# Patient Record
Sex: Female | Born: 1950
Health system: Southern US, Community
[De-identification: ages and names within clinical notes are randomized; demographics above are authoritative.]

## PROBLEM LIST (undated history)

## (undated) DIAGNOSIS — G709 Myoneural disorder, unspecified: Secondary | ICD-10-CM

## (undated) DIAGNOSIS — G629 Polyneuropathy, unspecified: Secondary | ICD-10-CM

## (undated) DIAGNOSIS — Z1211 Encounter for screening for malignant neoplasm of colon: Secondary | ICD-10-CM

## (undated) DIAGNOSIS — C801 Malignant (primary) neoplasm, unspecified: Secondary | ICD-10-CM

## (undated) DIAGNOSIS — L570 Actinic keratosis: Secondary | ICD-10-CM

## (undated) DIAGNOSIS — E538 Deficiency of other specified B group vitamins: Secondary | ICD-10-CM

## (undated) DIAGNOSIS — G4730 Sleep apnea, unspecified: Secondary | ICD-10-CM

## (undated) DIAGNOSIS — G2581 Restless legs syndrome: Secondary | ICD-10-CM

## (undated) DIAGNOSIS — H269 Unspecified cataract: Secondary | ICD-10-CM

## (undated) DIAGNOSIS — D649 Anemia, unspecified: Secondary | ICD-10-CM

## (undated) DIAGNOSIS — Z5189 Encounter for other specified aftercare: Secondary | ICD-10-CM

## (undated) DIAGNOSIS — K2210 Ulcer of esophagus without bleeding: Secondary | ICD-10-CM

## (undated) DIAGNOSIS — Z8639 Personal history of other endocrine, nutritional and metabolic disease: Secondary | ICD-10-CM

## (undated) DIAGNOSIS — E119 Type 2 diabetes mellitus without complications: Secondary | ICD-10-CM

## (undated) DIAGNOSIS — Z800 Family history of malignant neoplasm of digestive organs: Secondary | ICD-10-CM

## (undated) DIAGNOSIS — Z87891 Personal history of nicotine dependence: Secondary | ICD-10-CM

## (undated) DIAGNOSIS — G35D Multiple sclerosis, unspecified: Secondary | ICD-10-CM

## (undated) DIAGNOSIS — E669 Obesity, unspecified: Secondary | ICD-10-CM

## (undated) DIAGNOSIS — M1990 Unspecified osteoarthritis, unspecified site: Secondary | ICD-10-CM

## (undated) DIAGNOSIS — G35 Multiple sclerosis: Secondary | ICD-10-CM

## (undated) DIAGNOSIS — K219 Gastro-esophageal reflux disease without esophagitis: Secondary | ICD-10-CM

## (undated) DIAGNOSIS — R000 Tachycardia, unspecified: Secondary | ICD-10-CM

## (undated) DIAGNOSIS — N6019 Diffuse cystic mastopathy of unspecified breast: Secondary | ICD-10-CM

## (undated) DIAGNOSIS — I10 Essential (primary) hypertension: Secondary | ICD-10-CM

## (undated) DIAGNOSIS — S42409A Unspecified fracture of lower end of unspecified humerus, initial encounter for closed fracture: Secondary | ICD-10-CM

## (undated) HISTORY — DX: Diffuse cystic mastopathy of unspecified breast: N60.19

## (undated) HISTORY — DX: Essential (primary) hypertension: I10

## (undated) HISTORY — DX: Personal history of nicotine dependence: Z87.891

## (undated) HISTORY — DX: Obesity, unspecified: E66.9

## (undated) HISTORY — DX: Encounter for other specified aftercare: Z51.89

## (undated) HISTORY — DX: Encounter for screening for malignant neoplasm of colon: Z12.11

## (undated) HISTORY — DX: Multiple sclerosis: G35

## (undated) HISTORY — PX: BREAST BIOPSY: SHX20

## (undated) HISTORY — DX: Deficiency of other specified B group vitamins: E53.8

## (undated) HISTORY — DX: Unspecified cataract: H26.9

## (undated) HISTORY — PX: TUBAL LIGATION: SHX77

## (undated) HISTORY — PX: PORTA CATH REMOVAL: CATH118286

## (undated) HISTORY — DX: Multiple sclerosis, unspecified: G35.D

## (undated) HISTORY — DX: Unspecified fracture of lower end of unspecified humerus, initial encounter for closed fracture: S42.409A

## (undated) HISTORY — DX: Unspecified osteoarthritis, unspecified site: M19.90

## (undated) HISTORY — DX: Actinic keratosis: L57.0

## (undated) HISTORY — DX: Type 2 diabetes mellitus without complications: E11.9

## (undated) HISTORY — PX: CHOLECYSTECTOMY: SHX55

## (undated) HISTORY — DX: Family history of malignant neoplasm of digestive organs: Z80.0

## (undated) HISTORY — DX: Malignant (primary) neoplasm, unspecified: C80.1

## (undated) HISTORY — DX: Gastro-esophageal reflux disease without esophagitis: K21.9

## (undated) HISTORY — PX: SKIN BIOPSY: SHX1

---

## 1981-11-23 HISTORY — PX: DILATION AND CURETTAGE OF UTERUS: SHX78

## 1987-11-24 HISTORY — PX: POLYPECTOMY: SHX149

## 2004-11-23 HISTORY — PX: WISDOM TOOTH EXTRACTION: SHX21

## 2005-08-18 ENCOUNTER — Ambulatory Visit: Payer: Self-pay | Admitting: General Surgery

## 2006-08-26 ENCOUNTER — Ambulatory Visit: Payer: Self-pay | Admitting: General Surgery

## 2007-08-31 ENCOUNTER — Ambulatory Visit: Payer: Self-pay | Admitting: General Surgery

## 2008-09-04 ENCOUNTER — Ambulatory Visit: Payer: Self-pay | Admitting: General Surgery

## 2008-11-23 HISTORY — PX: COLONOSCOPY: SHX174

## 2009-02-01 ENCOUNTER — Ambulatory Visit: Payer: Self-pay | Admitting: General Surgery

## 2009-09-10 ENCOUNTER — Ambulatory Visit: Payer: Self-pay | Admitting: General Surgery

## 2010-09-16 ENCOUNTER — Ambulatory Visit: Payer: Self-pay | Admitting: General Surgery

## 2010-11-23 DIAGNOSIS — Z800 Family history of malignant neoplasm of digestive organs: Secondary | ICD-10-CM

## 2010-11-23 HISTORY — DX: Family history of malignant neoplasm of digestive organs: Z80.0

## 2011-09-23 ENCOUNTER — Ambulatory Visit: Payer: Self-pay | Admitting: General Surgery

## 2012-09-27 ENCOUNTER — Ambulatory Visit: Payer: Self-pay | Admitting: General Surgery

## 2012-11-23 DIAGNOSIS — C801 Malignant (primary) neoplasm, unspecified: Secondary | ICD-10-CM

## 2012-11-23 HISTORY — DX: Malignant (primary) neoplasm, unspecified: C80.1

## 2013-05-10 ENCOUNTER — Encounter: Payer: Self-pay | Admitting: *Deleted

## 2013-05-10 DIAGNOSIS — Z800 Family history of malignant neoplasm of digestive organs: Secondary | ICD-10-CM | POA: Insufficient documentation

## 2013-09-28 ENCOUNTER — Ambulatory Visit: Payer: Self-pay | Admitting: General Surgery

## 2013-09-29 ENCOUNTER — Encounter: Payer: Self-pay | Admitting: General Surgery

## 2013-09-29 NOTE — Progress Notes (Signed)
Quick Note:  Make sure the additional views are done prior to her office visit ______ 

## 2013-10-09 ENCOUNTER — Telehealth: Payer: Self-pay | Admitting: *Deleted

## 2013-10-09 NOTE — Telephone Encounter (Signed)
Message left on home and cell numbers to call the office. We also tried to reach patient at work but they state she has this week off from work.   Patient's appointment needs to be moved after she has her additional views completed at [facility]. Additional views are presently scheduled for 10-30-13.

## 2013-10-11 ENCOUNTER — Telehealth: Payer: Self-pay | Admitting: *Deleted

## 2013-10-11 NOTE — Telephone Encounter (Signed)
Message left again on home and cell numbers for patient to call the office.

## 2013-10-16 ENCOUNTER — Ambulatory Visit: Payer: Self-pay | Admitting: General Surgery

## 2013-10-30 ENCOUNTER — Ambulatory Visit: Payer: Self-pay | Admitting: General Surgery

## 2013-10-30 ENCOUNTER — Encounter: Payer: Self-pay | Admitting: General Surgery

## 2013-11-07 ENCOUNTER — Ambulatory Visit (INDEPENDENT_AMBULATORY_CARE_PROVIDER_SITE_OTHER): Payer: 59 | Admitting: General Surgery

## 2013-11-07 ENCOUNTER — Other Ambulatory Visit: Payer: Self-pay | Admitting: General Surgery

## 2013-11-07 ENCOUNTER — Other Ambulatory Visit: Payer: 59

## 2013-11-07 ENCOUNTER — Encounter: Payer: Self-pay | Admitting: General Surgery

## 2013-11-07 VITALS — BP 116/70 | HR 76 | Resp 14 | Ht 64.5 in | Wt 184.0 lb

## 2013-11-07 DIAGNOSIS — N63 Unspecified lump in unspecified breast: Secondary | ICD-10-CM

## 2013-11-07 DIAGNOSIS — R920 Mammographic microcalcification found on diagnostic imaging of breast: Secondary | ICD-10-CM

## 2013-11-07 HISTORY — PX: BREAST BIOPSY: SHX20

## 2013-11-07 NOTE — Progress Notes (Signed)
Patient ID: Cassidy Norris, female   DOB: 11/14/1951, 62 y.o.   MRN: [id number]  Chief Complaint  Patient presents with  . Follow-up    mammogram    HPI Cassidy Norris is a 62 y.o. female.  who presents for her follow up breast evaluation. The most recent mammogram and ultrasound was done on 10-30-13. Patient does perform regular self breast checks and gets regular mammograms done. The patient denies any problems with the breasts.    HPI  Past Medical History  Diagnosis Date  . Personal history of tobacco use, presenting hazards to health   . Multiple sclerosis   . Hypertension   . Diffuse cystic mastopathy   . Family history of malignant neoplasm of gastrointestinal tract 2012  . Obesity, unspecified   . Special screening for malignant neoplasms, colon     Past Surgical History  Procedure Laterality Date  . Breast biopsy Right 1973,2001  . Wisdom tooth extraction  2006  . Polypectomy  1989  . Dilation and curettage of uterus  1983  . Colonoscopy  2010    Dr. Evette Cristal  . Tubal ligation      Family History  Problem Relation Age of Onset  . Cancer Mother     lung  . Cancer Father     colon    Social History History  Substance Use Topics  . Smoking status: Current Every Day Smoker -- 1.00 packs/day for 30 years    Types: Cigarettes  . Smokeless tobacco: Not on file  . Alcohol Use: No    No Known Allergies  Current Outpatient Prescriptions  Medication Sig Dispense Refill  . diphenhydrAMINE (SOMINEX) 25 MG tablet Take 25 mg by mouth at bedtime as needed for sleep.      . Melatonin 3 MG TABS Take by mouth daily.      . traZODone (DESYREL) 50 MG tablet Take 50 mg by mouth at bedtime.       No current facility-administered medications for this visit.    Review of Systems Review of Systems  Constitutional: Negative.   Respiratory: Negative.   Cardiovascular: Negative.     Blood pressure 116/70, pulse 76, resp. rate 14, height 5' 4.5" (1.638 m), weight  184 lb (83.462 kg).  Physical Exam Physical Exam  Constitutional: She is oriented to person, place, and time. She appears well-developed and well-nourished.  Eyes: Conjunctivae are normal. No scleral icterus.  Neck: No thyromegaly present.  Cardiovascular: Normal rate, regular rhythm and normal heart sounds.   No murmur heard. Pulmonary/Chest: Effort normal and breath sounds normal. Right breast exhibits no inverted nipple, no mass, no nipple discharge, no skin change and no tenderness. Left breast exhibits no inverted nipple, no mass, no nipple discharge, no skin change and no tenderness.  Right breast focal 1cm ill defined thickening at 10 o'clock near the periphery.   Lymphadenopathy:    She has no cervical adenopathy.    She has no axillary adenopathy.  Neurological: She is alert and oriented to person, place, and time.  Skin: Skin is warm and dry.    Data Reviewed Mammogram shows cluster of calcifications in left breast at 6 ocl and in uoq- appear they are stable from before. Also a ned faint nodule seen lateral right breast-US shows a vagu=ue spiculated area with some shadowing. This seems to correspond with the thickening palpated in right breast today.  Assessment    Findings as noted above. Core biopsy of right breast mass recommended and  completed today.     Plan    Await path report.        Jams Trickett G 11/07/2013, 7:37 PM

## 2013-11-07 NOTE — Patient Instructions (Signed)

## 2013-11-10 ENCOUNTER — Telehealth: Payer: Self-pay | Admitting: *Deleted

## 2013-11-10 DIAGNOSIS — R920 Mammographic microcalcification found on diagnostic imaging of breast: Secondary | ICD-10-CM

## 2013-11-10 NOTE — Telephone Encounter (Signed)
Patient has been scheduled for a left breast stereotactic biopsy x 2 at [facility] for 11-27-13 at 3 pm. She will check-in at the [facility] at 2:30 pm. This patient is aware of date, time, and instructions. Patient verbalizes understanding.

## 2013-11-10 NOTE — Telephone Encounter (Signed)
Message copied by Nicholes Mango on Fri Nov 10, 2013 11:58 AM ------      Message from: Kieth Brightly      Created: Fri Nov 10, 2013 11:21 AM       Pt advised of path report by phone this am. Briefly explained nature of cancer, pending reports on ER/PR and her 2 neu.       Feel she needs to have left breast microcalcs   biopsied. Pt is agreeable. ------

## 2013-11-17 LAB — PATHOLOGY

## 2013-11-21 ENCOUNTER — Ambulatory Visit (INDEPENDENT_AMBULATORY_CARE_PROVIDER_SITE_OTHER): Payer: 59 | Admitting: General Surgery

## 2013-11-21 ENCOUNTER — Encounter: Payer: Self-pay | Admitting: General Surgery

## 2013-11-21 ENCOUNTER — Other Ambulatory Visit: Payer: Self-pay | Admitting: General Surgery

## 2013-11-21 VITALS — BP 132/82 | HR 82 | Resp 14 | Ht 64.5 in | Wt 186.0 lb

## 2013-11-21 DIAGNOSIS — C50919 Malignant neoplasm of unspecified site of unspecified female breast: Secondary | ICD-10-CM

## 2013-11-21 DIAGNOSIS — R920 Mammographic microcalcification found on diagnostic imaging of breast: Secondary | ICD-10-CM

## 2013-11-21 NOTE — Progress Notes (Signed)
This is a 62 year old female here today to discuss having a left breast stereotatic biopsy. Her recent right breast biopsy was invasive mammary carcinoma.  Er/PR are pos and Her 2 is negative. She has 2 clusters of microcalcification in left breast. Stereotactic biopsy explained to her. Pt is agreeable.   Patient to have the following labs drawn today at [facility] lab: CBC, Met C, and CA 27-29.   Stereotactic biopsy is scheduled for 11-27-13 at [facility].

## 2013-11-21 NOTE — Patient Instructions (Addendum)
Stereotactic Breast Biopsy   A stereotactic breast biopsy is a procedure in which mammography is used in the collection of a sample of breast tissue. Mammography is a type of X-ray exam of the breasts that produces an image called a mammogram. The mammogram allows your health care provider to precisely locate the area of the breast from which a tissue sample will be taken. The tissue is then examined under a microscope to see if cancerous cells are present. A breast biopsy is done when:   · A lump, abnormality, or mass is seen in the breast on a breast X-ray (mammogram).    · Small calcium deposits (calcifications) are seen in the breast.    · The shape or appearance of the breasts changes.    · The shape or appearance of the nipples changes. You may have unusual or bloody discharge coming from the nipples, or you may have crusting, retraction, or dimpling of the nipples.  A breast biopsy can indicate if you need surgery or other treatment.    LET YOUR HEALTH CARE PROVIDER KNOW ABOUT:  · Any allergies you have.  · All medicines you are taking, including vitamins, herbs, eye drops, creams, and over-the-counter medicines.  · Previous problems you or members of your family have had with the use of anesthetics.  · Any blood disorders you have.  · Previous surgeries you have had.  · Medical conditions you have.  RISKS AND COMPLICATIONS  Generally, stereotactic breast biopsy is a safe procedure. However, as with any procedure, complications can occur. Possible complications include:  · Infection at the needle-insertion site.    · Bleeding or bruising after surgery.  · The breast may become altered or deformed as a result of the procedure.  · The needle may go through the chest wall into the lung area.    BEFORE THE PROCEDURE  · Wear a supportive bra to the procedure.  · You will be asked to remove jewelry, dentures, eyeglasses, metal objects, or clothing that might interfere with the X-ray images. You may want to leave  some of these objects at home.  · Arrange for someone to drive you home after the procedure if desired.  PROCEDURE   A stereotactic breast biopsy is done while you are awake. During the procedure, relax as much as possible. Let your health care provider know if you are uncomfortable, anxious, or in pain. Usually, the only discomfort felt during the procedure is caused by staying in one position for the length of the procedure. This discomfort can be reduced by carefully placed cushions.  Most of the time the biopsy is done using a table with openings on it. You will be asked to lie facedown on the table and place your breasts through the openings. Your breast is compressed between metal plates to get good X-ray images. Your skin will be cleaned, and a numbing medicine (local anesthetic) will be injected. A small cut (incision) will be made in your breast. The tip of the biopsy needle will be directed through the incision. Several small pieces of suspicious tissue will be taken. Then, a final set of X-ray images will be obtained. If they show that the suspicious tissue has been mostly or completely removed, a small clip will be left at the biopsy site. This is done so that the biopsy site can be easily located if the results of the biopsy show that the tissue is cancerous.   After the procedure, the incision will be stitched (sutured) or taped and covered   stereotactic breast biopsy can take 30 minutes or more. AFTER THE PROCEDURE  If you are doing well and have no problems, you will be allowed to go home.  Document Released: 08/08/2003 Document Revised: 07/12/2013 Document Reviewed: 06/08/2013 [facility] Patient Information 2014 [place], [place].  Patient to have labs drawn today.   Stereotactic biopsy is scheduled for 11-27-13 at [facility].

## 2013-11-22 ENCOUNTER — Encounter: Payer: Self-pay | Admitting: General Surgery

## 2013-11-22 LAB — CBC WITH DIFFERENTIAL
Basophils Absolute: 0 10*3/uL (ref 0.0–0.2)
Eosinophils Absolute: 0.2 10*3/uL (ref 0.0–0.4)
Immature Grans (Abs): 0 10*3/uL (ref 0.0–0.1)
Immature Granulocytes: 0 %
Lymphs: 35 %
MCH: 31.7 pg (ref 26.6–33.0)
MCHC: 35.2 g/dL (ref 31.5–35.7)
Monocytes Absolute: 0.8 10*3/uL (ref 0.1–0.9)
Neutrophils Relative %: 55 %
Platelets: 238 10*3/uL (ref 150–379)
RDW: 13.4 % (ref 12.3–15.4)
WBC: 10.4 10*3/uL (ref 3.4–10.8)

## 2013-11-22 LAB — COMPREHENSIVE METABOLIC PANEL
ALT: 21 IU/L (ref 0–32)
AST: 21 IU/L (ref 0–40)
Albumin/Globulin Ratio: 1.9 (ref 1.1–2.5)
Alkaline Phosphatase: 73 IU/L (ref 39–117)
BUN/Creatinine Ratio: 17 (ref 11–26)
CO2: 24 mmol/L (ref 18–29)
Calcium: 10 mg/dL (ref 8.6–10.2)
Chloride: 102 mmol/L (ref 97–108)
GFR calc non Af Amer: 94 mL/min/{1.73_m2} (ref >59)
Glucose: 101 mg/dL — ABNORMAL HIGH (ref 65–99)
Potassium: 4.3 mmol/L (ref 3.5–5.2)
Sodium: 142 mmol/L (ref 134–144)

## 2013-11-22 LAB — CANCER ANTIGEN 27.29: CA 27.29: 24.9 U/mL (ref 0.0–38.6)

## 2013-11-23 HISTORY — PX: PORTACATH PLACEMENT: SHX2246

## 2013-11-27 ENCOUNTER — Ambulatory Visit: Payer: Self-pay | Admitting: General Surgery

## 2013-11-27 DIAGNOSIS — R920 Mammographic microcalcification found on diagnostic imaging of breast: Secondary | ICD-10-CM

## 2013-11-27 HISTORY — PX: BREAST BIOPSY: SHX20

## 2013-11-29 ENCOUNTER — Encounter: Payer: Self-pay | Admitting: General Surgery

## 2013-11-29 ENCOUNTER — Ambulatory Visit (INDEPENDENT_AMBULATORY_CARE_PROVIDER_SITE_OTHER): Payer: 59 | Admitting: General Surgery

## 2013-11-29 VITALS — BP 164/82 | HR 94 | Resp 16 | Ht 64.5 in | Wt 186.0 lb

## 2013-11-29 DIAGNOSIS — C50912 Malignant neoplasm of unspecified site of left female breast: Secondary | ICD-10-CM

## 2013-11-29 DIAGNOSIS — C50911 Malignant neoplasm of unspecified site of right female breast: Secondary | ICD-10-CM

## 2013-11-29 DIAGNOSIS — C50919 Malignant neoplasm of unspecified site of unspecified female breast: Secondary | ICD-10-CM

## 2013-11-29 DIAGNOSIS — Z853 Personal history of malignant neoplasm of breast: Secondary | ICD-10-CM | POA: Insufficient documentation

## 2013-11-29 NOTE — Patient Instructions (Signed)
Will call regarding oncology evaluation

## 2013-11-29 NOTE — Progress Notes (Signed)
Patient here for discussion of left  Breast stereo biopsy results. Dressings remain intact left breast.  Minimal bruising noted. Path: the focus of microcalcd in uoq left breast-invasive lobular CA. 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 Pt advised on these reports. The ER/PR and Her 2 results on left breast lobular cancer will be available in 2-3 days. Pt has one paternal aunt that had breast cancer. Multiple members on paternal side with colon, stomach and other cancers. Pt has multi focal breast CA, bilateral. Surgical options-lumpectomy/mastectomy, SN biopsy all outlined.  She may benefit with genetic testing. Will get oncologic consultation also. Surgical decision after oncologic consultation. Pt is agreeable to the plan. Will schedule oncology eval.

## 2013-11-30 ENCOUNTER — Telehealth: Payer: Self-pay | Admitting: *Deleted

## 2013-11-30 ENCOUNTER — Encounter: Payer: Self-pay | Admitting: General Surgery

## 2013-11-30 DIAGNOSIS — C50919 Malignant neoplasm of unspecified site of unspecified female breast: Secondary | ICD-10-CM

## 2013-11-30 NOTE — Telephone Encounter (Signed)
Patient has been scheduled for an appointment with Dr. Oliva Bustard at the [facility] for Monday, 12-04-13 at 9 am (arrive 8:45 am). She is aware of date, time, and instructions.

## 2013-12-04 ENCOUNTER — Ambulatory Visit: Payer: 59

## 2013-12-04 ENCOUNTER — Encounter: Payer: Self-pay | Admitting: General Surgery

## 2013-12-04 ENCOUNTER — Ambulatory Visit: Payer: Self-pay | Admitting: Oncology

## 2013-12-04 LAB — PATHOLOGY REPORT

## 2013-12-06 ENCOUNTER — Encounter: Payer: Self-pay | Admitting: General Surgery

## 2013-12-06 ENCOUNTER — Telehealth: Payer: Self-pay | Admitting: *Deleted

## 2013-12-06 NOTE — Telephone Encounter (Signed)
fmla papers

## 2013-12-11 ENCOUNTER — Telehealth: Payer: Self-pay | Admitting: *Deleted

## 2013-12-11 ENCOUNTER — Other Ambulatory Visit: Payer: Self-pay | Admitting: Oncology

## 2013-12-11 DIAGNOSIS — C50912 Malignant neoplasm of unspecified site of left female breast: Principal | ICD-10-CM

## 2013-12-11 DIAGNOSIS — C50911 Malignant neoplasm of unspecified site of right female breast: Secondary | ICD-10-CM

## 2013-12-11 NOTE — Telephone Encounter (Signed)
Pt called and said that she just wanted to let you know that she got her MRI schedule for her breast at [facility] for this Thursday 12/14/13, if you have any questions or need to talk to her about anything she said to call her at [phone]

## 2013-12-11 NOTE — Telephone Encounter (Signed)
PT CALLED AND WANTED TO KNOW THE RESULTS FOR HER LT BREAST BX THAT WAS DONE ON 11/27/13

## 2013-12-13 ENCOUNTER — Telehealth: Payer: Self-pay | Admitting: *Deleted

## 2013-12-13 NOTE — Telephone Encounter (Signed)
MRI is for 12-14-13 and genetic test was sent

## 2013-12-14 ENCOUNTER — Ambulatory Visit
Admission: RE | Admit: 2013-12-14 | Discharge: 2013-12-14 | Disposition: A | Discharge status: Home / Self Care | Payer: 59 | Source: Ambulatory Visit | Attending: Oncology | Admitting: Oncology

## 2013-12-14 DIAGNOSIS — C50911 Malignant neoplasm of unspecified site of right female breast: Secondary | ICD-10-CM

## 2013-12-14 DIAGNOSIS — C50912 Malignant neoplasm of unspecified site of left female breast: Principal | ICD-10-CM

## 2013-12-14 MED ORDER — GADOBENATE DIMEGLUMINE 529 MG/ML IV SOLN
16.0000 mL | Freq: Once | INTRAVENOUS | Status: AC | PRN
  Administered 2013-12-14: 16 mL via INTRAVENOUS

## 2013-12-15 ENCOUNTER — Telehealth: Payer: Self-pay | Admitting: *Deleted

## 2013-12-15 NOTE — Telephone Encounter (Signed)
Pt called and wanted to talk to you about her FMLA papers, she has some questions that she wants to ask you.

## 2013-12-21 ENCOUNTER — Ambulatory Visit: Payer: 59

## 2013-12-21 ENCOUNTER — Ambulatory Visit (INDEPENDENT_AMBULATORY_CARE_PROVIDER_SITE_OTHER): Payer: 59 | Admitting: General Surgery

## 2013-12-21 ENCOUNTER — Encounter: Payer: Self-pay | Admitting: General Surgery

## 2013-12-21 VITALS — BP 124/70 | HR 80 | Resp 12 | Ht 64.0 in | Wt 185.0 lb

## 2013-12-21 DIAGNOSIS — C50919 Malignant neoplasm of unspecified site of unspecified female breast: Secondary | ICD-10-CM

## 2013-12-21 DIAGNOSIS — C50911 Malignant neoplasm of unspecified site of right female breast: Secondary | ICD-10-CM

## 2013-12-21 DIAGNOSIS — C50912 Malignant neoplasm of unspecified site of left female breast: Secondary | ICD-10-CM

## 2013-12-21 NOTE — Progress Notes (Signed)
Patient ID: Cassidy Norris, female   DOB: 05/14/1951, 62 y.o.   MRN: [id number]  Chief Complaint  Patient presents with  . Follow-up    rioght breast ultrasound    HPI Cassidy Norris is a 62 y.o. female here today for an right breast ultrasound. Pt has 3 different foci of cancer -2 on left and one on left, biopsy proven. MRI breast reveals nodules in anterior aspect of right breast.  HPI  Past Medical History  Diagnosis Date  . Personal history of tobacco use, presenting hazards to health   . Multiple sclerosis   . Hypertension   . Diffuse cystic mastopathy   . Family history of malignant neoplasm of gastrointestinal tract 2012  . Obesity, unspecified   . Special screening for malignant neoplasms, colon     Past Surgical History  Procedure Laterality Date  . Wisdom tooth extraction  2006  . Polypectomy  1989  . Dilation and curettage of uterus  1983  . Colonoscopy  2010    Dr. Sankar  . Tubal ligation    . Breast biopsy Right 1973,2001  . Breast biopsy Right 11-07-13    INVASIVE MAMMARY CARCINOMA , ER/PR positive Her2 negative  . Breast biopsy Left 11-27-13    Family History  Problem Relation Age of Onset  . Cancer Mother     lung  . Cancer Father     colon    Social History History  Substance Use Topics  . Smoking status: Former Smoker -- 1.00 packs/day for 30 years    Types: Cigarettes    Quit date: 12/11/2013  . Smokeless tobacco: Never Used  . Alcohol Use: No    No Known Allergies  Current Outpatient Prescriptions  Medication Sig Dispense Refill  . acetaminophen (TYLENOL) 325 MG tablet Take 650 mg by mouth every 6 (six) hours as needed.      . Ascorbic Acid (VITAMIN C) 100 MG tablet Take 100 mg by mouth daily.      . diphenhydrAMINE (SOMINEX) 25 MG tablet Take 25 mg by mouth at bedtime as needed for sleep.      . Melatonin 3 MG TABS Take by mouth daily.      . traZODone (DESYREL) 50 MG tablet Take 50 mg by mouth at bedtime.       No current  facility-administered medications for this visit.    Review of Systems Review of Systems  Constitutional: Negative.   Respiratory: Negative.   Cardiovascular: Negative.     Blood pressure 124/70, pulse 80, resp. rate 12, height 5' 4" (1.626 m), weight 185 lb (83.915 kg).  Physical Exam Physical Exam Breast exam reveals no palpable findings.  Data Reviewed MRI was reviewed. US right breast today shows there are 2 hypoechoic nodules-one at 12 ocl near nipple with suspicious findings. Another at 11 ocl also near nipple with more benign features.  No other correlates to MRI findings noted.   Assessment    Multifocal bilateral breast cancer. Discussed fully with pt. Feel bil mastectomy with SN biopsy is most appropriate in this setting. Reconstruction also discussed. Pt is agreeable.      Plan    Will get Plastic surgery consult- Dr. Coan.        SANKAR,SEEPLAPUTHUR G 12/25/2013, 7:55 AM    

## 2013-12-21 NOTE — Patient Instructions (Addendum)
Mastectomy, With or Without Reconstruction °Mastectomy (removal of the breast) is a procedure most commonly used to treat cancer (tumor) of the breast. Different procedures are available for treatment. This depends on the stage of the tumor (abnormal growths). Discuss this with your caregiver, surgeon (a specialist for performing operations such as this), or oncologist (someone specialized in the treatment of cancer). With proper information, you can decide which treatment is best for you. Although the sound of the word cancer is frightening to all of us, the new treatments and medications can be a source of reassurance and comfort. If there are things you are worried about, discuss them with your caregiver. He or she can help comfort you and your family. Some of the different procedures for treating breast cancer are: °· Radical (extensive) mastectomy. This is an operation used to remove the entire breast, the muscles under the breast, and all of the glands (lymph nodes) under the arm. With all of the new treatments available for cancer of the breast, this procedure has become less common. °· Modified radical mastectomy. This is a similar operation to the radical mastectomy described above. In the modified radical mastectomy, the muscles of the chest wall are not removed unless one of the lessor muscles is removed. One of the lessor muscles may be removed to allow better removal of the lymph nodes. The axillary lymph nodes are also removed. Rarely, during an axillary node dissection nerves to this area are damaged. Radiation therapy is then often used to the area following this surgery. °· A total mastectomy also known as a complete or simple mastectomy. It involves removal of only the breast. The lymph nodes and the muscles are left in place. °· In a lumpectomy, the lump is removed from the breast. This is the simplest form of surgical treatment. A sentinel lymph node biopsy may also be done. Additional treatment  may be required. °RISKS AND COMPLICATIONS °The main problems that follow removal of the breast include: °· Infection (germs start growing in the wound). This can usually be treated with antibiotics (medications that kill germs). °· Lymphedema. This means the arm on the side of the breast that was operated on swells because the lymph (tissue fluid) cannot follow the main channels back into the body. This only occurs when the lymph nodes have had to be removed under the arm. °· There may be some areas of numbness to the upper arm and around the incision (cut by the surgeon) in the breast. This happens because of the cutting of or damage to some of the nerves in the area. This is most often unavoidable. °· There may be difficulty moving the arm in a full range of motion (moving in all directions) following surgery. This usually improves with time following use and exercise. °· Recurrence of breast cancer may happen with the very best of surgery and follow up treatment. Sometimes small cancer cells that cannot be seen with the naked eye have already spread at the time of surgery. When this happens other treatment is available. This treatment may be radiation, medications or a combination of both. °RECONSTRUCTION °Reconstruction of the breast may be done immediately if there is not going to be post-operative radiation. This surgery is done for cosmetic (improve appearance) purposes to improve the physical appearance after the operation. This may be done in two ways: °· It can be done using a saline filled prosthetic (an artificial breast which is filled with salt water). Silicone breast implants are now   re-approved by the FDA and are being commonly used.  Reconstruction can be done using the body's own muscle/fat/skin. Your caregiver will discuss your options with you. Depending upon your needs or choice, together you will be able to determine which procedure is best for you. Document Released: 08/04/2001 Document  Revised: 08/03/2012 Document Reviewed: 03/27/2008 [facility] Patient Information 2014 [place].

## 2013-12-24 ENCOUNTER — Ambulatory Visit: Payer: Self-pay | Admitting: Oncology

## 2013-12-25 ENCOUNTER — Telehealth: Payer: Self-pay | Admitting: *Deleted

## 2013-12-25 ENCOUNTER — Encounter: Payer: Self-pay | Admitting: General Surgery

## 2013-12-25 NOTE — Telephone Encounter (Signed)
Records forwarded to Dr. Edwin Dada office and received per April. Their office will contact patient with a day and time for consultation appointment.   Patient aware to call the office once date arranged so we can inform Dr. Jamal Collin.

## 2013-12-26 ENCOUNTER — Telehealth: Payer: Self-pay | Admitting: *Deleted

## 2013-12-26 ENCOUNTER — Telehealth: Payer: Self-pay | Admitting: General Surgery

## 2013-12-26 NOTE — Telephone Encounter (Signed)
Patient called Dr. Edwin Dada office and reports that it will be next week before their office will contact her with an appointment date and time. Dr. Jamal Collin notified accordingly.  This patient also states that she is not taking vitamin C at this time but is taking vitamin B 12 5,000 mcg one po daily. This will be corrected on her medication list.

## 2013-12-26 NOTE — Telephone Encounter (Signed)
Pt called to to state that she does not want to delay the surgery for her bil breast cancer. She feels reconstruction  Is not essential to her at present.  As per prior discussion will schedule bilateral mastectomy with SN biopsy. Will see pt few days before surgery to discuss surgical details.

## 2013-12-26 NOTE — Telephone Encounter (Signed)
Patient's surgery has been scheduled for 01-09-14 at [facility]. A pre-op visit has also been arranged. She is aware of date, time, and instructions. Paperwork has been mailed to the patient. This patient was instructed to call should she have further questions.

## 2013-12-28 ENCOUNTER — Telehealth: Payer: Self-pay | Admitting: *Deleted

## 2013-12-28 ENCOUNTER — Other Ambulatory Visit: Payer: Self-pay | Admitting: General Surgery

## 2013-12-28 DIAGNOSIS — C50912 Malignant neoplasm of unspecified site of left female breast: Secondary | ICD-10-CM

## 2013-12-28 DIAGNOSIS — C50911 Malignant neoplasm of unspecified site of right female breast: Secondary | ICD-10-CM

## 2013-12-28 NOTE — Telephone Encounter (Signed)
Per Dr. Edwin Dada office, they have reviewed records on this patient and Dr. Tula Nakayama does not feel that she is a candidate for immediate reconstruction after bilateral mastectomy. They will be more than happy to see the patient but would like to wait until after surgery/treatment (if needed) is completed. Due to the fact that the patient quit smoking less than one month ago, they feel this is not long enough for the benefit of healing after immediate reconstruction.

## 2013-12-28 NOTE — Telephone Encounter (Signed)
Please call Cassidy Norris and advise her on what Dr. Tula Nakayama said. Cassidy Norris can decide if she wants reconstruction after all treatment completed.

## 2013-12-28 NOTE — Telephone Encounter (Signed)
Patient notified as instructed. At this point she is unsure if she will want to have reconstruction later down the road since this cannot be immediately after bilateral mastectomy. This patient does not wish to meet with Dr. Tula Nakayama in the near future at present. She was instructed to let us know if she changes her mind and we will make arrangements accordingly.

## 2014-01-01 ENCOUNTER — Encounter: Payer: Self-pay | Admitting: General Surgery

## 2014-01-03 ENCOUNTER — Encounter: Payer: Self-pay | Admitting: General Surgery

## 2014-01-03 ENCOUNTER — Ambulatory Visit: Payer: Self-pay | Admitting: General Surgery

## 2014-01-04 ENCOUNTER — Ambulatory Visit (INDEPENDENT_AMBULATORY_CARE_PROVIDER_SITE_OTHER): Payer: 59 | Admitting: General Surgery

## 2014-01-04 ENCOUNTER — Encounter: Payer: Self-pay | Admitting: General Surgery

## 2014-01-04 VITALS — BP 130/74 | HR 86 | Resp 14 | Ht 64.5 in | Wt 184.0 lb

## 2014-01-04 DIAGNOSIS — C50912 Malignant neoplasm of unspecified site of left female breast: Secondary | ICD-10-CM

## 2014-01-04 DIAGNOSIS — C50911 Malignant neoplasm of unspecified site of right female breast: Secondary | ICD-10-CM

## 2014-01-04 DIAGNOSIS — C50919 Malignant neoplasm of unspecified site of unspecified female breast: Secondary | ICD-10-CM

## 2014-01-04 NOTE — Patient Instructions (Addendum)
The patient is aware to call back for any questions or concerns.Total or Modified Radical Mastectomy  Care After Refer to this sheet in the next few weeks. These instructions provide you with information on caring for yourself after your procedure. Your caregiver may also give you more specific instructions. Your treatment has been planned according to current medical practices, but problems sometimes occur. Call your caregiver if you have any problems or questions after your procedure. ACTIVITY  Your caregiver will advise you when you may resume strenuous activities, driving, and sports.  After the drain(s) are removed, you may do light housework. Avoid heavy lifting, carrying, or pushing. You should not be lifting anything heavier than 5 lbs.  Take frequent rest periods. You may tire more easily than usual.  Always rest and elevate the arm affected by your surgery for a period of time equal to your activity time.  Continue doing the exercises given to you by the physical therapist/occupational therapist even after full range of motion has returned. The amount of time this takes will vary from person to person.  After normal range of motion has returned, some stiffness and soreness may persist for 2-3 months. This is normal and will subside.  Begin sports or strenuous activities in moderation. This will give you a chance to rebuild your endurance. Continue to be cautious of heavy lifting or carrying (no more than 10 lbs.) with your affected arm.  You may return to work as recommended by your caregiver. NUTRITION  You may resume your normal diet.  Make sure you drink plenty of fluids (6-8 glasses a day).  Eat a well-balanced diet. Including daily portions of food from government recommended food groups:  Grains.  Vegetables.  Fruits.  Milk.  Meat & beans.  Oils. Visit http://james-garner.info/ for more information HYGIENE  You may wash your hair.  If your incision (cut from  surgery) is closed, you may shower or tub bathe, unless instructed otherwise by your doctor. FEVER  If you feel feverish or have shaking chills, take your temperature. If your temperature is 102 F (38.9 C) or above, call your caregiver. The fever may mean there is an infection.  If you call early, infection can be treated with antibiotics and hospitalization may be avoided. PAIN CONTROL  Mild discomfort may occur.  You may need to take an over-the-counter pain medication or a medication prescribed by your caregiver.  Call your caregiver if you experience increased pain. INCISION CARE  Check your incision daily for increased redness, drainage, swelling, or separation of skin.  Call your caregiver if any of the above are noted. ARM AND HAND CARE  If the lymph nodes under your arm were removed with a modified radical mastectomy, there may be a greater tendency for the arm to swell.  Try to avoid having blood pressures taken, blood drawn, or injections given in the affected arm. This is the arm on the same side as the surgery.  Use hand lotion to soften cuticles instead of cutting them to avoid cutting yourself.  Be careful when shaving your under arms. Use an electric shaver if possible. You may use a deodorant after the incision has completely healed. Until then, clean under your arms with hydrogen peroxide.  Use reasonable precaution when cooking, sewing, and gardening to avoid burning or needle or thorn pricks.  Do not weigh your arm straight down with a package or your purse.  Follow the exercises and instructions given to you by the physical therapist/occupational  therapist and your caregiver. FOLLOW-UP APPOINTMENT Call your caregiver for a follow-up appointment as directed. PROSTHESIS INFORMATION Wear your temporary prosthesis (artificial breast) until your caregiver gives you permission to purchase a permanent one. This will depend upon your rate of healing. We suggest you  also wait until you are physically and emotionally ready to shop for one. The suitability depends on several individual factors. We do not endorse any particular prosthesis, but suggest you try several until you are satisfied with appearance and fit. A list of stores may be obtained from your local [place] at [employer] or 1-800-ACS-2345 (1-416 564 1333). A permanent prosthesis is medically necessary to restore balance. It is also income tax deductible. Be sure all receipts are marked "surgical". It is not essential to purchase a bra. You may sew a pocket into your regular bra. Note: Remember to take all of your medical insurance information with you when shopping for your prosthesis. SELECTING A PROSTHESIS FITTER You may want to ask the following questions when selecting a fitter:  What styles and brands of forms are carried in stock?  How long have the forms been on the market and have there been any problems with them?  Why would one form be better than another?  How long should a particular form last?  May I wear the form for a trial period without obligation?  Do the forms require a prosthetic bra? If so, what is the price range? Must I always wear that style?  If alterations to the bra are necessary, can they be done at this location or be sent out?  Will I be charged for alterations?  Will I receive suggestions on how to alter my own wardrobe, if necessary?  Will you special order forms or bras if necessary?  Are fitters always available to meet my needs?  What kinds of garments should be worn for the fitting?  Are lounge wear, swim wear, and accessories available?  If I have insurance coverage or Medicare, will you suggest ways for processing the paper work?  Do you keep complete records so that mail reordering is possible?  How are warranty claims handled if I have a problem with the form? Document Released: 07/02/2004 Document Revised: 02/01/2012  Document Reviewed: 03/06/2008 [facility] Patient Information 2014 [place], [place].

## 2014-01-04 NOTE — Progress Notes (Signed)
Patient ID: Cassidy Norris, female   DOB: 05-10-1951, 63 y.o.   MRN: [id number]  Chief Complaint  Patient presents with  . Pre-op Exam    bilateral mastectomy    HPI Cassidy Norris is a 63 y.o. female.  Here today for her preop appointment, bilateral mastectomy and SN biopsy scheduled for 01-09-14. She is here today with her sisters. HPI  Past Medical History  Diagnosis Date  . Personal history of tobacco use, presenting hazards to health   . Multiple sclerosis   . Hypertension   . Diffuse cystic mastopathy   . Family history of malignant neoplasm of gastrointestinal tract 2012  . Obesity, unspecified   . Special screening for malignant neoplasms, colon     Past Surgical History  Procedure Laterality Date  . Wisdom tooth extraction  2006  . Polypectomy  1989  . Dilation and curettage of uterus  1983  . Colonoscopy  2010    Dr. Jamal Collin  . Tubal ligation    . Breast biopsy Right 1973,2001  . Breast biopsy Right 11-07-13    INVASIVE MAMMARY CARCINOMA , ER/PR positive Her2 negative  . Breast biopsy Left 11-27-13    invasive lobular and DCIS    Family History  Problem Relation Age of Onset  . Cancer Mother     lung  . Cancer Father     colon    Social History History  Substance Use Topics  . Smoking status: Former Smoker -- 1.00 packs/day for 30 years    Types: Cigarettes    Quit date: 12/11/2013  . Smokeless tobacco: Never Used  . Alcohol Use: No    No Known Allergies  Current Outpatient Prescriptions  Medication Sig Dispense Refill  . acetaminophen (TYLENOL) 325 MG tablet Take 650 mg by mouth every 6 (six) hours as needed.      . Cyanocobalamin (VITAMIN B-12) 5000 MCG LOZG Take by mouth daily.      . diphenhydrAMINE (SOMINEX) 25 MG tablet Take 25 mg by mouth at bedtime as needed for sleep.      Marland Kitchen LORazepam (ATIVAN) 0.5 MG tablet Take 0.5 mg by mouth as needed.       . Melatonin 3 MG TABS Take by mouth daily.      . traZODone (DESYREL) 50 MG tablet Take 50  mg by mouth at bedtime.       No current facility-administered medications for this visit.    Review of Systems Review of Systems  Constitutional: Negative.   Respiratory: Negative.   Cardiovascular: Negative.     Blood pressure 130/74, pulse 86, resp. rate 14, height 5' 4.5" (1.638 m), weight 184 lb (83.462 kg).  Physical Exam Physical Exam  Constitutional: She is oriented to person, place, and time. She appears well-developed and well-nourished.  Eyes: Conjunctivae are normal. No scleral icterus.  Neck: Neck supple.  Cardiovascular: Normal rate, regular rhythm and normal heart sounds.   Pulmonary/Chest: Effort normal and breath sounds normal.  Breast exam with no palpable findings. Mild ecchymosis from biopsy bilateral.  Abdominal: Soft. Bowel sounds are normal. There is no hepatosplenomegaly. There is no tenderness.  Lymphadenopathy:    She has no cervical adenopathy.    She has no axillary adenopathy.  Neurological: She is alert and oriented to person, place, and time.  Skin: Skin is warm and dry.    Data Reviewed Prior notes and imaging studies.  Assessment    Bilateral multifocal brreast CA, ER/PR pos, Her 2 negative.  Plan    As per prior discussion patient was agreeable to bilateral total mastectomy with sentinel node biopsy. Today I explained procedure in detail to the patient with risks and benefits explained. Use of drains, potential for axillary dissection if   gross involvement of lymph nodes identified was also explained to the patient.       Ethen Bannan G 01/05/2014, 8:41 AM

## 2014-01-05 ENCOUNTER — Encounter: Payer: Self-pay | Admitting: General Surgery

## 2014-01-09 ENCOUNTER — Ambulatory Visit: Payer: Self-pay | Admitting: General Surgery

## 2014-01-09 ENCOUNTER — Encounter: Payer: Self-pay | Admitting: General Surgery

## 2014-01-09 DIAGNOSIS — C50919 Malignant neoplasm of unspecified site of unspecified female breast: Secondary | ICD-10-CM

## 2014-01-09 HISTORY — PX: BREAST SURGERY: SHX581

## 2014-01-10 ENCOUNTER — Encounter: Payer: Self-pay | Admitting: General Surgery

## 2014-01-11 ENCOUNTER — Ambulatory Visit (INDEPENDENT_AMBULATORY_CARE_PROVIDER_SITE_OTHER): Payer: 59 | Admitting: *Deleted

## 2014-01-11 DIAGNOSIS — C50912 Malignant neoplasm of unspecified site of left female breast: Secondary | ICD-10-CM

## 2014-01-11 DIAGNOSIS — C50919 Malignant neoplasm of unspecified site of unspecified female breast: Secondary | ICD-10-CM

## 2014-01-11 DIAGNOSIS — C50911 Malignant neoplasm of unspecified site of right female breast: Secondary | ICD-10-CM

## 2014-01-11 NOTE — Patient Instructions (Signed)
The patient is aware to call back for any questions or concerns.  

## 2014-01-11 NOTE — Progress Notes (Signed)
Here today for postop wound check bilateral mastectomy.  Both drains in place. Redressed.  No sings infection noted. Dressings applied and surgical bra reapplied. Continue record sheet. Derma bond in place. Follow up as needed.

## 2014-01-12 ENCOUNTER — Ambulatory Visit (INDEPENDENT_AMBULATORY_CARE_PROVIDER_SITE_OTHER): Payer: 59 | Admitting: General Surgery

## 2014-01-12 ENCOUNTER — Encounter: Payer: Self-pay | Admitting: General Surgery

## 2014-01-12 VITALS — BP 134/72 | HR 84 | Resp 14 | Ht 64.0 in | Wt 179.0 lb

## 2014-01-12 DIAGNOSIS — C50912 Malignant neoplasm of unspecified site of left female breast: Secondary | ICD-10-CM

## 2014-01-12 DIAGNOSIS — C50919 Malignant neoplasm of unspecified site of unspecified female breast: Secondary | ICD-10-CM

## 2014-01-12 DIAGNOSIS — C50911 Malignant neoplasm of unspecified site of right female breast: Secondary | ICD-10-CM

## 2014-01-12 NOTE — Progress Notes (Signed)
Patient ID: Cassidy Norris, female   DOB: [birth year], 63 y.o.   MRN: [id number]  The patient presents for evaluation of mastectomy drainage. The patient states this morning she noticed drainage around her drain on the right side.  The drain tube on right had a clot.  The drain was milked and clot removed. Both chambers were changed.  f/  u as scheduled.

## 2014-01-13 LAB — PATHOLOGY REPORT

## 2014-01-15 ENCOUNTER — Ambulatory Visit: Payer: 59 | Admitting: General Surgery

## 2014-01-16 ENCOUNTER — Encounter: Payer: Self-pay | Admitting: General Surgery

## 2014-01-17 ENCOUNTER — Ambulatory Visit: Payer: 59 | Admitting: General Surgery

## 2014-01-17 ENCOUNTER — Encounter: Payer: Self-pay | Admitting: General Surgery

## 2014-01-17 ENCOUNTER — Ambulatory Visit (INDEPENDENT_AMBULATORY_CARE_PROVIDER_SITE_OTHER): Payer: 59 | Admitting: General Surgery

## 2014-01-17 ENCOUNTER — Encounter: Payer: Self-pay | Admitting: *Deleted

## 2014-01-17 VITALS — BP 128/78 | HR 78 | Resp 14 | Ht 64.0 in | Wt 178.0 lb

## 2014-01-17 DIAGNOSIS — C50919 Malignant neoplasm of unspecified site of unspecified female breast: Secondary | ICD-10-CM

## 2014-01-17 DIAGNOSIS — C50912 Malignant neoplasm of unspecified site of left female breast: Secondary | ICD-10-CM

## 2014-01-17 DIAGNOSIS — C50911 Malignant neoplasm of unspecified site of right female breast: Secondary | ICD-10-CM

## 2014-01-17 NOTE — Progress Notes (Signed)
This is a 63 year old female here today for her post op bilateral mastectomy done on 01/09/14. Patient states she is doing well.  Draining 70 ml yesterday from right and left. No clots noted.  Incisions are clean.  Path report=multifocal areas of cancer, invasive  mammary and lobular. One SN on left was positive with 66mm focus. Pt needs chemo and all of this explained to pt. Power port also explained. She is agreeable. Pt to see Dr. Oliva Bustard later this week. Drains need to stay till drainage less than 76ml per day.

## 2014-01-17 NOTE — Addendum Note (Signed)
Addended by: Christene Lye on: 01/17/2014 02:54 PM   Modules accepted: Orders

## 2014-01-17 NOTE — Patient Instructions (Signed)
Continue drain record May shower

## 2014-01-17 NOTE — Progress Notes (Signed)
Patient ID: Cassidy Norris, female   DOB: 1951-01-29, 63 y.o.   MRN: [id number]  Patient's port placement has been scheduled for 01-26-14 at [facility].   An appointment has also been scheduled for patient to see Dr. Oliva Bustard at the [facility] on 02-06-14 at 11:45 am. She is aware of date, time, and instructions.

## 2014-01-22 ENCOUNTER — Ambulatory Visit: Payer: Self-pay | Admitting: Oncology

## 2014-01-23 ENCOUNTER — Ambulatory Visit (INDEPENDENT_AMBULATORY_CARE_PROVIDER_SITE_OTHER): Payer: 59 | Admitting: *Deleted

## 2014-01-23 DIAGNOSIS — C50912 Malignant neoplasm of unspecified site of left female breast: Secondary | ICD-10-CM

## 2014-01-23 DIAGNOSIS — C50919 Malignant neoplasm of unspecified site of unspecified female breast: Secondary | ICD-10-CM

## 2014-01-23 DIAGNOSIS — C50911 Malignant neoplasm of unspecified site of right female breast: Secondary | ICD-10-CM

## 2014-01-23 NOTE — Progress Notes (Signed)
Here today for wound check. Right drain remains. Left drain removed.

## 2014-01-23 NOTE — Patient Instructions (Signed)
Continue  Drain right side

## 2014-01-25 ENCOUNTER — Telehealth: Payer: Self-pay | Admitting: *Deleted

## 2014-01-25 NOTE — Telephone Encounter (Signed)
She called with drain report. States it has drained 18 ml Tues and 50 ml on Wed and today already 20 ml. Dr Jamal Collin aware and port placement changed to 01-31-14. She is to call Monday and Tuesday with drain updates to see if able to remove before Wednesday, pt agrees.

## 2014-01-29 ENCOUNTER — Telehealth: Payer: Self-pay | Admitting: *Deleted

## 2014-01-29 NOTE — Telephone Encounter (Signed)
Pt called as instructed to report drain totals. She reported Thursday was 50 ml, Friday 50 ml, Saturday 40 ml, and Sunday 35 ml. She also stated as of this morning 01-29-14 it was draining at 20 ml so far. She said she will call back tomorrow with the rest of the totals. Thanks

## 2014-01-30 ENCOUNTER — Telehealth: Payer: Self-pay | Admitting: *Deleted

## 2014-01-30 NOTE — Telephone Encounter (Signed)
Return to work 02-12-14. Plan to place port 02-08-14 provided the drain can come out Monday or Tuesday. She will call back then with update.

## 2014-01-30 NOTE — Telephone Encounter (Signed)
Patient called this morning to let us know drain output.  Friday 50 ml, Sat. 40 ml, Sunday 35 ml, Monday 50 ml and this morning already 25 ml. Port placemat is scheduled for tomorrow ?

## 2014-01-30 NOTE — Telephone Encounter (Signed)
Change surgery (port placement) to next week per Dr Jamal Collin.

## 2014-02-06 ENCOUNTER — Ambulatory Visit (INDEPENDENT_AMBULATORY_CARE_PROVIDER_SITE_OTHER): Payer: 59 | Admitting: *Deleted

## 2014-02-06 DIAGNOSIS — C50919 Malignant neoplasm of unspecified site of unspecified female breast: Secondary | ICD-10-CM

## 2014-02-06 DIAGNOSIS — C50911 Malignant neoplasm of unspecified site of right female breast: Secondary | ICD-10-CM

## 2014-02-06 DIAGNOSIS — C50912 Malignant neoplasm of unspecified site of left female breast: Secondary | ICD-10-CM

## 2014-02-06 NOTE — Progress Notes (Signed)
Wound is clean, slightly red. Drainage has been 65 ml, 60 ml, 50 ml. Drain remains. Advised neosporin and band aid. Continue to record output. Dr. Jamal Collin aspirated 95 ml from left chest wall. Follow up appointment with MD for next week. Port placement postponed til further notice.

## 2014-02-06 NOTE — Patient Instructions (Signed)
continue drain record sheet.

## 2014-02-13 ENCOUNTER — Ambulatory Visit (INDEPENDENT_AMBULATORY_CARE_PROVIDER_SITE_OTHER): Payer: 59 | Admitting: General Surgery

## 2014-02-13 ENCOUNTER — Encounter: Payer: Self-pay | Admitting: General Surgery

## 2014-02-13 VITALS — BP 152/80 | HR 74 | Resp 14 | Ht 64.0 in | Wt 181.0 lb

## 2014-02-13 DIAGNOSIS — C50911 Malignant neoplasm of unspecified site of right female breast: Secondary | ICD-10-CM

## 2014-02-13 DIAGNOSIS — C50919 Malignant neoplasm of unspecified site of unspecified female breast: Secondary | ICD-10-CM

## 2014-02-13 DIAGNOSIS — C50912 Malignant neoplasm of unspecified site of left female breast: Secondary | ICD-10-CM

## 2014-02-13 NOTE — Progress Notes (Signed)
This is a 63 year old female here today for recheck on her drainage right mastectomy site. 35 ml aspirated from left chest wall. Drain removed from right chest wall. Incisions healing well. Return to work  after next week appointment. Hold off on port for now.

## 2014-02-13 NOTE — Patient Instructions (Signed)
Return in one week.  

## 2014-02-21 ENCOUNTER — Ambulatory Visit: Payer: Self-pay | Admitting: Oncology

## 2014-02-21 ENCOUNTER — Ambulatory Visit (INDEPENDENT_AMBULATORY_CARE_PROVIDER_SITE_OTHER): Payer: 59 | Admitting: General Surgery

## 2014-02-21 ENCOUNTER — Encounter: Payer: Self-pay | Admitting: General Surgery

## 2014-02-21 VITALS — BP 128/74 | HR 76 | Resp 14 | Ht 64.0 in | Wt 183.0 lb

## 2014-02-21 DIAGNOSIS — C50912 Malignant neoplasm of unspecified site of left female breast: Secondary | ICD-10-CM

## 2014-02-21 DIAGNOSIS — C50919 Malignant neoplasm of unspecified site of unspecified female breast: Secondary | ICD-10-CM

## 2014-02-21 NOTE — Progress Notes (Signed)
This is a 63 year old female here today for recheck on her bilateral mastectomy sites. States she is doing well. Right mastectomy site drained 20 ml. Left mastectomy siet drained 60 ml of fluid.  Feel port can be placed for chemotherapy.    Exam- neck is supple, no masses. Lugs clear. Heart in nsr, no murmurs. Abdomen soft. Non tender, no masses.  Impression: Multifocal breast CA. Post bil mastectomy.    Patient's surgery has been scheduled for 02-26-14 at [facility].

## 2014-02-21 NOTE — Patient Instructions (Signed)
Patient's surgery has been scheduled for 02-26-14 at [facility].

## 2014-02-26 ENCOUNTER — Ambulatory Visit: Payer: Self-pay | Admitting: General Surgery

## 2014-02-26 DIAGNOSIS — C50919 Malignant neoplasm of unspecified site of unspecified female breast: Secondary | ICD-10-CM

## 2014-02-27 ENCOUNTER — Telehealth: Payer: Self-pay | Admitting: *Deleted

## 2014-02-27 NOTE — Telephone Encounter (Signed)
FMLA papers faxed. Return to work note for Tuesday 03-06-14.

## 2014-02-28 ENCOUNTER — Encounter: Payer: Self-pay | Admitting: General Surgery

## 2014-03-01 LAB — CBC CANCER CENTER
BASOS ABS: 0.2 x10 3/mm — AB (ref 0.0–0.1)
Basophil %: 1.5 %
EOS ABS: 1.1 x10 3/mm — AB (ref 0.0–0.7)
EOS PCT: 9.9 %
HCT: 41.9 % (ref 35.0–47.0)
HGB: 14 g/dL (ref 12.0–16.0)
LYMPHS ABS: 3 x10 3/mm (ref 1.0–3.6)
LYMPHS PCT: 27.4 %
MCH: 30.7 pg (ref 26.0–34.0)
MCHC: 33.3 g/dL (ref 32.0–36.0)
MCV: 92 fL (ref 80–100)
MONO ABS: 1.1 x10 3/mm — AB (ref 0.2–0.9)
MONOS PCT: 9.8 %
Neutrophil #: 5.6 x10 3/mm (ref 1.4–6.5)
Neutrophil %: 51.4 %
Platelet: 247 x10 3/mm (ref 150–440)
RBC: 4.55 10*6/uL (ref 3.80–5.20)
RDW: 12.6 % (ref 11.5–14.5)
WBC: 10.8 x10 3/mm (ref 3.6–11.0)

## 2014-03-01 LAB — COMPREHENSIVE METABOLIC PANEL
ALK PHOS: 68 U/L
ALT: 17 U/L (ref 12–78)
ANION GAP: 9 (ref 7–16)
AST: 13 U/L — AB (ref 15–37)
Albumin: 3.8 g/dL (ref 3.4–5.0)
BUN: 14 mg/dL (ref 7–18)
Bilirubin,Total: 0.3 mg/dL (ref 0.2–1.0)
CHLORIDE: 103 mmol/L (ref 98–107)
CREATININE: 0.76 mg/dL (ref 0.60–1.30)
Calcium, Total: 8.5 mg/dL (ref 8.5–10.1)
Co2: 28 mmol/L (ref 21–32)
EGFR (Non-African Amer.): >60
Glucose: 96 mg/dL (ref 65–99)
Osmolality: 280 (ref 275–301)
POTASSIUM: 3.8 mmol/L (ref 3.5–5.1)
Sodium: 140 mmol/L (ref 136–145)
Total Protein: 7.2 g/dL (ref 6.4–8.2)

## 2014-03-05 ENCOUNTER — Ambulatory Visit (INDEPENDENT_AMBULATORY_CARE_PROVIDER_SITE_OTHER): Payer: 59 | Admitting: General Surgery

## 2014-03-05 VITALS — BP 150/70 | HR 80 | Resp 14 | Ht 64.0 in | Wt 183.0 lb

## 2014-03-05 DIAGNOSIS — C50912 Malignant neoplasm of unspecified site of left female breast: Secondary | ICD-10-CM

## 2014-03-05 DIAGNOSIS — C50911 Malignant neoplasm of unspecified site of right female breast: Secondary | ICD-10-CM

## 2014-03-05 DIAGNOSIS — C50919 Malignant neoplasm of unspecified site of unspecified female breast: Secondary | ICD-10-CM

## 2014-03-05 NOTE — Progress Notes (Signed)
This is a 63 year old female here today for her post op port placement. She states she is doing well.  Port site is healing well and lungs are clear Drained 43m5 of yellowish fluid from left mastectomy site and 20 ml from right. Patient ton return in one week.

## 2014-03-05 NOTE — Patient Instructions (Signed)
Patient to return in one week. 

## 2014-03-06 ENCOUNTER — Encounter: Payer: Self-pay | Admitting: General Surgery

## 2014-03-13 ENCOUNTER — Ambulatory Visit (INDEPENDENT_AMBULATORY_CARE_PROVIDER_SITE_OTHER): Payer: 59 | Admitting: General Surgery

## 2014-03-13 ENCOUNTER — Encounter: Payer: Self-pay | Admitting: General Surgery

## 2014-03-13 VITALS — BP 118/70 | HR 80 | Resp 14 | Ht 65.0 in | Wt 182.0 lb

## 2014-03-13 DIAGNOSIS — C50912 Malignant neoplasm of unspecified site of left female breast: Secondary | ICD-10-CM

## 2014-03-13 DIAGNOSIS — C50919 Malignant neoplasm of unspecified site of unspecified female breast: Secondary | ICD-10-CM

## 2014-03-13 NOTE — Patient Instructions (Signed)
Patient to return in 2 weeks for follow up. The patient is aware to call back for any questions or concerns.  

## 2014-03-13 NOTE — Progress Notes (Signed)
Patient ID: Cassidy Norris, female   DOB: 30-Jan-1951, 63 y.o.   MRN: [id number]   The patient presents for post op 1 week port placement. The patient denies any new problems at this time. She had started chemotherapy and has had one treatment so far.   Port site healing well.   Right mastectomy site is clean no seroma noted.   30 ml drained from left mastectomy site.

## 2014-03-14 ENCOUNTER — Encounter: Payer: Self-pay | Admitting: General Surgery

## 2014-03-22 LAB — COMPREHENSIVE METABOLIC PANEL
ALK PHOS: 69 U/L
ALT: 25 U/L (ref 12–78)
ANION GAP: 7 (ref 7–16)
Albumin: 3.7 g/dL (ref 3.4–5.0)
BUN: 9 mg/dL (ref 7–18)
Bilirubin,Total: 0.2 mg/dL (ref 0.2–1.0)
CO2: 27 mmol/L (ref 21–32)
Calcium, Total: 8.6 mg/dL (ref 8.5–10.1)
Chloride: 105 mmol/L (ref 98–107)
Creatinine: 0.73 mg/dL (ref 0.60–1.30)
EGFR (African American): >60
Glucose: 103 mg/dL — ABNORMAL HIGH (ref 65–99)
OSMOLALITY: 276 (ref 275–301)
POTASSIUM: 4 mmol/L (ref 3.5–5.1)
SGOT(AST): 18 U/L (ref 15–37)
Sodium: 139 mmol/L (ref 136–145)
Total Protein: 7.2 g/dL (ref 6.4–8.2)

## 2014-03-22 LAB — CBC CANCER CENTER
BASOS ABS: 0.2 x10 3/mm — AB (ref 0.0–0.1)
Basophil %: 1.3 %
EOS PCT: 0.6 %
Eosinophil #: 0.1 x10 3/mm (ref 0.0–0.7)
HCT: 40.4 % (ref 35.0–47.0)
HGB: 13.6 g/dL (ref 12.0–16.0)
LYMPHS PCT: 15.1 %
Lymphocyte #: 1.9 x10 3/mm (ref 1.0–3.6)
MCH: 30.4 pg (ref 26.0–34.0)
MCHC: 33.7 g/dL (ref 32.0–36.0)
MCV: 90 fL (ref 80–100)
MONOS PCT: 10.9 %
Monocyte #: 1.4 x10 3/mm — ABNORMAL HIGH (ref 0.2–0.9)
NEUTROS PCT: 72.1 %
Neutrophil #: 9.3 x10 3/mm — ABNORMAL HIGH (ref 1.4–6.5)
Platelet: 372 x10 3/mm (ref 150–440)
RBC: 4.47 10*6/uL (ref 3.80–5.20)
RDW: 12.8 % (ref 11.5–14.5)
WBC: 12.9 x10 3/mm — AB (ref 3.6–11.0)

## 2014-03-23 ENCOUNTER — Ambulatory Visit: Payer: Self-pay | Admitting: Oncology

## 2014-03-28 ENCOUNTER — Encounter: Payer: Self-pay | Admitting: General Surgery

## 2014-03-28 ENCOUNTER — Ambulatory Visit (INDEPENDENT_AMBULATORY_CARE_PROVIDER_SITE_OTHER): Payer: 59 | Admitting: General Surgery

## 2014-03-28 VITALS — BP 138/90 | HR 88 | Resp 16 | Ht 65.0 in | Wt 183.0 lb

## 2014-03-28 DIAGNOSIS — C50911 Malignant neoplasm of unspecified site of right female breast: Secondary | ICD-10-CM

## 2014-03-28 DIAGNOSIS — C50919 Malignant neoplasm of unspecified site of unspecified female breast: Secondary | ICD-10-CM

## 2014-03-28 DIAGNOSIS — C50912 Malignant neoplasm of unspecified site of left female breast: Secondary | ICD-10-CM

## 2014-03-28 NOTE — Patient Instructions (Signed)
Patient to return in 2 weeks  

## 2014-03-28 NOTE — Progress Notes (Signed)
This is a 63 year female here today for her two week follow up port placement done on  02/26/14. Patient states she is doing well.  Port looks clean and healing well. Left mastectomy site-small seroma,  drained 35 cc of fluid.Patient to return in two weeks.

## 2014-04-09 ENCOUNTER — Ambulatory Visit (INDEPENDENT_AMBULATORY_CARE_PROVIDER_SITE_OTHER): Payer: 59 | Admitting: General Surgery

## 2014-04-09 ENCOUNTER — Ambulatory Visit: Payer: 59 | Admitting: General Surgery

## 2014-04-09 ENCOUNTER — Encounter: Payer: Self-pay | Admitting: General Surgery

## 2014-04-09 VITALS — BP 128/82 | HR 76 | Resp 14 | Ht 64.0 in | Wt 191.0 lb

## 2014-04-09 DIAGNOSIS — C50912 Malignant neoplasm of unspecified site of left female breast: Secondary | ICD-10-CM

## 2014-04-09 DIAGNOSIS — C50919 Malignant neoplasm of unspecified site of unspecified female breast: Secondary | ICD-10-CM

## 2014-04-09 NOTE — Progress Notes (Signed)
Patient ID: Cassidy Norris, female   DOB: [birth year], 63 y.o.   MRN: [id number]   The patient presents for a post bilateral mastectomy and port placement. The procedure was performed on 02/26/14. The patient is doing well.   Bilateral mastectomy sites and port placement site is well healed and clean. No fluid present. Patient to return in 6 months for follow up. No seroma noted on either side today. Venous port is intact in right upper chest.

## 2014-04-09 NOTE — Patient Instructions (Signed)
Patient to return in 6 months for a follow up appointment. The patient is aware to call back for any questions or concerns.

## 2014-04-11 ENCOUNTER — Ambulatory Visit: Payer: 59 | Admitting: General Surgery

## 2014-04-11 LAB — CBC CANCER CENTER
BASOS PCT: 1.5 %
Basophil #: 0.2 x10 3/mm — ABNORMAL HIGH (ref 0.0–0.1)
Eosinophil #: 0.2 x10 3/mm (ref 0.0–0.7)
Eosinophil %: 1.8 %
HCT: 36.4 % (ref 35.0–47.0)
HGB: 12.5 g/dL (ref 12.0–16.0)
LYMPHS PCT: 14.6 %
Lymphocyte #: 1.6 x10 3/mm (ref 1.0–3.6)
MCH: 31 pg (ref 26.0–34.0)
MCHC: 34.4 g/dL (ref 32.0–36.0)
MCV: 90 fL (ref 80–100)
Monocyte #: 1.8 x10 3/mm — ABNORMAL HIGH (ref 0.2–0.9)
Monocyte %: 16.8 %
NEUTROS ABS: 7 x10 3/mm — AB (ref 1.4–6.5)
Neutrophil %: 65.3 %
PLATELETS: 302 x10 3/mm (ref 150–440)
RBC: 4.04 10*6/uL (ref 3.80–5.20)
RDW: 13.7 % (ref 11.5–14.5)
WBC: 10.7 x10 3/mm (ref 3.6–11.0)

## 2014-04-11 LAB — COMPREHENSIVE METABOLIC PANEL
ALBUMIN: 3.7 g/dL (ref 3.4–5.0)
AST: 24 U/L (ref 15–37)
Alkaline Phosphatase: 62 U/L
Anion Gap: 8 (ref 7–16)
BILIRUBIN TOTAL: 0.2 mg/dL (ref 0.2–1.0)
BUN: 10 mg/dL (ref 7–18)
Calcium, Total: 8.6 mg/dL (ref 8.5–10.1)
Chloride: 104 mmol/L (ref 98–107)
Co2: 27 mmol/L (ref 21–32)
Creatinine: 0.61 mg/dL (ref 0.60–1.30)
EGFR (African American): >60
EGFR (Non-African Amer.): >60
GLUCOSE: 117 mg/dL — AB (ref 65–99)
Osmolality: 278 (ref 275–301)
Potassium: 3.8 mmol/L (ref 3.5–5.1)
SGPT (ALT): 29 U/L (ref 12–78)
Sodium: 139 mmol/L (ref 136–145)
Total Protein: 7.1 g/dL (ref 6.4–8.2)

## 2014-04-23 ENCOUNTER — Ambulatory Visit: Payer: Self-pay | Admitting: Oncology

## 2014-05-02 LAB — CBC CANCER CENTER
BASOS ABS: 0.1 x10 3/mm (ref 0.0–0.1)
BASOS PCT: 1.2 %
Eosinophil #: 0.2 x10 3/mm (ref 0.0–0.7)
Eosinophil %: 2.3 %
HCT: 35 % (ref 35.0–47.0)
HGB: 11.6 g/dL — ABNORMAL LOW (ref 12.0–16.0)
Lymphocyte #: 0.9 x10 3/mm — ABNORMAL LOW (ref 1.0–3.6)
Lymphocyte %: 10.4 %
MCH: 30.6 pg (ref 26.0–34.0)
MCHC: 33.1 g/dL (ref 32.0–36.0)
MCV: 93 fL (ref 80–100)
MONOS PCT: 15.7 %
Monocyte #: 1.4 x10 3/mm — ABNORMAL HIGH (ref 0.2–0.9)
NEUTROS ABS: 6.2 x10 3/mm (ref 1.4–6.5)
NEUTROS PCT: 70.4 %
Platelet: 345 x10 3/mm (ref 150–440)
RBC: 3.78 10*6/uL — ABNORMAL LOW (ref 3.80–5.20)
RDW: 16.4 % — AB (ref 11.5–14.5)
WBC: 8.8 x10 3/mm (ref 3.6–11.0)

## 2014-05-02 LAB — COMPREHENSIVE METABOLIC PANEL
ALBUMIN: 3.6 g/dL (ref 3.4–5.0)
ALK PHOS: 62 U/L
ANION GAP: 3 — AB (ref 7–16)
BUN: 8 mg/dL (ref 7–18)
Bilirubin,Total: 0.3 mg/dL (ref 0.2–1.0)
CHLORIDE: 107 mmol/L (ref 98–107)
CREATININE: 0.73 mg/dL (ref 0.60–1.30)
Calcium, Total: 8.4 mg/dL — ABNORMAL LOW (ref 8.5–10.1)
Co2: 28 mmol/L (ref 21–32)
EGFR (Non-African Amer.): >60
Glucose: 121 mg/dL — ABNORMAL HIGH (ref 65–99)
Osmolality: 275 (ref 275–301)
POTASSIUM: 3.6 mmol/L (ref 3.5–5.1)
SGOT(AST): 39 U/L — ABNORMAL HIGH (ref 15–37)
SGPT (ALT): 36 U/L (ref 12–78)
Sodium: 138 mmol/L (ref 136–145)
TOTAL PROTEIN: 7 g/dL (ref 6.4–8.2)

## 2014-05-23 ENCOUNTER — Ambulatory Visit: Payer: Self-pay | Admitting: Oncology

## 2014-05-23 LAB — COMPREHENSIVE METABOLIC PANEL
ALK PHOS: 65 U/L
ANION GAP: 10 (ref 7–16)
Albumin: 3.6 g/dL (ref 3.4–5.0)
BILIRUBIN TOTAL: 0.4 mg/dL (ref 0.2–1.0)
BUN: 8 mg/dL (ref 7–18)
CO2: 26 mmol/L (ref 21–32)
Calcium, Total: 8.8 mg/dL (ref 8.5–10.1)
Chloride: 104 mmol/L (ref 98–107)
Creatinine: 0.84 mg/dL (ref 0.60–1.30)
GLUCOSE: 163 mg/dL — AB (ref 65–99)
OSMOLALITY: 281 (ref 275–301)
Potassium: 3.5 mmol/L (ref 3.5–5.1)
SGOT(AST): 27 U/L (ref 15–37)
SGPT (ALT): 34 U/L (ref 12–78)
SODIUM: 140 mmol/L (ref 136–145)
Total Protein: 7.1 g/dL (ref 6.4–8.2)

## 2014-05-23 LAB — CBC CANCER CENTER
Basophil #: 0.1 x10 3/mm (ref 0.0–0.1)
Basophil %: 0.8 %
Eosinophil #: 0.1 x10 3/mm (ref 0.0–0.7)
Eosinophil %: 1.1 %
HCT: 35.1 % (ref 35.0–47.0)
HGB: 11.9 g/dL — ABNORMAL LOW (ref 12.0–16.0)
Lymphocyte #: 1.1 x10 3/mm (ref 1.0–3.6)
Lymphocyte %: 12.7 %
MCH: 32.1 pg (ref 26.0–34.0)
MCHC: 34 g/dL (ref 32.0–36.0)
MCV: 94 fL (ref 80–100)
MONO ABS: 1.2 x10 3/mm — AB (ref 0.2–0.9)
Monocyte %: 13.9 %
Neutrophil #: 6.1 x10 3/mm (ref 1.4–6.5)
Neutrophil %: 71.5 %
PLATELETS: 374 x10 3/mm (ref 150–440)
RBC: 3.72 10*6/uL — ABNORMAL LOW (ref 3.80–5.20)
RDW: 17.2 % — ABNORMAL HIGH (ref 11.5–14.5)
WBC: 8.5 x10 3/mm (ref 3.6–11.0)

## 2014-05-24 LAB — HER-2 / NEU, FISH
Avg Num CEP17 probes/nucleus:: 1.9
Avg Num Her-2 signals/nucleus:: 2
HER-2/CEP17 Ratio: 1.04
Number of Observers:: 2
Number of Tumor Cells Counted:: 40

## 2014-05-24 LAB — ER/PR,IMMUNOHISTOCHEM,PARAFFIN
Estrogen Receptor IHC: >90 %
Progesterone Recp IP: >90 %

## 2014-05-30 LAB — CBC CANCER CENTER
Basophil #: 0.1 x10 3/mm (ref 0.0–0.1)
Basophil %: 1.3 %
Eosinophil #: 0.2 x10 3/mm (ref 0.0–0.7)
Eosinophil %: 3.4 %
HCT: 34.5 % — ABNORMAL LOW (ref 35.0–47.0)
HGB: 11.5 g/dL — ABNORMAL LOW (ref 12.0–16.0)
LYMPHS ABS: 1 x10 3/mm (ref 1.0–3.6)
Lymphocyte %: 14.3 %
MCH: 31.8 pg (ref 26.0–34.0)
MCHC: 33.3 g/dL (ref 32.0–36.0)
MCV: 95 fL (ref 80–100)
MONOS PCT: 8.1 %
Monocyte #: 0.6 x10 3/mm (ref 0.2–0.9)
Neutrophil #: 5.3 x10 3/mm (ref 1.4–6.5)
Neutrophil %: 72.9 %
Platelet: 301 x10 3/mm (ref 150–440)
RBC: 3.62 10*6/uL — ABNORMAL LOW (ref 3.80–5.20)
RDW: 16.5 % — ABNORMAL HIGH (ref 11.5–14.5)
WBC: 7.2 x10 3/mm (ref 3.6–11.0)

## 2014-06-06 LAB — CBC CANCER CENTER
BASOS ABS: 0.1 x10 3/mm (ref 0.0–0.1)
BASOS PCT: 1.4 %
EOS PCT: 11.1 %
Eosinophil #: 0.6 x10 3/mm (ref 0.0–0.7)
HCT: 33 % — ABNORMAL LOW (ref 35.0–47.0)
HGB: 11.2 g/dL — ABNORMAL LOW (ref 12.0–16.0)
LYMPHS ABS: 1 x10 3/mm (ref 1.0–3.6)
LYMPHS PCT: 17.6 %
MCH: 32.5 pg (ref 26.0–34.0)
MCHC: 34 g/dL (ref 32.0–36.0)
MCV: 96 fL (ref 80–100)
MONO ABS: 0.5 x10 3/mm (ref 0.2–0.9)
Monocyte %: 9.1 %
Neutrophil #: 3.3 x10 3/mm (ref 1.4–6.5)
Neutrophil %: 60.8 %
Platelet: 209 x10 3/mm (ref 150–440)
RBC: 3.44 10*6/uL — AB (ref 3.80–5.20)
RDW: 16.4 % — ABNORMAL HIGH (ref 11.5–14.5)
WBC: 5.5 x10 3/mm (ref 3.6–11.0)

## 2014-06-06 LAB — COMPREHENSIVE METABOLIC PANEL
ALBUMIN: 3.5 g/dL (ref 3.4–5.0)
Alkaline Phosphatase: 58 U/L
Anion Gap: 10 (ref 7–16)
BUN: 10 mg/dL (ref 7–18)
Bilirubin,Total: 0.5 mg/dL (ref 0.2–1.0)
CALCIUM: 8.4 mg/dL — AB (ref 8.5–10.1)
CHLORIDE: 104 mmol/L (ref 98–107)
Co2: 27 mmol/L (ref 21–32)
Creatinine: 0.65 mg/dL (ref 0.60–1.30)
EGFR (African American): >60
Glucose: 92 mg/dL (ref 65–99)
OSMOLALITY: 280 (ref 275–301)
POTASSIUM: 3.4 mmol/L — AB (ref 3.5–5.1)
SGOT(AST): 28 U/L (ref 15–37)
SGPT (ALT): 33 U/L (ref 12–78)
Sodium: 141 mmol/L (ref 136–145)
Total Protein: 6.8 g/dL (ref 6.4–8.2)

## 2014-06-13 LAB — CBC CANCER CENTER
Basophil #: 0.1 x10 3/mm (ref 0.0–0.1)
Basophil %: 1.4 %
Eosinophil #: 0.3 x10 3/mm (ref 0.0–0.7)
Eosinophil %: 5.4 %
HCT: 34.1 % — ABNORMAL LOW (ref 35.0–47.0)
HGB: 11.3 g/dL — ABNORMAL LOW (ref 12.0–16.0)
Lymphocyte #: 1 x10 3/mm (ref 1.0–3.6)
Lymphocyte %: 18.6 %
MCH: 32.2 pg (ref 26.0–34.0)
MCHC: 33.1 g/dL (ref 32.0–36.0)
MCV: 97 fL (ref 80–100)
MONO ABS: 0.4 x10 3/mm (ref 0.2–0.9)
Monocyte %: 7.2 %
NEUTROS PCT: 67.4 %
Neutrophil #: 3.5 x10 3/mm (ref 1.4–6.5)
Platelet: 270 x10 3/mm (ref 150–440)
RBC: 3.5 10*6/uL — AB (ref 3.80–5.20)
RDW: 16.2 % — ABNORMAL HIGH (ref 11.5–14.5)
WBC: 5.2 x10 3/mm (ref 3.6–11.0)

## 2014-06-20 LAB — CBC CANCER CENTER
BASOS ABS: 0.1 x10 3/mm (ref 0.0–0.1)
Basophil %: 1.7 %
EOS ABS: 0.2 x10 3/mm (ref 0.0–0.7)
EOS PCT: 4.1 %
HCT: 32.2 % — ABNORMAL LOW (ref 35.0–47.0)
HGB: 10.9 g/dL — ABNORMAL LOW (ref 12.0–16.0)
Lymphocyte #: 1 x10 3/mm (ref 1.0–3.6)
Lymphocyte %: 21.6 %
MCH: 33.2 pg (ref 26.0–34.0)
MCHC: 33.7 g/dL (ref 32.0–36.0)
MCV: 99 fL (ref 80–100)
MONO ABS: 0.4 x10 3/mm (ref 0.2–0.9)
Monocyte %: 8.7 %
NEUTROS PCT: 63.9 %
Neutrophil #: 2.9 x10 3/mm (ref 1.4–6.5)
PLATELETS: 283 x10 3/mm (ref 150–440)
RBC: 3.27 10*6/uL — ABNORMAL LOW (ref 3.80–5.20)
RDW: 16.4 % — AB (ref 11.5–14.5)
WBC: 4.5 x10 3/mm (ref 3.6–11.0)

## 2014-06-20 LAB — COMPREHENSIVE METABOLIC PANEL
ALT: 34 U/L
AST: 23 U/L (ref 15–37)
Albumin: 3.4 g/dL (ref 3.4–5.0)
Alkaline Phosphatase: 60 U/L
Anion Gap: 9 (ref 7–16)
BILIRUBIN TOTAL: 0.4 mg/dL (ref 0.2–1.0)
BUN: 11 mg/dL (ref 7–18)
CREATININE: 0.73 mg/dL (ref 0.60–1.30)
Calcium, Total: 8.6 mg/dL (ref 8.5–10.1)
Chloride: 104 mmol/L (ref 98–107)
Co2: 27 mmol/L (ref 21–32)
EGFR (Non-African Amer.): >60
Glucose: 93 mg/dL (ref 65–99)
OSMOLALITY: 278 (ref 275–301)
Potassium: 3.3 mmol/L — ABNORMAL LOW (ref 3.5–5.1)
Sodium: 140 mmol/L (ref 136–145)
Total Protein: 6.5 g/dL (ref 6.4–8.2)

## 2014-06-23 ENCOUNTER — Ambulatory Visit: Payer: Self-pay | Admitting: Oncology

## 2014-06-27 LAB — CBC CANCER CENTER
BASOS ABS: 0.1 x10 3/mm (ref 0.0–0.1)
BASOS PCT: 1.3 %
Eosinophil #: 0.2 x10 3/mm (ref 0.0–0.7)
Eosinophil %: 4.1 %
HCT: 27.5 % — ABNORMAL LOW (ref 35.0–47.0)
HGB: 8.9 g/dL — ABNORMAL LOW (ref 12.0–16.0)
LYMPHS PCT: 20.2 %
Lymphocyte #: 0.8 x10 3/mm — ABNORMAL LOW (ref 1.0–3.6)
MCH: 33.1 pg (ref 26.0–34.0)
MCHC: 32.5 g/dL (ref 32.0–36.0)
MCV: 102 fL — AB (ref 80–100)
Monocyte #: 0.3 x10 3/mm (ref 0.2–0.9)
Monocyte %: 8.9 %
Neutrophil #: 2.5 x10 3/mm (ref 1.4–6.5)
Neutrophil %: 65.5 %
PLATELETS: 208 x10 3/mm (ref 150–440)
RBC: 2.7 10*6/uL — ABNORMAL LOW (ref 3.80–5.20)
RDW: 16.5 % — ABNORMAL HIGH (ref 11.5–14.5)
WBC: 3.9 x10 3/mm (ref 3.6–11.0)

## 2014-07-04 LAB — CBC CANCER CENTER
Basophil #: 0.1 x10 3/mm (ref 0.0–0.1)
Basophil %: 1.5 %
EOS ABS: 0.1 x10 3/mm (ref 0.0–0.7)
EOS PCT: 3.5 %
HCT: 34.6 % — AB (ref 35.0–47.0)
HGB: 11.5 g/dL — ABNORMAL LOW (ref 12.0–16.0)
Lymphocyte #: 0.9 x10 3/mm — ABNORMAL LOW (ref 1.0–3.6)
Lymphocyte %: 20.6 %
MCH: 33.5 pg (ref 26.0–34.0)
MCHC: 33.3 g/dL (ref 32.0–36.0)
MCV: 101 fL — ABNORMAL HIGH (ref 80–100)
MONO ABS: 0.5 x10 3/mm (ref 0.2–0.9)
Monocyte %: 10.6 %
NEUTROS PCT: 63.8 %
Neutrophil #: 2.8 x10 3/mm (ref 1.4–6.5)
Platelet: 268 x10 3/mm (ref 150–440)
RBC: 3.43 10*6/uL — AB (ref 3.80–5.20)
RDW: 16.9 % — ABNORMAL HIGH (ref 11.5–14.5)
WBC: 4.3 x10 3/mm (ref 3.6–11.0)

## 2014-07-04 LAB — COMPREHENSIVE METABOLIC PANEL
ALBUMIN: 3.3 g/dL — AB (ref 3.4–5.0)
AST: 26 U/L (ref 15–37)
Alkaline Phosphatase: 56 U/L
Anion Gap: 10 (ref 7–16)
BILIRUBIN TOTAL: 0.5 mg/dL (ref 0.2–1.0)
BUN: 8 mg/dL (ref 7–18)
CALCIUM: 7.9 mg/dL — AB (ref 8.5–10.1)
CO2: 23 mmol/L (ref 21–32)
Chloride: 105 mmol/L (ref 98–107)
Creatinine: 0.61 mg/dL (ref 0.60–1.30)
EGFR (African American): >60
GLUCOSE: 94 mg/dL (ref 65–99)
Osmolality: 274 (ref 275–301)
Potassium: 3.2 mmol/L — ABNORMAL LOW (ref 3.5–5.1)
SGPT (ALT): 29 U/L
SODIUM: 138 mmol/L (ref 136–145)
TOTAL PROTEIN: 6.1 g/dL — AB (ref 6.4–8.2)

## 2014-07-11 LAB — CBC CANCER CENTER
Basophil #: 0.1 x10 3/mm (ref 0.0–0.1)
Basophil %: 1.4 %
EOS PCT: 3.1 %
Eosinophil #: 0.2 x10 3/mm (ref 0.0–0.7)
HCT: 33.2 % — ABNORMAL LOW (ref 35.0–47.0)
HGB: 11.2 g/dL — AB (ref 12.0–16.0)
LYMPHS ABS: 0.9 x10 3/mm — AB (ref 1.0–3.6)
LYMPHS PCT: 18.5 %
MCH: 33.8 pg (ref 26.0–34.0)
MCHC: 33.7 g/dL (ref 32.0–36.0)
MCV: 100 fL (ref 80–100)
MONOS PCT: 8.8 %
Monocyte #: 0.4 x10 3/mm (ref 0.2–0.9)
NEUTROS ABS: 3.4 x10 3/mm (ref 1.4–6.5)
Neutrophil %: 68.2 %
PLATELETS: 272 x10 3/mm (ref 150–440)
RBC: 3.31 10*6/uL — AB (ref 3.80–5.20)
RDW: 16.6 % — AB (ref 11.5–14.5)
WBC: 4.9 x10 3/mm (ref 3.6–11.0)

## 2014-07-18 LAB — CBC CANCER CENTER
Basophil #: 0.1 x10 3/mm (ref 0.0–0.1)
Basophil %: 1.5 %
Eosinophil #: 0.2 x10 3/mm (ref 0.0–0.7)
Eosinophil %: 2.8 %
HCT: 34.4 % — AB (ref 35.0–47.0)
HGB: 11.5 g/dL — ABNORMAL LOW (ref 12.0–16.0)
Lymphocyte #: 0.9 x10 3/mm — ABNORMAL LOW (ref 1.0–3.6)
Lymphocyte %: 16.6 %
MCH: 33.9 pg (ref 26.0–34.0)
MCHC: 33.4 g/dL (ref 32.0–36.0)
MCV: 102 fL — ABNORMAL HIGH (ref 80–100)
MONO ABS: 0.6 x10 3/mm (ref 0.2–0.9)
Monocyte %: 11.4 %
NEUTROS ABS: 3.6 x10 3/mm (ref 1.4–6.5)
NEUTROS PCT: 67.7 %
Platelet: 279 x10 3/mm (ref 150–440)
RBC: 3.39 10*6/uL — AB (ref 3.80–5.20)
RDW: 17 % — ABNORMAL HIGH (ref 11.5–14.5)
WBC: 5.4 x10 3/mm (ref 3.6–11.0)

## 2014-07-18 LAB — COMPREHENSIVE METABOLIC PANEL
ALK PHOS: 50 U/L
Albumin: 3.4 g/dL (ref 3.4–5.0)
Anion Gap: 11 (ref 7–16)
BUN: 9 mg/dL (ref 7–18)
Bilirubin,Total: 0.4 mg/dL (ref 0.2–1.0)
CHLORIDE: 104 mmol/L (ref 98–107)
Calcium, Total: 8.2 mg/dL — ABNORMAL LOW (ref 8.5–10.1)
Co2: 26 mmol/L (ref 21–32)
Creatinine: 0.69 mg/dL (ref 0.60–1.30)
EGFR (African American): >60
EGFR (Non-African Amer.): >60
Glucose: 95 mg/dL (ref 65–99)
OSMOLALITY: 280 (ref 275–301)
Potassium: 3.4 mmol/L — ABNORMAL LOW (ref 3.5–5.1)
SGOT(AST): 23 U/L (ref 15–37)
SGPT (ALT): 29 U/L
SODIUM: 141 mmol/L (ref 136–145)
Total Protein: 6.4 g/dL (ref 6.4–8.2)

## 2014-07-20 LAB — HM PAP SMEAR

## 2014-07-23 LAB — COMPREHENSIVE METABOLIC PANEL
ALK PHOS: 50 U/L
ALT: 25 U/L
Albumin: 3.5 g/dL (ref 3.4–5.0)
Anion Gap: 9 (ref 7–16)
BUN: 12 mg/dL (ref 7–18)
Bilirubin,Total: 0.5 mg/dL (ref 0.2–1.0)
CALCIUM: 8.1 mg/dL — AB (ref 8.5–10.1)
Chloride: 106 mmol/L (ref 98–107)
Co2: 26 mmol/L (ref 21–32)
Creatinine: 0.77 mg/dL (ref 0.60–1.30)
EGFR (Non-African Amer.): >60
Glucose: 131 mg/dL — ABNORMAL HIGH (ref 65–99)
Osmolality: 283 (ref 275–301)
Potassium: 3.8 mmol/L (ref 3.5–5.1)
SGOT(AST): 22 U/L (ref 15–37)
Sodium: 141 mmol/L (ref 136–145)
Total Protein: 6.4 g/dL (ref 6.4–8.2)

## 2014-07-23 LAB — CBC CANCER CENTER
BASOS PCT: 1 %
Basophil #: 0 x10 3/mm (ref 0.0–0.1)
Eosinophil #: 0.1 x10 3/mm (ref 0.0–0.7)
Eosinophil %: 1.6 %
HCT: 35.3 % (ref 35.0–47.0)
HGB: 11.5 g/dL — AB (ref 12.0–16.0)
LYMPHS PCT: 15.4 %
Lymphocyte #: 0.8 x10 3/mm — ABNORMAL LOW (ref 1.0–3.6)
MCH: 33.5 pg (ref 26.0–34.0)
MCHC: 32.7 g/dL (ref 32.0–36.0)
MCV: 103 fL — AB (ref 80–100)
Monocyte #: 0.3 x10 3/mm (ref 0.2–0.9)
Monocyte %: 5.6 %
Neutrophil #: 3.7 x10 3/mm (ref 1.4–6.5)
Neutrophil %: 76.4 %
Platelet: 248 x10 3/mm (ref 150–440)
RBC: 3.44 10*6/uL — ABNORMAL LOW (ref 3.80–5.20)
RDW: 17.1 % — ABNORMAL HIGH (ref 11.5–14.5)
WBC: 4.9 x10 3/mm (ref 3.6–11.0)

## 2014-07-24 ENCOUNTER — Ambulatory Visit: Payer: Self-pay | Admitting: Oncology

## 2014-07-25 LAB — CBC CANCER CENTER
BASOS ABS: 0 x10 3/mm (ref 0.0–0.1)
Basophil %: 0.3 %
EOS ABS: 0 x10 3/mm (ref 0.0–0.7)
Eosinophil %: 0 %
HCT: 32.3 % — ABNORMAL LOW (ref 35.0–47.0)
HGB: 10.8 g/dL — AB (ref 12.0–16.0)
LYMPHS ABS: 0.5 x10 3/mm — AB (ref 1.0–3.6)
Lymphocyte %: 6.6 %
MCH: 34 pg (ref 26.0–34.0)
MCHC: 33.3 g/dL (ref 32.0–36.0)
MCV: 102 fL — AB (ref 80–100)
MONOS PCT: 3.9 %
Monocyte #: 0.3 x10 3/mm (ref 0.2–0.9)
NEUTROS PCT: 89.2 %
Neutrophil #: 7.4 x10 3/mm — ABNORMAL HIGH (ref 1.4–6.5)
PLATELETS: 270 x10 3/mm (ref 150–440)
RBC: 3.17 10*6/uL — ABNORMAL LOW (ref 3.80–5.20)
RDW: 17 % — ABNORMAL HIGH (ref 11.5–14.5)
WBC: 8.3 x10 3/mm (ref 3.6–11.0)

## 2014-08-01 LAB — COMPREHENSIVE METABOLIC PANEL
ANION GAP: 9 (ref 7–16)
AST: 19 U/L (ref 15–37)
Albumin: 3.2 g/dL — ABNORMAL LOW (ref 3.4–5.0)
Alkaline Phosphatase: 48 U/L
BILIRUBIN TOTAL: 0.5 mg/dL (ref 0.2–1.0)
BUN: 10 mg/dL (ref 7–18)
CALCIUM: 8.3 mg/dL — AB (ref 8.5–10.1)
CHLORIDE: 102 mmol/L (ref 98–107)
CO2: 27 mmol/L (ref 21–32)
Creatinine: 0.78 mg/dL (ref 0.60–1.30)
EGFR (African American): >60
EGFR (Non-African Amer.): >60
GLUCOSE: 109 mg/dL — AB (ref 65–99)
Osmolality: 275 (ref 275–301)
Potassium: 3.9 mmol/L (ref 3.5–5.1)
SGPT (ALT): 26 U/L
Sodium: 138 mmol/L (ref 136–145)
TOTAL PROTEIN: 6.2 g/dL — AB (ref 6.4–8.2)

## 2014-08-01 LAB — CBC CANCER CENTER
BASOS PCT: 0.3 %
Basophil #: 0 x10 3/mm (ref 0.0–0.1)
EOS ABS: 0.2 x10 3/mm (ref 0.0–0.7)
EOS PCT: 3.1 %
HCT: 35.1 % (ref 35.0–47.0)
HGB: 11.6 g/dL — AB (ref 12.0–16.0)
LYMPHS PCT: 18.1 %
Lymphocyte #: 0.9 x10 3/mm — ABNORMAL LOW (ref 1.0–3.6)
MCH: 33.7 pg (ref 26.0–34.0)
MCHC: 33.2 g/dL (ref 32.0–36.0)
MCV: 102 fL — ABNORMAL HIGH (ref 80–100)
MONO ABS: 0.5 x10 3/mm (ref 0.2–0.9)
Monocyte %: 11.1 %
NEUTROS PCT: 67.4 %
Neutrophil #: 3.3 x10 3/mm (ref 1.4–6.5)
Platelet: 302 x10 3/mm (ref 150–440)
RBC: 3.45 10*6/uL — ABNORMAL LOW (ref 3.80–5.20)
RDW: 16.7 % — ABNORMAL HIGH (ref 11.5–14.5)
WBC: 4.9 x10 3/mm (ref 3.6–11.0)

## 2014-08-06 ENCOUNTER — Ambulatory Visit (INDEPENDENT_AMBULATORY_CARE_PROVIDER_SITE_OTHER): Payer: Self-pay | Admitting: General Surgery

## 2014-08-06 ENCOUNTER — Encounter: Payer: Self-pay | Admitting: General Surgery

## 2014-08-06 VITALS — BP 126/68 | HR 70 | Resp 12 | Ht 64.0 in | Wt 195.0 lb

## 2014-08-06 DIAGNOSIS — C50919 Malignant neoplasm of unspecified site of unspecified female breast: Secondary | ICD-10-CM

## 2014-08-06 DIAGNOSIS — C50912 Malignant neoplasm of unspecified site of left female breast: Secondary | ICD-10-CM

## 2014-08-06 NOTE — Progress Notes (Signed)
This is a 63 year old female here today for a fluid check around her mastectomy site. Last chemo treatment was 08/01/14.  Mastectomy site looks clean and well healed. No sign of infection or seroma. Follow up as scheduled.

## 2014-08-14 ENCOUNTER — Other Ambulatory Visit: Payer: Self-pay | Admitting: Neurosurgery

## 2014-08-14 DIAGNOSIS — M5126 Other intervertebral disc displacement, lumbar region: Secondary | ICD-10-CM

## 2014-08-17 ENCOUNTER — Ambulatory Visit
Admission: RE | Admit: 2014-08-17 | Discharge: 2014-08-17 | Disposition: A | Discharge status: Home / Self Care | Payer: 59 | Source: Ambulatory Visit | Attending: Neurosurgery | Admitting: Neurosurgery

## 2014-08-17 ENCOUNTER — Other Ambulatory Visit: Payer: Self-pay | Admitting: Neurosurgery

## 2014-08-17 DIAGNOSIS — M5126 Other intervertebral disc displacement, lumbar region: Secondary | ICD-10-CM

## 2014-08-17 MED ORDER — IOHEXOL 180 MG/ML  SOLN
1.0000 mL | Freq: Once | INTRAMUSCULAR | Status: AC | PRN
  Administered 2014-08-17: 1 mL via EPIDURAL

## 2014-08-17 MED ORDER — METHYLPREDNISOLONE ACETATE 40 MG/ML INJ SUSP (RADIOLOG
120.0000 mg | Freq: Once | INTRAMUSCULAR | Status: AC
  Administered 2014-08-17: 120 mg via EPIDURAL

## 2014-08-17 NOTE — Discharge Instructions (Signed)

## 2014-08-23 ENCOUNTER — Ambulatory Visit: Payer: Self-pay | Admitting: Oncology

## 2014-08-28 DIAGNOSIS — S42409A Unspecified fracture of lower end of unspecified humerus, initial encounter for closed fracture: Secondary | ICD-10-CM

## 2014-08-28 HISTORY — DX: Unspecified fracture of lower end of unspecified humerus, initial encounter for closed fracture: S42.409A

## 2014-09-14 HISTORY — PX: SPINE SURGERY: SHX786

## 2014-09-23 ENCOUNTER — Ambulatory Visit: Payer: Self-pay | Admitting: Oncology

## 2014-09-24 ENCOUNTER — Encounter: Payer: Self-pay | Admitting: General Surgery

## 2014-10-01 LAB — CBC CANCER CENTER
BASOS PCT: 1.4 %
Basophil #: 0.1 x10 3/mm (ref 0.0–0.1)
EOS PCT: 4.5 %
Eosinophil #: 0.4 x10 3/mm (ref 0.0–0.7)
HCT: 38.5 % (ref 35.0–47.0)
HGB: 12.9 g/dL (ref 12.0–16.0)
Lymphocyte #: 1.9 x10 3/mm (ref 1.0–3.6)
Lymphocyte %: 19.9 %
MCH: 33.2 pg (ref 26.0–34.0)
MCHC: 33.6 g/dL (ref 32.0–36.0)
MCV: 99 fL (ref 80–100)
MONO ABS: 1.3 x10 3/mm — AB (ref 0.2–0.9)
MONOS PCT: 13.4 %
NEUTROS ABS: 5.8 x10 3/mm (ref 1.4–6.5)
NEUTROS PCT: 60.8 %
Platelet: 250 x10 3/mm (ref 150–440)
RBC: 3.89 10*6/uL (ref 3.80–5.20)
RDW: 13.7 % (ref 11.5–14.5)
WBC: 9.6 x10 3/mm (ref 3.6–11.0)

## 2014-10-01 LAB — COMPREHENSIVE METABOLIC PANEL
Albumin: 3.8 g/dL (ref 3.4–5.0)
Alkaline Phosphatase: 58 U/L
Anion Gap: 10 (ref 7–16)
BUN: 12 mg/dL (ref 7–18)
Bilirubin,Total: 0.5 mg/dL (ref 0.2–1.0)
CREATININE: 0.81 mg/dL (ref 0.60–1.30)
Calcium, Total: 9.2 mg/dL (ref 8.5–10.1)
Chloride: 104 mmol/L (ref 98–107)
Co2: 27 mmol/L (ref 21–32)
EGFR (Non-African Amer.): >60
Glucose: 111 mg/dL — ABNORMAL HIGH (ref 65–99)
Osmolality: 282 (ref 275–301)
POTASSIUM: 3.7 mmol/L (ref 3.5–5.1)
SGOT(AST): 92 U/L — ABNORMAL HIGH (ref 15–37)
SGPT (ALT): 214 U/L — ABNORMAL HIGH
Sodium: 141 mmol/L (ref 136–145)
TOTAL PROTEIN: 7.4 g/dL (ref 6.4–8.2)

## 2014-10-09 ENCOUNTER — Ambulatory Visit (INDEPENDENT_AMBULATORY_CARE_PROVIDER_SITE_OTHER): Payer: 59 | Admitting: General Surgery

## 2014-10-09 ENCOUNTER — Encounter: Payer: Self-pay | Admitting: General Surgery

## 2014-10-09 VITALS — BP 102/70 | HR 94 | Resp 14 | Ht 64.5 in | Wt 191.0 lb

## 2014-10-09 DIAGNOSIS — C50919 Malignant neoplasm of unspecified site of unspecified female breast: Secondary | ICD-10-CM

## 2014-10-09 NOTE — Patient Instructions (Signed)
Patient to return in 6 months for follow up. The patient is aware to call back for any questions or concerns.  

## 2014-10-09 NOTE — Progress Notes (Signed)
Patient ID: Cassidy Norris, female   DOB: [birth year], 63 y.o.   MRN: [id number]  Chief Complaint  Patient presents with  . Follow-up    6 month follow up breast cancer    HPI TAHJ Cassidy Norris is a 63 y.o. female who presents for a 6 month breast cancer follow up. The patient is doing well. No new problems at this time.   HPI  Past Medical History  Diagnosis Date  . Personal history of tobacco use, presenting hazards to health   . Multiple sclerosis   . Hypertension   . Diffuse cystic mastopathy   . Family history of malignant neoplasm of gastrointestinal tract 2012  . Obesity, unspecified   . Special screening for malignant neoplasms, colon   . Cancer 2014    right breast - Invasive Mammary Carcinoma  . Cancer 2014    left breast - Invasive lobular carcinoma and DCIS   . Fractured elbow 08/28/14    Past Surgical History  Procedure Laterality Date  . Wisdom tooth extraction  2006  . Polypectomy  1989  . Dilation and curettage of uterus  1983  . Colonoscopy  2010    Dr. Jamal Norris  . Tubal ligation    . Portacath placement  2015  . Spine surgery  09/14/14  . Breast biopsy Right 1973,2001  . Breast biopsy Right 11-07-13    INVASIVE MAMMARY CARCINOMA , ER/PR positive Her2 negative  . Breast biopsy Left 11-27-13    invasive lobular and DCIS  . Breast surgery Bilateral 01/09/14    mastectomy    Family History  Problem Relation Age of Onset  . Cancer Mother     lung  . Cancer Father     colon    Social History History  Substance Use Topics  . Smoking status: Former Smoker -- 1.00 packs/day for 30 years    Types: Cigarettes    Quit date: 12/11/2013  . Smokeless tobacco: Never Used  . Alcohol Use: No    No Known Allergies  Current Outpatient Prescriptions  Medication Sig Dispense Refill  . acetaminophen (TYLENOL) 325 MG tablet Take 650 mg by mouth every 6 (six) hours as needed.    . Calcium Carbonate (CALCIUM 600 PO) Take 1 tablet by mouth 2 (two) times daily.     . Cholecalciferol (VITAMIN D PO) Take 1 tablet by mouth daily.    . Cyanocobalamin (VITAMIN B-12) 5000 MCG LOZG Take by mouth daily.    . diphenhydrAMINE (SOMINEX) 25 MG tablet Take 25 mg by mouth at bedtime as needed for sleep.    Marland Kitchen gabapentin (NEURONTIN) 100 MG capsule Take 200 mg by mouth every other day.     . ibuprofen (ADVIL,MOTRIN) 200 MG tablet Take 400 mg by mouth every 6 (six) hours as needed.    . Melatonin 3 MG TABS Take 1 tablet by mouth daily.    . traZODone (DESYREL) 50 MG tablet Take 50 mg by mouth at bedtime.     No current facility-administered medications for this visit.    Review of Systems Review of Systems  Constitutional: Negative.   Respiratory: Negative.   Cardiovascular: Negative.     Blood pressure 102/70, pulse 94, resp. rate 14, height 5' 4.5" (1.638 m), weight 191 lb (86.637 kg).  Physical Exam Physical Exam  Constitutional: She is oriented to person, place, and time. She appears well-developed and well-nourished.  Eyes: Conjunctivae are normal. No scleral icterus.  Neck: Neck supple. No thyromegaly present.  Cardiovascular:  Normal rate, regular rhythm and normal heart sounds.   No murmur heard. Pulmonary/Chest: Effort normal and breath sounds normal.  Bilateral mastectomy sites well healed. No signs of reccurance.   Abdominal: Soft. Normal appearance and bowel sounds are normal. There is no hepatosplenomegaly. There is no tenderness. No hernia.  Lymphadenopathy:    She has no cervical adenopathy.  Neurological: She is alert and oriented to person, place, and time.  Skin: Skin is warm and dry.    Data Reviewed Prior notes.   Assessment    Pt is s/p bilateral mastectomy and chemo for multifocal bilateral breast CA. Now on antihormonal therapy.      Plan    Next check in 6 mos.        Itzell Bendavid G 10/09/2014, 7:10 PM

## 2014-10-22 LAB — ALT: SGPT (ALT): 122 U/L — ABNORMAL HIGH

## 2014-10-22 LAB — SGOT (AST)(ARMC): AST: 58 U/L — AB (ref 15–37)

## 2014-10-23 ENCOUNTER — Ambulatory Visit: Payer: Self-pay | Admitting: Oncology

## 2014-11-07 LAB — HEPATIC FUNCTION PANEL A (ARMC)
AST: 42 U/L — AB (ref 15–37)
Albumin: 3.9 g/dL (ref 3.4–5.0)
Alkaline Phosphatase: 70 U/L
BILIRUBIN DIRECT: 0.1 mg/dL (ref 0.0–0.2)
Bilirubin,Total: 0.3 mg/dL (ref 0.2–1.0)
SGPT (ALT): 83 U/L — ABNORMAL HIGH
Total Protein: 7.5 g/dL (ref 6.4–8.2)

## 2014-11-23 ENCOUNTER — Ambulatory Visit: Payer: Self-pay | Admitting: Oncology

## 2014-11-29 ENCOUNTER — Ambulatory Visit (INDEPENDENT_AMBULATORY_CARE_PROVIDER_SITE_OTHER): Payer: BLUE CROSS/BLUE SHIELD | Admitting: General Surgery

## 2014-11-29 ENCOUNTER — Encounter: Payer: Self-pay | Admitting: General Surgery

## 2014-11-29 VITALS — BP 140/88 | HR 86 | Resp 14 | Ht 65.0 in | Wt 194.0 lb

## 2014-11-29 DIAGNOSIS — C50919 Malignant neoplasm of unspecified site of unspecified female breast: Secondary | ICD-10-CM

## 2014-11-29 NOTE — Progress Notes (Signed)
Patient ID: Cassidy Norris, female   DOB: 1951/03/28, 64 y.o.   MRN: [id number]  The patient presents for a procedure today. The procedure to be performed is a port removal. Consent was obtained.    Procedure: Removal venous access port.   Anesthetic: 10 ml 1% xylocaine mixed with 0.5% marcaine.  Prep: Chloro Prep  After prep and drape local anesthesia instilled. Prior incision over prt was reopened and capsule around the port opened. Tethering prolene stitches were removed. Port and cathetr removed in entirety.  Deeper layer closed with 3-0 vicryl. Skin closed with 4-0 vicryl subcuticular stitch.  Steri strips applied. Telfa and tegaderm dressing.  No immediate problems from procedure.

## 2014-11-29 NOTE — Patient Instructions (Signed)
The patient is aware to call back for any questions or concerns.  

## 2014-12-02 ENCOUNTER — Encounter: Payer: Self-pay | Admitting: General Surgery

## 2014-12-27 ENCOUNTER — Ambulatory Visit: Payer: Self-pay | Admitting: Oncology

## 2015-02-14 ENCOUNTER — Encounter: Payer: Self-pay | Admitting: *Deleted

## 2015-02-19 ENCOUNTER — Encounter: Payer: Self-pay | Admitting: *Deleted

## 2015-02-19 ENCOUNTER — Ambulatory Visit (INDEPENDENT_AMBULATORY_CARE_PROVIDER_SITE_OTHER): Payer: BLUE CROSS/BLUE SHIELD | Admitting: Neurology

## 2015-02-19 ENCOUNTER — Encounter: Payer: Self-pay | Admitting: Neurology

## 2015-02-19 VITALS — BP 116/70 | HR 118 | Temp 98.4°F | Resp 20 | Ht 64.0 in | Wt 196.8 lb

## 2015-02-19 DIAGNOSIS — G35 Multiple sclerosis: Secondary | ICD-10-CM

## 2015-02-19 DIAGNOSIS — G620 Drug-induced polyneuropathy: Secondary | ICD-10-CM

## 2015-02-19 DIAGNOSIS — T451X5A Adverse effect of antineoplastic and immunosuppressive drugs, initial encounter: Secondary | ICD-10-CM | POA: Diagnosis not present

## 2015-02-19 DIAGNOSIS — Z9889 Other specified postprocedural states: Secondary | ICD-10-CM

## 2015-02-19 NOTE — Progress Notes (Signed)
NEUROLOGY CONSULTATION NOTE  Cassidy Norris MRN: [id number] DOB: 25-Apr-1951  Referring provider: Dr. Oliva Bustard Primary care provider: Dr. Jeananne Rama  Reason for consult:  MS  HISTORY OF PRESENT ILLNESS: Cassidy Norris is a 64 year old right-handed woman with multiple sclerosis, former smoker, status post bilateral breast cancer Dec 27, 2013 status post bilateral mastectomy and status post L4-5 laminectomy who presents to establish care regarding multiple sclerosis.  Records, limited labs and MRI reports reviewed.  Her husband passed away due to [place] in December 27, 1998.  In November 2001, she tripped and fell, hitting her head.  She went to the ED where a CT of the head was reportedly unremarkable.  She was advised to have an MRI of the brain, which was performed on 12/12/00.  It revealed small round and elliptical white matter lesions less than 1 cm in the periventricular region.  In December of 2002, she followed up with a neurologist, Dr. Corinna Capra.  VEP performed in 12-27-01 showed prolonged p100 latency in the right optic nerve.  BAER was normal.  She reportedly had a lumbar puncture where CSF results were supportive of MS.  She was given 5 days of Solu-Medrol followed by 3 weeks of oral prednisone.  She was advised to start an interferon, but due to possible side effects, she decided to hold off and see if she clinically progresses.  She did have a follow up MRI of the brain in December 2004, which reportedly showed an enhancing lesion as well as a remote lesion in the cerebellum in addition to the periventricular white matter lesions.  She never had any noticeable clinical flare-ups and had done well off of a disease-modifying agent.  Over the past 3 years, she notes possible worsening in depth perception.  Sometimes she notes difficulty grabbing for objects or will drive too close to the curb.  In 12-27-13, she was diagnosed with bilateral breast cancer.  She underwent bilateral mastectomy and chemotherapy, which ended in  September.  As a result of the chemotherapy, she has neuropathy in the feet.  She also had problems with her left leg and was found to have a ruptured disc at L4-5 and underwent surgery in October 2015.  Since chemotherapy, she notes some mild memory problems.  She sometimes misplaces objects or will forget if she locked the front door when she left.  She will sometimes have difficulty getting a word out.  PAST MEDICAL HISTORY: Past Medical History  Diagnosis Date  . Personal history of tobacco use, presenting hazards to health   . Multiple sclerosis   . Hypertension   . Diffuse cystic mastopathy   . Family history of malignant neoplasm of gastrointestinal tract December 27, 2010  . Obesity, unspecified   . Special screening for malignant neoplasms, colon   . Cancer 2012-12-27    right breast - Invasive Mammary Carcinoma  . Cancer 12/27/12    left breast - Invasive lobular carcinoma and DCIS   . Fractured elbow 08/28/14    PAST SURGICAL HISTORY: Past Surgical History  Procedure Laterality Date  . Wisdom tooth extraction  2004-12-27  . Polypectomy  1989  . Dilation and curettage of uterus  1983  . Colonoscopy  December 27, 2008    Dr. Jamal Collin  . Tubal ligation    . Portacath placement  12/27/2013  . Spine surgery  09/14/14  . Breast biopsy Right 1971/12/28  . Breast biopsy Right 11-07-13    INVASIVE MAMMARY CARCINOMA , ER/PR positive Her2 negative  . Breast biopsy Left 11-27-13  invasive lobular and DCIS  . Breast surgery Bilateral 01/09/14    mastectomy    MEDICATIONS: Current Outpatient Prescriptions on File Prior to Visit  Medication Sig Dispense Refill  . acetaminophen (TYLENOL) 325 MG tablet Take 650 mg by mouth every 6 (six) hours as needed.    . Calcium Carbonate (CALCIUM 600 PO) Take 1 tablet by mouth 2 (two) times daily.    . Cholecalciferol (VITAMIN D PO) Take 1 tablet by mouth daily.    . Cyanocobalamin (VITAMIN B-12) 5000 MCG LOZG Take by mouth daily.    . diphenhydrAMINE (SOMINEX) 25 MG tablet Take 25 mg by  mouth at bedtime as needed for sleep.    . ibuprofen (ADVIL,MOTRIN) 200 MG tablet Take 400 mg by mouth every 6 (six) hours as needed.    . Melatonin 3 MG TABS Take 1 tablet by mouth daily.    . traZODone (DESYREL) 50 MG tablet Take 50 mg by mouth at bedtime.    . Vitamin D, Cholecalciferol, 400 UNITS CAPS Take by mouth.    . gabapentin (NEURONTIN) 100 MG capsule Take 200 mg by mouth every other day.     . Glucosamine-Chondroit-Vit C-Mn (GLUCOSAMINE 1500 COMPLEX) CAPS Take by mouth.    . Omega-3 Fatty Acids (FISH OIL PO) Take 1 capsule by mouth 2 (two) times daily.     No current facility-administered medications on file prior to visit.    ALLERGIES: No Known Allergies  FAMILY HISTORY: Family History  Problem Relation Age of Onset  . Cancer Mother     lung  . Cancer Father     colon  . Diabetes Maternal Grandmother     SOCIAL HISTORY: History   Social History  . Marital Status: Widowed    Spouse Name: N/A  . Number of Children: N/A  . Years of Education: N/A   Occupational History  . Not on file.   Social History Main Topics  . Smoking status: Former Smoker -- 1.00 packs/day for 30 years    Types: Cigarettes    Quit date: 12/11/2013  . Smokeless tobacco: Never Used  . Alcohol Use: 0.0 oz/week    0 Standard drinks or equivalent per week     Comment: social  . Drug Use: No  . Sexual Activity: No   Other Topics Concern  . Not on file   Social History Narrative    REVIEW OF SYSTEMS: Constitutional: No fevers, chills, or sweats, no generalized fatigue, change in appetite Eyes: No visual changes, double vision, eye pain Ear, nose and throat: No hearing loss, ear pain, nasal congestion, sore throat Cardiovascular: No chest pain, palpitations Respiratory:  No shortness of breath at rest or with exertion, wheezes GastrointestinaI: No nausea, vomiting, diarrhea, abdominal pain, fecal incontinence Genitourinary:  No dysuria, urinary retention or  frequency Musculoskeletal:  No neck pain, back pain Integumentary: No rash, pruritus, skin lesions Neurological: as above Psychiatric: No depression, insomnia, anxiety Endocrine: No palpitations, fatigue, diaphoresis, mood swings, change in appetite, change in weight, increased thirst Hematologic/Lymphatic:  No anemia, purpura, petechiae. Allergic/Immunologic: no itchy/runny eyes, nasal congestion, recent allergic reactions, rashes  PHYSICAL EXAM: Filed Vitals:   02/19/15 1525  BP: 116/70  Pulse: 118  Temp: 98.4 F (36.9 C)  Resp: 20   General: No acute distress Head:  Normocephalic/atraumatic Eyes:  fundi unremarkable, without vessel changes, exudates, hemorrhages or papilledema. Neck: supple, no paraspinal tenderness, full range of motion Back: No paraspinal tenderness Heart: regular rate and rhythm Lungs: Clear to auscultation   bilaterally. Vascular: No carotid bruits. Neurological Exam: Mental status: alert and oriented to person, place, and time, recent and remote memory intact, fund of knowledge intact, attention and concentration intact, speech fluent and not dysarthric, language intact. MMSE - Mini Mental State Exam 02/19/2015  Orientation to time 5  Orientation to Place 5  Registration 3  Attention/ Calculation 4  Recall 3  Language- name 2 objects 2  Language- repeat 1  Language- follow 3 step command 3  Language- read & follow direction 1  Write a sentence 1  Copy design 1  Total score 29   Cranial nerves: CN I: not tested CN II: pupils equal, round and reactive to light, visual fields intact, fundi unremarkable, without vessel changes, exudates, hemorrhages or papilledema. CN III, IV, VI:  full range of motion, no nystagmus, no ptosis CN V: facial sensation intact CN VII: upper and lower face symmetric CN VIII: hearing intact CN IX, X: gag intact, uvula midline CN XI: sternocleidomastoid and trapezius muscles intact CN XII: tongue midline Bulk & Tone:  normal, no fasciculations. Motor:  5-/5 left hip flexion and knee extension.  Otherwise 5/5. Sensation:  Reduced pinprick sensation over dorsum of left foot and ankle.  Reduced vibration sensation in feet up to below the knees. Deep Tendon Reflexes:  2+ throughout, toes downgoing Finger to nose testing:  No dysmetria. Heel to shin:  No dysmetria Gait:  Normal station and stride.  Able to turn and walk in tandem. Romberg negative.  IMPRESSION: Multiple sclerosis.  Very benign course that really hasn't been symptomatic.   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  We will see what the MRIs show and discuss afterwards. 1.  Will check baseline MRI of brain and cervical spine with and without contrast 2.  Will check vitamin D level.  Already taking 400 units daily 3.  Follow up after MRIs to discuss management.  Thank you for allowing me to take part in the care of this patient.  Metta Clines, DO  CC:  Golden Pop, MD  Forest Gleason, MD

## 2015-02-19 NOTE — Patient Instructions (Addendum)
1.  We will check MRI of the brain and cervical spine with and without contrast  [facility] 03/12/15 12:45 pm  2.  We will check vitamin D level 3.  Follow up soon after MRI.

## 2015-02-20 LAB — VITAMIN D 25 HYDROXY (VIT D DEFICIENCY, FRACTURES): Vit D, 25-Hydroxy: 37 ng/mL (ref 30–100)

## 2015-02-21 ENCOUNTER — Telehealth: Payer: Self-pay | Admitting: *Deleted

## 2015-02-21 NOTE — Telephone Encounter (Signed)
-----   Message from Pieter Partridge, DO sent at 02/21/2015  7:15 AM EDT ----- Vitamin D result is 37, which is normal (should be above 30).  However, I would prefer it to be at least 50.  I would recommend increasing the vitamin D3 from 400 units to 1000 units daily. ----- Message -----    From: Lab in [facility] Interface    Sent: 02/20/2015   1:15 AM      To: Pieter Partridge, DO

## 2015-02-21 NOTE — Telephone Encounter (Signed)
Patient is aware of Vit D level she will start 1000 units today

## 2015-03-11 ENCOUNTER — Ambulatory Visit
Admit: 2015-03-11 | Disposition: A | Discharge status: Home / Self Care | Payer: Self-pay | Attending: Oncology | Admitting: Oncology

## 2015-03-11 LAB — COMPREHENSIVE METABOLIC PANEL
ALBUMIN: 4.2 g/dL
ALK PHOS: 68 U/L
Anion Gap: 8 (ref 7–16)
BUN: 13 mg/dL
Bilirubin,Total: 0.7 mg/dL
CALCIUM: 9.2 mg/dL
CO2: 28 mmol/L
Chloride: 103 mmol/L
Creatinine: 0.69 mg/dL
EGFR (African American): >60
EGFR (Non-African Amer.): >60
Glucose: 118 mg/dL — ABNORMAL HIGH
Potassium: 3.4 mmol/L — ABNORMAL LOW
SGOT(AST): 28 U/L
SGPT (ALT): 32 U/L
Sodium: 139 mmol/L
Total Protein: 6.9 g/dL

## 2015-03-11 LAB — CBC CANCER CENTER
BASOS PCT: 0.9 %
Basophil #: 0.1 x10 3/mm (ref 0.0–0.1)
EOS ABS: 0.3 x10 3/mm (ref 0.0–0.7)
EOS PCT: 2.9 %
HCT: 42.2 % (ref 35.0–47.0)
HGB: 14.2 g/dL (ref 12.0–16.0)
Lymphocyte #: 2.3 x10 3/mm (ref 1.0–3.6)
Lymphocyte %: 24.8 %
MCH: 31.3 pg (ref 26.0–34.0)
MCHC: 33.6 g/dL (ref 32.0–36.0)
MCV: 93 fL (ref 80–100)
MONO ABS: 1 x10 3/mm — AB (ref 0.2–0.9)
Monocyte %: 11.1 %
NEUTROS ABS: 5.6 x10 3/mm (ref 1.4–6.5)
Neutrophil %: 60.3 %
Platelet: 217 x10 3/mm (ref 150–440)
RBC: 4.53 10*6/uL (ref 3.80–5.20)
RDW: 13.8 % (ref 11.5–14.5)
WBC: 9.3 x10 3/mm (ref 3.6–11.0)

## 2015-03-12 ENCOUNTER — Ambulatory Visit (HOSPITAL_COMMUNITY): Admission: RE | Admit: 2015-03-12 | Payer: BLUE CROSS/BLUE SHIELD | Source: Ambulatory Visit

## 2015-03-12 ENCOUNTER — Ambulatory Visit (HOSPITAL_COMMUNITY)
Admission: RE | Admit: 2015-03-12 | Discharge: 2015-03-12 | Disposition: A | Discharge status: Home / Self Care | Payer: BLUE CROSS/BLUE SHIELD | Source: Ambulatory Visit | Attending: Neurology | Admitting: Neurology

## 2015-03-12 DIAGNOSIS — Z853 Personal history of malignant neoplasm of breast: Secondary | ICD-10-CM | POA: Diagnosis not present

## 2015-03-12 DIAGNOSIS — H539 Unspecified visual disturbance: Secondary | ICD-10-CM | POA: Insufficient documentation

## 2015-03-12 DIAGNOSIS — M4802 Spinal stenosis, cervical region: Secondary | ICD-10-CM | POA: Diagnosis not present

## 2015-03-12 DIAGNOSIS — G35 Multiple sclerosis: Secondary | ICD-10-CM

## 2015-03-12 LAB — CREATININE, SERUM: Creatinine, Ser: 0.71 mg/dL (ref 0.50–1.10)

## 2015-03-12 MED ORDER — GADOBENATE DIMEGLUMINE 529 MG/ML IV SOLN
15.0000 mL | Freq: Once | INTRAVENOUS | Status: AC | PRN
  Administered 2015-03-12: 15 mL via INTRAVENOUS

## 2015-03-16 NOTE — Op Note (Signed)
PATIENT NAME:  Cassidy Norris, HEGWOOD MR#:  [id number] DATE OF BIRTH:  [birth year]  DATE OF PROCEDURE:  01/09/2014  PREOPERATIVE DIAGNOSIS: Bilateral multifocal breast carcinoma.   POSTOPERATIVE DIAGNOSIS:  Bilateral multifocal breast carcinoma.   OPERATION PERFORMED: Bilateral total mastectomy and bilateral sentinel node biopsy.   SURGEON: Mckinley Jewel, M.D.   ANESTHESIA: General.   COMPLICATIONS: None.   ESTIMATED BLOOD LOSS: Approximately 150 to 200 mL.   DRAINS: Blake drain on each side.   DESCRIPTION OF PROCEDURE: The patient was put to sleep in the supine position on the operating table. Both breasts and axilla were then prepped and draped out as a sterile field. The patient had undergone preoperative nuclear contrast injection in both breasts. The gamma finder showed marked signal activity in both axillae for identification of sentinel node. After a timeout was performed, the right side was operated on first. After the skin incisions were mapped out on both sides, the superior aspect of the right breast incision was made near the axillary portion.  The skin and subcutaneous tissue were then elevated and dissected down to expose the inferior aspect of the axilla. The gamma finder was then utilized and two sentinel nodes were identified with signal activity. These were excised and sent off as sentinel nodes 1 and 2.  The signal activity died completely after removal of these 2 nodes in the axilla.  In the course of dissection, a nonsentinel node located within the axillary tail of the breast was palpated, and this was separately removed and sent off as nonsentinel node. The skin incisions in the superior and inferior aspect, were then completed, and the skin and subcutaneous tissue were elevated on the superior aspect down to the infraclavicular region and inferiorly to the upper rectus fascia. Bleeding was controlled with cautery and ligatures of 3-0 Vicryl. The breast along with the pectoral  fascia was then dissected off the underlying pectoral muscle in the medial to the lateral aspect and the lateral ends were then freed and the lateral end of the specimen was tagged for identification for the pathologist. Following this, the area was irrigated with some saline. After ensuring hemostasis, a Blake drain was positioned and brought out through a stab incision inferiorly and fastened to the skin with a nylon stitch. The subcutaneous tissue was then approximated with interrupted stitches of 2-0 Vicryl and the skin was closed with 2 running subcuticular stitches of 3-0 Monocryl. Following this, the left side was operated on in a similar fashion. After opening the superior flap towards the axilla, a single sentinel node with activity was identified. This was removed and sent off. No other palpable nodes were noted and no other signal activity was identified in the left axilla. After the skin incision was completed, the dissection was performed similar to the right with upper extent below the clavicle and lower extent in the upper rectus fascia. The breast gland on the pectoral fascia was then dissected off the underlying pectoral muscle from the medial to the lateral aspect and removed. The wound was irrigated and after ensuring hemostasis, the area was drained with a Blake drain similar to the right. The left breast was also tagged with the lateral margin for pathologic identification. The subcutaneous tissue was closed with interrupted 2-0 Vicryl stitches and the skin with subcuticular 3-0 Monocryl. Both incisions were covered with Dermabond. A padded dressing with fluffs and ABD and a surgical bra was then utilized. The patient tolerated the procedure well with no immediate problems  encountered. She was subsequently extubated and returned to the recovery room in stable condition.   ____________________________ S.Robinette Haines, MD sgs:dp D: 01/10/2014 08:21:24 ET T: 01/10/2014 08:45:21  ET JOB#: [id number]  cc: Synthia Innocent. Jamal Collin, MD, <Dictator> [facility] Robinette Haines MD ELECTRONICALLY SIGNED 01/12/2014 11:05

## 2015-03-16 NOTE — Op Note (Signed)
PATIENT NAME:  TIMBER, MARSHMAN MR#:  [id number] DATE OF BIRTH:  [birth year]  DATE OF PROCEDURE:  02/26/2014  PREOPERATIVE DIAGNOSIS: Carcinoma of the breast, multifocal, status post bilateral mastectomy.   POSTOPERATIVE DIAGNOSIS:  Carcinoma of the breast, multifocal, status post bilateral mastectomy.   OPERATION: Insertion of venous access port with ultrasound and fluoroscopic guidance.   SURGEON: Mckinley Jewel, M.D.   ANESTHESIA: General.   COMPLICATIONS: None.   ESTIMATED BLOOD LOSS: Minimal.   DRAINS: None.   PROCEDURE: The patient was placed in the supine position on the operating table. Anesthesia was obtained with an LMA. The right upper chest and neck area were prepped and draped out as a sterile field. Ultrasound probe was brought up to the field. Timeout was performed. Ultrasound was used to locate the subclavian vein beneath the lateral end of the clavicle and, after instillation of local anesthetic containing 0.5% Marcaine and 1% Xylocaine, a small skin incision was made and with ultrasound guidance a needle was successfully introduced into the subclavian vein with free withdrawal of blood. Following this, a Seldinger technique was used and the catheter was positioned going towards the superior vena cava. This was achieved with fluoroscopic guidance. The skin mark was at 20 cm. A subcutaneous pocket was created over the second costal cartilage area where a local anesthetic was instilled. A skin incision was made and the skin and subcutaneous tissue elevated to obtain a pocket for the port. Cautery was used for control of bleeding. The catheter was tunneled through to this site and cut to approximate length and then fixed to a prefilled port. The port was positioned in the pocket and anchored to the underlying fascia with 3 stitches of 2-0 Prolene. Fluoroscopy was again performed to ensure there was no kink in the catheter and that the tip was in the superior vena cava. The subcutaneous  tissue was closed with 3-0 Vicryl, skin with subcuticular 4-0 Vicryl, covered with Dermabond. The procedure was well tolerated. She was subsequently returned to the recovery room in stable condition.   ____________________________ S.Robinette Haines, MD sgs:cs D: 02/27/2014 12:58:00 ET T: 02/27/2014 14:13:49 ET JOB#: [id number]  cc: S.G. Jamal Collin, MD, <Dictator> [facility] Robinette Haines MD ELECTRONICALLY SIGNED 02/28/2014 7:50

## 2015-03-19 ENCOUNTER — Encounter: Payer: Self-pay | Admitting: Neurology

## 2015-03-19 ENCOUNTER — Ambulatory Visit (INDEPENDENT_AMBULATORY_CARE_PROVIDER_SITE_OTHER): Payer: BLUE CROSS/BLUE SHIELD | Admitting: Neurology

## 2015-03-19 VITALS — BP 130/72 | HR 106 | Resp 20 | Ht 64.0 in | Wt 195.5 lb

## 2015-03-19 DIAGNOSIS — T451X5A Adverse effect of antineoplastic and immunosuppressive drugs, initial encounter: Secondary | ICD-10-CM

## 2015-03-19 DIAGNOSIS — G620 Drug-induced polyneuropathy: Secondary | ICD-10-CM | POA: Insufficient documentation

## 2015-03-19 DIAGNOSIS — M5416 Radiculopathy, lumbar region: Secondary | ICD-10-CM | POA: Diagnosis not present

## 2015-03-19 DIAGNOSIS — G35 Multiple sclerosis: Secondary | ICD-10-CM

## 2015-03-19 DIAGNOSIS — G622 Polyneuropathy due to other toxic agents: Secondary | ICD-10-CM | POA: Diagnosis not present

## 2015-03-19 NOTE — Patient Instructions (Signed)
The MS seems inactive to me and I think your symptoms are related to the chemo and your back.  I would continue to monitor.  I would like you to increase vitamin D to 2000 units daily.  We will recheck a level in 6 months with follow up soon after.

## 2015-03-19 NOTE — Progress Notes (Signed)
NEUROLOGY FOLLOW UP OFFICE NOTE  Cassidy Norris [id number]  HISTORY OF PRESENT ILLNESS: Cassidy Norris is a 64 year old right-handed woman with multiple sclerosis, former smoker, status post bilateral breast cancer 02/22/14 status post bilateral mastectomy and status post L4-5 laminectomy who follows up for multiple sclerosis.  She is accompanied by a friend.  MRI of brain and cervical spine and vitamin D level reviewed.  UPDATE: She had an MRI of the brain and cervical spine with and without contrast on 03/12/15, which showed non-enhancing bilateral periventricular white matter lesions oriented perpendicular to the ventricles and corpus callosum, as well as involving temporal lobe white matter and mild involvement of the left side of cerebellum.  No cervical cord lesions were seen.  Vitamin D level from last month was 37.  She was advised to increase D from 400 u to 1000 u daily.   HISTORY: Her husband passed away due to [place] in 02/23/99.  In November 2001, she tripped and fell, hitting her head.  She went to the ED where a CT of the head was reportedly unremarkable.  She was advised to have an MRI of the brain, which was performed on 12/12/00.  It revealed small round and elliptical white matter lesions less than 1 cm in the periventricular region.  In December of 2002, she followed up with a neurologist, Dr. Corinna Capra.  VEP performed in February 22, 2002 showed prolonged p100 latency in the right optic nerve.  BAER was normal.  She reportedly had a lumbar puncture where CSF results were supportive of MS.  She was given 5 days of Solu-Medrol followed by 3 weeks of oral prednisone.  She was advised to start an interferon, but due to possible side effects, she decided to hold off and see if she clinically progresses.  She did have a follow up MRI of the brain in December 2004, which reportedly showed an enhancing lesion as well as a remote lesion in the cerebellum in addition to the periventricular white matter lesions.   She never had any noticeable clinical flare-ups and had done well off of a disease-modifying agent.  Over the past 3 years, she notes possible worsening in depth perception.  Sometimes she notes difficulty grabbing for objects or will drive too close to the curb.  In 2014/02/22, she was diagnosed with bilateral breast cancer.  She underwent bilateral mastectomy and chemotherapy, which ended in September.  As a result of the chemotherapy, she has neuropathy in the feet.  She also had problems with her left leg and was found to have a ruptured disc at L4-5 and underwent surgery in October 2015.  Since chemotherapy, she notes some mild memory problems.  She sometimes misplaces objects or will forget if she locked the front door when she left.  She will sometimes have difficulty getting a word out.  PAST MEDICAL HISTORY: Past Medical History  Diagnosis Date  . Personal history of tobacco use, presenting hazards to health   . Multiple sclerosis   . Hypertension   . Diffuse cystic mastopathy   . Family history of malignant neoplasm of gastrointestinal tract Feb 23, 2011  . Obesity, unspecified   . Special screening for malignant neoplasms, colon   . Cancer February 22, 2013    right breast - Invasive Mammary Carcinoma  . Cancer 02/22/13    left breast - Invasive lobular carcinoma and DCIS   . Fractured elbow 08/28/14    MEDICATIONS: Current Outpatient Prescriptions on File Prior to Visit  Medication Sig Dispense Refill  .  acetaminophen (TYLENOL) 325 MG tablet Take 650 mg by mouth every 6 (six) hours as needed.    . Calcium Carbonate (CALCIUM 600 PO) Take 1 tablet by mouth 2 (two) times daily.    . Cholecalciferol (VITAMIN D PO) Take 1 tablet by mouth daily.    . Cyanocobalamin (VITAMIN B-12) 5000 MCG LOZG Take by mouth daily.    . diphenhydrAMINE (SOMINEX) 25 MG tablet Take 25 mg by mouth at bedtime as needed for sleep.    Marland Kitchen gabapentin (NEURONTIN) 100 MG capsule Take 200 mg by mouth every other day.     .  Glucosamine-Chondroit-Vit C-Mn (GLUCOSAMINE 1500 COMPLEX) CAPS Take by mouth.    Marland Kitchen ibuprofen (ADVIL,MOTRIN) 200 MG tablet Take 400 mg by mouth every 6 (six) hours as needed.    Marland Kitchen letrozole (FEMARA) 2.5 MG tablet   2  . Melatonin 3 MG TABS Take 1 tablet by mouth daily.    . Omega-3 Fatty Acids (FISH OIL PO) Take 1 capsule by mouth 2 (two) times daily.    . traZODone (DESYREL) 50 MG tablet Take 50 mg by mouth at bedtime.    . Vitamin D, Cholecalciferol, 400 UNITS CAPS Take by mouth.     No current facility-administered medications on file prior to visit.    ALLERGIES: No Known Allergies  FAMILY HISTORY: Family History  Problem Relation Age of Onset  . Cancer Mother     lung  . Cancer Father     colon  . Diabetes Maternal Grandmother     SOCIAL HISTORY: History   Social History  . Marital Status: Widowed    Spouse Name: N/A  . Number of Children: N/A  . Years of Education: N/A   Occupational History  . Not on file.   Social History Main Topics  . Smoking status: Former Smoker -- 1.00 packs/day for 30 years    Types: Cigarettes    Quit date: 12/11/2013  . Smokeless tobacco: Never Used  . Alcohol Use: 0.0 oz/week    0 Standard drinks or equivalent per week     Comment: social  . Drug Use: No  . Sexual Activity: No   Other Topics Concern  . Not on file   Social History Narrative    REVIEW OF SYSTEMS: Constitutional: No fevers, chills, or sweats, no generalized fatigue, change in appetite Eyes: No visual changes, double vision, eye pain Ear, nose and throat: No hearing loss, ear pain, nasal congestion, sore throat Cardiovascular: No chest pain, palpitations Respiratory:  No shortness of breath at rest or with exertion, wheezes GastrointestinaI: No nausea, vomiting, diarrhea, abdominal pain, fecal incontinence Genitourinary:  No dysuria, urinary retention or frequency Musculoskeletal:  No neck pain, back pain Integumentary: No rash, pruritus, skin  lesions Neurological: as above Psychiatric: No depression, insomnia, anxiety Endocrine: No palpitations, fatigue, diaphoresis, mood swings, change in appetite, change in weight, increased thirst Hematologic/Lymphatic:  No anemia, purpura, petechiae. Allergic/Immunologic: no itchy/runny eyes, nasal congestion, recent allergic reactions, rashes  PHYSICAL EXAM: Filed Vitals:   03/19/15 0745  BP: 130/72  Pulse: 106  Resp: 20   General: No acute distress Head:  Normocephalic/atraumatic  IMPRESSION: 1.  Relapsing-remitting MS.  I feel she had a benign course and has likely burned out.  She has not been on a disease-modifying agent and has done well over the past 15 years.  Her current MRI shows characteristic MS plaques, however there is no active or evidence of widespread disease.  I think her symptoms involving her  legs and speech are more likely related to the chemotherapy since they started after the treatment.  Some of her leg problems could be due to the back as well. 2.  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  Lumbar radiculopathy status post surgery.  PLAN: Since she has had such a benign course which may have burnt out, we will not initiate a disease-modifying agent and will instead monitor. I advised that she increase the D3 further up to 2000 units. She will repeat vitamin D level in 6 months with follow up soon after.  15 minutes spent with patient, 100% spent discussing results, disease process and plan  Metta Clines, DO  CC:  Golden Pop, MD

## 2015-03-27 ENCOUNTER — Other Ambulatory Visit: Payer: Self-pay | Admitting: *Deleted

## 2015-03-27 ENCOUNTER — Telehealth: Payer: Self-pay | Admitting: *Deleted

## 2015-03-27 DIAGNOSIS — G35 Multiple sclerosis: Secondary | ICD-10-CM

## 2015-03-27 DIAGNOSIS — C50912 Malignant neoplasm of unspecified site of left female breast: Secondary | ICD-10-CM

## 2015-03-27 DIAGNOSIS — G35D Multiple sclerosis, unspecified: Secondary | ICD-10-CM

## 2015-03-27 NOTE — Telephone Encounter (Signed)
Patient stated she needs a order and diagnosis sent to her oncologist so she can get her vit D levels checked Call back number [phone]

## 2015-03-27 NOTE — Telephone Encounter (Signed)
Lab slipped faxed to [phone]

## 2015-03-28 ENCOUNTER — Other Ambulatory Visit: Payer: Self-pay

## 2015-03-28 DIAGNOSIS — C50919 Malignant neoplasm of unspecified site of unspecified female breast: Secondary | ICD-10-CM

## 2015-04-08 ENCOUNTER — Encounter: Payer: Self-pay | Admitting: General Surgery

## 2015-04-08 ENCOUNTER — Ambulatory Visit (INDEPENDENT_AMBULATORY_CARE_PROVIDER_SITE_OTHER): Payer: BLUE CROSS/BLUE SHIELD | Admitting: General Surgery

## 2015-04-08 ENCOUNTER — Telehealth: Payer: Self-pay | Admitting: *Deleted

## 2015-04-08 VITALS — BP 130/74 | HR 110 | Resp 14 | Ht 64.0 in | Wt 199.0 lb

## 2015-04-08 DIAGNOSIS — Z800 Family history of malignant neoplasm of digestive organs: Secondary | ICD-10-CM

## 2015-04-08 DIAGNOSIS — Z86010 Personal history of colonic polyps: Secondary | ICD-10-CM

## 2015-04-08 DIAGNOSIS — C50911 Malignant neoplasm of unspecified site of right female breast: Secondary | ICD-10-CM

## 2015-04-08 DIAGNOSIS — C50912 Malignant neoplasm of unspecified site of left female breast: Secondary | ICD-10-CM | POA: Diagnosis not present

## 2015-04-08 DIAGNOSIS — R000 Tachycardia, unspecified: Secondary | ICD-10-CM

## 2015-04-08 NOTE — Patient Instructions (Signed)
Colonoscopy  A colonoscopy is an exam to look at the entire large intestine (colon). This exam can help find problems such as tumors, polyps, inflammation, and areas of bleeding. The exam takes about 1 hour.   LET YOUR HEALTH CARE PROVIDER KNOW ABOUT:   · Any allergies you have.  · All medicines you are taking, including vitamins, herbs, eye drops, creams, and over-the-counter medicines.  · Previous problems you or members of your family have had with the use of anesthetics.  · Any blood disorders you have.  · Previous surgeries you have had.  · Medical conditions you have.  RISKS AND COMPLICATIONS   Generally, this is a safe procedure. However, as with any procedure, complications can occur. Possible complications include:  · Bleeding.  · Tearing or rupture of the colon wall.  · Reaction to medicines given during the exam.  · Infection (rare).  BEFORE THE PROCEDURE   · Ask your health care provider about changing or stopping your regular medicines.  · You may be prescribed an oral bowel prep. This involves drinking a large amount of medicated liquid, starting the day before your procedure. The liquid will cause you to have multiple loose stools until your stool is almost clear or light green. This cleans out your colon in preparation for the procedure.  · Do not eat or drink anything else once you have started the bowel prep, unless your health care provider tells you it is safe to do so.  · Arrange for someone to drive you home after the procedure.  PROCEDURE   · You will be given medicine to help you relax (sedative).  · You will lie on your side with your knees bent.  · A long, flexible tube with a light and camera on the end (colonoscope) will be inserted through the rectum and into the colon. The camera sends video back to a computer screen as it moves through the colon. The colonoscope also releases carbon dioxide gas to inflate the colon. This helps your health care provider see the area better.  · During  the exam, your health care provider may take a small tissue sample (biopsy) to be examined under a microscope if any abnormalities are found.  · The exam is finished when the entire colon has been viewed.  AFTER THE PROCEDURE   · Do not drive for 24 hours after the exam.  · You may have a small amount of blood in your stool.  · You may pass moderate amounts of gas and have mild abdominal cramping or bloating. This is caused by the gas used to inflate your colon during the exam.  · Ask when your test results will be ready and how you will get your results. Make sure you get your test results.  Document Released: 11/06/2000 Document Revised: 08/30/2013 Document Reviewed: 07/17/2013  ExitCare® Patient Information ©2015 [employer]. This information is not intended to replace advice given to you by your health care provider. Make sure you discuss any questions you have with your health care provider.

## 2015-04-08 NOTE — Telephone Encounter (Signed)
States her legs hurt and she was told it could be her Letrozole causing it. Also states PT from [place] said she needs to have an EKG dure to her heart rate always being up and wants to know if you will order it

## 2015-04-08 NOTE — Telephone Encounter (Signed)
Get EKG and see me next week continue the letrozole. Order entered for ekg and sched fu and pt informed scheduler will call her with appts

## 2015-04-08 NOTE — Progress Notes (Signed)
Patient ID: Cassidy Norris, female   DOB: Jun 16, 1951, 64 y.o.   MRN: [id number]  Chief Complaint  Patient presents with  . Follow-up    breast cancer    HPI Cassidy Norris is a 64 y.o. female. here today following up from breast cancer. Bilateral mastectomy's done on 01/09/14. Patient states she is doing well. She states that she feels tight around her mastectomy sites. She has had a recent increase in pulse rate since starting exercising at the gym. She is getting set up for an EKG per Dr Metro Kung office to assess the increase heart rate. She does have some complaints of leg pain.  HPI  Past Medical History  Diagnosis Date  . Personal history of tobacco use, presenting hazards to health   . Multiple sclerosis   . Hypertension   . Diffuse cystic mastopathy   . Family history of malignant neoplasm of gastrointestinal tract 2012  . Obesity, unspecified   . Special screening for malignant neoplasms, colon   . Cancer 2014    right breast - Invasive Mammary Carcinoma  . Cancer 2014    left breast - Invasive lobular carcinoma and DCIS   . Fractured elbow 08/28/14    Past Surgical History  Procedure Laterality Date  . Wisdom tooth extraction  2006  . Polypectomy  1989  . Dilation and curettage of uterus  1983  . Colonoscopy  2010    Dr. Jamal Collin  . Tubal ligation    . Portacath placement  2015  . Spine surgery  09/14/14  . Breast biopsy Right 1973,2001  . Breast biopsy Right 11-07-13    INVASIVE MAMMARY CARCINOMA , ER/PR positive Her2 negative  . Breast biopsy Left 11-27-13    invasive lobular and DCIS  . Breast surgery Bilateral 01/09/14    mastectomy    Family History  Problem Relation Age of Onset  . Cancer Mother     lung  . Cancer Father     colon  . Diabetes Maternal Grandmother     Social History History  Substance Use Topics  . Smoking status: Former Smoker -- 1.00 packs/day for 30 years    Types: Cigarettes    Quit date: 12/11/2013  . Smokeless tobacco:  Never Used  . Alcohol Use: 0.0 oz/week    0 Standard drinks or equivalent per week     Comment: social    No Known Allergies  Current Outpatient Prescriptions  Medication Sig Dispense Refill  . acetaminophen (TYLENOL) 325 MG tablet Take 650 mg by mouth every 6 (six) hours as needed.    . Calcium Carbonate (CALCIUM 600 PO) Take 1 tablet by mouth 2 (two) times daily.    . Cholecalciferol (VITAMIN D PO) Take 1 tablet by mouth daily.    . Cyanocobalamin (VITAMIN B-12) 5000 MCG LOZG Take by mouth daily.    . diphenhydrAMINE (SOMINEX) 25 MG tablet Take 25 mg by mouth at bedtime as needed for sleep.    Marland Kitchen ibuprofen (ADVIL,MOTRIN) 200 MG tablet Take 400 mg by mouth every 6 (six) hours as needed.    Marland Kitchen letrozole (FEMARA) 2.5 MG tablet   2  . Melatonin 3 MG TABS Take 1 tablet by mouth daily.    . Omega-3 Fatty Acids (FISH OIL PO) Take 1 capsule by mouth 2 (two) times daily.    . pramipexole (MIRAPEX) 0.5 MG tablet   2  . traZODone (DESYREL) 50 MG tablet Take 50 mg by mouth at bedtime.    Marland Kitchen  Vitamin D, Cholecalciferol, 400 UNITS CAPS Take by mouth.     No current facility-administered medications for this visit.    Review of Systems Review of Systems  Constitutional: Negative.   Respiratory: Negative.   Cardiovascular: Negative.     Blood pressure 130/74, pulse 110, resp. rate 14, height 5' 4"  (1.626 m), weight 199 lb (90.266 kg).  Physical Exam Physical Exam  Constitutional: She is oriented to person, place, and time. She appears well-developed and well-nourished.  Eyes: Conjunctivae are normal. No scleral icterus.  Neck: Neck supple.  Cardiovascular: Normal rate, regular rhythm and normal heart sounds.   Pulmonary/Chest: Effort normal and breath sounds normal.  Both mastectomy sites are well healed. No evidence of local recurrence.   Abdominal: Soft. Normal appearance. There is no hepatomegaly. There is no tenderness.  Lymphadenopathy:    She has no cervical adenopathy.    She has  no axillary adenopathy.  Neurological: She is alert and oriented to person, place, and time.    Data Reviewed Previous note.   Assessment    Pt is 15 mos post bil mastectomy for bil CA. No signs of recurrence Currently on Letrazole. She is having some leg discomfort and is seeing Dr. Oliva Bustard next week.    Plan    Patient is due for a colonoscopy due to family history and personal history of colon polyps.    This patient is to have an EKQ. If this looks okay and patient gets okay from Dr. Oliva Bustard, patient will call the office to arrange colonoscopy date. Miralax prescription will be sent at that time.  Follow up for breast CA in 6 mos       Gaspar Cola 04/08/2015, 1:40 PM

## 2015-04-10 ENCOUNTER — Other Ambulatory Visit: Payer: Self-pay

## 2015-04-10 ENCOUNTER — Ambulatory Visit
Admission: RE | Admit: 2015-04-10 | Discharge: 2015-04-10 | Disposition: A | Discharge status: Home / Self Care | Payer: BLUE CROSS/BLUE SHIELD | Source: Ambulatory Visit | Attending: Oncology | Admitting: Oncology

## 2015-04-17 ENCOUNTER — Inpatient Hospital Stay: Payer: BLUE CROSS/BLUE SHIELD | Attending: Oncology | Admitting: Oncology

## 2015-04-17 VITALS — BP 133/77 | HR 139 | Temp 97.1°F | Wt 198.0 lb

## 2015-04-17 DIAGNOSIS — Z79899 Other long term (current) drug therapy: Secondary | ICD-10-CM | POA: Insufficient documentation

## 2015-04-17 DIAGNOSIS — Z9013 Acquired absence of bilateral breasts and nipples: Secondary | ICD-10-CM

## 2015-04-17 DIAGNOSIS — Z79811 Long term (current) use of aromatase inhibitors: Secondary | ICD-10-CM | POA: Insufficient documentation

## 2015-04-17 DIAGNOSIS — G35 Multiple sclerosis: Secondary | ICD-10-CM | POA: Insufficient documentation

## 2015-04-17 DIAGNOSIS — I10 Essential (primary) hypertension: Secondary | ICD-10-CM | POA: Insufficient documentation

## 2015-04-17 DIAGNOSIS — Z170 Estrogen receptor positive status [ER+]: Secondary | ICD-10-CM | POA: Diagnosis not present

## 2015-04-17 DIAGNOSIS — C50911 Malignant neoplasm of unspecified site of right female breast: Secondary | ICD-10-CM | POA: Diagnosis present

## 2015-04-17 DIAGNOSIS — C50912 Malignant neoplasm of unspecified site of left female breast: Secondary | ICD-10-CM | POA: Insufficient documentation

## 2015-04-18 ENCOUNTER — Ambulatory Visit: Payer: BLUE CROSS/BLUE SHIELD | Attending: Otolaryngology

## 2015-04-18 DIAGNOSIS — G4733 Obstructive sleep apnea (adult) (pediatric): Secondary | ICD-10-CM | POA: Insufficient documentation

## 2015-04-18 DIAGNOSIS — F5101 Primary insomnia: Secondary | ICD-10-CM | POA: Insufficient documentation

## 2015-04-18 DIAGNOSIS — G2581 Restless legs syndrome: Secondary | ICD-10-CM | POA: Insufficient documentation

## 2015-04-19 ENCOUNTER — Encounter: Payer: Self-pay | Admitting: Cardiovascular Disease

## 2015-04-19 ENCOUNTER — Ambulatory Visit (INDEPENDENT_AMBULATORY_CARE_PROVIDER_SITE_OTHER): Payer: BLUE CROSS/BLUE SHIELD | Admitting: Cardiovascular Disease

## 2015-04-19 VITALS — BP 110/80 | HR 112 | Ht 65.0 in | Wt 196.8 lb

## 2015-04-19 DIAGNOSIS — R0602 Shortness of breath: Secondary | ICD-10-CM

## 2015-04-19 DIAGNOSIS — R000 Tachycardia, unspecified: Secondary | ICD-10-CM | POA: Diagnosis not present

## 2015-04-19 MED ORDER — CARVEDILOL 3.125 MG PO TABS: 3.1250 mg | ORAL_TABLET | Freq: Two times a day (BID) | ORAL | Status: DC

## 2015-04-19 NOTE — Patient Instructions (Signed)
Medication Instructions:  Your physician has recommended you make the following change in your medication:  START taking coreg 3.125mg  twice a day   Labwork: Your physician recommends that you have lab work today: BMET, CBC, TSH, BNP   Testing/Procedures: Your physician has requested that you have an echocardiogram. Echocardiography is a painless test that uses sound waves to create images of your heart. It provides your doctor with information about the size and shape of your heart and how well your heart's chambers and valves are working. This procedure takes approximately one hour. There are no restrictions for this procedure.    Follow-Up: Your physician recommends that you schedule a follow-up appointment after echo with Dr. Fletcher Anon.    Any Other Special Instructions Will Be Listed Below (If Applicable).

## 2015-04-19 NOTE — Progress Notes (Signed)
Primary care physician: Dr. Jeananne Rama Referring physician: Dr. Jeb Levering  HPI  This is a pleasant 64 year old female who was referred for evaluation of tachycardia. She has no previous cardiac history. She has history of breast cancer status post bilateral mastectomy followed by chemotherapy with Adriamycin which was completed in September 2015. She also reports history of multiple sclerosis, neuropathy and arthritis. She quit smoking last year. She reports no family history of coronary artery disease or other cardiac problems. She has been attending rehabilitation. She was noted to have sinus tachycardia starting in March. The patient denies any chest pain or palpitations. She did notice worsening exertional dyspnea with no orthopnea or PND. She has trace leg edema. Ejection fraction was reported to be normal before chemotherapy.  No Known Allergies   Current Outpatient Prescriptions on File Prior to Visit  Medication Sig Dispense Refill  . acetaminophen (TYLENOL) 325 MG tablet Take 650 mg by mouth every 6 (six) hours as needed.    . Calcium Carbonate (CALCIUM 600 PO) Take 1 tablet by mouth 2 (two) times daily.    . Cyanocobalamin (VITAMIN B-12) 5000 MCG LOZG Take by mouth daily.    . diphenhydrAMINE (SOMINEX) 25 MG tablet Take 25 mg by mouth at bedtime as needed for sleep.    Marland Kitchen ibuprofen (ADVIL,MOTRIN) 200 MG tablet Take 400 mg by mouth every 6 (six) hours as needed.    . Melatonin 3 MG TABS Take 1 tablet by mouth daily.    . pramipexole (MIRAPEX) 0.5 MG tablet Take 0.5 mg by mouth daily.   2  . traZODone (DESYREL) 50 MG tablet Take 50 mg by mouth at bedtime.     No current facility-administered medications on file prior to visit.     Past Medical History  Diagnosis Date  . Personal history of tobacco use, presenting hazards to health   . Multiple sclerosis   . Hypertension   . Diffuse cystic mastopathy   . Family history of malignant neoplasm of gastrointestinal tract 2012  . Obesity,  unspecified   . Special screening for malignant neoplasms, colon   . Cancer 2014    right breast - Invasive Mammary Carcinoma  . Cancer 2014    left breast - Invasive lobular carcinoma and DCIS   . Fractured elbow 08/28/14     Past Surgical History  Procedure Laterality Date  . Wisdom tooth extraction  2006  . Polypectomy  1989  . Dilation and curettage of uterus  1983  . Colonoscopy  2010    Dr. Jamal Collin  . Tubal ligation    . Portacath placement  2015  . Spine surgery  09/14/14  . Breast biopsy Right 1973,2001  . Breast biopsy Right 11-07-13    INVASIVE MAMMARY CARCINOMA , ER/PR positive Her2 negative  . Breast biopsy Left 11-27-13    invasive lobular and DCIS  . Breast surgery Bilateral 01/09/14    mastectomy     Family History  Problem Relation Age of Onset  . Cancer Mother     lung  . Cancer Father     colon, stomach  . Diabetes Maternal Grandmother   . Rectal cancer Paternal Uncle   . Rectal cancer Paternal Uncle      History   Social History  . Marital Status: Widowed    Spouse Name: N/A  . Number of Children: N/A  . Years of Education: N/A   Occupational History  . Not on file.   Social History Main Topics  . Smoking  status: Former Smoker -- 1.00 packs/day for 30 years    Types: Cigarettes    Quit date: 12/11/2013  . Smokeless tobacco: Never Used  . Alcohol Use: 0.0 oz/week    0 Standard drinks or equivalent per week     Comment: social  . Drug Use: No  . Sexual Activity: No   Other Topics Concern  . Not on file   Social History Narrative     ROS A 10 point review of system was performed. It is negative other than that mentioned in the history of present illness.   PHYSICAL EXAM   BP 110/80 mmHg  Pulse 112  Ht 5' 5"  (1.651 m)  Wt 196 lb 12 oz (89.245 kg)  BMI 32.74 kg/m2 Constitutional: She is oriented to person, place, and time. She appears well-developed and well-nourished. No distress.  HENT: No nasal discharge.  Head:  Normocephalic and atraumatic.  Eyes: Pupils are equal and round. No discharge.  Neck: Normal range of motion. Neck supple. No JVD present. No thyromegaly present.  Cardiovascular: Tachycardic, regular rhythm, normal heart sounds. Exam reveals no gallop and no friction rub. No murmur heard.  Pulmonary/Chest: Effort normal and breath sounds normal. No stridor. No respiratory distress. She has no wheezes. She has no rales. She exhibits no tenderness.  Abdominal: Soft. Bowel sounds are normal. She exhibits no distension. There is no tenderness. There is no rebound and no guarding.  Musculoskeletal: Normal range of motion. She exhibits no edema and no tenderness.  Neurological: She is alert and oriented to person, place, and time. Coordination normal.  Skin: Skin is warm and dry. No rash noted. She is not diaphoretic. No erythema. No pallor.  Psychiatric: She has a normal mood and affect. Her behavior is normal. Judgment and thought content normal.     JZP:HXTAV  Tachycardia  -  Nonspecific T-abnormality.   ABNORMAL     ASSESSMENT AND PLAN

## 2015-04-19 NOTE — Assessment & Plan Note (Signed)
The patient has sinus tachycardia with associated exertional dyspnea. I think we have to exclude chemotherapy-induced cardiomyopathy. Thus, I requested an echocardiogram for evaluation. I also requested routine labs today including thyroid function and BNP. I elected to start the patient on small dose carvedilol 3.125 mg twice daily. she has no convincing symptoms of pulmonary embolism but this can be evaluated if there is evidence of pulmonary hypertension or right ventricular involvement. The onset of symptoms have been gradual and not sudden.

## 2015-04-22 ENCOUNTER — Encounter: Payer: Self-pay | Admitting: Oncology

## 2015-04-22 NOTE — Progress Notes (Signed)
[facility] @ [facility] [phone]  Fax:([phone]) [place]: 1950/12/13  MR#: [id number]  VOH#:[id number]  Patient Care Team: Guadalupe Maple, MD as PCP - General (Family Medicine) Seeplaputhur Robinette Haines, MD (General Surgery) Guadalupe Maple, MD (Family Medicine) Forest Gleason, MD (Oncology)  CHIEF COMPLAINT:  Chief Complaint  Patient presents with  . Follow-up    having leg pain and right neck pain recently and thinks is related to letrozole. also is having elevated heart rate at rest. had ekg performed the other day.     Oncology History   1. Bilateral carcinoma of breast, February, 2015. Right breast status post radical mastectomy. T1 C. N0M0 (isolated tumor cells in one lymph node) multifocal invasive cancer. Stage IC, Left breast, Status post radical mastectomy, T2, N1, M0 tumor. Stage II, RIght breast. Both tumors are estrogen receptor positive.  Progesterone receptor positive.  HER-2 receptor negative by FISH. Negative for BRCA mutation.  2. Started on Adriamycin and Cytoxan on March 01, 2014. Last cycle of Cytoxan and Adriamycin on May 01, 2014. Patient has finished last chemotherapy on September 9. (12 th dose was omitted because of neuropathy) 3.  Taking tamoxifen.  Patient had a poor tolerance to letrozole.(October, 2015) 4.  Patient is taking letrozole since November of 2015 5.  May, 2016 Letrozole has been put on hold because of bony pains     Breast cancer bilateral, multifocal ER/PR pos, Her 2 neg,.   11/29/2013 Initial Diagnosis Breast cancer bilateral, multifocal ER/PR pos, Her 2 neg,.    No flowsheet data found.  INTERVAL HISTORY:  64 year old lady keeps on complaining of low back pain and pain radiating down to lower extremity.  Joint pains. She continues to believe the pain is secondary to letrozole therapy so was decided to put on hold.  Patient was also noticed to have tachycardia No chest pain.  Shortness of breath on  exertion.   REVIEW OF SYSTEMS:   Gen. status: Patient is somewhat apprehensive and anxious Cardiac: Tachycardia has been noticed on several occasions and an echocardiogram was done Lungs: Shortness of breath on exertion Musculoskeletal system: Low back pain and joint pains knee pain Gastro intestinal system: No heartburn.  No nausea or vomiting.  No abdominal pain.  No diarrhea.  No constipation.  No rectal bleeding. Neurological system: No dizziness.  No tingling.  His was.  no tingling numbness.  no focal weakness or any focal signs. Skin: No evidence of ecchymosis or rash.. Lower extremity no edema. Genitourinary system: No dysuria.  No hematuria.  No frequency.  No flank pain.  As per HPI. Otherwise, a complete review of systems is negatve.  PAST MEDICAL HISTORY: Past Medical History  Diagnosis Date  . Personal history of tobacco use, presenting hazards to health   . Multiple sclerosis   . Hypertension   . Diffuse cystic mastopathy   . Family history of malignant neoplasm of gastrointestinal tract 2012  . Obesity, unspecified   . Special screening for malignant neoplasms, colon   . Cancer 2014    right breast - Invasive Mammary Carcinoma  . Cancer 2014    left breast - Invasive lobular carcinoma and DCIS   . Fractured elbow 08/28/14    PAST SURGICAL HISTORY: Past Surgical History  Procedure Laterality Date  . Wisdom tooth extraction  2006  . Polypectomy  1989  . Dilation and curettage of uterus  1983  . Colonoscopy  2010    Dr.  Sankar  . Tubal ligation    . Portacath placement  2015  . Spine surgery  09/14/14  . Breast biopsy Right 1973,2001  . Breast biopsy Right 11-07-13    INVASIVE MAMMARY CARCINOMA , ER/PR positive Her2 negative  . Breast biopsy Left 11-27-13    invasive lobular and DCIS  . Breast surgery Bilateral 01/09/14    mastectomy    FAMILY HISTORY Family History  Problem Relation Age of Onset  . Cancer Mother     lung  . Cancer Father      colon, stomach  . Diabetes Maternal Grandmother   . Rectal cancer Paternal Uncle   . Rectal cancer Paternal Uncle         ADVANCED DIRECTIVES: Patient does not have any advanced healthcare directive. Information has been given.   HEALTH MAINTENANCE: History  Substance Use Topics  . Smoking status: Former Smoker -- 1.00 packs/day for 30 years    Types: Cigarettes    Quit date: 12/11/2013  . Smokeless tobacco: Never Used  . Alcohol Use: 0.0 oz/week    0 Standard drinks or equivalent per week     Comment: social       No Known Allergies  Current Outpatient Prescriptions  Medication Sig Dispense Refill  . acetaminophen (TYLENOL) 325 MG tablet Take 650 mg by mouth every 6 (six) hours as needed.    . Calcium Carbonate (CALCIUM 600 PO) Take 1 tablet by mouth 2 (two) times daily.    . Cyanocobalamin (VITAMIN B-12) 5000 MCG LOZG Take by mouth daily.    . diphenhydrAMINE (SOMINEX) 25 MG tablet Take 25 mg by mouth at bedtime as needed for sleep.    Marland Kitchen ibuprofen (ADVIL,MOTRIN) 200 MG tablet Take 400 mg by mouth every 6 (six) hours as needed.    . Melatonin 3 MG TABS Take 1 tablet by mouth daily.    . pramipexole (MIRAPEX) 0.5 MG tablet Take 0.5 mg by mouth daily.   2  . traZODone (DESYREL) 50 MG tablet Take 50 mg by mouth at bedtime.    . carvedilol (COREG) 3.125 MG tablet Take 1 tablet (3.125 mg total) by mouth 2 (two) times daily with a meal. 60 tablet 5  . cholecalciferol (VITAMIN D) 1000 UNITS tablet Take 2,000 Units by mouth daily.    . meloxicam (MOBIC) 15 MG tablet Take 15 mg by mouth daily.     No current facility-administered medications for this visit.    OBJECTIVE:  Filed Vitals:   04/17/15 1409  BP: 133/77  Pulse: 139  Temp: 97.1 F (36.2 C)     Body mass index is 33.97 kg/(m^2).    ECOG FS:1 - Symptomatic but completely ambulatory  PHYSICAL EXAM: Goal status: Performance status is good.  Patient has not lost significant weight HEENT: No evidence of  stomatitis. Sclera and conjunctivae :: No jaundice.   pale looking. Lungs: Air  entry equal on both sides.  No rhonchi.  No rales.. Cardiac: Tachycardia.  No murmur.  No rub.   Lymphatic system: Cervical, axillary, inguinal, lymph nodes not palpable GI: Abdomen is soft.  No ascites.  Liver spleen not palpable.  No tenderness.  Bowel sounds are within normal limit Lower extremity: No edema Neurological system: Higher functions, cranial nerves intact no evidence of peripheral neuropathy. Skin: No rash.  No ecchymosis.. Bilateral chest wall area and no evidence of recurrent disease Axillary lymph nodes not palpable Status post bilateral mastectomy  LAB RESULTS:  No visits with results within  3 Day(s) from this visit. Latest known visit with results is:  Hospital Outpatient Visit on 03/12/2015  Component Date Value Ref Range Status  . Creatinine, Ser 03/12/2015 0.71  0.50 - 1.10 mg/dL Final  . GFR calc non Af Amer 03/12/2015 >90  >90 mL/min Final  . GFR calc Af Amer 03/12/2015 >90  >90 mL/min Final   Comment: (NOTE) The eGFR has been calculated using the CKD EPI equation. This calculation has not been validated in all clinical situations. eGFR's persistently <90 mL/min signify possible Chronic Kidney Disease.     Lab Results  Component Value Date   LABCA2 24.9 11/21/2013     STUDIES: No results found.  ASSESSMENT: 64 year old lady with a history of bilateral carcinoma progressed on letrozole therapy Presents with bony pains on letrozole has been put on hold Tachycardia had echocardiogram patient has been referred to cardiology for assessment  MEDICAL DECISION MAKING:  All lab data has been reviewed. Refer patient to cardiologist Hold letrozole and consider tamoxifen or Aromasin  Patient expressed understanding and was in agreement with this plan. She also understands that She can call clinic at any time with any questions, concerns, or complaints.    Breast cancer  bilateral, multifocal ER/PR pos, Her 2 neg,.   Staging form: Breast, AJCC 7th Edition     Clinical: Stage IIB (T2, N1, M0) - Signed by Forest Gleason, MD on 04/22/2015   Forest Gleason, MD   04/22/2015 10:59 AM

## 2015-04-23 ENCOUNTER — Other Ambulatory Visit: Payer: Self-pay | Admitting: Neurosurgery

## 2015-04-23 DIAGNOSIS — M5126 Other intervertebral disc displacement, lumbar region: Secondary | ICD-10-CM

## 2015-04-24 NOTE — Telephone Encounter (Signed)
Entered in error

## 2015-04-25 LAB — BASIC METABOLIC PANEL
BUN/Creatinine Ratio: 26 (ref 11–26)
BUN: 20 mg/dL (ref 8–27)
CO2: 24 mmol/L (ref 18–29)
Calcium: 9.9 mg/dL (ref 8.7–10.3)
Chloride: 101 mmol/L (ref 97–108)
Creatinine, Ser: 0.76 mg/dL (ref 0.57–1.00)
GFR, EST AFRICAN AMERICAN: 97 mL/min/{1.73_m2} (ref >59)
GFR, EST NON AFRICAN AMERICAN: 84 mL/min/{1.73_m2} (ref >59)
Glucose: 94 mg/dL (ref 65–99)
Potassium: 4.6 mmol/L (ref 3.5–5.2)
SODIUM: 140 mmol/L (ref 134–144)

## 2015-04-25 LAB — CBC
HEMATOCRIT: 43.9 % (ref 34.0–46.6)
Hemoglobin: 14.5 g/dL (ref 11.1–15.9)
MCH: 31.1 pg (ref 26.6–33.0)
MCHC: 33 g/dL (ref 31.5–35.7)
MCV: 94 fL (ref 79–97)
Platelets: 247 10*3/uL (ref 150–379)
RBC: 4.66 x10E6/uL (ref 3.77–5.28)
RDW: 14.2 % (ref 12.3–15.4)
WBC: 8.7 10*3/uL (ref 3.4–10.8)

## 2015-04-25 LAB — TSH: TSH: 1.07 u[IU]/mL (ref 0.450–4.500)

## 2015-04-25 LAB — BRAIN NATRIURETIC PEPTIDE: BNP: 4.9 pg/mL (ref 0.0–100.0)

## 2015-04-29 ENCOUNTER — Ambulatory Visit
Admission: RE | Admit: 2015-04-29 | Discharge: 2015-04-29 | Disposition: A | Discharge status: Home / Self Care | Payer: BLUE CROSS/BLUE SHIELD | Source: Ambulatory Visit | Attending: Neurosurgery | Admitting: Neurosurgery

## 2015-04-29 ENCOUNTER — Ambulatory Visit: Admission: RE | Admit: 2015-04-29 | Payer: BLUE CROSS/BLUE SHIELD | Source: Ambulatory Visit

## 2015-04-29 ENCOUNTER — Inpatient Hospital Stay: Admission: RE | Admit: 2015-04-29 | Payer: BLUE CROSS/BLUE SHIELD | Source: Ambulatory Visit

## 2015-04-29 DIAGNOSIS — M5126 Other intervertebral disc displacement, lumbar region: Secondary | ICD-10-CM | POA: Diagnosis not present

## 2015-04-29 DIAGNOSIS — M4806 Spinal stenosis, lumbar region: Secondary | ICD-10-CM | POA: Insufficient documentation

## 2015-04-29 MED ORDER — GADOBENATE DIMEGLUMINE 529 MG/ML IV SOLN
18.0000 mL | Freq: Once | INTRAVENOUS | Status: AC | PRN
  Administered 2015-04-29: 18 mL via INTRAVENOUS

## 2015-05-02 ENCOUNTER — Ambulatory Visit (INDEPENDENT_AMBULATORY_CARE_PROVIDER_SITE_OTHER): Payer: BLUE CROSS/BLUE SHIELD

## 2015-05-02 ENCOUNTER — Other Ambulatory Visit: Payer: Self-pay

## 2015-05-02 DIAGNOSIS — R0602 Shortness of breath: Secondary | ICD-10-CM | POA: Diagnosis not present

## 2015-05-02 DIAGNOSIS — R000 Tachycardia, unspecified: Secondary | ICD-10-CM

## 2015-05-16 ENCOUNTER — Ambulatory Visit (INDEPENDENT_AMBULATORY_CARE_PROVIDER_SITE_OTHER): Payer: BLUE CROSS/BLUE SHIELD | Admitting: Cardiovascular Disease

## 2015-05-16 ENCOUNTER — Encounter: Payer: Self-pay | Admitting: Cardiovascular Disease

## 2015-05-16 ENCOUNTER — Telehealth: Payer: Self-pay | Admitting: *Deleted

## 2015-05-16 ENCOUNTER — Encounter: Payer: Self-pay | Admitting: General Surgery

## 2015-05-16 VITALS — BP 112/78 | HR 80 | Ht 65.0 in | Wt 202.8 lb

## 2015-05-16 DIAGNOSIS — R000 Tachycardia, unspecified: Secondary | ICD-10-CM | POA: Diagnosis not present

## 2015-05-16 MED ORDER — POLYETHYLENE GLYCOL 3350 17 GM/SCOOP PO POWD: ORAL | Status: DC

## 2015-05-16 NOTE — Patient Instructions (Signed)
Medication Instructions: Continue same medications.   Labwork: None.   Procedures/Testing: None.   Follow-Up: 6 months with Dr. Fletcher Anon  Any Additional Special Instructions Will Be Listed Below (If Applicable). You can resume rehab You are cleared for colonoscopy

## 2015-05-16 NOTE — Telephone Encounter (Signed)
Message left on home and cell numbers for patient to call the office.   We need to go ahead and arrange colonoscopy at patient's convenience. We have received clearance from Dr. Fletcher Anon that patient is at low risk.   Patient will need to discontinue fish oil one week prior.   Miralax prescription will need to be sent to patient's pharmacy once date arranged.

## 2015-05-16 NOTE — Telephone Encounter (Signed)
Patient has been scheduled for a colonoscopy on 06-11-15 at [facility].   Miralax prescription has been sent in to patient's pharmacy.   Patient reports she is no longer on fish oil. This has been updated on medication list.

## 2015-05-16 NOTE — Assessment & Plan Note (Signed)
The patient might have inappropriate sinus tachycardia. Workup so far did not show any evidence of cardiomyopathy, hyperthyroidism or other reversible causes. She seems to be overall asymptomatic and heart rate improved with small dose carvedilol. I do not recommend further workup at the present time. She has no symptoms suggestive of angina. She can resume exercise and rehabilitation from a cardiac standpoint. She is also at low risk for colonoscopy from a cardiac standpoint.

## 2015-05-16 NOTE — Progress Notes (Signed)
Primary care physician: Dr. Jeananne Rama Referring physician: Dr. Jeb Levering  HPI  This is a pleasant 64 year old female who is here today for a follow-up visit regarding sinus tachycardia. She has no previous cardiac history. She has history of breast cancer status post bilateral mastectomy followed by chemotherapy with Adriamycin which was completed in September 2015. She also reports history of multiple sclerosis, neuropathy and arthritis. She quit smoking last year. She reports no family history of coronary artery disease or other cardiac problems. She has been attending rehabilitation. She was noted to have sinus tachycardia starting in March. The patient denies any chest pain or palpitations. She did notice worsening exertional dyspnea with no orthopnea or PND. She has trace leg edema.  Labs showed normal CBC, TSH, BMP and BNP. There was a concern about possible chemotherapy-induced cardiomyopathy. However, echocardiogram showed normal LV systolic function with grade 1 diastolic dysfunction and no significant pulmonary hypertension. The patient was started on small dose carvedilol was overall improvement in heart rate.   No Known Allergies   Current Outpatient Prescriptions on File Prior to Visit  Medication Sig Dispense Refill  . acetaminophen (TYLENOL) 325 MG tablet Take 650 mg by mouth every 6 (six) hours as needed.    . Calcium Carbonate (CALCIUM 600 PO) Take 1 tablet by mouth 2 (two) times daily.    . carvedilol (COREG) 3.125 MG tablet Take 1 tablet (3.125 mg total) by mouth 2 (two) times daily with a meal. 60 tablet 5  . cholecalciferol (VITAMIN D) 1000 UNITS tablet Take 2,000 Units by mouth daily.    . Cyanocobalamin (VITAMIN B-12) 5000 MCG LOZG Take by mouth daily.    . diphenhydrAMINE (SOMINEX) 25 MG tablet Take 25 mg by mouth at bedtime as needed for sleep.    Marland Kitchen ibuprofen (ADVIL,MOTRIN) 200 MG tablet Take 400 mg by mouth every 6 (six) hours as needed.    . Melatonin 3 MG TABS Take 1  tablet by mouth daily.    . meloxicam (MOBIC) 15 MG tablet Take 15 mg by mouth daily.    . pramipexole (MIRAPEX) 0.5 MG tablet Take 0.5 mg by mouth daily.   2  . traZODone (DESYREL) 50 MG tablet Take 50 mg by mouth at bedtime.     No current facility-administered medications on file prior to visit.     Past Medical History  Diagnosis Date  . Personal history of tobacco use, presenting hazards to health   . Multiple sclerosis   . Hypertension   . Diffuse cystic mastopathy   . Family history of malignant neoplasm of gastrointestinal tract 2012  . Obesity, unspecified   . Special screening for malignant neoplasms, colon   . Cancer 2014    right breast - Invasive Mammary Carcinoma  . Cancer 2014    left breast - Invasive lobular carcinoma and DCIS   . Fractured elbow 08/28/14     Past Surgical History  Procedure Laterality Date  . Wisdom tooth extraction  2006  . Polypectomy  1989  . Dilation and curettage of uterus  1983  . Colonoscopy  2010    Dr. Jamal Collin  . Tubal ligation    . Portacath placement  2015  . Spine surgery  09/14/14  . Breast biopsy Right 1973,2001  . Breast biopsy Right 11-07-13    INVASIVE MAMMARY CARCINOMA , ER/PR positive Her2 negative  . Breast biopsy Left 11-27-13    invasive lobular and DCIS  . Breast surgery Bilateral 01/09/14    mastectomy  Family History  Problem Relation Age of Onset  . Cancer Mother     lung  . Cancer Father     colon, stomach  . Diabetes Maternal Grandmother   . Rectal cancer Paternal Uncle   . Rectal cancer Paternal Uncle      History   Social History  . Marital Status: Widowed    Spouse Name: N/A  . Number of Children: N/A  . Years of Education: N/A   Occupational History  . Not on file.   Social History Main Topics  . Smoking status: Former Smoker -- 1.00 packs/day for 30 years    Types: Cigarettes    Quit date: 12/11/2013  . Smokeless tobacco: Never Used  . Alcohol Use: No     Comment: social  .  Drug Use: No  . Sexual Activity: No   Other Topics Concern  . Not on file   Social History Narrative     ROS A 10 point review of system was performed. It is negative other than that mentioned in the history of present illness.   PHYSICAL EXAM   BP 112/78 mmHg  Pulse 80  Ht 5' 5"  (1.651 m)  Wt 202 lb 12 oz (91.967 kg)  BMI 33.74 kg/m2 Constitutional: She is oriented to person, place, and time. She appears well-developed and well-nourished. No distress.  HENT: No nasal discharge.  Head: Normocephalic and atraumatic.  Eyes: Pupils are equal and round. No discharge.  Neck: Normal range of motion. Neck supple. No JVD present. No thyromegaly present.  Cardiovascular: Normal rate, regular rhythm, normal heart sounds. Exam reveals no gallop and no friction rub. No murmur heard.  Pulmonary/Chest: Effort normal and breath sounds normal. No stridor. No respiratory distress. She has no wheezes. She has no rales. She exhibits no tenderness.  Abdominal: Soft. Bowel sounds are normal. She exhibits no distension. There is no tenderness. There is no rebound and no guarding.  Musculoskeletal: Normal range of motion. She exhibits no edema and no tenderness.  Neurological: She is alert and oriented to person, place, and time. Coordination normal.  Skin: Skin is warm and dry. No rash noted. She is not diaphoretic. No erythema. No pallor.  Psychiatric: She has a normal mood and affect. Her behavior is normal. Judgment and thought content normal.     BSW:HQPRF  Rhythm  WITHIN NORMAL LIMITS    ASSESSMENT AND PLAN

## 2015-05-16 NOTE — Telephone Encounter (Signed)
-----   Message from Christene Lye, MD sent at 05/16/2015  9:14 AM EDT -----   ----- Message -----    From: Wellington Hampshire, MD    Sent: 05/16/2015   8:56 AM      To: Christene Lye, MD

## 2015-05-16 NOTE — Progress Notes (Signed)
Can schedule colonoscopy. Please contact pt

## 2015-05-29 ENCOUNTER — Inpatient Hospital Stay: Payer: BLUE CROSS/BLUE SHIELD | Attending: Oncology

## 2015-05-29 ENCOUNTER — Telehealth: Payer: Self-pay | Admitting: *Deleted

## 2015-05-29 ENCOUNTER — Encounter: Payer: Self-pay | Admitting: Oncology

## 2015-05-29 ENCOUNTER — Inpatient Hospital Stay (HOSPITAL_BASED_OUTPATIENT_CLINIC_OR_DEPARTMENT_OTHER): Payer: BLUE CROSS/BLUE SHIELD | Admitting: Oncology

## 2015-05-29 VITALS — BP 123/82 | HR 82 | Temp 97.2°F | Wt 206.6 lb

## 2015-05-29 DIAGNOSIS — G35 Multiple sclerosis: Secondary | ICD-10-CM | POA: Insufficient documentation

## 2015-05-29 DIAGNOSIS — Z9013 Acquired absence of bilateral breasts and nipples: Secondary | ICD-10-CM | POA: Diagnosis not present

## 2015-05-29 DIAGNOSIS — Z87891 Personal history of nicotine dependence: Secondary | ICD-10-CM | POA: Diagnosis not present

## 2015-05-29 DIAGNOSIS — C50912 Malignant neoplasm of unspecified site of left female breast: Secondary | ICD-10-CM | POA: Diagnosis not present

## 2015-05-29 DIAGNOSIS — Z170 Estrogen receptor positive status [ER+]: Secondary | ICD-10-CM

## 2015-05-29 DIAGNOSIS — Z79811 Long term (current) use of aromatase inhibitors: Secondary | ICD-10-CM | POA: Diagnosis not present

## 2015-05-29 DIAGNOSIS — I10 Essential (primary) hypertension: Secondary | ICD-10-CM | POA: Insufficient documentation

## 2015-05-29 DIAGNOSIS — C50911 Malignant neoplasm of unspecified site of right female breast: Secondary | ICD-10-CM

## 2015-05-29 DIAGNOSIS — C50919 Malignant neoplasm of unspecified site of unspecified female breast: Secondary | ICD-10-CM

## 2015-05-29 LAB — CBC WITH DIFFERENTIAL/PLATELET
BASOS ABS: 0.1 10*3/uL (ref 0–0.1)
BASOS PCT: 1 %
Eosinophils Absolute: 0.3 10*3/uL (ref 0–0.7)
Eosinophils Relative: 3 %
HCT: 43.3 % (ref 35.0–47.0)
HEMOGLOBIN: 14.3 g/dL (ref 12.0–16.0)
Lymphocytes Relative: 23 %
Lymphs Abs: 2.1 10*3/uL (ref 1.0–3.6)
MCH: 31.1 pg (ref 26.0–34.0)
MCHC: 33 g/dL (ref 32.0–36.0)
MCV: 94.1 fL (ref 80.0–100.0)
MONOS PCT: 9 %
Monocytes Absolute: 0.8 10*3/uL (ref 0.2–0.9)
NEUTROS ABS: 5.8 10*3/uL (ref 1.4–6.5)
NEUTROS PCT: 64 %
Platelets: 224 10*3/uL (ref 150–440)
RBC: 4.6 MIL/uL (ref 3.80–5.20)
RDW: 13.7 % (ref 11.5–14.5)
WBC: 9.1 10*3/uL (ref 3.6–11.0)

## 2015-05-29 LAB — COMPREHENSIVE METABOLIC PANEL
ALT: 28 U/L (ref 14–54)
AST: 30 U/L (ref 15–41)
Albumin: 4.1 g/dL (ref 3.5–5.0)
Alkaline Phosphatase: 70 U/L (ref 38–126)
Anion gap: 9 (ref 5–15)
BILIRUBIN TOTAL: 0.7 mg/dL (ref 0.3–1.2)
BUN: 20 mg/dL (ref 6–20)
CHLORIDE: 102 mmol/L (ref 101–111)
CO2: 26 mmol/L (ref 22–32)
Calcium: 9 mg/dL (ref 8.9–10.3)
Creatinine, Ser: 0.72 mg/dL (ref 0.44–1.00)
GFR calc non Af Amer: >60 mL/min (ref >60)
Glucose, Bld: 136 mg/dL — ABNORMAL HIGH (ref 65–99)
Potassium: 3.9 mmol/L (ref 3.5–5.1)
Sodium: 137 mmol/L (ref 135–145)
Total Protein: 7.1 g/dL (ref 6.5–8.1)

## 2015-05-29 MED ORDER — EXEMESTANE 25 MG PO TABS
25.0000 mg | ORAL_TABLET | Freq: Every day | ORAL | Status: DC
Start: 2015-05-29 — End: 2015-11-20

## 2015-05-29 NOTE — Telephone Encounter (Signed)
Pt called office this morning stating that Dr. Jamal Collin was to do her colonoscopy on July 19,2016 and she wanted to know if she needed to stop her meloxicam. I checked with Dr Jamal Collin and he recommended that she stop taking this medication 3 days prior to her procedure. I informed pt and she verbalized her understanding.

## 2015-05-29 NOTE — Progress Notes (Signed)
Patient does have living will.  Former smoker. 

## 2015-05-30 ENCOUNTER — Other Ambulatory Visit: Payer: Self-pay | Admitting: *Deleted

## 2015-05-30 ENCOUNTER — Telehealth: Payer: Self-pay | Admitting: *Deleted

## 2015-05-30 DIAGNOSIS — R000 Tachycardia, unspecified: Secondary | ICD-10-CM

## 2015-05-30 LAB — VITAMIN D 25 HYDROXY (VIT D DEFICIENCY, FRACTURES): VIT D 25 HYDROXY: 41 ng/mL (ref 30.0–100.0)

## 2015-05-30 MED ORDER — CARVEDILOL 3.125 MG PO TABS: 3.1250 mg | ORAL_TABLET | Freq: Two times a day (BID) | ORAL | Status: DC

## 2015-05-30 NOTE — Telephone Encounter (Signed)
  1. Which medications need to be refilled? Carvedol 3.125mg   2. Which pharmacy is medication to be sent to? [facility]  3. Do they need a 30 day or 90 day supply? 90 day supply  4. Would they like a call back once the medication has been sent to the pharmacy? No

## 2015-05-30 NOTE — Telephone Encounter (Signed)
Rx sent to local pharmacy for Carvedilol.

## 2015-05-31 ENCOUNTER — Telehealth: Payer: Self-pay | Admitting: *Deleted

## 2015-05-31 NOTE — Telephone Encounter (Signed)
Patient is aware tamin D level is 41, which is okay but I would like it to be above 50. I recommend increasing D3 to 2000 units daily and recheck in November prior to follow up.

## 2015-05-31 NOTE — Telephone Encounter (Signed)
-----   Message from Pieter Partridge, DO sent at 05/30/2015  6:41 AM EDT ----- Vitamin D level is 41, which is okay but I would like it to be above 50.  I recommend increasing D3 to 2000 units daily and recheck in November prior to follow up.

## 2015-06-02 ENCOUNTER — Encounter: Payer: Self-pay | Admitting: Oncology

## 2015-06-02 NOTE — Progress Notes (Signed)
[place] @ [facility] [phone]  Fax:([phone]) [place]: 07-16-1951  MR#: [id number]  GUR#:[id number]  Patient Care Team: Guadalupe Maple, MD as PCP - General (Family Medicine) Seeplaputhur Robinette Haines, MD (General Surgery) Guadalupe Maple, MD (Family Medicine) Forest Gleason, MD (Oncology)  CHIEF COMPLAINT:  Chief Complaint  Patient presents with  . Follow-up    Oncology History   1. Bilateral carcinoma of breast, February, 2015. Right breast status post radical mastectomy. T1 C. N0M0 (isolated tumor cells in one lymph node) multifocal invasive cancer. Stage IC, Left breast, Status post radical mastectomy, T2, N1, M0 tumor. Stage II, RIght breast. Both tumors are estrogen receptor positive.  Progesterone receptor positive.  HER-2 receptor negative by FISH. Negative for BRCA mutation.  2. Started on Adriamycin and Cytoxan on March 01, 2014. Last cycle of Cytoxan and Adriamycin on May 01, 2014. Patient has finished last chemotherapy on September 9. (12 th dose was omitted because of neuropathy) 3.  Taking tamoxifen.  Patient had a poor tolerance to letrozole.(October, 2015) 4.  Patient is taking letrozole since November of 2015 5.  May, 2016 Letrozole has been put on hold because of bony pains 6.  Started on Aromasin from July of 2016      Breast cancer bilateral, multifocal ER/PR pos, Her 2 neg,.   11/29/2013 Initial Diagnosis Breast cancer bilateral, multifocal ER/PR pos, Her 2 neg,.    No flowsheet data found.  INTERVAL HISTORY:  64 year old lady keeps on complaining of low back pain and pain radiating down to lower extremity.  Joint pains. She continues to believe the pain is secondary to letrozole therapy so was decided to put on hold.  Patient was also noticed to have tachycardia No chest pain.  Shortness of breath on exertion.  In July, 2016 Patient is off letrozole.  Some of the bony pains have improved knee pain persists.  Here to  discuss possibility of other anti-hormonal therapy.  Patient has not tolerated tamoxifen in the past. Start patient on Aromasin  REVIEW OF SYSTEMS:   Gen. status: Patient is somewhat apprehensive and anxious Cardiac: Tachycardia has been noticed on several occasions and an echocardiogram was done Lungs: Shortness of breath on exertion Musculoskeletal system: Low back pain and joint pains knee pain Gastro intestinal system: No heartburn.  No nausea or vomiting.  No abdominal pain.  No diarrhea.  No constipation.  No rectal bleeding. Neurological system: No dizziness.  No tingling.  His was.  no tingling numbness.  no focal weakness or any focal signs. Skin: No evidence of ecchymosis or rash.. Lower extremity no edema. Genitourinary system: No dysuria.  No hematuria.  No frequency.  No flank pain.  As per HPI. Otherwise, a complete review of systems is negatve.  PAST MEDICAL HISTORY: Past Medical History  Diagnosis Date  . Personal history of tobacco use, presenting hazards to health   . Multiple sclerosis   . Hypertension   . Diffuse cystic mastopathy   . Family history of malignant neoplasm of gastrointestinal tract 2012  . Obesity, unspecified   . Special screening for malignant neoplasms, colon   . Cancer 2014    right breast - Invasive Mammary Carcinoma  . Cancer 2014    left breast - Invasive lobular carcinoma and DCIS   . Fractured elbow 08/28/14    PAST SURGICAL HISTORY: Past Surgical History  Procedure Laterality Date  . Wisdom tooth extraction  2006  . Polypectomy  1989  .  Dilation and curettage of uterus  1983  . Colonoscopy  2010    Dr. Jamal Collin  . Tubal ligation    . Portacath placement  2015  . Spine surgery  09/14/14  . Breast biopsy Right 1973,2001  . Breast biopsy Right 11-07-13    INVASIVE MAMMARY CARCINOMA , ER/PR positive Her2 negative  . Breast biopsy Left 11-27-13    invasive lobular and DCIS  . Breast surgery Bilateral 01/09/14    mastectomy     FAMILY HISTORY Family History  Problem Relation Age of Onset  . Cancer Mother     lung  . Cancer Father     colon, stomach  . Diabetes Maternal Grandmother   . Rectal cancer Paternal Uncle   . Rectal cancer Paternal Uncle         ADVANCED DIRECTIVES: Patient does not have any advanced healthcare directive. Information has been given.   HEALTH MAINTENANCE: History  Substance Use Topics  . Smoking status: Former Smoker -- 1.00 packs/day for 30 years    Types: Cigarettes    Quit date: 12/11/2013  . Smokeless tobacco: Never Used  . Alcohol Use: No     Comment: social       No Known Allergies  Current Outpatient Prescriptions  Medication Sig Dispense Refill  . acetaminophen (TYLENOL) 325 MG tablet Take 650 mg by mouth every 6 (six) hours as needed.    . Calcium Carbonate (CALCIUM 600 PO) Take 1 tablet by mouth 2 (two) times daily.    . cholecalciferol (VITAMIN D) 1000 UNITS tablet Take 2,000 Units by mouth daily.    . Cyanocobalamin (VITAMIN B-12) 5000 MCG LOZG Take by mouth daily.    . diphenhydrAMINE (SOMINEX) 25 MG tablet Take 25 mg by mouth at bedtime as needed for sleep.    Marland Kitchen ibuprofen (ADVIL,MOTRIN) 200 MG tablet Take 400 mg by mouth every 6 (six) hours as needed.    . Melatonin 3 MG TABS Take 1 tablet by mouth daily.    . meloxicam (MOBIC) 15 MG tablet Take 15 mg by mouth daily.    . polyethylene glycol powder (GLYCOLAX/MIRALAX) powder 255 grams one bottle for colonoscopy prep 255 g 0  . pramipexole (MIRAPEX) 0.5 MG tablet Take 0.5 mg by mouth daily.   2  . traZODone (DESYREL) 50 MG tablet Take 50 mg by mouth at bedtime.    . Turmeric Curcumin 500 MG CAPS Take by mouth.    . carvedilol (COREG) 3.125 MG tablet Take 1 tablet (3.125 mg total) by mouth 2 (two) times daily with a meal. 180 tablet 3  . exemestane (AROMASIN) 25 MG tablet Take 1 tablet (25 mg total) by mouth daily after breakfast. 30 tablet 6   No current facility-administered medications for this  visit.    OBJECTIVE:  Filed Vitals:   05/29/15 1502  BP: 123/82  Pulse: 82  Temp: 97.2 F (36.2 C)     Body mass index is 34.38 kg/(m^2).    ECOG FS:1 - Symptomatic but completely ambulatory  PHYSICAL EXAM: Goal status: Performance status is good.  Patient has not lost significant weight HEENT: No evidence of stomatitis. Sclera and conjunctivae :: No jaundice.   pale looking. Lungs: Air  entry equal on both sides.  No rhonchi.  No rales.. Cardiac: Tachycardia.  No murmur.  No rub.   Lymphatic system: Cervical, axillary, inguinal, lymph nodes not palpable GI: Abdomen is soft.  No ascites.  Liver spleen not palpable.  No tenderness.  Bowel  sounds are within normal limit Lower extremity: No edema Neurological system: Higher functions, cranial nerves intact no evidence of peripheral neuropathy. Skin: No rash.  No ecchymosis.. Bilateral chest wall area and no evidence of recurrent disease Axillary lymph nodes not palpable Status post bilateral mastectomy  LAB RESULTS:  Appointment on 05/29/2015  Component Date Value Ref Range Status  . Vit D, 25-Hydroxy 05/29/2015 41.0  30.0 - 100.0 ng/mL Final   Comment: (NOTE) Vitamin D deficiency has been defined by the [employer] practice guideline as a level of serum 25-OH vitamin D less than 20 ng/mL (1,2). The Endocrine Society went on to further define vitamin D insufficiency as a level between 21 and 29 ng/mL (2). 1. IOM (Institute of Medicine). 2010. Dietary reference   intakes for calcium and D. [place]: The   [employer]. 2. Holick MF, Binkley Han[place], Bischoff-Ferrari HA, et al.   Evaluation, treatment, and prevention of vitamin D   deficiency: an Endocrine Society clinical practice   guideline. JCEM. 2011 Jul; 96(7):1911-30. Performed At: [facility] [place], [place] [id number] Lindon Romp MD [id number]   . WBC 05/29/2015 9.1  3.6 - 11.0 K/uL  Final  . RBC 05/29/2015 4.60  3.80 - 5.20 MIL/uL Final  . Hemoglobin 05/29/2015 14.3  12.0 - 16.0 g/dL Final  . HCT 05/29/2015 43.3  35.0 - 47.0 % Final  . MCV 05/29/2015 94.1  80.0 - 100.0 fL Final  . MCH 05/29/2015 31.1  26.0 - 34.0 pg Final  . MCHC 05/29/2015 33.0  32.0 - 36.0 g/dL Final  . RDW 05/29/2015 13.7  11.5 - 14.5 % Final  . Platelets 05/29/2015 224  150 - 440 K/uL Final  . Neutrophils Relative % 05/29/2015 64   Final  . Neutro Abs 05/29/2015 5.8  1.4 - 6.5 K/uL Final  . Lymphocytes Relative 05/29/2015 23   Final  . Lymphs Abs 05/29/2015 2.1  1.0 - 3.6 K/uL Final  . Monocytes Relative 05/29/2015 9   Final  . Monocytes Absolute 05/29/2015 0.8  0.2 - 0.9 K/uL Final  . Eosinophils Relative 05/29/2015 3   Final  . Eosinophils Absolute 05/29/2015 0.3  0 - 0.7 K/uL Final  . Basophils Relative 05/29/2015 1   Final  . Basophils Absolute 05/29/2015 0.1  0 - 0.1 K/uL Final  . Sodium 05/29/2015 137  135 - 145 mmol/L Final  . Potassium 05/29/2015 3.9  3.5 - 5.1 mmol/L Final  . Chloride 05/29/2015 102  101 - 111 mmol/L Final  . CO2 05/29/2015 26  22 - 32 mmol/L Final  . Glucose, Bld 05/29/2015 136* 65 - 99 mg/dL Final  . BUN 05/29/2015 20  6 - 20 mg/dL Final  . Creatinine, Ser 05/29/2015 0.72  0.44 - 1.00 mg/dL Final  . Calcium 05/29/2015 9.0  8.9 - 10.3 mg/dL Final  . Total Protein 05/29/2015 7.1  6.5 - 8.1 g/dL Final  . Albumin 05/29/2015 4.1  3.5 - 5.0 g/dL Final  . AST 05/29/2015 30  15 - 41 U/L Final  . ALT 05/29/2015 28  14 - 54 U/L Final  . Alkaline Phosphatase 05/29/2015 70  38 - 126 U/L Final  . Total Bilirubin 05/29/2015 0.7  0.3 - 1.2 mg/dL Final  . GFR calc non Af Amer 05/29/2015 >60  >60 mL/min Final  . GFR calc Af Amer 05/29/2015 >60  >60 mL/min Final   Comment: (NOTE) The eGFR has been calculated using the  CKD EPI equation. This calculation has not been validated in all clinical situations. eGFR's persistently <60 mL/min signify possible Chronic Kidney Disease.    . Anion gap 05/29/2015 9  5 - 15 Final    Lab Results  Component Value Date   LABCA2 24.9 11/21/2013     STUDIES: No results found.  ASSESSMENT: 64 year old lady with a history of bilateral carcinoma progressed on letrozole therapy shewas seen by cardiologist and was started on coreg and heart rate is under control.  No evidence of cardiomyopathy or ischemic heart disease was found. MEDICAL DECISION MAKING:  All lab data has been reviewed.  Start patient on Aromasin. No evidence of recurrent disease continue follow-up in 6 month or before if there is any complaints  Patient expressed understanding and was in agreement with this plan. She also understands that She can call clinic at any time with any questions, concerns, or complaints.    Breast cancer bilateral, multifocal ER/PR pos, Her 2 neg,.   Staging form: Breast, AJCC 7th      Clinical: Stage IIB (T2, N1, M0) - Signed by Forest Gleason, MD on 04/22/2015   Forest Gleason, MD   06/02/2015 9:11 AM

## 2015-06-05 ENCOUNTER — Other Ambulatory Visit: Payer: Self-pay | Admitting: General Surgery

## 2015-06-05 DIAGNOSIS — Z86010 Personal history of colonic polyps: Secondary | ICD-10-CM

## 2015-06-11 ENCOUNTER — Ambulatory Visit: Payer: BLUE CROSS/BLUE SHIELD | Admitting: Anesthesiology

## 2015-06-11 ENCOUNTER — Encounter: Payer: Self-pay | Admitting: *Deleted

## 2015-06-11 ENCOUNTER — Ambulatory Visit
Admission: RE | Admit: 2015-06-11 | Discharge: 2015-06-11 | Disposition: A | Discharge status: Home / Self Care | Payer: BLUE CROSS/BLUE SHIELD | Source: Ambulatory Visit | Attending: General Surgery | Admitting: General Surgery

## 2015-06-11 ENCOUNTER — Encounter
Admission: RE | Disposition: A | Discharge status: Home / Self Care | Payer: Self-pay | Source: Ambulatory Visit | Attending: General Surgery

## 2015-06-11 DIAGNOSIS — Z09 Encounter for follow-up examination after completed treatment for conditions other than malignant neoplasm: Secondary | ICD-10-CM | POA: Insufficient documentation

## 2015-06-11 DIAGNOSIS — Z801 Family history of malignant neoplasm of trachea, bronchus and lung: Secondary | ICD-10-CM | POA: Diagnosis not present

## 2015-06-11 DIAGNOSIS — Z86010 Personal history of colonic polyps: Secondary | ICD-10-CM | POA: Insufficient documentation

## 2015-06-11 DIAGNOSIS — Z833 Family history of diabetes mellitus: Secondary | ICD-10-CM | POA: Insufficient documentation

## 2015-06-11 DIAGNOSIS — Z853 Personal history of malignant neoplasm of breast: Secondary | ICD-10-CM | POA: Insufficient documentation

## 2015-06-11 DIAGNOSIS — G35 Multiple sclerosis: Secondary | ICD-10-CM | POA: Insufficient documentation

## 2015-06-11 DIAGNOSIS — Z79899 Other long term (current) drug therapy: Secondary | ICD-10-CM | POA: Diagnosis not present

## 2015-06-11 DIAGNOSIS — Z800 Family history of malignant neoplasm of digestive organs: Secondary | ICD-10-CM | POA: Insufficient documentation

## 2015-06-11 DIAGNOSIS — Z6832 Body mass index (BMI) 32.0-32.9, adult: Secondary | ICD-10-CM | POA: Diagnosis not present

## 2015-06-11 DIAGNOSIS — Z87891 Personal history of nicotine dependence: Secondary | ICD-10-CM | POA: Diagnosis not present

## 2015-06-11 DIAGNOSIS — Z9889 Other specified postprocedural states: Secondary | ICD-10-CM | POA: Diagnosis not present

## 2015-06-11 DIAGNOSIS — E669 Obesity, unspecified: Secondary | ICD-10-CM | POA: Insufficient documentation

## 2015-06-11 DIAGNOSIS — Z791 Long term (current) use of non-steroidal anti-inflammatories (NSAID): Secondary | ICD-10-CM | POA: Insufficient documentation

## 2015-06-11 DIAGNOSIS — Z9013 Acquired absence of bilateral breasts and nipples: Secondary | ICD-10-CM | POA: Insufficient documentation

## 2015-06-11 HISTORY — DX: Tachycardia, unspecified: R00.0

## 2015-06-11 HISTORY — DX: Personal history of other endocrine, nutritional and metabolic disease: Z86.39

## 2015-06-11 HISTORY — PX: COLONOSCOPY WITH PROPOFOL: SHX5780

## 2015-06-11 SURGERY — COLONOSCOPY WITH PROPOFOL
Anesthesia: General

## 2015-06-11 MED ORDER — LACTATED RINGERS IV SOLN
INTRAVENOUS | Status: DC
  Administered 2015-06-11: 1000 mL via INTRAVENOUS

## 2015-06-11 MED ORDER — PROPOFOL INFUSION 10 MG/ML OPTIME
INTRAVENOUS | Status: DC | PRN
  Administered 2015-06-11: 140 ug/kg/min via INTRAVENOUS

## 2015-06-11 MED ORDER — PROPOFOL 10 MG/ML IV BOLUS
INTRAVENOUS | Status: DC | PRN
  Administered 2015-06-11 (×2): 20 mg via INTRAVENOUS
  Administered 2015-06-11: 50 mg via INTRAVENOUS
  Administered 2015-06-11: 10 mg via INTRAVENOUS

## 2015-06-11 NOTE — Anesthesia Procedure Notes (Signed)
Date/Time: 06/11/2015 2:05 PM Performed by: Doreen Salvage Pre-anesthesia Checklist: Patient identified, Emergency Drugs available, Suction available and Patient being monitored Patient Re-evaluated:Patient Re-evaluated prior to inductionOxygen Delivery Method: Nasal cannula

## 2015-06-11 NOTE — Transfer of Care (Signed)
Immediate Anesthesia Transfer of Care Note  Patient: Cassidy Norris  Procedure(s) Performed: Procedure(s): COLONOSCOPY WITH PROPOFOL (N/A)  Patient Location: PACU and Endoscopy Unit  Anesthesia Type:General  Level of Consciousness: sedated  Airway & Oxygen Therapy: Patient Spontanous Breathing and Patient connected to nasal cannula oxygen  Post-op Assessment: Report given to RN and Post -op Vital signs reviewed and stable  Post vital signs: Reviewed and stable  Last Vitals:  Filed Vitals:   06/11/15 1440  BP: 98/68  Pulse: 95  Temp: 36.9 C  Resp: 18    Complications: No apparent anesthesia complications

## 2015-06-11 NOTE — Op Note (Signed)
[facility] Gastroenterology Patient Name: Cassidy Norris Procedure Date: 06/11/2015 1:12 PM MRN: [id number] Account #: [id number] Date of Birth: 11-Jun-1951 Admit Type: Outpatient Age: 64 Room: [facility] ENDO ROOM 1 Gender: Female Note Status: Finalized Procedure:         Colonoscopy Indications:       High risk colon cancer surveillance: Personal history of                     colonic polyps Providers:         Orlie Pollen, MD Referring MD:      Guadalupe Maple, MD (Referring MD) Medicines:         General Anesthesia Complications:     No immediate complications. Procedure:         Pre-Anesthesia Assessment:                    - General anesthesia under the supervision of an                     anesthesiologist was determined to be medically necessary                     for this procedure based on review of the patient's                     medical history, medications, and prior anesthesia history.                    After obtaining informed consent, the colonoscope was                     passed under direct vision. Throughout the procedure, the                     patient's blood pressure, pulse, and oxygen saturations                     were monitored continuously. The Colonoscope was                     introduced through the anus and advanced to the the cecum,                     identified by the ileocecal valve. The colonoscopy was                     performed without difficulty. The patient tolerated the                     procedure well. The quality of the bowel preparation was                     good. Findings:      The perianal and digital rectal examinations were normal.      The entire examined colon appeared normal on direct and retroflexion       views. Impression:        - The entire examined colon is normal on direct and                     retroflexion views.                    - No specimens collected. Recommendation:    -  Repeat colonoscopy  in 5 years for surveillance. Procedure Code(s): --- Professional ---                    [phone], Colonoscopy, flexible; diagnostic, including                     collection of specimen(s) by brushing or washing, when                     performed (separate procedure) Diagnosis Code(s): --- Professional ---                    Z86.010, Personal history of colonic polyps CPT copyright 2014 American Medical Association. All rights reserved. The codes documented in this report are preliminary and upon coder review may  be revised to meet current compliance requirements. Orlie Pollen, MD 06/11/2015 2:35:28 PM This report has been signed electronically. Number of Addenda: 0 Note Initiated On: 06/11/2015 1:12 PM Scope Withdrawal Time: 0 hours 6 minutes 48 seconds  Total Procedure Duration: 0 hours 17 minutes 46 seconds       [facility]

## 2015-06-11 NOTE — Anesthesia Preprocedure Evaluation (Addendum)
Anesthesia Evaluation  Patient identified by MRN, date of birth, ID band Patient awake    Reviewed: Allergy & Precautions, H&P , NPO status , Patient's Chart, lab work & pertinent test results, reviewed documented beta blocker date and time   Airway Mallampati: II  TM Distance: >3 FB Neck ROM: full    Dental  (+) Chipped   Pulmonary sleep apnea and Continuous Positive Airway Pressure Ventilation , former smoker,  breath sounds clear to auscultation  Pulmonary exam normal       Cardiovascular negative cardio ROS Normal cardiovascular examRhythm:regular Rate:Normal     Neuro/Psych  Neuromuscular disease negative psych ROS   GI/Hepatic negative GI ROS, Neg liver ROS,   Endo/Other  negative endocrine ROS  Renal/GU negative Renal ROS  negative genitourinary   Musculoskeletal   Abdominal   Peds  Hematology negative hematology ROS (+)   Anesthesia Other Findings Past Medical History:   Personal history of tobacco use, presenting ha*              Multiple sclerosis                                           Diffuse cystic mastopathy                                    Family history of malignant neoplasm of gastro* 2012         Obesity, unspecified                                         Special screening for malignant neoplasms, col*              Cancer                                          2014           Comment:right breast - Invasive Mammary Carcinoma   Cancer                                          2014           Comment:left breast - Invasive lobular carcinoma and               DCIS    Fractured elbow                                 08/28/14      Hx of metabolic acidosis with increased anion *              Increased heart rate                                         Reproductive/Obstetrics negative OB ROS  Anesthesia Physical Anesthesia Plan  ASA:  III  Anesthesia Plan: General   Post-op Pain Management:    Induction:   Airway Management Planned:   Additional Equipment:   Intra-op Plan:   Post-operative Plan:   Informed Consent: I have reviewed the patients History and Physical, chart, labs and discussed the procedure including the risks, benefits and alternatives for the proposed anesthesia with the patient or authorized representative who has indicated his/her understanding and acceptance.   Dental Advisory Given  Plan Discussed with: Anesthesiologist, CRNA and Surgeon  Anesthesia Plan Comments:         Anesthesia Quick Evaluation

## 2015-06-11 NOTE — H&P (Signed)
Cassidy Norris is an 64 y.o. female.   Chief Complaint: history of colon polyp HPI: Pt here for surveillance colonoscopy. Has history ogf polyp, history of breast CA-has had bil mastectomy  Past Medical History  Diagnosis Date  . Personal history of tobacco use, presenting hazards to health   . Multiple sclerosis   . Diffuse cystic mastopathy   . Family history of malignant neoplasm of gastrointestinal tract 2012  . Obesity, unspecified   . Special screening for malignant neoplasms, colon   . Cancer 2014    right breast - Invasive Mammary Carcinoma  . Cancer 2014    left breast - Invasive lobular carcinoma and DCIS   . Fractured elbow 08/28/14  . Hx of metabolic acidosis with increased anion gap   . Increased heart rate     Past Surgical History  Procedure Laterality Date  . Wisdom tooth extraction  2006  . Polypectomy  1989  . Dilation and curettage of uterus  1983  . Colonoscopy  2010    Dr. Jamal Collin  . Tubal ligation    . Portacath placement  2015  . Spine surgery  09/14/14  . Breast biopsy Right 1973,2001  . Breast biopsy Right 11-07-13    INVASIVE MAMMARY CARCINOMA , ER/PR positive Her2 negative  . Breast biopsy Left 11-27-13    invasive lobular and DCIS  . Breast surgery Bilateral 01/09/14    mastectomy    Family History  Problem Relation Age of Onset  . Cancer Mother     lung  . Cancer Father     colon, stomach  . Diabetes Maternal Grandmother   . Rectal cancer Paternal Uncle   . Rectal cancer Paternal Uncle    Social History:  reports that she quit smoking about 17 months ago. Her smoking use included Cigarettes. She has a 30 pack-year smoking history. She has never used smokeless tobacco. She reports that she does not drink alcohol or use illicit drugs.  Allergies: No Known Allergies  Medications Prior to Admission  Medication Sig Dispense Refill  . acetaminophen (TYLENOL) 325 MG tablet Take 650 mg by mouth every 6 (six) hours as needed.    . Calcium  Carbonate (CALCIUM 600 PO) Take 1 tablet by mouth 2 (two) times daily.    . carvedilol (COREG) 3.125 MG tablet Take 1 tablet (3.125 mg total) by mouth 2 (two) times daily with a meal. 180 tablet 3  . cholecalciferol (VITAMIN D) 1000 UNITS tablet Take 2,000 Units by mouth daily.    . Cyanocobalamin (VITAMIN B-12) 5000 MCG LOZG Take by mouth daily.    . diphenhydrAMINE (SOMINEX) 25 MG tablet Take 25 mg by mouth at bedtime as needed for sleep.    Marland Kitchen exemestane (AROMASIN) 25 MG tablet Take 1 tablet (25 mg total) by mouth daily after breakfast. 30 tablet 6  . ibuprofen (ADVIL,MOTRIN) 200 MG tablet Take 400 mg by mouth every 6 (six) hours as needed.    . Melatonin 3 MG TABS Take 1 tablet by mouth daily.    . meloxicam (MOBIC) 15 MG tablet Take 15 mg by mouth daily.    . polyethylene glycol powder (GLYCOLAX/MIRALAX) powder 255 grams one bottle for colonoscopy prep 255 g 0  . pramipexole (MIRAPEX) 0.5 MG tablet Take 0.5 mg by mouth daily.   2  . traZODone (DESYREL) 50 MG tablet Take 50 mg by mouth at bedtime.    . Turmeric Curcumin 500 MG CAPS Take by mouth.  No results found for this or any previous visit (from the past 48 hour(s)). No results found.  ROS  Blood pressure 139/83, pulse 88, temperature 99 F (37.2 C), temperature source Oral, resp. rate 22, height 5' 5"  (1.651 m), weight 196 lb (88.905 kg), SpO2 98 %. Physical Exam  Constitutional: She appears well-developed and well-nourished.  Eyes: Conjunctivae are normal. No scleral icterus.  Neck: Neck supple. No thyromegaly present.  Cardiovascular: Normal rate, regular rhythm and normal heart sounds.   Respiratory: Effort normal and breath sounds normal.  GI: Soft. Bowel sounds are normal. She exhibits no mass. There is no tenderness.  Lymphadenopathy:    She has no cervical adenopathy.     Assessment/Plan Proceed with colonoscopy as planned  SANKAR,SEEPLAPUTHUR G 06/11/2015, 2:02 PM

## 2015-06-11 NOTE — Anesthesia Postprocedure Evaluation (Signed)
  Anesthesia Post-op Note  Patient: Cassidy Norris  Procedure(s) Performed: Procedure(s): COLONOSCOPY WITH PROPOFOL (N/A)  Anesthesia type:General  Patient location: PACU  Post pain: Pain level controlled  Post assessment: Post-op Vital signs reviewed, Patient's Cardiovascular Status Stable, Respiratory Function Stable, Patent Airway and No signs of Nausea or vomiting  Post vital signs: Reviewed and stable  Last Vitals:  Filed Vitals:   06/11/15 1509  BP: 129/79  Pulse: 79  Temp:   Resp:     Level of consciousness: awake, alert  and patient cooperative  Complications: No apparent anesthesia complications

## 2015-06-17 ENCOUNTER — Encounter: Payer: Self-pay | Admitting: General Surgery

## 2015-07-18 ENCOUNTER — Other Ambulatory Visit
Admission: RE | Admit: 2015-07-18 | Discharge: 2015-07-18 | Disposition: A | Discharge status: Home / Self Care | Payer: BLUE CROSS/BLUE SHIELD | Attending: Occupational Medicine | Admitting: Occupational Medicine

## 2015-07-18 DIAGNOSIS — Z139 Encounter for screening, unspecified: Secondary | ICD-10-CM | POA: Insufficient documentation

## 2015-07-19 ENCOUNTER — Telehealth: Payer: Self-pay | Admitting: Neurology

## 2015-07-19 NOTE — Telephone Encounter (Signed)
As she is not on an immunosuppressant drug for MS, she may have the flu shot and TDAP

## 2015-07-19 NOTE — Telephone Encounter (Signed)
Pt called and she is at [facility] and they are wanting to give her a Flu shot and TDAP and she wanted to know if that is ok with her MS, she will be there for another hour and would like a call back/Dawn CB# [phone]

## 2015-07-19 NOTE — Telephone Encounter (Signed)
Patient notified

## 2015-07-19 NOTE — Telephone Encounter (Signed)
Please advise 

## 2015-07-22 ENCOUNTER — Encounter: Payer: Self-pay | Admitting: Unknown Physician Specialty

## 2015-07-22 ENCOUNTER — Ambulatory Visit (INDEPENDENT_AMBULATORY_CARE_PROVIDER_SITE_OTHER): Payer: BLUE CROSS/BLUE SHIELD | Admitting: Unknown Physician Specialty

## 2015-07-22 ENCOUNTER — Other Ambulatory Visit: Payer: Self-pay | Admitting: Unknown Physician Specialty

## 2015-07-22 VITALS — BP 132/82 | HR 91 | Temp 98.5°F | Ht 64.5 in | Wt 202.2 lb

## 2015-07-22 DIAGNOSIS — G4700 Insomnia, unspecified: Secondary | ICD-10-CM

## 2015-07-22 DIAGNOSIS — Z0000 Encounter for general adult medical examination without abnormal findings: Secondary | ICD-10-CM

## 2015-07-22 DIAGNOSIS — G2581 Restless legs syndrome: Secondary | ICD-10-CM | POA: Diagnosis not present

## 2015-07-22 MED ORDER — PRAMIPEXOLE DIHYDROCHLORIDE 0.5 MG PO TABS: 0.5000 mg | ORAL_TABLET | Freq: Every day | ORAL | Status: DC

## 2015-07-22 MED ORDER — TRAZODONE HCL 50 MG PO TABS: 50.0000 mg | ORAL_TABLET | Freq: Every day | ORAL | Status: DC

## 2015-07-22 NOTE — Assessment & Plan Note (Signed)
Stable, continue present medications.   

## 2015-07-22 NOTE — Patient Instructions (Addendum)
Insomnia Insomnia is frequent trouble falling and/or staying asleep. Insomnia can be a long term problem or a short term problem. Both are common. Insomnia can be a short term problem when the wakefulness is related to a certain stress or worry. Long term insomnia is often related to ongoing stress during waking hours and/or poor sleeping habits. Overtime, sleep deprivation itself can make the problem worse. Every little thing feels more severe because you are overtired and your ability to cope is decreased. CAUSES   Stress, anxiety, and depression.  Poor sleeping habits.  Distractions such as TV in the bedroom.  Naps close to bedtime.  Engaging in emotionally charged conversations before bed.  Technical reading before sleep.  Alcohol and other sedatives. They may make the problem worse. They can hurt normal sleep patterns and normal dream activity.  Stimulants such as caffeine for several hours prior to bedtime.  Pain syndromes and shortness of breath can cause insomnia.  Exercise late at night.  Changing time zones may cause sleeping problems (jet lag). It is sometimes helpful to have someone observe your sleeping patterns. They should look for periods of not breathing during the night (sleep apnea). They should also look to see how long those periods last. If you live alone or observers are uncertain, you can also be observed at a sleep clinic where your sleep patterns will be professionally monitored. Sleep apnea requires a checkup and treatment. Give your caregivers your medical history. Give your caregivers observations your family has made about your sleep.  SYMPTOMS   Not feeling rested in the morning.  Anxiety and restlessness at bedtime.  Difficulty falling and staying asleep. TREATMENT   Your caregiver may prescribe treatment for an underlying medical disorders. Your caregiver can give advice or help if you are using alcohol or other drugs for self-medication. Treatment  of underlying problems will usually eliminate insomnia problems.  Medications can be prescribed for short time use. They are generally not recommended for lengthy use.  Over-the-counter sleep medicines are not recommended for lengthy use. They can be habit forming.  You can promote easier sleeping by making lifestyle changes such as:  Using relaxation techniques that help with breathing and reduce muscle tension.  Exercising earlier in the day.  Changing your diet and the time of your last meal. No night time snacks.  Establish a regular time to go to bed.  Counseling can help with stressful problems and worry.  Soothing music and white noise may be helpful if there are background noises you cannot remove.  Stop tedious detailed work at least one hour before bedtime. HOME CARE INSTRUCTIONS   Keep a diary. Inform your caregiver about your progress. This includes any medication side effects. See your caregiver regularly. Take note of:  Times when you are asleep.  Times when you are awake during the night.  The quality of your sleep.  How you feel the next day. This information will help your caregiver care for you.  Get out of bed if you are still awake after 15 minutes. Read or do some quiet activity. Keep the lights down. Wait until you feel sleepy and go back to bed.  Keep regular sleeping and waking hours. Avoid naps.  Exercise regularly.  Avoid distractions at bedtime. Distractions include watching television or engaging in any intense or detailed activity like attempting to balance the household checkbook.  Develop a bedtime ritual. Keep a familiar routine of bathing, brushing your teeth, climbing into bed at the same   time each night, listening to soothing music. Routines increase the success of falling to sleep faster.  Use relaxation techniques. This can be using breathing and muscle tension release routines. It can also include visualizing peaceful scenes. You can  also help control troubling or intruding thoughts by keeping your mind occupied with boring or repetitive thoughts like the old concept of counting sheep. You can make it more creative like imagining planting one beautiful flower after another in your backyard garden.  During your day, work to eliminate stress. When this is not possible use some of the previous suggestions to help reduce the anxiety that accompanies stressful situations. MAKE SURE YOU:   Understand these instructions.  Will watch your condition.  Will get help right away if you are not doing well or get worse. Document Released: 11/06/2000 Document Revised: 02/01/2012 Document Reviewed: 12/07/2007 [facility] Patient Information 2015 [place], [place]. This information is not intended to replace advice given to you by your health care provider. Make sure you discuss any questions you have with your health care provider.  Try: CBTforsleep.com shuti.com

## 2015-07-22 NOTE — Progress Notes (Signed)
BP 132/82 mmHg  Pulse 91  Temp(Src) 98.5 F (36.9 C)  Ht 5' 4.5" (1.638 m)  Wt 202 lb 3.2 oz (91.717 kg)  BMI 34.18 kg/m2  SpO2 98%  LMP 12/22/1999 (Approximate)   Subjective:    Patient ID: Cassidy Norris, female    DOB: 08/16/1951, 64 y.o.   MRN: [id number]  HPI: Cassidy Norris is a 64 y.o. female  Chief Complaint  Patient presents with  . Annual Exam    Relevant past medical, surgical, family and social history reviewed and updated as indicated. Interim medical history since our last visit reviewed. Allergies and medications reviewed and updated.  Insomnia Doing better with Trazadone.  States she uses a Cpap.  She wakes up but goes back to sleep.  States she stays tired all the time and takes a B12.   RLS is stable on current medication.  She does have an exercise program 2 days a week.     Review of Systems  Constitutional: Negative.   HENT: Negative.   Eyes: Negative.   Respiratory: Negative.   Cardiovascular: Negative.   Gastrointestinal: Negative.   Endocrine: Negative.   Genitourinary: Negative.   Musculoskeletal: Negative.   Skin: Negative.   Allergic/Immunologic: Negative.   Neurological: Negative.   Hematological: Negative.   Psychiatric/Behavioral: Negative.     Per HPI unless specifically indicated above     Objective:    BP 132/82 mmHg  Pulse 91  Temp(Src) 98.5 F (36.9 C)  Ht 5' 4.5" (1.638 m)  Wt 202 lb 3.2 oz (91.717 kg)  BMI 34.18 kg/m2  SpO2 98%  LMP 12/22/1999 (Approximate)  Wt Readings from Last 3 Encounters:  07/22/15 202 lb 3.2 oz (91.717 kg)  06/11/15 196 lb (88.905 kg)  05/29/15 206 lb 9.1 oz (93.7 kg)    Physical Exam  Constitutional: She is oriented to person, place, and time. She appears well-developed and well-nourished. No distress.  HENT:  Head: Normocephalic and atraumatic.  Eyes: Conjunctivae and lids are normal. Right eye exhibits no discharge. Left eye exhibits no discharge. No scleral icterus.   Cardiovascular: Normal rate, regular rhythm and normal heart sounds.   Pulmonary/Chest: Effort normal and breath sounds normal. No respiratory distress.  Abdominal: Soft. Normal appearance and bowel sounds are normal. She exhibits no distension. There is no splenomegaly or hepatomegaly. There is no tenderness.  Musculoskeletal: Normal range of motion.  Neurological: She is alert and oriented to person, place, and time.  Skin: Skin is intact. No rash noted. No pallor.  Psychiatric: She has a normal mood and affect. Her behavior is normal. Judgment and thought content normal.  Nursing note and vitals reviewed. Breast exam through   Results for orders placed or performed in visit on 05/29/15  Vitamin D 25 hydroxy  Result Value Ref Range   Vit D, 25-Hydroxy 41.0 30.0 - 100.0 ng/mL  CBC with Differential  Result Value Ref Range   WBC 9.1 3.6 - 11.0 K/uL   RBC 4.60 3.80 - 5.20 MIL/uL   Hemoglobin 14.3 12.0 - 16.0 g/dL   HCT 43.3 35.0 - 47.0 %   MCV 94.1 80.0 - 100.0 fL   MCH 31.1 26.0 - 34.0 pg   MCHC 33.0 32.0 - 36.0 g/dL   RDW 13.7 11.5 - 14.5 %   Platelets 224 150 - 440 K/uL   Neutrophils Relative % 64 %   Neutro Abs 5.8 1.4 - 6.5 K/uL   Lymphocytes Relative 23 %   Lymphs Abs  2.1 1.0 - 3.6 K/uL   Monocytes Relative 9 %   Monocytes Absolute 0.8 0.2 - 0.9 K/uL   Eosinophils Relative 3 %   Eosinophils Absolute 0.3 0 - 0.7 K/uL   Basophils Relative 1 %   Basophils Absolute 0.1 0 - 0.1 K/uL  Comprehensive metabolic panel  Result Value Ref Range   Sodium 137 135 - 145 mmol/L   Potassium 3.9 3.5 - 5.1 mmol/L   Chloride 102 101 - 111 mmol/L   CO2 26 22 - 32 mmol/L   Glucose, Bld 136 (H) 65 - 99 mg/dL   BUN 20 6 - 20 mg/dL   Creatinine, Ser 0.72 0.44 - 1.00 mg/dL   Calcium 9.0 8.9 - 10.3 mg/dL   Total Protein 7.1 6.5 - 8.1 g/dL   Albumin 4.1 3.5 - 5.0 g/dL   AST 30 15 - 41 U/L   ALT 28 14 - 54 U/L   Alkaline Phosphatase 70 38 - 126 U/L   Total Bilirubin 0.7 0.3 - 1.2 mg/dL    GFR calc non Af Amer >60 >60 mL/min   GFR calc Af Amer >60 >60 mL/min   Anion gap 9 5 - 15      Assessment & Plan:   Problem List Items Addressed This Visit      Unprioritized   RLS (restless legs syndrome) - Primary    Stable, continue present medications.       Insomnia    Stable, continue present medications.        Other Visit Diagnoses    Annual physical exam            Follow up plan: Return in about 1 year (around 07/21/2016).

## 2015-07-23 LAB — LIPID PANEL W/O CHOL/HDL RATIO
CHOLESTEROL TOTAL: 169 mg/dL (ref 100–199)
HDL: 33 mg/dL — AB (ref >39)
LDL Calculated: 97 mg/dL (ref 0–99)
TRIGLYCERIDES: 197 mg/dL — AB (ref 0–149)
VLDL CHOLESTEROL CAL: 39 mg/dL (ref 5–40)

## 2015-09-03 ENCOUNTER — Telehealth: Payer: Self-pay | Admitting: Neurology

## 2015-09-03 NOTE — Telephone Encounter (Signed)
Pt called for paperwork for a Vitamin D Lab test/ call back @ [phone]

## 2015-09-04 ENCOUNTER — Other Ambulatory Visit: Payer: Self-pay | Admitting: *Deleted

## 2015-09-04 DIAGNOSIS — G609 Hereditary and idiopathic neuropathy, unspecified: Secondary | ICD-10-CM

## 2015-09-04 DIAGNOSIS — E559 Vitamin D deficiency, unspecified: Secondary | ICD-10-CM

## 2015-09-04 NOTE — Telephone Encounter (Signed)
Patient requested an order for her vitamin d level to be checked.  Order mailed to patient.

## 2015-09-09 ENCOUNTER — Ambulatory Visit: Payer: BLUE CROSS/BLUE SHIELD | Admitting: Oncology

## 2015-09-09 ENCOUNTER — Other Ambulatory Visit: Payer: BLUE CROSS/BLUE SHIELD

## 2015-09-11 ENCOUNTER — Telehealth: Payer: Self-pay

## 2015-09-11 NOTE — Telephone Encounter (Signed)
Received clearance from [facility] via fax  Pt to have left total knee replacement. Surgery date pending Placed in Arida's in box

## 2015-09-16 ENCOUNTER — Telehealth: Payer: Self-pay

## 2015-09-16 NOTE — Telephone Encounter (Signed)
Clearance faxed to [employer], [phone]

## 2015-09-23 ENCOUNTER — Encounter: Payer: Self-pay | Admitting: Unknown Physician Specialty

## 2015-09-23 ENCOUNTER — Ambulatory Visit (INDEPENDENT_AMBULATORY_CARE_PROVIDER_SITE_OTHER): Payer: BLUE CROSS/BLUE SHIELD | Admitting: Unknown Physician Specialty

## 2015-09-23 VITALS — BP 137/82 | HR 77 | Temp 98.5°F | Ht 65.1 in | Wt 209.8 lb

## 2015-09-23 DIAGNOSIS — R7301 Impaired fasting glucose: Secondary | ICD-10-CM

## 2015-09-23 DIAGNOSIS — Z0000 Encounter for general adult medical examination without abnormal findings: Secondary | ICD-10-CM | POA: Diagnosis not present

## 2015-09-23 DIAGNOSIS — Z01818 Encounter for other preprocedural examination: Secondary | ICD-10-CM | POA: Diagnosis not present

## 2015-09-23 LAB — BAYER DCA HB A1C WAIVED: HB A1C (BAYER DCA - WAIVED): 6.1 % (ref <7.0)

## 2015-09-23 NOTE — Assessment & Plan Note (Signed)
Pt with Hgb A1C of 6.1.  Discussed diet changes.

## 2015-09-23 NOTE — Progress Notes (Signed)
BP 137/82 mmHg  Pulse 77  Temp(Src) 98.5 F (36.9 C)  Ht 5' 5.1" (1.654 m)  Wt 209 lb 12.8 oz (95.165 kg)  BMI 34.79 kg/m2  SpO2 99%  LMP 12/22/1999 (Approximate)   Subjective:    Patient ID: Cassidy Norris, female    DOB: 1951/04/30, 64 y.o.   MRN: [id number]  HPI: Cassidy Norris is a 64 y.o. female  Chief Complaint  Patient presents with  . OTHER    pt states she is here for surgical clearence for a total knee replacment on left knee     Relevant past medical, surgical, family and social history reviewed and updated as indicated. Interim medical history since our last visit reviewed. Allergies and medications reviewed and updated.  Review of Systems  Constitutional: Negative.   HENT: Negative.   Eyes: Negative.   Respiratory: Negative.   Cardiovascular: Negative.   Gastrointestinal: Negative.   Endocrine: Negative.   Genitourinary: Negative.   Musculoskeletal: Negative.   Skin: Negative.   Allergic/Immunologic: Negative.   Neurological: Negative.   Hematological: Negative.   Psychiatric/Behavioral: Negative.     Per HPI unless specifically indicated above     Objective:    BP 137/82 mmHg  Pulse 77  Temp(Src) 98.5 F (36.9 C)  Ht 5' 5.1" (1.654 m)  Wt 209 lb 12.8 oz (95.165 kg)  BMI 34.79 kg/m2  SpO2 99%  LMP 12/22/1999 (Approximate)  Wt Readings from Last 3 Encounters:  09/23/15 209 lb 12.8 oz (95.165 kg)  07/22/15 202 lb 3.2 oz (91.717 kg)  06/11/15 196 lb (88.905 kg)    Physical Exam  Constitutional: She is oriented to person, place, and time. She appears well-developed and well-nourished. No distress.  HENT:  Head: Normocephalic and atraumatic.  Eyes: Conjunctivae and lids are normal. Right eye exhibits no discharge. Left eye exhibits no discharge. No scleral icterus.  Cardiovascular: Normal rate, regular rhythm and normal heart sounds.  Exam reveals no gallop and no friction rub.   No murmur heard. Pulmonary/Chest: Effort normal and  breath sounds normal. No respiratory distress.  Abdominal: Soft. Normal appearance and bowel sounds are normal. She exhibits no distension. There is no splenomegaly or hepatomegaly. There is no tenderness. There is no rebound.  Musculoskeletal: Normal range of motion.  Neurological: She is alert and oriented to person, place, and time.  Skin: Skin is warm, dry and intact. No rash noted. No pallor.  Psychiatric: She has a normal mood and affect. Her behavior is normal. Judgment and thought content normal.   EKG without acute changes  Results for orders placed or performed in visit on 07/22/15  Lipid Panel w/o Chol/HDL Ratio  Result Value Ref Range   Cholesterol, Total 169 100 - 199 mg/dL   Triglycerides 197 (H) 0 - 149 mg/dL   HDL 33 (L) >39 mg/dL   VLDL Cholesterol Cal 39 5 - 40 mg/dL   LDL Calculated 97 0 - 99 mg/dL      Assessment & Plan:   Problem List Items Addressed This Visit      Unprioritized   IFG (impaired fasting glucose)    Pt with Hgb A1C of 6.1.  Discussed diet changes.        Relevant Orders   Bayer DCA Hb A1c Waived    Other Visit Diagnoses    Pre-operative clearance    -  Primary    Relevant Medications    fluticasone (FLONASE) 50 MCG/ACT nasal spray    Other Relevant  Orders    EKG 12-Lead (Completed)    Comprehensive metabolic panel    CBC with Differential/Platelet    Routine health maintenance        Relevant Orders    HIV antibody    Hepatitis C antibody      OK for surgery Follow up plan: Return if symptoms worsen or fail to improve, for physical.

## 2015-09-24 ENCOUNTER — Encounter: Payer: Self-pay | Admitting: Unknown Physician Specialty

## 2015-09-24 LAB — COMPREHENSIVE METABOLIC PANEL
ALK PHOS: 75 IU/L (ref 39–117)
ALT: 24 IU/L (ref 0–32)
AST: 24 IU/L (ref 0–40)
Albumin/Globulin Ratio: 1.8 (ref 1.1–2.5)
Albumin: 4.4 g/dL (ref 3.6–4.8)
BILIRUBIN TOTAL: 0.5 mg/dL (ref 0.0–1.2)
BUN / CREAT RATIO: 19 (ref 11–26)
BUN: 13 mg/dL (ref 8–27)
CHLORIDE: 103 mmol/L (ref 97–106)
CO2: 25 mmol/L (ref 18–29)
Calcium: 9.9 mg/dL (ref 8.7–10.3)
Creatinine, Ser: 0.68 mg/dL (ref 0.57–1.00)
GFR calc non Af Amer: 93 mL/min/{1.73_m2} (ref >59)
GFR, EST AFRICAN AMERICAN: 108 mL/min/{1.73_m2} (ref >59)
GLUCOSE: 97 mg/dL (ref 65–99)
Globulin, Total: 2.4 g/dL (ref 1.5–4.5)
POTASSIUM: 4.4 mmol/L (ref 3.5–5.2)
Sodium: 142 mmol/L (ref 136–144)
Total Protein: 6.8 g/dL (ref 6.0–8.5)

## 2015-09-24 LAB — CBC WITH DIFFERENTIAL/PLATELET
BASOS ABS: 0 10*3/uL (ref 0.0–0.2)
Basos: 0 %
EOS (ABSOLUTE): 0.2 10*3/uL (ref 0.0–0.4)
Eos: 3 %
HEMOGLOBIN: 14.2 g/dL (ref 11.1–15.9)
Hematocrit: 43.2 % (ref 34.0–46.6)
Immature Grans (Abs): 0 10*3/uL (ref 0.0–0.1)
Immature Granulocytes: 0 %
LYMPHS ABS: 2.4 10*3/uL (ref 0.7–3.1)
Lymphs: 25 %
MCH: 31.8 pg (ref 26.6–33.0)
MCHC: 32.9 g/dL (ref 31.5–35.7)
MCV: 97 fL (ref 79–97)
Monocytes Absolute: 0.7 10*3/uL (ref 0.1–0.9)
Monocytes: 7 %
NEUTROS ABS: 6.1 10*3/uL (ref 1.4–7.0)
Neutrophils: 65 %
Platelets: 232 10*3/uL (ref 150–379)
RBC: 4.46 x10E6/uL (ref 3.77–5.28)
RDW: 13.5 % (ref 12.3–15.4)
WBC: 9.4 10*3/uL (ref 3.4–10.8)

## 2015-09-24 LAB — HIV ANTIBODY (ROUTINE TESTING W REFLEX): HIV Screen 4th Generation wRfx: NONREACTIVE

## 2015-09-24 LAB — HEPATITIS C ANTIBODY

## 2015-09-24 NOTE — Progress Notes (Signed)
Quick Note:  Patient notified by mychart normal labs except elevated blood sugar. ______

## 2015-09-30 ENCOUNTER — Telehealth: Payer: Self-pay | Admitting: *Deleted

## 2015-09-30 ENCOUNTER — Encounter: Payer: Self-pay | Admitting: Neurology

## 2015-09-30 ENCOUNTER — Ambulatory Visit (INDEPENDENT_AMBULATORY_CARE_PROVIDER_SITE_OTHER): Payer: BLUE CROSS/BLUE SHIELD | Admitting: Neurology

## 2015-09-30 VITALS — BP 132/80 | HR 90 | Ht 65.0 in | Wt 209.0 lb

## 2015-09-30 DIAGNOSIS — G35 Multiple sclerosis: Secondary | ICD-10-CM

## 2015-09-30 DIAGNOSIS — G620 Drug-induced polyneuropathy: Secondary | ICD-10-CM | POA: Diagnosis not present

## 2015-09-30 DIAGNOSIS — M5416 Radiculopathy, lumbar region: Secondary | ICD-10-CM | POA: Diagnosis not present

## 2015-09-30 DIAGNOSIS — T451X5A Adverse effect of antineoplastic and immunosuppressive drugs, initial encounter: Secondary | ICD-10-CM

## 2015-09-30 NOTE — Telephone Encounter (Signed)
Called to inquire if release has been sent to Dr French Ana for her to have knee surgery

## 2015-09-30 NOTE — Patient Instructions (Signed)
I think that the problems with the legs are due to the back and knees rather than the MS Continue D3 4000 units daily Follow up in 6 months.

## 2015-09-30 NOTE — Progress Notes (Signed)
NEUROLOGY FOLLOW UP OFFICE NOTE  MALYNDA SMOLINSKI [id number]  HISTORY OF PRESENT ILLNESS: Cassidy Norris is a 64 year old right-handed woman with multiple sclerosis, former smoker, status post bilateral breast cancer 04-Mar-2014 status post bilateral mastectomy and status post L4-5 laminectomy who follows up for multiple sclerosis.  She is accompanied by her sister who provides some history.  Labs reviewed.    UPDATE: She is not on a disease-modifying agent.  She takes vitamin D 4000 u daily. Level from 09/09/15 was 53.5. She has upcoming surgery for her knee.  She has continued pain in the knees. She failed steroid and gel shots.  She also continues to occasionally have pain down the left leg since her ruptured lumbar disc.  She reports fatigue.  If she is on her knees, she feels weak in the legs and has trouble getting up.  HISTORY: Her husband passed away due to [place] in Mar 05, 1999.  In November 2001, she tripped and fell, hitting her head.  She went to the ED where a CT of the head was reportedly unremarkable.  She was advised to have an MRI of the brain, which was performed on 12/12/00.  It revealed small round and elliptical white matter lesions less than 1 cm in the periventricular region.  In December of 2002, she followed up with a neurologist, Dr. Corinna Capra.  VEP performed in 03-04-02 showed prolonged p100 latency in the right optic nerve.  BAER was normal.  She reportedly had a lumbar puncture where CSF results were supportive of MS.  She was given 5 days of Solu-Medrol followed by 3 weeks of oral prednisone.  She was advised to start an interferon, but due to possible side effects, she decided to hold off and see if she clinically progresses.  She did have a follow up MRI of the brain in December 2004, which reportedly showed an enhancing lesion as well as a remote lesion in the cerebellum in addition to the periventricular white matter lesions.  She never had any noticeable clinical flare-ups and had done  well off of a disease-modifying agent.  Over the past 3 years, she notes possible worsening in depth perception.  Sometimes she notes difficulty grabbing for objects or will drive too close to the curb.  She had an MRI of the brain and cervical spine with and without contrast on 03/12/15, which showed non-enhancing bilateral periventricular white matter lesions oriented perpendicular to the ventricles and corpus callosum, as well as involving temporal lobe white matter and mild involvement of the left side of cerebellum.  No cervical cord lesions were seen.  In 04-Mar-2014, she was diagnosed with bilateral breast cancer.  She underwent bilateral mastectomy and chemotherapy, which ended in September.  As a result of the chemotherapy, she has neuropathy in the feet.  She also had problems with her left leg and was found to have a ruptured disc at L4-5 and underwent surgery in October 2015.  Since chemotherapy, she notes some mild memory problems.  She sometimes misplaces objects or will forget if she locked the front door when she left.  She will sometimes have difficulty getting a word out.  PAST MEDICAL HISTORY: Past Medical History  Diagnosis Date  . Personal history of tobacco use, presenting hazards to health   . Multiple sclerosis ([place])   . Diffuse cystic mastopathy   . Family history of malignant neoplasm of gastrointestinal tract 2011-03-05  . Obesity, unspecified   . Special screening for malignant neoplasms, colon   .  Cancer [facility]) 2014    right breast - Invasive Mammary Carcinoma  . Cancer [facility]) 2014    left breast - Invasive lobular carcinoma and DCIS   . Fractured elbow 08/28/14  . Hx of metabolic acidosis with increased anion gap   . Increased heart rate     MEDICATIONS: Current Outpatient Prescriptions on File Prior to Visit  Medication Sig Dispense Refill  . acetaminophen (TYLENOL) 325 MG tablet Take 650 mg by mouth every 6 (six) hours as needed.    . Calcium Carbonate (CALCIUM 600 PO) Take  1 tablet by mouth 2 (two) times daily.    . carvedilol (COREG) 3.125 MG tablet Take 1 tablet (3.125 mg total) by mouth 2 (two) times daily with a meal. 180 tablet 3  . cholecalciferol (VITAMIN D) 1000 UNITS tablet Take 2,000 Units by mouth daily.    . Cyanocobalamin (VITAMIN B-12) 5000 MCG LOZG Take by mouth daily.    . diphenhydrAMINE (SOMINEX) 25 MG tablet Take 25 mg by mouth at bedtime as needed for sleep.    Marland Kitchen exemestane (AROMASIN) 25 MG tablet Take 1 tablet (25 mg total) by mouth daily after breakfast. 30 tablet 6  . fluticasone (FLONASE) 50 MCG/ACT nasal spray Place 2 sprays into both nostrils daily.   1  . meloxicam (MOBIC) 15 MG tablet Take 15 mg by mouth daily.    . pramipexole (MIRAPEX) 0.5 MG tablet Take 1 tablet (0.5 mg total) by mouth daily. 90 tablet 3  . traZODone (DESYREL) 50 MG tablet Take 1 tablet (50 mg total) by mouth at bedtime. 90 tablet 3   No current facility-administered medications on file prior to visit.    ALLERGIES: No Known Allergies  FAMILY HISTORY: Family History  Problem Relation Age of Onset  . Cancer Mother     lung  . Cancer Father     colon, stomach  . Diabetes Maternal Grandmother   . Rectal cancer Paternal Uncle   . Rectal cancer Paternal Uncle   . Hypertension Sister     SOCIAL HISTORY: Social History   Social History  . Marital Status: Widowed    Spouse Name: N/A  . Number of Children: N/A  . Years of Education: N/A   Occupational History  . Not on file.   Social History Main Topics  . Smoking status: Former Smoker -- 1.00 packs/day for 30 years    Types: Cigarettes    Quit date: 12/11/2013  . Smokeless tobacco: Never Used  . Alcohol Use: No     Comment: social  . Drug Use: No  . Sexual Activity: No   Other Topics Concern  . Not on file   Social History Narrative    REVIEW OF SYSTEMS: Constitutional: fatigue Eyes: No visual changes, double vision, eye pain Ear, nose and throat: No hearing loss, ear pain, nasal  congestion, sore throat Cardiovascular: No chest pain, palpitations Respiratory:  No shortness of breath at rest or with exertion, wheezes GastrointestinaI: No nausea, vomiting, diarrhea, abdominal pain, fecal incontinence Genitourinary:  No dysuria, urinary retention or frequency Musculoskeletal:  As above Integumentary: No rash, pruritus, skin lesions Neurological: as above Psychiatric: No depression, insomnia, anxiety Hematologic/Lymphatic:  No anemia, purpura, petechiae. Allergic/Immunologic: no itchy/runny eyes, nasal congestion, recent allergic reactions, rashes  PHYSICAL EXAM: Filed Vitals:   09/30/15 1310  BP: 132/80  Pulse: 90   General: No acute distress.    Head:  Normocephalic/atraumatic Eyes:  Fundoscopic exam unremarkable without vessel changes, exudates, hemorrhages or papilledema. Neck:  supple, no paraspinal tenderness, full range of motion Heart:  Regular rate and rhythm Lungs:  Clear to auscultation bilaterally Back: No paraspinal tenderness Neurological Exam: alert and oriented to person, place, and time. Attention span and concentration intact, recent and remote memory intact, fund of knowledge intact.  Speech fluent and not dysarthric, language intact.  CN II-XII intact. Fundoscopic exam unremarkable without vessel changes, exudates, hemorrhages or papilledema.  Bulk and tone normal, muscle strength 5/5 throughout.  Sensation to pinprick decreased in left foot, vibration intact.  Deep tendon reflexes 2+ throughout, toes downgoing.  Finger to nose and heel to shin testing intact.  Gait normal, Timed 25-foot walk 4.26 seconds, trouble with tandem walking, Romberg negative.  IMPRESSION: Multiple sclerosis. History is not consistent with relapsing-remitting form.  Possibly a very mild primary progressive MS.  It is stable.  I believe her pain and weakness in the legs are related to her knees and back.  Objective testing does not reveal any weakness.  PLAN: Continue D3  4000 units daily Follow up in 6 months.  Metta Clines, DO  CC:  Golden Pop, MD

## 2015-09-30 NOTE — Progress Notes (Signed)
Chart forwarded.  

## 2015-09-30 NOTE — Telephone Encounter (Signed)
Informed pt per [facility] that fax was set clearing form oncology point of view

## 2015-10-10 ENCOUNTER — Ambulatory Visit (INDEPENDENT_AMBULATORY_CARE_PROVIDER_SITE_OTHER): Payer: BLUE CROSS/BLUE SHIELD | Admitting: General Surgery

## 2015-10-10 ENCOUNTER — Encounter: Payer: Self-pay | Admitting: General Surgery

## 2015-10-10 VITALS — BP 158/82 | HR 82 | Resp 14 | Ht 66.0 in | Wt 219.0 lb

## 2015-10-10 DIAGNOSIS — Z800 Family history of malignant neoplasm of digestive organs: Secondary | ICD-10-CM

## 2015-10-10 DIAGNOSIS — Z860100 Personal history of colon polyps, unspecified: Secondary | ICD-10-CM | POA: Insufficient documentation

## 2015-10-10 DIAGNOSIS — C50912 Malignant neoplasm of unspecified site of left female breast: Secondary | ICD-10-CM | POA: Diagnosis not present

## 2015-10-10 DIAGNOSIS — C50911 Malignant neoplasm of unspecified site of right female breast: Secondary | ICD-10-CM | POA: Diagnosis not present

## 2015-10-10 NOTE — Patient Instructions (Signed)
Follow up in one year. Call with any questions

## 2015-10-10 NOTE — Progress Notes (Signed)
Patient ID: Cassidy Norris, female   DOB: 1951-10-19, 64 y.o.   MRN: [id number]  Chief Complaint  Patient presents with  . Follow-up    breast cancer    HPI Cassidy Norris is a 64 y.o. female here today for her follow up breast cancer check up. Patient states she is doing well and tolerating aromasin better than letrazole. I have reviewed the history of present illness with the patient.  HPI  Past Medical History  Diagnosis Date  . Personal history of tobacco use, presenting hazards to health   . Multiple sclerosis ([place])   . Diffuse cystic mastopathy   . Family history of malignant neoplasm of gastrointestinal tract 2012  . Obesity, unspecified   . Special screening for malignant neoplasms, colon   . Cancer [facility]) 2014    right breast - Invasive Mammary Carcinoma  . Cancer [facility]) 2014    left breast - Invasive lobular carcinoma and DCIS   . Fractured elbow 08/28/14  . Hx of metabolic acidosis with increased anion gap   . Increased heart rate     Past Surgical History  Procedure Laterality Date  . Wisdom tooth extraction  2006  . Polypectomy  1989  . Dilation and curettage of uterus  1983  . Colonoscopy  2010    Dr. Jamal Collin  . Tubal ligation    . Portacath placement  2015  . Spine surgery  09/14/14  . Breast biopsy Right 1973,2001  . Breast biopsy Right 11-07-13    INVASIVE MAMMARY CARCINOMA , ER/PR positive Her2 negative  . Breast biopsy Left 11-27-13    invasive lobular and DCIS  . Breast surgery Bilateral 01/09/14    mastectomy  . Colonoscopy with propofol N/A 06/11/2015    Procedure: COLONOSCOPY WITH PROPOFOL;  Surgeon: Christene Lye, MD;  Location: [facility] ENDOSCOPY;  Service: Endoscopy;  Laterality: N/A;    Family History  Problem Relation Age of Onset  . Cancer Mother     lung  . Cancer Father     colon, stomach  . Diabetes Maternal Grandmother   . Rectal cancer Paternal Uncle   . Rectal cancer Paternal Uncle   . Hypertension Sister     Social  History Social History  Substance Use Topics  . Smoking status: Former Smoker -- 1.00 packs/day for 30 years    Types: Cigarettes    Quit date: 12/11/2013  . Smokeless tobacco: Never Used  . Alcohol Use: No     Comment: social    No Known Allergies  Current Outpatient Prescriptions  Medication Sig Dispense Refill  . acetaminophen (TYLENOL) 325 MG tablet Take 650 mg by mouth every 6 (six) hours as needed.    . Calcium Carbonate (CALCIUM 600 PO) Take 1 tablet by mouth 2 (two) times daily.    . carvedilol (COREG) 3.125 MG tablet Take 1 tablet (3.125 mg total) by mouth 2 (two) times daily with a meal. 180 tablet 3  . cholecalciferol (VITAMIN D) 1000 UNITS tablet Take 2,000 Units by mouth daily.    . Cyanocobalamin (VITAMIN B-12) 5000 MCG LOZG Take by mouth daily.    . diphenhydrAMINE (SOMINEX) 25 MG tablet Take 25 mg by mouth at bedtime as needed for sleep.    Marland Kitchen exemestane (AROMASIN) 25 MG tablet Take 1 tablet (25 mg total) by mouth daily after breakfast. 30 tablet 6  . fluticasone (FLONASE) 50 MCG/ACT nasal spray Place 2 sprays into both nostrils daily.   1  . meloxicam (MOBIC)  15 MG tablet Take 15 mg by mouth daily.    . pramipexole (MIRAPEX) 0.5 MG tablet Take 1 tablet (0.5 mg total) by mouth daily. 90 tablet 3  . traZODone (DESYREL) 50 MG tablet Take 1 tablet (50 mg total) by mouth at bedtime. 90 tablet 3   No current facility-administered medications for this visit.    Review of Systems Review of Systems  Constitutional: Negative.   Respiratory: Negative.   Cardiovascular: Negative.     Blood pressure 158/82, pulse 82, resp. rate 14, height _0  (1.676 m), weight 219 lb (99.338 kg), last menstrual period 12/22/1999.  Physical Exam Physical Exam  Constitutional: She is oriented to person, place, and time. She appears well-developed and well-nourished.  Eyes: Conjunctivae are normal. No scleral icterus.  Neck: Neck supple.  Cardiovascular: Normal rate, regular rhythm and  normal heart sounds.   Pulmonary/Chest: Effort normal and breath sounds normal.  B/l mastectomy sites well healed with no signs of local recurrence  Abdominal: Soft. Bowel sounds are normal. There is no hepatomegaly. There is no tenderness.  Lymphadenopathy:    She has no cervical adenopathy.    She has no axillary adenopathy.  Neurological: She is alert and oriented to person, place, and time.  Skin: Skin is warm and dry.  Psychiatric: She has a normal mood and affect. Her behavior is normal.    Data Reviewed Progress notes reviewed  Assessment    Stable exam, history of b/l breast cancer s/p b/l mastectomies. On aromasin and doing well.      Plan    Follow up in 1 year.       PCP:  Alex Gardener 10/10/2015, 4:11 PM

## 2015-10-30 ENCOUNTER — Ambulatory Visit: Payer: Self-pay | Admitting: Physician Assistant

## 2015-10-30 NOTE — H&P (Signed)
TOTAL KNEE ADMISSION H&P  Patient is being admitted for left total knee arthroplasty.  Subjective:  Chief Complaint:left knee pain.  HPI: Cassidy Norris, 64 y.o. female, has a history of pain and functional disability in the left knee due to arthritis and has failed non-surgical conservative treatments for greater than 12 weeks to includeNSAID's and/or analgesics, corticosteriod injections, viscosupplementation injections, use of assistive devices and activity modification.  Onset of symptoms was gradual, starting 7 years ago with gradually worsening course since that time. The patient noted no past surgery on the left knee(s).  Patient currently rates pain in the left knee(s) at 10 out of 10 with activity. Patient has night pain, worsening of pain with activity and weight bearing, pain that interferes with activities of daily living, pain with passive range of motion, crepitus and joint swelling.  Patient has evidence of periarticular osteophytes and joint space narrowing by imaging studies.There is no active infection.  Patient Active Problem List   Diagnosis Date Noted  . History of colon polyps 10/10/2015  . Family history of colon cancer 10/10/2015  . Bilateral malignant neoplasm of breast in female [facility]) 10/10/2015  . IFG (impaired fasting glucose) 09/23/2015  . RLS (restless legs syndrome) 07/22/2015  . Insomnia 07/22/2015  . Tachycardia 04/19/2015  . Peripheral neuropathy due to chemotherapy ([place]) 03/19/2015  . Lumbar radiculopathy 03/19/2015  . Breast cancer bilateral, multifocal ER/PR pos, Her 2 neg,. 11/29/2013  . Multiple sclerosis ([place])   . Family history of malignant neoplasm of gastrointestinal tract    Past Medical History  Diagnosis Date  . Personal history of tobacco use, presenting hazards to health   . Multiple sclerosis ([place])   . Diffuse cystic mastopathy   . Family history of malignant neoplasm of gastrointestinal tract 2012  . Obesity, unspecified   . Special  screening for malignant neoplasms, colon   . Cancer [facility]) 2014    right breast - Invasive Mammary Carcinoma  . Cancer [facility]) 2014    left breast - Invasive lobular carcinoma and DCIS   . Fractured elbow 08/28/14  . Hx of metabolic acidosis with increased anion gap   . Increased heart rate     Past Surgical History  Procedure Laterality Date  . Wisdom tooth extraction  2006  . Polypectomy  1989  . Dilation and curettage of uterus  1983  . Colonoscopy  2010    Dr. Jamal Collin  . Tubal ligation    . Portacath placement  2015  . Spine surgery  09/14/14  . Breast biopsy Right 1973,2001  . Breast biopsy Right 11-07-13    INVASIVE MAMMARY CARCINOMA , ER/PR positive Her2 negative  . Breast biopsy Left 11-27-13    invasive lobular and DCIS  . Breast surgery Bilateral 01/09/14    mastectomy  . Colonoscopy with propofol N/A 06/11/2015    Procedure: COLONOSCOPY WITH PROPOFOL;  Surgeon: Christene Lye, MD;  Location: [facility] ENDOSCOPY;  Service: Endoscopy;  Laterality: N/A;     (Not in a hospital admission) No Known Allergies  Social History  Substance Use Topics  . Smoking status: Former Smoker -- 1.00 packs/day for 30 years    Types: Cigarettes    Quit date: 12/11/2013  . Smokeless tobacco: Never Used  . Alcohol Use: No     Comment: social    Family History  Problem Relation Age of Onset  . Cancer Mother     lung  . Cancer Father     colon, stomach  . Diabetes  Maternal Grandmother   . Rectal cancer Paternal Uncle   . Rectal cancer Paternal Uncle   . Hypertension Sister      Review of Systems  Cardiovascular: Positive for leg swelling.  Musculoskeletal: Positive for joint pain.  Skin: Positive for rash.  All other systems reviewed and are negative.   Objective:  Physical Exam  Constitutional: She is oriented to person, place, and time. She appears well-developed and well-nourished. No distress.  HENT:  Head: Normocephalic and atraumatic.  Nose: Nose normal.  Eyes:  Conjunctivae and EOM are normal. Pupils are equal, round, and reactive to light.  Neck: Normal range of motion. Neck supple.  Cardiovascular: Regular rhythm, normal heart sounds and intact distal pulses.  Tachycardia present.   Respiratory: Effort normal and breath sounds normal. No respiratory distress. She has no wheezes.  GI: Soft. Bowel sounds are normal. She exhibits no distension. There is no tenderness.  Musculoskeletal:       Left knee: She exhibits swelling. She exhibits no erythema, no LCL laxity and no MCL laxity. Tenderness found.  Lymphadenopathy:    She has no cervical adenopathy.  Neurological: She is alert and oriented to person, place, and time. No cranial nerve deficit.  Skin: Skin is warm and dry. No rash noted. No erythema.  Psychiatric: She has a normal mood and affect. Her behavior is normal.    Vital signs in last 24 hours: _0 @  Labs:   Estimated body mass index is 35.36 kg/(m^2) as calculated from the following:   Height as of 10/10/15: 5' 6" (1.676 m).   Weight as of 10/10/15: 99.338 kg (219 lb).   Imaging Review Plain radiographs demonstrate moderate degenerative joint disease of the left knee(s). The overall alignment ismild varus. The bone quality appears to be good for age and reported activity level.  Assessment/Plan:  End stage arthritis, left knee   The patient history, physical examination, clinical judgment of the provider and imaging studies are consistent with end stage degenerative joint disease of the left knee(s) and total knee arthroplasty is deemed medically necessary. The treatment options including medical management, injection therapy arthroscopy and arthroplasty were discussed at length. The risks and benefits of total knee arthroplasty were presented and reviewed. The risks due to aseptic loosening, infection, stiffness, patella tracking problems, thromboembolic complications and other imponderables were discussed. The patient  acknowledged the explanation, agreed to proceed with the plan and consent was signed. Patient is being admitted for inpatient treatment for surgery, pain control, PT, OT, prophylactic antibiotics, VTE prophylaxis, progressive ambulation and ADL's and discharge planning. The patient is planning to be discharged to skilled nursing facility, possibly [facility] in [place], pt lives alone.

## 2015-10-31 ENCOUNTER — Other Ambulatory Visit: Payer: Self-pay | Admitting: Orthopedic Surgery

## 2015-10-31 ENCOUNTER — Ambulatory Visit: Payer: BLUE CROSS/BLUE SHIELD

## 2015-10-31 DIAGNOSIS — M7989 Other specified soft tissue disorders: Secondary | ICD-10-CM

## 2015-11-12 ENCOUNTER — Ambulatory Visit (HOSPITAL_COMMUNITY)
Admission: RE | Admit: 2015-11-12 | Discharge: 2015-11-12 | Disposition: A | Discharge status: Home / Self Care | Payer: BLUE CROSS/BLUE SHIELD | Source: Ambulatory Visit | Attending: Physician Assistant | Admitting: Physician Assistant

## 2015-11-12 ENCOUNTER — Encounter (HOSPITAL_COMMUNITY): Payer: Self-pay

## 2015-11-12 ENCOUNTER — Encounter (HOSPITAL_COMMUNITY)
Admission: RE | Admit: 2015-11-12 | Discharge: 2015-11-12 | Disposition: A | Discharge status: Home / Self Care | Payer: BLUE CROSS/BLUE SHIELD | Source: Ambulatory Visit | Attending: Orthopedic Surgery | Admitting: Orthopedic Surgery

## 2015-11-12 DIAGNOSIS — M1712 Unilateral primary osteoarthritis, left knee: Secondary | ICD-10-CM

## 2015-11-12 DIAGNOSIS — Z01812 Encounter for preprocedural laboratory examination: Secondary | ICD-10-CM | POA: Diagnosis not present

## 2015-11-12 DIAGNOSIS — I10 Essential (primary) hypertension: Secondary | ICD-10-CM | POA: Insufficient documentation

## 2015-11-12 DIAGNOSIS — Z01818 Encounter for other preprocedural examination: Secondary | ICD-10-CM | POA: Diagnosis not present

## 2015-11-12 HISTORY — DX: Anemia, unspecified: D64.9

## 2015-11-12 HISTORY — DX: Restless legs syndrome: G25.81

## 2015-11-12 HISTORY — DX: Sleep apnea, unspecified: G47.30

## 2015-11-12 LAB — URINALYSIS, ROUTINE W REFLEX MICROSCOPIC
Bilirubin Urine: NEGATIVE
GLUCOSE, UA: NEGATIVE mg/dL
Hgb urine dipstick: NEGATIVE
Ketones, ur: NEGATIVE mg/dL
LEUKOCYTES UA: NEGATIVE
NITRITE: NEGATIVE
PROTEIN: NEGATIVE mg/dL
Specific Gravity, Urine: 1.019 (ref 1.005–1.030)
pH: 7 (ref 5.0–8.0)

## 2015-11-12 LAB — CBC WITH DIFFERENTIAL/PLATELET
BASOS ABS: 0 10*3/uL (ref 0.0–0.1)
Basophils Relative: 0 %
EOS ABS: 0.3 10*3/uL (ref 0.0–0.7)
EOS PCT: 3 %
HCT: 45.7 % (ref 36.0–46.0)
Hemoglobin: 14.8 g/dL (ref 12.0–15.0)
Lymphocytes Relative: 30 %
Lymphs Abs: 2.8 10*3/uL (ref 0.7–4.0)
MCH: 31.4 pg (ref 26.0–34.0)
MCHC: 32.4 g/dL (ref 30.0–36.0)
MCV: 97 fL (ref 78.0–100.0)
Monocytes Absolute: 0.7 10*3/uL (ref 0.1–1.0)
Monocytes Relative: 7 %
Neutro Abs: 5.6 10*3/uL (ref 1.7–7.7)
Neutrophils Relative %: 60 %
PLATELETS: 217 10*3/uL (ref 150–400)
RBC: 4.71 MIL/uL (ref 3.87–5.11)
RDW: 12.8 % (ref 11.5–15.5)
WBC: 9.4 10*3/uL (ref 4.0–10.5)

## 2015-11-12 LAB — COMPREHENSIVE METABOLIC PANEL
ALT: 29 U/L (ref 14–54)
AST: 30 U/L (ref 15–41)
Albumin: 4.2 g/dL (ref 3.5–5.0)
Alkaline Phosphatase: 86 U/L (ref 38–126)
Anion gap: 11 (ref 5–15)
BUN: 15 mg/dL (ref 6–20)
CHLORIDE: 105 mmol/L (ref 101–111)
CO2: 25 mmol/L (ref 22–32)
CREATININE: 0.73 mg/dL (ref 0.44–1.00)
Calcium: 9.7 mg/dL (ref 8.9–10.3)
GFR calc non Af Amer: >60 mL/min (ref >60)
Glucose, Bld: 101 mg/dL — ABNORMAL HIGH (ref 65–99)
Potassium: 4.1 mmol/L (ref 3.5–5.1)
SODIUM: 141 mmol/L (ref 135–145)
Total Bilirubin: 0.7 mg/dL (ref 0.3–1.2)
Total Protein: 7 g/dL (ref 6.5–8.1)

## 2015-11-12 LAB — APTT: APTT: 28 s (ref 24–37)

## 2015-11-12 LAB — SURGICAL PCR SCREEN
MRSA, PCR: NEGATIVE
STAPHYLOCOCCUS AUREUS: NEGATIVE

## 2015-11-12 LAB — PROTIME-INR
INR: 1.04 (ref 0.00–1.49)
Prothrombin Time: 13.8 seconds (ref 11.6–15.2)

## 2015-11-12 NOTE — Progress Notes (Signed)
Patient denies chest pain or shortness of breath. Primary care Kathrine Haddock, NP; Cardiologist Kathlyn Sacramento. Most recent office visit noted in Epic for both providers. Echo noted in 04/2015. Reports sleep study in 04/2015 this is in epic under the media tab. Requested copy of clearances from [facility] at Dr. Rick Duff office.

## 2015-11-12 NOTE — Pre-Procedure Instructions (Signed)
Cassidy Norris  11/12/2015      Your procedure is scheduled on December 30  Report to [facility] Admitting at 5:30 A.M.  Call this number if you have problems the morning of surgery:  [phone]   Remember:  Do not eat food or drink liquids after midnight.  Take these medicines the morning of surgery with A SIP OF WATER Tylenol (if needed), Carvediolol, Exemestane, mirapex   STOP turmeric, meloxicam, B12, Vitamin D, Calcium December 23   STOP/ Do not take Aspirin, Aleve, Naproxen, Advil, Ibuprofen, Motrin, Vitamins, Herbs, or Supplements starting December 23   Do not wear jewelry, make-up or nail polish.  Do not wear lotions, powders, or perfumes.  You may wear deodorant.  Do not shave 48 hours prior to surgery.  Men may shave face and neck.  Do not bring valuables to the hospital.  [facility] is not responsible for any belongings or valuables.  Contacts, dentures or bridgework may not be worn into surgery.  Leave your suitcase in the car.  After surgery it may be brought to your room.  For patients admitted to the hospital, discharge time will be determined by your treatment team.  Patients discharged the day of surgery will not be allowed to drive home.   [facility] - Preparing for Surgery  Before surgery, you can play an important role.  Because skin is not sterile, your skin needs to be as free of germs as possible.  You can reduce the number of germs on you skin by washing with CHG (chlorahexidine gluconate) soap before surgery.  CHG is an antiseptic cleaner which kills germs and bonds with the skin to continue killing germs even after washing.  Please DO NOT use if you have an allergy to CHG or antibacterial soaps.  If your skin becomes reddened/irritated stop using the CHG and inform your nurse when you arrive at Short Stay.  Do not shave (including legs and underarms) for at least 48 hours prior to the first CHG shower.  You may shave your  face.  Please follow these instructions carefully:   1.  Shower with CHG Soap the night before surgery and the morning of Surgery.  2.  If you choose to wash your hair, wash your hair first as usual with your normal shampoo.  3.  After you shampoo, rinse your hair and body thoroughly to remove the shampoo.  4.  Use CHG as you would any other liquid soap.  You can apply CHG directly to the skin and wash gently with scrungie or a clean washcloth.  5.  Apply the CHG Soap to your body ONLY FROM THE NECK DOWN.  Do not use on open wounds or open sores.  Avoid contact with your eyes, ears, mouth and genitals (private parts).  Wash genitals (private parts) with your normal soap.  6.  Wash thoroughly, paying special attention to the area where your surgery will be performed.  7.  Thoroughly rinse your body with warm water from the neck down.  8.  DO NOT shower/wash with your normal soap after using and rinsing off the CHG Soap.  9.  Pat yourself dry with a clean towel.            10.  Wear clean pajamas.            11.  Place clean sheets on your bed the night of your first shower and do not sleep with pets.  Day of Surgery  Do not apply any lotions the morning of surgery.  Please wear clean clothes to the hospital/surgery center.  Please read over the following fact sheets that you were given. Pain Booklet, Coughing and Deep Breathing, Blood Transfusion Information and Surgical Site Infection Prevention

## 2015-11-14 ENCOUNTER — Encounter (HOSPITAL_COMMUNITY): Payer: Self-pay

## 2015-11-14 NOTE — Progress Notes (Addendum)
Pt called to add information to her history.  She stated that she had a "HOG test" at an" Imaging place behind [facility]" prior to having chemotherapy. Test was ordered by Dr. Oliva Bustard per pt.  And to report having neuropathy in bil feet with the left being worse.

## 2015-11-20 ENCOUNTER — Telehealth: Payer: Self-pay | Admitting: *Deleted

## 2015-11-20 DIAGNOSIS — C50911 Malignant neoplasm of unspecified site of right female breast: Secondary | ICD-10-CM

## 2015-11-20 DIAGNOSIS — C50912 Malignant neoplasm of unspecified site of left female breast: Principal | ICD-10-CM

## 2015-11-20 MED ORDER — EXEMESTANE 25 MG PO TABS: 25.0000 mg | ORAL_TABLET | Freq: Every day | ORAL | Status: DC

## 2015-11-20 NOTE — Telephone Encounter (Signed)
escribed

## 2015-11-21 ENCOUNTER — Telehealth: Payer: Self-pay

## 2015-11-21 MED ORDER — CHLORHEXIDINE GLUCONATE 4 % EX LIQD: 60.0000 mL | Freq: Once | CUTANEOUS | Status: DC

## 2015-11-21 MED ORDER — CEFAZOLIN SODIUM-DEXTROSE 2-3 GM-% IV SOLR
2.0000 g | INTRAVENOUS | Status: AC
  Administered 2015-11-22: 2 g via INTRAVENOUS
  Filled 2015-11-21: qty 50

## 2015-11-21 MED ORDER — TRANEXAMIC ACID 1000 MG/10ML IV SOLN
1000.0000 mg | INTRAVENOUS | Status: AC
  Administered 2015-11-22: 1000 mg via INTRAVENOUS
  Filled 2015-11-21: qty 10

## 2015-11-21 MED ORDER — SODIUM CHLORIDE 0.9 % IV SOLN: INTRAVENOUS | Status: DC

## 2015-11-21 NOTE — Telephone Encounter (Signed)
Altha Harm, a nurse case manager at [employer], called and wanted to let us know that the patient has started in a case management program. Altha Harm states that if we have any questions that we could call her at [phone].

## 2015-11-22 ENCOUNTER — Inpatient Hospital Stay (HOSPITAL_COMMUNITY)
Admission: RE | Admit: 2015-11-22 | Discharge: 2015-11-24 | DRG: 470 | Disposition: A | Discharge status: Home Health | Payer: BLUE CROSS/BLUE SHIELD | Source: Ambulatory Visit | Attending: Orthopedic Surgery | Admitting: Orthopedic Surgery

## 2015-11-22 ENCOUNTER — Inpatient Hospital Stay (HOSPITAL_COMMUNITY): Payer: BLUE CROSS/BLUE SHIELD | Admitting: Anesthesiology

## 2015-11-22 ENCOUNTER — Encounter (HOSPITAL_COMMUNITY)
Admission: RE | Disposition: A | Discharge status: Home Health | Payer: Self-pay | Source: Ambulatory Visit | Attending: Orthopedic Surgery

## 2015-11-22 ENCOUNTER — Encounter (HOSPITAL_COMMUNITY): Payer: Self-pay | Admitting: *Deleted

## 2015-11-22 DIAGNOSIS — G4730 Sleep apnea, unspecified: Secondary | ICD-10-CM | POA: Diagnosis present

## 2015-11-22 DIAGNOSIS — E669 Obesity, unspecified: Secondary | ICD-10-CM | POA: Diagnosis present

## 2015-11-22 DIAGNOSIS — G620 Drug-induced polyneuropathy: Secondary | ICD-10-CM | POA: Diagnosis present

## 2015-11-22 DIAGNOSIS — Z87891 Personal history of nicotine dependence: Secondary | ICD-10-CM

## 2015-11-22 DIAGNOSIS — Z800 Family history of malignant neoplasm of digestive organs: Secondary | ICD-10-CM | POA: Diagnosis not present

## 2015-11-22 DIAGNOSIS — M179 Osteoarthritis of knee, unspecified: Secondary | ICD-10-CM | POA: Diagnosis present

## 2015-11-22 DIAGNOSIS — T451X5A Adverse effect of antineoplastic and immunosuppressive drugs, initial encounter: Secondary | ICD-10-CM | POA: Diagnosis present

## 2015-11-22 DIAGNOSIS — C50911 Malignant neoplasm of unspecified site of right female breast: Secondary | ICD-10-CM | POA: Diagnosis present

## 2015-11-22 DIAGNOSIS — M1712 Unilateral primary osteoarthritis, left knee: Secondary | ICD-10-CM | POA: Diagnosis present

## 2015-11-22 DIAGNOSIS — C50912 Malignant neoplasm of unspecified site of left female breast: Secondary | ICD-10-CM | POA: Diagnosis present

## 2015-11-22 DIAGNOSIS — G35 Multiple sclerosis: Secondary | ICD-10-CM | POA: Diagnosis present

## 2015-11-22 DIAGNOSIS — M25562 Pain in left knee: Secondary | ICD-10-CM | POA: Diagnosis present

## 2015-11-22 DIAGNOSIS — G2581 Restless legs syndrome: Secondary | ICD-10-CM | POA: Diagnosis present

## 2015-11-22 DIAGNOSIS — M1710 Unilateral primary osteoarthritis, unspecified knee: Secondary | ICD-10-CM | POA: Diagnosis present

## 2015-11-22 HISTORY — DX: Polyneuropathy, unspecified: G62.9

## 2015-11-22 HISTORY — DX: Myoneural disorder, unspecified: G70.9

## 2015-11-22 HISTORY — PX: TOTAL KNEE ARTHROPLASTY: SHX125

## 2015-11-22 LAB — TYPE AND SCREEN
ABO/RH(D): O POS
Antibody Screen: POSITIVE

## 2015-11-22 SURGERY — ARTHROPLASTY, KNEE, TOTAL
Anesthesia: General | Site: Knee | Laterality: Left

## 2015-11-22 MED ORDER — LACTATED RINGERS IV SOLN
INTRAVENOUS | Status: DC
  Administered 2015-11-22 (×3): via INTRAVENOUS

## 2015-11-22 MED ORDER — CEFAZOLIN SODIUM 1-5 GM-% IV SOLN
1.0000 g | Freq: Four times a day (QID) | INTRAVENOUS | Status: AC
  Administered 2015-11-22 (×2): 1 g via INTRAVENOUS
  Filled 2015-11-22 (×2): qty 50

## 2015-11-22 MED ORDER — APIXABAN 2.5 MG PO TABS: 2.5000 mg | ORAL_TABLET | Freq: Two times a day (BID) | ORAL | Status: DC

## 2015-11-22 MED ORDER — FENTANYL CITRATE (PF) 100 MCG/2ML IJ SOLN
INTRAMUSCULAR | Status: DC | PRN
  Administered 2015-11-22 (×2): 25 ug via INTRAVENOUS
  Administered 2015-11-22: 50 ug via INTRAVENOUS
  Administered 2015-11-22: 25 ug via INTRAVENOUS
  Administered 2015-11-22: 50 ug via INTRAVENOUS
  Administered 2015-11-22: 25 ug via INTRAVENOUS
  Administered 2015-11-22: 50 ug via INTRAVENOUS
  Administered 2015-11-22 (×2): 100 ug via INTRAVENOUS
  Administered 2015-11-22: 50 ug via INTRAVENOUS

## 2015-11-22 MED ORDER — ONDANSETRON HCL 4 MG/2ML IJ SOLN
INTRAMUSCULAR | Status: AC
  Filled 2015-11-22: qty 2

## 2015-11-22 MED ORDER — LIDOCAINE HCL (CARDIAC) 20 MG/ML IV SOLN
INTRAVENOUS | Status: AC
  Filled 2015-11-22: qty 5

## 2015-11-22 MED ORDER — FLEET ENEMA 7-19 GM/118ML RE ENEM: 1.0000 | ENEMA | Freq: Once | RECTAL | Status: DC | PRN

## 2015-11-22 MED ORDER — VITAMIN D 1000 UNITS PO TABS
4000.0000 [IU] | ORAL_TABLET | Freq: Every day | ORAL | Status: DC
  Administered 2015-11-23 – 2015-11-24 (×2): 4000 [IU] via ORAL
  Filled 2015-11-22 (×2): qty 4

## 2015-11-22 MED ORDER — DIPHENHYDRAMINE HCL (SLEEP) 25 MG PO TABS: 50.0000 mg | ORAL_TABLET | Freq: Every day | ORAL | Status: DC

## 2015-11-22 MED ORDER — PROPOFOL 10 MG/ML IV BOLUS
INTRAVENOUS | Status: AC
  Filled 2015-11-22: qty 20

## 2015-11-22 MED ORDER — EXEMESTANE 25 MG PO TABS
25.0000 mg | ORAL_TABLET | Freq: Every day | ORAL | Status: DC
  Administered 2015-11-23 – 2015-11-24 (×2): 25 mg via ORAL
  Filled 2015-11-22 (×3): qty 1

## 2015-11-22 MED ORDER — LIDOCAINE-EPINEPHRINE (PF) 1.5 %-1:200000 IJ SOLN
INTRAMUSCULAR | Status: DC | PRN
  Administered 2015-11-22: 15 mL via PERINEURAL

## 2015-11-22 MED ORDER — HYDROMORPHONE HCL 1 MG/ML IJ SOLN
0.2500 mg | INTRAMUSCULAR | Status: DC | PRN
  Administered 2015-11-22 (×4): 0.5 mg via INTRAVENOUS

## 2015-11-22 MED ORDER — METOCLOPRAMIDE HCL 5 MG PO TABS: 5.0000 mg | ORAL_TABLET | Freq: Three times a day (TID) | ORAL | Status: DC | PRN

## 2015-11-22 MED ORDER — MIDAZOLAM HCL 2 MG/2ML IJ SOLN
INTRAMUSCULAR | Status: AC
  Administered 2015-11-22: 2 mg
  Filled 2015-11-22: qty 2

## 2015-11-22 MED ORDER — PRAMIPEXOLE DIHYDROCHLORIDE 0.125 MG PO TABS
0.5000 mg | ORAL_TABLET | Freq: Every day | ORAL | Status: DC
  Administered 2015-11-22 – 2015-11-23 (×2): 0.5 mg via ORAL
  Filled 2015-11-22 (×2): qty 4

## 2015-11-22 MED ORDER — ACETAMINOPHEN 650 MG RE SUPP: 650.0000 mg | Freq: Four times a day (QID) | RECTAL | Status: DC | PRN

## 2015-11-22 MED ORDER — ACETAMINOPHEN 325 MG PO TABS: 650.0000 mg | ORAL_TABLET | Freq: Four times a day (QID) | ORAL | Status: DC | PRN

## 2015-11-22 MED ORDER — ESMOLOL HCL 100 MG/10ML IV SOLN
INTRAVENOUS | Status: DC | PRN
  Administered 2015-11-22: 10 mg via INTRAVENOUS

## 2015-11-22 MED ORDER — SENNOSIDES-DOCUSATE SODIUM 8.6-50 MG PO TABS: 1.0000 | ORAL_TABLET | Freq: Every evening | ORAL | Status: DC | PRN

## 2015-11-22 MED ORDER — ROCURONIUM BROMIDE 50 MG/5ML IV SOLN
INTRAVENOUS | Status: AC
  Filled 2015-11-22: qty 1

## 2015-11-22 MED ORDER — MENTHOL 3 MG MT LOZG
1.0000 | LOZENGE | OROMUCOSAL | Status: DC | PRN
  Administered 2015-11-23: 3 mg via ORAL
  Filled 2015-11-22 (×3): qty 9

## 2015-11-22 MED ORDER — HYDROMORPHONE HCL 1 MG/ML IJ SOLN
1.0000 mg | INTRAMUSCULAR | Status: DC | PRN
  Administered 2015-11-22 – 2015-11-23 (×4): 1 mg via INTRAVENOUS
  Filled 2015-11-22 (×4): qty 1

## 2015-11-22 MED ORDER — FENTANYL CITRATE (PF) 250 MCG/5ML IJ SOLN
INTRAMUSCULAR | Status: AC
  Filled 2015-11-22: qty 5

## 2015-11-22 MED ORDER — PHENYLEPHRINE 40 MCG/ML (10ML) SYRINGE FOR IV PUSH (FOR BLOOD PRESSURE SUPPORT)
PREFILLED_SYRINGE | INTRAVENOUS | Status: AC
  Filled 2015-11-22: qty 10

## 2015-11-22 MED ORDER — BUPIVACAINE-EPINEPHRINE (PF) 0.5% -1:200000 IJ SOLN
INTRAMUSCULAR | Status: AC
  Filled 2015-11-22: qty 30

## 2015-11-22 MED ORDER — PROMETHAZINE HCL 25 MG/ML IJ SOLN: 6.2500 mg | INTRAMUSCULAR | Status: DC | PRN

## 2015-11-22 MED ORDER — METOCLOPRAMIDE HCL 5 MG/ML IJ SOLN: 5.0000 mg | Freq: Three times a day (TID) | INTRAMUSCULAR | Status: DC | PRN

## 2015-11-22 MED ORDER — BISACODYL 10 MG RE SUPP: 10.0000 mg | Freq: Every day | RECTAL | Status: DC | PRN

## 2015-11-22 MED ORDER — ONDANSETRON HCL 4 MG/2ML IJ SOLN: 4.0000 mg | Freq: Four times a day (QID) | INTRAMUSCULAR | Status: DC | PRN

## 2015-11-22 MED ORDER — SUCCINYLCHOLINE CHLORIDE 20 MG/ML IJ SOLN
INTRAMUSCULAR | Status: AC
  Filled 2015-11-22: qty 2

## 2015-11-22 MED ORDER — FLUTICASONE PROPIONATE 50 MCG/ACT NA SUSP
2.0000 | Freq: Every day | NASAL | Status: DC
  Administered 2015-11-22: 2 via NASAL
  Filled 2015-11-22: qty 16

## 2015-11-22 MED ORDER — SODIUM CHLORIDE 0.9 % IV SOLN
INTRAVENOUS | Status: DC
  Administered 2015-11-22: 16:00:00 via INTRAVENOUS

## 2015-11-22 MED ORDER — BUPIVACAINE HCL (PF) 0.5 % IJ SOLN
INTRAMUSCULAR | Status: DC | PRN
  Administered 2015-11-22: 25 mL via PERINEURAL

## 2015-11-22 MED ORDER — ONDANSETRON HCL 4 MG PO TABS: 4.0000 mg | ORAL_TABLET | Freq: Four times a day (QID) | ORAL | Status: DC | PRN

## 2015-11-22 MED ORDER — FENTANYL CITRATE (PF) 100 MCG/2ML IJ SOLN
INTRAMUSCULAR | Status: AC
  Administered 2015-11-22: 50 ug
  Filled 2015-11-22: qty 2

## 2015-11-22 MED ORDER — OXYCODONE-ACETAMINOPHEN 5-325 MG PO TABS: 1.0000 | ORAL_TABLET | ORAL | Status: DC | PRN

## 2015-11-22 MED ORDER — ALUM & MAG HYDROXIDE-SIMETH 200-200-20 MG/5ML PO SUSP: 30.0000 mL | ORAL | Status: DC | PRN

## 2015-11-22 MED ORDER — ONDANSETRON HCL 4 MG/2ML IJ SOLN
INTRAMUSCULAR | Status: DC | PRN
  Administered 2015-11-22: 4 mg via INTRAVENOUS

## 2015-11-22 MED ORDER — DEXAMETHASONE SODIUM PHOSPHATE 4 MG/ML IJ SOLN
INTRAMUSCULAR | Status: AC
  Filled 2015-11-22: qty 2

## 2015-11-22 MED ORDER — OXYCODONE HCL 5 MG PO TABS
5.0000 mg | ORAL_TABLET | ORAL | Status: DC | PRN
  Administered 2015-11-22 – 2015-11-24 (×10): 10 mg via ORAL
  Filled 2015-11-22 (×10): qty 2

## 2015-11-22 MED ORDER — B-12 5000 MCG SL SUBL: 1.0000 | SUBLINGUAL_TABLET | Freq: Every day | SUBLINGUAL | Status: DC

## 2015-11-22 MED ORDER — CARVEDILOL 3.125 MG PO TABS
3.1250 mg | ORAL_TABLET | Freq: Two times a day (BID) | ORAL | Status: DC
  Administered 2015-11-22 – 2015-11-24 (×4): 3.125 mg via ORAL
  Filled 2015-11-22 (×5): qty 1

## 2015-11-22 MED ORDER — HYDROMORPHONE HCL 1 MG/ML IJ SOLN
INTRAMUSCULAR | Status: AC
  Filled 2015-11-22: qty 1

## 2015-11-22 MED ORDER — DOCUSATE SODIUM 100 MG PO CAPS
100.0000 mg | ORAL_CAPSULE | Freq: Two times a day (BID) | ORAL | Status: DC
  Administered 2015-11-22 – 2015-11-24 (×4): 100 mg via ORAL
  Filled 2015-11-22 (×4): qty 1

## 2015-11-22 MED ORDER — APIXABAN 2.5 MG PO TABS
2.5000 mg | ORAL_TABLET | Freq: Two times a day (BID) | ORAL | Status: DC
  Administered 2015-11-23 – 2015-11-24 (×3): 2.5 mg via ORAL
  Filled 2015-11-22 (×3): qty 1

## 2015-11-22 MED ORDER — SODIUM CHLORIDE 0.9 % IR SOLN
Status: DC | PRN
  Administered 2015-11-22: 1000 mL

## 2015-11-22 MED ORDER — DIPHENHYDRAMINE HCL 12.5 MG/5ML PO ELIX: 12.5000 mg | ORAL_SOLUTION | ORAL | Status: DC | PRN

## 2015-11-22 MED ORDER — PHENOL 1.4 % MT LIQD: 1.0000 | OROMUCOSAL | Status: DC | PRN

## 2015-11-22 MED ORDER — TRAZODONE HCL 50 MG PO TABS
50.0000 mg | ORAL_TABLET | Freq: Every day | ORAL | Status: DC
  Administered 2015-11-22 – 2015-11-23 (×2): 50 mg via ORAL
  Filled 2015-11-22 (×2): qty 1

## 2015-11-22 MED ORDER — PHENYLEPHRINE 40 MCG/ML (10ML) SYRINGE FOR IV PUSH (FOR BLOOD PRESSURE SUPPORT)
PREFILLED_SYRINGE | INTRAVENOUS | Status: AC
  Filled 2015-11-22: qty 30

## 2015-11-22 MED ORDER — DEXAMETHASONE SODIUM PHOSPHATE 4 MG/ML IJ SOLN
INTRAMUSCULAR | Status: DC | PRN
  Administered 2015-11-22: 8 mg via INTRAVENOUS

## 2015-11-22 MED ORDER — PHENYLEPHRINE HCL 10 MG/ML IJ SOLN
INTRAMUSCULAR | Status: DC | PRN
  Administered 2015-11-22: 120 ug via INTRAVENOUS
  Administered 2015-11-22: 40 ug via INTRAVENOUS
  Administered 2015-11-22 (×5): 80 ug via INTRAVENOUS
  Administered 2015-11-22: 40 ug via INTRAVENOUS
  Administered 2015-11-22 (×3): 80 ug via INTRAVENOUS

## 2015-11-22 SURGICAL SUPPLY — 70 items
BANDAGE ELASTIC 4 VELCRO ST LF (GAUZE/BANDAGES/DRESSINGS) IMPLANT
BANDAGE ELASTIC 6 VELCRO ST LF (GAUZE/BANDAGES/DRESSINGS) ×6 IMPLANT
BANDAGE ESMARK 6X9 LF (GAUZE/BANDAGES/DRESSINGS) ×1 IMPLANT
BLADE SAGITTAL 25.0X1.19X90 (BLADE) ×2 IMPLANT
BLADE SAGITTAL 25.0X1.19X90MM (BLADE) ×1
BLADE SAW SAG 90X13X1.27 (BLADE) ×3 IMPLANT
BLADE SURG 15 STRL LF DISP TIS (BLADE) ×1 IMPLANT
BLADE SURG 15 STRL SS (BLADE) ×2
BNDG ESMARK 6X9 LF (GAUZE/BANDAGES/DRESSINGS) ×3
BOWL SMART MIX CTS (DISPOSABLE) ×3 IMPLANT
CAP KNEE TOTAL 3 SIGMA ×3 IMPLANT
CEMENT HV SMART SET (Cement) ×6 IMPLANT
COVER SURGICAL LIGHT HANDLE (MISCELLANEOUS) ×3 IMPLANT
CUFF TOURNIQUET SINGLE 34IN LL (TOURNIQUET CUFF) ×3 IMPLANT
CUFF TOURNIQUET SINGLE 44IN (TOURNIQUET CUFF) IMPLANT
DRAPE INCISE IOBAN 66X45 STRL (DRAPES) IMPLANT
DRAPE ORTHO SPLIT 77X108 STRL (DRAPES) ×4
DRAPE SURG ORHT 6 SPLT 77X108 (DRAPES) ×2 IMPLANT
DRAPE U-SHAPE 47X51 STRL (DRAPES) ×3 IMPLANT
DRSG ADAPTIC 3X8 NADH LF (GAUZE/BANDAGES/DRESSINGS) ×3 IMPLANT
DRSG PAD ABDOMINAL 8X10 ST (GAUZE/BANDAGES/DRESSINGS) ×6 IMPLANT
DURAPREP 26ML APPLICATOR (WOUND CARE) ×3 IMPLANT
ELECT REM PT RETURN 9FT ADLT (ELECTROSURGICAL) ×3
ELECTRODE REM PT RTRN 9FT ADLT (ELECTROSURGICAL) ×1 IMPLANT
EVACUATOR 1/8 PVC DRAIN (DRAIN) ×3 IMPLANT
FACESHIELD WRAPAROUND (MASK) ×6 IMPLANT
FLOSEAL 10ML (HEMOSTASIS) IMPLANT
GAUZE SPONGE 4X4 12PLY STRL (GAUZE/BANDAGES/DRESSINGS) ×3 IMPLANT
GLOVE BIO SURGEON STRL SZ 6.5 (GLOVE) ×6 IMPLANT
GLOVE BIO SURGEONS STRL SZ 6.5 (GLOVE) ×3
GLOVE BIOGEL PI IND STRL 6.5 (GLOVE) ×4 IMPLANT
GLOVE BIOGEL PI IND STRL 8 (GLOVE) ×4 IMPLANT
GLOVE BIOGEL PI INDICATOR 6.5 (GLOVE) ×8
GLOVE BIOGEL PI INDICATOR 8 (GLOVE) ×8
GLOVE ORTHO TXT STRL SZ7.5 (GLOVE) ×3 IMPLANT
GLOVE SURG ORTHO 8.0 STRL STRW (GLOVE) ×3 IMPLANT
GOWN STRL REUS W/ TWL LRG LVL3 (GOWN DISPOSABLE) ×3 IMPLANT
GOWN STRL REUS W/ TWL XL LVL3 (GOWN DISPOSABLE) ×2 IMPLANT
GOWN STRL REUS W/TWL 2XL LVL3 (GOWN DISPOSABLE) ×3 IMPLANT
GOWN STRL REUS W/TWL LRG LVL3 (GOWN DISPOSABLE) ×6
GOWN STRL REUS W/TWL XL LVL3 (GOWN DISPOSABLE) ×4
HANDPIECE INTERPULSE COAX TIP (DISPOSABLE) ×2
HOOD PEEL AWAY FACE SHEILD DIS (HOOD) ×3 IMPLANT
IMMOBILIZER KNEE 22 (SOFTGOODS) ×3 IMPLANT
IMMOBILIZER KNEE 22 UNIV (SOFTGOODS) IMPLANT
KIT BASIN OR (CUSTOM PROCEDURE TRAY) ×3 IMPLANT
KIT ROOM TURNOVER OR (KITS) ×3 IMPLANT
MANIFOLD NEPTUNE II (INSTRUMENTS) ×3 IMPLANT
NEEDLE 22X1 1/2 (OR ONLY) (NEEDLE) IMPLANT
NS IRRIG 1000ML POUR BTL (IV SOLUTION) ×3 IMPLANT
PACK TOTAL JOINT (CUSTOM PROCEDURE TRAY) ×3 IMPLANT
PACK UNIVERSAL I (CUSTOM PROCEDURE TRAY) ×3 IMPLANT
PAD ARMBOARD 7.5X6 YLW CONV (MISCELLANEOUS) ×6 IMPLANT
PAD CAST 4YDX4 CTTN HI CHSV (CAST SUPPLIES) IMPLANT
PADDING CAST COTTON 4X4 STRL (CAST SUPPLIES)
PADDING CAST COTTON 6X4 STRL (CAST SUPPLIES) ×6 IMPLANT
SET HNDPC FAN SPRY TIP SCT (DISPOSABLE) ×1 IMPLANT
SPONGE GAUZE 4X4 12PLY STER LF (GAUZE/BANDAGES/DRESSINGS) ×3 IMPLANT
STAPLER VISISTAT 35W (STAPLE) ×3 IMPLANT
SUCTION FRAZIER TIP 10 FR DISP (SUCTIONS) ×3 IMPLANT
SUT ETHIBOND NAB CT1 #1 30IN (SUTURE) ×9 IMPLANT
SUT VIC AB 0 CT1 27 (SUTURE) ×2
SUT VIC AB 0 CT1 27XBRD ANBCTR (SUTURE) ×1 IMPLANT
SUT VIC AB 2-0 CT1 27 (SUTURE) ×4
SUT VIC AB 2-0 CT1 TAPERPNT 27 (SUTURE) ×2 IMPLANT
SYR CONTROL 10ML LL (SYRINGE) IMPLANT
TOWEL OR 17X24 6PK STRL BLUE (TOWEL DISPOSABLE) ×3 IMPLANT
TOWEL OR 17X26 10 PK STRL BLUE (TOWEL DISPOSABLE) ×3 IMPLANT
TRAY FOLEY CATH 16FRSI W/METER (SET/KITS/TRAYS/PACK) IMPLANT
WATER STERILE IRR 1000ML POUR (IV SOLUTION) ×3 IMPLANT

## 2015-11-22 NOTE — H&P (View-Only) (Signed)
TOTAL KNEE ADMISSION H&P  Patient is being admitted for left total knee arthroplasty.  Subjective:  Chief Complaint:left knee pain.  HPI: Cassidy Norris, 64 y.o. female, has a history of pain and functional disability in the left knee due to arthritis and has failed non-surgical conservative treatments for greater than 12 weeks to includeNSAID's and/or analgesics, corticosteriod injections, viscosupplementation injections, use of assistive devices and activity modification.  Onset of symptoms was gradual, starting 7 years ago with gradually worsening course since that time. The patient noted no past surgery on the left knee(s).  Patient currently rates pain in the left knee(s) at 10 out of 10 with activity. Patient has night pain, worsening of pain with activity and weight bearing, pain that interferes with activities of daily living, pain with passive range of motion, crepitus and joint swelling.  Patient has evidence of periarticular osteophytes and joint space narrowing by imaging studies.There is no active infection.  Patient Active Problem List   Diagnosis Date Noted  . History of colon polyps 10/10/2015  . Family history of colon cancer 10/10/2015  . Bilateral malignant neoplasm of breast in female [facility]) 10/10/2015  . IFG (impaired fasting glucose) 09/23/2015  . RLS (restless legs syndrome) 07/22/2015  . Insomnia 07/22/2015  . Tachycardia 04/19/2015  . Peripheral neuropathy due to chemotherapy ([place]) 03/19/2015  . Lumbar radiculopathy 03/19/2015  . Breast cancer bilateral, multifocal ER/PR pos, Her 2 neg,. 11/29/2013  . Multiple sclerosis ([place])   . Family history of malignant neoplasm of gastrointestinal tract    Past Medical History  Diagnosis Date  . Personal history of tobacco use, presenting hazards to health   . Multiple sclerosis ([place])   . Diffuse cystic mastopathy   . Family history of malignant neoplasm of gastrointestinal tract 2012  . Obesity, unspecified   . Special  screening for malignant neoplasms, colon   . Cancer [facility]) 2014    right breast - Invasive Mammary Carcinoma  . Cancer [facility]) 2014    left breast - Invasive lobular carcinoma and DCIS   . Fractured elbow 08/28/14  . Hx of metabolic acidosis with increased anion gap   . Increased heart rate     Past Surgical History  Procedure Laterality Date  . Wisdom tooth extraction  2006  . Polypectomy  1989  . Dilation and curettage of uterus  1983  . Colonoscopy  2010    Dr. Jamal Collin  . Tubal ligation    . Portacath placement  2015  . Spine surgery  09/14/14  . Breast biopsy Right 1973,2001  . Breast biopsy Right 11-07-13    INVASIVE MAMMARY CARCINOMA , ER/PR positive Her2 negative  . Breast biopsy Left 11-27-13    invasive lobular and DCIS  . Breast surgery Bilateral 01/09/14    mastectomy  . Colonoscopy with propofol N/A 06/11/2015    Procedure: COLONOSCOPY WITH PROPOFOL;  Surgeon: Christene Lye, MD;  Location: [facility] ENDOSCOPY;  Service: Endoscopy;  Laterality: N/A;     (Not in a hospital admission) No Known Allergies  Social History  Substance Use Topics  . Smoking status: Former Smoker -- 1.00 packs/day for 30 years    Types: Cigarettes    Quit date: 12/11/2013  . Smokeless tobacco: Never Used  . Alcohol Use: No     Comment: social    Family History  Problem Relation Age of Onset  . Cancer Mother     lung  . Cancer Father     colon, stomach  . Diabetes  Maternal Grandmother   . Rectal cancer Paternal Uncle   . Rectal cancer Paternal Uncle   . Hypertension Sister      Review of Systems  Cardiovascular: Positive for leg swelling.  Musculoskeletal: Positive for joint pain.  Skin: Positive for rash.  All other systems reviewed and are negative.   Objective:  Physical Exam  Constitutional: She is oriented to person, place, and time. She appears well-developed and well-nourished. No distress.  HENT:  Head: Normocephalic and atraumatic.  Nose: Nose normal.  Eyes:  Conjunctivae and EOM are normal. Pupils are equal, round, and reactive to light.  Neck: Normal range of motion. Neck supple.  Cardiovascular: Regular rhythm, normal heart sounds and intact distal pulses.  Tachycardia present.   Respiratory: Effort normal and breath sounds normal. No respiratory distress. She has no wheezes.  GI: Soft. Bowel sounds are normal. She exhibits no distension. There is no tenderness.  Musculoskeletal:       Left knee: She exhibits swelling. She exhibits no erythema, no LCL laxity and no MCL laxity. Tenderness found.  Lymphadenopathy:    She has no cervical adenopathy.  Neurological: She is alert and oriented to person, place, and time. No cranial nerve deficit.  Skin: Skin is warm and dry. No rash noted. No erythema.  Psychiatric: She has a normal mood and affect. Her behavior is normal.    Vital signs in last 24 hours: _0 @  Labs:   Estimated body mass index is 35.36 kg/(m^2) as calculated from the following:   Height as of 10/10/15: 5' 6" (1.676 m).   Weight as of 10/10/15: 99.338 kg (219 lb).   Imaging Review Plain radiographs demonstrate moderate degenerative joint disease of the left knee(s). The overall alignment ismild varus. The bone quality appears to be good for age and reported activity level.  Assessment/Plan:  End stage arthritis, left knee   The patient history, physical examination, clinical judgment of the provider and imaging studies are consistent with end stage degenerative joint disease of the left knee(s) and total knee arthroplasty is deemed medically necessary. The treatment options including medical management, injection therapy arthroscopy and arthroplasty were discussed at length. The risks and benefits of total knee arthroplasty were presented and reviewed. The risks due to aseptic loosening, infection, stiffness, patella tracking problems, thromboembolic complications and other imponderables were discussed. The patient  acknowledged the explanation, agreed to proceed with the plan and consent was signed. Patient is being admitted for inpatient treatment for surgery, pain control, PT, OT, prophylactic antibiotics, VTE prophylaxis, progressive ambulation and ADL's and discharge planning. The patient is planning to be discharged to skilled nursing facility, possibly [facility] in [place], pt lives alone.

## 2015-11-22 NOTE — Interval H&P Note (Signed)
History and Physical Interval Note:  11/22/2015 7:24 AM  Cassidy Norris  has presented today for surgery, with the diagnosis of OA LEFT KNEE  The various methods of treatment have been discussed with the patient and family. After consideration of risks, benefits and other options for treatment, the patient has consented to  Procedure(s): TOTAL KNEE ARTHROPLASTY (Left) as a surgical intervention .  The patient's history has been reviewed, patient examined, no change in status, stable for surgery.  I have reviewed the patient's chart and labs.  Questions were answered to the patient's satisfaction.     Akeiba Axelson JR,W D

## 2015-11-22 NOTE — Transfer of Care (Signed)
Immediate Anesthesia Transfer of Care Note  Patient: Cassidy Norris  Procedure(s) Performed: Procedure(s): TOTAL KNEE ARTHROPLASTY (Left)  Patient Location: PACU  Anesthesia Type:General  Level of Consciousness: awake, alert , oriented and patient cooperative  Airway & Oxygen Therapy: Patient Spontanous Breathing and Patient connected to nasal cannula oxygen  Post-op Assessment: Report given to RN and Post -op Vital signs reviewed and stable  Post vital signs: Reviewed and stable  Last Vitals:  Filed Vitals:   11/22/15 0750  BP: 141/77  Pulse: 104  Temp: 36.5 C  Resp: 18    Complications: No apparent anesthesia complications

## 2015-11-22 NOTE — Evaluation (Signed)
Physical Therapy Evaluation Patient Details Name: Cassidy Norris MRN: [id number] DOB: 1951/06/23 Today's Date: 11/22/2015   History of Present Illness  64 y.o. female s/p left total knee arthroplasty. Hx of cancer, peripheral neuropathy due to chemotherapy, and multiple sclerosis.  Clinical Impression  Pt is s/p left total knee arthroplasty, presenting to physical therapy with the deficits listed below (see PT Problem List). Minimal but present quad activation on Lt with notable buckling despite knee immobilizer, requiring external block and support from PT during transfer training. Pt very motivated. I anticipate she will progress well but will likely benefit from ST-SNF prior to returning home as she lives alone and has limited family support available. Educated on therapeutic exercises, precautions, and use of 'zero knee' for positioning to promote optimal healing. Pt will benefit from skilled PT to increase their independence and safety with mobility to allow discharge to the venue listed below.      Follow Up Recommendations SNF (likely need SNF but will update recs pending progression)    Equipment Recommendations  Rolling walker with 5" wheels    Recommendations for Other Services       Precautions / Restrictions Precautions Precautions: Fall;Knee Precaution Booklet Issued: Yes (comment) Precaution Comments: reviewed handout Required Braces or Orthoses: Knee Immobilizer - Left Restrictions Weight Bearing Restrictions: Yes LLE Weight Bearing: Weight bearing as tolerated      Mobility  Bed Mobility Overal bed mobility: Needs Assistance Bed Mobility: Supine to Sit     Supine to sit: Min guard;HOB elevated     General bed mobility comments: Min guard for safety. Cues for use of rail. No physical assist needed to scoot to EOB, KI in place.  Transfers Overall transfer level: Needs assistance Equipment used: Rolling walker (2 wheeled) Transfers: Sit to/from  [facility] Sit to Stand: Mod assist Stand pivot transfers: Min assist       General transfer comment: Mod assist for boost to stand. Reports difficulty with RT knee at baseline. VC for hand placement and anterior weight shift. Min assist for LT knee brace despite knee immobilizer in place. Frequent verbal and tactile cues for knee extension for Lt quad activation while weight-bearing. Does demonstrate buckling with minimal Lt knee control this date. Small pivotal steps to chair. Lightheaded and needed to sit.  Ambulation/Gait             General Gait Details: held due to lightheadedness with transfer training.  Stairs            Wheelchair Mobility    Modified Rankin (Stroke Patients Only)       Balance Overall balance assessment: Needs assistance Sitting-balance support: No upper extremity supported;Feet supported Sitting balance-Leahy Scale: Good     Standing balance support: Single extremity supported Standing balance-Leahy Scale: Poor                               Pertinent Vitals/Pain Pain Assessment: 0-10 Pain Score: 3  Pain Location: Lt knee Pain Descriptors / Indicators: Aching Pain Intervention(s): Monitored during session;Repositioned    Home Living Family/patient expects to be discharged to:: Skilled nursing facility Living Arrangements: Alone Available Help at Discharge: Family;Available PRN/intermittently (sister) Type of Home: House Home Access: Stairs to enter Entrance Stairs-Rails: Left ("shelf") Entrance Stairs-Number of Steps: 3 Home Layout: One level Home Equipment: Shower seat - built in;Cane - single point      Prior Function Level of Independence: Independent  Comments: no device     Hand Dominance   Dominant Hand: Right    Extremity/Trunk Assessment   Upper Extremity Assessment: Defer to OT evaluation           Lower Extremity Assessment: LLE deficits/detail   LLE Deficits /  Details: extensor lag >25%. able to activate quad. feels toes with light touch     Communication   Communication: No difficulties  Cognition Arousal/Alertness: Awake/alert Behavior During Therapy: WFL for tasks assessed/performed Overall Cognitive Status: Within Functional Limits for tasks assessed                      General Comments General comments (skin integrity, edema, etc.): Educated on positioning for optimal healing of Lt knee.  Educated on use of insentive spirometer.    Exercises Total Joint Exercises Ankle Circles/Pumps: AROM;Both;10 reps;Seated Quad Sets: Strengthening;Both;10 reps;Supine Gluteal Sets: Strengthening;Both;10 reps;Seated Long Arc Quad: AAROM;Left;10 reps;Seated      Assessment/Plan    PT Assessment Patient needs continued PT services  PT Diagnosis Difficulty walking;Acute pain   PT Problem List Decreased strength;Decreased range of motion;Decreased activity tolerance;Decreased balance;Decreased mobility;Decreased knowledge of use of DME;Decreased knowledge of precautions;Pain  PT Treatment Interventions DME instruction;Gait training;Stair training;Functional mobility training;Therapeutic activities;Therapeutic exercise;Balance training;Neuromuscular re-education;Patient/family education;Modalities   PT Goals (Current goals can be found in the Care Plan section) Acute Rehab PT Goals Patient Stated Goal: Be able to get around better PT Goal Formulation: With patient Time For Goal Achievement: 11/29/15 Potential to Achieve Goals: Good    Frequency 7X/week   Barriers to discharge Decreased caregiver support;Inaccessible home environment lives alone, limited assist available. 3 steps to climb into house.    Co-evaluation               End of Session Equipment Utilized During Treatment: Gait belt;Oxygen Activity Tolerance: Patient tolerated treatment well Patient left: in chair;with call bell/phone within reach;with SCD's reapplied  (zero knee on) Nurse Communication: Mobility status;Precautions         Time: [id number] PT Time Calculation (min) (ACUTE ONLY): 33 min   Charges:   PT Evaluation $Initial PT Evaluation Tier I: 1 Procedure PT Treatments $Therapeutic Activity: 8-22 mins   PT G CodesEllouise Newer 11/22/2015, 5:41 PM Camille Bal [place], [place]

## 2015-11-22 NOTE — Brief Op Note (Signed)
11/22/2015  1:40 PM  PATIENT:  [name]  64 y.o. female  PRE-OPERATIVE DIAGNOSIS:  OA LEFT KNEE  POST-OPERATIVE DIAGNOSIS:  osteoarthritis left knee  PROCEDURE:  Procedure(s): TOTAL KNEE ARTHROPLASTY (Left)  SURGEON:  Surgeon(s) and Role:    * Earlie Server, MD - Primary  PHYSICIAN ASSISTANT: Chriss Czar, PA-C  ASSISTANTS:OR staff x1  ANESTHESIA:   regional and general  EBL:  Total I/O In: 1000 [I.V.:1000] Out: -   BLOOD ADMINISTERED:none  DRAINS: 1 hemovac drain lateral left knee self suction   LOCAL MEDICATIONS USED:  NONE  SPECIMEN:  No Specimen  DISPOSITION OF SPECIMEN:  N/A  COUNTS:  YES  TOURNIQUET:    DICTATION: .Other Dictation: Dictation Number unknown  PLAN OF CARE: Admit to inpatient   PATIENT DISPOSITION:  PACU - hemodynamically stable.   Delay start of Pharmacological VTE agent (>24hrs) due to surgical blood loss or risk of bleeding: yes

## 2015-11-22 NOTE — Progress Notes (Signed)
Orthopedic Tech Progress Note Patient Details:  Cassidy Norris 06-26-1951 [id number]  CPM Left Knee CPM Left Knee: On Left Knee Flexion (Degrees): 90 Left Knee Extension (Degrees): 0 Additional Comments: lle Viewed order  From drs order list   Karolee Stamps 11/22/2015, 2:52 PM

## 2015-11-22 NOTE — Op Note (Signed)
NAMERAHNI, MAILLARD NO.:  [id number]  MEDICAL RECORD NO.:  [id number]  LOCATION:  5N30C                        FACILITY:  [facility]  PHYSICIAN:  Lockie Pares, M.D.    DATE OF BIRTH:  Aug 02, 1951  DATE OF PROCEDURE:  11/22/2015 DATE OF DISCHARGE:                              OPERATIVE REPORT   PREOPERATIVE DIAGNOSIS:  Severe osteoarthritis, left knee with varus deformity.  POSTOPERATIVE DIAGNOSIS:  Severe osteoarthritis, left knee with varus deformity.  OPERATION:  Left total knee replacement (Sigma cemented knee size 3 femur, tibia 10 mm bearing with 35 mm all poly patella).  ANESTHESIA:  General anesthetic with a nerve block.  ASSISTANT:  Chriss Czar, PA-C.  DATE OF OPERATION:  November 22, 2015.  DESCRIPTION OF PROCEDURE:  Exsanguination of leg inflation, tourniquet 350, straight skin incision with medial parapatellar trip knee made.  We cut 11 mm off the distal femur with a 5-degree valgus cut.  Identified the most medial compartment and made a provisional cut.     Lockie Pares, M.D.     WDC/MEDQ  D:  11/22/2015  T:  11/22/2015  Job:  [id number]

## 2015-11-22 NOTE — Progress Notes (Signed)
Utilization review completed. Norah Fick, RN, BSN. 

## 2015-11-22 NOTE — Anesthesia Procedure Notes (Signed)
Anesthesia Regional Block:  Femoral nerve block  Pre-Anesthetic Checklist: ,, timeout performed, Correct Patient, Correct Site, Correct Laterality, Correct Procedure, Correct Position, site marked, Risks and benefits discussed,  Surgical consent,  Pre-op evaluation,  At surgeon's request and post-op pain management  Laterality: Left  Prep: chloraprep       Needles:  Injection technique: Single-shot  Needle Type: Echogenic Stimulator Needle     Needle Length: 9cm 9 cm Needle Gauge: 21 and 21 G    Additional Needles:  Procedures: ultrasound guided (picture in chart) Femoral nerve block Narrative:  Injection made incrementally with aspirations every 5 mL.  Performed by: Personally   Additional Notes: Patient tolerated the procedure well without complications

## 2015-11-22 NOTE — Anesthesia Preprocedure Evaluation (Signed)
Anesthesia Evaluation  Patient identified by MRN, date of birth, ID band Patient awake    Reviewed: Allergy & Precautions, NPO status , Patient's Chart, lab work & pertinent test results  Airway Mallampati: II  TM Distance: >3 FB Neck ROM: Full    Dental no notable dental hx.    Pulmonary sleep apnea , former smoker,    Pulmonary exam normal breath sounds clear to auscultation       Cardiovascular negative cardio ROS Normal cardiovascular exam Rhythm:Regular Rate:Normal     Neuro/Psych MS  Neuromuscular disease negative psych ROS   GI/Hepatic negative GI ROS, Neg liver ROS,   Endo/Other  obesity  Renal/GU negative Renal ROS  negative genitourinary   Musculoskeletal negative musculoskeletal ROS (+)   Abdominal   Peds negative pediatric ROS (+)  Hematology negative hematology ROS (+)   Anesthesia Other Findings   Reproductive/Obstetrics negative OB ROS                             Anesthesia Physical Anesthesia Plan  ASA: III  Anesthesia Plan: General   Post-op Pain Management: GA combined w/ Regional for post-op pain   Induction: Intravenous  Airway Management Planned: Oral ETT and LMA  Additional Equipment:   Intra-op Plan:   Post-operative Plan: Extubation in OR  Informed Consent: I have reviewed the patients History and Physical, chart, labs and discussed the procedure including the risks, benefits and alternatives for the proposed anesthesia with the patient or authorized representative who has indicated his/her understanding and acceptance.   Dental advisory given  Plan Discussed with: CRNA and Surgeon  Anesthesia Plan Comments:         Anesthesia Quick Evaluation

## 2015-11-22 NOTE — Anesthesia Postprocedure Evaluation (Signed)
Anesthesia Post Note  Patient: Cassidy Norris  Procedure(s) Performed: Procedure(s) (LRB): TOTAL KNEE ARTHROPLASTY (Left)  Patient location during evaluation: PACU Anesthesia Type: General and Regional Level of consciousness: awake and alert Pain management: pain level controlled Vital Signs Assessment: post-procedure vital signs reviewed and stable Respiratory status: spontaneous breathing and respiratory function stable Cardiovascular status: blood pressure returned to baseline and stable Postop Assessment: no signs of nausea or vomiting Anesthetic complications: no    Last Vitals:  Filed Vitals:   11/22/15 0750 11/22/15 1345  BP: 141/77   Pulse: 104   Temp: 36.5 C 36.8 C  Resp: 18     Last Pain:  Filed Vitals:   11/22/15 1351  PainSc: 3                  Aniceto Kyser S

## 2015-11-22 NOTE — Progress Notes (Signed)
   11/22/15 1611  Clinical Encounter Type  Visited With Patient;Health care provider  Visit Type Initial;Post-op  Referral From Nurse   Chaplain responded to a notice from the RN that patient was requesting sacred text. Chaplain checked in with the patient, and those needs have been met. Chaplain offered support, and our support is available as needed.   Jeri Lager, Chaplain 11/22/2015 4:11 PM

## 2015-11-23 HISTORY — PX: JOINT REPLACEMENT: SHX530

## 2015-11-23 LAB — BASIC METABOLIC PANEL
Anion gap: 9 (ref 5–15)
BUN: 9 mg/dL (ref 6–20)
CALCIUM: 8.7 mg/dL — AB (ref 8.9–10.3)
CO2: 27 mmol/L (ref 22–32)
CREATININE: 0.83 mg/dL (ref 0.44–1.00)
Chloride: 104 mmol/L (ref 101–111)
GFR calc Af Amer: >60 mL/min (ref >60)
Glucose, Bld: 162 mg/dL — ABNORMAL HIGH (ref 65–99)
POTASSIUM: 3.9 mmol/L (ref 3.5–5.1)
SODIUM: 140 mmol/L (ref 135–145)

## 2015-11-23 LAB — CBC
HCT: 38.5 % (ref 36.0–46.0)
Hemoglobin: 12.8 g/dL (ref 12.0–15.0)
MCH: 32.1 pg (ref 26.0–34.0)
MCHC: 33.2 g/dL (ref 30.0–36.0)
MCV: 96.5 fL (ref 78.0–100.0)
PLATELETS: 202 10*3/uL (ref 150–400)
RBC: 3.99 MIL/uL (ref 3.87–5.11)
RDW: 13.1 % (ref 11.5–15.5)
WBC: 12.9 10*3/uL — ABNORMAL HIGH (ref 4.0–10.5)

## 2015-11-23 LAB — URINE CULTURE

## 2015-11-23 MED ORDER — WHITE PETROLATUM GEL
Status: AC
  Administered 2015-11-23: 19:00:00
  Filled 2015-11-23: qty 1

## 2015-11-23 NOTE — Progress Notes (Signed)
     Subjective:  POD#1 LTK. Patient reports pain as mild.  Resting comfortably in bed this morning.  Ambulated >149ft with PT and has 90 degrees of active flexion of the knee.  Using the CPM machine at this time.  Plan was for possible discharge to rehab facility since the patient lives alone, but PT feels home with [facility] would be appropriate given great mobilization.  Will touch base with social work and have them discuss options with the patient today.   Objective:   VITALS:   Filed Vitals:   11/22/15 1458 11/22/15 1524 11/22/15 2101 11/23/15 0519  BP:  123/73 127/89 118/65  Pulse:  104 118 113  Temp: 98.7 F (37.1 C) 97.7 F (36.5 C) 97.6 F (36.4 C) 98.3 F (36.8 C)  TempSrc:  Oral Oral Oral  Resp:  15 16 16   Weight:      SpO2:  94% 95% 98%    Neurologically intact ABD soft Neurovascular intact Sensation intact distally Intact pulses distally Dorsiflexion/Plantar flexion intact Incision: dressing C/D/I   Lab Results  Component Value Date   WBC 12.9* 11/23/2015   HGB 12.8 11/23/2015   HCT 38.5 11/23/2015   MCV 96.5 11/23/2015   PLT 202 11/23/2015   BMET    Component Value Date/Time   NA 140 11/23/2015 0650   NA 142 09/23/2015 1124   NA 139 03/11/2015 1356   K 3.9 11/23/2015 0650   K 3.4* 03/11/2015 1356   CL 104 11/23/2015 0650   CL 103 03/11/2015 1356   CO2 27 11/23/2015 0650   CO2 28 03/11/2015 1356   GLUCOSE 162* 11/23/2015 0650   GLUCOSE 97 09/23/2015 1124   GLUCOSE 118* 03/11/2015 1356   BUN 9 11/23/2015 0650   BUN 13 09/23/2015 1124   BUN 13 03/11/2015 1356   CREATININE 0.83 11/23/2015 0650   CREATININE 0.69 03/11/2015 1356   CALCIUM 8.7* 11/23/2015 0650   CALCIUM 9.2 03/11/2015 1356   GFRNONAA >60 11/23/2015 0650   GFRNONAA >60 03/11/2015 1356   GFRNONAA >60 10/01/2014 1523   GFRAA >60 11/23/2015 0650   GFRAA >60 03/11/2015 1356   GFRAA >60 10/01/2014 1523     Assessment/Plan: 1 Day Post-Op   Active Problems:   Primary localized  osteoarthritis of left knee   Up with therapy WBAT in the LLE Eliquis for DVT prophylaxis Will see how the patient continues to do with PT and discuss options with social work for discharge   Gae Dry 11/23/2015, 8:53 AM Cell [phone]

## 2015-11-23 NOTE — Clinical Social Work Note (Signed)
Clinical Social Work Assessment  Patient Details  Name: Cassidy Norris MRN: [id number] Date of Birth: 12-Nov-1951  Date of referral:  11/23/15               Reason for consult:  Facility Placement                Permission sought to share information with:    Permission granted to share information::     Name::     Ardine Bjork and [place]::  SNF admissions  Relationship::     Contact Information:     Housing/Transportation Living arrangements for the past 2 months:  [place] of Information:  Patient Patient Interpreter Needed:  None Criminal Activity/Legal Involvement Pertinent to Current Situation/Hospitalization:  No - Comment as needed Significant Relationships:  Siblings Lives with:  Self Do you feel safe going back to the place where you live?  Yes (Patient feels safe to return back home with home health.) Need for family participation in patient care:  No (Coment)  Care giving concerns: Patient initially was concerned that she would need to go to SNF for short term rehab, however patient has improved significantly and feels better about returning back home with home health.   Social Worker assessment / plan:  Patient is a pleasant and alert and oriented female who lives alone.  Patient stated she has sisters who are able to help her at home and regularly check in on her.  Patient stated she was thinking about going to SNF for short term rehab, but feels more comfortable about returning home with home health.  CSW explained to patient that with the amount of distances she is walking and her age, insurance will most likely not approve her for SNF.  Patient stated she understands and is comfortable with returning back home.  CSW informed patient that if she goes home and feels like she needs more help at home, home health can assist with trying to find a place for her.  Employment status:  Therapist, music:  [employer]  Recommendations:  [place] / Referral to community resources:     Patient/Family's Response to care:  Patient in agreement to going home with home health  Patient/Family's Understanding of and Emotional Response to Diagnosis, Current Treatment, and Prognosis:  Patient aware of current treatment and prognosis.  Emotional Assessment Appearance:  Appears stated age Attitude/Demeanor/Rapport:    Affect (typically observed):  Pleasant Orientation:  Oriented to Self, Oriented to Place, Oriented to  Time, Oriented to Situation Alcohol / Substance use:  Not Applicable Psych involvement (Current and /or in the community):  No (Comment)  Discharge Needs  Concerns to be addressed:  No discharge needs identified Readmission within the last 30 days:  No Current discharge risk:  Lives alone Barriers to Discharge:  Insurance Authorization   Anell Barr 11/23/2015, 12:23 PM

## 2015-11-23 NOTE — Discharge Instructions (Signed)
INSTRUCTIONS AFTER JOINT REPLACEMENT  ° °o Remove items at home which could result in a fall. This includes throw rugs or furniture in walking pathways °o ICE to the affected joint every three hours while awake for 30 minutes at a time, for at least the first 3-5 days, and then as needed for pain and swelling.  Continue to use ice for pain and swelling. You may notice swelling that will progress down to the foot and ankle.  This is normal after surgery.  Elevate your leg when you are not up walking on it.   °o Continue to use the breathing machine you got in the hospital (incentive spirometer) which will help keep your temperature down.  It is common for your temperature to cycle up and down following surgery, especially at night when you are not up moving around and exerting yourself.  The breathing machine keeps your lungs expanded and your temperature down. ° ° °DIET:  As you were doing prior to hospitalization, we recommend a well-balanced diet. ° °DRESSING / WOUND CARE / SHOWERING ° °You may change your dressing 3-5 days after surgery.  Then change the dressing every day with sterile gauze.  Please use good hand washing techniques before changing the dressing.  Do not use any lotions or creams on the incision until instructed by your surgeon. ° °ACTIVITY ° °o Increase activity slowly as tolerated, but follow the weight bearing instructions below.   °o No driving for 6 weeks or until further direction given by your physician.  You cannot drive while taking narcotics.  °o No lifting or carrying greater than 10 lbs. until further directed by your surgeon. °o Avoid periods of inactivity such as sitting longer than an hour when not asleep. This helps prevent blood clots.  °o You may return to work once you are authorized by your doctor.  ° ° ° °WEIGHT BEARING  ° °Weight bearing as tolerated with assist device (walker, cane, etc) as directed, use it as long as suggested by your surgeon or therapist, typically at  least 4-6 weeks. ° ° °EXERCISES ° °Results after joint replacement surgery are often greatly improved when you follow the exercise, range of motion and muscle strengthening exercises prescribed by your doctor. Safety measures are also important to protect the joint from further injury. Any time any of these exercises cause you to have increased pain or swelling, decrease what you are doing until you are comfortable again and then slowly increase them. If you have problems or questions, call your caregiver or physical therapist for advice.  ° °Rehabilitation is important following a joint replacement. After just a few days of immobilization, the muscles of the leg can become weakened and shrink (atrophy).  These exercises are designed to build up the tone and strength of the thigh and leg muscles and to improve motion. Often times heat used for twenty to thirty minutes before working out will loosen up your tissues and help with improving the range of motion but do not use heat for the first two weeks following surgery (sometimes heat can increase post-operative swelling).  ° °These exercises can be done on a training (exercise) mat, on the floor, on a table or on a bed. Use whatever works the best and is most comfortable for you.    Use music or television while you are exercising so that the exercises are a pleasant break in your day. This will make your life better with the exercises acting as a break   in your routine that you can look forward to.   Perform all exercises about fifteen times, three times per day or as directed.  You should exercise both the operative leg and the other leg as well. ° °Exercises include: °  °• Quad Sets - Tighten up the muscle on the front of the thigh (Quad) and hold for 5-10 seconds.   °• Straight Leg Raises - With your knee straight (if you were given a brace, keep it on), lift the leg to 60 degrees, hold for 3 seconds, and slowly lower the leg.  Perform this exercise against  resistance later as your leg gets stronger.  °• Leg Slides: Lying on your back, slowly slide your foot toward your buttocks, bending your knee up off the floor (only go as far as is comfortable). Then slowly slide your foot back down until your leg is flat on the floor again.  °• Angel Wings: Lying on your back spread your legs to the side as far apart as you can without causing discomfort.  °• Hamstring Strength:  Lying on your back, push your heel against the floor with your leg straight by tightening up the muscles of your buttocks.  Repeat, but this time bend your knee to a comfortable angle, and push your heel against the floor.  You may put a pillow under the heel to make it more comfortable if necessary.  ° °A rehabilitation program following joint replacement surgery can speed recovery and prevent re-injury in the future due to weakened muscles. Contact your doctor or a physical therapist for more information on knee rehabilitation.  ° ° °CONSTIPATION ° °Constipation is defined medically as fewer than three stools per week and severe constipation as less than one stool per week.  Even if you have a regular bowel pattern at home, your normal regimen is likely to be disrupted due to multiple reasons following surgery.  Combination of anesthesia, postoperative narcotics, change in appetite and fluid intake all can affect your bowels.  ° °YOU MUST use at least one of the following options; they are listed in order of increasing strength to get the job done.  They are all available over the counter, and you may need to use some, POSSIBLY even all of these options:   ° °Drink plenty of fluids (prune juice may be helpful) and high fiber foods °Colace 100 mg by mouth twice a day  °Senokot for constipation as directed and as needed Dulcolax (bisacodyl), take with full glass of water  °Miralax (polyethylene glycol) once or twice a day as needed. ° °If you have tried all these things and are unable to have a bowel  movement in the first 3-4 days after surgery call either your surgeon or your primary doctor.   ° °If you experience loose stools or diarrhea, hold the medications until you stool forms back up.  If your symptoms do not get better within 1 week or if they get worse, check with your doctor.  If you experience "the worst abdominal pain ever" or develop nausea or vomiting, please contact the office immediately for further recommendations for treatment. ° ° °ITCHING:  If you experience itching with your medications, try taking only a single pain pill, or even half a pain pill at a time.  You can also use Benadryl over the counter for itching or also to help with sleep.  ° °TED HOSE STOCKINGS:  Use stockings on both legs until for at least 2 weeks or as   directed by physician office. They may be removed at night for sleeping.  MEDICATIONS:  See your medication summary on the After Visit Summary that nursing will review with you.  You may have some home medications which will be placed on hold until you complete the course of blood thinner medication.  It is important for you to complete the blood thinner medication as prescribed.  PRECAUTIONS:  If you experience chest pain or shortness of breath - call 911 immediately for transfer to the hospital emergency department.   If you develop a fever greater that 101 F, purulent drainage from wound, increased redness or drainage from wound, foul odor from the wound/dressing, or calf pain - CONTACT YOUR SURGEON.                                                   FOLLOW-UP APPOINTMENTS:  If you do not already have a post-op appointment, please call the office for an appointment to be seen by your surgeon.  Guidelines for how soon to be seen are listed in your After Visit Summary, but are typically between 1-4 weeks after surgery.  OTHER INSTRUCTIONS:   Knee Replacement:  Do not place pillow under knee, focus on keeping the knee straight while resting. CPM  instructions: 0-90 degrees, 2 hours in the morning, 2 hours in the afternoon, and 2 hours in the evening. Place foam block, curve side up under heel at all times except when in CPM or when walking.  DO NOT modify, tear, cut, or change the foam block in any way.  MAKE SURE YOU:   Understand these instructions.   Get help right away if you are not doing well or get worse.    Thank you for letting us be a part of your medical care team.  It is a privilege we respect greatly.  We hope these instructions will help you stay on track for a fast and full recovery!   Information on my medicine - ELIQUIS (apixaban)  This medication education was reviewed with me or my healthcare representative as part of my discharge preparation.  The pharmacist that spoke with me during my hospital stay was:  Kem Parkinson, [place]  Why was Eliquis prescribed for you? Eliquis was prescribed for you to reduce the risk of blood clots forming after orthopedic surgery.    What do You need to know about Eliquis? Take your Eliquis TWICE DAILY - one tablet in the morning and one tablet in the evening with or without food.  It would be best to take the dose about the same time each day.  If you have difficulty swallowing the tablet whole please discuss with your pharmacist how to take the medication safely.  Take Eliquis exactly as prescribed by your doctor and DO NOT stop taking Eliquis without talking to the doctor who prescribed the medication.  Stopping without other medication to take the place of Eliquis may increase your risk of developing a clot.  After discharge, you should have regular check-up appointments with your healthcare provider that is prescribing your Eliquis.  What do you do if you miss a dose? If a dose of ELIQUIS is not taken at the scheduled time, take it as soon as possible on the same day and twice-daily administration should be resumed.  The dose should not be doubled  to make up for a  missed dose.  Do not take more than one tablet of ELIQUIS at the same time.  Important Safety Information A possible side effect of Eliquis is bleeding. You should call your healthcare provider right away if you experience any of the following: ? Bleeding from an injury or your nose that does not stop. ? Unusual colored urine (red or dark brown) or unusual colored stools (red or black). ? Unusual bruising for unknown reasons. ? A serious fall or if you hit your head (even if there is no bleeding).  Some medicines may interact with Eliquis and might increase your risk of bleeding or clotting while on Eliquis. To help avoid this, consult your healthcare provider or pharmacist prior to using any new prescription or non-prescription medications, including herbals, vitamins, non-steroidal anti-inflammatory drugs (NSAIDs) and supplements.  This website has more information on Eliquis (apixaban): http://www.eliquis.com/eliquis/home

## 2015-11-23 NOTE — Progress Notes (Signed)
Physical Therapy Treatment Patient Details Name: Cassidy Norris MRN: [id number] DOB: Apr 25, 1951 Today's Date: 11-29-15    History of Present Illness 64 y.o. female s/p left total knee arthroplasty. Hx of cancer, peripheral neuropathy due to chemotherapy, and multiple sclerosis.    PT Comments    Pt moving well today and able to ambulate in hall. Able to perform quad sets but not SLR yet and remains dependent on KI to prevent buckling in stance. Pt educated for HEP, transfers, CPM and bone foam use. Will continue to follow.   Follow Up Recommendations  SNF     Equipment Recommendations  Rolling walker with 5" wheels    Recommendations for Other Services       Precautions / Restrictions Precautions Precautions: Fall;Knee Required Braces or Orthoses: Knee Immobilizer - Left Restrictions LLE Weight Bearing: Weight bearing as tolerated    Mobility  Bed Mobility Overal bed mobility: Modified Independent                Transfers     Transfers: Sit to/from Stand Sit to Stand: Supervision         General transfer comment: cues for hand placement and safety, increased time to fully achieve standing  Ambulation/Gait Ambulation/Gait assistance: Supervision Ambulation Distance (Feet): 150 Feet Assistive device: Rolling walker (2 wheeled) Gait Pattern/deviations: Step-through pattern;Decreased stride length;Decreased weight shift to left   Gait velocity interpretation: Below normal speed for age/gender General Gait Details: KI for gait as pt with one period of buckling, cues for quad contraction in stance and RW use   Stairs            Wheelchair Mobility    Modified Rankin (Stroke Patients Only)       Balance                                    Cognition Arousal/Alertness: Awake/alert Behavior During Therapy: WFL for tasks assessed/performed Overall Cognitive Status: Within Functional Limits for tasks assessed                      Exercises Total Joint Exercises Short Arc QuadSinclair Ship;Left;15 reps;Supine Heel Slides: AROM;Left;15 reps;Supine Hip ABduction/ADduction: AROM;Left;15 reps;Supine Goniometric ROM: 4-90    General Comments        Pertinent Vitals/Pain Pain Score: 2  Pain Location: left knee Pain Descriptors / Indicators: Aching Pain Intervention(s): Limited activity within patient's tolerance;Monitored during session;Repositioned    Home Living                      Prior Function            PT Goals (current goals can now be found in the care plan section) Progress towards PT goals: Progressing toward goals    Frequency       PT Plan Current plan remains appropriate    Co-evaluation             End of Session   Activity Tolerance: Patient tolerated treatment well Patient left: in bed;in CPM;with call bell/phone within reach     Time: [id number] PT Time Calculation (min) (ACUTE ONLY): 37 min  Charges:  $Gait Training: 8-22 mins $Therapeutic Exercise: 8-22 mins                    G Codes:      Melford Aase 2015/11/29, 8:55 AM Lanetta Inch  [place], [place]

## 2015-11-23 NOTE — Evaluation (Signed)
Occupational Therapy Evaluation Patient Details Name: Cassidy Norris MRN: [id number] DOB: [birth year] Today's Date: 11/23/2015    History of Present Illness 64 y.o. female s/p left total knee arthroplasty. Hx of cancer, peripheral neuropathy due to chemotherapy, and multiple sclerosis.   Clinical Impression   Pt s/p above. Pt independent with ADLs, PTA. Feel pt will benefit from acute OT to increase independence prior to d/c.     Follow Up Recommendations  No OT follow up;Supervision - Intermittent    Equipment Recommendations  Other (comment) (AE)    Recommendations for Other Services       Precautions / Restrictions Precautions Precautions: Fall;Knee Precaution Booklet Issued: No Precaution Comments: educated on knee precautions Restrictions Weight Bearing Restrictions: Yes LLE Weight Bearing: Weight bearing as tolerated      Mobility Bed Mobility Overal bed mobility: Modified Independent              Transfers Overall transfer level: Needs assistance   Transfers: Sit to/from Stand Sit to Stand: Min guard            Balance      Used RW for ambulation. Balance not formally assessed.                                      ADL Overall ADL's : Needs assistance/impaired                     Lower Body Dressing: Minimal assistance;With adaptive equipment;Sit to/from stand   Toilet Transfer: Min guard;Ambulation;RW (Min guard-sit to stand; setup/supervision-ambulation)           Functional mobility during ADLs: Supervision/safety;Rolling walker;Set up General ADL Comments: Educated on LB dressing technique. Educated on AE. Pt practiced with sockaid.     Vision     Perception     Praxis      Pertinent Vitals/Pain Pain Assessment: 0-10 Pain Score: 2  Pain Location: left knee upon standing-posterior Pain Descriptors / Indicators: Other (Comment) (stiffness) Pain Intervention(s): Monitored during session      Hand Dominance Right   Extremity/Trunk Assessment Upper Extremity Assessment Upper Extremity Assessment: Overall WFL for tasks assessed   Lower Extremity Assessment Lower Extremity Assessment: Defer to PT evaluation       Communication Communication Communication: No difficulties   Cognition Arousal/Alertness: Awake/alert Behavior During Therapy: WFL for tasks assessed/performed Overall Cognitive Status: Within Functional Limits for tasks assessed                     General Comments       Exercises Exercises: Other exercises Other Exercises Other Exercises: performed 6 AROM left straight leg raises   Shoulder Instructions      Home Living Family/patient expects to be discharged to:: Private residence Living Arrangements: Alone Available Help at Discharge: Family;Available PRN/intermittently (sister) Type of Home: House Home Access: Stairs to enter [employer] of Steps: 3 Entrance Stairs-Rails: Left ("shelf") Home Layout: One level     Bathroom Shower/Tub: Walk-in shower;Curtain;Door (8 inch threshold)   Bathroom Toilet:  (elevated toilet)     Home Equipment: Shower seat - built in;Cane - single point          Prior Functioning/Environment Level of Independence: Independent        Comments: no device    OT Diagnosis: Acute pain   OT Problem List: Decreased range of motion;Obesity;Impaired UE functional use;Decreased  knowledge of use of DME or AE;Decreased activity tolerance   OT Treatment/Interventions: Self-care/ADL training;DME and/or AE instruction;Therapeutic activities;Patient/family education;Balance training    OT Goals(Current goals can be found in the care plan section) Acute Rehab OT Goals Patient Stated Goal: not stated OT Goal Formulation: With patient Time For Goal Achievement: 11/30/15 Potential to Achieve Goals: Good ADL Goals Pt Will Perform Lower Body Dressing: with set-up;with adaptive equipment;sit  to/from stand Pt Will Transfer to Toilet: with modified independence;ambulating (elevated toilet) Pt Will Perform Tub/Shower Transfer: Shower transfer;with supervision;ambulating;rolling walker;shower seat  OT Frequency: Min 2X/week   Barriers to D/C:            Co-evaluation              End of Session Equipment Utilized During Treatment: Rolling walker CPM Left Knee CPM Left Knee: Off  Activity Tolerance: Patient tolerated treatment well Patient left: in bed;with call bell/phone within reach   Time: [id number] OT Time Calculation (min): 19 min Charges:  OT General Charges $OT Visit: 1 Procedure OT Evaluation $Initial OT Evaluation Tier I: 1 Procedure G-CodesBenito Mccreedy OTR/L [id number] 11/23/2015, 4:07 PM

## 2015-11-23 NOTE — Progress Notes (Addendum)
Physical Therapy Treatment Patient Details Name: Cassidy Norris MRN: [id number] DOB: 1951-10-28 Today's Date: 11/23/2015    History of Present Illness 64 y.o. female s/p left total knee arthroplasty. Hx of cancer, peripheral neuropathy due to chemotherapy, and multiple sclerosis.    PT Comments    Cassidy Norris continues to move well without need for KI this session, no buckling and AROM SLR. Pt educated for stairs, HEP, alternating CPM and bone foam. Pt in bone foam in chair end of session. Pt encouraged to continue mobility. Will follow.   Follow Up Recommendations  Home health PT;Supervision - Intermittent     Equipment Recommendations  Rolling walker with 5" wheels;3in1 (PT)    Recommendations for Other Services       Precautions / Restrictions Precautions Precautions: Fall;Knee Required Braces or Orthoses: Knee Immobilizer - Left Restrictions LLE Weight Bearing: Weight bearing as tolerated    Mobility  Bed Mobility Overal bed mobility: Modified Independent             General bed mobility comments: in chair on arrival  Transfers     Transfers: Sit to/from Stand Sit to Stand: Supervision         General transfer comment: cues to control descent only  Ambulation/Gait Ambulation/Gait assistance: Supervision Ambulation Distance (Feet): 200 Feet Assistive device: Rolling walker (2 wheeled) Gait Pattern/deviations: Step-through pattern;Decreased stride length;Decreased dorsiflexion - left   Gait velocity interpretation: Below normal speed for age/gender General Gait Details: no buckling without KI this session and cues for knee flexion with swing through and heel strike LLE   Stairs Stairs: Yes Stairs assistance: Min assist Stair Management: Backwards;With walker Number of Stairs: 2 General stair comments: cues for sequence and assist to stabilize RW  Wheelchair Mobility    Modified Rankin (Stroke Patients Only)       Balance                                     Cognition Arousal/Alertness: Awake/alert Behavior During Therapy: WFL for tasks assessed/performed Overall Cognitive Status: Within Functional Limits for tasks assessed                      Exercises Total Joint Exercises  Heel Slides: AROM;Seated;Left;15 reps Straight Leg Raises: AROM;Seated;Left;15 reps Long Arc Quad: Left;Seated;15 reps;AROM     General Comments        Pertinent Vitals/Pain Pain Score: 2  Pain Location: left posterior knee Pain Descriptors / Indicators: Sore Pain Intervention(s): Limited activity within patient's tolerance;Monitored during session;Repositioned    Home Living                      Prior Function            PT Goals (current goals can now be found in the care plan section) Progress towards PT goals: Progressing toward goals    Frequency       PT Plan Discharge plan needs to be updated    Co-evaluation             End of Session Equipment Utilized During Treatment: Gait belt Activity Tolerance: Patient tolerated treatment well Patient left: in chair;with call bell/phone within Norris     Time: 1152-1230 PT Time Calculation (min) (ACUTE ONLY): 38 min  Charges:  $Gait Training: 23-37 mins $Therapeutic Exercise: 8-22 mins  G CodesMelford Norris Nov 30, 2015, 12:36 PM Cassidy Norris, [place]

## 2015-11-23 NOTE — Progress Notes (Signed)
Patient places herself on CPAP, Water in humidifier and patient will call if any assistance needed

## 2015-11-24 LAB — CBC
HCT: 35.5 % — ABNORMAL LOW (ref 36.0–46.0)
HEMOGLOBIN: 12 g/dL (ref 12.0–15.0)
MCH: 32.8 pg (ref 26.0–34.0)
MCHC: 33.8 g/dL (ref 30.0–36.0)
MCV: 97 fL (ref 78.0–100.0)
PLATELETS: 184 10*3/uL (ref 150–400)
RBC: 3.66 MIL/uL — ABNORMAL LOW (ref 3.87–5.11)
RDW: 13.4 % (ref 11.5–15.5)
WBC: 10.2 10*3/uL (ref 4.0–10.5)

## 2015-11-24 NOTE — Progress Notes (Signed)
     Subjective:  POD#2 LTK. Patient reports pain as mild to moderate.  Resting comfortably in bed this morning with CPM machine in place.  Up to 90 degrees passive flexion.  Mobilizing well with PT.  Plan is for patient to be discharge to home instead of SNF.  Case worker is checking to see if coordination for discharge today is possible.  If not, then plan is for discharge to home tomorrow morning.   Drain pulled today at bedside.  Dressings changed.  Objective:   VITALS:   Filed Vitals:   11/23/15 1300 11/23/15 2001 11/24/15 0322 11/24/15 0801  BP: 116/67 123/54 126/58 131/75  Pulse: 98 92 96 85  Temp: 98 F (36.7 C) 98.1 F (36.7 C) 98.6 F (37 C)   TempSrc: Oral Oral Oral   Resp: 16 16 16    Weight:      SpO2: 98% 98% 98%     Neurologically intact ABD soft Neurovascular intact Sensation intact distally Intact pulses distally Dorsiflexion/Plantar flexion intact Incision: dressing C/D/I Drained pulled and dressing applied, incision looking good  Lab Results  Component Value Date   WBC 10.2 11/24/2015   HGB 12.0 11/24/2015   HCT 35.5* 11/24/2015   MCV 97.0 11/24/2015   PLT 184 11/24/2015   BMET    Component Value Date/Time   NA 140 11/23/2015 0650   NA 142 09/23/2015 1124   NA 139 03/11/2015 1356   K 3.9 11/23/2015 0650   K 3.4* 03/11/2015 1356   CL 104 11/23/2015 0650   CL 103 03/11/2015 1356   CO2 27 11/23/2015 0650   CO2 28 03/11/2015 1356   GLUCOSE 162* 11/23/2015 0650   GLUCOSE 97 09/23/2015 1124   GLUCOSE 118* 03/11/2015 1356   BUN 9 11/23/2015 0650   BUN 13 09/23/2015 1124   BUN 13 03/11/2015 1356   CREATININE 0.83 11/23/2015 0650   CREATININE 0.69 03/11/2015 1356   CALCIUM 8.7* 11/23/2015 0650   CALCIUM 9.2 03/11/2015 1356   GFRNONAA >60 11/23/2015 0650   GFRNONAA >60 03/11/2015 1356   GFRNONAA >60 10/01/2014 1523   GFRAA >60 11/23/2015 0650   GFRAA >60 03/11/2015 1356   GFRAA >60 10/01/2014 1523     Assessment/Plan: 2 Days Post-Op     Active Problems:   Primary localized osteoarthritis of left knee   Up with therapy WBAT in the LLE Eliquis for dvt prophlyaxis   Cassidy Norris 11/24/2015, 10:22 AM Cell [phone]

## 2015-11-24 NOTE — Care Management Note (Signed)
Case Management Note  Patient Details  Name: Cassidy Norris MRN: [id number] Date of Birth: Sep 09, 1951  Subjective/Objective:        Left   TOTAL KNEE ARTHROPLASTY      Action/Plan:   CM received consult concerning [place] services HHPT and DME, patient was originally set up for SNF but not approved by insurance CM discussed recommendations with patients patient is agreeable. Offered choice selected [facility] for [facility] and DME. Referral called in to [facility] for [facility] for HHPT and rolling walker and 3 n 1 chair. Equipment will be delivered to room prior to discharge.     Expected Discharge Date:     11/23/2014             Expected Discharge Plan:  [place]  In-House Referral:  Clinical Social Work  Discharge planning Services  CM Consult  Post Acute Care Choice:  Home Health Choice offered to:  Patient  DME Arranged:  3-N-1, Walker rolling, CPM DME Agency:  [place]:  PT [place]:  [place]  Status of Service:  Completed, signed off  Medicare Important Message Given:    Date Medicare IM Given:    Medicare IM give by:    Date Additional Medicare IM Given:    Additional Medicare Important Message give by:     If discussed at [place] of Stay Meetings, dates discussed:    Additional CommentsLaurena Slimmer, RN 11/24/2015, 3:22 PM

## 2015-11-24 NOTE — Progress Notes (Signed)
Physical Therapy Treatment Patient Details Name: Cassidy Norris MRN: [id number] DOB: [birth year] Today's Date: 11/24/2015    History of Present Illness 65 y.o. female s/p left total knee arthroplasty. Hx of cancer, peripheral neuropathy due to chemotherapy, and multiple sclerosis.    PT Comments    Pt making great progress toward goals. Pt's current mobility level safe for discharge home today with family assistance.  Follow Up Recommendations  Home health PT;Supervision - Intermittent     Equipment Recommendations  Rolling walker with 5" wheels;3in1 (PT)       Precautions / Restrictions Precautions Precautions: Fall;Knee Precaution Comments: educated on knee precautions Required Braces or Orthoses: Knee Immobilizer - Left Restrictions Weight Bearing Restrictions: Yes LLE Weight Bearing: Weight bearing as tolerated    Mobility  Bed Mobility Overal bed mobility: Modified Independent       Supine to sit: Modified independent (Device/Increase time)        Transfers Overall transfer level: Needs assistance Equipment used: Rolling walker (2 wheeled) Transfers: Sit to/from Stand Sit to Stand: Modified independent (Device/Increase time)         General transfer comment: demo'd safe technique with sit<>stand  Ambulation/Gait Ambulation/Gait assistance: Supervision Ambulation Distance (Feet): 250 Feet Assistive device: Rolling walker (2 wheeled) Gait Pattern/deviations: Step-through pattern;Decreased stride length   Gait velocity interpretation: Below normal speed for age/gender General Gait Details: no knee buckling noted with gait. pt demo'd good left foot clearance and minimal left knee flexion with swing phase   Stairs         General stair comments: pt able to verbalize technique from previous session and felt comfortable with practice from last session. Deferred additional practice today.         Cognition Arousal/Alertness: Awake/alert Behavior  During Therapy: WFL for tasks assessed/performed Overall Cognitive Status: Within Functional Limits for tasks assessed         Exercises Total Joint Exercises Ankle Circles/Pumps: AROM;Both;10 reps;Supine Quad Sets: AROM;Strengthening;Left;10 reps;Supine Short Arc Quad: AROM;Strengthening;Left;10 reps;Supine Heel Slides: AROM;Strengthening;10 reps;Left;Supine Straight Leg Raises: AROM;Strengthening;Left;10 reps;Supine Goniometric ROM: supine left knee AROM 2-96 degrees flexion     Pertinent Vitals/Pain Pain Assessment: 0-10 Pain Score: 2  Pain Location: left knee with exercises Pain Descriptors / Indicators: Sore;Tightness Pain Intervention(s): Monitored during session;Limited activity within patient's tolerance;Premedicated before session;Repositioned     PT Goals (current goals can now be found in the care plan section) Acute Rehab PT Goals Patient Stated Goal: not stated PT Goal Formulation: With patient Time For Goal Achievement: 11/29/15 Potential to Achieve Goals: Good Progress towards PT goals: Progressing toward goals    Frequency  7X/week    PT Plan Current plan remains appropriate    End of Session Equipment Utilized During Treatment: Gait belt Activity Tolerance: Patient tolerated treatment well Patient left: in chair;with call bell/phone within reach     Time: 1020-1047 PT Time Calculation (min) (ACUTE ONLY): 27 min  Charges:  $Gait Training: 8-22 mins $Therapeutic Exercise: 8-22 mins                   Willow Ora 11/24/2015, 10:52 AM  Willow Ora, PTA, CLT Acute Rehab Services [phone] 11/24/2015, 10:54 AM

## 2015-11-24 NOTE — Progress Notes (Signed)
Occupational Therapy Treatment Patient Details Name: Cassidy Norris MRN: [id number] DOB: 02-May-1951 Today's Date: 11/24/2015    History of present illness 65 y.o. female s/p left total knee arthroplasty. Hx of cancer, peripheral neuropathy due to chemotherapy, and multiple sclerosis.   OT comments  Education provided in session. Feel pt is safe to d/c home, from OT standpoint.  Follow Up Recommendations  No OT follow up;Supervision - Intermittent    Equipment Recommendations  Other (comment) (AE if wanted)    Recommendations for Other Services      Precautions / Restrictions Precautions Precautions: Fall;Knee Precaution Comments: educated on knee precautions Restrictions Weight Bearing Restrictions: Yes LLE Weight Bearing: Weight bearing as tolerated       Mobility Bed Mobility  General bed mobility comments: pt in recliner chair when OT arrived.  Transfers Overall transfer level: Needs assistance Transfers: Sit to/from Stand Sit to Stand: Supervision (also setup for walker)            Balance      No LOB in session. Used RW for ambulation.                             ADL Overall ADL's : Needs assistance/impaired                       Lower Body Dressing Details (indicate cue type and reason): attempted to don/doff left sock but unable to do so             Functional mobility during ADLs: Set up;Supervision/safety;Rolling walker (set up for walker) General ADL Comments: Explained benefit of reaching down to left foot for LB dressing as it allows knee to bend. Educated on shower transfer technique, but pt is planning to sponge bathe for two weeks (reviewed technique with RW in case she still needs RW when she performs it at home). recommended someone be with her for shower transfer. Educated on AE. Educated on safety.       Vision                     Perception     Praxis      Cognition  Awake/Alert Behavior During  Therapy: WFL for tasks assessed/performed Overall Cognitive Status: Within Functional Limits for tasks assessed                       Extremity/Trunk Assessment                  Shoulder Instructions       General Comments      Pertinent Vitals/ Pain       Pain Assessment: 0-10 Pain Score: 2  Pain Location: left knee Pain Descriptors / Indicators: Cramping Pain Intervention(s): Monitored during session  Home Living                                          Prior Functioning/Environment              Frequency Min 2X/week     Progress Toward Goals  OT Goals(current goals can now be found in the care plan section)  Progress towards OT goals:  (adequate for d/c-verbalized understanding of information)  Acute Rehab OT Goals Patient Stated Goal: not stated OT Goal Formulation: With patient  Time For Goal Achievement: 11/30/15 Potential to Achieve Goals: Good ADL Goals Pt Will Perform Lower Body Dressing: with set-up;with adaptive equipment;sit to/from stand Pt Will Transfer to Toilet: with modified independence;ambulating (elevated toilet) Pt Will Perform Tub/Shower Transfer: Shower transfer;with supervision;ambulating;rolling walker;shower seat  Plan Discharge plan remains appropriate    Co-evaluation                 End of Session Equipment Utilized During Treatment: Rolling walker;Gait belt;Other (comment) (AE) CPM Left Knee CPM Left Knee: Off    Activity Tolerance Patient tolerated treatment well   Patient Left in chair;with call bell/phone within reach   Nurse Communication          Time: [id number] OT Time Calculation (min): 20 min  Charges: OT General Charges $OT Visit: 1 Procedure OT Treatments $Self Care/Home Management : 8-22 mins  Benito Mccreedy OTR/L [id number] 11/24/2015, 12:24 PM

## 2015-11-25 LAB — TYPE AND SCREEN
ABO/RH(D): O POS
ANTIBODY SCREEN: POSITIVE
DAT, IgG: NEGATIVE
DONOR AG TYPE: NEGATIVE
DONOR AG TYPE: NEGATIVE
DONOR AG TYPE: NEGATIVE
Donor AG Type: NEGATIVE
PT AG Type: NEGATIVE
UNIT DIVISION: 0
Unit division: 0
Unit division: 0
Unit division: 0

## 2015-11-25 NOTE — Progress Notes (Signed)
Received message that patient called concerned about her CPM not being delivered. Contacted Ruby Cola with [employer], he was not aware patient was discharge home on 11/24/15, they will deliver CPM to patient's home today. Contacted Ms Wehrli and informed her that [facility] will call her and then deliver CPM today. She stated that [facility] was also working on CPM. I informed her I would call [facility] and cancel CPM with them. From weekend CM note patient has been set up for HHPT with [place] but patient was set up with [facility] by MD office prior to surgery. Patient stated that she wanted to stay with [facility]. I told her I would cancel [facility]. Contacted [facility] and spoke with Veva, cancelled HHPT. Contacted [facility], spoke with Gerald Stabs, cancelled CPM.

## 2015-11-26 ENCOUNTER — Encounter (HOSPITAL_COMMUNITY): Payer: Self-pay | Admitting: Orthopedic Surgery

## 2015-12-02 ENCOUNTER — Ambulatory Visit: Payer: BLUE CROSS/BLUE SHIELD | Admitting: Oncology

## 2015-12-02 ENCOUNTER — Other Ambulatory Visit: Payer: BLUE CROSS/BLUE SHIELD

## 2015-12-18 ENCOUNTER — Telehealth: Payer: Self-pay | Admitting: Unknown Physician Specialty

## 2015-12-18 NOTE — Telephone Encounter (Signed)
Patient stated that she had knee surgery 11/22/15 and since then she was had a  loss stole every time after she eats and wants to know if she needs to come in and talk to [place]?

## 2015-12-18 NOTE — Telephone Encounter (Signed)
Patient would need an appointment.  

## 2015-12-27 NOTE — Discharge Summary (Signed)
PATIENT ID: Cassidy Norris        MRN:  [id number]          DOB/AGE: 05/10/1951 / 65 y.o.    DISCHARGE SUMMARY  ADMISSION DATE:    11/22/2015 DISCHARGE DATE:   11/24/15  ADMISSION DIAGNOSIS: OA LEFT KNEE    DISCHARGE DIAGNOSIS:  OA LEFT KNEE    ADDITIONAL DIAGNOSIS: Active Problems:   Primary localized osteoarthritis of left knee  Past Medical History  Diagnosis Date  . Personal history of tobacco use, presenting hazards to health   . Multiple sclerosis ([place])   . Diffuse cystic mastopathy   . Family history of malignant neoplasm of gastrointestinal tract 2012  . Obesity, unspecified   . Special screening for malignant neoplasms, colon   . Fractured elbow 08/28/14  . Hx of metabolic acidosis with increased anion gap   . Increased heart rate   . Cancer [facility]) 2014    right breast - Invasive Mammary Carcinoma  . Cancer [facility]) 2014    left breast - Invasive lobular carcinoma and DCIS   . Anemia   . Restless leg syndrome   . Sleep apnea   . Neuromuscular disorder ([place])     neuropathy in bil feet - left is worse.  . Peripheral neuropathy ([place])     PROCEDURE: Procedure(s): TOTAL KNEE ARTHROPLASTY Left on 11/22/2015  CONSULTS: PT/OT/SW      HISTORY:  See H&P in chart  HOSPITAL COURSE:  Cassidy Norris is a 66 y.o. admitted on 11/22/2015 and found to have a diagnosis of OA LEFT KNEE.  After appropriate laboratory studies were obtained  they were taken to the operating room on 11/22/2015 and underwent  Procedure(s): TOTAL KNEE ARTHROPLASTY  Left.   They were given perioperative antibiotics:  Anti-infectives    Start     Dose/Rate Route Frequency Ordered Stop   11/22/15 1730  ceFAZolin (ANCEF) IVPB 1 g/50 mL premix     1 g 100 mL/hr over 30 Minutes Intravenous Every 6 hours 11/22/15 1521 11/22/15 2359   11/22/15 0930  ceFAZolin (ANCEF) IVPB 2 g/50 mL premix     2 g 100 mL/hr over 30 Minutes Intravenous To [facility] 11/21/15 0837 11/22/15 1121     .  Tolerated the procedure well.    POD #1, allowed out of bed to a chair.  PT for ambulation and exercise program.  IV saline locked.  O2 discontionued.  POD #2, continued PT and ambulation.   Hemovac pulled. . The remainder of the hospital course was dedicated to ambulation and strengthening.   The patient was discharged on 2 days post op in  Stable condition.  Blood products given:none  DIAGNOSTIC STUDIES: Recent vital signs: No data found.      Recent laboratory studies: No results for input(s): WBC, HGB, HCT, PLT in the last 168 hours. No results for input(s): NA, K, CL, CO2, BUN, CREATININE, GLUCOSE, CALCIUM in the last 168 hours. Lab Results  Component Value Date   INR 1.04 11/12/2015     Recent Radiographic Studies :  No results found.  DISCHARGE INSTRUCTIONS:   DISCHARGE MEDICATIONS:     Medication List    STOP taking these medications        acetaminophen 325 MG tablet  Commonly known as:  TYLENOL     meloxicam 15 MG tablet  Commonly known as:  MOBIC     TYLENOL ARTHRITIS PAIN 650 MG CR tablet  Generic drug:  acetaminophen  TAKE these medications        apixaban 2.5 MG Tabs tablet  Commonly known as:  ELIQUIS  Take 1 tablet (2.5 mg total) by mouth 2 (two) times daily.     B-12 5000 MCG Subl  Place 1 tablet under the tongue daily.     CALCIUM 600 PO  Take 1 tablet by mouth 2 (two) times daily.     carvedilol 3.125 MG tablet  Commonly known as:  COREG  Take 1 tablet (3.125 mg total) by mouth 2 (two) times daily with a meal.     diphenhydrAMINE 25 MG tablet  Commonly known as:  SOMINEX  Take 50 mg by mouth at bedtime.     exemestane 25 MG tablet  Commonly known as:  AROMASIN  Take 1 tablet (25 mg total) by mouth daily after breakfast.     fluticasone 50 MCG/ACT nasal spray  Commonly known as:  FLONASE  Place 2 sprays into both nostrils at bedtime.     oxyCODONE-acetaminophen 5-325 MG tablet  Commonly known as:  ROXICET  Take 1-2  tablets by mouth every 4 (four) hours as needed for moderate pain or severe pain.     pramipexole 0.5 MG tablet  Commonly known as:  MIRAPEX  Take 1 tablet (0.5 mg total) by mouth daily.     traZODone 50 MG tablet  Commonly known as:  DESYREL  Take 1 tablet (50 mg total) by mouth at bedtime.     Turmeric 500 MG Caps  Take 1 capsule by mouth daily.     Vitamin D 2000 units tablet  Take 4,000 Units by mouth daily.        FOLLOW UP VISIT:       Follow-up Information    Follow up with CAFFREY JR,W D, MD. Schedule an appointment as soon as possible for a visit in 2 weeks.   Specialty:  Orthopedic Surgery   Contact information:   [place] [address] [phone]       Follow up with [facility].   Why:  medical equipment CPM machine, bedside commode,m rolling walker   Contact information:   [address] [place] [address] [phone]       Follow up with [facility].   Why:  [place] will be contacted at home to schedule services   Contact information:   [address] [place] [address] [phone]       DISPOSITION:   Home  CONDITION:  Stable  Chriss Czar, PA-C  12/27/2015 10:47 AM

## 2015-12-30 ENCOUNTER — Ambulatory Visit (INDEPENDENT_AMBULATORY_CARE_PROVIDER_SITE_OTHER): Payer: BLUE CROSS/BLUE SHIELD | Admitting: Cardiovascular Disease

## 2015-12-30 ENCOUNTER — Encounter: Payer: Self-pay | Admitting: Cardiovascular Disease

## 2015-12-30 VITALS — BP 132/86 | HR 94 | Ht 65.0 in | Wt 204.8 lb

## 2015-12-30 DIAGNOSIS — R000 Tachycardia, unspecified: Secondary | ICD-10-CM

## 2015-12-30 NOTE — Patient Instructions (Signed)
Medication Instructions: Continue same medications.   Labwork: None.   Procedures/Testing: None.   Follow-Up: As needed with Dr. Corie Allis.   Any Additional Special Instructions Will Be Listed Below (If Applicable).     If you need a refill on your cardiac medications before your next appointment, please call your pharmacy.   

## 2015-12-30 NOTE — Progress Notes (Signed)
Primary care physician: Dr. Jeananne Rama Referring physician: Dr. Jeb Levering  HPI  This is a pleasant 65 year old female who is here today for a follow-up visit regarding sinus tachycardia. She has no previous cardiac history. She has history of breast cancer status post bilateral mastectomy followed by chemotherapy with Adriamycin which was completed in September 2015. She also reports history of multiple sclerosis, neuropathy and arthritis. She quit smoking last year.  She had sinus tachycardia with no identifiable cause. Echocardiogram showed normal LV systolic function with grade 1 diastolic dysfunction and no significant pulmonary hypertension. The patient was started on small dose carvedilol was overall improvement in heart rate.she has been doing well since then.   No Known Allergies   Current Outpatient Prescriptions on File Prior to Visit  Medication Sig Dispense Refill  . Calcium Carbonate (CALCIUM 600 PO) Take 1 tablet by mouth 2 (two) times daily.    . carvedilol (COREG) 3.125 MG tablet Take 1 tablet (3.125 mg total) by mouth 2 (two) times daily with a meal. 180 tablet 3  . Cholecalciferol (VITAMIN D) 2000 UNITS tablet Take 4,000 Units by mouth daily.    . Cyanocobalamin (B-12) 5000 MCG SUBL Place 1 tablet under the tongue daily.    . diphenhydrAMINE (SOMINEX) 25 MG tablet Take 50 mg by mouth at bedtime.     Marland Kitchen exemestane (AROMASIN) 25 MG tablet Take 1 tablet (25 mg total) by mouth daily after breakfast. 90 tablet 1  . fluticasone (FLONASE) 50 MCG/ACT nasal spray Place 2 sprays into both nostrils at bedtime.   1  . oxyCODONE-acetaminophen (ROXICET) 5-325 MG tablet Take 1-2 tablets by mouth every 4 (four) hours as needed for moderate pain or severe pain. 100 tablet 0  . pramipexole (MIRAPEX) 0.5 MG tablet Take 1 tablet (0.5 mg total) by mouth daily. (Patient taking differently: Take 0.5 mg by mouth at bedtime. ) 90 tablet 3  . traZODone (DESYREL) 50 MG tablet Take 1 tablet (50 mg total) by  mouth at bedtime. 90 tablet 3   No current facility-administered medications on file prior to visit.     Past Medical History  Diagnosis Date  . Personal history of tobacco use, presenting hazards to health   . Multiple sclerosis ([place])   . Diffuse cystic mastopathy   . Family history of malignant neoplasm of gastrointestinal tract 2012  . Obesity, unspecified   . Special screening for malignant neoplasms, colon   . Fractured elbow 08/28/14  . Hx of metabolic acidosis with increased anion gap   . Increased heart rate   . Cancer [facility]) 2014    right breast - Invasive Mammary Carcinoma  . Cancer [facility]) 2014    left breast - Invasive lobular carcinoma and DCIS   . Anemia   . Restless leg syndrome   . Sleep apnea   . Neuromuscular disorder ([place])     neuropathy in bil feet - left is worse.  . Peripheral neuropathy [facility])      Past Surgical History  Procedure Laterality Date  . Wisdom tooth extraction  2006  . Polypectomy  1989  . Dilation and curettage of uterus  1983  . Colonoscopy  2010    Dr. Jamal Collin  . Tubal ligation    . Portacath placement  2015  . Spine surgery  09/14/14    Ruptured disk L4- L5  . Breast biopsy Right 1973,2001  . Breast biopsy Right 11-07-13    INVASIVE MAMMARY CARCINOMA , ER/PR positive Her2 negative  . Breast  biopsy Left 11-27-13    invasive lobular and DCIS  . Breast surgery Bilateral 01/09/14    mastectomy  . Colonoscopy with propofol N/A 06/11/2015    Procedure: COLONOSCOPY WITH PROPOFOL;  Surgeon: Christene Lye, MD;  Location: [facility] ENDOSCOPY;  Service: Endoscopy;  Laterality: N/A;  . Cholecystectomy    . Total knee arthroplasty Left 11/22/2015    Procedure: TOTAL KNEE ARTHROPLASTY;  Surgeon: Earlie Server, MD;  Location: [place];  Service: Orthopedics;  Laterality: Left;     Family History  Problem Relation Age of Onset  . Cancer Mother     lung  . Cancer Father     colon, stomach  . Diabetes Maternal Grandmother   . Rectal cancer  Paternal Uncle   . Rectal cancer Paternal Uncle   . Hypertension Sister      Social History   Social History  . Marital Status: Widowed    Spouse Name: N/A  . Number of Children: N/A  . Years of Education: N/A   Occupational History  . Not on file.   Social History Main Topics  . Smoking status: Former Smoker -- 1.00 packs/day for 30 years    Types: Cigarettes    Quit date: 12/11/2013  . Smokeless tobacco: Never Used  . Alcohol Use: No     Comment: social  . Drug Use: No  . Sexual Activity: No   Other Topics Concern  . Not on file   Social History Narrative     ROS A 10 point review of system was performed. It is negative other than that mentioned in the history of present illness.   PHYSICAL EXAM   BP 132/86 mmHg  Pulse 94  Ht 5' 5"  (1.651 m)  Wt 204 lb 12 oz (92.874 kg)  BMI 34.07 kg/m2  LMP 12/22/1999 (Approximate) Constitutional: She is oriented to person, place, and time. She appears well-developed and well-nourished. No distress.  HENT: No nasal discharge.  Head: Normocephalic and atraumatic.  Eyes: Pupils are equal and round. No discharge.  Neck: Normal range of motion. Neck supple. No JVD present. No thyromegaly present.  Cardiovascular: Normal rate, regular rhythm, normal heart sounds. Exam reveals no gallop and no friction rub. No murmur heard.  Pulmonary/Chest: Effort normal and breath sounds normal. No stridor. No respiratory distress. She has no wheezes. She has no rales. She exhibits no tenderness.  Abdominal: Soft. Bowel sounds are normal. She exhibits no distension. There is no tenderness. There is no rebound and no guarding.  Musculoskeletal: Normal range of motion. She exhibits no edema and no tenderness.  Neurological: She is alert and oriented to person, place, and time. Coordination normal.  Skin: Skin is warm and dry. No rash noted. She is not diaphoretic. No erythema. No pallor.  Psychiatric: She has a normal mood and affect. Her  behavior is normal. Judgment and thought content normal.        ASSESSMENT AND PLAN

## 2016-01-02 ENCOUNTER — Encounter: Payer: Self-pay | Admitting: Cardiovascular Disease

## 2016-01-02 NOTE — Assessment & Plan Note (Signed)
The patient likely has inappropriate sinus tachycardia which improved with small dose carvedilol. She is currently doing well with no cardiac symptoms. She can follow-up with me as needed. If no other cardiac issues, I asked her to get further refills from Dr. Jeananne Rama.

## 2016-01-06 ENCOUNTER — Other Ambulatory Visit: Payer: BLUE CROSS/BLUE SHIELD

## 2016-01-06 ENCOUNTER — Ambulatory Visit: Payer: BLUE CROSS/BLUE SHIELD | Admitting: Oncology

## 2016-01-10 ENCOUNTER — Inpatient Hospital Stay (HOSPITAL_BASED_OUTPATIENT_CLINIC_OR_DEPARTMENT_OTHER): Payer: BLUE CROSS/BLUE SHIELD | Admitting: Oncology

## 2016-01-10 ENCOUNTER — Inpatient Hospital Stay: Payer: BLUE CROSS/BLUE SHIELD | Attending: Oncology

## 2016-01-10 VITALS — BP 131/83 | HR 97 | Temp 97.2°F | Resp 18 | Wt 205.2 lb

## 2016-01-10 DIAGNOSIS — C50912 Malignant neoplasm of unspecified site of left female breast: Secondary | ICD-10-CM | POA: Diagnosis not present

## 2016-01-10 DIAGNOSIS — Z87891 Personal history of nicotine dependence: Secondary | ICD-10-CM | POA: Diagnosis not present

## 2016-01-10 DIAGNOSIS — Z79811 Long term (current) use of aromatase inhibitors: Secondary | ICD-10-CM | POA: Insufficient documentation

## 2016-01-10 DIAGNOSIS — E872 Acidosis: Secondary | ICD-10-CM | POA: Diagnosis not present

## 2016-01-10 DIAGNOSIS — C50911 Malignant neoplasm of unspecified site of right female breast: Secondary | ICD-10-CM

## 2016-01-10 DIAGNOSIS — Z79899 Other long term (current) drug therapy: Secondary | ICD-10-CM | POA: Insufficient documentation

## 2016-01-10 DIAGNOSIS — G4730 Sleep apnea, unspecified: Secondary | ICD-10-CM | POA: Diagnosis not present

## 2016-01-10 DIAGNOSIS — Z9221 Personal history of antineoplastic chemotherapy: Secondary | ICD-10-CM | POA: Insufficient documentation

## 2016-01-10 DIAGNOSIS — G2581 Restless legs syndrome: Secondary | ICD-10-CM | POA: Diagnosis not present

## 2016-01-10 DIAGNOSIS — Z9013 Acquired absence of bilateral breasts and nipples: Secondary | ICD-10-CM | POA: Insufficient documentation

## 2016-01-10 DIAGNOSIS — R6884 Jaw pain: Secondary | ICD-10-CM | POA: Diagnosis not present

## 2016-01-10 DIAGNOSIS — Z170 Estrogen receptor positive status [ER+]: Secondary | ICD-10-CM | POA: Diagnosis not present

## 2016-01-10 DIAGNOSIS — E669 Obesity, unspecified: Secondary | ICD-10-CM | POA: Diagnosis not present

## 2016-01-10 DIAGNOSIS — G35 Multiple sclerosis: Secondary | ICD-10-CM | POA: Insufficient documentation

## 2016-01-10 LAB — CBC WITH DIFFERENTIAL/PLATELET
BASOS ABS: 0.1 10*3/uL (ref 0–0.1)
Basophils Relative: 1 %
EOS ABS: 0.2 10*3/uL (ref 0–0.7)
EOS PCT: 3 %
HCT: 44.5 % (ref 35.0–47.0)
Hemoglobin: 15.2 g/dL (ref 12.0–16.0)
Lymphocytes Relative: 23 %
Lymphs Abs: 2.1 10*3/uL (ref 1.0–3.6)
MCH: 31.7 pg (ref 26.0–34.0)
MCHC: 34.1 g/dL (ref 32.0–36.0)
MCV: 92.9 fL (ref 80.0–100.0)
MONO ABS: 0.8 10*3/uL (ref 0.2–0.9)
Monocytes Relative: 9 %
Neutro Abs: 5.9 10*3/uL (ref 1.4–6.5)
Neutrophils Relative %: 64 %
PLATELETS: 237 10*3/uL (ref 150–440)
RBC: 4.79 MIL/uL (ref 3.80–5.20)
RDW: 13.2 % (ref 11.5–14.5)
WBC: 9.1 10*3/uL (ref 3.6–11.0)

## 2016-01-10 LAB — COMPREHENSIVE METABOLIC PANEL
ALT: 21 U/L (ref 14–54)
AST: 19 U/L (ref 15–41)
Albumin: 4.4 g/dL (ref 3.5–5.0)
Alkaline Phosphatase: 93 U/L (ref 38–126)
Anion gap: 6 (ref 5–15)
BUN: 16 mg/dL (ref 6–20)
CHLORIDE: 105 mmol/L (ref 101–111)
CO2: 26 mmol/L (ref 22–32)
CREATININE: 0.73 mg/dL (ref 0.44–1.00)
Calcium: 9.7 mg/dL (ref 8.9–10.3)
GFR calc non Af Amer: >60 mL/min (ref >60)
Glucose, Bld: 127 mg/dL — ABNORMAL HIGH (ref 65–99)
POTASSIUM: 4.3 mmol/L (ref 3.5–5.1)
SODIUM: 137 mmol/L (ref 135–145)
Total Bilirubin: 0.6 mg/dL (ref 0.3–1.2)
Total Protein: 7.6 g/dL (ref 6.5–8.1)

## 2016-01-10 NOTE — Progress Notes (Signed)
[place]  Telephone:([phone]) [phone]  Fax:([phone]) [phone]     Cassidy Norris DOB: [birth year]  MR#: [id number]  VEH#:[id number]  Patient Care Team: Guadalupe Maple, MD as PCP - General (Family Medicine) Seeplaputhur Robinette Haines, MD (General Surgery) Guadalupe Maple, MD (Family Medicine) Forest Gleason, MD (Oncology)  CHIEF COMPLAINT:  Chief Complaint  Patient presents with  . Breast Cancer    INTERVAL HISTORY:  Patient is here for continued follow-up regarding bilateral carcinoma of breasts. Her right breast cancer was a stage IC and is status post radical mastectomy and her left breast cancer is a stage II and is status post radical mastectomy. Both tumors were ER/PR positive and HER-2 negative by FISH. She did complete cycles of Adriamycin and Cytoxan and 2015. She previously had a poor tolerance to tamoxifen as well as letrozole, she's currently on exemestane, calcium, and vitamin D and tolerating well. She reports recently having left knee replacement as well as evaluation for sinus tachycardia with Dr. Fletcher Anon. States that on Wednesday of this past week she developed sharp jaw pain in her right jaw radiating down to her neck when chewing on food. This occurred 2-3 times during 4- 5 hours all while she was eating. Pain has resolved but there is some very mild swelling in right jaw/neck. She overall reports feeling very well and denies any acute complaints.  REVIEW OF SYSTEMS:   Review of Systems  Constitutional: Negative for fever, chills, weight loss, malaise/fatigue and diaphoresis.  HENT: Negative.        On Wednesday of this past week patient had episode of sharp right jaw pain when chewing, lasted a few minutes and happened 3 times that day but has resolved.  Eyes: Negative.   Respiratory: Negative for cough, hemoptysis, sputum production, shortness of breath and wheezing.   Cardiovascular: Negative for chest pain, palpitations, orthopnea, claudication, leg swelling and  PND.  Gastrointestinal: Negative for heartburn, nausea, vomiting, abdominal pain, diarrhea, constipation, blood in stool and melena.  Genitourinary: Negative.   Musculoskeletal: Negative.   Skin: Negative.   Neurological: Negative for dizziness, tingling, focal weakness, seizures and weakness.  Endo/Heme/Allergies: Does not bruise/bleed easily.  Psychiatric/Behavioral: Negative for depression. The patient is not nervous/anxious and does not have insomnia.     As per HPI. Otherwise, a complete review of systems is negatve.  ONCOLOGY HISTORY: Oncology History   1. Bilateral carcinoma of breast, February, 2015. Right breast status post radical mastectomy. T1 C. N0M0 (isolated tumor cells in one lymph node) multifocal invasive cancer. Stage IC, Left breast, Status post radical mastectomy, T2, N1, M0 tumor. Stage II, RIght breast. Both tumors are estrogen receptor positive.  Progesterone receptor positive.  HER-2 receptor negative by FISH. Negative for BRCA mutation.  2. Started on Adriamycin and Cytoxan on March 01, 2014. Last cycle of Cytoxan and Adriamycin on May 01, 2014. Patient has finished last chemotherapy on September 9. (12 th dose was omitted because of neuropathy) 3.  Taking tamoxifen.  Patient had a poor tolerance to letrozole.(October, 2015) 4.  Patient is taking letrozole since November of 2015 5.  May, 2016 Letrozole has been put on hold because of bony pains 6.  Started on Aromasin from July of 2016      Breast cancer bilateral, multifocal ER/PR pos, Her 2 neg,.   11/29/2013 Initial Diagnosis Breast cancer bilateral, multifocal ER/PR pos, Her 2 neg,.    PAST MEDICAL HISTORY: Past Medical History  Diagnosis Date  . Personal history of  tobacco use, presenting hazards to health   . Multiple sclerosis ([place])   . Diffuse cystic mastopathy   . Family history of malignant neoplasm of gastrointestinal tract 2012  . Obesity, unspecified   . Special screening for malignant  neoplasms, colon   . Fractured elbow 08/28/14  . Hx of metabolic acidosis with increased anion gap   . Increased heart rate   . Cancer [facility]) 2014    right breast - Invasive Mammary Carcinoma  . Cancer [facility]) 2014    left breast - Invasive lobular carcinoma and DCIS   . Anemia   . Restless leg syndrome   . Sleep apnea   . Neuromuscular disorder ([place])     neuropathy in bil feet - left is worse.  . Peripheral neuropathy ([place])     PAST SURGICAL HISTORY: Past Surgical History  Procedure Laterality Date  . Wisdom tooth extraction  2006  . Polypectomy  1989  . Dilation and curettage of uterus  1983  . Colonoscopy  2010    Dr. Jamal Collin  . Tubal ligation    . Portacath placement  2015  . Spine surgery  09/14/14    Ruptured disk L4- L5  . Breast biopsy Right 1973,2001  . Breast biopsy Right 11-07-13    INVASIVE MAMMARY CARCINOMA , ER/PR positive Her2 negative  . Breast biopsy Left 11-27-13    invasive lobular and DCIS  . Breast surgery Bilateral 01/09/14    mastectomy  . Colonoscopy with propofol N/A 06/11/2015    Procedure: COLONOSCOPY WITH PROPOFOL;  Surgeon: Christene Lye, MD;  Location: [facility] ENDOSCOPY;  Service: Endoscopy;  Laterality: N/A;  . Cholecystectomy    . Total knee arthroplasty Left 11/22/2015    Procedure: TOTAL KNEE ARTHROPLASTY;  Surgeon: Earlie Server, MD;  Location: [place];  Service: Orthopedics;  Laterality: Left;    FAMILY HISTORY Family History  Problem Relation Age of Onset  . Cancer Mother     lung  . Cancer Father     colon, stomach  . Diabetes Maternal Grandmother   . Rectal cancer Paternal Uncle   . Rectal cancer Paternal Uncle   . Hypertension Sister     GYNECOLOGIC HISTORY:  Patient's last menstrual period was 12/22/1999 (approximate).     ADVANCED DIRECTIVES:    HEALTH MAINTENANCE: Social History  Substance Use Topics  . Smoking status: Former Smoker -- 1.00 packs/day for 30 years    Types: Cigarettes    Quit date: 12/11/2013  .  Smokeless tobacco: Never Used  . Alcohol Use: No     Comment: social     Colonoscopy:  PAP:  Bone density:  Mammogram:  No Known Allergies  Current Outpatient Prescriptions  Medication Sig Dispense Refill  . Calcium Carbonate (CALCIUM 600 PO) Take 1 tablet by mouth 2 (two) times daily.    . carvedilol (COREG) 3.125 MG tablet Take 1 tablet (3.125 mg total) by mouth 2 (two) times daily with a meal. 180 tablet 3  . Cholecalciferol (VITAMIN D) 2000 UNITS tablet Take 4,000 Units by mouth daily.    . Cyanocobalamin (B-12) 5000 MCG SUBL Place 1 tablet under the tongue daily.    . diphenhydrAMINE (SOMINEX) 25 MG tablet Take 50 mg by mouth at bedtime.     Marland Kitchen exemestane (AROMASIN) 25 MG tablet Take 1 tablet (25 mg total) by mouth daily after breakfast. 90 tablet 1  . fluticasone (FLONASE) 50 MCG/ACT nasal spray Place 2 sprays into both nostrils at bedtime.   1  .  oxyCODONE-acetaminophen (ROXICET) 5-325 MG tablet Take 1-2 tablets by mouth every 4 (four) hours as needed for moderate pain or severe pain. 100 tablet 0  . pramipexole (MIRAPEX) 0.5 MG tablet Take 1 tablet (0.5 mg total) by mouth daily. (Patient taking differently: Take 0.5 mg by mouth at bedtime. ) 90 tablet 3  . traZODone (DESYREL) 50 MG tablet Take 1 tablet (50 mg total) by mouth at bedtime. 90 tablet 3   No current facility-administered medications for this visit.    OBJECTIVE: BP 131/83 mmHg  Pulse 97  Temp(Src) 97.2 F (36.2 C) (Tympanic)  Resp 18  Wt 205 lb 4 oz (93.101 kg)  LMP 12/22/1999 (Approximate)   Body mass index is 34.16 kg/(m^2).    ECOG FS:0 - Asymptomatic  General: Well-developed, well-nourished, no acute distress. Eyes: Pink conjunctiva, anicteric sclera. HEENT: Normocephalic, moist mucous membranes, clear oropharnyx. Very slight right jaw and neck edema, no palpable nodules or tenderness. Lungs: Clear to auscultation bilaterally. Heart: Regular rate and rhythm. No rubs, murmurs, or gallops. Abdomen: Soft,  nontender, nondistended. No organomegaly noted, normoactive bowel sounds. Breast: Bilateral radical mastectomies.  No masses or fullness palpated in chest wall.  Axilla palpated in both positions with no masses palpated. Mild buildup of fluid pockets. Musculoskeletal: No edema, cyanosis, or clubbing. Neuro: Alert, answering all questions appropriately. Cranial nerves grossly intact. Skin: No rashes or petechiae noted. Psych: Normal affect. Lymphatics: No cervical, clavicular, or axillary LAD.   LAB RESULTS:  Appointment on 01/10/2016  Component Date Value Ref Range Status  . WBC 01/10/2016 9.1  3.6 - 11.0 K/uL Final  . RBC 01/10/2016 4.79  3.80 - 5.20 MIL/uL Final  . Hemoglobin 01/10/2016 15.2  12.0 - 16.0 g/dL Final  . HCT 01/10/2016 44.5  35.0 - 47.0 % Final  . MCV 01/10/2016 92.9  80.0 - 100.0 fL Final  . MCH 01/10/2016 31.7  26.0 - 34.0 pg Final  . MCHC 01/10/2016 34.1  32.0 - 36.0 g/dL Final  . RDW 01/10/2016 13.2  11.5 - 14.5 % Final  . Platelets 01/10/2016 237  150 - 440 K/uL Final  . Neutrophils Relative % 01/10/2016 64   Final  . Neutro Abs 01/10/2016 5.9  1.4 - 6.5 K/uL Final  . Lymphocytes Relative 01/10/2016 23   Final  . Lymphs Abs 01/10/2016 2.1  1.0 - 3.6 K/uL Final  . Monocytes Relative 01/10/2016 9   Final  . Monocytes Absolute 01/10/2016 0.8  0.2 - 0.9 K/uL Final  . Eosinophils Relative 01/10/2016 3   Final  . Eosinophils Absolute 01/10/2016 0.2  0 - 0.7 K/uL Final  . Basophils Relative 01/10/2016 1   Final  . Basophils Absolute 01/10/2016 0.1  0 - 0.1 K/uL Final  . Sodium 01/10/2016 137  135 - 145 mmol/L Final  . Potassium 01/10/2016 4.3  3.5 - 5.1 mmol/L Final  . Chloride 01/10/2016 105  101 - 111 mmol/L Final  . CO2 01/10/2016 26  22 - 32 mmol/L Final  . Glucose, Bld 01/10/2016 127* 65 - 99 mg/dL Final  . BUN 01/10/2016 16  6 - 20 mg/dL Final  . Creatinine, Ser 01/10/2016 0.73  0.44 - 1.00 mg/dL Final  . Calcium 01/10/2016 9.7  8.9 - 10.3 mg/dL Final  .  Total Protein 01/10/2016 7.6  6.5 - 8.1 g/dL Final  . Albumin 01/10/2016 4.4  3.5 - 5.0 g/dL Final  . AST 01/10/2016 19  15 - 41 U/L Final  . ALT 01/10/2016 21  14 -  54 U/L Final  . Alkaline Phosphatase 01/10/2016 93  38 - 126 U/L Final  . Total Bilirubin 01/10/2016 0.6  0.3 - 1.2 mg/dL Final  . GFR calc non Af Amer 01/10/2016 >60  >60 mL/min Final  . GFR calc Af Amer 01/10/2016 >60  >60 mL/min Final   Comment: (NOTE) The eGFR has been calculated using the CKD EPI equation. This calculation has not been validated in all clinical situations. eGFR's persistently <60 mL/min signify possible Chronic Kidney Disease.   . Anion gap 01/10/2016 6  5 - 15 Final    STUDIES: No results found.  ASSESSMENT:  Bilateral Carcinomas, multifocal.  Carcinoma of right breast, stage I C, T1 cN0 M0 (isolated tumor cells in 1 lymph node). Carcinoma of left breast, stage II, T2 N1 M0.  PLAN:   1. Bilateral breast carcinomas, multifocal. Patient is status post bilateral radical mastectomies, several cycles of Adriamycin and Cytoxan. Clinically there is no evidence of recurrent disease. She was intolerant to tamoxifen and letrozole but is tolerating exemestane, calcium, and vitamin D very well. She was recently in the hospital following a left knee replacement. Also have follow-up with Dr. Fletcher Anon regarding sinus tachycardia was started on Coreg and heart rate under better control. She continues to follow with Dr. Janae Bridgeman her PCP.  Will continue with routine follow-up in approximately 6 months.  Patient expressed understanding and was in agreement with this plan. She also understands that She can call clinic at any time with any questions, concerns, or complaints.   Dr. Oliva Bustard was available for consultation and review of plan of care for this patient.  Breast cancer bilateral, multifocal ER/PR pos, Her 2 neg,.   Staging form: Breast, AJCC 7th Edition     Clinical: Stage IIB (T2, N1, M0) - Signed by Forest Gleason, MD on 04/22/2015   Evlyn Kanner, NP   01/10/2016 10:39 AM

## 2016-01-10 NOTE — Progress Notes (Signed)
Patient states she had left knee surgery Dec. 30th. Otherwise, patient is doing well.

## 2016-03-30 ENCOUNTER — Encounter: Payer: Self-pay | Admitting: Neurology

## 2016-03-30 ENCOUNTER — Ambulatory Visit (INDEPENDENT_AMBULATORY_CARE_PROVIDER_SITE_OTHER): Payer: BLUE CROSS/BLUE SHIELD | Admitting: Neurology

## 2016-03-30 VITALS — BP 120/78 | HR 107 | Ht 65.0 in | Wt 216.0 lb

## 2016-03-30 DIAGNOSIS — G35 Multiple sclerosis: Secondary | ICD-10-CM

## 2016-03-30 NOTE — Progress Notes (Signed)
NEUROLOGY FOLLOW UP OFFICE NOTE  ARICELI STOBAUGH ZC:[id number]  HISTORY OF PRESENT ILLNESS: Cassidy Norris is a 65 year old right-handed woman with multiple sclerosis, former smoker, status post bilateral breast cancer February 28, 2014 status post bilateral mastectomy and status post L4-5 laminectomy who follows up for multiple sclerosis.  She is accompanied by her sister who provides some history.  Labs reviewed.    UPDATE: She is not on a disease-modifying agent.  She takes vitamin D 4000 u daily. Level from 09/09/15 was 53.5. She had left total knee arthroplasty in December.  She still has some swelling and electric pain in the knee.  Overall, she is doing well. MRI of brain with and without contrast from 03/12/15 showed non-enhancing T2 and FLAIR hyperintense lesions in the cerebral white matter, some consistent with Dawson's fingers.  MRI of cervical spine showed no cord lesions.  HISTORY: Her husband passed away due to [place] in 03-01-99.  In November 2001, she tripped and fell, hitting her head.  She went to the ED where a CT of the head was reportedly unremarkable.  She was advised to have an MRI of the brain, which was performed on 12/12/00.  It revealed small round and elliptical white matter lesions less than 1 cm in the periventricular region.  In December of 2002, she followed up with a neurologist, Dr. Corinna Capra.  VEP performed in February 28, 2002 showed prolonged p100 latency in the right optic nerve.  BAER was normal.  She reportedly had a lumbar puncture where CSF results were supportive of MS.  She was given 5 days of Solu-Medrol followed by 3 weeks of oral prednisone.  She was advised to start an interferon, but due to possible side effects, she decided to hold off and see if she clinically progresses.  She did have a follow up MRI of the brain in December 2004, which reportedly showed an enhancing lesion as well as a remote lesion in the cerebellum in addition to the periventricular white matter lesions.  She  never had any noticeable clinical flare-ups and had done well off of a disease-modifying agent.  Over the past 3 years, she notes possible worsening in depth perception.  Sometimes she notes difficulty grabbing for objects or will drive too close to the curb.  She had an MRI of the brain and cervical spine with and without contrast on 03/12/15, which showed non-enhancing bilateral periventricular white matter lesions oriented perpendicular to the ventricles and corpus callosum, as well as involving temporal lobe white matter and mild involvement of the left side of cerebellum.  No cervical cord lesions were seen.  In Feb 28, 2014, she was diagnosed with bilateral breast cancer.  She underwent bilateral mastectomy and chemotherapy, which ended in September.  As a result of the chemotherapy, she has neuropathy in the feet.  She also had problems with her left leg and was found to have a ruptured disc at L4-5 and underwent surgery in October 2015.  Since chemotherapy, she notes some mild memory problems.  She sometimes misplaces objects or will forget if she locked the front door when she left.  She will sometimes have difficulty getting a word out.  PAST MEDICAL HISTORY: Past Medical History  Diagnosis Date  . Personal history of tobacco use, presenting hazards to health   . Multiple sclerosis ([place])   . Diffuse cystic mastopathy   . Family history of malignant neoplasm of gastrointestinal tract Mar 01, 2011  . Obesity, unspecified   . Special screening for malignant neoplasms, colon   .  Fractured elbow 08/28/14  . Hx of metabolic acidosis with increased anion gap   . Increased heart rate   . Cancer [facility]) 2014    right breast - Invasive Mammary Carcinoma  . Cancer [facility]) 2014    left breast - Invasive lobular carcinoma and DCIS   . Anemia   . Restless leg syndrome   . Sleep apnea   . Neuromuscular disorder ([place])     neuropathy in bil feet - left is worse.  . Peripheral neuropathy [facility])      MEDICATIONS: Current Outpatient Prescriptions on File Prior to Visit  Medication Sig Dispense Refill  . Calcium Carbonate (CALCIUM 600 PO) Take 1 tablet by mouth 2 (two) times daily.    . carvedilol (COREG) 3.125 MG tablet Take 1 tablet (3.125 mg total) by mouth 2 (two) times daily with a meal. 180 tablet 3  . Cholecalciferol (VITAMIN D) 2000 UNITS tablet Take 4,000 Units by mouth daily.    . Cyanocobalamin (B-12) 5000 MCG SUBL Place 1 tablet under the tongue daily.    . diphenhydrAMINE (SOMINEX) 25 MG tablet Take 50 mg by mouth at bedtime.     Marland Kitchen exemestane (AROMASIN) 25 MG tablet Take 1 tablet (25 mg total) by mouth daily after breakfast. 90 tablet 1  . fluticasone (FLONASE) 50 MCG/ACT nasal spray Place 2 sprays into both nostrils at bedtime.   1  . pramipexole (MIRAPEX) 0.5 MG tablet Take 1 tablet (0.5 mg total) by mouth daily. (Patient taking differently: Take 0.5 mg by mouth at bedtime. ) 90 tablet 3  . traZODone (DESYREL) 50 MG tablet Take 1 tablet (50 mg total) by mouth at bedtime. 90 tablet 3   No current facility-administered medications on file prior to visit.    ALLERGIES: No Known Allergies  FAMILY HISTORY: Family History  Problem Relation Age of Onset  . Cancer Mother     lung  . Cancer Father     colon, stomach  . Diabetes Maternal Grandmother   . Rectal cancer Paternal Uncle   . Rectal cancer Paternal Uncle   . Hypertension Sister     SOCIAL HISTORY: Social History   Social History  . Marital Status: Widowed    Spouse Name: N/A  . Number of Children: N/A  . Years of Education: N/A   Occupational History  . Not on file.   Social History Main Topics  . Smoking status: Former Smoker -- 1.00 packs/day for 30 years    Types: Cigarettes    Quit date: 12/11/2013  . Smokeless tobacco: Never Used  . Alcohol Use: No     Comment: social  . Drug Use: No  . Sexual Activity: No   Other Topics Concern  . Not on file   Social History Narrative    REVIEW  OF SYSTEMS: Constitutional: No fevers, chills, or sweats, no generalized fatigue, change in appetite Eyes: No visual changes, double vision, eye pain Ear, nose and throat: No hearing loss, ear pain, nasal congestion, sore throat Cardiovascular: No chest pain, palpitations Respiratory:  No shortness of breath at rest or with exertion, wheezes GastrointestinaI: No nausea, vomiting, diarrhea, abdominal pain, fecal incontinence Genitourinary:  No dysuria, urinary retention or frequency Musculoskeletal:  No neck pain, back pain Integumentary: No rash, pruritus, skin lesions Neurological: as above Psychiatric: No depression, insomnia, anxiety Endocrine: No palpitations, fatigue, diaphoresis, mood swings, change in appetite, change in weight, increased thirst Hematologic/Lymphatic:  No anemia, purpura, petechiae. Allergic/Immunologic: no itchy/runny eyes, nasal congestion, recent allergic  reactions, rashes  PHYSICAL EXAM: Filed Vitals:   03/30/16 1453  BP: 120/78  Pulse: 107   General: No acute distress.   Head:  Normocephalic/atraumatic Eyes:  Fundi examined but not visualized Neck: supple, no paraspinal tenderness, full range of motion Heart:  Regular rate and rhythm Lungs:  Clear to auscultation bilaterally Back: No paraspinal tenderness Neurological Exam: alert and oriented to person, place, and time. Attention span and concentration intact, recent and remote memory intact, fund of knowledge intact.  Speech fluent and not dysarthric, language intact.  CN II-XII intact. Fundoscopic exam unremarkable without vessel changes, exudates, hemorrhages or papilledema.  Bulk and tone normal, muscle strength 5/5 throughout.  Sensation to pinprick decreased in left foot, vibration intact.  Deep tendon reflexes 2+ throughout, toes downgoing.  Finger to nose and heel to shin testing intact.  Gait normal, Timed 25-foot walk 4.97 seconds, trouble with tandem walking, Romberg  negative.  IMPRESSION: Multiple sclerosis. History is not consistent with relapsing-remitting form.  Possibly a very mild primary progressive MS.  It is stable.    PLAN: Continue vitamin D 4000 IU daily Follow up in 9 months.  26 minutes spent face to face with patient, over 50% spent discussing management.  Metta Clines, DO  CC:  Golden Pop, MD

## 2016-03-30 NOTE — Patient Instructions (Signed)
1.  Continue vitamin D 4000 IU daily 2.  Follow up in 9 months.

## 2016-04-09 ENCOUNTER — Other Ambulatory Visit: Payer: Self-pay | Admitting: Oncology

## 2016-04-10 ENCOUNTER — Telehealth: Payer: Self-pay

## 2016-04-10 NOTE — Telephone Encounter (Signed)
Patient returned call and stated that she is using [facility] now because of insurance. But she stated that she had enough medication to get to her appointment in August so we will fill medications then.

## 2016-04-10 NOTE — Telephone Encounter (Signed)
We got a fax requesting a refill for patient's pramipexole from [facility]. This medication was sent back in August for a year supply to [facility]. So I called and left patient a voicemail asking for her to please return my call to find out if she is needing this medication sent to [facility].

## 2016-04-13 ENCOUNTER — Other Ambulatory Visit: Payer: Self-pay | Admitting: *Deleted

## 2016-04-13 NOTE — Telephone Encounter (Signed)
Filled 5/18

## 2016-07-09 ENCOUNTER — Ambulatory Visit: Payer: BLUE CROSS/BLUE SHIELD | Admitting: Oncology

## 2016-07-09 ENCOUNTER — Other Ambulatory Visit: Payer: BLUE CROSS/BLUE SHIELD

## 2016-07-14 ENCOUNTER — Other Ambulatory Visit: Payer: BLUE CROSS/BLUE SHIELD

## 2016-07-14 ENCOUNTER — Ambulatory Visit: Payer: BLUE CROSS/BLUE SHIELD | Admitting: Oncology

## 2016-07-17 ENCOUNTER — Other Ambulatory Visit: Payer: Self-pay | Admitting: *Deleted

## 2016-07-17 DIAGNOSIS — Z853 Personal history of malignant neoplasm of breast: Secondary | ICD-10-CM

## 2016-07-20 ENCOUNTER — Inpatient Hospital Stay: Payer: BLUE CROSS/BLUE SHIELD | Attending: Oncology

## 2016-07-20 ENCOUNTER — Inpatient Hospital Stay (HOSPITAL_BASED_OUTPATIENT_CLINIC_OR_DEPARTMENT_OTHER): Payer: BLUE CROSS/BLUE SHIELD | Admitting: Oncology

## 2016-07-20 VITALS — BP 148/88 | HR 92 | Temp 98.6°F | Resp 18 | Wt 212.6 lb

## 2016-07-20 DIAGNOSIS — C50911 Malignant neoplasm of unspecified site of right female breast: Secondary | ICD-10-CM | POA: Diagnosis present

## 2016-07-20 DIAGNOSIS — G629 Polyneuropathy, unspecified: Secondary | ICD-10-CM | POA: Insufficient documentation

## 2016-07-20 DIAGNOSIS — Z170 Estrogen receptor positive status [ER+]: Secondary | ICD-10-CM | POA: Diagnosis not present

## 2016-07-20 DIAGNOSIS — Z79811 Long term (current) use of aromatase inhibitors: Secondary | ICD-10-CM | POA: Diagnosis not present

## 2016-07-20 DIAGNOSIS — Z87891 Personal history of nicotine dependence: Secondary | ICD-10-CM | POA: Insufficient documentation

## 2016-07-20 DIAGNOSIS — G35 Multiple sclerosis: Secondary | ICD-10-CM | POA: Insufficient documentation

## 2016-07-20 DIAGNOSIS — Z9221 Personal history of antineoplastic chemotherapy: Secondary | ICD-10-CM

## 2016-07-20 DIAGNOSIS — Z9013 Acquired absence of bilateral breasts and nipples: Secondary | ICD-10-CM

## 2016-07-20 DIAGNOSIS — R0681 Apnea, not elsewhere classified: Secondary | ICD-10-CM | POA: Diagnosis not present

## 2016-07-20 DIAGNOSIS — C50912 Malignant neoplasm of unspecified site of left female breast: Secondary | ICD-10-CM

## 2016-07-20 DIAGNOSIS — Z853 Personal history of malignant neoplasm of breast: Secondary | ICD-10-CM

## 2016-07-20 DIAGNOSIS — G2581 Restless legs syndrome: Secondary | ICD-10-CM | POA: Diagnosis not present

## 2016-07-20 LAB — CBC WITH DIFFERENTIAL/PLATELET
BASOS ABS: 0.1 10*3/uL (ref 0–0.1)
BASOS PCT: 1 %
EOS ABS: 0.3 10*3/uL (ref 0–0.7)
EOS PCT: 3 %
HCT: 44.4 % (ref 35.0–47.0)
Hemoglobin: 15.3 g/dL (ref 12.0–16.0)
Lymphocytes Relative: 27 %
Lymphs Abs: 2.4 10*3/uL (ref 1.0–3.6)
MCH: 31.9 pg (ref 26.0–34.0)
MCHC: 34.5 g/dL (ref 32.0–36.0)
MCV: 92.3 fL (ref 80.0–100.0)
MONO ABS: 0.9 10*3/uL (ref 0.2–0.9)
Monocytes Relative: 11 %
Neutro Abs: 5.2 10*3/uL (ref 1.4–6.5)
Neutrophils Relative %: 58 %
PLATELETS: 204 10*3/uL (ref 150–440)
RBC: 4.8 MIL/uL (ref 3.80–5.20)
RDW: 13.1 % (ref 11.5–14.5)
WBC: 8.9 10*3/uL (ref 3.6–11.0)

## 2016-07-20 LAB — COMPREHENSIVE METABOLIC PANEL
ALBUMIN: 4.4 g/dL (ref 3.5–5.0)
ALT: 33 U/L (ref 14–54)
AST: 28 U/L (ref 15–41)
Alkaline Phosphatase: 78 U/L (ref 38–126)
Anion gap: 7 (ref 5–15)
BUN: 15 mg/dL (ref 6–20)
CHLORIDE: 105 mmol/L (ref 101–111)
CO2: 24 mmol/L (ref 22–32)
Calcium: 9.1 mg/dL (ref 8.9–10.3)
Creatinine, Ser: 0.74 mg/dL (ref 0.44–1.00)
GFR calc Af Amer: >60 mL/min (ref >60)
GFR calc non Af Amer: >60 mL/min (ref >60)
GLUCOSE: 193 mg/dL — AB (ref 65–99)
POTASSIUM: 4 mmol/L (ref 3.5–5.1)
Sodium: 136 mmol/L (ref 135–145)
Total Bilirubin: 0.6 mg/dL (ref 0.3–1.2)
Total Protein: 7.2 g/dL (ref 6.5–8.1)

## 2016-07-20 NOTE — Progress Notes (Signed)
[facility]  [phone]  [phone]     Cassidy Norris DOB: 11-05-1951  MR#: [id number]  HWE#:[id number]  Patient Care Team: Guadalupe Maple, MD as PCP - General (Family Medicine) Seeplaputhur Robinette Haines, MD (General Surgery) Guadalupe Maple, MD (Family Medicine) Forest Gleason, MD (Oncology)  CHIEF COMPLAINT: Bilateral adenocarcinoma of the breast.  INTERVAL HISTORY:   Patient returns to clinic today for routine six-month follow-up. She was recently having leg cramps, but this resolved with potassium supplementation. She currently feels well and is asymptomatic. She has no neurologic complaints. She denies any recent fevers or illnesses. She has a good appetite and denies weight loss. She has no chest pain or shortness of breath. She denies any nausea, vomiting, constipation, or diarrhea. She has no urinary complaints. Patient feels at her baseline and offers no specific complaints today.   REVIEW OF SYSTEMS:   Review of Systems  Constitutional: Negative for fever, malaise/fatigue and weight loss.  Respiratory: Negative.  Negative for cough and shortness of breath.   Cardiovascular: Negative.  Negative for chest pain.  Gastrointestinal: Negative.  Negative for abdominal pain.  Genitourinary: Negative.   Musculoskeletal: Negative.   Neurological: Negative.  Negative for weakness.  Psychiatric/Behavioral: Negative.  The patient is not nervous/anxious.     As per HPI. Otherwise, a complete review of systems is negatve.  ONCOLOGY HISTORY: Oncology History   1. Bilateral carcinoma of breast, February, 2015. Right breast status post radical mastectomy. T1 C. N0M0 (isolated tumor cells in one lymph node) multifocal invasive cancer. Stage IC, Left breast, Status post radical mastectomy, T2, N1, M0 tumor. Stage II, RIght breast. Both tumors are estrogen receptor positive.  Progesterone receptor positive.  HER-2 receptor negative by FISH. Negative for BRCA  mutation.  2. Started on Adriamycin and Cytoxan on March 01, 2014. Last cycle of Cytoxan and Adriamycin on May 01, 2014. Patient has finished last chemotherapy on September 9. (12 th dose was omitted because of neuropathy) 3.  Taking tamoxifen.  Patient had a poor tolerance to letrozole.(October, 2015) 4.  Patient is taking letrozole since November of 2015 5.  May, 2016 Letrozole has been put on hold because of bony pains 6.  Started on Aromasin from July of 2016      Breast cancer bilateral, multifocal ER/PR pos, Her 2 neg,.   11/29/2013 Initial Diagnosis    Breast cancer bilateral, multifocal ER/PR pos, Her 2 neg,.       PAST MEDICAL HISTORY: Past Medical History:  Diagnosis Date  . Anemia   . Cancer [facility]) 2014   right breast - Invasive Mammary Carcinoma  . Cancer [facility]) 2014   left breast - Invasive lobular carcinoma and DCIS   . Diffuse cystic mastopathy   . Family history of malignant neoplasm of gastrointestinal tract 2012  . Fractured elbow 08/28/14  . Hx of metabolic acidosis with increased anion gap   . Increased heart rate   . Multiple sclerosis ([place])   . Neuromuscular disorder ([place])    neuropathy in bil feet - left is worse.  . Obesity, unspecified   . Peripheral neuropathy ([place])   . Personal history of tobacco use, presenting hazards to health   . Restless leg syndrome   . Sleep apnea   . Special screening for malignant neoplasms, colon     PAST SURGICAL HISTORY: Past Surgical History:  Procedure Laterality Date  . BREAST BIOPSY Right 1973,2001  . BREAST BIOPSY Right 11-07-13   INVASIVE MAMMARY  CARCINOMA , ER/PR positive Her2 negative  . BREAST BIOPSY Left 11-27-13   invasive lobular and DCIS  . BREAST SURGERY Bilateral 01/09/14   mastectomy  . CHOLECYSTECTOMY    . COLONOSCOPY  2010   Dr. Jamal Collin  . COLONOSCOPY WITH PROPOFOL N/A 06/11/2015   Procedure: COLONOSCOPY WITH PROPOFOL;  Surgeon: Christene Lye, MD;  Location: [facility] ENDOSCOPY;  Service:  Endoscopy;  Laterality: N/A;  . DILATION AND CURETTAGE OF UTERUS  1983  . POLYPECTOMY  1989  . PORTACATH PLACEMENT  2015  . SPINE SURGERY  09/14/14   Ruptured disk L4- L5  . TOTAL KNEE ARTHROPLASTY Left 11/22/2015   Procedure: TOTAL KNEE ARTHROPLASTY;  Surgeon: Earlie Server, MD;  Location: [facility];  Service: Orthopedics;  Laterality: Left;  . TUBAL LIGATION    . WISDOM TOOTH EXTRACTION  2006    FAMILY HISTORY Family History  Problem Relation Age of Onset  . Cancer Mother     lung  . Cancer Father     colon, stomach  . Diabetes Maternal Grandmother   . Rectal cancer Paternal Uncle   . Rectal cancer Paternal Uncle   . Hypertension Sister     GYNECOLOGIC HISTORY:  Patient's last menstrual period was 12/22/1999 (approximate).     ADVANCED DIRECTIVES:    HEALTH MAINTENANCE: Social History  Substance Use Topics  . Smoking status: Former Smoker    Packs/day: 1.00    Years: 30.00    Types: Cigarettes    Quit date: 12/11/2013  . Smokeless tobacco: Never Used  . Alcohol use No     Comment: social     Colonoscopy:  PAP:  Bone density:  Mammogram:  No Known Allergies  Current Outpatient Prescriptions  Medication Sig Dispense Refill  . Calcium Carbonate (CALCIUM 600 PO) Take 1 tablet by mouth 2 (two) times daily.    . carvedilol (COREG) 3.125 MG tablet Take 1 tablet (3.125 mg total) by mouth 2 (two) times daily with a meal. 180 tablet 3  . Cholecalciferol (VITAMIN D) 2000 UNITS tablet Take 4,000 Units by mouth daily.    . Cyanocobalamin (B-12) 5000 MCG SUBL Place 1 tablet under the tongue daily.    . diphenhydrAMINE (SOMINEX) 25 MG tablet Take 50 mg by mouth at bedtime.     Marland Kitchen exemestane (AROMASIN) 25 MG tablet TAKE 1 BY MOUTH DAILY AFTER BREAKFAST 90 tablet 0  . fluticasone (FLONASE) 50 MCG/ACT nasal spray Place 2 sprays into both nostrils at bedtime.   1  . pramipexole (MIRAPEX) 0.5 MG tablet Take 1 tablet (0.5 mg total) by mouth daily. (Patient taking differently: Take  0.5 mg by mouth at bedtime. ) 90 tablet 3  . traZODone (DESYREL) 50 MG tablet Take 1 tablet (50 mg total) by mouth at bedtime. 90 tablet 3   No current facility-administered medications for this visit.     OBJECTIVE: LMP 12/22/1999 (Approximate)    There is no height or weight on file to calculate BMI.    ECOG FS:0 - Asymptomatic  General: Well-developed, well-nourished, no acute distress. Eyes: Pink conjunctiva, anicteric sclera. Breasts: Patient has bilateral mastectomies. Lungs: Clear to auscultation bilaterally. Heart: Regular rate and rhythm. No rubs, murmurs, or gallops. Abdomen: Soft, nontender, nondistended. No organomegaly noted, normoactive bowel sounds. Musculoskeletal: No edema, cyanosis, or clubbing. Neuro: Alert, answering all questions appropriately. Cranial nerves grossly intact. Skin: No rashes or petechiae noted. Psych: Normal affect.   LAB RESULTS:  No visits with results within 3 Day(s) from this visit.  Latest known visit with results is:  Appointment on 01/10/2016  Component Date Value Ref Range Status  . WBC 01/10/2016 9.1  3.6 - 11.0 K/uL Final  . RBC 01/10/2016 4.79  3.80 - 5.20 MIL/uL Final  . Hemoglobin 01/10/2016 15.2  12.0 - 16.0 g/dL Final  . HCT 01/10/2016 44.5  35.0 - 47.0 % Final  . MCV 01/10/2016 92.9  80.0 - 100.0 fL Final  . MCH 01/10/2016 31.7  26.0 - 34.0 pg Final  . MCHC 01/10/2016 34.1  32.0 - 36.0 g/dL Final  . RDW 01/10/2016 13.2  11.5 - 14.5 % Final  . Platelets 01/10/2016 237  150 - 440 K/uL Final  . Neutrophils Relative % 01/10/2016 64  % Final  . Neutro Abs 01/10/2016 5.9  1.4 - 6.5 K/uL Final  . Lymphocytes Relative 01/10/2016 23  % Final  . Lymphs Abs 01/10/2016 2.1  1.0 - 3.6 K/uL Final  . Monocytes Relative 01/10/2016 9  % Final  . Monocytes Absolute 01/10/2016 0.8  0.2 - 0.9 K/uL Final  . Eosinophils Relative 01/10/2016 3  % Final  . Eosinophils Absolute 01/10/2016 0.2  0 - 0.7 K/uL Final  . Basophils Relative 01/10/2016 1   % Final  . Basophils Absolute 01/10/2016 0.1  0 - 0.1 K/uL Final  . Sodium 01/10/2016 137  135 - 145 mmol/L Final  . Potassium 01/10/2016 4.3  3.5 - 5.1 mmol/L Final  . Chloride 01/10/2016 105  101 - 111 mmol/L Final  . CO2 01/10/2016 26  22 - 32 mmol/L Final  . Glucose, Bld 01/10/2016 127* 65 - 99 mg/dL Final  . BUN 01/10/2016 16  6 - 20 mg/dL Final  . Creatinine, Ser 01/10/2016 0.73  0.44 - 1.00 mg/dL Final  . Calcium 01/10/2016 9.7  8.9 - 10.3 mg/dL Final  . Total Protein 01/10/2016 7.6  6.5 - 8.1 g/dL Final  . Albumin 01/10/2016 4.4  3.5 - 5.0 g/dL Final  . AST 01/10/2016 19  15 - 41 U/L Final  . ALT 01/10/2016 21  14 - 54 U/L Final  . Alkaline Phosphatase 01/10/2016 93  38 - 126 U/L Final  . Total Bilirubin 01/10/2016 0.6  0.3 - 1.2 mg/dL Final  . GFR calc non Af Amer 01/10/2016 >60  >60 mL/min Final  . GFR calc Af Amer 01/10/2016 >60  >60 mL/min Final   Comment: (NOTE) The eGFR has been calculated using the CKD EPI equation. This calculation has not been validated in all clinical situations. eGFR's persistently <60 mL/min signify possible Chronic Kidney Disease.   . Anion gap 01/10/2016 6  5 - 15 Final    STUDIES: No results found.  ASSESSMENT: Bilateral adenocarcinoma of the breast (right breast, stage I, T1c N0 M0 (isolated tumor cells in 1 lymph node; left breast, stage II, T2 N1 M0.)  PLAN:   1. Bilateral adenocarcinoma of the breast: Patient is status post bilateral radical mastectomies in February 2015. She also received adjuvant chemotherapy with Adriamycin, Cytoxan, and Taxol completed in approximately October 2015. She then initiated letrozole, but could not tolerate secondary to leg pain and was switched to Aromasin. Continue Aromasin completing in October 2021. Patient does not require mammograms given her bilateral mastectomy. Return to clinic in 6 months for routine evaluation. 2. Postmenopausal: Will get bone mineral density in the next 1-2 weeks. Patient has  been instructed to continue her calcium and vitamin D supplementation.   Patient expressed understanding and was in agreement with this plan. She  also understands that She can call clinic at any time with any questions, concerns, or complaints.   Breast cancer bilateral, multifocal ER/PR pos, Her 2 neg,.   Staging form: Breast, AJCC 7th Edition     Clinical: Stage IIB (T2, N1, M0) - Signed by Forest Gleason, MD on 04/22/2015   Lloyd Huger, MD   07/20/2016 12:12 AM

## 2016-07-20 NOTE — Progress Notes (Signed)
States had recent leg cramps and started OTC potassium which helped resolve leg cramps.

## 2016-07-22 ENCOUNTER — Encounter: Payer: BLUE CROSS/BLUE SHIELD | Admitting: Unknown Physician Specialty

## 2016-07-23 ENCOUNTER — Other Ambulatory Visit: Payer: Self-pay | Admitting: *Deleted

## 2016-07-23 ENCOUNTER — Ambulatory Visit (INDEPENDENT_AMBULATORY_CARE_PROVIDER_SITE_OTHER): Payer: BLUE CROSS/BLUE SHIELD | Admitting: Family Medicine

## 2016-07-23 ENCOUNTER — Encounter: Payer: Self-pay | Admitting: Family Medicine

## 2016-07-23 VITALS — BP 133/84 | HR 70 | Temp 98.7°F | Ht 65.75 in | Wt 210.2 lb

## 2016-07-23 DIAGNOSIS — E538 Deficiency of other specified B group vitamins: Secondary | ICD-10-CM | POA: Insufficient documentation

## 2016-07-23 DIAGNOSIS — G4700 Insomnia, unspecified: Secondary | ICD-10-CM

## 2016-07-23 DIAGNOSIS — G2581 Restless legs syndrome: Secondary | ICD-10-CM

## 2016-07-23 DIAGNOSIS — R000 Tachycardia, unspecified: Secondary | ICD-10-CM | POA: Diagnosis not present

## 2016-07-23 HISTORY — DX: Deficiency of other specified B group vitamins: E53.8

## 2016-07-23 MED ORDER — TRAZODONE HCL 50 MG PO TABS: 50.0000 mg | ORAL_TABLET | Freq: Every day | ORAL | 0 refills | Status: DC

## 2016-07-23 MED ORDER — CARVEDILOL 3.125 MG PO TABS: 3.1250 mg | ORAL_TABLET | Freq: Two times a day (BID) | ORAL | 3 refills | Status: DC

## 2016-07-23 MED ORDER — CARVEDILOL 3.125 MG PO TABS: 3.1250 mg | ORAL_TABLET | Freq: Two times a day (BID) | ORAL | 0 refills | Status: DC

## 2016-07-23 MED ORDER — EXEMESTANE 25 MG PO TABS: ORAL_TABLET | ORAL | 1 refills | Status: DC

## 2016-07-23 MED ORDER — PRAMIPEXOLE DIHYDROCHLORIDE 0.5 MG PO TABS: 0.5000 mg | ORAL_TABLET | Freq: Every day | ORAL | 3 refills | Status: DC

## 2016-07-23 MED ORDER — TRAZODONE HCL 50 MG PO TABS: 50.0000 mg | ORAL_TABLET | Freq: Every day | ORAL | 3 refills | Status: DC

## 2016-07-23 MED ORDER — PRAMIPEXOLE DIHYDROCHLORIDE 0.5 MG PO TABS: 0.5000 mg | ORAL_TABLET | Freq: Every day | ORAL | 0 refills | Status: DC

## 2016-07-23 NOTE — Assessment & Plan Note (Signed)
Fairly well controlled on current regimen, continue mirapex. Refills sent

## 2016-07-23 NOTE — Assessment & Plan Note (Signed)
Stable on trazodone, continue current regimen 

## 2016-07-23 NOTE — Assessment & Plan Note (Signed)
Under good control with coreg, refilled today. Continue current regimen

## 2016-07-23 NOTE — Progress Notes (Signed)
BP 133/84 (BP Location: Right Arm, Patient Position: Sitting, Cuff Size: Normal)   Pulse 70   Temp 98.7 F (37.1 C)   Ht 5' 5.75" (1.67 m)   Wt 210 lb 3.2 oz (95.3 kg)   LMP 12/22/1999 (Approximate)   SpO2 95%   BMI 34.19 kg/m    Subjective:    Patient ID: Cassidy Norris, female    DOB: 27-Apr-1951, 65 y.o.   MRN: [id number]  HPI: Cassidy JOSE is a 65 y.o. female  Chief Complaint  Patient presents with  . Follow-up  . Medication Management    Refill Trazadone, Coreg, Mirapex.    Patient presents today for medication management. States she was scheduled for a physical yesterday that was cancelled and she is now completely out of her medications. No concerns with the medications, taking them faithfully without side effects. Sleeping well on trazodone without feeling groggy the next day. Mirapex helping quite a bit with the restless legs though there is not full resolution. No CP, palpitations, SOB. Coreg controlling rate well.   Wanting to discuss her B12 deficiency as well. States she was diagnosed years ago and used to get injections monthly here, but about 4 years ago was told we don't carry them anymore here and has been taking oral supplements since. Wanting to make sure that is adequate so would like level checked. Feeling very run down so worried it's low.   Relevant past medical, surgical, family and social history reviewed and updated as indicated. Interim medical history since our last visit reviewed. Allergies and medications reviewed and updated.  Review of Systems  Constitutional: Positive for fatigue.  HENT: Negative.   Eyes: Negative.   Respiratory: Negative.   Cardiovascular: Negative.   Gastrointestinal: Negative.   Genitourinary: Negative.   Musculoskeletal: Negative.   Neurological: Negative.   Psychiatric/Behavioral: Negative.     Per HPI unless specifically indicated above     Objective:    BP 133/84 (BP Location: Right Arm, Patient  Position: Sitting, Cuff Size: Normal)   Pulse 70   Temp 98.7 F (37.1 C)   Ht 5' 5.75" (1.67 m)   Wt 210 lb 3.2 oz (95.3 kg)   LMP 12/22/1999 (Approximate)   SpO2 95%   BMI 34.19 kg/m   Wt Readings from Last 3 Encounters:  07/23/16 210 lb 3.2 oz (95.3 kg)  07/20/16 212 lb 10.1 oz (96.5 kg)  03/30/16 216 lb (98 kg)    Physical Exam  Constitutional: She is oriented to person, place, and time. She appears well-developed and well-nourished. No distress.  HENT:  Head: Atraumatic.  Eyes: Conjunctivae are normal. No scleral icterus.  Neck: Normal range of motion. Neck supple.  Cardiovascular: Normal rate, regular rhythm and normal heart sounds.   Pulmonary/Chest: Effort normal. No respiratory distress.  Musculoskeletal: Normal range of motion.  Neurological: She is alert and oriented to person, place, and time.  Skin: Skin is warm and dry.  Psychiatric: She has a normal mood and affect. Her behavior is normal.  Nursing note and vitals reviewed.   Results for orders placed or performed in visit on 07/20/16  CBC with Differential/Platelet  Result Value Ref Range   WBC 8.9 3.6 - 11.0 K/uL   RBC 4.80 3.80 - 5.20 MIL/uL   Hemoglobin 15.3 12.0 - 16.0 g/dL   HCT 44.4 35.0 - 47.0 %   MCV 92.3 80.0 - 100.0 fL   MCH 31.9 26.0 - 34.0 pg   MCHC 34.5 32.0 -  36.0 g/dL   RDW 13.1 11.5 - 14.5 %   Platelets 204 150 - 440 K/uL   Neutrophils Relative % 58 %   Neutro Abs 5.2 1.4 - 6.5 K/uL   Lymphocytes Relative 27 %   Lymphs Abs 2.4 1.0 - 3.6 K/uL   Monocytes Relative 11 %   Monocytes Absolute 0.9 0.2 - 0.9 K/uL   Eosinophils Relative 3 %   Eosinophils Absolute 0.3 0 - 0.7 K/uL   Basophils Relative 1 %   Basophils Absolute 0.1 0 - 0.1 K/uL  Comprehensive metabolic panel  Result Value Ref Range   Sodium 136 135 - 145 mmol/L   Potassium 4.0 3.5 - 5.1 mmol/L   Chloride 105 101 - 111 mmol/L   CO2 24 22 - 32 mmol/L   Glucose, Bld 193 (H) 65 - 99 mg/dL   BUN 15 6 - 20 mg/dL   Creatinine,  Ser 0.74 0.44 - 1.00 mg/dL   Calcium 9.1 8.9 - 10.3 mg/dL   Total Protein 7.2 6.5 - 8.1 g/dL   Albumin 4.4 3.5 - 5.0 g/dL   AST 28 15 - 41 U/L   ALT 33 14 - 54 U/L   Alkaline Phosphatase 78 38 - 126 U/L   Total Bilirubin 0.6 0.3 - 1.2 mg/dL   GFR calc non Af Amer >60 >60 mL/min   GFR calc Af Amer >60 >60 mL/min   Anion gap 7 5 - 15      Assessment & Plan:   Problem List Items Addressed This Visit      Digestive   B12 deficiency   Relevant Orders   B12     Other   Tachycardia    Under good control with coreg, refilled today. Continue current regimen      Relevant Medications   carvedilol (COREG) 3.125 MG tablet   RLS (restless legs syndrome)    Fairly well controlled on current regimen, continue mirapex. Refills sent      Insomnia - Primary    Stable on trazodone, continue current regimen.        Other Visit Diagnoses   None.   Will check B12 levels to determine if injections are needed again. Await results. Recent CBC at [facility]   Follow up plan: Return if symptoms worsen or fail to improve.

## 2016-07-23 NOTE — Patient Instructions (Signed)
Follow up as needed

## 2016-07-24 ENCOUNTER — Telehealth: Payer: Self-pay | Admitting: Family Medicine

## 2016-07-24 ENCOUNTER — Encounter: Payer: Self-pay | Admitting: Family Medicine

## 2016-07-24 LAB — VITAMIN B12

## 2016-07-24 NOTE — Telephone Encounter (Signed)
Please call pt and let her know that her B12 level came back over double the normal range and she should stop taking her supplements she's been taking. We can recheck in about 6 months to get a "true level" but I suspect that she will have sufficient levels naturally without these. Thanks

## 2016-07-24 NOTE — Telephone Encounter (Signed)
Patient notified

## 2016-08-18 ENCOUNTER — Ambulatory Visit
Admission: RE | Admit: 2016-08-18 | Discharge: 2016-08-18 | Disposition: A | Discharge status: Home / Self Care | Payer: BLUE CROSS/BLUE SHIELD | Source: Ambulatory Visit | Attending: Oncology | Admitting: Oncology

## 2016-08-18 DIAGNOSIS — Z780 Asymptomatic menopausal state: Secondary | ICD-10-CM | POA: Insufficient documentation

## 2016-08-18 DIAGNOSIS — C50911 Malignant neoplasm of unspecified site of right female breast: Secondary | ICD-10-CM | POA: Diagnosis not present

## 2016-08-18 DIAGNOSIS — C50912 Malignant neoplasm of unspecified site of left female breast: Secondary | ICD-10-CM | POA: Insufficient documentation

## 2016-08-18 DIAGNOSIS — M85852 Other specified disorders of bone density and structure, left thigh: Secondary | ICD-10-CM | POA: Diagnosis not present

## 2016-08-20 ENCOUNTER — Encounter: Payer: Self-pay | Admitting: *Deleted

## 2016-08-28 ENCOUNTER — Encounter: Payer: Self-pay | Admitting: Unknown Physician Specialty

## 2016-08-28 ENCOUNTER — Ambulatory Visit (INDEPENDENT_AMBULATORY_CARE_PROVIDER_SITE_OTHER): Payer: BLUE CROSS/BLUE SHIELD | Admitting: Unknown Physician Specialty

## 2016-08-28 VITALS — BP 142/85 | HR 111 | Temp 98.3°F | Ht 64.9 in | Wt 207.4 lb

## 2016-08-28 DIAGNOSIS — Z0000 Encounter for general adult medical examination without abnormal findings: Secondary | ICD-10-CM | POA: Diagnosis not present

## 2016-08-28 DIAGNOSIS — E538 Deficiency of other specified B group vitamins: Secondary | ICD-10-CM | POA: Diagnosis not present

## 2016-08-28 DIAGNOSIS — F5101 Primary insomnia: Secondary | ICD-10-CM | POA: Diagnosis not present

## 2016-08-28 MED ORDER — TRAZODONE HCL 50 MG PO TABS: 100.0000 mg | ORAL_TABLET | Freq: Every day | ORAL | 3 refills | Status: DC

## 2016-08-28 NOTE — Progress Notes (Addendum)
BP (!) 142/85 (BP Location: Left Arm, Patient Position: Sitting, Cuff Size: Large)   Pulse (!) 111   Temp 98.3 F (36.8 C)   Ht 5' 4.9" (1.648 m)   Wt 207 lb 6.4 oz (94.1 kg)   LMP 12/22/1999 (Approximate)   SpO2 96%   BMI 34.62 kg/m    Subjective:    Patient ID: Cassidy Norris, female    DOB: [birth year], 65 y.o.   MRN: [id number]  HPI: Cassidy Norris is a 65 y.o. female  Chief Complaint  Patient presents with  . Annual Exam   Insomnia Pt has trouble staying asleep and staying asleep.  She wonders if her Trazadone can be increased.  She sleeps about 3 hours and takes about 1 hours to go to sleep.  States she tosses and turns after 3 hours.  Drinks no caffeine.  She does not watch TV in bed but does use the computer before she goes to bed.  She is not exercising.    She also has a poor appetite.    Relevant past medical, surgical, family and social history reviewed and updated as indicated. Interim medical history since our last visit reviewed. Allergies and medications reviewed and updated.  Review of Systems  Constitutional: Positive for appetite change.       Poor appetite  HENT: Negative.   Eyes: Negative.   Respiratory: Negative.   Cardiovascular: Negative.   Gastrointestinal: Negative.   Endocrine: Negative.   Genitourinary: Negative.   Musculoskeletal: Negative.   Skin: Negative.   Allergic/Immunologic: Negative.   Neurological: Negative.   Hematological: Negative.   Psychiatric/Behavioral: Negative.     Per HPI unless specifically indicated above     Objective:    BP (!) 142/85 (BP Location: Left Arm, Patient Position: Sitting, Cuff Size: Large)   Pulse (!) 111   Temp 98.3 F (36.8 C)   Ht 5' 4.9" (1.648 m)   Wt 207 lb 6.4 oz (94.1 kg)   LMP 12/22/1999 (Approximate)   SpO2 96%   BMI 34.62 kg/m   Wt Readings from Last 3 Encounters:  08/28/16 207 lb 6.4 oz (94.1 kg)  07/23/16 210 lb 3.2 oz (95.3 kg)  07/20/16 212 lb 10.1 oz (96.5 kg)      Physical Exam  Constitutional: She is oriented to person, place, and time. She appears well-developed and well-nourished.  HENT:  Head: Normocephalic and atraumatic.  Eyes: Pupils are equal, round, and reactive to light. Right eye exhibits no discharge. Left eye exhibits no discharge. No scleral icterus.  Neck: Normal range of motion. Neck supple. Carotid bruit is not present. No thyromegaly present.  Cardiovascular: Normal rate, regular rhythm and normal heart sounds.  Exam reveals no gallop and no friction rub.   No murmur heard. Pulmonary/Chest: Effort normal and breath sounds normal. No respiratory distress. She has no wheezes. She has no rales.  Abdominal: Soft. Bowel sounds are normal. There is no tenderness. There is no rebound.  Genitourinary: No breast swelling, tenderness or discharge.  Musculoskeletal: Normal range of motion.  Lymphadenopathy:    She has no cervical adenopathy.  Neurological: She is alert and oriented to person, place, and time.  Skin: Skin is warm, dry and intact. No rash noted.  Psychiatric: She has a normal mood and affect. Her speech is normal and behavior is normal. Judgment and thought content normal. Cognition and memory are normal.    Results for orders placed or performed in visit on 08/28/16  HM PAP  SMEAR  Result Value Ref Range   HM Pap smear Abstracted from [facility]:   Problem List Items Addressed This Visit      Unprioritized   B12 deficiency   Relevant Orders   Vitamin B12   Insomnia    Increase Trazadone to 100 mg.         Other Visit Diagnoses    Routine general medical examination at a health care facility    -  Primary   Relevant Orders   CBC with Differential/Platelet   Comprehensive metabolic panel   Lipid Panel w/o Chol/HDL Ratio   TSH   VITAMIN D 25 Hydroxy (Vit-D Deficiency, Fractures)   Vitamin B12       Follow up plan: Return in about 1 year (around 08/28/2017).  Needs pap next year.

## 2016-08-28 NOTE — Patient Instructions (Addendum)
Insomnia is a frustrating problem and fortunately, for most, it can be managed with lifestyle changes.  I  find the most effective strategies seem to be exercise, decreasing caffeine and screen time.    In addition to the above, studies have shown that cognitive behavioral therapies for sleep are more effective than medications.  There are some less expensive on-line programs for this that are a self-paced 6 week program.  Go to shuti.com(used by sleep labs) or [employer].  There are others that are probably just as effective.    Take melatonin .5 to 1 mg an hour before bedtime with a Magnesium supplement.  I prefer Magnesium Citrate (I use something called Calm and Relax) which is better absorbed then the more common Magnesium Oxide.      Insomnia Insomnia is a sleep disorder that makes it difficult to fall asleep or to stay asleep. Insomnia can cause tiredness (fatigue), low energy, difficulty concentrating, mood swings, and poor performance at work or school.  There are three different ways to classify insomnia:  Difficulty falling asleep.  Difficulty staying asleep.  Waking up too early in the morning. Any type of insomnia can be long-term (chronic) or short-term (acute). Both are common. Short-term insomnia usually lasts for three months or less. Chronic insomnia occurs at least three times a week for longer than three months. CAUSES  Insomnia may be caused by another condition, situation, or substance, such as:  Anxiety.  Certain medicines.  Gastroesophageal reflux disease (GERD) or other gastrointestinal conditions.  Asthma or other breathing conditions.  Restless legs syndrome, sleep apnea, or other sleep disorders.  Chronic pain.  Menopause. This may include hot flashes.  Stroke.  Abuse of alcohol, tobacco, or illegal drugs.  Depression.  Caffeine.   Neurological disorders, such as Alzheimer disease.  An overactive thyroid (hyperthyroidism). The cause of  insomnia may not be known. RISK FACTORS Risk factors for insomnia include:  Gender. Women are more commonly affected than men.  Age. Insomnia is more common as you get older.  Stress. This may involve your professional or personal life.  Income. Insomnia is more common in people with lower income.  Lack of exercise.   Irregular work schedule or night shifts.  Traveling between different time zones. SIGNS AND SYMPTOMS If you have insomnia, trouble falling asleep or trouble staying asleep is the main symptom. This may lead to other symptoms, such as:  Feeling fatigued.  Feeling nervous about going to sleep.  Not feeling rested in the morning.  Having trouble concentrating.  Feeling irritable, anxious, or depressed. TREATMENT  Treatment for insomnia depends on the cause. If your insomnia is caused by an underlying condition, treatment will focus on addressing the condition. Treatment may also include:   Medicines to help you sleep.  Counseling or therapy.  Lifestyle adjustments. HOME CARE INSTRUCTIONS   Take medicines only as directed by your health care provider.  Keep regular sleeping and waking hours. Avoid naps.  Keep a sleep diary to help you and your health care provider figure out what could be causing your insomnia. Include:   When you sleep.  When you wake up during the night.  How well you sleep.   How rested you feel the next day.  Any side effects of medicines you are taking.  What you eat and drink.   Make your bedroom a comfortable place where it is easy to fall asleep:  Put up shades or special blackout curtains to block light from outside.  Use a white noise machine to block noise.  Keep the temperature cool.   Exercise regularly as directed by your health care provider. Avoid exercising right before bedtime.  Use relaxation techniques to manage stress. Ask your health care provider to suggest some techniques that may work well for  you. These may include:  Breathing exercises.  Routines to release muscle tension.  Visualizing peaceful scenes.  Cut back on alcohol, caffeinated beverages, and cigarettes, especially close to bedtime. These can disrupt your sleep.  Do not overeat or eat spicy foods right before bedtime. This can lead to digestive discomfort that can make it hard for you to sleep.  Limit screen use before bedtime. This includes:  Watching TV.  Using your smartphone, tablet, and computer.  Stick to a routine. This can help you fall asleep faster. Try to do a quiet activity, brush your teeth, and go to bed at the same time each night.  Get out of bed if you are still awake after 15 minutes of trying to sleep. Keep the lights down, but try reading or doing a quiet activity. When you feel sleepy, go back to bed.  Make sure that you drive carefully. Avoid driving if you feel very sleepy.  Keep all follow-up appointments as directed by your health care provider. This is important. SEEK MEDICAL CARE IF:   You are tired throughout the day or have trouble in your daily routine due to sleepiness.  You continue to have sleep problems or your sleep problems get worse. SEEK IMMEDIATE MEDICAL CARE IF:   You have serious thoughts about hurting yourself or someone else.   This information is not intended to replace advice given to you by your health care provider. Make sure you discuss any questions you have with your health care provider.   Document Released: 11/06/2000 Document Revised: 07/31/2015 Document Reviewed: 08/10/2014 Elsevier Interactive Patient Education [employer].

## 2016-08-28 NOTE — Assessment & Plan Note (Signed)
Increase Trazadone to 100 mg.

## 2016-08-29 LAB — CBC WITH DIFFERENTIAL/PLATELET
BASOS: 0 %
Basophils Absolute: 0 10*3/uL (ref 0.0–0.2)
EOS (ABSOLUTE): 0.3 10*3/uL (ref 0.0–0.4)
EOS: 3 %
HEMATOCRIT: 44.2 % (ref 34.0–46.6)
Hemoglobin: 15.3 g/dL (ref 11.1–15.9)
IMMATURE GRANS (ABS): 0 10*3/uL (ref 0.0–0.1)
IMMATURE GRANULOCYTES: 0 %
LYMPHS: 27 %
Lymphocytes Absolute: 2.6 10*3/uL (ref 0.7–3.1)
MCH: 32.3 pg (ref 26.6–33.0)
MCHC: 34.6 g/dL (ref 31.5–35.7)
MCV: 93 fL (ref 79–97)
MONOS ABS: 0.7 10*3/uL (ref 0.1–0.9)
Monocytes: 7 %
NEUTROS ABS: 6 10*3/uL (ref 1.4–7.0)
NEUTROS PCT: 63 %
Platelets: 242 10*3/uL (ref 150–379)
RBC: 4.74 x10E6/uL (ref 3.77–5.28)
RDW: 13.8 % (ref 12.3–15.4)
WBC: 9.5 10*3/uL (ref 3.4–10.8)

## 2016-08-29 LAB — COMPREHENSIVE METABOLIC PANEL
A/G RATIO: 1.6 (ref 1.2–2.2)
ALT: 25 IU/L (ref 0–32)
AST: 24 IU/L (ref 0–40)
Albumin: 4.3 g/dL (ref 3.6–4.8)
Alkaline Phosphatase: 80 IU/L (ref 39–117)
BUN / CREAT RATIO: 20 (ref 12–28)
BUN: 15 mg/dL (ref 8–27)
Bilirubin Total: 0.6 mg/dL (ref 0.0–1.2)
CALCIUM: 9.4 mg/dL (ref 8.7–10.3)
CO2: 20 mmol/L (ref 18–29)
Chloride: 103 mmol/L (ref 96–106)
Creatinine, Ser: 0.74 mg/dL (ref 0.57–1.00)
GFR, EST AFRICAN AMERICAN: 100 mL/min/{1.73_m2} (ref >59)
GFR, EST NON AFRICAN AMERICAN: 86 mL/min/{1.73_m2} (ref >59)
GLOBULIN, TOTAL: 2.7 g/dL (ref 1.5–4.5)
Glucose: 196 mg/dL — ABNORMAL HIGH (ref 65–99)
POTASSIUM: 3.9 mmol/L (ref 3.5–5.2)
SODIUM: 141 mmol/L (ref 134–144)
TOTAL PROTEIN: 7 g/dL (ref 6.0–8.5)

## 2016-08-29 LAB — LIPID PANEL W/O CHOL/HDL RATIO
Cholesterol, Total: 171 mg/dL (ref 100–199)
HDL: 28 mg/dL — AB (ref >39)
LDL Calculated: 64 mg/dL (ref 0–99)
Triglycerides: 396 mg/dL — ABNORMAL HIGH (ref 0–149)
VLDL Cholesterol Cal: 79 mg/dL — ABNORMAL HIGH (ref 5–40)

## 2016-08-29 LAB — VITAMIN D 25 HYDROXY (VIT D DEFICIENCY, FRACTURES): VIT D 25 HYDROXY: 76 ng/mL (ref 30.0–100.0)

## 2016-08-29 LAB — TSH: TSH: 0.772 u[IU]/mL (ref 0.450–4.500)

## 2016-08-29 LAB — VITAMIN B12: VITAMIN B 12: 1369 pg/mL — AB (ref 211–946)

## 2016-09-01 ENCOUNTER — Telehealth: Payer: Self-pay | Admitting: Unknown Physician Specialty

## 2016-09-01 DIAGNOSIS — R7301 Impaired fasting glucose: Secondary | ICD-10-CM

## 2016-09-01 NOTE — Telephone Encounter (Signed)
Discussed with pt non fasting.  Needs Hgb A1C

## 2016-09-02 ENCOUNTER — Telehealth: Payer: Self-pay | Admitting: Unknown Physician Specialty

## 2016-09-02 DIAGNOSIS — E1169 Type 2 diabetes mellitus with other specified complication: Secondary | ICD-10-CM | POA: Insufficient documentation

## 2016-09-02 DIAGNOSIS — E119 Type 2 diabetes mellitus without complications: Secondary | ICD-10-CM

## 2016-09-02 NOTE — Progress Notes (Signed)
Patient notified of results by phone.

## 2016-09-02 NOTE — Telephone Encounter (Signed)
Discussed with patient diabetes diagnosis with Hgb A1C of 6.6.  Recommended lifestyle changes.  Refer to the lifestyle center.  Will stop so many sweets and try to eat better

## 2016-09-05 LAB — SPECIMEN STATUS REPORT

## 2016-09-05 LAB — HGB A1C W/O EAG: HEMOGLOBIN A1C: 6.6 % — AB (ref 4.8–5.6)

## 2016-09-21 ENCOUNTER — Telehealth: Payer: Self-pay | Admitting: Unknown Physician Specialty

## 2016-09-21 ENCOUNTER — Encounter: Payer: BLUE CROSS/BLUE SHIELD | Attending: Unknown Physician Specialty | Admitting: *Deleted

## 2016-09-21 ENCOUNTER — Encounter: Payer: Self-pay | Admitting: *Deleted

## 2016-09-21 VITALS — BP 140/90 | Ht 65.0 in | Wt 209.6 lb

## 2016-09-21 DIAGNOSIS — E119 Type 2 diabetes mellitus without complications: Secondary | ICD-10-CM | POA: Diagnosis present

## 2016-09-21 NOTE — Patient Instructions (Signed)
Check blood sugars 2 x day before breakfast and 2 hrs after supper 3-4 x week  Exercise: Walk as tolerated   Eat 3 meals day,  1  snack a day Space meals 4-6 hours apart Don't skip meals  Bring blood sugar records to the next appointment/class  Call your doctor for a prescription for:  1. Meter strips (type) Contour Next   checking  3-4 times per week  2. Lancets (type) Contour Microlet checking  3-4  times per week  Call back to schedule another appointment with nurse or dietitian or to schedule classes

## 2016-09-21 NOTE — Telephone Encounter (Signed)
Order form faxed to pharmacy for strips and lancets.

## 2016-09-21 NOTE — Telephone Encounter (Signed)
Form filled out. Will get provider signature and fax to pharmacy.

## 2016-09-21 NOTE — Progress Notes (Signed)
Diabetes Self-Management Education  Visit Type: First/Initial  Appt. Start Time: 0900 Appt. End Time: 1030  09/21/2016  [name], identified by name and date of birth, is a 65 y.o. female with a diagnosis of Diabetes: Type 2.   ASSESSMENT  Blood pressure 140/90, height 5\' 5"  (1.651 m), weight 209 lb 9.6 oz (95.1 kg), last menstrual period 12/22/1999. Body mass index is 34.88 kg/m.      Diabetes Self-Management Education - 09/21/16 1113      Visit Information   Visit Type First/Initial     Initial Visit   Diabetes Type Type 2   Are you currently following a meal plan? No  "drinking Slim Fast"   Are you taking your medications as prescribed? Yes   Date Diagnosed Pt isn't convinced that she has diabetes. A1C per this month was 6.6 %.     Health Coping   How would you rate your overall health? Good     Psychosocial Assessment   Patient Belief/Attitude about Diabetes Denial  "I am not dealing with it"   Self-care barriers None   Self-management support Family   Patient Concerns Nutrition/Meal planning;Weight Control   Special Needs None   Preferred Learning Style Hands on   Learning Readiness Contemplating   How often do you need to have someone help you when you read instructions, pamphlets, or other written materials from your doctor or pharmacy? 1 - Never     Pre-Education Assessment   Patient understands the diabetes disease and treatment process. Needs Instruction   Patient understands incorporating nutritional management into lifestyle. Needs Instruction   Patient undertands incorporating physical activity into lifestyle. Needs Instruction   Patient understands using medications safely. Needs Instruction   Patient understands monitoring blood glucose, interpreting and using results Needs Instruction   Patient understands prevention, detection, and treatment of acute complications. Needs Instruction   Patient understands prevention, detection, and  treatment of chronic complications. Needs Instruction   Patient understands how to develop strategies to address psychosocial issues. Needs Instruction   Patient understands how to develop strategies to promote health/change behavior. Needs Instruction     Complications   Last HgB A1C per patient/outside source 6.6 %  08/28/16   How often do you check your blood sugar? 0 times/day (not testing)  Provided Contour Next meter and instructed on use. BG upon return demonstration was 134 mg/dL at 10:20 am - fasting.    Have you had a dilated eye exam in the past 12 months? Yes   Have you had a dental exam in the past 12 months? Yes   Are you checking your feet? No     Dietary Intake   Breakfast Slim Fast shake   Lunch salad, peanut butter and pretzels, carrots, dried cranberries, nuts   Dinner skips   Beverage(s) water     Exercise   Exercise Type ADL's     Patient Education   Previous Diabetes Education No   Disease state  Factors that contribute to the development of diabetes   Nutrition management  Role of diet in the treatment of diabetes and the relationship between the three main macronutrients and blood glucose level;Carbohydrate counting   Physical activity and exercise  Role of exercise on diabetes management, blood pressure control and cardiac health.   Monitoring Taught/evaluated SMBG meter.;Purpose and frequency of SMBG.;Taught/discussed recording of test results and interpretation of SMBG.;Identified appropriate SMBG and/or A1C goals.   Chronic complications Relationship between chronic complications and blood glucose control  Psychosocial adjustment Identified and addressed patients feelings and concerns about diabetes     Individualized Goals (developed by patient)   Reducing Risk Lose weight     Outcomes   Expected Outcomes Demonstrated interest in learning. Expect positive outcomes   Future DMSE 2 wks      Individualized Plan for Diabetes Self-Management Training:    Learning Objective:  Patient will have a greater understanding of diabetes self-management. Patient education plan is to attend individual and/or group sessions per assessed needs and concerns.   Plan:   Patient Instructions  Check blood sugars 2 x day before breakfast and 2 hrs after supper 3-4 x week Exercise: Walk as tolerated  Eat 3 meals day,  1  snack a day Space meals 4-6 hours apart Don't skip meals Bring blood sugar records to the next appointment/class Call your doctor for a prescription for:  1. Meter strips (type) Contour Next   checking  3-4 times per week  2. Lancets (type) Contour Microlet checking  3-4  times per week Call back to schedule another appointment with nurse or dietitian or to schedule classes   Expected Outcomes:  Demonstrated interest in learning. Expect positive outcomes  Education material provided:  General Meal Planning Guidelines Simple Meal Plan Meter = Contour Next  If problems or questions, patient to contact team via:  Cassidy Norris, [place], CCM, CDE [phone]  Future DSME appointment: 2 wks  Pt had not decided on classes at completion of visit. She called during documentation after she called her insurance plan regarding coverage and will return for diabetes classes on Nov 16.

## 2016-09-21 NOTE — Telephone Encounter (Signed)
Pt went to the [facility] today. They gave her a glucose meter. Pt needs an RX for strips and lancets for Contour Next meter. Contour microlet lancets. Checking sugar levels 3-4 weekly. Pharm is Paediatric nurse on [address] Thanks.

## 2016-10-08 ENCOUNTER — Encounter: Payer: Self-pay | Admitting: Dietician

## 2016-10-08 ENCOUNTER — Encounter: Payer: BLUE CROSS/BLUE SHIELD | Attending: Unknown Physician Specialty | Admitting: Dietician

## 2016-10-08 VITALS — Ht 65.0 in | Wt 207.6 lb

## 2016-10-08 DIAGNOSIS — E119 Type 2 diabetes mellitus without complications: Secondary | ICD-10-CM | POA: Insufficient documentation

## 2016-10-08 NOTE — Progress Notes (Signed)

## 2016-10-09 ENCOUNTER — Telehealth: Payer: Self-pay

## 2016-10-09 ENCOUNTER — Telehealth: Payer: Self-pay | Admitting: Family Medicine

## 2016-10-09 NOTE — Telephone Encounter (Signed)
RX for testing supplies faxed to pharmacy.

## 2016-10-09 NOTE — Telephone Encounter (Signed)
Patient called in stating that the pharmacy would not fill her test strips and lancets for some reason. Called pharmacy and they state that the patient was given a 50 day supply on October 30th so she should not be out yet based on the directions of the prescription of testing once daily. Tried calling patient to clarify how often she is checking blood sugar and to ask her about the strips. Left patient a VM asking for her to please return my call.

## 2016-10-09 NOTE — Telephone Encounter (Signed)
Patent returned call. She stated that she is actually testing her sugar 2 to 3 times per day, 3 to 4 days per week. I apologized to her for this mix up on the first encounter. Patient stated that she needs a rx for strips, lancets, and needles sent in to [facility]. Will fill out form, have provider sign, and fax to pharmacy.

## 2016-10-09 NOTE — Telephone Encounter (Signed)
See other phone note

## 2016-10-12 ENCOUNTER — Encounter: Payer: Self-pay | Admitting: General Surgery

## 2016-10-12 ENCOUNTER — Ambulatory Visit (INDEPENDENT_AMBULATORY_CARE_PROVIDER_SITE_OTHER): Payer: BLUE CROSS/BLUE SHIELD | Admitting: General Surgery

## 2016-10-12 VITALS — BP 140/80 | HR 74 | Resp 12 | Ht 67.0 in | Wt 207.0 lb

## 2016-10-12 DIAGNOSIS — C50912 Malignant neoplasm of unspecified site of left female breast: Secondary | ICD-10-CM | POA: Diagnosis not present

## 2016-10-12 DIAGNOSIS — C50911 Malignant neoplasm of unspecified site of right female breast: Secondary | ICD-10-CM

## 2016-10-12 DIAGNOSIS — Z800 Family history of malignant neoplasm of digestive organs: Secondary | ICD-10-CM

## 2016-10-12 NOTE — Patient Instructions (Signed)
Return in one year for breast cancer check.

## 2016-10-12 NOTE — Progress Notes (Signed)
Patient ID: Cassidy Norris, female   DOB: [birth year], 65 y.o.   MRN: [id number]  Chief Complaint  Patient presents with  . Breast Cancer    HPI Cassidy Norris is a 65 y.o. female here today for her one year follow up breast cancer check. History of bilateral breast cancer and bilateral mastectomy. No new breast symptoms. Currently taking Aromasin, tolerating well.  I have reviewed the history of present illness with the patient.  HPI  Past Medical History:  Diagnosis Date  . Anemia   . Cancer [facility]) 2014   right breast - Invasive Mammary Carcinoma  . Cancer [facility]) 2014   left breast - Invasive lobular carcinoma and DCIS   . Diabetes mellitus without complication ([place])   . Diffuse cystic mastopathy   . Family history of malignant neoplasm of gastrointestinal tract 2012  . Fractured elbow 08/28/14  . Hx of metabolic acidosis with increased anion gap   . Increased heart rate   . Multiple sclerosis ([place])   . Neuromuscular disorder ([place])    neuropathy in bil feet - left is worse.  . Obesity, unspecified   . Peripheral neuropathy ([place])   . Personal history of tobacco use, presenting hazards to health   . Restless leg syndrome   . Sleep apnea   . Special screening for malignant neoplasms, colon     Past Surgical History:  Procedure Laterality Date  . BREAST BIOPSY Right 1973,2001  . BREAST BIOPSY Right 11-07-13   INVASIVE MAMMARY CARCINOMA , ER/PR positive Her2 negative  . BREAST BIOPSY Left 11-27-13   invasive lobular and DCIS  . BREAST SURGERY Bilateral 01/09/14   mastectomy  . CHOLECYSTECTOMY    . COLONOSCOPY  2010   Dr. Jamal Collin  . COLONOSCOPY WITH PROPOFOL N/A 06/11/2015   Procedure: COLONOSCOPY WITH PROPOFOL;  Surgeon: Christene Lye, MD;  Location: [facility] ENDOSCOPY;  Service: Endoscopy;  Laterality: N/A;  . DILATION AND CURETTAGE OF UTERUS  1983  . POLYPECTOMY  1989  . PORTACATH PLACEMENT  2015  . SPINE SURGERY  09/14/14   Ruptured disk L4- L5  . TOTAL KNEE  ARTHROPLASTY Left 11/22/2015   Procedure: TOTAL KNEE ARTHROPLASTY;  Surgeon: Earlie Server, MD;  Location: [facility];  Service: Orthopedics;  Laterality: Left;  . TUBAL LIGATION    . WISDOM TOOTH EXTRACTION  2006    Family History  Problem Relation Age of Onset  . Cancer Mother     lung  . Cancer Father     colon, stomach  . Diabetes Maternal Grandmother   . Hypertension Sister   . Rectal cancer Paternal Uncle   . Rectal cancer Paternal Uncle     Social History Social History  Substance Use Topics  . Smoking status: Former Smoker    Packs/day: 1.00    Years: 30.00    Types: Cigarettes    Quit date: 12/11/2013  . Smokeless tobacco: Never Used  . Alcohol use No     Comment: social    No Known Allergies  Current Outpatient Prescriptions  Medication Sig Dispense Refill  . acetaminophen (TYLENOL) 325 MG tablet Take 650 mg by mouth every 6 (six) hours as needed.    . Alum & Mag Hydroxide-Simeth (FLANAX HEARTBURN RELIEF PO) Take by mouth daily.    Marland Kitchen amoxicillin (AMOXIL) 500 MG capsule   0  . Calcium Carbonate (CALCIUM 600 PO) Take 1 tablet by mouth 2 (two) times daily.    . carvedilol (COREG) 3.125 MG tablet Take  1 tablet (3.125 mg total) by mouth 2 (two) times daily with a meal. 60 tablet 0  . Cholecalciferol (VITAMIN D) 2000 UNITS tablet Take 4,000 Units by mouth daily.    Marland Kitchen exemestane (AROMASIN) 25 MG tablet TAKE 1 BY MOUTH DAILY AFTER BREAKFAST 90 tablet 1  . Ferrous Gluconate (IRON 27 PO) Take 1 tablet by mouth daily.    . fluticasone (FLONASE) 50 MCG/ACT nasal spray Place 2 sprays into both nostrils at bedtime.   1  . Magnesium 100 MG CAPS Take 1 capsule by mouth daily.    . meloxicam (MOBIC) 15 MG tablet Take 15 mg by mouth daily.  2  . Potassium 99 MG TABS Take 1 tablet by mouth daily.    . pramipexole (MIRAPEX) 0.5 MG tablet Take 1 tablet (0.5 mg total) by mouth at bedtime. 30 tablet 0  . traZODone (DESYREL) 50 MG tablet Take 2 tablets (100 mg total) by mouth at bedtime.  180 tablet 3   No current facility-administered medications for this visit.     Review of Systems Review of Systems  Constitutional: Negative.   Respiratory: Negative.   Cardiovascular: Negative.     Blood pressure 140/80, pulse 74, resp. rate 12, height 5' 7"  (1.702 m), weight 207 lb (93.9 kg), last menstrual period 12/22/1999.  Physical Exam Physical Exam  Constitutional: She is oriented to person, place, and time. She appears well-developed and well-nourished.  Eyes: Conjunctivae are normal. No scleral icterus.  Neck: Neck supple.  Cardiovascular: Normal rate, regular rhythm and normal heart sounds.   Pulmonary/Chest: Effort normal and breath sounds normal.  Bilateral mastectomy incisons well healed. No signs of local recurrence.   Abdominal: Soft. Bowel sounds are normal. There is no tenderness.  Lymphadenopathy:    She has no cervical adenopathy.    She has no axillary adenopathy.  Neurological: She is alert and oriented to person, place, and time.  Skin: Skin is warm and dry.    Data Reviewed Notes  Assessment    Bilateral breast cancer, s/p bilateral mastectomy, currently on Aromasin  FH of colon cancer. Colonoscopy completed last yr  Plan     Return in one year for breast cancer check.   This information has been scribed by Gaspar Cola CMA.  Kaipo Ardis G 10/14/2016, 8:28 AM

## 2016-10-14 ENCOUNTER — Encounter: Payer: Self-pay | Admitting: General Surgery

## 2016-10-22 ENCOUNTER — Encounter: Payer: BLUE CROSS/BLUE SHIELD | Admitting: *Deleted

## 2016-10-22 ENCOUNTER — Encounter: Payer: Self-pay | Admitting: *Deleted

## 2016-10-22 VITALS — Wt 208.3 lb

## 2016-10-22 DIAGNOSIS — E119 Type 2 diabetes mellitus without complications: Secondary | ICD-10-CM | POA: Diagnosis not present

## 2016-10-22 NOTE — Progress Notes (Signed)

## 2016-10-29 ENCOUNTER — Encounter: Payer: Self-pay | Admitting: Dietician

## 2016-10-29 ENCOUNTER — Encounter: Payer: BLUE CROSS/BLUE SHIELD | Attending: Unknown Physician Specialty | Admitting: Dietician

## 2016-10-29 VITALS — BP 130/84 | Ht 65.0 in | Wt 207.7 lb

## 2016-10-29 DIAGNOSIS — E119 Type 2 diabetes mellitus without complications: Secondary | ICD-10-CM

## 2016-10-29 NOTE — Progress Notes (Signed)

## 2016-11-02 ENCOUNTER — Encounter: Payer: Self-pay | Admitting: *Deleted

## 2016-11-06 ENCOUNTER — Telehealth: Payer: Self-pay | Admitting: *Deleted

## 2016-11-06 NOTE — Telephone Encounter (Signed)
Received voice mail from patient. She had questions about exercise and blood sugar. Called her back and she reports BG before exercise was 150 and post exercise was 111. She questioned if this was low. Discussed goals for blood sugars and that low blood sugars were usually less than 70 mg/dL. She denies any signs hypoglycemia. Discussed how exercise helps to lower blood sugars also.

## 2016-12-22 ENCOUNTER — Encounter: Payer: Self-pay | Admitting: Unknown Physician Specialty

## 2017-01-01 ENCOUNTER — Ambulatory Visit: Payer: BLUE CROSS/BLUE SHIELD | Admitting: Neurology

## 2017-01-04 ENCOUNTER — Ambulatory Visit: Payer: BLUE CROSS/BLUE SHIELD | Admitting: Neurology

## 2017-01-06 ENCOUNTER — Other Ambulatory Visit: Payer: Self-pay | Admitting: *Deleted

## 2017-01-06 MED ORDER — EXEMESTANE 25 MG PO TABS
ORAL_TABLET | ORAL | 1 refills | Status: DC
Start: 2017-01-06 — End: 2017-07-20

## 2017-01-07 ENCOUNTER — Telehealth: Payer: Self-pay | Admitting: Family Medicine

## 2017-01-07 NOTE — Telephone Encounter (Signed)
Pt is under Cheryl's care.

## 2017-01-07 NOTE — Telephone Encounter (Signed)
Patient will be by Tues morning 8:30 for labs for her A1C. Please have orders in for this.  I am not sure if the patient sees Malachy Mood or Dr Jeananne Rama so I sent to both CMAs   Thanks  Santiago Glad

## 2017-01-07 NOTE — Telephone Encounter (Signed)
Routing to provider, see message below.  

## 2017-01-08 ENCOUNTER — Ambulatory Visit (INDEPENDENT_AMBULATORY_CARE_PROVIDER_SITE_OTHER): Payer: BLUE CROSS/BLUE SHIELD | Admitting: Neurology

## 2017-01-08 ENCOUNTER — Encounter: Payer: Self-pay | Admitting: Neurology

## 2017-01-08 VITALS — BP 118/70 | HR 84 | Wt 201.6 lb

## 2017-01-08 DIAGNOSIS — M5416 Radiculopathy, lumbar region: Secondary | ICD-10-CM | POA: Diagnosis not present

## 2017-01-08 DIAGNOSIS — G35 Multiple sclerosis: Secondary | ICD-10-CM | POA: Diagnosis not present

## 2017-01-08 NOTE — Telephone Encounter (Signed)
Tried to reach the patient. Left a VM for patient to call me back  Cassidy Norris

## 2017-01-08 NOTE — Patient Instructions (Signed)
1.  Continue vitamin D3 4000 IU daily 2.  We will repeat MRI of brain with and without contrast.  If it looks progressed compared to 2016, then I will have you follow up to discuss next steps (such as treatment).  Otherwise, follow up in one year.

## 2017-01-08 NOTE — Telephone Encounter (Signed)
There is already one in the chart.  This is not a test I want to do without an office visit

## 2017-01-08 NOTE — Progress Notes (Signed)
NEUROLOGY FOLLOW UP OFFICE NOTE  Cassidy Norris [id number]  HISTORY OF PRESENT ILLNESS: Cassidy Norris is a 66 year old right-handed woman with multiple sclerosis, former smoker, pre-diabetes, status post bilateral breast cancer March 03, 2014 status post bilateral mastectomy and status post L4-5 laminectomy who follows up for multiple sclerosis.  She is accompanied by her sister who provides some history.     UPDATE: She is not on a disease-modifying agent.  She takes vitamin D 4000 u daily.  Overall, she is doing well.  Sometimes she feels unsteady on her feet, such as turning.  It usually occurs when she bears weight on the left foot.  The toes of her left foot are numb and sometimes curl.  Labs from 08/28/16:  D 76; TSH 0.772; B12 1,369; Hgb A1c 6.6.   HISTORY: Her husband passed away due to [place] in Mar 04, 1999.  In November 2001, she tripped and fell, hitting her head.  She went to the ED where a CT of the head was reportedly unremarkable.  She was advised to have an MRI of the brain, which was performed on 12/12/00.  It revealed small round and elliptical white matter lesions less than 1 cm in the periventricular region.  In December of 2002, she followed up with a neurologist, Dr. Corinna Capra.  VEP performed in 03-Mar-2002 showed prolonged p100 latency in the right optic nerve.  BAER was normal.  She reportedly had a lumbar puncture where CSF results were supportive of MS.  She was given 5 days of Solu-Medrol followed by 3 weeks of oral prednisone.  She was advised to start an interferon, but due to possible side effects, she decided to hold off and see if she clinically progresses.  She did have a follow up MRI of the brain in December 2004, which reportedly showed an enhancing lesion as well as a remote lesion in the cerebellum in addition to the periventricular white matter lesions.  She never had any noticeable clinical flare-ups and had done well off of a disease-modifying agent.  Over the past 3 years, she notes  possible worsening in depth perception.  Sometimes she notes difficulty grabbing for objects or will drive too close to the curb.   She had an MRI of the brain and cervical spine with and without contrast on 03/12/15, which showed non-enhancing bilateral periventricular white matter lesions oriented perpendicular to the ventricles and corpus callosum, as well as involving temporal lobe white matter and mild involvement of the left side of cerebellum.  No cervical cord lesions were seen.     In 03-Mar-2014, she was diagnosed with bilateral breast cancer.  She underwent bilateral mastectomy and chemotherapy, which ended in September.  As a result of the chemotherapy, she has neuropathy in the feet.  She also had problems with her left leg and was found to have a ruptured disc at L4-5 and underwent surgery in October 2015.  She has baseline numbness over the dorsum of left foot and lateral leg.  The toes in her left foot sometimes curl.  Since chemotherapy, she notes some mild memory problems.  She sometimes misplaces objects or will forget if she locked the front door when she left.  She will sometimes have difficulty getting a word out.  PAST MEDICAL HISTORY: Past Medical History:  Diagnosis Date  . Anemia   . Cancer [facility]) Mar 03, 2013   right breast - Invasive Mammary Carcinoma  . Cancer [facility]) 03/03/2013   left breast - Invasive lobular carcinoma and DCIS   .  Diabetes mellitus without complication ([place])   . Diffuse cystic mastopathy   . Family history of malignant neoplasm of gastrointestinal tract 2012  . Fractured elbow 08/28/14  . Hx of metabolic acidosis with increased anion gap   . Increased heart rate   . Multiple sclerosis ([place])   . Neuromuscular disorder ([place])    neuropathy in bil feet - left is worse.  . Obesity, unspecified   . Peripheral neuropathy ([place])   . Personal history of tobacco use, presenting hazards to health   . Restless leg syndrome   . Sleep apnea   . Special screening for malignant  neoplasms, colon     MEDICATIONS: Current Outpatient Prescriptions on File Prior to Visit  Medication Sig Dispense Refill  . acetaminophen (TYLENOL) 325 MG tablet Take 650 mg by mouth every 6 (six) hours as needed.    . Alum & Mag Hydroxide-Simeth (FLANAX HEARTBURN RELIEF PO) Take by mouth daily.    . Calcium Carbonate (CALCIUM 600 PO) Take 1 tablet by mouth 2 (two) times daily.    . carvedilol (COREG) 3.125 MG tablet Take 1 tablet (3.125 mg total) by mouth 2 (two) times daily with a meal. 60 tablet 0  . Cholecalciferol (VITAMIN D) 2000 UNITS tablet Take 4,000 Units by mouth daily.    . diphenhydrAMINE (BENADRYL) 25 MG tablet Take 50 mg by mouth at bedtime as needed.    Marland Kitchen exemestane (AROMASIN) 25 MG tablet TAKE 1 BY MOUTH DAILY AFTER BREAKFAST 90 tablet 1  . fluticasone (FLONASE) 50 MCG/ACT nasal spray Place 2 sprays into both nostrils at bedtime.   1  . meloxicam (MOBIC) 15 MG tablet Take 15 mg by mouth daily.  2  . pramipexole (MIRAPEX) 0.5 MG tablet Take 1 tablet (0.5 mg total) by mouth at bedtime. 30 tablet 0  . traZODone (DESYREL) 50 MG tablet Take 2 tablets (100 mg total) by mouth at bedtime. 180 tablet 3  . Ferrous Gluconate (IRON 27 PO) Take 1 tablet by mouth daily.    . Magnesium 100 MG CAPS Take 1 capsule by mouth daily.    . Potassium 99 MG TABS Take 1 tablet by mouth daily.     No current facility-administered medications on file prior to visit.     ALLERGIES: No Known Allergies  FAMILY HISTORY: Family History  Problem Relation Age of Onset  . Cancer Mother     lung  . Cancer Father     colon, stomach  . Diabetes Maternal Grandmother   . Hypertension Sister   . Rectal cancer Paternal Uncle   . Rectal cancer Paternal Uncle     SOCIAL HISTORY: Social History   Social History  . Marital status: Widowed    Spouse name: N/A  . Number of children: N/A  . Years of education: N/A   Occupational History  . Not on file.   Social History Main Topics  . Smoking  status: Former Smoker    Packs/day: 1.00    Years: 30.00    Types: Cigarettes    Quit date: 12/11/2013  . Smokeless tobacco: Never Used  . Alcohol use No     Comment: social  . Drug use: No  . Sexual activity: No   Other Topics Concern  . Not on file   Social History Narrative  . No narrative on file    REVIEW OF SYSTEMS: Constitutional: No fevers, chills, or sweats, no generalized fatigue, change in appetite Eyes: No visual changes, double vision, eye pain  Ear, nose and throat: No hearing loss, ear pain, nasal congestion, sore throat Cardiovascular: No chest pain, palpitations Respiratory:  No shortness of breath at rest or with exertion, wheezes GastrointestinaI: No nausea, vomiting, diarrhea, abdominal pain, fecal incontinence Genitourinary:  No dysuria, urinary retention or frequency Musculoskeletal:  No neck pain, back pain Integumentary: No rash, pruritus, skin lesions Neurological: as above Psychiatric: No depression, insomnia, anxiety Endocrine: No palpitations, fatigue, diaphoresis, mood swings, change in appetite, change in weight, increased thirst Hematologic/Lymphatic:  No purpura, petechiae. Allergic/Immunologic: no itchy/runny eyes, nasal congestion, recent allergic reactions, rashes  PHYSICAL EXAM: Vitals:   01/08/17 1026  BP: 118/70  Pulse: 84   General: No acute distress.  Patient appears well-groomed.   Head:  Normocephalic/atraumatic Eyes:  Fundi examined but not visualized Neck: supple, no paraspinal tenderness, full range of motion Heart:  Regular rate and rhythm Lungs:  Clear to auscultation bilaterally Back: No paraspinal tenderness Neurological Exam: alert and oriented to person, place, and time. Attention span and concentration intact, recent and remote memory intact, fund of knowledge intact.  Speech fluent and not dysarthric, language intact.  CN II-XII intact. Fundoscopic exam unremarkable without vessel changes, exudates, hemorrhages or  papilledema.  Bulk and tone normal, muscle strength 5/5 throughout.  Sensation to pinprick decreased in left foot, vibration intact.  Deep tendon reflexes 2+ throughout, toes downgoing.  Finger to nose and heel to shin testing intact.  Gait normal, Timed 25-foot walk 4.24 seconds.  Romberg with sway.  IMPRESSION: Multiple sclerosis.  Incidentally diagnosed.  No significant clinical history. Left L5 radiculopathy, which I believe is the cause of her balance problems.  PLAN: 1.  Continue D3 4000 IU daily 2.  We will repeat MRI of brain with and without contrast.  If it looks progressed compared to 2016, then I will have Ms. Wiemer follow up to discuss next steps (such as treatment).  Otherwise, follow up in one year.  25 minutes spent face to face with patient, over 50% spent discussing testing and management.  Metta Clines, DO  CC:  Golden Pop, MD

## 2017-01-08 NOTE — Telephone Encounter (Signed)
Burman Foster said that she wants to do this lab with an office visit. Please schedule her with Malachy Mood.

## 2017-01-11 ENCOUNTER — Telehealth: Payer: Self-pay | Admitting: Unknown Physician Specialty

## 2017-01-11 ENCOUNTER — Ambulatory Visit: Payer: BLUE CROSS/BLUE SHIELD | Admitting: Unknown Physician Specialty

## 2017-01-11 NOTE — Telephone Encounter (Signed)
Cassidy Mood,    [name] called to express a few concerns regarding her previous visits at our office.  I would like to share the feedback with you that she expressed regarding her A1-C results and Diabetes Class attendance.

## 2017-01-12 ENCOUNTER — Ambulatory Visit (INDEPENDENT_AMBULATORY_CARE_PROVIDER_SITE_OTHER): Payer: BLUE CROSS/BLUE SHIELD | Admitting: Unknown Physician Specialty

## 2017-01-12 ENCOUNTER — Other Ambulatory Visit: Payer: BLUE CROSS/BLUE SHIELD

## 2017-01-12 ENCOUNTER — Ambulatory Visit: Payer: BLUE CROSS/BLUE SHIELD | Admitting: Family Medicine

## 2017-01-12 ENCOUNTER — Encounter: Payer: Self-pay | Admitting: Unknown Physician Specialty

## 2017-01-12 VITALS — BP 136/84 | HR 91 | Temp 98.2°F | Wt 201.8 lb

## 2017-01-12 DIAGNOSIS — E781 Pure hyperglyceridemia: Secondary | ICD-10-CM

## 2017-01-12 DIAGNOSIS — E785 Hyperlipidemia, unspecified: Secondary | ICD-10-CM | POA: Insufficient documentation

## 2017-01-12 DIAGNOSIS — E119 Type 2 diabetes mellitus without complications: Secondary | ICD-10-CM

## 2017-01-12 DIAGNOSIS — E1169 Type 2 diabetes mellitus with other specified complication: Secondary | ICD-10-CM | POA: Diagnosis not present

## 2017-01-12 DIAGNOSIS — R809 Proteinuria, unspecified: Secondary | ICD-10-CM

## 2017-01-12 LAB — LIPID PANEL PICCOLO, WAIVED
CHOL/HDL RATIO PICCOLO,WAIVE: 4.3 mg/dL
Cholesterol Piccolo, Waived: 159 mg/dL (ref <200)
HDL Chol Piccolo, Waived: 37 mg/dL — ABNORMAL LOW (ref >59)
LDL CHOL CALC PICCOLO WAIVED: 87 mg/dL (ref <100)
TRIGLYCERIDES PICCOLO,WAIVED: 172 mg/dL — AB (ref <150)
VLDL CHOL CALC PICCOLO,WAIVE: 34 mg/dL — AB (ref <30)

## 2017-01-12 LAB — BAYER DCA HB A1C WAIVED: HB A1C: 6.4 % (ref <7.0)

## 2017-01-12 LAB — MICROALBUMIN, URINE WAIVED
Creatinine, Urine Waived: 200 mg/dL (ref 10–300)
Microalb, Ur Waived: 30 mg/L — ABNORMAL HIGH (ref 0–19)
Microalb/Creat Ratio: <30 mg/g (ref <30)

## 2017-01-12 NOTE — Assessment & Plan Note (Signed)
Improved with diet.  Refusing statins

## 2017-01-12 NOTE — Progress Notes (Signed)
BP 136/84 (BP Location: Left Arm, Cuff Size: Large)   Pulse 91   Temp 98.2 F (36.8 C)   Wt 201 lb 12.8 oz (91.5 kg)   LMP 12/22/1999 (Approximate)   SpO2 97%   BMI 33.58 kg/m    Subjective:    Patient ID: [name], female    DOB: 01-08-1951, 66 y.o.   MRN: [id number]  HPI: BEDELIA SALVUCCI is a 66 y.o. female  Diabetes: Using medications without difficulties No hypoglycemic episodes No hyperglycemic episodes Feet problems:history of neuropathy from chemo, LS disk, and MS.  Left worse than right Blood Sugars averaging:130 in the AM eye exam within last year Last Hgb A1C:6.6  Hypertension  Using medications without difficulty Average home BPs   Using medication without problems or lightheadedness No chest pain with exertion or shortness of breath No Edema  Elevated Cholesterol Using medications without problems No Muscle aches  Diet: regular Exercise:regular   Relevant past medical, surgical, family and social history reviewed and updated as indicated. Interim medical history since our last visit reviewed. Allergies and medications reviewed and updated.  Review of Systems  Per HPI unless specifically indicated above     Objective:    BP 136/84 (BP Location: Left Arm, Cuff Size: Large)   Pulse 91   Temp 98.2 F (36.8 C)   Wt 201 lb 12.8 oz (91.5 kg)   LMP 12/22/1999 (Approximate)   SpO2 97%   BMI 33.58 kg/m   Wt Readings from Last 3 Encounters:  01/12/17 201 lb 12.8 oz (91.5 kg)  01/08/17 201 lb 9.6 oz (91.4 kg)  10/29/16 207 lb 11.2 oz (94.2 kg)    Physical Exam  Constitutional: She is oriented to person, place, and time. She appears well-developed and well-nourished. No distress.  HENT:  Head: Normocephalic and atraumatic.  Eyes: Conjunctivae and lids are normal. Right eye exhibits no discharge. Left eye exhibits no discharge. No scleral icterus.  Neck: Normal range of motion. Neck supple. No JVD present. Carotid bruit is not present.    Cardiovascular: Normal rate, regular rhythm and normal heart sounds.   Pulmonary/Chest: Effort normal and breath sounds normal.  Abdominal: Normal appearance. There is no splenomegaly or hepatomegaly.  Musculoskeletal: Normal range of motion.  Neurological: She is alert and oriented to person, place, and time.  Skin: Skin is warm, dry and intact. No rash noted. No pallor.  Psychiatric: She has a normal mood and affect. Her behavior is normal. Judgment and thought content normal.   See foot exam quality metric Results for orders placed or performed in visit on 08/28/16  HM PAP SMEAR  Result Value Ref Range   HM Pap smear Abstracted from [facility]   CBC with Differential/Platelet  Result Value Ref Range   WBC 9.5 3.4 - 10.8 x10E3/uL   RBC 4.74 3.77 - 5.28 x10E6/uL   Hemoglobin 15.3 11.1 - 15.9 g/dL   Hematocrit 44.2 34.0 - 46.6 %   MCV 93 79 - 97 fL   MCH 32.3 26.6 - 33.0 pg   MCHC 34.6 31.5 - 35.7 g/dL   RDW 13.8 12.3 - 15.4 %   Platelets 242 150 - 379 x10E3/uL   Neutrophils 63 Not Estab. %   Lymphs 27 Not Estab. %   Monocytes 7 Not Estab. %   Eos 3 Not Estab. %   Basos 0 Not Estab. %   Neutrophils Absolute 6.0 1.4 - 7.0 x10E3/uL   Lymphocytes Absolute 2.6 0.7 - 3.1 x10E3/uL  Monocytes Absolute 0.7 0.1 - 0.9 x10E3/uL   EOS (ABSOLUTE) 0.3 0.0 - 0.4 x10E3/uL   Basophils Absolute 0.0 0.0 - 0.2 x10E3/uL   Immature Granulocytes 0 Not Estab. %   Immature Grans (Abs) 0.0 0.0 - 0.1 x10E3/uL  Comprehensive metabolic panel  Result Value Ref Range   Glucose 196 (H) 65 - 99 mg/dL   BUN 15 8 - 27 mg/dL   Creatinine, Ser 0.74 0.57 - 1.00 mg/dL   GFR calc non Af Amer 86 >59 mL/min/1.73   GFR calc Af Amer 100 >59 mL/min/1.73   BUN/Creatinine Ratio 20 12 - 28   Sodium 141 134 - 144 mmol/L   Potassium 3.9 3.5 - 5.2 mmol/L   Chloride 103 96 - 106 mmol/L   CO2 20 18 - 29 mmol/L   Calcium 9.4 8.7 - 10.3 mg/dL   Total Protein 7.0 6.0 - 8.5 g/dL   Albumin 4.3 3.6 - 4.8 g/dL   Globulin,  Total 2.7 1.5 - 4.5 g/dL   Albumin/Globulin Ratio 1.6 1.2 - 2.2   Bilirubin Total 0.6 0.0 - 1.2 mg/dL   Alkaline Phosphatase 80 39 - 117 IU/L   AST 24 0 - 40 IU/L   ALT 25 0 - 32 IU/L  Lipid Panel w/o Chol/HDL Ratio  Result Value Ref Range   Cholesterol, Total 171 100 - 199 mg/dL   Triglycerides 396 (H) 0 - 149 mg/dL   HDL 28 (L) >39 mg/dL   VLDL Cholesterol Cal 79 (H) 5 - 40 mg/dL   LDL Calculated 64 0 - 99 mg/dL  TSH  Result Value Ref Range   TSH 0.772 0.450 - 4.500 uIU/mL  VITAMIN D 25 Hydroxy (Vit-D Deficiency, Fractures)  Result Value Ref Range   Vit D, 25-Hydroxy 76.0 30.0 - 100.0 ng/mL  Vitamin B12  Result Value Ref Range   Vitamin B-12 1,369 (H) 211 - 946 pg/mL  Hgb A1c w/o eAG  Result Value Ref Range   Hgb A1c MFr Bld 6.6 (H) 4.8 - 5.6 %  Specimen status report  Result Value Ref Range   specimen status report Comment       Assessment & Plan:   Problem List Items Addressed This Visit      Unprioritized   Diabetes type 2, controlled ([place]) - Primary    Hgb A1C 6.4.  Much improved.  Recheck in 6 months      Relevant Orders   Bayer DCA Hb A1c Waived   Comprehensive metabolic panel   Microalbumin, Urine Waived   Hyperlipidemia    Improved with diet.  Refusing statins      Microalbuminuria    30.  Refusing ACE for now       Other Visit Diagnoses    Hyperlipidemia associated with type 2 diabetes mellitus ([place])       Relevant Orders   Lipid Panel [place], Waived       Follow up plan: Return in about 6 months (around 07/12/2017).

## 2017-01-12 NOTE — Assessment & Plan Note (Signed)
30.  Refusing ACE for now

## 2017-01-12 NOTE — Assessment & Plan Note (Signed)
Hgb A1C 6.4.  Much improved.  Recheck in 6 months

## 2017-01-13 LAB — COMPREHENSIVE METABOLIC PANEL
A/G RATIO: 1.7 (ref 1.2–2.2)
ALT: 28 IU/L (ref 0–32)
AST: 21 IU/L (ref 0–40)
Albumin: 4.6 g/dL (ref 3.6–4.8)
Alkaline Phosphatase: 73 IU/L (ref 39–117)
BUN/Creatinine Ratio: 25 (ref 12–28)
BUN: 18 mg/dL (ref 8–27)
Bilirubin Total: 0.4 mg/dL (ref 0.0–1.2)
CALCIUM: 9.8 mg/dL (ref 8.7–10.3)
CO2: 25 mmol/L (ref 18–29)
Chloride: 102 mmol/L (ref 96–106)
Creatinine, Ser: 0.72 mg/dL (ref 0.57–1.00)
GFR, EST AFRICAN AMERICAN: 102 (ref >59)
GFR, EST NON AFRICAN AMERICAN: 89 (ref >59)
GLOBULIN, TOTAL: 2.7 (ref 1.5–4.5)
Glucose: 108 mg/dL — ABNORMAL HIGH (ref 65–99)
POTASSIUM: 4.3 mmol/L (ref 3.5–5.2)
SODIUM: 140 mmol/L (ref 134–144)
TOTAL PROTEIN: 7.3 g/dL (ref 6.0–8.5)

## 2017-01-19 NOTE — Progress Notes (Signed)
Cassidy Norris  [phone]  [phone]     Cassidy Norris DOB: 12/21/1950  MR#: [id number]  WYO#:[id number]  Patient Care Team: Kathrine Haddock, NP as PCP - General (Nurse Practitioner) Christene Lye, MD (General Surgery) Guadalupe Maple, MD (Family Medicine) Forest Gleason, MD (Oncology)  CHIEF COMPLAINT: Bilateral adenocarcinoma of the breast.  INTERVAL HISTORY:   Patient returns to clinic today for routine six-month follow-up. She is tolerating Aromasin well without significant side effects. She currently feels well and is asymptomatic. She has no neurologic complaints. She denies any recent fevers or illnesses. She has a good appetite and denies weight loss. She has no chest pain or shortness of breath. She denies any nausea, vomiting, constipation, or diarrhea. She has no urinary complaints. Patient feels at her baseline and offers no specific complaints today.   REVIEW OF SYSTEMS:   Review of Systems  Constitutional: Negative for fever, malaise/fatigue and weight loss.  Respiratory: Negative.  Negative for cough and shortness of breath.   Cardiovascular: Negative.  Negative for chest pain and leg swelling.  Gastrointestinal: Negative.  Negative for abdominal pain.  Genitourinary: Negative.   Musculoskeletal: Negative.   Neurological: Negative.  Negative for weakness.  Psychiatric/Behavioral: Negative.  The patient is not nervous/anxious.     As per HPI. Otherwise, a complete review of systems is negative.  ONCOLOGY HISTORY: Oncology History   1. Bilateral carcinoma of breast, February, 2015. Right breast status post radical mastectomy. T1 C. N0M0 (isolated tumor cells in one lymph node) multifocal invasive cancer. Stage IC, Left breast, Status post radical mastectomy, T2, N1, M0 tumor. Stage II, RIght breast. Both tumors are estrogen receptor positive.  Progesterone receptor positive.  HER-2 receptor negative by FISH. Negative for BRCA  mutation.  2. Started on Adriamycin and Cytoxan on March 01, 2014. Last cycle of Cytoxan and Adriamycin on May 01, 2014. Patient has finished last chemotherapy on September 9. (12 th dose was omitted because of neuropathy) 3.  Taking tamoxifen.  Patient had a poor tolerance to letrozole.(October, 2015) 4.  Patient is taking letrozole since November of 2015 5.  May, 2016 Letrozole has been put on hold because of bony pains 6.  Started on Aromasin from July of 2016      Malignant neoplasm of breast ([place])   11/29/2013 Initial Diagnosis    Breast cancer bilateral, multifocal ER/PR pos, Her 2 neg,.       PAST MEDICAL HISTORY: Past Medical History:  Diagnosis Date  . Anemia   . Cancer [facility]) 2014   right breast - Invasive Mammary Carcinoma  . Cancer [facility]) 2014   left breast - Invasive lobular carcinoma and DCIS   . Diabetes mellitus without complication ([place])   . Diffuse cystic mastopathy   . Family history of malignant neoplasm of gastrointestinal tract 2012  . Fractured elbow 08/28/14  . Hx of metabolic acidosis with increased anion gap   . Increased heart rate   . Multiple sclerosis ([place])   . Neuromuscular disorder ([place])    neuropathy in bil feet - left is worse.  . Obesity, unspecified   . Peripheral neuropathy ([place])   . Personal history of tobacco use, presenting hazards to health   . Restless leg syndrome   . Sleep apnea   . Special screening for malignant neoplasms, colon     PAST SURGICAL HISTORY: Past Surgical History:  Procedure Laterality Date  . BREAST BIOPSY Right 1973,2001  . BREAST BIOPSY Right 11-07-13  INVASIVE MAMMARY CARCINOMA , ER/PR positive Her2 negative  . BREAST BIOPSY Left 11-27-13   invasive lobular and DCIS  . BREAST SURGERY Bilateral 01/09/14   mastectomy  . CHOLECYSTECTOMY    . COLONOSCOPY  2010   Dr. Jamal Collin  . COLONOSCOPY WITH PROPOFOL N/A 06/11/2015   Procedure: COLONOSCOPY WITH PROPOFOL;  Surgeon: Christene Lye, MD;  Location: [facility]  ENDOSCOPY;  Service: Endoscopy;  Laterality: N/A;  . DILATION AND CURETTAGE OF UTERUS  1983  . POLYPECTOMY  1989  . PORTACATH PLACEMENT  2015  . SPINE SURGERY  09/14/14   Ruptured disk L4- L5  . TOTAL KNEE ARTHROPLASTY Left 11/22/2015   Procedure: TOTAL KNEE ARTHROPLASTY;  Surgeon: Earlie Server, MD;  Location: [place];  Service: Orthopedics;  Laterality: Left;  . TUBAL LIGATION    . WISDOM TOOTH EXTRACTION  2006    FAMILY HISTORY Family History  Problem Relation Age of Onset  . Cancer Mother     lung  . Cancer Father     colon, stomach  . Diabetes Maternal Grandmother   . Hypertension Sister   . Rectal cancer Paternal Uncle   . Rectal cancer Paternal Uncle     GYNECOLOGIC HISTORY:  Patient's last menstrual period was 12/22/1999 (approximate).     ADVANCED DIRECTIVES:    HEALTH MAINTENANCE: Social History  Substance Use Topics  . Smoking status: Former Smoker    Packs/day: 1.00    Years: 30.00    Types: Cigarettes    Quit date: 12/11/2013  . Smokeless tobacco: Never Used  . Alcohol use No     Comment: social     Colonoscopy:  PAP:  Bone density:  Mammogram:  No Known Allergies  Current Outpatient Prescriptions  Medication Sig Dispense Refill  . acetaminophen (TYLENOL) 325 MG tablet Take 650 mg by mouth every 6 (six) hours as needed.    Marland Kitchen azelastine (ASTELIN) 0.1 % nasal spray   0  . BAYER CONTOUR NEXT TEST test strip   3  . Calcium Carbonate (CALCIUM 600 PO) Take 1 tablet by mouth 2 (two) times daily.    . carvedilol (COREG) 3.125 MG tablet Take 1 tablet (3.125 mg total) by mouth 2 (two) times daily with a meal. 60 tablet 0  . Cholecalciferol (VITAMIN D) 2000 UNITS tablet Take 4,000 Units by mouth daily.    . diphenhydrAMINE (BENADRYL) 25 MG tablet Take 50 mg by mouth at bedtime as needed.    Marland Kitchen exemestane (AROMASIN) 25 MG tablet TAKE 1 BY MOUTH DAILY AFTER BREAKFAST 90 tablet 1  . famotidine (PEPCID) 20 MG tablet Take 20 mg by mouth daily.    . fluticasone  (FLONASE) 50 MCG/ACT nasal spray Place 2 sprays into both nostrils at bedtime.   1  . meloxicam (MOBIC) 15 MG tablet Take 15 mg by mouth daily.  2  . MICROLET LANCETS MISC   3  . pramipexole (MIRAPEX) 0.5 MG tablet Take 1 tablet (0.5 mg total) by mouth at bedtime. 30 tablet 0  . traZODone (DESYREL) 50 MG tablet Take 2 tablets (100 mg total) by mouth at bedtime. 180 tablet 3   No current facility-administered medications for this visit.     OBJECTIVE: BP (!) 152/87 (BP Location: Left Arm, Patient Position: Sitting)   Pulse 91   Temp 99.5 F (37.5 C) (Tympanic)   Wt 201 lb 4.5 oz (91.3 kg)   LMP 12/22/1999 (Approximate)   BMI 33.49 kg/m    Body mass index is 33.49 kg/m.  ECOG FS:0 - Asymptomatic  General: Well-developed, well-nourished, no acute distress. Eyes: Pink conjunctiva, anicteric sclera. Breasts: Patient has bilateral mastectomies. Lungs: Clear to auscultation bilaterally. Heart: Regular rate and rhythm. No rubs, murmurs, or gallops. Abdomen: Soft, nontender, nondistended. No organomegaly noted, normoactive bowel sounds. Musculoskeletal: No edema, cyanosis, or clubbing. Neuro: Alert, answering all questions appropriately. Cranial nerves grossly intact. Skin: No rashes or petechiae noted. Psych: Normal affect.   LAB RESULTS:  No visits with results within 3 Day(s) from this visit.  Latest known visit with results is:  Office Visit on 01/12/2017  Component Date Value Ref Range Status  . Cholesterol Piccolo, Waived 01/12/2017 159  <200 mg/dL Final   Comment:                         Desirable                <200                         Borderline High      200- 239                         High                     >239   . HDL Chol Piccolo, Waived 01/12/2017 37* >59 mg/dL Final   Comment:                         Low HDL- Risk Factor     < 40                         High HDL- Negative       > 59                          Risk Factor (Desirable)   . Triglycerides  Piccolo,Waived 01/12/2017 172* <150 mg/dL Final   Comment:                         Normal                   <150                         Borderline High     150 - 199                         High                200 - 499                         Very High                >499   . Chol/HDL Ratio Piccolo,Waive 01/12/2017 4.3  mg/dL Final   Comment:                                        Female      Female  Low Risk      < 5.0      < 4.5                         High Risk     > 4.9      > 4.4   . LDL Chol Calc Piccolo Waived 01/12/2017 87  <100 mg/dL Final   Comment:                         Optimal                  <100                         Near Optimal        100 - 129                         Borderline High     130 - 159                         High                160 - 189                         Very High                >189   . VLDL Chol Calc Piccolo,Waive 01/12/2017 34* <30 mg/dL Final   Comment:                         Normal                   < 30                         High                     > 29   . Bayer DCA Hb A1c Waived 01/12/2017 6.4  <7.0 % Final   Comment:                                       Diabetic Adult            <7.0                                       Healthy Adult        4.3 - 5.7                                                           (DCCT/NGSP) American Diabetes Association's Summary of Glycemic Recommendations for Adults with Diabetes: Hemoglobin A1c <7.0%. More stringent glycemic goals (A1c <6.0%) may further reduce complications at the cost of increased risk of hypoglycemia.   . Glucose 01/12/2017 108* 65 - 99 mg/dL Final  . BUN  01/12/2017 18  8 - 27 mg/dL Final  . Creatinine, Ser 01/12/2017 0.72  0.57 - 1.00 mg/dL Final  . GFR calc non Af Amer 01/12/2017 89  >59 Final  . GFR calc Af Amer 01/12/2017 102  >59 Final  . BUN/Creatinine Ratio 01/12/2017 25  12 - 28 Final  . Sodium 01/12/2017 140  134 - 144 mmol/L Final  .  Potassium 01/12/2017 4.3  3.5 - 5.2 mmol/L Final  . Chloride 01/12/2017 102  96 - 106 mmol/L Final  . CO2 01/12/2017 25  18 - 29 mmol/L Final  . Calcium 01/12/2017 9.8  8.7 - 10.3 mg/dL Final  . Total Protein 01/12/2017 7.3  6.0 - 8.5 g/dL Final  . Albumin 01/12/2017 4.6  3.6 - 4.8 g/dL Final  . Globulin, Total 01/12/2017 2.7  1.5 - 4.5 Final  . Albumin/Globulin Ratio 01/12/2017 1.7  1.2 - 2.2 Final  . Bilirubin Total 01/12/2017 0.4  0.0 - 1.2 mg/dL Final  . Alkaline Phosphatase 01/12/2017 73  39 - 117 IU/L Final  . AST 01/12/2017 21  0 - 40 IU/L Final  . ALT 01/12/2017 28  0 - 32 IU/L Final  . Microalb, Ur Waived 01/12/2017 30* 0 - 19 mg/L Final  . Creatinine, Urine Waived 01/12/2017 200  10 - 300 mg/dL Final  . Microalb/Creat Ratio 01/12/2017 <30  <30 mg/g Final   Comment:                              Abnormal:       30 - 300                         High Abnormal:           >300     STUDIES: No results found.  ASSESSMENT: Bilateral adenocarcinoma of the breast (right breast, stage I, T1c N0 M0 (isolated tumor cells in 1 lymph node; left breast, stage II, T2 N1 M0.)  PLAN:    1. Bilateral adenocarcinoma of the breast: Patient is status post bilateral radical mastectomies in February 2015. She also received adjuvant chemotherapy with Adriamycin, Cytoxan, and Taxol completed in approximately October 2015. She then initiated letrozole, but could not tolerate secondary to leg pain and was switched to Aromasin. Continue Aromasin completing in October 2021. Patient does not require mammograms given her bilateral mastectomy. Return to clinic in 6 months for routine evaluation. 2. Postmenopausal: Bone mineral density on August 01, 2016 reveald a T-score of -1.2. Patient has been instructed to continue her calcium and vitamin D supplementation.   Patient expressed understanding and was in agreement with this plan. She also understands that She can call clinic at any time with any questions,  concerns, or complaints.   Breast cancer bilateral, multifocal ER/PR pos, Her 2 neg,.   Staging form: Breast, AJCC 7th Edition     Clinical: Stage IIB (T2, N1, M0) - Signed by Forest Gleason, MD on 04/22/2015   Lloyd Huger, MD   01/23/2017 6:31 PM

## 2017-01-20 ENCOUNTER — Ambulatory Visit: Payer: BLUE CROSS/BLUE SHIELD | Admitting: Oncology

## 2017-01-20 ENCOUNTER — Encounter: Payer: Self-pay | Admitting: Oncology

## 2017-01-20 ENCOUNTER — Inpatient Hospital Stay: Payer: BLUE CROSS/BLUE SHIELD | Attending: Oncology | Admitting: Oncology

## 2017-01-20 VITALS — BP 152/87 | HR 91 | Temp 99.5°F | Wt 201.3 lb

## 2017-01-20 DIAGNOSIS — C50912 Malignant neoplasm of unspecified site of left female breast: Secondary | ICD-10-CM

## 2017-01-20 DIAGNOSIS — E669 Obesity, unspecified: Secondary | ICD-10-CM | POA: Insufficient documentation

## 2017-01-20 DIAGNOSIS — Z9013 Acquired absence of bilateral breasts and nipples: Secondary | ICD-10-CM | POA: Diagnosis not present

## 2017-01-20 DIAGNOSIS — Z87891 Personal history of nicotine dependence: Secondary | ICD-10-CM | POA: Insufficient documentation

## 2017-01-20 DIAGNOSIS — G2581 Restless legs syndrome: Secondary | ICD-10-CM | POA: Insufficient documentation

## 2017-01-20 DIAGNOSIS — E119 Type 2 diabetes mellitus without complications: Secondary | ICD-10-CM | POA: Diagnosis not present

## 2017-01-20 DIAGNOSIS — Z170 Estrogen receptor positive status [ER+]: Secondary | ICD-10-CM | POA: Diagnosis not present

## 2017-01-20 DIAGNOSIS — C50911 Malignant neoplasm of unspecified site of right female breast: Secondary | ICD-10-CM | POA: Diagnosis present

## 2017-01-20 DIAGNOSIS — Z9221 Personal history of antineoplastic chemotherapy: Secondary | ICD-10-CM | POA: Diagnosis not present

## 2017-01-20 DIAGNOSIS — G4730 Sleep apnea, unspecified: Secondary | ICD-10-CM | POA: Diagnosis not present

## 2017-01-20 DIAGNOSIS — Z79811 Long term (current) use of aromatase inhibitors: Secondary | ICD-10-CM | POA: Diagnosis not present

## 2017-01-20 DIAGNOSIS — G629 Polyneuropathy, unspecified: Secondary | ICD-10-CM | POA: Diagnosis not present

## 2017-01-20 DIAGNOSIS — G35 Multiple sclerosis: Secondary | ICD-10-CM | POA: Diagnosis not present

## 2017-01-28 ENCOUNTER — Ambulatory Visit
Admission: RE | Admit: 2017-01-28 | Discharge: 2017-01-28 | Disposition: A | Discharge status: Home / Self Care | Payer: BLUE CROSS/BLUE SHIELD | Source: Ambulatory Visit | Attending: Neurology | Admitting: Neurology

## 2017-01-28 DIAGNOSIS — G35 Multiple sclerosis: Secondary | ICD-10-CM | POA: Insufficient documentation

## 2017-01-28 DIAGNOSIS — G939 Disorder of brain, unspecified: Secondary | ICD-10-CM | POA: Diagnosis not present

## 2017-01-28 MED ORDER — GADOBENATE DIMEGLUMINE 529 MG/ML IV SOLN
19.0000 mL | Freq: Once | INTRAVENOUS | Status: AC | PRN
  Administered 2017-01-28: 19 mL via INTRAVENOUS

## 2017-02-01 ENCOUNTER — Ambulatory Visit: Payer: BLUE CROSS/BLUE SHIELD | Attending: Neurology

## 2017-02-01 VITALS — BP 143/65 | HR 88

## 2017-02-01 DIAGNOSIS — M6281 Muscle weakness (generalized): Secondary | ICD-10-CM | POA: Insufficient documentation

## 2017-02-01 DIAGNOSIS — M545 Low back pain: Secondary | ICD-10-CM | POA: Insufficient documentation

## 2017-02-01 DIAGNOSIS — R262 Difficulty in walking, not elsewhere classified: Secondary | ICD-10-CM

## 2017-02-01 DIAGNOSIS — M79672 Pain in left foot: Secondary | ICD-10-CM | POA: Insufficient documentation

## 2017-02-01 NOTE — Therapy (Signed)
[facility] [address] [place], [address] Phone: [phone]   Fax:  [phone]  Physical Therapy Evaluation  Patient Details  Name: Cassidy Norris MRN: [id number] Date of Birth: May 19, 1951 Referring Provider: Metta Clines, DO  Encounter Date: 02/01/2017      PT End of Session - 02/01/17 0849    Visit Number 1   Number of Visits 13   Date for PT Re-Evaluation 03/18/17   PT Start Time 0852   PT Stop Time 1024   PT Time Calculation (min) 92 min   Activity Tolerance Patient tolerated treatment well   Behavior During Therapy [facility] for tasks assessed/performed      Past Medical History:  Diagnosis Date  . Anemia   . Cancer [facility]) 2014   right breast - Invasive Mammary Carcinoma  . Cancer [facility]) 2014   left breast - Invasive lobular carcinoma and DCIS   . Diabetes mellitus without complication ([place])   . Diffuse cystic mastopathy   . Family history of malignant neoplasm of gastrointestinal tract 2012  . Fractured elbow 08/28/14  . Hx of metabolic acidosis with increased anion gap   . Increased heart rate   . Multiple sclerosis ([place])   . Neuromuscular disorder ([place])    neuropathy in bil feet - left is worse.  . Obesity, unspecified   . Peripheral neuropathy ([place])   . Personal history of tobacco use, presenting hazards to health   . Restless leg syndrome   . Sleep apnea   . Special screening for malignant neoplasms, colon     Past Surgical History:  Procedure Laterality Date  . BREAST BIOPSY Right 1973,2001  . BREAST BIOPSY Right 11-07-13   INVASIVE MAMMARY CARCINOMA , ER/PR positive Her2 negative  . BREAST BIOPSY Left 11-27-13   invasive lobular and DCIS  . BREAST SURGERY Bilateral 01/09/14   mastectomy  . CHOLECYSTECTOMY    . COLONOSCOPY  2010   Dr. Jamal Collin  . COLONOSCOPY WITH PROPOFOL N/A 06/11/2015   Procedure: COLONOSCOPY WITH PROPOFOL;  Surgeon: Christene Lye, MD;  Location: [facility]  ENDOSCOPY;  Service: Endoscopy;  Laterality: N/A;  . DILATION AND CURETTAGE OF UTERUS  1983  . POLYPECTOMY  1989  . PORTACATH PLACEMENT  2015  . SPINE SURGERY  09/14/14   Ruptured disk L4- L5  . TOTAL KNEE ARTHROPLASTY Left 11/22/2015   Procedure: TOTAL KNEE ARTHROPLASTY;  Surgeon: Earlie Server, MD;  Location: [place];  Service: Orthopedics;  Laterality: Left;  . TUBAL LIGATION    . WISDOM TOOTH EXTRACTION  2006    Vitals:   02/01/17 0854  BP: (!) 143/65  Pulse: 88         Subjective Assessment - 02/01/17 0858    Subjective L foot pain (medial plantar surface from arch to all toes): no pain but pins and needles 1/10 (pt currently sitting) currently;  4/10 at most (sharp) at most for the past 1 month (when laying in bed supine which lasts for a split second).   Back pain: 0/10 currently, 5/10 at most for the past month (standing 5-10 min)    Pertinent History Multiple sclerosis, low back pain. Pt states having neuropathy in her L foot and her L toes (except great toe) as well as her toes curling under which affects her balance.  Pt noticed symptoms 6 months ago when she noticed her balance was bothering her. Neuropathy started since her chemotherapy in 2015 which worsened after her  knee surgery in 2016. Feels neuropathy mostly in her L foot, feels symptoms in R foot and both hands. Has a hx of a ruptured lumbar disc S/P surgery 09/14/2014.  Prior to her surgery pt states feeling L LE symptoms. Current symptoms slightly different (L foot symptoms started after her chemotherapy).  Back bothers her when she stands for a long time. Does not know if her pain is due to being off balance, trying to keep herself from falling while keeping good posture.  Denies unexplained changes in weight. Denies bowel or blader problems or saddle anesthesia.  Pt was diagnosed with MS in 2003. Was given steriod medication at that time. Pt states that she found that she had MS when she fell and had imaging. Had imaging  recently but does not know the result.  Pt states having a little balance problem but no falls within the last 6 months. Balance feels off however since the last month and it did not used to be that way.   Pt states that she is being followed by an oncologist and is currently cancer free as far as she knows.    Patient Stated Goals Pt expresses desires to decrease her L foot symptoms.    Currently in Pain? Yes   Pain Score 1    Pain Location Foot   Pain Orientation Left   Pain Descriptors / Indicators Tingling;Pins and needles;Numbness   Pain Type Chronic pain   Pain Onset More than a month ago   Pain Frequency Constant   Aggravating Factors  back pain: prolonged standing (5-10 min). L foot: supine. Seated bilateral knee extension.    Pain Relieving Factors back: sitting down. L foot having shoes on, compression socks            [facility] PT Assessment - 02/01/17 0917      Assessment   Medical Diagnosis Multiple sclerosis, lumbar radiculpopathy   Referring Provider Metta Clines, DO   Onset Date/Surgical Date 01/08/17  Date PT referral signed. Chronic condition   Prior Therapy No known physical therapy for current condition     Precautions   Precaution Comments multiple sclerosis, Hx of CA     Restrictions   Other Position/Activity Restrictions no known restrictions     Balance Screen   Has the patient fallen in the past 6 months No   Has the patient had a decrease in activity level because of a fear of falling?  No  pt states fear of falling   Is the patient reluctant to leave their home because of a fear of falling?  No  pt states fear of falling     Home Environment   Additional Comments Patient lives in a 1 story home alone, 3 steps to enter with L rail       Prior Function   Vocation Retired   [employer] PLOF: better able to ambulate, tolerate standing and supine with less L foot and back symptoms     Observation/Other Assessments   Observations Decreased  plantar foot tightness with sitting with upright posture.  (+) long sit test suggesting anterior nutation of L innominate. L toe (all) extension reproduces symptoms. No L foot symptom reproduction with SLR flexion bilaterally (ipsilateral anterior thigh tightness which eases with ipsilateral ankle PF).  TUG 10 seconds, 8 seconds, 9 seconds (9 seconds average; < 12 seconds suggests decreased fall risk). Unsteady with standing with feet together with eyes closed.    Modified Oswertry 28%   Lower Extremity  Functional Scale  67/80     Posture/Postural Control   Posture Comments L lateral shift, bilaterally protracted shoulders and neck, decreased lordosis, L greater trochanter slightly highter, L weight shifting, R knee valgus     AROM   Overall AROM Comments PROM: hip extension 12 degrees L,  8 degrees R; Most symptom reproduction with lumbar flexion with over pressure as well as L lumbar side bending (no overpressure).    Lumbar Flexion WFL with L anterior thigh tightness. L toe tightness with overpressure (most reproduction of symptoms)   Lumbar Extension WFL with L (anterior) thigh and toe tightness (symptom reproduction)   Lumbar - Right Side Bend WFL, no symptoms with over pressure   Lumbar - Left Side [facility] with L toe symptom reproduction (most reproduction of symptoms)   Lumbar - Right Rotation sitting, WFL  no symptoms with overpressure   Lumbar - Left Rotation sitting, WFL  no symptoms with overpressure     Strength   Right Hip Flexion 4/5   Right Hip Extension 4-/5  with hamstring cramp   Right Hip ABduction 4+/5   Left Hip Flexion 4/5   Left Hip Extension 4/5  with hamstring cramp   Left Hip ABduction 4+/5   Right Knee Flexion 5/5  R forefoot cramp, eases with rest   Right Knee Extension 5/5   Left Knee Flexion 4+/5  L toe cramp, eases with rest   Left Knee Extension 5/5     Palpation   Palpation comment decreased sensation to light touch L plantar forefoot area.       Ambulation/Gait   Gait Comments Antalgic, decreased stance R LE, R knee valgus with slight R lateral lean during R LE stance phase, decreased stance R LE, decreased trunk rotation.  Decreased push-off bilateral feet     Objectives   There-ex  Directed patient with sitting with lumbar towel roll x 3 min  No change in L foot symptom.   Pt still recommended to use lumbar support when sitting secondary to decreased L plantar foot tightness with sitting with upright posture earlier   Standing L lateral shift correction 10x5 seconds. No change in L foot symptoms  Standing R lateral shift correction movement 6x5 seconds   Increased L foot tightness, eases with rest     seated L hip extension isometrics with L foot on pillow 10x 5 seconds for 2 sets  No change in symptoms  Seated R hip extension isometrics with R foot on pillow 10x5 seconds for 2 sets   No chance in symptoms    Improved exercise technique, movement at target joints, use of target muscles after mod verbal, visual, tactile cues.     Try R side bend movements next visit                      PT Education - 02/01/17 1135    Education provided Yes   Education Details ther-ex, HEP, plan of care, lumbar nerves in association to L foot   Person(s) Educated Patient   Methods Explanation;Demonstration;Tactile cues;Verbal cues   Comprehension Verbalized understanding;Returned demonstration             PT Long Term Goals - 02/01/17 1138      PT LONG TERM GOAL #1   Title Patient will have a decrease in L foot pain to 1/10 or less at most to promote ability to perform functional tasks such as walk, squat, and tolerate positions such as standing  or supine.    Baseline 4/10 L foot pain at most (02/01/2017)   Time 6   Period Weeks   Status New     PT LONG TERM GOAL #2   Title Patient will improve her LEFS score by 9 points as a demonstration of improved function.    Baseline 67/80 (02/01/2017)   Time 6    Period Weeks   Status New     PT LONG TERM GOAL #3   Title Patient will improve biliateral glute max strength by at least 1/2 MMT grade to promote ability to peform functional tasks with less back and L foot symptoms.    Time 6   Period Weeks   Status New     PT LONG TERM GOAL #4   Title Patient will improve her Modified Oswestry Low Back Pain Disability Questionnaire by at least 12% as a demonstration of improved function.    Baseline 28% (02/01/2017)   Time 6   Period Weeks   Status New               Plan - 02/01/17 1116    Clinical Impression Statement Patient is a 66 year old female who came to physical therapy secondary to L foot pain/paresthesia/discomfort. She also presents with reproduction of symptoms with standing lumbar flexion, extension, L side bending, as well as with manual toe extension (digits 2-5); altered gait pattern and posture, bilateral glute max weakness, slight decrease in balance with standing with eyes closed and difficulty performing functional tasks such as walking, and squatting (based on LEFS) and tolerating positions such as standing, as well as movements such as straightening out her legs in sitting. Patient will benefit from skilled physical therapy services to address the aforementioned deficits.     Rehab Potential Fair   Clinical Impairments Affecting Rehab Potential (-) chronicity of condition, MS, multiple health problems; (+) motivation   PT Frequency 2x / week   PT Duration 6 weeks   PT Treatment/Interventions Therapeutic activities;Therapeutic exercise;Patient/family education;Balance training;Neuromuscular re-education;Manual techniques;Dry needling;Aquatic Therapy;Electrical Stimulation;Iontophoresis 41m/ml Dexamethasone;Ultrasound;Traction  modalities if appropriate   PT Next Visit Plan posture, glute strengthening, scapular strengthening/thoracic extension   Consulted and Agree with Plan of Care Patient      Patient will benefit  from skilled therapeutic intervention in order to improve the following deficits and impairments:  Pain, Postural dysfunction, Improper body mechanics, Impaired sensation, Difficulty walking, Decreased strength  Visit Diagnosis: Pain in left foot - Plan: PT plan of care cert/re-cert  Muscle weakness (generalized) - Plan: PT plan of care cert/re-cert  Low back pain, unspecified back pain laterality, unspecified chronicity, with sciatica presence unspecified - Plan: PT plan of care cert/re-cert  Difficulty in walking, not elsewhere classified - Plan: PT plan of care cert/re-cert     Problem List Patient Active Problem List   Diagnosis Date Noted  . Microalbuminuria 01/12/2017  . Hyperlipidemia 01/12/2017  . Diabetes type 2, controlled ([place] 09/02/2016  . B12 deficiency 07/23/2016  . Primary localized osteoarthritis of left knee 11/22/2015  . History of colon polyps 10/10/2015  . Family history of colon cancer 10/10/2015  . Elevated fasting blood sugar 09/23/2015  . Restless leg 07/22/2015  . Insomnia 07/22/2015  . Fast heart beat 04/19/2015  . Peripheral neuropathy due to chemotherapy ([place] 03/19/2015  . Lumbar radiculopathy 03/19/2015  . Malignant ne[place] of breast ([place] 11/29/2013  . DS (disseminated sclerosis) ([place]   . Family history of cancer of digestive system    [facility]  PT, DPT   02/01/2017, 12:41 PM  [facility] [address] [place], [address] Phone: [phone]   Fax:  [phone]  Name: Cassidy Norris MRN: [id number] Date of Birth: 07/04/1951

## 2017-02-01 NOTE — Patient Instructions (Signed)
  Pt was recommended to use a lumbar towel roll whenever sitting to see if it helps her with her L foot symptoms. Pt demonstrated and verbalized understanding.

## 2017-02-04 ENCOUNTER — Telehealth: Payer: Self-pay | Admitting: Neurology

## 2017-02-04 ENCOUNTER — Ambulatory Visit: Payer: BLUE CROSS/BLUE SHIELD

## 2017-02-04 DIAGNOSIS — M79672 Pain in left foot: Secondary | ICD-10-CM | POA: Diagnosis not present

## 2017-02-04 DIAGNOSIS — M6281 Muscle weakness (generalized): Secondary | ICD-10-CM

## 2017-02-04 DIAGNOSIS — R262 Difficulty in walking, not elsewhere classified: Secondary | ICD-10-CM

## 2017-02-04 DIAGNOSIS — M545 Low back pain: Secondary | ICD-10-CM

## 2017-02-04 NOTE — Telephone Encounter (Signed)
Spoke to patient. Gave instructions per Dr. Georgie Chard previous note. Pt was grateful and verbalized understanding. Pt says she will just hold-off for now until she speaks with insurance company about drug plans in general.

## 2017-02-04 NOTE — Therapy (Signed)
[facility] [address] [place], [address] Phone: [phone]   Fax:  [phone]  Physical Therapy Treatment  Patient Details  Name: Cassidy Norris MRN: [id number] Date of Birth: 02/24/1951 Referring Provider: Metta Clines, DO  Encounter Date: 02/04/2017      PT End of Session - 02/04/17 0809    Visit Number 2   Number of Visits 13   Date for PT Re-Evaluation 03/18/17   PT Start Time 0809   PT Stop Time 6712   PT Time Calculation (min) 45 min   Activity Tolerance Patient tolerated treatment well   Behavior During Therapy [facility] for tasks assessed/performed      Past Medical History:  Diagnosis Date  . Anemia   . Cancer [facility]) 2014   right breast - Invasive Mammary Carcinoma  . Cancer [facility]) 2014   left breast - Invasive lobular carcinoma and DCIS   . Diabetes mellitus without complication ([place])   . Diffuse cystic mastopathy   . Family history of malignant neoplasm of gastrointestinal tract 2012  . Fractured elbow 08/28/14  . Hx of metabolic acidosis with increased anion gap   . Increased heart rate   . Multiple sclerosis ([place])   . Neuromuscular disorder ([place])    neuropathy in bil feet - left is worse.  . Obesity, unspecified   . Peripheral neuropathy ([place])   . Personal history of tobacco use, presenting hazards to health   . Restless leg syndrome   . Sleep apnea   . Special screening for malignant neoplasms, colon     Past Surgical History:  Procedure Laterality Date  . BREAST BIOPSY Right 1973,2001  . BREAST BIOPSY Right 11-07-13   INVASIVE MAMMARY CARCINOMA , ER/PR positive Her2 negative  . BREAST BIOPSY Left 11-27-13   invasive lobular and DCIS  . BREAST SURGERY Bilateral 01/09/14   mastectomy  . CHOLECYSTECTOMY    . COLONOSCOPY  2010   Dr. Jamal Collin  . COLONOSCOPY WITH PROPOFOL N/A 06/11/2015   Procedure: COLONOSCOPY WITH PROPOFOL;  Surgeon: Christene Lye, MD;  Location: [facility]  ENDOSCOPY;  Service: Endoscopy;  Laterality: N/A;  . DILATION AND CURETTAGE OF UTERUS  1983  . POLYPECTOMY  1989  . PORTACATH PLACEMENT  2015  . SPINE SURGERY  09/14/14   Ruptured disk L4- L5  . TOTAL KNEE ARTHROPLASTY Left 11/22/2015   Procedure: TOTAL KNEE ARTHROPLASTY;  Surgeon: Earlie Server, MD;  Location: [place];  Service: Orthopedics;  Laterality: Left;  . TUBAL LIGATION    . WISDOM TOOTH EXTRACTION  2006    There were no vitals filed for this visit.      Subjective Assessment - 02/04/17 0810    Subjective When she woke up the next morning (Tuesday), her rear end muscles, lower back, both knees, calves, and L foot bothered her.  Could not feel any difference with using the lumbar towel roll.  1.5/10 L foot tinlging and numbness (plantar surface).  Low back feels a little sore today (1/10 currently).  The discomfort from Tuesday eased off.    Pertinent History Multiple sclerosis, low back pain. Pt states having neuropathy in her L foot and her L toes (except great toe) as well as her toes curling under which affects her balance.  Pt noticed symptoms 6 months ago when she noticed her balance was bothering her. Neuropathy started since her chemotherapy in 2015 which worsened after her knee surgery in 2016. Feels neuropathy mostly in  her L foot, feels symptoms in R foot and both hands. Has a hx of a ruptured lumbar disc S/P surgery 09/14/2014.  Prior to her surgery pt states feeling L LE symptoms. Current symptoms slightly different (L foot symptoms started after her chemotherapy).  Back bothers her when she stands for a long time. Does not know if her pain is due to being off balance, trying to keep herself from falling while keeping good posture.  Denies unexplained changes in weight. Denies bowel or blader problems or saddle anesthesia.  Pt was diagnosed with MS in 2003. Was given steriod medication at that time. Pt states that she found that she had MS when she fell and had imaging. Had  imaging recently but does not know the result.  Pt states having a little balance problem but no falls within the last 6 months. Balance feels off however since the last month and it did not used to be that way.   Pt states that she is being followed by an oncologist and is currently cancer free as far as she knows.    Patient Stated Goals Pt expresses desires to decrease her L foot symptoms.    Currently in Pain? Yes   Pain Score 2   1.5/10 L foot symptoms.    Pain Onset More than a month ago                                 PT Education - 02/04/17 0834    Education provided Yes   Education Details ther-ex, HEP   Person(s) Educated Patient   Methods Explanation;Demonstration;Tactile cues;Verbal cues;Handout   Comprehension Verbalized understanding;Returned demonstration        Objectives   There-ex  Directed patient with standing R side bend 10x2 with 5 seconds. No changes in foot symptoms.   Seated L hip extension isometrics with towel under thigh 10x5 seconds for 3 sets. Decreased L foot symptoms  Seated hip adductor ball squeeze with glute max muscle activation 10x5 seconds for 3 sets  Forward step up onto 1st regular step with L LE, bilateral UE assist 10x   Standing ankle DF/PF on rocker board 2 min. No change in L foot symptoms  Supine L single knee to chest 10x5  Seconds for 2 sets   Try manual therapy STM to L piriformis, hamstrings and plantar surface of L foot next visit   Improved exercise technique, movement at target joints, use of target muscles after mod verbal, visual, tactile cues.     Decreased symptoms with seated L hip extension isometrics to promote posterior nutation of L innominate and increase L glute max muscle use. No change in symptoms with rest of exercises today.           PT Long Term Goals - 02/01/17 1138      PT LONG TERM GOAL #1   Title Patient will have a decrease in L foot pain to 1/10 or less at most  to promote ability to perform functional tasks such as walk, squat, and tolerate positions such as standing or supine.    Baseline 4/10 L foot pain at most (02/01/2017)   Time 6   Period Weeks   Status New     PT LONG TERM GOAL #2   Title Patient will improve her LEFS score by 9 points as a demonstration of improved function.    Baseline 67/80 (02/01/2017)   Time 6  Period Weeks   Status New     PT LONG TERM GOAL #3   Title Patient will improve biliateral glute max strength by at least 1/2 MMT grade to promote ability to peform functional tasks with less back and L foot symptoms.    Time 6   Period Weeks   Status New     PT LONG TERM GOAL #4   Title Patient will improve her Modified Oswestry Low Back Pain Disability Questionnaire by at least 12% as a demonstration of improved function.    Baseline 28% (02/01/2017)   Time 6   Period Weeks   Status New               Plan - 02/04/17 5883    Clinical Impression Statement Decreased symptoms with seated L hip extension isometrics to promote posterior nutation of L innominate and increase L glute max muscle use. No change in symptoms with rest of exercises today.   Rehab Potential Fair   Clinical Impairments Affecting Rehab Potential (-) chronicity of condition, MS, multiple health problems; (+) motivation   PT Frequency 2x / week   PT Duration 6 weeks   PT Treatment/Interventions Therapeutic activities;Therapeutic exercise;Patient/family education;Balance training;Neuromuscular re-education;Manual techniques;Dry needling;Aquatic Therapy;Electrical Stimulation;Iontophoresis 10m/ml Dexamethasone;Ultrasound;Traction  modalities if appropriate   PT Next Visit Plan posture, glute strengthening, scapular strengthening/thoracic extension   Consulted and Agree with Plan of Care Patient      Patient will benefit from skilled therapeutic intervention in order to improve the following deficits and impairments:  Pain, Postural  dysfunction, Improper body mechanics, Impaired sensation, Difficulty walking, Decreased strength  Visit Diagnosis: Pain in left foot  Muscle weakness (generalized)  Low back pain, unspecified back pain laterality, unspecified chronicity, with sciatica presence unspecified  Difficulty in walking, not elsewhere classified     Problem List Patient Active Problem List   Diagnosis Date Noted  . Microalbuminuria 01/12/2017  . Hyperlipidemia 01/12/2017  . Diabetes type 2, controlled ([place] 09/02/2016  . B12 deficiency 07/23/2016  . Primary localized osteoarthritis of left knee 11/22/2015  . History of colon polyps 10/10/2015  . Family history of colon cancer 10/10/2015  . Elevated fasting blood sugar 09/23/2015  . Restless leg 07/22/2015  . Insomnia 07/22/2015  . Fast heart beat 04/19/2015  . Peripheral neuropathy due to chemotherapy ([place] 03/19/2015  . Lumbar radiculopathy 03/19/2015  . Malignant ne[place] of breast ([place] 11/29/2013  . DS (disseminated sclerosis) ([place]   . Family history of cancer of digestive system     MJoneen BoersPT, DPT   02/04/2017, 10:08 AM  [facility] [address] [place] [address]Phone: [phone]  Fax:  [phone] Name: GMORRIS MARKHAMMRN: [id number] of Birth: 122-Apr-1952

## 2017-02-04 NOTE — Telephone Encounter (Signed)
There are several options which I would prefer to discuss in-person.  We can send her information on various medications, however.

## 2017-02-04 NOTE — Patient Instructions (Signed)
Gave seated L hip hip extension isometrics (with pillow under L thigh for knee comfort) 10x3 with 5 seconds daily as part of her HEP. Pt demonstrated and verbalized understanding.

## 2017-02-04 NOTE — Telephone Encounter (Signed)
Cassidy Norris [birth year]. She was wanting to know what the medications were that Dr. Tomi Likens was going to put her on. They had spoke after her MRI and she will be going on Medicare at some point this year and wanted a list of those medications mailed to her so she can find out if they will be covered? Her # is [phone]. Thank you

## 2017-02-08 ENCOUNTER — Ambulatory Visit: Payer: BLUE CROSS/BLUE SHIELD

## 2017-02-08 DIAGNOSIS — M79672 Pain in left foot: Secondary | ICD-10-CM

## 2017-02-08 DIAGNOSIS — R262 Difficulty in walking, not elsewhere classified: Secondary | ICD-10-CM

## 2017-02-08 DIAGNOSIS — M6281 Muscle weakness (generalized): Secondary | ICD-10-CM

## 2017-02-08 DIAGNOSIS — M545 Low back pain: Secondary | ICD-10-CM

## 2017-02-08 NOTE — Patient Instructions (Signed)
  Gave seated L ankle IV, EV, and ankle DF 10x3 each way daily as part of her HEP. Pt demonstrated and verbalized understanding.

## 2017-02-08 NOTE — Therapy (Signed)
[place] [facility] [address], [place], [address] Phone: [phone]   Fax:  [phone]  Physical Therapy Treatment  Patient Details  Name: Cassidy Norris MRN: [id number] Date of Birth: 1951-01-02 Referring Provider: Metta Clines, DO  Encounter Date: 02/08/2017      PT End of Session - 02/08/17 1121    Visit Number 3   Number of Visits 13   Date for PT Re-Evaluation 03/18/17   PT Start Time 1120   PT Stop Time 1207   PT Time Calculation (min) 47 min   Activity Tolerance Patient tolerated treatment well   Behavior During Therapy [facility] for tasks assessed/performed      Past Medical History:  Diagnosis Date  . Anemia   . Cancer [facility]) 2014   right breast - Invasive Mammary Carcinoma  . Cancer [facility]) 2014   left breast - Invasive lobular carcinoma and DCIS   . Diabetes mellitus without complication ([place])   . Diffuse cystic mastopathy   . Family history of malignant neoplasm of gastrointestinal tract 2012  . Fractured elbow 08/28/14  . Hx of metabolic acidosis with increased anion gap   . Increased heart rate   . Multiple sclerosis ([place])   . Neuromuscular disorder ([place])    neuropathy in bil feet - left is worse.  . Obesity, unspecified   . Peripheral neuropathy ([place])   . Personal history of tobacco use, presenting hazards to health   . Restless leg syndrome   . Sleep apnea   . Special screening for malignant neoplasms, colon     Past Surgical History:  Procedure Laterality Date  . BREAST BIOPSY Right 1973,2001  . BREAST BIOPSY Right 11-07-13   INVASIVE MAMMARY CARCINOMA , ER/PR positive Her2 negative  . BREAST BIOPSY Left 11-27-13   invasive lobular and DCIS  . BREAST SURGERY Bilateral 01/09/14   mastectomy  . CHOLECYSTECTOMY    . COLONOSCOPY  2010   Dr. Jamal Collin  . COLONOSCOPY WITH PROPOFOL N/A 06/11/2015   Procedure: COLONOSCOPY WITH PROPOFOL;  Surgeon: Christene Lye, MD;  Location: [facility]  ENDOSCOPY;  Service: Endoscopy;  Laterality: N/A;  . DILATION AND CURETTAGE OF UTERUS  1983  . POLYPECTOMY  1989  . PORTACATH PLACEMENT  2015  . SPINE SURGERY  09/14/14   Ruptured disk L4- L5  . TOTAL KNEE ARTHROPLASTY Left 11/22/2015   Procedure: TOTAL KNEE ARTHROPLASTY;  Surgeon: Earlie Server, MD;  Location: [place];  Service: Orthopedics;  Laterality: Left;  . TUBAL LIGATION    . WISDOM TOOTH EXTRACTION  2006    There were no vitals filed for this visit.      Subjective Assessment - 02/08/17 1121    Subjective Lower back has been hurting her, has been having sharp pain in her L hip joint. L foot is more tingly. Numbness is the same. Has not had sharp pain in hip since 2016.  Went to computer class Friday, sat on a stool for 1.5 hours which bothered her back.   Pt states thinking she might need an ankle brace for her L foot from PT to increase toe extension to hopefully increase balance.  Balance also affects her in the morning. Gets dizzy when getting up from the supine position. Room does not feel like its spinning. Gets better after sitting for a few minutes. Still has dizziness when standing afterwards. Pt also states sometimes feeling like she is waving back and forth when walking a pretty  good distance.    Pertinent History Multiple sclerosis, low back pain. Pt states having neuropathy in her L foot and her L toes (except great toe) as well as her toes curling under which affects her balance.  Pt noticed symptoms 6 months ago when she noticed her balance was bothering her. Neuropathy started since her chemotherapy in 2015 which worsened after her knee surgery in 2016. Feels neuropathy mostly in her L foot, feels symptoms in R foot and both hands. Has a hx of a ruptured lumbar disc S/P surgery 09/14/2014.  Prior to her surgery pt states feeling L LE symptoms. Current symptoms slightly different (L foot symptoms started after her chemotherapy).  Back bothers her when she stands for a long  time. Does not know if her pain is due to being off balance, trying to keep herself from falling while keeping good posture.  Denies unexplained changes in weight. Denies bowel or blader problems or saddle anesthesia.  Pt was diagnosed with MS in 2003. Was given steriod medication at that time. Pt states that she found that she had MS when she fell and had imaging. Had imaging recently but does not know the result.  Pt states having a little balance problem but no falls within the last 6 months. Balance feels off however since the last month and it did not used to be that way.   Pt states that she is being followed by an oncologist and is currently cancer free as far as she knows.    Patient Stated Goals Pt expresses desires to decrease her L foot symptoms.    Currently in Pain? Yes   Pain Score --  2.5/10 L foot, 0/10 L hip 1.5/10 low back pain currently.    Pain Onset More than a month ago                                 PT Education - 02/08/17 1216    Education provided Yes   Education Details ther-ex, HEP   Person(s) Educated Patient   Methods Explanation;Demonstration;Tactile cues;Verbal cues   Comprehension Verbalized understanding;Returned demonstration        Objectives  Pt states thinking that she might need an ankle brace for her L foot for PT to increase toe extension to hopefully increase balance Per pt balance affects her in the morning. Gets dizzy when getting up from supine. Room does not feel like its spinning.   Gets better after a few minutes. Still has dizziness when standing afterwards.   Pt also states sometimes feeling like she is "waving" back and forth when walking a pretty good distance    Manual therapy   STM L piriformis muscle in R S/L  Supine STM to plantar surface of L foot   Manual ankle DF, toe extension digits 2-5 (with ankle PF), ankle IV, EV   Per pt, area of symptoms changes when moving her foot. No change in symptoms  afterwards.    There-ex  Seated L ankle inversion 10x3. Increased L plantar toe symptoms (digits 4 and 5), eases with rest Seated L ankle Eversion 10x3  Seated ankle PF 10x (increases symptoms, eases with rest)  Seated ankle DF 10x3. Slight increase in symptoms at ball of foot, eases with rest.    Improved exercise technique, movement at target joints, use of target muscles after min to mod verbal, visual, tactile cues.    Per pt, area of  plantar foot symptoms (tingling and tightness) change with foot movements during manual therapy. Increased L plantar foot tightness with ankle inversion and plantarflexion movements. Eases with rest. Pt tolerated session without increased L hip, knee, or back pain.        PT Long Term Goals - 02/01/17 1138      PT LONG TERM GOAL #1   Title Patient will have a decrease in L foot pain to 1/10 or less at most to promote ability to perform functional tasks such as walk, squat, and tolerate positions such as standing or supine.    Baseline 4/10 L foot pain at most (02/01/2017)   Time 6   Period Weeks   Status New     PT LONG TERM GOAL #2   Title Patient will improve her LEFS score by 9 points as a demonstration of improved function.    Baseline 67/80 (02/01/2017)   Time 6   Period Weeks   Status New     PT LONG TERM GOAL #3   Title Patient will improve biliateral glute max strength by at least 1/2 MMT grade to promote ability to peform functional tasks with less back and L foot symptoms.    Time 6   Period Weeks   Status New     PT LONG TERM GOAL #4   Title Patient will improve her Modified Oswestry Low Back Pain Disability Questionnaire by at least 12% as a demonstration of improved function.    Baseline 28% (02/01/2017)   Time 6   Period Weeks   Status New               Plan - 02/08/17 1217    Clinical Impression Statement Per pt, area of plantar foot symptoms (tingling and tightness) change with foot movements during manual  therapy. Increased L plantar foot tightness with ankle inversion and plantarflexion movements. Eases with rest. Pt tolerated session without increased L hip, knee, or back pain.   Rehab Potential Fair   Clinical Impairments Affecting Rehab Potential (-) chronicity of condition, MS, multiple health problems; (+) motivation   PT Frequency 2x / week   PT Duration 6 weeks   PT Treatment/Interventions Therapeutic activities;Therapeutic exercise;Patient/family education;Balance training;Neuromuscular re-education;Manual techniques;Dry needling;Aquatic Therapy;Electrical Stimulation;Iontophoresis 29m/ml Dexamethasone;Ultrasound;Traction  modalities if appropriate   PT Next Visit Plan posture, glute strengthening, scapular strengthening/thoracic extension   Consulted and Agree with Plan of Care Patient      Patient will benefit from skilled therapeutic intervention in order to improve the following deficits and impairments:  Pain, Postural dysfunction, Improper body mechanics, Impaired sensation, Difficulty walking, Decreased strength  Visit Diagnosis: Pain in left foot  Muscle weakness (generalized)  Low back pain, unspecified back pain laterality, unspecified chronicity, with sciatica presence unspecified  Difficulty in walking, not elsewhere classified     Problem List Patient Active Problem List   Diagnosis Date Noted  . Microalbuminuria 01/12/2017  . Hyperlipidemia 01/12/2017  . Diabetes type 2, controlled ([place] 09/02/2016  . B12 deficiency 07/23/2016  . Primary localized osteoarthritis of left knee 11/22/2015  . History of colon polyps 10/10/2015  . Family history of colon cancer 10/10/2015  . Elevated fasting blood sugar 09/23/2015  . Restless leg 07/22/2015  . Insomnia 07/22/2015  . Fast heart beat 04/19/2015  . Peripheral neuropathy due to chemotherapy ([place] 03/19/2015  . Lumbar radiculopathy 03/19/2015  . Malignant neoplasm of breast ([place] 11/29/2013  . DS (disseminated  sclerosis) ([place]   . Family history of cancer of digestive  system     Joneen Boers PT, DPT   02/08/2017, 12:48 PM  [place] [facility] [address] [place], [address] Phone: [phone]   Fax:  [phone]  Name: Cassidy Norris MRN: [id number] Date of Birth: 1951/01/17

## 2017-02-11 ENCOUNTER — Ambulatory Visit: Payer: BLUE CROSS/BLUE SHIELD

## 2017-02-11 DIAGNOSIS — R262 Difficulty in walking, not elsewhere classified: Secondary | ICD-10-CM

## 2017-02-11 DIAGNOSIS — M545 Low back pain: Secondary | ICD-10-CM

## 2017-02-11 DIAGNOSIS — M79672 Pain in left foot: Secondary | ICD-10-CM | POA: Diagnosis not present

## 2017-02-11 DIAGNOSIS — M6281 Muscle weakness (generalized): Secondary | ICD-10-CM

## 2017-02-11 NOTE — Therapy (Signed)
[place] [facility] [address], [place], [address] Phone: [phone]   Fax:  [phone]  Physical Therapy Treatment  Patient Details  Name: Cassidy Norris MRN: [id number] Date of Birth: 1951/11/04 Referring Provider: Metta Clines, DO  Encounter Date: 02/11/2017      PT End of Session - 02/11/17 1301    Visit Number 4   Number of Visits 13   Date for PT Re-Evaluation 03/18/17   PT Start Time 1301   PT Stop Time 5916   PT Time Calculation (min) 46 min   Activity Tolerance Patient tolerated treatment well   Behavior During Therapy [facility] for tasks assessed/performed      Past Medical History:  Diagnosis Date  . Anemia   . Cancer [facility]) 2014   right breast - Invasive Mammary Carcinoma  . Cancer [facility]) 2014   left breast - Invasive lobular carcinoma and DCIS   . Diabetes mellitus without complication ([place])   . Diffuse cystic mastopathy   . Family history of malignant neoplasm of gastrointestinal tract 2012  . Fractured elbow 08/28/14  . Hx of metabolic acidosis with increased anion gap   . Increased heart rate   . Multiple sclerosis ([place])   . Neuromuscular disorder ([place])    neuropathy in bil feet - left is worse.  . Obesity, unspecified   . Peripheral neuropathy ([place])   . Personal history of tobacco use, presenting hazards to health   . Restless leg syndrome   . Sleep apnea   . Special screening for malignant neoplasms, colon     Past Surgical History:  Procedure Laterality Date  . BREAST BIOPSY Right 1973,2001  . BREAST BIOPSY Right 11-07-13   INVASIVE MAMMARY CARCINOMA , ER/PR positive Her2 negative  . BREAST BIOPSY Left 11-27-13   invasive lobular and DCIS  . BREAST SURGERY Bilateral 01/09/14   mastectomy  . CHOLECYSTECTOMY    . COLONOSCOPY  2010   Dr. Jamal Collin  . COLONOSCOPY WITH PROPOFOL N/A 06/11/2015   Procedure: COLONOSCOPY WITH PROPOFOL;  Surgeon: Christene Lye, MD;  Location: [facility]  ENDOSCOPY;  Service: Endoscopy;  Laterality: N/A;  . DILATION AND CURETTAGE OF UTERUS  1983  . POLYPECTOMY  1989  . PORTACATH PLACEMENT  2015  . SPINE SURGERY  09/14/14   Ruptured disk L4- L5  . TOTAL KNEE ARTHROPLASTY Left 11/22/2015   Procedure: TOTAL KNEE ARTHROPLASTY;  Surgeon: Earlie Server, MD;  Location: [place];  Service: Orthopedics;  Laterality: Left;  . TUBAL LIGATION    . WISDOM TOOTH EXTRACTION  2006    There were no vitals filed for this visit.      Subjective Assessment - 02/11/17 1302    Subjective Low back is not bothering her right now. The foot is the same. Her foot wanted to cramp (arch of foot area) when trying to do the exercises. No change in L foot symptoms. L foot cramps when she presses her foot down, and IV. Used to cramp when she puts her L sock on (L hip ER).  1.5/10 L foot "uncomfortable aggravation; tight semi tingly funny feeling. Feels better when her shoes are on   Pertinent History Multiple sclerosis, low back pain. Pt states having neuropathy in her L foot and her L toes (except great toe) as well as her toes curling under which affects her balance.  Pt noticed symptoms 6 months ago when she noticed her balance was bothering her. Neuropathy started since her  chemotherapy in 2015 which worsened after her knee surgery in 2016. Feels neuropathy mostly in her L foot, feels symptoms in R foot and both hands. Has a hx of a ruptured lumbar disc S/P surgery 09/14/2014.  Prior to her surgery pt states feeling L LE symptoms. Current symptoms slightly different (L foot symptoms started after her chemotherapy).  Back bothers her when she stands for a long time. Does not know if her pain is due to being off balance, trying to keep herself from falling while keeping good posture.  Denies unexplained changes in weight. Denies bowel or blader problems or saddle anesthesia.  Pt was diagnosed with MS in 2003. Was given steriod medication at that time. Pt states that she found that  she had MS when she fell and had imaging. Had imaging recently but does not know the result.  Pt states having a little balance problem but no falls within the last 6 months. Balance feels off however since the last month and it did not used to be that way.   Pt states that she is being followed by an oncologist and is currently cancer free as far as she knows.    Patient Stated Goals Pt expresses desires to decrease her L foot symptoms.    Currently in Pain? Yes   Pain Score 2   1.5/10   Pain Onset More than a month ago                                 PT Education - 02/11/17 1314    Education provided Yes   Education Details ther-ex   [employer]) Educated Patient   Methods Explanation;Demonstration;Tactile cues;Verbal cues;Handout   Comprehension Verbalized understanding;Returned demonstration       Objectives  L low back side bend posture observed and palpated.      There-ex    Directed patient with standing bilateral shoulder extension resisting yellow band 10x3 with 5 second holds  Standing R shoulder adduction resisting yellow band 10x5 seconds for 3 sets to promote proper posture, decrease L low back side bending  Reviewed and given as part of her HEP. Pt demonstrated and verbalized understanding  Seated thoracic extension over chair 10x5 seconds for 2 sets  Standing hip machine (height 8th hole from the top): extension plate 55 for 8x3 L hip to promote L glute max strengthening.   Supine L hip extension isometrics L leg straight, 2 pillows under thigh, R knee in hooklying position. 10x3 with 5 seconds.  Reviewed and given as part of her HEP. Pt demonstrated and verbalized understanding.    Improved exercise technique, movement at target joints, use of target muscles after min to mod verbal, visual, tactile cues.     Worked on decreasing L low back side bending, gentle thoracic extension mobility, trunk muscle activation and hip strengthening  today. No change in L foot symptoms. Challenges to progress include chronicity of her L foot paresthesia.         PT Long Term Goals - 02/01/17 1138      PT LONG TERM GOAL #1   Title Patient will have a decrease in L foot pain to 1/10 or less at most to promote ability to perform functional tasks such as walk, squat, and tolerate positions such as standing or supine.    Baseline 4/10 L foot pain at most (02/01/2017)   Time 6   Period Weeks   Status  New     PT LONG TERM GOAL #2   Title Patient will improve her LEFS score by 9 points as a demonstration of improved function.    Baseline 67/80 (02/01/2017)   Time 6   Period Weeks   Status New     PT LONG TERM GOAL #3   Title Patient will improve biliateral glute max strength by at least 1/2 MMT grade to promote ability to peform functional tasks with less back and L foot symptoms.    Time 6   Period Weeks   Status New     PT LONG TERM GOAL #4   Title Patient will improve her Modified Oswestry Low Back Pain Disability Questionnaire by at least 12% as a demonstration of improved function.    Baseline 28% (02/01/2017)   Time 6   Period Weeks   Status New               Plan - 02/11/17 1314    Clinical Impression Statement Worked on decreasing L low back side bending, gentle thoracic extension mobility, trunk muscle activation and hip strengthening today. No change in L foot symptoms. Challenges to progress include chronicity of her L foot paresthesia.    Rehab Potential Fair   Clinical Impairments Affecting Rehab Potential (-) chronicity of condition, MS, multiple health problems; (+) motivation   PT Frequency 2x / week   PT Duration 6 weeks   PT Treatment/Interventions Therapeutic activities;Therapeutic exercise;Patient/family education;Balance training;Neuromuscular re-education;Manual techniques;Dry needling;Aquatic Therapy;Electrical Stimulation;Iontophoresis 63m/ml Dexamethasone;Ultrasound;Traction  modalities if  appropriate   PT Next Visit Plan posture, glute strengthening, scapular strengthening/thoracic extension   Consulted and Agree with Plan of Care Patient      Patient will benefit from skilled therapeutic intervention in order to improve the following deficits and impairments:  Pain, Postural dysfunction, Improper body mechanics, Impaired sensation, Difficulty walking, Decreased strength  Visit Diagnosis: Pain in left foot  Muscle weakness (generalized)  Low back pain, unspecified back pain laterality, unspecified chronicity, with sciatica presence unspecified  Difficulty in walking, not elsewhere classified     Problem List Patient Active Problem List   Diagnosis Date Noted  . Microalbuminuria 01/12/2017  . Hyperlipidemia 01/12/2017  . Diabetes type 2, controlled ([place] 09/02/2016  . B12 deficiency 07/23/2016  . Primary localized osteoarthritis of left knee 11/22/2015  . History of colon polyps 10/10/2015  . Family history of colon cancer 10/10/2015  . Elevated fasting blood sugar 09/23/2015  . Restless leg 07/22/2015  . Insomnia 07/22/2015  . Fast heart beat 04/19/2015  . Peripheral neuropathy due to chemotherapy ([place] 03/19/2015  . Lumbar radiculopathy 03/19/2015  . Malignant neoplasm of breast ([place] 11/29/2013  . DS (disseminated sclerosis) ([place]   . Family history of cancer of digestive system     MJoneen BoersPT, DPT   02/11/2017, 2:00 PM  [facility] [address] [place] [address]Phone: [phone]  Fax:  [phone] Name: GTAQUILA LEYSMRN: [id number] of Birth: [birth year]

## 2017-02-11 NOTE — Patient Instructions (Addendum)
    Hold tubing in right hand, arm out. Pull arm to your side. Do not twist or rotate trunk.  Hold for 5 seconds. Repeat ___10_ times per set. Do 3___ sets per session. Do _1___ sessions per day.  http://orth.exer.us/834   Copyright  VHI. All rights reserved.    [place] L hip extension isometrics L leg straight, 2 pillows under thigh, R knee in hooklying position. 10x3 with 5 seconds.  Reviewed and given as part of her HEP. Pt demonstrated and verbalized understanding. Handout provided.

## 2017-02-15 ENCOUNTER — Ambulatory Visit: Payer: BLUE CROSS/BLUE SHIELD

## 2017-02-15 DIAGNOSIS — M6281 Muscle weakness (generalized): Secondary | ICD-10-CM

## 2017-02-15 DIAGNOSIS — M79672 Pain in left foot: Secondary | ICD-10-CM

## 2017-02-15 DIAGNOSIS — R262 Difficulty in walking, not elsewhere classified: Secondary | ICD-10-CM

## 2017-02-15 DIAGNOSIS — M545 Low back pain: Secondary | ICD-10-CM

## 2017-02-15 NOTE — Therapy (Signed)
[facility] [address], [place], [address] Phone: [phone]   Fax:  [phone]  Physical Therapy Treatment  Patient Details  Name: Cassidy Norris MRN: [id number] Date of Birth: September 02, 1951 Referring Provider: Metta Clines, DO  Encounter Date: 02/15/2017      PT End of Session - 02/15/17 1054    Visit Number 5   Number of Visits 13   Date for PT Re-Evaluation 03/18/17   PT Start Time 1054   PT Stop Time 1156   PT Time Calculation (min) 62 min   Activity Tolerance Patient tolerated treatment well   Behavior During Therapy [facility] for tasks assessed/performed      Past Medical History:  Diagnosis Date  . Anemia   . Cancer [facility]) 2014   right breast - Invasive Mammary Carcinoma  . Cancer [facility]) 2014   left breast - Invasive lobular carcinoma and DCIS   . Diabetes mellitus without complication ([place])   . Diffuse cystic mastopathy   . Family history of malignant neoplasm of gastrointestinal tract 2012  . Fractured elbow 08/28/14  . Hx of metabolic acidosis with increased anion gap   . Increased heart rate   . Multiple sclerosis ([place])   . Neuromuscular disorder ([place])    neuropathy in bil feet - left is worse.  . Obesity, unspecified   . Peripheral neuropathy ([place])   . Personal history of tobacco use, presenting hazards to health   . Restless leg syndrome   . Sleep apnea   . Special screening for malignant neoplasms, colon     Past Surgical History:  Procedure Laterality Date  . BREAST BIOPSY Right 1973,2001  . BREAST BIOPSY Right 11-07-13   INVASIVE MAMMARY CARCINOMA , ER/PR positive Her2 negative  . BREAST BIOPSY Left 11-27-13   invasive lobular and DCIS  . BREAST SURGERY Bilateral 01/09/14   mastectomy  . CHOLECYSTECTOMY    . COLONOSCOPY  2010   Dr. Jamal Collin  . COLONOSCOPY WITH PROPOFOL N/A 06/11/2015   Procedure: COLONOSCOPY WITH PROPOFOL;  Surgeon: Christene Lye, MD;  Location: [facility]  ENDOSCOPY;  Service: Endoscopy;  Laterality: N/A;  . DILATION AND CURETTAGE OF UTERUS  1983  . POLYPECTOMY  1989  . PORTACATH PLACEMENT  2015  . SPINE SURGERY  09/14/14   Ruptured disk L4- L5  . TOTAL KNEE ARTHROPLASTY Left 11/22/2015   Procedure: TOTAL KNEE ARTHROPLASTY;  Surgeon: Earlie Server, MD;  Location: [place];  Service: Orthopedics;  Laterality: Left;  . TUBAL LIGATION    . WISDOM TOOTH EXTRACTION  2006    There were no vitals filed for this visit.      Subjective Assessment - 02/15/17 1056    Subjective Low back has off and on pains since last treatment (1-1.5/10). No back pain currently. Has not had back pain in a long time. Had a few sharp pains in the front he her hip joint over the weekend. Has had them off and on since her knee surgery.  L foot feels the same.  Unable to tell whether the HEP makes a difference. Feels a tendency to cramp at the bottom of her L foot when she turns her L foot in.  Pt states getting balance problems when walking 1/4th of a mile (pt demonstrates tendency to weave to the L; feels like she might have a leg length discrepancy after her L knee surgery) or if she was going to hug somebody (secondary to not being  used to not having her breasts after the mastectomy surgery) .  Feels like the CA treatment and the MS might have affected her depth perception as well. Has to hold onto a rail when going down steps because she feels like the step is closer up to her than it actually is.    Stepping down curbs, walking on uneven surfaces affects her balance. Better able to extend her does since starting PT.    Pertinent History Multiple sclerosis, low back pain. Pt states having neuropathy in her L foot and her L toes (except great toe) as well as her toes curling under which affects her balance.  Pt noticed symptoms 6 months ago when she noticed her balance was bothering her. Neuropathy started since her chemotherapy in 2015 which worsened after her knee surgery in  2016. Feels neuropathy mostly in her L foot, feels symptoms in R foot and both hands. Has a hx of a ruptured lumbar disc S/P surgery 09/14/2014.  Prior to her surgery pt states feeling L LE symptoms. Current symptoms slightly different (L foot symptoms started after her chemotherapy).  Back bothers her when she stands for a long time. Does not know if her pain is due to being off balance, trying to keep herself from falling while keeping good posture.  Denies unexplained changes in weight. Denies bowel or blader problems or saddle anesthesia.  Pt was diagnosed with MS in 2003. Was given steriod medication at that time. Pt states that she found that she had MS when she fell and had imaging. Had imaging recently but does not know the result.  Pt states having a little balance problem but no falls within the last 6 months. Balance feels off however since the last month and it did not used to be that way.   Pt states that she is being followed by an oncologist and is currently cancer free as far as she knows.    Patient Stated Goals Pt expresses desires to decrease her L foot symptoms.    Currently in Pain? Other (Comment)  L foot numb/tingling feeling   Pain Onset More than a month ago            [facility] PT Assessment - 02/15/17 1112      Dynamic Gait Index   Level Surface Mild Impairment   Change in Gait Speed Mild Impairment   Gait with Horizontal Head Turns Normal   Gait with Vertical Head Turns Normal   Gait and Pivot Turn Normal   Step Over Obstacle Normal   Step Around Obstacles Normal   Steps Mild Impairment   Total Score 21   DGI comment: < of  = 19 suggests increased fall risk.                              PT Education - 02/15/17 1202    Education provided Yes   Education Details ther-ex, HEP   Person(s) Educated Patient   Methods Explanation;Demonstration;Tactile cues;Verbal cues;Handout   Comprehension Verbalized understanding;Returned demonstration         Objectives    There-ex  gait x 6 min             1470 ft. L low back pain towards last lap. Pt demonstrates L trunk rotation during L LE stance phase  Gait x 32 ft             Then with changes in velocity  Then with head turns vertical and horizontal             then with turning 180 degrees                Then stepping over an obstacle               Then with stepping around 2 obstacles                  Ascending and descending 4 regular steps 2x. No UE assist going up, R rail assist going down secondary to unsteadiness. Demonstrates bilateral femoral adduction and IR  Forward step up onto 3 inch step with one UE assist 10x3 each LE, emphasis on femoral control  Seated L foot arch "doming" 5x5 seconds             then 10x with 5 seconds             With manual resistance to forefoot 10x2 with 5 seconds.     Improved exercise technique, movement at target joints, use of target muscles after mod verbal, visual, tactile cues.   Pt demonstrates a score of 21/24 with her Dynamic Gait index suggesting decreased fall risk. Per pt, her balance difficulty however is on more uneven surfaces such as inclines, and stepping onto and off curbs. Worked on increasing intrinsic foot muscle strength secondary to pt previous report of L foot symptoms feeling better with foot support in a shoe as well as worked on increasing toe extension ROM to promote increased surface area foot comes in contact to on the ground to help with balance and gait. Worked on step ups onto 3 inch step with emphasis on femoral control to help with balance with stepping onto and off curbs.        PT Long Term Goals - 02/01/17 1138      PT LONG TERM GOAL #1   Title Patient will have a decrease in L foot pain to 1/10 or less at most to promote ability to perform functional tasks such as walk, squat, and tolerate positions such as standing or supine.    Baseline 4/10 L foot pain at most  (02/01/2017)   Time 6   Period Weeks   Status New     PT LONG TERM GOAL #2   Title Patient will improve her LEFS score by 9 points as a demonstration of improved function.    Baseline 67/80 (02/01/2017)   Time 6   Period Weeks   Status New     PT LONG TERM GOAL #3   Title Patient will improve biliateral glute max strength by at least 1/2 MMT grade to promote ability to peform functional tasks with less back and L foot symptoms.    Time 6   Period Weeks   Status New     PT LONG TERM GOAL #4   Title Patient will improve her Modified Oswestry Low Back Pain Disability Questionnaire by at least 12% as a demonstration of improved function.    Baseline 28% (02/01/2017)   Time 6   Period Weeks   Status New               Plan - 02/15/17 1311    Clinical Impression Statement Pt demonstrates a score of 21/24 with her Dynamic Gait index suggesting decreased fall risk. Per pt, her balance difficulty however is on more uneven surfaces such as inclines, and stepping onto and off curbs. Worked on increasing  intrinsic foot muscle strength secondary to pt previous report of L foot symptoms feeling better with foot support in a shoe as well as worked on increasing toe extension ROM to promote increased surface area foot comes in contact to on the ground to help with balance and gait. Worked on step ups onto 3 inch step with emphasis on femoral control to help with balance with stepping onto and off curbs.    Rehab Potential Fair   Clinical Impairments Affecting Rehab Potential (-) chronicity of condition, MS, multiple health problems; (+) motivation   PT Frequency 2x / week   PT Duration 6 weeks   PT Treatment/Interventions Therapeutic activities;Therapeutic exercise;Patient/family education;Balance training;Neuromuscular re-education;Manual techniques;Dry needling;Aquatic Therapy;Electrical Stimulation;Iontophoresis 63m/ml Dexamethasone;Ultrasound;Traction  modalities if appropriate   PT Next  Visit Plan posture, glute strengthening, scapular strengthening/thoracic extension   Consulted and Agree with Plan of Care Patient      Patient will benefit from skilled therapeutic intervention in order to improve the following deficits and impairments:  Pain, Postural dysfunction, Improper body mechanics, Impaired sensation, Difficulty walking, Decreased strength  Visit Diagnosis: Pain in left foot  Muscle weakness (generalized)  Low back pain, unspecified back pain laterality, unspecified chronicity, with sciatica presence unspecified  Difficulty in walking, not elsewhere classified     Problem List Patient Active Problem List   Diagnosis Date Noted  . Microalbuminuria 01/12/2017  . Hyperlipidemia 01/12/2017  . Diabetes type 2, controlled ([place] 09/02/2016  . B12 deficiency 07/23/2016  . Primary localized osteoarthritis of left knee 11/22/2015  . History of colon polyps 10/10/2015  . Family history of colon cancer 10/10/2015  . Elevated fasting blood sugar 09/23/2015  . Restless leg 07/22/2015  . Insomnia 07/22/2015  . Fast heart beat 04/19/2015  . Peripheral neuropathy due to chemotherapy ([place] 03/19/2015  . Lumbar radiculopathy 03/19/2015  . Malignant neoplasm of breast ([place] 11/29/2013  . DS (disseminated sclerosis) ([place]   . Family history of cancer of digestive system    MJoneen BoersPT, DPT   02/15/2017, 1:26 PM  [facility] [address] [place] [address]Phone: [phone]  Fax:  [phone] Name: GDENASIA VENNMRN: [id number] of Birth: 107-May-1952

## 2017-02-15 NOTE — Patient Instructions (Addendum)
  Toe extension stretch Sitting on a chair:   Gently press your toes comfortably against a towel roll on the floor to feel a small to medium stretch.   Hold for 10 seconds.   Repeat 10 times.    Perform 3 sets daily.        Doming of arch Sitting on a chair,    L heel and forefoot on the floor,    Pretend as if you have a marble underneath your arch.   Gently lift your arch without curling your toes.    Hold for 5 seconds.    Repeat 10 times.    Perform 3 sets of 10 daily.

## 2017-02-18 ENCOUNTER — Ambulatory Visit: Payer: BLUE CROSS/BLUE SHIELD

## 2017-02-18 DIAGNOSIS — M545 Low back pain: Secondary | ICD-10-CM

## 2017-02-18 DIAGNOSIS — M79672 Pain in left foot: Secondary | ICD-10-CM

## 2017-02-18 DIAGNOSIS — R262 Difficulty in walking, not elsewhere classified: Secondary | ICD-10-CM

## 2017-02-18 DIAGNOSIS — M6281 Muscle weakness (generalized): Secondary | ICD-10-CM

## 2017-02-18 NOTE — Therapy (Signed)
[facility] [address] [place], [address] Phone: [phone]   Fax:  [phone]  Physical Therapy Treatment  Patient Details  Name: Cassidy Norris MRN: [id number] Date of Birth: 05/27/1951 Referring Provider: Metta Clines, DO  Encounter Date: 02/18/2017      PT End of Session - 02/18/17 0923    Visit Number 6   Number of Visits 13   Date for PT Re-Evaluation 03/18/17   PT Start Time 0923   PT Stop Time 1013   PT Time Calculation (min) 50 min   Activity Tolerance Patient tolerated treatment well   Behavior During Therapy [facility] for tasks assessed/performed      Past Medical History:  Diagnosis Date  . Anemia   . Cancer [facility]) 2014   right breast - Invasive Mammary Carcinoma  . Cancer [facility]) 2014   left breast - Invasive lobular carcinoma and DCIS   . Diabetes mellitus without complication ([place])   . Diffuse cystic mastopathy   . Family history of malignant neoplasm of gastrointestinal tract 2012  . Fractured elbow 08/28/14  . Hx of metabolic acidosis with increased anion gap   . Increased heart rate   . Multiple sclerosis ([place])   . Neuromuscular disorder ([place])    neuropathy in bil feet - left is worse.  . Obesity, unspecified   . Peripheral neuropathy ([place])   . Personal history of tobacco use, presenting hazards to health   . Restless leg syndrome   . Sleep apnea   . Special screening for malignant neoplasms, colon     Past Surgical History:  Procedure Laterality Date  . BREAST BIOPSY Right 1973,2001  . BREAST BIOPSY Right 11-07-13   INVASIVE MAMMARY CARCINOMA , ER/PR positive Her2 negative  . BREAST BIOPSY Left 11-27-13   invasive lobular and DCIS  . BREAST SURGERY Bilateral 01/09/14   mastectomy  . CHOLECYSTECTOMY    . COLONOSCOPY  2010   Dr. Jamal Collin  . COLONOSCOPY WITH PROPOFOL N/A 06/11/2015   Procedure: COLONOSCOPY WITH PROPOFOL;  Surgeon: Christene Lye, MD;  Location: [facility]  ENDOSCOPY;  Service: Endoscopy;  Laterality: N/A;  . DILATION AND CURETTAGE OF UTERUS  1983  . POLYPECTOMY  1989  . PORTACATH PLACEMENT  2015  . SPINE SURGERY  09/14/14   Ruptured disk L4- L5  . TOTAL KNEE ARTHROPLASTY Left 11/22/2015   Procedure: TOTAL KNEE ARTHROPLASTY;  Surgeon: Earlie Server, MD;  Location: [place];  Service: Orthopedics;  Laterality: Left;  . TUBAL LIGATION    . WISDOM TOOTH EXTRACTION  2006    There were no vitals filed for this visit.      Subjective Assessment - 02/18/17 0924    Subjective L toes are about the same. Low back is fine today. No pain in back or L toes. Has pain in L knee joint (1.5/10 L knee currently usually worsens with a bunch of standing but eases off with sitting).   Pertinent History Multiple sclerosis, low back pain. Pt states having neuropathy in her L foot and her L toes (except great toe) as well as her toes curling under which affects her balance.  Pt noticed symptoms 6 months ago when she noticed her balance was bothering her. Neuropathy started since her chemotherapy in 2015 which worsened after her knee surgery in 2016. Feels neuropathy mostly in her L foot, feels symptoms in R foot and both hands. Has a hx of a ruptured lumbar disc S/P surgery  09/14/2014.  Prior to her surgery pt states feeling L LE symptoms. Current symptoms slightly different (L foot symptoms started after her chemotherapy).  Back bothers her when she stands for a long time. Does not know if her pain is due to being off balance, trying to keep herself from falling while keeping good posture.  Denies unexplained changes in weight. Denies bowel or blader problems or saddle anesthesia.  Pt was diagnosed with MS in 2003. Was given steriod medication at that time. Pt states that she found that she had MS when she fell and had imaging. Had imaging recently but does not know the result.  Pt states having a little balance problem but no falls within the last 6 months. Balance feels off  however since the last month and it did not used to be that way.   Pt states that she is being followed by an oncologist and is currently cancer free as far as she knows.    Patient Stated Goals Pt expresses desires to decrease her L foot symptoms.    Currently in Pain? Yes   Pain Score 1   1.5/10 L knee joint, no back or L foot pain   Pain Onset More than a month ago                                 PT Education - 02/18/17 0948    Education provided Yes   Education Details ther-ex   [employer]) Educated Patient   Methods Explanation;Demonstration;Tactile cues;Verbal cues   Comprehension Returned demonstration;Verbalized understanding        Objectives    There-ex  Seated press-ups on mat table 5x5 seconds for 2 sets to decrease pressure to low back. No change felt per pt.  Standing R trunk side bend 10x with 5 second holds, then 8x5 seconds. Pain at dorsal L 4th and 5th toes, eases with neutral position as well as sitting Standing L trunk side bend 10x5 seconds. Slight R mid back 1.5/10 pain, eases with standing up straight and rest.  Standing gentle trunk extension with slight L side bend 10x2 with 5 second holds. Slight mid back discomfort which eases with rest Seated L hip extension isometrics 10x2 with 5 second holds with L foot on 3 inch step to increase L glute muscle use. R trunk side bend movement observed.  Seated R hip extension isometrics 10x2 with 5 second holds with R foot on 3 inch step to increase R glute muscle use  Forward step up and over Air ex pad without UE assist 10x each LE to promote balance when on uneven surface. Decreased L ankle stability observed  Forward step up onto and over and backwards onto the Air Ex Pad with contralateral UE assist 10x2 each LE  Air Ex Beam:  Forward and backward walking with one UE assist 10x2  Walking over balance stones forward and back with one UE assist 5x each way    Improved exercise  technique, movement at target joints, use of target muscles after min to mod verbal, visual, tactile cues.     No change in L plantar foot numbness with exercises targeting the back today. Worked on balance on uneven surfaces to improve ability to ambulate and negotiate uneven surfaces based on one of pt goals. Balance exercises performed to promoted L LE stability, balance, and proprioception. Decreased L ankle control observed with forward step ups onto Air Ex Pad  with L LE without UE assist. Continue working on ankle stability to help with balance. Difficulty with backwards related activities.            PT Long Term Goals - 02/01/17 1138      PT LONG TERM GOAL #1   Title Patient will have a decrease in L foot pain to 1/10 or less at most to promote ability to perform functional tasks such as walk, squat, and tolerate positions such as standing or supine.    Baseline 4/10 L foot pain at most (02/01/2017)   Time 6   Period Weeks   Status New     PT LONG TERM GOAL #2   Title Patient will improve her LEFS score by 9 points as a demonstration of improved function.    Baseline 67/80 (02/01/2017)   Time 6   Period Weeks   Status New     PT LONG TERM GOAL #3   Title Patient will improve biliateral glute max strength by at least 1/2 MMT grade to promote ability to peform functional tasks with less back and L foot symptoms.    Time 6   Period Weeks   Status New     PT LONG TERM GOAL #4   Title Patient will improve her Modified Oswestry Low Back Pain Disability Questionnaire by at least 12% as a demonstration of improved function.    Baseline 28% (02/01/2017)   Time 6   Period Weeks   Status New               Plan - 02/18/17 0912    Clinical Impression Statement No change in L plantar foot numbness with exercises targeting the back today. Worked on balance on uneven surfaces to improve ability to ambulate and negotiate uneven surfaces based on one of pt goals. Balance  exercises performed to promoted L LE stability, balance, and proprioception. Decreased L ankle control observed with forward step ups onto Air Ex Pad with L LE without UE assist. Continue working on ankle stability to help with balance. Difficulty with backwards related activities.    Rehab Potential Fair   Clinical Impairments Affecting Rehab Potential (-) chronicity of condition, MS, multiple health problems; (+) motivation   PT Frequency 2x / week   PT Duration 6 weeks   PT Treatment/Interventions Therapeutic activities;Therapeutic exercise;Patient/family education;Balance training;Neuromuscular re-education;Manual techniques;Dry needling;Aquatic Therapy;Electrical Stimulation;Iontophoresis 32m/ml Dexamethasone;Ultrasound;Traction  modalities if appropriate   PT Next Visit Plan posture, glute strengthening, scapular strengthening/thoracic extension   Consulted and Agree with Plan of Care Patient      Patient will benefit from skilled therapeutic intervention in order to improve the following deficits and impairments:  Pain, Postural dysfunction, Improper body mechanics, Impaired sensation, Difficulty walking, Decreased strength  Visit Diagnosis: Pain in left foot  Muscle weakness (generalized)  Low back pain, unspecified back pain laterality, unspecified chronicity, with sciatica presence unspecified  Difficulty in walking, not elsewhere classified     Problem List Patient Active Problem List   Diagnosis Date Noted  . Microalbuminuria 01/12/2017  . Hyperlipidemia 01/12/2017  . Diabetes type 2, controlled ([place] 09/02/2016  . B12 deficiency 07/23/2016  . Primary localized osteoarthritis of left knee 11/22/2015  . History of colon polyps 10/10/2015  . Family history of colon cancer 10/10/2015  . Elevated fasting blood sugar 09/23/2015  . Restless leg 07/22/2015  . Insomnia 07/22/2015  . Fast heart beat 04/19/2015  . Peripheral neuropathy due to chemotherapy ([place] 03/19/2015   . Lumbar radiculopathy 03/19/2015  .  Malignant neoplasm of breast ([place]) 11/29/2013  . DS (disseminated sclerosis) ([place])   . Family history of cancer of digestive system     Joneen Boers PT, DPT   02/18/2017, 7:19 PM  [facility] [address] [place], [address] Phone: [phone]   Fax:  [phone]  Name: Cassidy Norris MRN: [id number] Date of Birth: 30-Aug-1951

## 2017-02-23 ENCOUNTER — Ambulatory Visit: Payer: BLUE CROSS/BLUE SHIELD | Attending: Neurology

## 2017-02-23 DIAGNOSIS — M545 Low back pain: Secondary | ICD-10-CM | POA: Diagnosis present

## 2017-02-23 DIAGNOSIS — M79672 Pain in left foot: Secondary | ICD-10-CM | POA: Insufficient documentation

## 2017-02-23 DIAGNOSIS — R262 Difficulty in walking, not elsewhere classified: Secondary | ICD-10-CM | POA: Insufficient documentation

## 2017-02-23 DIAGNOSIS — M6281 Muscle weakness (generalized): Secondary | ICD-10-CM

## 2017-02-23 NOTE — Therapy (Signed)
[place] [facility] [address], [place], [address] Phone: [phone]   Fax:  [phone]  Physical Therapy Treatment  Patient Details  Name: Cassidy Norris MRN: [id number] Date of Birth: 12/17/1950 Referring Provider: Metta Clines, DO  Encounter Date: 02/23/2017      PT End of Session - 02/23/17 1341    Visit Number 7   Number of Visits 13   Date for PT Re-Evaluation 03/18/17   PT Start Time 1341   PT Stop Time 1431   PT Time Calculation (min) 50 min   Activity Tolerance Patient tolerated treatment well   Behavior During Therapy [facility] for tasks assessed/performed      Past Medical History:  Diagnosis Date  . Anemia   . Cancer [facility]) 2014   right breast - Invasive Mammary Carcinoma  . Cancer [facility]) 2014   left breast - Invasive lobular carcinoma and DCIS   . Diabetes mellitus without complication ([place])   . Diffuse cystic mastopathy   . Family history of malignant neoplasm of gastrointestinal tract 2012  . Fractured elbow 08/28/14  . Hx of metabolic acidosis with increased anion gap   . Increased heart rate   . Multiple sclerosis ([place])   . Neuromuscular disorder ([place])    neuropathy in bil feet - left is worse.  . Obesity, unspecified   . Peripheral neuropathy ([place])   . Personal history of tobacco use, presenting hazards to health   . Restless leg syndrome   . Sleep apnea   . Special screening for malignant neoplasms, colon     Past Surgical History:  Procedure Laterality Date  . BREAST BIOPSY Right 1973,2001  . BREAST BIOPSY Right 11-07-13   INVASIVE MAMMARY CARCINOMA , ER/PR positive Her2 negative  . BREAST BIOPSY Left 11-27-13   invasive lobular and DCIS  . BREAST SURGERY Bilateral 01/09/14   mastectomy  . CHOLECYSTECTOMY    . COLONOSCOPY  2010   Dr. Jamal Collin  . COLONOSCOPY WITH PROPOFOL N/A 06/11/2015   Procedure: COLONOSCOPY WITH PROPOFOL;  Surgeon: Christene Lye, MD;  Location: [facility]  ENDOSCOPY;  Service: Endoscopy;  Laterality: N/A;  . DILATION AND CURETTAGE OF UTERUS  1983  . POLYPECTOMY  1989  . PORTACATH PLACEMENT  2015  . SPINE SURGERY  09/14/14   Ruptured disk L4- L5  . TOTAL KNEE ARTHROPLASTY Left 11/22/2015   Procedure: TOTAL KNEE ARTHROPLASTY;  Surgeon: Earlie Server, MD;  Location: [place];  Service: Orthopedics;  Laterality: Left;  . TUBAL LIGATION    . WISDOM TOOTH EXTRACTION  2006    There were no vitals filed for this visit.      Subjective Assessment - 02/23/17 1344    Subjective Pt states taking 2000 mg (4x500 mg) of amoxycillin secondary to going to the dentist earlier and hx of L knee surgery.   No back pain currently. L foot is the same.    Pertinent History Multiple sclerosis, low back pain. Pt states having neuropathy in her L foot and her L toes (except great toe) as well as her toes curling under which affects her balance.  Pt noticed symptoms 6 months ago when she noticed her balance was bothering her. Neuropathy started since her chemotherapy in 2015 which worsened after her knee surgery in 2016. Feels neuropathy mostly in her L foot, feels symptoms in R foot and both hands. Has a hx of a ruptured lumbar disc S/P surgery 09/14/2014.  Prior to her surgery  pt states feeling L LE symptoms. Current symptoms slightly different (L foot symptoms started after her chemotherapy).  Back bothers her when she stands for a long time. Does not know if her pain is due to being off balance, trying to keep herself from falling while keeping good posture.  Denies unexplained changes in weight. Denies bowel or blader problems or saddle anesthesia.  Pt was diagnosed with MS in 2003. Was given steriod medication at that time. Pt states that she found that she had MS when she fell and had imaging. Had imaging recently but does not know the result.  Pt states having a little balance problem but no falls within the last 6 months. Balance feels off however since the last month  and it did not used to be that way.   Pt states that she is being followed by an oncologist and is currently cancer free as far as she knows.    Patient Stated Goals Pt expresses desires to decrease her L foot symptoms.    Currently in Pain? No/denies   Pain Score 0-No pain   Pain Onset More than a month ago                                 PT Education - 02/23/17 1353    Education provided Yes   Education Details ther-ex   [employer]) Educated Patient   Methods Explanation;Demonstration;Tactile cues;Verbal cues   Comprehension Returned demonstration;Verbalized understanding        Objectives      There-ex  Seated press-ups on chair sitting on pillow 5x5 seconds for 2 sets  Standing L trunk side bend 10x5 seconds. For 2 sets   Air Ex Beam:             Forward and backward walking with one UE assist 10x2   Forward step up and over Air ex pad without UE assist 10x each LE to promote balance when on uneven surface. Decreased L ankle stability observed  SLS on L LE with R UE to no UE assist at times to work on L ankle stability and L glute med strength. 10x. L knee discomfort which eases with UE assist  Forward step up onto and over and backwards onto the Air Ex Pad with contralateral UE assist 10x2 each LE  SLS on L LE with R foot on bosu with 1 kg ball toss to trampoline to promote L ankle stability 20 throws x 3  Toe extension stretch against wall 2x5 with 10 second holds  Ankle rocker board DF/PF x 2 min to promote LE mobility  Walking over balance stones forward and back with one UE assist 5x each way. CGA    Improved exercise technique, movement at target joints, use of target muscles after min to mod verbal, visual, tactile cues.     Worked on L LE control on uneven surfaces, and toe extension ROM to help promote balance when pt ambulates on uneven ground. Difficulty with L ankle control on Air ex pad without UE assist. No change  in L foot symptoms today.         PT Long Term Goals - 02/01/17 1138      PT LONG TERM GOAL #1   Title Patient will have a decrease in L foot pain to 1/10 or less at most to promote ability to perform functional tasks such as walk, squat, and tolerate positions such as  standing or supine.    Baseline 4/10 L foot pain at most (02/01/2017)   Time 6   Period Weeks   Status New     PT LONG TERM GOAL #2   Title Patient will improve her LEFS score by 9 points as a demonstration of improved function.    Baseline 67/80 (02/01/2017)   Time 6   Period Weeks   Status New     PT LONG TERM GOAL #3   Title Patient will improve biliateral glute max strength by at least 1/2 MMT grade to promote ability to peform functional tasks with less back and L foot symptoms.    Time 6   Period Weeks   Status New     PT LONG TERM GOAL #4   Title Patient will improve her Modified Oswestry Low Back Pain Disability Questionnaire by at least 12% as a demonstration of improved function.    Baseline 28% (02/01/2017)   Time 6   Period Weeks   Status New               Plan - 02/23/17 1340    Clinical Impression Statement Worked on L LE control on uneven surfaces, and toe extension ROM to help promote balance when pt ambulates on uneven ground. Difficulty with L ankle control on Air ex pad without UE assist. No change in L foot symptoms today.    Rehab Potential Fair   Clinical Impairments Affecting Rehab Potential (-) chronicity of condition, MS, multiple health problems; (+) motivation   PT Frequency 2x / week   PT Duration 6 weeks   PT Treatment/Interventions Therapeutic activities;Therapeutic exercise;Patient/family education;Balance training;Neuromuscular re-education;Manual techniques;Dry needling;Aquatic Therapy;Electrical Stimulation;Iontophoresis 84m/ml Dexamethasone;Ultrasound;Traction  modalities if appropriate   PT Next Visit Plan posture, glute strengthening, scapular  strengthening/thoracic extension   Consulted and Agree with Plan of Care Patient      Patient will benefit from skilled therapeutic intervention in order to improve the following deficits and impairments:  Pain, Postural dysfunction, Improper body mechanics, Impaired sensation, Difficulty walking, Decreased strength  Visit Diagnosis: Pain in left foot  Muscle weakness (generalized)  Low back pain, unspecified back pain laterality, unspecified chronicity, with sciatica presence unspecified  Difficulty in walking, not elsewhere classified     Problem List Patient Active Problem List   Diagnosis Date Noted  . Microalbuminuria 01/12/2017  . Hyperlipidemia 01/12/2017  . Diabetes type 2, controlled ([place] 09/02/2016  . B12 deficiency 07/23/2016  . Primary localized osteoarthritis of left knee 11/22/2015  . History of colon polyps 10/10/2015  . Family history of colon cancer 10/10/2015  . Elevated fasting blood sugar 09/23/2015  . Restless leg 07/22/2015  . Insomnia 07/22/2015  . Fast heart beat 04/19/2015  . Peripheral neuropathy due to chemotherapy ([place] 03/19/2015  . Lumbar radiculopathy 03/19/2015  . Malignant neoplasm of breast ([place] 11/29/2013  . DS (disseminated sclerosis) ([place]   . Family history of cancer of digestive system     MJoneen BoersPT, DPT   02/23/2017, 9:11 PM  [facility] [address] [place] [address]Phone: [phone]  Fax:  [phone] Name: GMACKENSEY BOLTEMRN: [id number] of Birth: [birth year]

## 2017-02-23 NOTE — Patient Instructions (Addendum)
Side Bend    Stand with feet shoulder width apart, arms at sides. Bend to the left side only. Hold for 5 seconds. Return to standing.  Repeat sequence _10___ times per session. Do _3___ sessions per day   Copyright  VHI. All rights reserved.     Standing near a wall, slide your toes comfortably down the wall to feel a comfortable stretch.   Hold for 10 seconds.    Repeat 5 times.   Perform at least 3 times a day.

## 2017-03-02 ENCOUNTER — Ambulatory Visit: Payer: BLUE CROSS/BLUE SHIELD

## 2017-03-02 DIAGNOSIS — M545 Low back pain: Secondary | ICD-10-CM

## 2017-03-02 DIAGNOSIS — M79672 Pain in left foot: Secondary | ICD-10-CM

## 2017-03-02 DIAGNOSIS — R262 Difficulty in walking, not elsewhere classified: Secondary | ICD-10-CM

## 2017-03-02 DIAGNOSIS — M6281 Muscle weakness (generalized): Secondary | ICD-10-CM

## 2017-03-02 NOTE — Patient Instructions (Signed)
  Reviewed and given SLS on L LE with UE assist as needed 10x3 with 5 second holds daily so long as her back, hip and knee are ok with it. Pt demonstrated and verbalized understanding.

## 2017-03-02 NOTE — Therapy (Signed)
[place] [facility] [address] [place], [address] Phone: [phone]   Fax:  [phone]  Physical Therapy Treatment  Patient Details  Name: Cassidy Norris MRN: [id number] Date of Birth: 08/31/1951 Referring Provider: Metta Clines, DO  Encounter Date: 03/02/2017      PT End of Session - 03/02/17 1032    Visit Number 8   Number of Visits 13   Date for PT Re-Evaluation 03/18/17   PT Start Time 1032   PT Stop Time 1129   PT Time Calculation (min) 57 min   Activity Tolerance Patient tolerated treatment well   Behavior During Therapy [facility] for tasks assessed/performed      Past Medical History:  Diagnosis Date  . Anemia   . Cancer [facility]) 2014   right breast - Invasive Mammary Carcinoma  . Cancer [facility]) 2014   left breast - Invasive lobular carcinoma and DCIS   . Diabetes mellitus without complication ([place])   . Diffuse cystic mastopathy   . Family history of malignant neoplasm of gastrointestinal tract 2012  . Fractured elbow 08/28/14  . Hx of metabolic acidosis with increased anion gap   . Increased heart rate   . Multiple sclerosis ([place])   . Neuromuscular disorder ([place])    neuropathy in bil feet - left is worse.  . Obesity, unspecified   . Peripheral neuropathy ([place])   . Personal history of tobacco use, presenting hazards to health   . Restless leg syndrome   . Sleep apnea   . Special screening for malignant neoplasms, colon     Past Surgical History:  Procedure Laterality Date  . BREAST BIOPSY Right 1973,2001  . BREAST BIOPSY Right 11-07-13   INVASIVE MAMMARY CARCINOMA , ER/PR positive Her2 negative  . BREAST BIOPSY Left 11-27-13   invasive lobular and DCIS  . BREAST SURGERY Bilateral 01/09/14   mastectomy  . CHOLECYSTECTOMY    . COLONOSCOPY  2010   Dr. Jamal Collin  . COLONOSCOPY WITH PROPOFOL N/A 06/11/2015   Procedure: COLONOSCOPY WITH PROPOFOL;  Surgeon: Christene Lye, MD;  Location: [facility]  ENDOSCOPY;  Service: Endoscopy;  Laterality: N/A;  . DILATION AND CURETTAGE OF UTERUS  1983  . POLYPECTOMY  1989  . PORTACATH PLACEMENT  2015  . SPINE SURGERY  09/14/14   Ruptured disk L4- L5  . TOTAL KNEE ARTHROPLASTY Left 11/22/2015   Procedure: TOTAL KNEE ARTHROPLASTY;  Surgeon: Earlie Server, MD;  Location: [place];  Service: Orthopedics;  Laterality: Left;  . TUBAL LIGATION    . WISDOM TOOTH EXTRACTION  2006    There were no vitals filed for this visit.      Subjective Assessment - 03/02/17 1034    Subjective L foot and balance is about the same. No pain currently other than the usual knee and foot stuff.  Pt states that her balance is sometimes good, sometimes not. Has to pay attention to where she is walking.    Pertinent History Multiple sclerosis, low back pain. Pt states having neuropathy in her L foot and her L toes (except great toe) as well as her toes curling under which affects her balance.  Pt noticed symptoms 6 months ago when she noticed her balance was bothering her. Neuropathy started since her chemotherapy in 2015 which worsened after her knee surgery in 2016. Feels neuropathy mostly in her L foot, feels symptoms in R foot and both hands. Has a hx of a ruptured lumbar disc S/P surgery  09/14/2014.  Prior to her surgery pt states feeling L LE symptoms. Current symptoms slightly different (L foot symptoms started after her chemotherapy).  Back bothers her when she stands for a long time. Does not know if her pain is due to being off balance, trying to keep herself from falling while keeping good posture.  Denies unexplained changes in weight. Denies bowel or blader problems or saddle anesthesia.  Pt was diagnosed with MS in 2003. Was given steriod medication at that time. Pt states that she found that she had MS when she fell and had imaging. Had imaging recently but does not know the result.  Pt states having a little balance problem but no falls within the last 6 months. Balance  feels off however since the last month and it did not used to be that way.   Pt states that she is being followed by an oncologist and is currently cancer free as far as she knows.    Patient Stated Goals Pt expresses desires to decrease her L foot symptoms.    Currently in Pain? Yes   Pain Score 1   1.5/10; more of an aggravation compared to a pain.    Pain Onset More than a month ago                                 PT Education - 03/02/17 1109    Education provided Yes   Education Details ther-ex, HEP   Person(s) Educated Patient   Methods Explanation;Demonstration;Tactile cues;Verbal cues   Comprehension Verbalized understanding;Returned demonstration        Objectives    Manual therapy 11  STM to L plantar surface of foot with manual stretch to extend toes Gentle manual L LE neural flossing with gentle long axis traction  No changes   There-ex   Reviewed option of toe extension splint over the counter with pt to promote extension per pt inquiry from a previous session  Supine L knee extension in 90 degrees L hip flexion to promote neural flossing 10x3   Supine L ankle DF with L hip in flexion and knee in slight extension 10x. Medial plantar and lateral foot cramping sensation. Eases with rest.   Supine with L LE straight: ankle PF/DF 10x. Cramping with ankle PF. Eases with rest  Then DF 10x2 minimal to no cramping at ball of foot   Supine L hip extension isometrics with L LE straight 10x2 with 5 seconds  Air Ex Beam: Forward and backward walking with one UE assist 10x2    Forward step up onto and over Air ex pad without UE assist 10x2 each L  LE to promote balance when on uneven surface.   Lateral step ups onto Air Ex pad with L LE without UE assist 10x2 to promote ankle stability and balance     Walking over balance stones forward and back with one UE assist 5x2 each LE each way. CGA   SLS on L LE with R UE to no  UE assist at times to work on L ankle stability and L glute med strength. 10x 5 seconds     Improved exercise technique, movement at target joints, use of target muscles after min to mod verbal, visual, tactile cues.      Increased L foot cramp with L foot PF with leg straight and with ankle DF with L hip flexion and knee extension. Eases with rest.  No change in numbness of L foot. Continued working on LE balance especially with L LE on uneven surfaces. Demonstrates L ankle weakness with decreased control during single limb stance positions.           PT Long Term Goals - 02/01/17 1138      PT LONG TERM GOAL #1   Title Patient will have a decrease in L foot pain to 1/10 or less at most to promote ability to perform functional tasks such as walk, squat, and tolerate positions such as standing or supine.    Baseline 4/10 L foot pain at most (02/01/2017)   Time 6   Period Weeks   Status New     PT LONG TERM GOAL #2   Title Patient will improve her LEFS score by 9 points as a demonstration of improved function.    Baseline 67/80 (02/01/2017)   Time 6   Period Weeks   Status New     PT LONG TERM GOAL #3   Title Patient will improve biliateral glute max strength by at least 1/2 MMT grade to promote ability to peform functional tasks with less back and L foot symptoms.    Time 6   Period Weeks   Status New     PT LONG TERM GOAL #4   Title Patient will improve her Modified Oswestry Low Back Pain Disability Questionnaire by at least 12% as a demonstration of improved function.    Baseline 28% (02/01/2017)   Time 6   Period Weeks   Status New               Plan - 03/02/17 1124    Clinical Impression Statement Increased L foot cramp with L foot PF with leg straight and with ankle DF with L hip flexion and knee extension. Eases with rest. No change in numbness of L foot. Continued working on LE balance especially with L LE on uneven surfaces. Demonstrates L ankle weakness  with decreased control during single limb stance positions.    Rehab Potential Fair   Clinical Impairments Affecting Rehab Potential (-) chronicity of condition, MS, multiple health problems; (+) motivation   PT Frequency 2x / week   PT Duration 6 weeks   PT Treatment/Interventions Therapeutic activities;Therapeutic exercise;Patient/family education;Balance training;Neuromuscular re-education;Manual techniques;Dry needling;Aquatic Therapy;Electrical Stimulation;Iontophoresis 31m/ml Dexamethasone;Ultrasound;Traction  modalities if appropriate   PT Next Visit Plan posture, glute strengthening, scapular strengthening/thoracic extension   Consulted and Agree with Plan of Care Patient      Patient will benefit from skilled therapeutic intervention in order to improve the following deficits and impairments:  Pain, Postural dysfunction, Improper body mechanics, Impaired sensation, Difficulty walking, Decreased strength  Visit Diagnosis: Pain in left foot  Muscle weakness (generalized)  Low back pain, unspecified back pain laterality, unspecified chronicity, with sciatica presence unspecified  Difficulty in walking, not elsewhere classified     Problem List Patient Active Problem List   Diagnosis Date Noted  . Microalbuminuria 01/12/2017  . Hyperlipidemia 01/12/2017  . Diabetes type 2, controlled ([place] 09/02/2016  . B12 deficiency 07/23/2016  . Primary localized osteoarthritis of left knee 11/22/2015  . History of colon polyps 10/10/2015  . Family history of colon cancer 10/10/2015  . Elevated fasting blood sugar 09/23/2015  . Restless leg 07/22/2015  . Insomnia 07/22/2015  . Fast heart beat 04/19/2015  . Peripheral neuropathy due to chemotherapy ([facility] 03/19/2015  . Lumbar radiculopathy 03/19/2015  . Malignant neoplasm of breast ([place] 11/29/2013  . DS (disseminated sclerosis) (  [place])   . Family history of cancer of digestive system    Joneen Boers PT, DPT   03/02/2017, 11:49  AM  [place] [facility] [address] [place], [address] Phone: [phone]   Fax:  [phone]  Name: Cassidy Norris MRN: [id number] Date of Birth: 06-28-1951

## 2017-03-04 ENCOUNTER — Ambulatory Visit: Payer: BLUE CROSS/BLUE SHIELD

## 2017-03-04 DIAGNOSIS — M79672 Pain in left foot: Secondary | ICD-10-CM | POA: Diagnosis not present

## 2017-03-04 DIAGNOSIS — R262 Difficulty in walking, not elsewhere classified: Secondary | ICD-10-CM

## 2017-03-04 DIAGNOSIS — M545 Low back pain: Secondary | ICD-10-CM

## 2017-03-04 DIAGNOSIS — M6281 Muscle weakness (generalized): Secondary | ICD-10-CM

## 2017-03-04 NOTE — Patient Instructions (Signed)
SLS on L LE with R UE assist to work on L ankle stability and L glute med strength. 5x 5 seconds for 3 sets  Reviewed and given as part of her HEP. Handout provided. Pt demonstrated and verbalized understanding.

## 2017-03-04 NOTE — Therapy (Signed)
[place] [address] [place], [address] Phone: [phone]   Fax:  [phone]  Physical Therapy Treatment  Patient Details  Name: Cassidy Norris MRN: [id number] Date of Birth: Jun 09, 1951 Referring Provider: Metta Clines, DO  Encounter Date: 03/04/2017      PT End of Session - 03/04/17 0903    Visit Number 9   Number of Visits 13   Date for PT Re-Evaluation 03/18/17   PT Start Time 0903   PT Stop Time 0945   PT Time Calculation (min) 42 min   Activity Tolerance Patient tolerated treatment well   Behavior During Therapy [facility] for tasks assessed/performed      Past Medical History:  Diagnosis Date  . Anemia   . Cancer [facility]) 2014   right breast - Invasive Mammary Carcinoma  . Cancer [facility]) 2014   left breast - Invasive lobular carcinoma and DCIS   . Diabetes mellitus without complication ([place])   . Diffuse cystic mastopathy   . Family history of malignant neoplasm of gastrointestinal tract 2012  . Fractured elbow 08/28/14  . Hx of metabolic acidosis with increased anion gap   . Increased heart rate   . Multiple sclerosis ([place])   . Neuromuscular disorder ([place])    neuropathy in bil feet - left is worse.  . Obesity, unspecified   . Peripheral neuropathy ([place])   . Personal history of tobacco use, presenting hazards to health   . Restless leg syndrome   . Sleep apnea   . Special screening for malignant neoplasms, colon     Past Surgical History:  Procedure Laterality Date  . BREAST BIOPSY Right 1973,2001  . BREAST BIOPSY Right 11-07-13   INVASIVE MAMMARY CARCINOMA , ER/PR positive Her2 negative  . BREAST BIOPSY Left 11-27-13   invasive lobular and DCIS  . BREAST SURGERY Bilateral 01/09/14   mastectomy  . CHOLECYSTECTOMY    . COLONOSCOPY  2010   Dr. Jamal Collin  . COLONOSCOPY WITH PROPOFOL N/A 06/11/2015   Procedure: COLONOSCOPY WITH PROPOFOL;  Surgeon: Christene Lye, MD;  Location: [facility]  ENDOSCOPY;  Service: Endoscopy;  Laterality: N/A;  . DILATION AND CURETTAGE OF UTERUS  1983  . POLYPECTOMY  1989  . PORTACATH PLACEMENT  2015  . SPINE SURGERY  09/14/14   Ruptured disk L4- L5  . TOTAL KNEE ARTHROPLASTY Left 11/22/2015   Procedure: TOTAL KNEE ARTHROPLASTY;  Surgeon: Earlie Server, MD;  Location: [place];  Service: Orthopedics;  Laterality: Left;  . TUBAL LIGATION    . WISDOM TOOTH EXTRACTION  2006    There were no vitals filed for this visit.      Subjective Assessment - 03/04/17 0904    Subjective Foot is the same. Has a little bit of lower back pain today. Did a lot of standing and walking yesterday. Did not sleep good last night.  1/10 low back pain currently.  Most of the time, walking on uneven surfaces is about the same. When her mind is something else, she starts weaving because she is not paying attention.  Balance is fine when she pays attention.  Wheel chair ramps are challenging when she does not pay attention.     Pertinent History Multiple sclerosis, low back pain. Pt states having neuropathy in her L foot and her L toes (except great toe) as well as her toes curling under which affects her balance.  Pt noticed symptoms 6 months ago when she noticed her balance  was bothering her. Neuropathy started since her chemotherapy in 2015 which worsened after her knee surgery in 2016. Feels neuropathy mostly in her L foot, feels symptoms in R foot and both hands. Has a hx of a ruptured lumbar disc S/P surgery 09/14/2014.  Prior to her surgery pt states feeling L LE symptoms. Current symptoms slightly different (L foot symptoms started after her chemotherapy).  Back bothers her when she stands for a long time. Does not know if her pain is due to being off balance, trying to keep herself from falling while keeping good posture.  Denies unexplained changes in weight. Denies bowel or blader problems or saddle anesthesia.  Pt was diagnosed with MS in 2003. Was given steriod medication  at that time. Pt states that she found that she had MS when she fell and had imaging. Had imaging recently but does not know the result.  Pt states having a little balance problem but no falls within the last 6 months. Balance feels off however since the last month and it did not used to be that way.   Pt states that she is being followed by an oncologist and is currently cancer free as far as she knows.    Patient Stated Goals Pt expresses desires to decrease her L foot symptoms.    Currently in Pain? Yes   Pain Score 1    Pain Onset More than a month ago                                 PT Education - 03/04/17 1534    Education provided Yes   Education Details ther-ex   [employer]) Educated Patient   Methods Explanation;Demonstration;Tactile cues;Verbal cues   Comprehension Returned demonstration;Verbalized understanding        Objectives  Ther-ex  Gait on incline ramp up and down 2x  Then with R and L with head turns 2x up and down  Then with cervical flexion and extension up and down 2x   All with CGA   Gait over mulch around garden area 2x CGA   No LOB, slight decrease base of support going up at times at incline ramp  Forward step up onto and over Air ex pad with one UE to no UE assist 10x2 each L  LE to promote balance when on uneven surface.   SLS on L LE with R UE assist to work on L ankle stability and L glute med strength. 5x 5 seconds for 3 sets  Reviewed and given as part of her HEP. Handout provided. Pt demonstrated and verbalized understanding.    SLS on L LE with one UE assist with toe taps onto 2 small balance stones and 1 large balance stone 5x forward and back,  Then with light touch to no UE assist 5x forward and back  L knee discomfort which eases with rest    Improved exercise technique, movement at target joints, use of target muscles after min to mod verbal, visual, tactile cues.    No back pain towards end of session  suggesting improved symptoms with movement. Continued working on L ankle stability over uneven surfaces as well as walking on incline surfaces with head movements to promote balance. No LOB throughout session.         PT Long Term Goals - 02/01/17 1138      PT LONG TERM GOAL #1   Title Patient will have  a decrease in L foot pain to 1/10 or less at most to promote ability to perform functional tasks such as walk, squat, and tolerate positions such as standing or supine.    Baseline 4/10 L foot pain at most (02/01/2017)   Time 6   Period Weeks   Status New     PT LONG TERM GOAL #2   Title Patient will improve her LEFS score by 9 points as a demonstration of improved function.    Baseline 67/80 (02/01/2017)   Time 6   Period Weeks   Status New     PT LONG TERM GOAL #3   Title Patient will improve biliateral glute max strength by at least 1/2 MMT grade to promote ability to peform functional tasks with less back and L foot symptoms.    Time 6   Period Weeks   Status New     PT LONG TERM GOAL #4   Title Patient will improve her Modified Oswestry Low Back Pain Disability Questionnaire by at least 12% as a demonstration of improved function.    Baseline 28% (02/01/2017)   Time 6   Period Weeks   Status New               Plan - 03/04/17 9381    Clinical Impression Statement No back pain towards end of session suggesting improved symptoms with movement. Continued working on L ankle stability over uneven surfaces as well as walking on incline surfaces with head movements to promote balance. No LOB throughout session.   Rehab Potential Fair   Clinical Impairments Affecting Rehab Potential (-) chronicity of condition, MS, multiple health problems; (+) motivation   PT Frequency 2x / week   PT Duration 6 weeks   PT Treatment/Interventions Therapeutic activities;Therapeutic exercise;Patient/family education;Balance training;Neuromuscular re-education;Manual techniques;Dry  needling;Aquatic Therapy;Electrical Stimulation;Iontophoresis 27m/ml Dexamethasone;Ultrasound;Traction  modalities if appropriate   PT Next Visit Plan posture, glute strengthening, scapular strengthening/thoracic extension   Consulted and Agree with Plan of Care Patient      Patient will benefit from skilled therapeutic intervention in order to improve the following deficits and impairments:  Pain, Postural dysfunction, Improper body mechanics, Impaired sensation, Difficulty walking, Decreased strength  Visit Diagnosis: Pain in left foot  Muscle weakness (generalized)  Low back pain, unspecified back pain laterality, unspecified chronicity, with sciatica presence unspecified  Difficulty in walking, not elsewhere classified     Problem List Patient Active Problem List   Diagnosis Date Noted  . Microalbuminuria 01/12/2017  . Hyperlipidemia 01/12/2017  . Diabetes type 2, controlled ([place] 09/02/2016  . B12 deficiency 07/23/2016  . Primary localized osteoarthritis of left knee 11/22/2015  . History of colon polyps 10/10/2015  . Family history of colon cancer 10/10/2015  . Elevated fasting blood sugar 09/23/2015  . Restless leg 07/22/2015  . Insomnia 07/22/2015  . Fast heart beat 04/19/2015  . Peripheral neuropathy due to chemotherapy ([place] 03/19/2015  . Lumbar radiculopathy 03/19/2015  . Malignant neoplasm of breast ([place] 11/29/2013  . DS (disseminated sclerosis) ([place]   . Family history of cancer of digestive system     MJoneen BoersPT, DPT   03/04/2017, 3:43 PM  [facility] [address] [place] [id number]: [phone]  Fax:  [phone] Name: GMCKINLEE DUNKMRN: [id number] of Birth: [birth year]

## 2017-03-09 ENCOUNTER — Ambulatory Visit: Payer: BLUE CROSS/BLUE SHIELD

## 2017-03-09 DIAGNOSIS — M79672 Pain in left foot: Secondary | ICD-10-CM | POA: Diagnosis not present

## 2017-03-09 DIAGNOSIS — M545 Low back pain: Secondary | ICD-10-CM

## 2017-03-09 DIAGNOSIS — M6281 Muscle weakness (generalized): Secondary | ICD-10-CM

## 2017-03-09 DIAGNOSIS — R262 Difficulty in walking, not elsewhere classified: Secondary | ICD-10-CM

## 2017-03-09 NOTE — Patient Instructions (Signed)
Gave seated L LE neural flossing 10x3 daily as part of her HEP. Pt demonstrated and verbalized understanding.

## 2017-03-09 NOTE — Therapy (Signed)
[place] [facility] [address] [place], [address] Phone: [phone]   Fax:  [phone]  Physical Therapy Treatment  Patient Details  Name: Cassidy Norris MRN: [id number] Date of Birth: May 03, 1951 Referring Provider: Metta Clines, DO  Encounter Date: 03/09/2017      PT End of Session - 03/09/17 1347    Visit Number 10   Number of Visits 13   Date for PT Re-Evaluation 03/18/17   PT Start Time 7510   PT Stop Time 1445   PT Time Calculation (min) 58 min   Activity Tolerance Patient tolerated treatment well   Behavior During Therapy [facility] for tasks assessed/performed      Past Medical History:  Diagnosis Date  . Anemia   . Cancer [facility]) 2014   right breast - Invasive Mammary Carcinoma  . Cancer [facility]) 2014   left breast - Invasive lobular carcinoma and DCIS   . Diabetes mellitus without complication ([place])   . Diffuse cystic mastopathy   . Family history of malignant neoplasm of gastrointestinal tract 2012  . Fractured elbow 08/28/14  . Hx of metabolic acidosis with increased anion gap   . Increased heart rate   . Multiple sclerosis ([place])   . Neuromuscular disorder ([place])    neuropathy in bil feet - left is worse.  . Obesity, unspecified   . Peripheral neuropathy ([place])   . Personal history of tobacco use, presenting hazards to health   . Restless leg syndrome   . Sleep apnea   . Special screening for malignant neoplasms, colon     Past Surgical History:  Procedure Laterality Date  . BREAST BIOPSY Right 1973,2001  . BREAST BIOPSY Right 11-07-13   INVASIVE MAMMARY CARCINOMA , ER/PR positive Her2 negative  . BREAST BIOPSY Left 11-27-13   invasive lobular and DCIS  . BREAST SURGERY Bilateral 01/09/14   mastectomy  . CHOLECYSTECTOMY    . COLONOSCOPY  2010   Dr. Jamal Collin  . COLONOSCOPY WITH PROPOFOL N/A 06/11/2015   Procedure: COLONOSCOPY WITH PROPOFOL;  Surgeon: Christene Lye, MD;  Location: [facility]  ENDOSCOPY;  Service: Endoscopy;  Laterality: N/A;  . DILATION AND CURETTAGE OF UTERUS  1983  . POLYPECTOMY  1989  . PORTACATH PLACEMENT  2015  . SPINE SURGERY  09/14/14   Ruptured disk L4- L5  . TOTAL KNEE ARTHROPLASTY Left 11/22/2015   Procedure: TOTAL KNEE ARTHROPLASTY;  Surgeon: Earlie Server, MD;  Location: [place];  Service: Orthopedics;  Laterality: Left;  . TUBAL LIGATION    . WISDOM TOOTH EXTRACTION  2006    There were no vitals filed for this visit.      Subjective Assessment - 03/09/17 1349    Subjective Pt states that her eyes were dialated at her eye doctor appointment this morning. Slightly blurried.  Has not been able to order the toe extension splint yet, waiting on her sister to help her with it.   Back is not hurting today. The numbness is the same. Stretching her foot out (plantarflexion) makes it cramp (at the bottom and across the top). 1.5/10 L foot pain at worst for the most part. More of just an aggravation. 3/10 when it cramps. Better able to extend her toes when on her recliner.   1/10 low back pain at worst      Pertinent History Multiple sclerosis, low back pain. Pt states having neuropathy in her L foot and her L toes (except great toe) as well  as her toes curling under which affects her balance.  Pt noticed symptoms 6 months ago when she noticed her balance was bothering her. Neuropathy started since her chemotherapy in 2015 which worsened after her knee surgery in 2016. Feels neuropathy mostly in her L foot, feels symptoms in R foot and both hands. Has a hx of a ruptured lumbar disc S/P surgery 09/14/2014.  Prior to her surgery pt states feeling L LE symptoms. Current symptoms slightly different (L foot symptoms started after her chemotherapy).  Back bothers her when she stands for a long time. Does not know if her pain is due to being off balance, trying to keep herself from falling while keeping good posture.  Denies unexplained changes in weight. Denies bowel or  blader problems or saddle anesthesia.  Pt was diagnosed with MS in 2003. Was given steriod medication at that time. Pt states that she found that she had MS when she fell and had imaging. Had imaging recently but does not know the result.  Pt states having a little balance problem but no falls within the last 6 months. Balance feels off however since the last month and it did not used to be that way.   Pt states that she is being followed by an oncologist and is currently cancer free as far as she knows.    Patient Stated Goals Pt expresses desires to decrease her L foot symptoms.    Currently in Pain? No/denies   Pain Onset More than a month ago            [facility] PT Assessment - 03/09/17 1436      Observation/Other Assessments   Modified Oswertry 8%   Lower Extremity Functional Scale  60/80  stair negotiation, running, and hopping based on knee     Strength   Right Hip Extension 4+/5  no hamstring cramp   Left Hip Extension 4+/5  no cramps                             PT Education - 03/09/17 1416    Education provided Yes   Education Details ther-ex, HEP   Person(s) Educated Patient   Methods Explanation;Demonstration;Tactile cues;Verbal cues   Comprehension Returned demonstration;Verbalized understanding        Objectives     No pain with gastroc squeeze bilaterally   Manual therapy   STM to L gastroc secondary to plantar foot cramping with activation. No change in symptoms afterwards.   R S/L rotation mobilization grade 2 for lumbar. No change in L foot symptoms     Ther-ex   Supine bridge 5x3  Supine lower trunk rotation   5x5 seconds to the R for 3 sets  5x5 seconds to the L for 3 sets   Seated pallof press straight resisting red band 5x5 seconds  Then resisting double red band 10x5 seconds  T-band low rows 10x5 seconds for 2 sets  Prone glute max extension 1x each way  Seated L LE neural flossing (knee extension with  gentle trunk extension) 10x  Reviewed plan of care. Continue with HEP and toe extension splint (gradual extension when pt orders it) to continue progress after next session (plan for discharge).     Improved exercise technique, movement at target joints, use of target muscles after min to mod verbal, visual, tactile cues.    Overall, pt demonstrates slight improvement with toe extension (based on pt reports), improved bilateral glute  max strength (no bilateral hamstring cramping), and Modified Oswestry Low Back Pain Disability Questionnaire suggesting improved function since initial evaluation. Pt also scored a 21 for her DGI suggesting decreased fall risk. Pt still demonstrates L foot paresthesia in which chronicity of condition makes progress challenging. Continue skilled physical therapy services x 1 more visit to promote LE strength and function, and hopefully decrease L foot symptoms, then continue with HEP.         PT Long Term Goals - 03/09/17 1624      PT LONG TERM GOAL #1   Title Patient will have a decrease in L foot pain to 1/10 or less at most to promote ability to perform functional tasks such as walk, squat, and tolerate positions such as standing or supine.    Baseline 4/10 L foot pain at most (02/01/2017); 1.5/10 L foot pain at worst, 3/10 aggravation sensation at worst (03/09/2017)   Time 6   Period Weeks   Status Partially Met     PT LONG TERM GOAL #2   Title Patient will improve her LEFS score by 9 points as a demonstration of improved function.    Baseline 67/80 (02/01/2017); 60/80 LEFS (03/09/2017)   Time 6   Period Weeks   Status On-going     PT LONG TERM GOAL #3   Title Patient will improve biliateral glute max strength by at least 1/2 MMT grade to promote ability to peform functional tasks with less back and L foot symptoms.    Time 6   Period Weeks   Status Achieved     PT LONG TERM GOAL #4   Title Patient will improve her Modified Oswestry Low Back Pain  Disability Questionnaire by at least 12% as a demonstration of improved function.    Baseline 28% (02/01/2017); 8% (03/09/2017)   Time 6   Period Weeks   Status Achieved               Plan - 03/09/17 1416    Clinical Impression Statement Overall, pt demonstrates slight improvement with toe extension (based on pt reports), improved bilateral glute max strength (no bilateral hamstring cramping), and Modified Oswestry Low Back Pain Disability Questionnaire suggesting improved function since initial evaluation. Pt also scored a 21 for her DGI suggesting decreased fall risk. Pt still demonstrates L foot paresthesia in which chronicity of condition makes progress challenging. Continue skilled physical therapy services x 1 more visit to promote LE strength and function, and hopefully decrease L foot symptoms, then continue with HEP.    Rehab Potential Fair   Clinical Impairments Affecting Rehab Potential (-) chronicity of condition, MS, multiple health problems; (+) motivation   PT Frequency 2x / week   PT Duration 6 weeks   PT Treatment/Interventions Therapeutic activities;Therapeutic exercise;Patient/family education;Balance training;Neuromuscular re-education;Manual techniques;Dry needling;Aquatic Therapy;Electrical Stimulation;Iontophoresis 12m/ml Dexamethasone;Ultrasound;Traction  modalities if appropriate   PT Next Visit Plan posture, glute strengthening, scapular strengthening/thoracic extension   Consulted and Agree with Plan of Care Patient      Patient will benefit from skilled therapeutic intervention in order to improve the following deficits and impairments:  Pain, Postural dysfunction, Improper body mechanics, Impaired sensation, Difficulty walking, Decreased strength  Visit Diagnosis: Pain in left foot  Muscle weakness (generalized)  Low back pain, unspecified back pain laterality, unspecified chronicity, with sciatica presence unspecified  Difficulty in walking, not  elsewhere classified     Problem List Patient Active Problem List   Diagnosis Date Noted  . Microalbuminuria 01/12/2017  . Hyperlipidemia  01/12/2017  . Diabetes type 2, controlled ([place]) 09/02/2016  . B12 deficiency 07/23/2016  . Primary localized osteoarthritis of left knee 11/22/2015  . History of colon polyps 10/10/2015  . Family history of colon cancer 10/10/2015  . Elevated fasting blood sugar 09/23/2015  . Restless leg 07/22/2015  . Insomnia 07/22/2015  . Fast heart beat 04/19/2015  . Peripheral neuropathy due to chemotherapy ([place]) 03/19/2015  . Lumbar radiculopathy 03/19/2015  . Malignant neoplasm of breast ([place]) 11/29/2013  . DS (disseminated sclerosis) ([place])   . Family history of cancer of digestive system      Joneen Boers PT, DPT  03/09/2017, 7:14 PM  [facility] [facility] [address], [place], [address] Phone: [phone]   Fax:  [phone]  Name: Cassidy Norris MRN: [id number] Date of Birth: Apr 05, 1951

## 2017-03-11 ENCOUNTER — Ambulatory Visit: Payer: BLUE CROSS/BLUE SHIELD

## 2017-03-11 DIAGNOSIS — M79672 Pain in left foot: Secondary | ICD-10-CM

## 2017-03-11 DIAGNOSIS — M6281 Muscle weakness (generalized): Secondary | ICD-10-CM

## 2017-03-11 DIAGNOSIS — R262 Difficulty in walking, not elsewhere classified: Secondary | ICD-10-CM

## 2017-03-11 DIAGNOSIS — M545 Low back pain: Secondary | ICD-10-CM

## 2017-03-11 NOTE — Therapy (Signed)
[place] [address] [place], [address] Phone: [phone]   Fax:  [phone]  Physical Therapy Treatment And Discharge Summary  Patient Details  Name: Cassidy Norris MRN: [id number] Date of Birth: Jul 30, 1951 Referring Provider: Metta Clines, DO  Encounter Date: 03/11/2017      PT End of Session - 03/11/17 0947    Visit Number 11   Number of Visits 13   Date for PT Re-Evaluation 03/18/17   PT Start Time 0948   PT Stop Time 1030   PT Time Calculation (min) 42 min   Activity Tolerance Patient tolerated treatment well   Behavior During Therapy [facility] for tasks assessed/performed      Past Medical History:  Diagnosis Date  . Anemia   . Cancer [facility]) 2014   right breast - Invasive Mammary Carcinoma  . Cancer [facility]) 2014   left breast - Invasive lobular carcinoma and DCIS   . Diabetes mellitus without complication ([place])   . Diffuse cystic mastopathy   . Family history of malignant neoplasm of gastrointestinal tract 2012  . Fractured elbow 08/28/14  . Hx of metabolic acidosis with increased anion gap   . Increased heart rate   . Multiple sclerosis ([place])   . Neuromuscular disorder ([place])    neuropathy in bil feet - left is worse.  . Obesity, unspecified   . Peripheral neuropathy ([place])   . Personal history of tobacco use, presenting hazards to health   . Restless leg syndrome   . Sleep apnea   . Special screening for malignant neoplasms, colon     Past Surgical History:  Procedure Laterality Date  . BREAST BIOPSY Right 1973,2001  . BREAST BIOPSY Right 11-07-13   INVASIVE MAMMARY CARCINOMA , ER/PR positive Her2 negative  . BREAST BIOPSY Left 11-27-13   invasive lobular and DCIS  . BREAST SURGERY Bilateral 01/09/14   mastectomy  . CHOLECYSTECTOMY    . COLONOSCOPY  2010   Dr. Jamal Collin  . COLONOSCOPY WITH PROPOFOL N/A 06/11/2015   Procedure: COLONOSCOPY WITH PROPOFOL;  Surgeon: Christene Lye, MD;  Location: [facility] ENDOSCOPY;  Service: Endoscopy;  Laterality: N/A;  . DILATION AND CURETTAGE OF UTERUS  1983  . POLYPECTOMY  1989  . PORTACATH PLACEMENT  2015  . SPINE SURGERY  09/14/14   Ruptured disk L4- L5  . TOTAL KNEE ARTHROPLASTY Left 11/22/2015   Procedure: TOTAL KNEE ARTHROPLASTY;  Surgeon: Earlie Server, MD;  Location: [employer];  Service: Orthopedics;  Laterality: Left;  . TUBAL LIGATION    . WISDOM TOOTH EXTRACTION  2006    There were no vitals filed for this visit.      Subjective Assessment - 03/11/17 0948    Subjective Back is fine, L foot is the same. Balance is better.    Pertinent History Multiple sclerosis, low back pain. Pt states having neuropathy in her L foot and her L toes (except great toe) as well as her toes curling under which affects her balance.  Pt noticed symptoms 6 months ago when she noticed her balance was bothering her. Neuropathy started since her chemotherapy in 2015 which worsened after her knee surgery in 2016. Feels neuropathy mostly in her L foot, feels symptoms in R foot and both hands. Has a hx of a ruptured lumbar disc S/P surgery 09/14/2014.  Prior to her surgery pt states feeling L LE symptoms. Current symptoms slightly different (L foot symptoms started after her chemotherapy).  Back  bothers her when she stands for a long time. Does not know if her pain is due to being off balance, trying to keep herself from falling while keeping good posture.  Denies unexplained changes in weight. Denies bowel or blader problems or saddle anesthesia.  Pt was diagnosed with MS in 2003. Was given steriod medication at that time. Pt states that she found that she had MS when she fell and had imaging. Had imaging recently but does not know the result.  Pt states having a little balance problem but no falls within the last 6 months. Balance feels off however since the last month and it did not used to be that way.   Pt states that she is being followed by an  oncologist and is currently cancer free as far as she knows.    Patient Stated Goals Pt expresses desires to decrease her L foot symptoms.    Currently in Pain? No/denies   Pain Score 0-No pain   Pain Onset More than a month ago                                 PT Education - 03/11/17 1242    Education provided Yes   Education Details ther-ex, HEP   Person(s) Educated Patient   Methods Explanation;Demonstration;Tactile cues;Verbal cues;Handout   Comprehension Verbalized understanding;Returned demonstration       Objectives  There-ex  Seated L LE neural flossing in sitting 10x2 SLS on L LE with R foot on upside down bosu 1kg ball toss 20x3   Gait on incline ramp up and down 2x             Then with R and L with head turns 2x up and down             Then with cervical flexion and extension up and down 2x              All with SBA, no LOB  Forward step up onto and over Air ex pad with one UE to no UE assist 10x2each L LE to promote balance when on uneven surface.   Forward and backward walk on Air Ex beam with R UE assist 10x   SLS on L LE without UE assist with toe taps onto 2 small balance stones and 1 large balance stone 6x2 forward and back,            improved ability to perform activity. Better ankle control L LE. Slight L knee discomfort which eases with sitting rest.   T-band low rows 10x5 seconds for 2 sets resisting red band  Reviewed and given as part of her HEP (10x3 with 5 seconds daily). Pt demonstrate and verbalized understanding.    Seated pallof press straight resisting double red band 10x5 seconds  Reviewed and given as part of her HEP (10x3 with 5 second holds daily). Handout provided. Pt demonstrated and verbalized understanding.     Improved exercise technique, movement at target joints, use of target muscles after min to mod verbal, visual, tactile cues.    Improved L ankle control on uneven surfaces and when on  single limb standing compared to previous sessions. Overall, pt demonstrates slight improvement with toe extension (based on pt reports), improved bilateral glute max strength (no bilateral hamstring cramping), and Modified Oswestry Low Back Pain Disability Questionnaire suggesting improved function since initial evaluation. Pt also scored a 21 for  her DGI suggesting decreased fall risk. Pt still demonstrates L foot paresthesia in which chronicity of condition makes progress challenging. No change in foot symptoms since eval. Skilled physical therapy services discharged with pt continuing with improving strength, balance and toe extension ROM with her home exercise program.          PT Long Term Goals - 03/09/17 1624      PT LONG TERM GOAL #1   Title Patient will have a decrease in L foot pain to 1/10 or less at most to promote ability to perform functional tasks such as walk, squat, and tolerate positions such as standing or supine.    Baseline 4/10 L foot pain at most (02/01/2017); 1.5/10 L foot pain at worst, 3/10 aggravation sensation at worst (03/09/2017)   Time 6   Period Weeks   Status Partially Met     PT LONG TERM GOAL #2   Title Patient will improve her LEFS score by 9 points as a demonstration of improved function.    Baseline 67/80 (02/01/2017); 60/80 LEFS (03/09/2017)   Time 6   Period Weeks   Status On-going     PT LONG TERM GOAL #3   Title Patient will improve biliateral glute max strength by at least 1/2 MMT grade to promote ability to peform functional tasks with less back and L foot symptoms.    Time 6   Period Weeks   Status Achieved     PT LONG TERM GOAL #4   Title Patient will improve her Modified Oswestry Low Back Pain Disability Questionnaire by at least 12% as a demonstration of improved function.    Baseline 28% (02/01/2017); 8% (03/09/2017)   Time 6   Period Weeks   Status Achieved               Plan - 03/11/17 1031    Clinical Impression Statement  Improved L ankle control on uneven surfaces and when on single limb standing compared to previous sessions. Overall, pt demonstrates slight improvement with toe extension (based on pt reports), improved bilateral glute max strength (no bilateral hamstring cramping), and Modified Oswestry Low Back Pain Disability Questionnaire suggesting improved function since initial evaluation. Pt also scored a 21 for her DGI suggesting decreased fall risk. Pt still demonstrates L foot paresthesia in which chronicity of condition makes progress challenging. No change in foot symptoms since eval. Skilled physical therapy services discharged with pt continuing with improving strength, balance and toe extension ROM with her home exercise program.   Rehab Potential Fair   Clinical Impairments Affecting Rehab Potential (-) chronicity of condition, MS, multiple health problems; (+) motivation   PT Frequency --   PT Duration --   PT Treatment/Interventions Therapeutic activities;Therapeutic exercise;Patient/family education;Balance training;Neuromuscular re-education;Manual techniques  modalities if appropriate   PT Next Visit Plan Continue with her HEP.    Consulted and Agree with Plan of Care Patient      Patient will benefit from skilled therapeutic intervention in order to improve the following deficits and impairments:  Pain, Postural dysfunction, Improper body mechanics, Impaired sensation, Difficulty walking, Decreased strength  Visit Diagnosis: Pain in left foot  Muscle weakness (generalized)  Low back pain, unspecified back pain laterality, unspecified chronicity, with sciatica presence unspecified  Difficulty in walking, not elsewhere classified     Problem List Patient Active Problem List   Diagnosis Date Noted  . Microalbuminuria 01/12/2017  . Hyperlipidemia 01/12/2017  . Diabetes type 2, controlled ([place]) 09/02/2016  . B12 deficiency  07/23/2016  . Primary localized osteoarthritis of left knee  11/22/2015  . History of colon polyps 10/10/2015  . Family history of colon cancer 10/10/2015  . Elevated fasting blood sugar 09/23/2015  . Restless leg 07/22/2015  . Insomnia 07/22/2015  . Fast heart beat 04/19/2015  . Peripheral neuropathy due to chemotherapy ([place]) 03/19/2015  . Lumbar radiculopathy 03/19/2015  . Malignant neoplasm of breast ([place]) 11/29/2013  . DS (disseminated sclerosis) ([place])   . Family history of cancer of digestive system      Thank you for your referral.   Joneen Boers PT, DPT   03/11/2017, 12:55 PM  [place] [facility] [address], [place], [address] Phone: [phone]   Fax:  [phone]  Name: Cassidy Norris MRN: [id number] Date of Birth: [birth year]

## 2017-03-11 NOTE — Patient Instructions (Signed)
Gave T-band (red) low rows 10x3 with 5 second holds daily  And seated pallof press straight resisting double red band 10x3 with 5 second holds daily (handout provided) as part of her HEP. Pt demonstrated and verbalized understanding.

## 2017-06-18 ENCOUNTER — Other Ambulatory Visit: Payer: Self-pay | Admitting: Family Medicine

## 2017-06-18 ENCOUNTER — Other Ambulatory Visit: Payer: Self-pay | Admitting: Oncology

## 2017-06-18 DIAGNOSIS — R000 Tachycardia, unspecified: Secondary | ICD-10-CM

## 2017-06-18 NOTE — Telephone Encounter (Signed)
Routing to provider. Appt on 07/13/17.

## 2017-07-13 ENCOUNTER — Encounter: Payer: Self-pay | Admitting: Unknown Physician Specialty

## 2017-07-13 ENCOUNTER — Ambulatory Visit (INDEPENDENT_AMBULATORY_CARE_PROVIDER_SITE_OTHER): Payer: BLUE CROSS/BLUE SHIELD | Admitting: Unknown Physician Specialty

## 2017-07-13 VITALS — BP 132/84 | HR 90 | Temp 98.2°F | Ht 65.3 in | Wt 203.6 lb

## 2017-07-13 DIAGNOSIS — E781 Pure hyperglyceridemia: Secondary | ICD-10-CM

## 2017-07-13 DIAGNOSIS — E119 Type 2 diabetes mellitus without complications: Secondary | ICD-10-CM | POA: Diagnosis not present

## 2017-07-13 DIAGNOSIS — E785 Hyperlipidemia, unspecified: Secondary | ICD-10-CM

## 2017-07-13 DIAGNOSIS — R5382 Chronic fatigue, unspecified: Secondary | ICD-10-CM | POA: Diagnosis not present

## 2017-07-13 DIAGNOSIS — E1169 Type 2 diabetes mellitus with other specified complication: Secondary | ICD-10-CM | POA: Diagnosis not present

## 2017-07-13 LAB — BAYER DCA HB A1C WAIVED: HB A1C (BAYER DCA - WAIVED): 6.5 % (ref <7.0)

## 2017-07-13 MED ORDER — METFORMIN HCL 500 MG PO TABS: 500.0000 mg | ORAL_TABLET | Freq: Two times a day (BID) | ORAL | 3 refills | Status: DC

## 2017-07-13 NOTE — Assessment & Plan Note (Signed)
Reviewed ASCVD risk of 10.6%.  Pt refusing statin.

## 2017-07-13 NOTE — Progress Notes (Signed)
----------+-+----------------------------------------------------------------------------------------+++  BP 132/84   Pulse 90   Temp 98.2 F (36.8 C)   Ht 5' 5.3" (1.659 m)   Wt 203 lb 9.6 oz (92.4 kg)   LMP 12/22/1999 (Approximate)   SpO2 97%   BMI 33.57 kg/m    Subjective:    Patient ID: Cassidy Norris, female    DOB: Feb 25, 1951, 66 y.o.   MRN: [id number]  HPI: Cassidy Norris is a 66 y.o. female  Chief Complaint  Patient presents with  . Diabetes  . Hypertension  . Labs Only    pt states she would like her thyroid checked, she states she has not been losing weight, hair loss, fatigued, and lots of sweating   Diabetes: Diet controlled not on medication No hypoglycemic episodes No hyperglycemic episodes Feet problems:history of neuropathy from chemo, LS disk, and MS.  Left worse than right Blood Sugars averaging: always high in the morning but comes down during the day eye exam within last year Last Hgb A1C:6.4  Hypertension  Using medications without difficulty Average home BPs   Using medication without problems or lightheadedness No chest pain with exertion or shortness of breath No Edema  Cholesterol Not taking medications for this Last LDL is below 100 The 10-year ASCVD risk score Cassidy Norris., et al., 2013) is: 10.6%   Values used to calculate the score:     Age: 23 years     Sex: Female     Is Non-Hispanic African American: No     Diabetic: Yes     Tobacco smoker: No     Systolic Blood Pressure: 295 mmHg     Is BP treated: No     HDL Cholesterol: 37 mg/dL     Total Cholesterol: 159 mg/dL   Fatigue States she is tired all the time.  Hair thinning, sweating, and has difficulty sleeping through the night.  Trouble losing weight despite walking a mile 4 days/week.  Has a CPAP  Relevant past medical, surgical, family and social history reviewed and updated as indicated. Interim medical history since our last visit reviewed. Allergies and  medications reviewed and updated.  Review of Systems  Per HPI unless specifically indicated above     Objective:    BP 132/84   Pulse 90   Temp 98.2 F (36.8 C)   Ht 5' 5.3" (1.659 m)   Wt 203 lb 9.6 oz (92.4 kg)   LMP 12/22/1999 (Approximate)   SpO2 97%   BMI 33.57 kg/m   Wt Readings from Last 3 Encounters:  07/13/17 203 lb 9.6 oz (92.4 kg)  01/20/17 201 lb 4.5 oz (91.3 kg)  01/12/17 201 lb 12.8 oz (91.5 kg)    Physical Exam  Constitutional: She is oriented to person, place, and time. She appears well-developed and well-nourished. No distress.  HENT:  Head: Normocephalic and atraumatic.  Eyes: Conjunctivae and lids are normal. Right eye exhibits no discharge. Left eye exhibits no discharge. No scleral icterus.  Neck: Normal range of motion. Neck supple. No JVD present. Carotid bruit is not present.  Cardiovascular: Normal rate, regular rhythm and normal heart sounds.   Pulmonary/Chest: Effort normal and breath sounds normal.  Abdominal: Normal appearance. There is no splenomegaly or hepatomegaly.  Musculoskeletal: Normal range of motion.  Neurological: She is alert and oriented to person, place, and time.  Skin: Skin is warm, dry and intact. No rash noted. No pallor.  Psychiatric: She has a normal mood and affect. Her behavior is normal. Judgment  and thought content normal.    Results for orders placed or performed in visit on 01/12/17  Lipid Panel Piccolo, [employer]  Result Value Ref Range   Cholesterol Piccolo, Waived 159 <200 mg/dL   HDL Chol Piccolo, Waived 37 (L) >59 mg/dL   Triglycerides Piccolo,Waived 172 (H) <150 mg/dL   Chol/HDL Ratio Piccolo,Waive 4.3 mg/dL   LDL Chol Calc Piccolo Waived 87 <100 mg/dL   VLDL Chol Calc Piccolo,Waive 34 (H) <30 mg/dL  Bayer DCA Hb A1c Waived  Result Value Ref Range   Bayer DCA Hb A1c Waived 6.4 <7.0 %  Comprehensive metabolic panel  Result Value Ref Range   Glucose 108 (H) 65 - 99 mg/dL   BUN 18 8 - 27 mg/dL   Creatinine,  Ser 0.72 0.57 - 1.00 mg/dL   GFR calc non Af Amer 89 >59   GFR calc Af Amer 102 >59   BUN/Creatinine Ratio 25 12 - 28   Sodium 140 134 - 144 mmol/L   Potassium 4.3 3.5 - 5.2 mmol/L   Chloride 102 96 - 106 mmol/L   CO2 25 18 - 29 mmol/L   Calcium 9.8 8.7 - 10.3 mg/dL   Total Protein 7.3 6.0 - 8.5 g/dL   Albumin 4.6 3.6 - 4.8 g/dL   Globulin, Total 2.7 1.5 - 4.5   Albumin/Globulin Ratio 1.7 1.2 - 2.2   Bilirubin Total 0.4 0.0 - 1.2 mg/dL   Alkaline Phosphatase 73 39 - 117 IU/L   AST 21 0 - 40 IU/L   ALT 28 0 - 32 IU/L  Microalbumin, Urine Waived  Result Value Ref Range   Microalb, Ur Waived 30 (H) 0 - 19 mg/L   Creatinine, Urine Waived 200 10 - 300 mg/dL   Microalb/Creat Ratio <30 <30 mg/g      Assessment & Plan:   Problem List Items Addressed This Visit      Unprioritized   Diabetes type 2, controlled ([place]) - Primary    Hgb A1C 6.5%.  Will start Metformin to see if it stabilizes blood sugar as well as helps with weight loss      Relevant Medications   metFORMIN (GLUCOPHAGE) 500 MG tablet   Other Relevant Orders   Bayer DCA Hb A1c Waived   RESOLVED: Hyperlipidemia   Hyperlipidemia associated with type 2 diabetes mellitus ([place])    Reviewed ASCVD risk of 10.6%.  Pt refusing statin.      Relevant Medications   metFORMIN (GLUCOPHAGE) 500 MG tablet    Other Visit Diagnoses    Chronic fatigue       Relevant Orders   TSH   Comprehensive metabolic panel   CBC with Differential/Platelet       Follow up plan: Return in about 3 months (around 10/13/2017).

## 2017-07-13 NOTE — Assessment & Plan Note (Signed)
Hgb A1C 6.5%.  Will start Metformin to see if it stabilizes blood sugar as well as helps with weight loss

## 2017-07-14 LAB — COMPREHENSIVE METABOLIC PANEL
ALBUMIN: 4.4 g/dL (ref 3.6–4.8)
ALT: 22 IU/L (ref 0–32)
AST: 21 IU/L (ref 0–40)
Albumin/Globulin Ratio: 1.7 (ref 1.2–2.2)
Alkaline Phosphatase: 72 IU/L (ref 39–117)
BUN/Creatinine Ratio: 20 (ref 12–28)
BUN: 17 mg/dL (ref 8–27)
Bilirubin Total: 0.8 mg/dL (ref 0.0–1.2)
CHLORIDE: 102 mmol/L (ref 96–106)
CO2: 22 mmol/L (ref 20–29)
Calcium: 10.2 mg/dL (ref 8.7–10.3)
Creatinine, Ser: 0.86 mg/dL (ref 0.57–1.00)
GFR, EST AFRICAN AMERICAN: 83 mL/min/{1.73_m2} (ref >59)
GFR, EST NON AFRICAN AMERICAN: 72 mL/min/{1.73_m2} (ref >59)
GLOBULIN, TOTAL: 2.6 g/dL (ref 1.5–4.5)
Glucose: 113 mg/dL — ABNORMAL HIGH (ref 65–99)
POTASSIUM: 4.4 mmol/L (ref 3.5–5.2)
SODIUM: 141 mmol/L (ref 134–144)
TOTAL PROTEIN: 7 g/dL (ref 6.0–8.5)

## 2017-07-14 LAB — CBC WITH DIFFERENTIAL/PLATELET
BASOS ABS: 0 10*3/uL (ref 0.0–0.2)
BASOS: 0 %
EOS (ABSOLUTE): 0.2 10*3/uL (ref 0.0–0.4)
Eos: 3 %
Hematocrit: 46.7 % — ABNORMAL HIGH (ref 34.0–46.6)
Hemoglobin: 15.5 g/dL (ref 11.1–15.9)
IMMATURE GRANS (ABS): 0 10*3/uL (ref 0.0–0.1)
Immature Granulocytes: 0 %
LYMPHS: 25 %
Lymphocytes Absolute: 2.1 10*3/uL (ref 0.7–3.1)
MCH: 31.3 pg (ref 26.6–33.0)
MCHC: 33.2 g/dL (ref 31.5–35.7)
MCV: 94 fL (ref 79–97)
MONOS ABS: 0.7 10*3/uL (ref 0.1–0.9)
Monocytes: 9 %
NEUTROS ABS: 5.4 10*3/uL (ref 1.4–7.0)
Neutrophils: 63 %
PLATELETS: 211 10*3/uL (ref 150–379)
RBC: 4.95 x10E6/uL (ref 3.77–5.28)
RDW: 13.1 % (ref 12.3–15.4)
WBC: 8.5 10*3/uL (ref 3.4–10.8)

## 2017-07-14 LAB — TSH: TSH: 1.39 u[IU]/mL (ref 0.450–4.500)

## 2017-07-14 NOTE — Progress Notes (Signed)
Normal labs.  Pt notified through mychart

## 2017-07-18 NOTE — Progress Notes (Signed)
[place]  [phone]  [phone]     Cassidy Norris DOB: Nov 30, 1950  MR#: [id number]  PJK#:[id number]  Patient Care Team: Kathrine Haddock, NP as PCP - General (Nurse Practitioner) Christene Lye, MD (General Surgery) Guadalupe Maple, MD (Family Medicine) Forest Gleason, MD (Oncology)  CHIEF COMPLAINT: Bilateral adenocarcinoma of the breast.  INTERVAL HISTORY:   Patient returns to clinic today for routine six-month follow-up. She is tolerating Aromasin well without significant side effects. She currently feels well and is asymptomatic. She has no neurologic complaints. She denies any recent fevers or illnesses. She has a good appetite and denies weight loss. She has no chest pain or shortness of breath. She denies any nausea, vomiting, constipation, or diarrhea. She has no urinary complaints. Patient feels at her baseline and offers no specific complaints today.   REVIEW OF SYSTEMS:   Review of Systems  Constitutional: Negative for fever, malaise/fatigue and weight loss.  Respiratory: Negative.  Negative for cough and shortness of breath.   Cardiovascular: Negative.  Negative for chest pain and leg swelling.  Gastrointestinal: Negative.  Negative for abdominal pain.  Genitourinary: Negative.   Musculoskeletal: Negative.   Skin: Negative.  Negative for rash.  Neurological: Negative.  Negative for sensory change and weakness.  Psychiatric/Behavioral: Negative.  The patient is not nervous/anxious.     As per HPI. Otherwise, a complete review of systems is negative.  ONCOLOGY HISTORY: Oncology History   1. Bilateral carcinoma of breast, February, 2015. Right breast status post radical mastectomy. T1 C. N0M0 (isolated tumor cells in one lymph node) multifocal invasive cancer. Stage IC, Left breast, Status post radical mastectomy, T2, N1, M0 tumor. Stage II, RIght breast. Both tumors are estrogen receptor positive.  Progesterone receptor  positive.  HER-2 receptor negative by FISH. Negative for BRCA mutation.  2. Started on Adriamycin and Cytoxan on March 01, 2014. Last cycle of Cytoxan and Adriamycin on May 01, 2014. Patient has finished last chemotherapy on September 9. (12 th dose was omitted because of neuropathy) 3.  Taking tamoxifen.  Patient had a poor tolerance to letrozole.(October, 2015) 4.  Patient is taking letrozole since November of 2015 5.  May, 2016 Letrozole has been put on hold because of bony pains 6.  Started on Aromasin from July of 2016      Malignant neoplasm of breast ([place])   11/29/2013 Initial Diagnosis    Breast cancer bilateral, multifocal ER/PR pos, Her 2 neg,.       PAST MEDICAL HISTORY: Past Medical History:  Diagnosis Date  . Anemia   . Cancer [facility]) 2014   right breast - Invasive Mammary Carcinoma  . Cancer [facility]) 2014   left breast - Invasive lobular carcinoma and DCIS   . Diabetes mellitus without complication ([place])   . Diffuse cystic mastopathy   . Family history of malignant neoplasm of gastrointestinal tract 2012  . Fractured elbow 08/28/14  . Hx of metabolic acidosis with increased anion gap   . Increased heart rate   . Multiple sclerosis ([place])   . Neuromuscular disorder ([place])    neuropathy in bil feet - left is worse.  . Obesity, unspecified   . Peripheral neuropathy   . Personal history of tobacco use, presenting hazards to health   . Restless leg syndrome   . Sleep apnea   . Special screening for malignant neoplasms, colon     PAST SURGICAL HISTORY: Past Surgical History:  Procedure Laterality Date  . BREAST BIOPSY  Right [id number]  . BREAST BIOPSY Right 11-07-13   INVASIVE MAMMARY CARCINOMA , ER/PR positive Her2 negative  . BREAST BIOPSY Left 11-27-13   invasive lobular and DCIS  . BREAST SURGERY Bilateral 01/09/14   mastectomy  . CHOLECYSTECTOMY    . COLONOSCOPY  2010   Dr. Jamal Collin  . COLONOSCOPY WITH PROPOFOL N/A 06/11/2015   Procedure: COLONOSCOPY WITH  PROPOFOL;  Surgeon: Christene Lye, MD;  Location: [facility] ENDOSCOPY;  Service: Endoscopy;  Laterality: N/A;  . DILATION AND CURETTAGE OF UTERUS  1983  . POLYPECTOMY  1989  . PORTACATH PLACEMENT  2015  . SPINE SURGERY  09/14/14   Ruptured disk L4- L5  . TOTAL KNEE ARTHROPLASTY Left 11/22/2015   Procedure: TOTAL KNEE ARTHROPLASTY;  Surgeon: Earlie Server, MD;  Location: [place];  Service: Orthopedics;  Laterality: Left;  . TUBAL LIGATION    . WISDOM TOOTH EXTRACTION  2006    FAMILY HISTORY Family History  Problem Relation Age of Onset  . Cancer Mother        lung  . Cancer Father        colon, stomach  . Diabetes Maternal Grandmother   . Hypertension Sister   . Rectal cancer Paternal Uncle   . Rectal cancer Paternal Uncle     GYNECOLOGIC HISTORY:  Patient's last menstrual period was 12/22/1999 (approximate).     ADVANCED DIRECTIVES:    HEALTH MAINTENANCE: Social History  Substance Use Topics  . Smoking status: Former Smoker    Packs/day: 1.00    Years: 30.00    Types: Cigarettes    Quit date: 12/11/2013  . Smokeless tobacco: Never Used  . Alcohol use No     Comment: social     Colonoscopy:  PAP:  Bone density:  Mammogram:  No Known Allergies  Current Outpatient Prescriptions  Medication Sig Dispense Refill  . acetaminophen (TYLENOL) 325 MG tablet Take 650 mg by mouth every 6 (six) hours as needed.    Marland Kitchen BAYER CONTOUR NEXT TEST test strip 1 each by Other route daily.   3  . Calcium Carbonate (CALCIUM 600 PO) Take 1 tablet by mouth 2 (two) times daily.    . carvedilol (COREG) 3.125 MG tablet TAKE 1 TABLET BY MOUTH TWICE DAILY WITH A MEAL 180 tablet 0  . Cholecalciferol (VITAMIN D) 2000 UNITS tablet Take 4,000 Units by mouth daily.    . Cyanocobalamin (VITAMIN B-12) 5000 MCG TBDP Take 5,000 mcg by mouth daily.    . diphenhydrAMINE (BENADRYL) 25 MG tablet Take 50 mg by mouth at bedtime as needed.    Marland Kitchen exemestane (AROMASIN) 25 MG tablet TAKE 1 TABLET BY MOUTH  DAILY AFTER BREAKFAST 90 tablet 0  . famotidine (PEPCID) 20 MG tablet Take 20 mg by mouth daily.    . fluticasone (FLONASE) 50 MCG/ACT nasal spray Place 2 sprays into both nostrils at bedtime.   1  . meloxicam (MOBIC) 15 MG tablet Take 15 mg by mouth daily.  2  . metFORMIN (GLUCOPHAGE) 500 MG tablet Take 1 tablet (500 mg total) by mouth 2 (two) times daily with a meal. For first 2 weeks, just take 1 in the AM and then increase to BID 180 tablet 3  . MICROLET LANCETS MISC   3  . pramipexole (MIRAPEX) 0.5 MG tablet TAKE 1 TABLET BY MOUTH AT BEDTIME 90 tablet 0  . traZODone (DESYREL) 50 MG tablet Take 2 tablets (100 mg total) by mouth at bedtime. 180 tablet 3   No current  facility-administered medications for this visit.     OBJECTIVE: BP 138/84 (Patient Position: Sitting)   Pulse (!) 111   Temp 97.8 F (36.6 C) (Tympanic)   Wt 208 lb (94.3 kg)   LMP 12/22/1999 (Approximate)   BMI 34.30 kg/m    Body mass index is 34.3 kg/m.    ECOG FS:0 - Asymptomatic  General: Well-developed, well-nourished, no acute distress. Eyes: Pink conjunctiva, anicteric sclera. Breasts: Patient has bilateral mastectomies. Lungs: Clear to auscultation bilaterally. Heart: Regular rate and rhythm. No rubs, murmurs, or gallops. Abdomen: Soft, nontender, nondistended. No organomegaly noted, normoactive bowel sounds. Musculoskeletal: No edema, cyanosis, or clubbing. Neuro: Alert, answering all questions appropriately. Cranial nerves grossly intact. Skin: No rashes or petechiae noted. Psych: Normal affect.   LAB RESULTS:  No visits with results within 3 Day(s) from this visit.  Latest known visit with results is:  Office Visit on 07/13/2017  Component Date Value Ref Range Status  . Bayer DCA Hb A1c Waived 07/13/2017 6.5  <7.0 % Final   Comment:                                       Diabetic Adult            <7.0                                       Healthy Adult        4.3 - 5.7                                                            (DCCT/NGSP) American Diabetes Association's Summary of Glycemic Recommendations for Adults with Diabetes: Hemoglobin A1c <7.0%. More stringent glycemic goals (A1c <6.0%) may further reduce complications at the cost of increased risk of hypoglycemia.   Marland Kitchen TSH 07/13/2017 1.390  0.450 - 4.500 uIU/mL Final  . Glucose 07/13/2017 113* 65 - 99 mg/dL Final  . BUN 07/13/2017 17  8 - 27 mg/dL Final  . Creatinine, Ser 07/13/2017 0.86  0.57 - 1.00 mg/dL Final  . GFR calc non Af Amer 07/13/2017 72  >59 mL/min/1.73 Final  . GFR calc Af Amer 07/13/2017 83  >59 mL/min/1.73 Final  . BUN/Creatinine Ratio 07/13/2017 20  12 - 28 Final  . Sodium 07/13/2017 141  134 - 144 mmol/L Final  . Potassium 07/13/2017 4.4  3.5 - 5.2 mmol/L Final  . Chloride 07/13/2017 102  96 - 106 mmol/L Final  . CO2 07/13/2017 22  20 - 29 mmol/L Final  . Calcium 07/13/2017 10.2  8.7 - 10.3 mg/dL Final  . Total Protein 07/13/2017 7.0  6.0 - 8.5 g/dL Final  . Albumin 07/13/2017 4.4  3.6 - 4.8 g/dL Final  . Globulin, Total 07/13/2017 2.6  1.5 - 4.5 g/dL Final  . Albumin/Globulin Ratio 07/13/2017 1.7  1.2 - 2.2 Final  . Bilirubin Total 07/13/2017 0.8  0.0 - 1.2 mg/dL Final  . Alkaline Phosphatase 07/13/2017 72  39 - 117 IU/L Final  . AST 07/13/2017 21  0 - 40 IU/L Final  . ALT 07/13/2017 22  0 - 32 IU/L Final  .  WBC 07/13/2017 8.5  3.4 - 10.8 x10E3/uL Final  . RBC 07/13/2017 4.95  3.77 - 5.28 x10E6/uL Final  . Hemoglobin 07/13/2017 15.5  11.1 - 15.9 g/dL Final  . Hematocrit 07/13/2017 46.7* 34.0 - 46.6 % Final  . MCV 07/13/2017 94  79 - 97 fL Final  . MCH 07/13/2017 31.3  26.6 - 33.0 pg Final  . MCHC 07/13/2017 33.2  31.5 - 35.7 g/dL Final  . RDW 07/13/2017 13.1  12.3 - 15.4 % Final  . Platelets 07/13/2017 211  150 - 379 x10E3/uL Final  . Neutrophils 07/13/2017 63  Not Estab. % Final  . Lymphs 07/13/2017 25  Not Estab. % Final  . Monocytes 07/13/2017 9  Not Estab. % Final  . Eos 07/13/2017 3  Not  Estab. % Final  . Basos 07/13/2017 0  Not Estab. % Final  . Neutrophils Absolute 07/13/2017 5.4  1.4 - 7.0 x10E3/uL Final  . Lymphocytes Absolute 07/13/2017 2.1  0.7 - 3.1 x10E3/uL Final  . Monocytes Absolute 07/13/2017 0.7  0.1 - 0.9 x10E3/uL Final  . EOS (ABSOLUTE) 07/13/2017 0.2  0.0 - 0.4 x10E3/uL Final  . Basophils Absolute 07/13/2017 0.0  0.0 - 0.2 x10E3/uL Final  . Immature Granulocytes 07/13/2017 0  Not Estab. % Final  . Immature Grans (Abs) 07/13/2017 0.0  0.0 - 0.1 x10E3/uL Final    STUDIES: No results found.  ASSESSMENT: Bilateral adenocarcinoma of the breast (right breast, stage I, T1c N0 M0 (isolated tumor cells in 1 lymph node; left breast, stage II, T2 N1 M0.)  PLAN:    1. Bilateral adenocarcinoma of the breast: Patient is status post bilateral radical mastectomies in February 2015. She also received adjuvant chemotherapy with Adriamycin, Cytoxan, and Taxol completed in approximately October 2015. She then initiated letrozole, but could not tolerate secondary to leg pain and was switched to Aromasin. Patient states she is switching insurances this coming October and will no longer be able to afford Aromasin. She was previously on tamoxifen briefly and tolerated this well, therefore will switch her to tamoxifen in October 2019 and she will complete her 5 years of treatment in October 2021. She has been instructed to call or come in to clinic with her new insurance information in several months. Patient does not require mammograms given her bilateral mastectomy. Return to clinic in 6 months for routine evaluation. 2. Postmenopausal: Bone mineral density on August 01, 2016 reveald a T-score of -1.2. Patient has been instructed to continue her calcium and vitamin D supplementation. Continue monitoring bone mineral density by PCP.   Patient expressed understanding and was in agreement with this plan. She also understands that She can call clinic at any time with any questions,  concerns, or complaints.   Breast cancer bilateral, multifocal ER/PR pos, Her 2 neg,.   Staging form: Breast, AJCC 7th Edition     Clinical: Stage IIB (T2, N1, M0) - Signed by Forest Gleason, MD on 04/22/2015   Cassidy Huger, MD   07/20/2017 2:12 PM

## 2017-07-20 ENCOUNTER — Inpatient Hospital Stay: Payer: BLUE CROSS/BLUE SHIELD | Attending: Oncology | Admitting: Oncology

## 2017-07-20 ENCOUNTER — Encounter: Payer: Self-pay | Admitting: Oncology

## 2017-07-20 VITALS — BP 138/84 | HR 111 | Temp 97.8°F | Wt 208.0 lb

## 2017-07-20 DIAGNOSIS — E669 Obesity, unspecified: Secondary | ICD-10-CM | POA: Insufficient documentation

## 2017-07-20 DIAGNOSIS — G4730 Sleep apnea, unspecified: Secondary | ICD-10-CM | POA: Insufficient documentation

## 2017-07-20 DIAGNOSIS — G2581 Restless legs syndrome: Secondary | ICD-10-CM | POA: Insufficient documentation

## 2017-07-20 DIAGNOSIS — Z9221 Personal history of antineoplastic chemotherapy: Secondary | ICD-10-CM | POA: Insufficient documentation

## 2017-07-20 DIAGNOSIS — Z9013 Acquired absence of bilateral breasts and nipples: Secondary | ICD-10-CM | POA: Insufficient documentation

## 2017-07-20 DIAGNOSIS — Z7984 Long term (current) use of oral hypoglycemic drugs: Secondary | ICD-10-CM | POA: Diagnosis not present

## 2017-07-20 DIAGNOSIS — G35 Multiple sclerosis: Secondary | ICD-10-CM | POA: Diagnosis not present

## 2017-07-20 DIAGNOSIS — C50912 Malignant neoplasm of unspecified site of left female breast: Secondary | ICD-10-CM | POA: Diagnosis not present

## 2017-07-20 DIAGNOSIS — Z170 Estrogen receptor positive status [ER+]: Secondary | ICD-10-CM | POA: Diagnosis not present

## 2017-07-20 DIAGNOSIS — C50911 Malignant neoplasm of unspecified site of right female breast: Secondary | ICD-10-CM | POA: Diagnosis present

## 2017-07-20 DIAGNOSIS — Z87891 Personal history of nicotine dependence: Secondary | ICD-10-CM | POA: Insufficient documentation

## 2017-07-20 DIAGNOSIS — Z79811 Long term (current) use of aromatase inhibitors: Secondary | ICD-10-CM | POA: Insufficient documentation

## 2017-07-20 DIAGNOSIS — Z780 Asymptomatic menopausal state: Secondary | ICD-10-CM | POA: Insufficient documentation

## 2017-07-20 DIAGNOSIS — Z79899 Other long term (current) drug therapy: Secondary | ICD-10-CM | POA: Insufficient documentation

## 2017-07-20 DIAGNOSIS — E1140 Type 2 diabetes mellitus with diabetic neuropathy, unspecified: Secondary | ICD-10-CM | POA: Diagnosis not present

## 2017-07-31 ENCOUNTER — Other Ambulatory Visit: Payer: Self-pay | Admitting: Unknown Physician Specialty

## 2017-08-12 ENCOUNTER — Encounter: Payer: Self-pay | Admitting: Unknown Physician Specialty

## 2017-09-01 ENCOUNTER — Encounter: Payer: BLUE CROSS/BLUE SHIELD | Admitting: Unknown Physician Specialty

## 2017-09-01 ENCOUNTER — Other Ambulatory Visit: Payer: Self-pay

## 2017-09-01 DIAGNOSIS — R000 Tachycardia, unspecified: Secondary | ICD-10-CM

## 2017-09-01 DIAGNOSIS — Z01818 Encounter for other preprocedural examination: Secondary | ICD-10-CM

## 2017-09-01 MED ORDER — FAMOTIDINE 20 MG PO TABS: 20.0000 mg | ORAL_TABLET | Freq: Every day | ORAL | 1 refills | Status: DC

## 2017-09-01 MED ORDER — PRAMIPEXOLE DIHYDROCHLORIDE 0.5 MG PO TABS: 0.5000 mg | ORAL_TABLET | Freq: Every day | ORAL | 1 refills | Status: DC

## 2017-09-01 MED ORDER — TRAZODONE HCL 50 MG PO TABS: 100.0000 mg | ORAL_TABLET | Freq: Every day | ORAL | 0 refills | Status: DC

## 2017-09-01 MED ORDER — CARVEDILOL 3.125 MG PO TABS: 3.1250 mg | ORAL_TABLET | Freq: Two times a day (BID) | ORAL | 1 refills | Status: DC

## 2017-09-01 MED ORDER — FLUTICASONE PROPIONATE 50 MCG/ACT NA SUSP: 2.0000 | Freq: Every day | NASAL | 12 refills | Status: DC

## 2017-09-01 MED ORDER — METFORMIN HCL 500 MG PO TABS: 500.0000 mg | ORAL_TABLET | Freq: Two times a day (BID) | ORAL | 3 refills | Status: DC

## 2017-09-01 NOTE — Telephone Encounter (Signed)
Patient has changed insurance and has started using [facility] as her pharmacy. Needs all medications sent to them. Malachy Mood, please check the directions on the Metformin on the prescription.

## 2017-09-08 ENCOUNTER — Encounter: Payer: BLUE CROSS/BLUE SHIELD | Admitting: Unknown Physician Specialty

## 2017-09-10 ENCOUNTER — Encounter: Payer: BLUE CROSS/BLUE SHIELD | Admitting: Unknown Physician Specialty

## 2017-09-13 ENCOUNTER — Telehealth: Payer: Self-pay | Admitting: *Deleted

## 2017-09-13 ENCOUNTER — Telehealth: Payer: Self-pay | Admitting: Unknown Physician Specialty

## 2017-09-13 MED ORDER — TAMOXIFEN CITRATE 20 MG PO TABS: 20.0000 mg | ORAL_TABLET | Freq: Every day | ORAL | 1 refills | Status: DC

## 2017-09-13 NOTE — Telephone Encounter (Signed)
Called and spoke to the patient. I let her know that I received a fax from [facility] stating that they need a new RX for Accu Check meter and supplies because patient's insurance no longer covers Contour Next. Patient asked for her prescription to be sent to [facility] instead. Patient also asked for me to write for them to please call her with total price before sending supplies. RX form filled out, note written on the bottom per patient request, signed by Malachy Mood, and faxed to [facility].

## 2017-09-13 NOTE — Telephone Encounter (Signed)
Copied from [place] #[id number]. Topic: Inquiry >> Sep 13, 2017 11:08 AM Neva Seat wrote: Reason for CRM:  Pt. Needs advice on how to proceed with Diabetic device and insurance coverage.

## 2017-09-13 NOTE — Telephone Encounter (Signed)
Patient called reporting her insurance changed and she need Tamoxifen prescription sent in to pharmacy. office note states to change in October 2019, Can this be ordered? I placed a return call to patient to see if her pharmacy has changed with the new insurance coverage. Left voice mail to return my call

## 2017-09-13 NOTE — Telephone Encounter (Signed)
Patient called back and stated [facility] is the correct pharmacy, not [facility]  OK to fill per VO Dr Grayland Ormond

## 2017-09-15 ENCOUNTER — Encounter: Payer: BLUE CROSS/BLUE SHIELD | Admitting: Unknown Physician Specialty

## 2017-09-24 ENCOUNTER — Encounter: Payer: Self-pay | Admitting: Unknown Physician Specialty

## 2017-09-24 ENCOUNTER — Ambulatory Visit (INDEPENDENT_AMBULATORY_CARE_PROVIDER_SITE_OTHER): Payer: Medicare Other | Admitting: Unknown Physician Specialty

## 2017-09-24 VITALS — BP 154/95 | HR 111 | Temp 97.9°F | Ht 64.5 in | Wt 205.2 lb

## 2017-09-24 DIAGNOSIS — Z7189 Other specified counseling: Secondary | ICD-10-CM

## 2017-09-24 DIAGNOSIS — Z87891 Personal history of nicotine dependence: Secondary | ICD-10-CM | POA: Diagnosis not present

## 2017-09-24 DIAGNOSIS — I10 Essential (primary) hypertension: Secondary | ICD-10-CM

## 2017-09-24 DIAGNOSIS — Z853 Personal history of malignant neoplasm of breast: Secondary | ICD-10-CM

## 2017-09-24 DIAGNOSIS — Z0000 Encounter for general adult medical examination without abnormal findings: Secondary | ICD-10-CM

## 2017-09-24 HISTORY — DX: Essential (primary) hypertension: I10

## 2017-09-24 MED ORDER — TRAZODONE HCL 50 MG PO TABS: 100.0000 mg | ORAL_TABLET | Freq: Every day | ORAL | 0 refills | Status: DC

## 2017-09-24 NOTE — Assessment & Plan Note (Signed)
Elevated today. Has been OK in past readings.  Recheck in 4 months but keep an eye on it at home.

## 2017-09-24 NOTE — Assessment & Plan Note (Signed)
Has her HCPOA but not sure who that is.  Has living will she will bring in.

## 2017-09-24 NOTE — Assessment & Plan Note (Signed)
Refusing low dose CT

## 2017-09-24 NOTE — Progress Notes (Signed)
BP (!) 154/95 (BP Location: Left Arm, Cuff Size: Large)   Pulse (!) 111   Temp 97.9 F (36.6 C)   Ht 5' 4.5" (1.638 m)   Wt 205 lb 3.2 oz (93.1 kg)   LMP 12/22/1999 (Approximate)   SpO2 98%   BMI 34.68 kg/m    Subjective:    Patient ID: Cassidy Norris, female    DOB: [birth year], 66 y.o.   MRN: [id number]  HPI: Cassidy Norris is a 66 y.o. female  Chief Complaint  Patient presents with  . Medicare Wellness  Functional Status Survey: Is the patient deaf or have difficulty hearing?: No Does the patient have difficulty seeing, even when wearing glasses/contacts?: No Does the patient have difficulty concentrating, remembering, or making decisions?: Yes (sometimes concentrating ) Does the patient have difficulty walking or climbing stairs?: Yes (climbing stairs ) Does the patient have difficulty dressing or bathing?: No Does the patient have difficulty doing errands alone such as visiting a doctor's office or shopping?: No Fall Risk  09/24/2017 01/08/2017 10/29/2016 10/22/2016 10/08/2016  Falls in the past year? No No No No No  Comment - - - none reported since last visit -   Depression screen [facility] 2/9 09/24/2017 09/21/2016 08/28/2016  Decreased Interest 0 0 0  Down, Depressed, Hopeless 0 0 0  PHQ - 2 Score 0 0 0  Altered sleeping 2 - 3  Tired, decreased energy 3 - 3  Change in appetite 3 - 3  Feeling bad or failure about yourself  0 - 0  Trouble concentrating 0 - 0  Moving slowly or fidgety/restless 1 - 0  Suicidal thoughts 0 - 0  PHQ-9 Score 9 - 9    Hypertension Noted elevated BP today   Relevant past medical, surgical, family and social history reviewed and updated as indicated. Interim medical history since our last visit reviewed. Allergies and medications reviewed and updated.  Review of Systems  Per HPI unless specifically indicated above     Objective:    BP (!) 154/95 (BP Location: Left Arm, Cuff Size: Large)   Pulse (!) 111   Temp 97.9 F (36.6 C)    Ht 5' 4.5" (1.638 m)   Wt 205 lb 3.2 oz (93.1 kg)   LMP 12/22/1999 (Approximate)   SpO2 98%   BMI 34.68 kg/m   Wt Readings from Last 3 Encounters:  09/24/17 205 lb 3.2 oz (93.1 kg)  07/20/17 208 lb (94.3 kg)  07/13/17 203 lb 9.6 oz (92.4 kg)    Physical Exam  Constitutional: She is oriented to person, place, and time. She appears well-developed and well-nourished. No distress.  HENT:  Head: Normocephalic and atraumatic.  Eyes: Conjunctivae and lids are normal. Right eye exhibits no discharge. Left eye exhibits no discharge. No scleral icterus.  Neck: Normal range of motion. Neck supple. No JVD present. Carotid bruit is not present.  Cardiovascular: Normal rate, regular rhythm and normal heart sounds.   Pulmonary/Chest: Effort normal and breath sounds normal.  Abdominal: Normal appearance. There is no splenomegaly or hepatomegaly.  Genitourinary: Vagina normal. There is no rash, tenderness, lesion or injury on the right labia. There is no rash, tenderness, lesion or injury on the left labia.  Genitourinary Comments: Pap done  Musculoskeletal: Normal range of motion.  Neurological: She is alert and oriented to person, place, and time.  Skin: Skin is warm, dry and intact. No rash noted. No pallor.  Psychiatric: She has a normal mood and affect. Her behavior  is normal. Judgment and thought content normal.    Results for orders placed or performed in visit on 07/13/17  Bayer DCA Hb A1c Waived  Result Value Ref Range   Bayer DCA Hb A1c Waived 6.5 <7.0 %  TSH  Result Value Ref Range   TSH 1.390 0.450 - 4.500 uIU/mL  Comprehensive metabolic panel  Result Value Ref Range   Glucose 113 (H) 65 - 99 mg/dL   BUN 17 8 - 27 mg/dL   Creatinine, Ser 0.86 0.57 - 1.00 mg/dL   GFR calc non Af Amer 72 >59 mL/min/1.73   GFR calc Af Amer 83 >59 mL/min/1.73   BUN/Creatinine Ratio 20 12 - 28   Sodium 141 134 - 144 mmol/L   Potassium 4.4 3.5 - 5.2 mmol/L   Chloride 102 96 - 106 mmol/L   CO2 22  20 - 29 mmol/L   Calcium 10.2 8.7 - 10.3 mg/dL   Total Protein 7.0 6.0 - 8.5 g/dL   Albumin 4.4 3.6 - 4.8 g/dL   Globulin, Total 2.6 1.5 - 4.5 g/dL   Albumin/Globulin Ratio 1.7 1.2 - 2.2   Bilirubin Total 0.8 0.0 - 1.2 mg/dL   Alkaline Phosphatase 72 39 - 117 IU/L   AST 21 0 - 40 IU/L   ALT 22 0 - 32 IU/L  CBC with Differential/Platelet  Result Value Ref Range   WBC 8.5 3.4 - 10.8 x10E3/uL   RBC 4.95 3.77 - 5.28 x10E6/uL   Hemoglobin 15.5 11.1 - 15.9 g/dL   Hematocrit 46.7 (H) 34.0 - 46.6 %   MCV 94 79 - 97 fL   MCH 31.3 26.6 - 33.0 pg   MCHC 33.2 31.5 - 35.7 g/dL   RDW 13.1 12.3 - 15.4 %   Platelets 211 150 - 379 x10E3/uL   Neutrophils 63 Not Estab. %   Lymphs 25 Not Estab. %   Monocytes 9 Not Estab. %   Eos 3 Not Estab. %   Basos 0 Not Estab. %   Neutrophils Absolute 5.4 1.4 - 7.0 x10E3/uL   Lymphocytes Absolute 2.1 0.7 - 3.1 x10E3/uL   Monocytes Absolute 0.7 0.1 - 0.9 x10E3/uL   EOS (ABSOLUTE) 0.2 0.0 - 0.4 x10E3/uL   Basophils Absolute 0.0 0.0 - 0.2 x10E3/uL   Immature Granulocytes 0 Not Estab. %   Immature Grans (Abs) 0.0 0.0 - 0.1 x10E3/uL      Assessment & Plan:   Problem List Items Addressed This Visit      Unprioritized   Advanced care planning/counseling discussion    Has her HCPOA but not sure who that is.  Has living will she will bring in.        Essential hypertension, benign    Elevated today. Has been OK in past readings.  Recheck in 4 months but keep an eye on it at home.        Former smoker    Refusing low dose CT      History of breast cancer    Other Visit Diagnoses    Initial Medicare annual wellness visit    -  Primary   Relevant Orders   EKG 12-Lead (Completed)       Follow up plan: Return in about 4 months (around 01/22/2018).

## 2017-09-30 LAB — PAP LB, RFX HPV ASCU: PAP Smear Comment: 0

## 2017-09-30 NOTE — Progress Notes (Signed)
Normal labs.  Pt notified through mychart

## 2017-10-11 ENCOUNTER — Encounter: Payer: Self-pay | Admitting: General Surgery

## 2017-10-11 ENCOUNTER — Ambulatory Visit (INDEPENDENT_AMBULATORY_CARE_PROVIDER_SITE_OTHER): Payer: Medicare Other | Admitting: General Surgery

## 2017-10-11 VITALS — BP 124/76 | HR 76 | Resp 14 | Ht 64.5 in | Wt 204.0 lb

## 2017-10-11 DIAGNOSIS — C50411 Malignant neoplasm of upper-outer quadrant of right female breast: Secondary | ICD-10-CM | POA: Diagnosis not present

## 2017-10-11 DIAGNOSIS — C50212 Malignant neoplasm of upper-inner quadrant of left female breast: Secondary | ICD-10-CM | POA: Diagnosis not present

## 2017-10-11 DIAGNOSIS — C50912 Malignant neoplasm of unspecified site of left female breast: Secondary | ICD-10-CM | POA: Diagnosis not present

## 2017-10-11 DIAGNOSIS — Z170 Estrogen receptor positive status [ER+]: Secondary | ICD-10-CM | POA: Diagnosis not present

## 2017-10-11 NOTE — Patient Instructions (Addendum)
Follow up with Dr. Bary Castilla in 1 year.  Will look into breast cancer indexing in 1 yr.

## 2017-10-11 NOTE — Progress Notes (Signed)
Patient ID: Cassidy Norris, female   DOB: 01-Sep-1951, 66 y.o.   MRN: [id number]  Chief Complaint  Patient presents with  . Follow-up    HPI Cassidy Norris is a 66 y.o. female here for follow up for left breast cancer. She reports she feels tight across the chest. She also reports some pain in her right chest. This area also seems to set off security devices.  HPI  Past Medical History:  Diagnosis Date  . Anemia   . Cancer [facility]) 2014   right breast - Invasive Mammary Carcinoma  . Cancer [facility]) 2014   left breast - Invasive lobular carcinoma and DCIS   . Diabetes mellitus without complication ([place])   . Diffuse cystic mastopathy   . Essential hypertension, benign 09/24/2017  . Family history of malignant neoplasm of gastrointestinal tract 2012  . Fractured elbow 08/28/14  . Hx of metabolic acidosis with increased anion gap   . Increased heart rate   . Multiple sclerosis ([place])   . Neuromuscular disorder ([place])    neuropathy in bil feet - left is worse.  . Obesity, unspecified   . Peripheral neuropathy   . Personal history of tobacco use, presenting hazards to health   . Restless leg syndrome   . Sleep apnea   . Special screening for malignant neoplasms, colon     Past Surgical History:  Procedure Laterality Date  . BREAST BIOPSY Right 1973,2001  . BREAST BIOPSY Right 11-07-13   INVASIVE MAMMARY CARCINOMA , ER/PR positive Her2 negative  . BREAST BIOPSY Left 11-27-13   invasive lobular and DCIS  . BREAST SURGERY Bilateral 01/09/14   mastectomy  . CHOLECYSTECTOMY    . COLONOSCOPY  2010   Dr. Jamal Collin  . COLONOSCOPY WITH PROPOFOL N/A 06/11/2015   Procedure: COLONOSCOPY WITH PROPOFOL;  Surgeon: Christene Lye, MD;  Location: [facility] ENDOSCOPY;  Service: Endoscopy;  Laterality: N/A;  . DILATION AND CURETTAGE OF UTERUS  1983  . POLYPECTOMY  1989  . PORTACATH PLACEMENT  2015  . SPINE SURGERY  09/14/14   Ruptured disk L4- L5  . TOTAL KNEE ARTHROPLASTY Left 11/22/2015   Procedure: TOTAL KNEE ARTHROPLASTY;  Surgeon: Earlie Server, MD;  Location: [place];  Service: Orthopedics;  Laterality: Left;  . TUBAL LIGATION    . WISDOM TOOTH EXTRACTION  2006    Family History  Problem Relation Age of Onset  . Cancer Mother        lung  . Cancer Father        colon, stomach  . Diabetes Maternal Grandmother   . Hypertension Sister   . Rectal cancer Paternal Uncle   . Rectal cancer Paternal Uncle     Social History Social History   Tobacco Use  . Smoking status: Former Smoker    Packs/day: 1.00    Years: 30.00    Pack years: 30.00    Types: Cigarettes    Last attempt to quit: 12/11/2013    Years since quitting: 3.8  . Smokeless tobacco: Never Used  Substance Use Topics  . Alcohol use: No    Alcohol/week: 0.0 oz    Comment: social  . Drug use: No    No Known Allergies  Current Outpatient Medications  Medication Sig Dispense Refill  . acetaminophen (TYLENOL) 325 MG tablet Take 650 mg by mouth every 6 (six) hours as needed.    Marland Kitchen BAYER CONTOUR NEXT TEST test strip 1 each by Other route daily.   3  . Calcium  Carbonate (CALCIUM 600 PO) Take 1 tablet by mouth 2 (two) times daily.    . carvedilol (COREG) 3.125 MG tablet Take 1 tablet (3.125 mg total) by mouth 2 (two) times daily with a meal. 180 tablet 1  . Cholecalciferol (VITAMIN D) 2000 UNITS tablet Take 4,000 Units by mouth daily.    . Cyanocobalamin (VITAMIN B-12) 5000 MCG TBDP Take 5,000 mcg by mouth daily.    . diphenhydrAMINE (BENADRYL) 25 MG tablet Take 50 mg by mouth at bedtime as needed.    . famotidine (PEPCID) 20 MG tablet Take 1 tablet (20 mg total) by mouth daily. 90 tablet 1  . fluticasone (FLONASE) 50 MCG/ACT nasal spray Place 2 sprays into both nostrils at bedtime. 16 g 12  . meloxicam (MOBIC) 15 MG tablet Take 15 mg by mouth daily.  2  . metFORMIN (GLUCOPHAGE) 500 MG tablet Take 1 tablet (500 mg total) by mouth 2 (two) times daily with a meal. For first 2 weeks, just take 1 in the AM and  then increase to BID 180 tablet 3  . MICROLET LANCETS MISC   3  . pramipexole (MIRAPEX) 0.5 MG tablet Take 1 tablet (0.5 mg total) by mouth at bedtime. 90 tablet 1  . tamoxifen (NOLVADEX) 20 MG tablet Take 1 tablet daily by mouth.    . traZODone (DESYREL) 50 MG tablet Take 2 tablets (100 mg total) by mouth at bedtime. 180 tablet 0   No current facility-administered medications for this visit.     Review of Systems Review of Systems  Blood pressure 124/76, pulse 76, resp. rate 14, height 5' 4.5" (1.638 m), weight 204 lb (92.5 kg), last menstrual period 12/22/1999.  Physical Exam Physical Exam  Constitutional: She is oriented to person, place, and time. She appears well-developed and well-nourished.  Eyes: Conjunctivae are normal. No scleral icterus.  Neck: Neck supple.  Cardiovascular: Normal rate, regular rhythm and normal heart sounds.  Pulmonary/Chest: Effort normal and breath sounds normal.    Abdominal: There is no splenomegaly or hepatomegaly.  Lymphadenopathy:    She has no cervical adenopathy.    She has no axillary adenopathy.  Neurological: She is alert and oriented to person, place, and time.  Skin: Skin is warm and dry.  Psychiatric: She has a normal mood and affect.    Data Reviewed Previous notes, op note Right breast-invasive mammary carcinoma T1 cN0 M0 Left breast invasive mammary carcinoma T2 N1 M0 Both were hormone positive and HER-2 negative Assessment    Bilateral breast cancer, s/p bilateral mastectomy 2015. Was on aromasin but, switched to tamoxifen. Stable exam.     FH of colon cancer. Colonoscopy completed last yr.  Sees Dr. Grayland Ormond.     Plan    Follow up with Dr. Bary Castilla in 1 year.  Will look into breast cancer indexing in 1 yr.     HPI, Physical Exam, Assessment and Plan have been scribed under the direction and in the presence of Mckinley Jewel, MD  Concepcion Living, LPN I have completed the exam and reviewed the above documentation for  accuracy and completeness.  I agree with the above.  Haematologist has been used and any errors in dictation or transcription are unintentional.  Seeplaputhur G. Jamal Collin, M.D., F.A.C.S.   Junie Panning G 10/13/2017, 10:08 AM

## 2017-10-19 ENCOUNTER — Encounter: Payer: Self-pay | Admitting: Nurse Practitioner

## 2017-10-19 ENCOUNTER — Other Ambulatory Visit: Payer: Self-pay

## 2017-10-19 ENCOUNTER — Ambulatory Visit (INDEPENDENT_AMBULATORY_CARE_PROVIDER_SITE_OTHER): Payer: Medicare Other | Admitting: Nurse Practitioner

## 2017-10-19 VITALS — BP 139/73 | HR 89 | Temp 97.8°F | Ht 64.5 in | Wt 206.4 lb

## 2017-10-19 DIAGNOSIS — F5101 Primary insomnia: Secondary | ICD-10-CM | POA: Diagnosis not present

## 2017-10-19 DIAGNOSIS — Z7689 Persons encountering health services in other specified circumstances: Secondary | ICD-10-CM

## 2017-10-19 DIAGNOSIS — E1165 Type 2 diabetes mellitus with hyperglycemia: Secondary | ICD-10-CM | POA: Diagnosis not present

## 2017-10-19 DIAGNOSIS — Z8639 Personal history of other endocrine, nutritional and metabolic disease: Secondary | ICD-10-CM | POA: Diagnosis not present

## 2017-10-19 NOTE — Progress Notes (Signed)
Subjective:    Patient ID: Cassidy Norris, female    DOB: 1951-04-08, 66 y.o.   MRN: [id number]  Cassidy Norris is a 66 y.o. female presenting on 10/19/2017 for Establish Care (Dr. Julian Hy )   HPI [place] Provider Pt last seen by PCP at [facility] several months ago.  Review records in epic.    Pt expresses dissatisfaction with service received.  Pt is breast cancer survivor s/p bilateral mastectomy and on estrogen supression therapies.  Patient discusses general frustration with her current medical condition feels that her breast cancer has caused many other medical problems.  She states, "I was not on any medication until I had breast cancer.  For now everything seems to be falling apart."  History of Vit. B12 deficiency Pt has a history of vitamin B12 deficiency and is upset she has not been getting her B12 shots monthly.  Last vitamin B12 level was above goal.  Pt states she has been taking vitamin B12 5,000 mg dissolvable tablet daily.  GERD Pt reports heartburn symptoms that are new in last 3 months.  She believes it is w/ change from medication to tamoxifen, but has been assured by Dr. Grayland Ormond that this medication does not cause these symptoms.  Insomnia: - will need refill of trazodone.  It helps some, but pt still struggles with getting more than 5 hours sleep per night.  Diabetes: Patient had a new start of metformin 3 months ago.  Still 138-140 in am fasting.  Afternoon 140-160 2 hours after meals.  Pt is checking CBG 3x per week 2x per day. Patient reports she would like to know how her metformin is helping her A1c. Cassidy Norris is currently asymptomatic and denies polyuria, polyphagia, polydipsia, headaches,, dizziness, lightheadedness, shakiness, chills, sweats. She is not eating low carb diet. She is exercising 4 d per week at gym walking.    Past Medical History:  Diagnosis Date  . Anemia   . B12 deficiency 07/23/2016   Will check levels to  determine if injections are needed again. Await results. Recent CBC at [facility]  . Cancer [facility]) 2014   right breast - Invasive Mammary Carcinoma  . Cancer [facility]) 2014   left breast - Invasive lobular carcinoma and DCIS   . Diabetes mellitus without complication ([place])   . Diffuse cystic mastopathy   . Essential hypertension, benign 09/24/2017  . Family history of malignant neoplasm of gastrointestinal tract 2012  . Fractured elbow 08/28/14  . Hx of metabolic acidosis with increased anion gap   . Increased heart rate   . Multiple sclerosis ([place])   . Neuromuscular disorder ([place])    neuropathy in bil feet - left is worse.  . Obesity, unspecified   . Peripheral neuropathy   . Personal history of tobacco use, presenting hazards to health   . Restless leg syndrome   . Sleep apnea   . Special screening for malignant neoplasms, colon    Past Surgical History:  Procedure Laterality Date  . BREAST BIOPSY Right 1973,2001  . BREAST BIOPSY Right 11-07-13   INVASIVE MAMMARY CARCINOMA , ER/PR positive Her2 negative  . BREAST BIOPSY Left 11-27-13   invasive lobular and DCIS  . BREAST SURGERY Bilateral 01/09/14   mastectomy  . CHOLECYSTECTOMY    . COLONOSCOPY  2010   Dr. Jamal Collin  . COLONOSCOPY WITH PROPOFOL N/A 06/11/2015   Procedure: COLONOSCOPY WITH PROPOFOL;  Surgeon: Christene Lye, MD;  Location: [facility] ENDOSCOPY;  Service:  Endoscopy;  Laterality: N/A;  . DILATION AND CURETTAGE OF UTERUS  1983  . POLYPECTOMY  1989  . PORTACATH PLACEMENT  2015  . SPINE SURGERY  09/14/14   Ruptured disk L4- L5  . TOTAL KNEE ARTHROPLASTY Left 11/22/2015   Procedure: TOTAL KNEE ARTHROPLASTY;  Surgeon: Earlie Server, MD;  Location: [place];  Service: Orthopedics;  Laterality: Left;  . TUBAL LIGATION    . WISDOM TOOTH EXTRACTION  2006   Social History   Socioeconomic History  . Marital status: Widowed    Spouse name: Not on file  . Number of children: Not on file  . Years of education: Not on file  .  Highest education level: Not on file  Social Needs  . Financial resource strain: Not on file  . Food insecurity - worry: Not on file  . Food insecurity - inability: Not on file  . Transportation needs - medical: Not on file  . Transportation needs - non-medical: Not on file  Occupational History  . Not on file  Tobacco Use  . Smoking status: Former Smoker    Packs/day: 1.00    Years: 30.00    Pack years: 30.00    Types: Cigarettes    Last attempt to quit: 12/11/2013    Years since quitting: 3.8  . Smokeless tobacco: Never Used  Substance and Sexual Activity  . Alcohol use: No    Alcohol/week: 0.0 oz    Comment: social  . Drug use: No  . Sexual activity: No  Other Topics Concern  . Not on file  Social History Narrative  . Not on file   Family History  Problem Relation Age of Onset  . Cancer Mother        lung  . Cancer Father        colon, stomach  . Diabetes Maternal Grandmother   . Hypertension Sister   . Rectal cancer Paternal Uncle   . Rectal cancer Paternal Uncle    Current Outpatient Medications on File Prior to Visit  Medication Sig  . BAYER CONTOUR NEXT TEST test strip 1 each by Other route daily.   . Calcium Carbonate (CALCIUM 600 PO) Take 1 tablet by mouth 2 (two) times daily.  . carvedilol (COREG) 3.125 MG tablet Take 1 tablet (3.125 mg total) by mouth 2 (two) times daily with a meal.  . Cholecalciferol (VITAMIN D) 2000 UNITS tablet Take 4,000 Units by mouth daily.  . Cyanocobalamin (VITAMIN B-12) 5000 MCG TBDP Take 5,000 mcg by mouth daily.  . diphenhydrAMINE (BENADRYL) 25 MG tablet Take 50 mg by mouth at bedtime as needed.  . famotidine (PEPCID) 20 MG tablet Take 1 tablet (20 mg total) by mouth daily.  . meloxicam (MOBIC) 15 MG tablet Take 15 mg by mouth daily.  . metFORMIN (GLUCOPHAGE) 500 MG tablet Take 1 tablet (500 mg total) by mouth 2 (two) times daily with a meal. For first 2 weeks, just take 1 in the AM and then increase to BID  . MICROLET LANCETS  MISC   . pramipexole (MIRAPEX) 0.5 MG tablet Take 1 tablet (0.5 mg total) by mouth at bedtime.  . tamoxifen (NOLVADEX) 20 MG tablet Take 1 tablet daily by mouth.  . traZODone (DESYREL) 50 MG tablet Take 2 tablets (100 mg total) by mouth at bedtime.  Marland Kitchen acetaminophen (TYLENOL) 325 MG tablet Take 650 mg by mouth every 6 (six) hours as needed.  . fluticasone (FLONASE) 50 MCG/ACT nasal spray Place 2 sprays into both nostrils  at bedtime.   No current facility-administered medications on file prior to visit.     Review of Systems  Constitutional: Positive for fatigue.  HENT: Negative.   Eyes: Negative.   Respiratory: Negative.   Cardiovascular: Negative.   Gastrointestinal:       Heartburn  Endocrine:       Excessive facial hair, alopecia, weight gain  Genitourinary: Negative.   Musculoskeletal: Negative.   Skin: Negative.   Allergic/Immunologic: Negative.   Neurological: Negative.   Psychiatric/Behavioral: Positive for sleep disturbance. Negative for suicidal ideas.   Per HPI unless specifically indicated above     Objective:    BP 139/73 (BP Location: Left Arm, Patient Position: Sitting)   Pulse 89   Temp 97.8 F (36.6 C) (Oral)   Ht 5' 4.5" (1.638 m)   Wt 206 lb 6.4 oz (93.6 kg)   LMP 12/22/1999 (Approximate)   BMI 34.88 kg/m   Wt Readings from Last 3 Encounters:  10/19/17 206 lb 6.4 oz (93.6 kg)  10/11/17 204 lb (92.5 kg)  09/24/17 205 lb 3.2 oz (93.1 kg)    Physical Exam  General - obese, well-appearing, NAD HEENT - Normocephalic, atraumatic, significant alopecia of top of head from hairline to crown. Pt also w/ excessive facial hair in prominent beard pattern Neck - supple, non-tender, no LAD, no thyromegaly, no carotid bruit Heart - RRR, no murmurs heard Lungs - Clear throughout all lobes, no wheezing, crackles, or rhonchi. Normal work of breathing. Extremeties - non-tender, no edema, cap refill < 2 seconds, peripheral pulses intact +2 bilaterally Skin - warm,  dry Neuro - awake, alert, oriented x3, normal gait Psych - Anxious mood and affect, rapid/pressured speech, tangential thoughts  Results for orders placed or performed in visit on 09/24/17  Pap Lb, rfx HPV ASCU  Result Value Ref Range   DIAGNOSIS: Comment    Specimen adequacy: Comment    Clinician Provided ICD10 Comment    Performed by: Comment    QC reviewed by: Comment    PAP Smear Comment .    Note: Comment    PAP Reflex Comment       Assessment & Plan:   Problem List Items Addressed This Visit      Endocrine   Type 2 diabetes mellitus with hyperglycemia, without long-term current use of insulin ([place])    Currently still above normal blood sugar fasting in the morning.  However has adequate response to glucose utilization after meals.  Unable to repeat A1c today 2/2 less than 3 months from last A1c.  Patient taking metformin and tolerating well.  Plan: 1.  Continue metformin 500 mg once daily. 2.  Discussed A1c goal with patient and relationship to fasting CBG.  Patient verbalizes understanding that cannot have she cannot have A1c rechecked until 2 more months. 3.  Encourage patient to eat low carb diet.  Encouraged continuing exercise 4 times a week at the gym. 4.  Labs last collected by Kathrine Haddock less than 1 month ago.  Will be recollected at future visit. 5.  Follow-up in 3 months      Relevant Orders   Lipid panel   Hemoglobin A1c     Other   Insomnia    Stable condition on trazodone at bedtime.  Patient notes continued insomnia w/less than 5 hours sleep per night even using trazodone 100 mg.  Plan: 1.  Continue trazodone 100 mg daily at bedtime.  Refill provided. 2.  Encouraged adequate sleep hygiene. 3.  Follow-up  3 months      History of non anemic vitamin B12 deficiency - Primary    Stable and at goal without current B12 deficiency.  Patient with inadequate health knowledge about B12 deficiency and B12 dosing.  Currently taking 5000 mg vitamin B12  sublingual daily.  Plan: 1.  Encourage patient to continue oral B12, but may not need 5000 mg each day.  Can likely continue only with multivitamin and get enough B12. 2.  Reviewed with patient B12 toxicity is possible.  Reviewed last lab greater than goal.  Reinforced the patient does not need once monthly B12 injections. 3.  Follow-up with CBC and vitamin B12 once a year.       Other Visit Diagnoses    Encounter to establish care       Patient's previous PCP was a [place] [facility].  Records reviewed in epic.  Medical history reviewed with patient in clinic today.        Follow up plan: Return in about 3 months (around 01/19/2018) for diabetes, GERD, Insomnia.  [name], DNP, AGPCNP-BC Adult Gerontology Primary Care Nurse Practitioner [facility] 10/22/2017, 9:29 PM

## 2017-10-19 NOTE — Patient Instructions (Addendum)
Cassidy Norris, Thank you for coming in to clinic today.  1. Try taking a drug holiday for your sleep medications: - STOP your benadryl for 7-14 days.  Then, resume taking this for 1-2 weeks before trying the drug holiday for your trazodone.  If you don't notice improvement after stopping it and restarting it, then it is not a medicine you need to continue. - in about 4 weeks, do the same process for your trazodone.  2. For your acid reflux: - Continue the famotidine 20 mg once daily.  3. For your meloxicam: - You can try 1/2 tablet if you would like, but you can most definitely continue this at 15 mg once daily. You will be due for [place].  This means you should eat no food or drink after midnight.  Drink only water or coffee without cream/sugar on the morning of your lab visit. - Please go ahead and schedule a "Lab Only" visit in the morning at the clinic for lab draw in the next 7 days. - Your results will be available about 2-3 days after blood draw.  If you have set up a MyChart account, you can can log in to MyChart online to view your results and a brief explanation. Also, we can discuss your results together at your next office visit if you would like.  Please schedule a follow-up appointment with [name], AGNP. Return in about 3 months (around 01/19/2018) for diabetes, GERD, Insomnia.  If you have any other questions or concerns, please feel free to call the clinic or send a message through [place]. You may also schedule an earlier appointment if necessary.  You will receive a survey after today's visit either digitally by e-mail or paper by [employer]. Your experiences and feedback matter to [place].  Please respond so we know how we are doing as we provide care for you.   [name], DNP, AGNP-BC Adult Gerontology Nurse Practitioner [place]

## 2017-10-20 ENCOUNTER — Other Ambulatory Visit: Payer: Medicare Other

## 2017-10-20 ENCOUNTER — Telehealth: Payer: Self-pay | Admitting: Nurse Practitioner

## 2017-10-20 NOTE — Telephone Encounter (Signed)
Pt scheduled a lab appt for today but called this morning just to clarify if it's fasting or just A1C [phone]

## 2017-10-22 ENCOUNTER — Other Ambulatory Visit: Payer: Medicare Other

## 2017-10-22 ENCOUNTER — Encounter: Payer: Self-pay | Admitting: Nurse Practitioner

## 2017-10-22 DIAGNOSIS — E1165 Type 2 diabetes mellitus with hyperglycemia: Secondary | ICD-10-CM | POA: Diagnosis not present

## 2017-10-22 DIAGNOSIS — Z8639 Personal history of other endocrine, nutritional and metabolic disease: Secondary | ICD-10-CM | POA: Insufficient documentation

## 2017-10-22 NOTE — Assessment & Plan Note (Addendum)
Currently still above normal blood sugar fasting in the morning.  However has adequate response to glucose utilization after meals.  Unable to repeat A1c today 2/2 less than 3 months from last A1c.  Patient taking metformin and tolerating well.  Plan: 1.  Continue metformin 500 mg once daily. 2.  Discussed A1c goal with patient and relationship to fasting CBG.  Patient verbalizes understanding that cannot have she cannot have A1c rechecked until 2 more months. 3.  Encourage patient to eat low carb diet.  Encouraged continuing exercise 4 times a week at the gym. 4.  Labs last collected by Kathrine Haddock less than 1 month ago.  Will be recollected at future visit. 5.  Follow-up in 3 months

## 2017-10-22 NOTE — Assessment & Plan Note (Signed)
Stable condition on trazodone at bedtime.  Patient notes continued insomnia w/less than 5 hours sleep per night even using trazodone 100 mg.  Plan: 1.  Continue trazodone 100 mg daily at bedtime.  Refill provided. 2.  Encouraged adequate sleep hygiene. 3.  Follow-up 3 months

## 2017-10-22 NOTE — Assessment & Plan Note (Signed)
Stable and at goal without current B12 deficiency.  Patient with inadequate health knowledge about B12 deficiency and B12 dosing.  Currently taking 5000 mg vitamin B12 sublingual daily.  Plan: 1.  Encourage patient to continue oral B12, but may not need 5000 mg each day.  Can likely continue only with multivitamin and get enough B12. 2.  Reviewed with patient B12 toxicity is possible.  Reviewed last lab greater than goal.  Reinforced the patient does not need once monthly B12 injections. 3.  Follow-up with CBC and vitamin B12 once a year.

## 2017-10-23 LAB — HEMOGLOBIN A1C
Hgb A1c MFr Bld: 6.2 % of total Hgb — ABNORMAL HIGH (ref <5.7)
Mean Plasma Glucose: 131 (calc)
eAG (mmol/L): 7.3 (calc)

## 2017-10-23 LAB — LIPID PANEL
Cholesterol: 149 mg/dL (ref <200)
HDL: 34 mg/dL — ABNORMAL LOW (ref >50)
LDL Cholesterol (Calc): 85 mg/dL (calc)
Non-HDL Cholesterol (Calc): 115 mg/dL (calc) (ref <130)
Total CHOL/HDL Ratio: 4.4 (calc) (ref <5.0)
Triglycerides: 204 mg/dL — ABNORMAL HIGH (ref <150)

## 2017-10-26 ENCOUNTER — Other Ambulatory Visit: Payer: Self-pay | Admitting: Nurse Practitioner

## 2017-10-26 DIAGNOSIS — E785 Hyperlipidemia, unspecified: Principal | ICD-10-CM

## 2017-10-26 DIAGNOSIS — E1169 Type 2 diabetes mellitus with other specified complication: Secondary | ICD-10-CM

## 2017-10-26 MED ORDER — OMEGA-3-ACID ETHYL ESTERS 1 G PO CAPS: 1.0000 g | ORAL_CAPSULE | Freq: Two times a day (BID) | ORAL | 5 refills | Status: DC

## 2017-11-04 ENCOUNTER — Encounter: Payer: Self-pay | Admitting: Nurse Practitioner

## 2017-11-11 DIAGNOSIS — Z96652 Presence of left artificial knee joint: Secondary | ICD-10-CM | POA: Diagnosis not present

## 2017-11-25 DIAGNOSIS — J301 Allergic rhinitis due to pollen: Secondary | ICD-10-CM | POA: Diagnosis not present

## 2017-11-25 DIAGNOSIS — G4733 Obstructive sleep apnea (adult) (pediatric): Secondary | ICD-10-CM | POA: Diagnosis not present

## 2017-11-25 DIAGNOSIS — J340 Abscess, furuncle and carbuncle of nose: Secondary | ICD-10-CM | POA: Diagnosis not present

## 2018-01-03 ENCOUNTER — Ambulatory Visit: Payer: Medicare Other | Admitting: Unknown Physician Specialty

## 2018-01-04 ENCOUNTER — Other Ambulatory Visit: Payer: Self-pay | Admitting: Unknown Physician Specialty

## 2018-01-05 ENCOUNTER — Ambulatory Visit: Payer: Medicare Other | Admitting: Unknown Physician Specialty

## 2018-01-10 ENCOUNTER — Other Ambulatory Visit: Payer: Self-pay

## 2018-01-10 ENCOUNTER — Other Ambulatory Visit: Payer: Medicare Other

## 2018-01-10 ENCOUNTER — Ambulatory Visit (INDEPENDENT_AMBULATORY_CARE_PROVIDER_SITE_OTHER): Payer: Medicare Other | Admitting: Neurology

## 2018-01-10 ENCOUNTER — Encounter: Payer: Self-pay | Admitting: Neurology

## 2018-01-10 VITALS — BP 138/80 | HR 82 | Ht 65.5 in | Wt 201.0 lb

## 2018-01-10 DIAGNOSIS — G35 Multiple sclerosis: Secondary | ICD-10-CM | POA: Diagnosis not present

## 2018-01-10 DIAGNOSIS — I10 Essential (primary) hypertension: Secondary | ICD-10-CM | POA: Diagnosis not present

## 2018-01-10 DIAGNOSIS — E785 Hyperlipidemia, unspecified: Secondary | ICD-10-CM | POA: Diagnosis not present

## 2018-01-10 DIAGNOSIS — E1169 Type 2 diabetes mellitus with other specified complication: Secondary | ICD-10-CM | POA: Diagnosis not present

## 2018-01-10 NOTE — Progress Notes (Signed)
NEUROLOGY FOLLOW UP OFFICE NOTE  Cassidy Norris [id number]  HISTORY OF PRESENT ILLNESS: Cassidy Norris is a 67 year old right-handed woman with multiple sclerosis, former smoker, pre-diabetes, status post bilateral breast cancer 02/28/2014 status post bilateral mastectomy and status post L4-5 laminectomy who follows up for multiple sclerosis.  She is accompanied by her sister who provides some history.     UPDATE: Repeat MRI of brain with and without contrast from 01/28/17 was personally reviewed and demonstrated "two small new or increased right hemisphere subcortical white matter lesions when compared to 01-Mar-2015" with no enhancement or active demyelination.  Otherwise, MRI is stable.  At the time, she deferred starting disease modifying therapy.  She takes D3 4000 IU daily.  Vision:  No changes. Motor:  Lately, notes that her right ankle may give out Sensory:  No change to neuropathy in feet, worse on the left Pain:  Pain in shins and knees (status post left knee replacement and with arthritis in right knee) Gait:  Chronic unsteady gait. Bowel/bladder:  Some constipation. Fatigue:  No  Cognition:  No change.   Mood:  Okay   HISTORY: Her husband passed away due to [place] in 03-01-1999.  In November 2001, she tripped and fell, hitting her head.  She went to the ED where a CT of the head was reportedly unremarkable.  She was advised to have an MRI of the brain, which was performed on 12/12/00.  It revealed small round and elliptical white matter lesions less than 1 cm in the periventricular region.  In December of 2002, she followed up with a neurologist, Dr. Corinna Capra.  VEP performed in 02/28/02 showed prolonged p100 latency in the right optic nerve.  BAER was normal.  She reportedly had a lumbar puncture where CSF results were supportive of MS.  She was given 5 days of Solu-Medrol followed by 3 weeks of oral prednisone.  She was advised to start an interferon, but due to possible side effects, she decided to hold  off and see if she clinically progresses.  She did have a follow up MRI of the brain in December 2004, which reportedly showed an enhancing lesion as well as a remote lesion in the cerebellum in addition to the periventricular white matter lesions.  She never had any noticeable clinical flare-ups and had done well off of a disease-modifying agent.  Over the past 3 years, she notes possible worsening in depth perception.  Sometimes she notes difficulty grabbing for objects or will drive too close to the curb.   She had an MRI of the brain and cervical spine with and without contrast on 03/12/15, which showed non-enhancing bilateral periventricular white matter lesions oriented perpendicular to the ventricles and corpus callosum, as well as involving temporal lobe white matter and mild involvement of the left side of cerebellum.  No cervical cord lesions were seen.     In February 28, 2014, she was diagnosed with bilateral breast cancer.  She underwent bilateral mastectomy and chemotherapy, which ended in September.  As a result of the chemotherapy, she has neuropathy in the feet.  She also had problems with her left leg and was found to have a ruptured disc at L4-5 and underwent surgery in October 2015.  She has baseline numbness over the dorsum of left foot and lateral leg.  The toes in her left foot sometimes curl.  Since chemotherapy, she notes some mild memory problems.  She sometimes misplaces objects or will forget if she locked the front  door when she left.  She will sometimes have difficulty getting a word out.  PAST MEDICAL HISTORY: Past Medical History:  Diagnosis Date  . Anemia   . B12 deficiency 07/23/2016   Will check levels to determine if injections are needed again. Await results. Recent CBC at Onc WNL  . Cancer [facility]) 2014   right breast - Invasive Mammary Carcinoma  . Cancer [facility]) 2014   left breast - Invasive lobular carcinoma and DCIS   . Diabetes mellitus without complication ([place])   . Diffuse  cystic mastopathy   . Essential hypertension, benign 09/24/2017  . Family history of malignant neoplasm of gastrointestinal tract 2012  . Fractured elbow 08/28/14  . Hx of metabolic acidosis with increased anion gap   . Increased heart rate   . Multiple sclerosis ([place])   . Neuromuscular disorder ([place])    neuropathy in bil feet - left is worse.  . Obesity, unspecified   . Peripheral neuropathy   . Personal history of tobacco use, presenting hazards to health   . Restless leg syndrome   . Sleep apnea   . Special screening for malignant neoplasms, colon     MEDICATIONS: Current Outpatient Medications on File Prior to Visit  Medication Sig Dispense Refill  . acetaminophen (TYLENOL) 325 MG tablet Take 650 mg by mouth every 6 (six) hours as needed.    Marland Kitchen BAYER CONTOUR NEXT TEST test strip 1 each by Other route daily.   3  . Calcium Carbonate (CALCIUM 600 PO) Take 1 tablet by mouth 2 (two) times daily.    . carvedilol (COREG) 3.125 MG tablet Take 1 tablet (3.125 mg total) by mouth 2 (two) times daily with a meal. 180 tablet 1  . Cholecalciferol (VITAMIN D) 2000 UNITS tablet Take 4,000 Units by mouth daily.    . Cyanocobalamin (VITAMIN B-12) 5000 MCG TBDP Take 5,000 mcg by mouth daily.    . famotidine (PEPCID) 20 MG tablet Take 1 tablet (20 mg total) by mouth daily. 90 tablet 1  . fluticasone (FLONASE) 50 MCG/ACT nasal spray Place 2 sprays into both nostrils at bedtime. 16 g 12  . meloxicam (MOBIC) 15 MG tablet Take 15 mg by mouth daily.  2  . metFORMIN (GLUCOPHAGE) 500 MG tablet Take 1 tablet (500 mg total) by mouth 2 (two) times daily with a meal. For first 2 weeks, just take 1 in the AM and then increase to BID 180 tablet 3  . MICROLET LANCETS MISC   3  . omega-3 acid ethyl esters (LOVAZA) 1 g capsule Take 1 capsule (1 g total) by mouth 2 (two) times daily. 60 capsule 5  . pramipexole (MIRAPEX) 0.5 MG tablet Take 1 tablet (0.5 mg total) by mouth at bedtime. 90 tablet 1  . tamoxifen  (NOLVADEX) 20 MG tablet Take 1 tablet daily by mouth.    . traZODone (DESYREL) 50 MG tablet TAKE 2 TABLETS (100 MG TOTAL) BY MOUTH AT BEDTIME. 180 tablet 0   No current facility-administered medications on file prior to visit.     ALLERGIES: No Known Allergies  FAMILY HISTORY: Family History  Problem Relation Age of Onset  . Cancer Mother        lung  . Cancer Father        colon, stomach  . Diabetes Maternal Grandmother   . Hypertension Sister   . Rectal cancer Paternal Uncle   . Rectal cancer Paternal Uncle     SOCIAL HISTORY: Social History   Socioeconomic  History  . Marital status: Widowed    Spouse name: Not on file  . Number of children: Not on file  . Years of education: Not on file  . Highest education level: Not on file  Social Needs  . Financial resource strain: Not on file  . Food insecurity - worry: Not on file  . Food insecurity - inability: Not on file  . Transportation needs - medical: Not on file  . Transportation needs - non-medical: Not on file  Occupational History  . Not on file  Tobacco Use  . Smoking status: Former Smoker    Packs/day: 1.00    Years: 30.00    Pack years: 30.00    Types: Cigarettes    Last attempt to quit: 12/11/2013    Years since quitting: 4.0  . Smokeless tobacco: Never Used  Substance and Sexual Activity  . Alcohol use: No    Alcohol/week: 0.0 oz    Comment: social  . Drug use: No  . Sexual activity: No  Other Topics Concern  . Not on file  Social History Narrative  . Not on file    REVIEW OF SYSTEMS: Constitutional: No fevers, chills, or sweats, no generalized fatigue, change in appetite Eyes: No visual changes, double vision, eye pain Ear, nose and throat: No hearing loss, ear pain, nasal congestion, sore throat Cardiovascular: No chest pain, palpitations Respiratory:  No shortness of breath at rest or with exertion, wheezes GastrointestinaI: No nausea, vomiting, diarrhea, abdominal pain, fecal  incontinence Genitourinary:  No dysuria, urinary retention or frequency Musculoskeletal:  No neck pain, back pain Integumentary: No rash, pruritus, skin lesions Neurological: as above Psychiatric: No depression, insomnia, anxiety Endocrine: No palpitations, fatigue, diaphoresis, mood swings, change in appetite, change in weight, increased thirst Hematologic/Lymphatic:  No purpura, petechiae. Allergic/Immunologic: no itchy/runny eyes, nasal congestion, recent allergic reactions, rashes  PHYSICAL EXAM: Vitals:   01/10/18 1046  BP: 138/80  Pulse: 82  SpO2: 99%   General: No acute distress.  Patient appears well-groomed.  Head:  Normocephalic/atraumatic Eyes:  Fundi examined but not visualized Neck: supple, no paraspinal tenderness, full range of motion Heart:  Regular rate and rhythm Lungs:  Clear to auscultation bilaterally Back: No paraspinal tenderness Neurological Exam: alert and oriented to person, place, and time. Attention span and concentration intact, recent and remote memory intact, fund of knowledge intact.  Speech fluent and not dysarthric, language intact.  CN II-XII intact. Fundoscopic exam unremarkable without vessel changes, exudates, hemorrhages or papilledema.  Bulk and tone normal, muscle strength 5/5 throughout.  Sensation to pinprick decreased in left foot, vibration reduced in both feet.  Deep tendon reflexes 2+ throughout, toes downgoing.  Finger to nose and heel to shin testing intact.  Gait mildly wide-based with good stride. Timed 25-foot walk 4.31 seconds.  Romberg negative.  IMPRESSION: Multiple sclerosis.  Incidentally diagnosed.  No significant clinical history.  However, MRI findings characteristic for demyelination and CSF analysis supportive.  Considering some progression (although mild) of disease on MRI, I recommend starting disease modifying therapy (especially since she continues to be high functioning).  She is agreeable.  PLAN: 1.  We will start  Aubagio  Check CBC with diff, hepatic function panel, TB.  Also check vitamin D level.  After starting Aubagio, repeat hepatic function panel once a month for 6 months.   2.  Repeat CBC with diff in 6 months.   3.  Repeat MRI of brain with and without contrast to establish new baseline. 4.  Continue D3 4000 IU daily  5.  Follow up in 6 months.  25 minutes spent face to face with patient, over 50% spent discussing management and disease modifying therapy.  Metta Clines, DO  CC: [name], NP

## 2018-01-10 NOTE — Addendum Note (Signed)
Addended by: Clois Comber on: 01/10/2018 01:48 PM   Modules accepted: Orders

## 2018-01-10 NOTE — Patient Instructions (Addendum)
1.  We will start Aubagio  Check CBC with diff, hepatic function panel, TB.  Also check vitamin D level.  After starting Aubagio, repeat hepatic function panel once a month for 6 months.   2.  Repeat CBC with diff in 6 months.   3.  Repeat MRI of brain with and without contrast  4.  Continue D3 4000 IU daily  5.  Follow up in 6 months.

## 2018-01-12 LAB — QUANTIFERON-TB GOLD PLUS
NIL: 0.02 IU/mL
QuantiFERON-TB Gold Plus: NEGATIVE
TB1-NIL: 0.01 [IU]/mL
TB2-NIL: 0.01 [IU]/mL

## 2018-01-13 ENCOUNTER — Telehealth: Payer: Self-pay | Admitting: Neurology

## 2018-01-13 ENCOUNTER — Telehealth: Payer: Self-pay

## 2018-01-13 DIAGNOSIS — G35 Multiple sclerosis: Secondary | ICD-10-CM

## 2018-01-13 LAB — HEPATIC FUNCTION PANEL
AG RATIO: 1.7 (calc) (ref 1.0–2.5)
ALBUMIN MSPROF: 4.4 g/dL (ref 3.6–5.1)
ALT: 21 U/L (ref 6–29)
AST: 22 U/L (ref 10–35)
Alkaline phosphatase (APISO): 57 U/L (ref 33–130)
BILIRUBIN TOTAL: 0.7 mg/dL (ref 0.2–1.2)
Bilirubin, Direct: 0.1 mg/dL (ref 0.0–0.2)
Globulin: 2.6 g/dL (calc) (ref 1.9–3.7)
Indirect Bilirubin: 0.6 mg/dL (calc) (ref 0.2–1.2)
Total Protein: 7 g/dL (ref 6.1–8.1)

## 2018-01-13 LAB — CBC WITH DIFFERENTIAL/PLATELET
BASOS ABS: 49 {cells}/uL (ref 0–200)
Basophils Relative: 0.6 %
EOS ABS: 180 {cells}/uL (ref 15–500)
Eosinophils Relative: 2.2 %
HEMATOCRIT: 40.1 % (ref 35.0–45.0)
HEMOGLOBIN: 13.9 g/dL (ref 11.7–15.5)
LYMPHS ABS: 2189 {cells}/uL (ref 850–3900)
MCH: 32 pg (ref 27.0–33.0)
MCHC: 34.7 g/dL (ref 32.0–36.0)
MCV: 92.2 fL (ref 80.0–100.0)
MPV: 11 fL (ref 7.5–12.5)
Monocytes Relative: 8.6 %
NEUTROS ABS: 5076 {cells}/uL (ref 1500–7800)
Neutrophils Relative %: 61.9 %
Platelets: 192 10*3/uL (ref 140–400)
RBC: 4.35 10*6/uL (ref 3.80–5.10)
RDW: 12.1 % (ref 11.0–15.0)
Total Lymphocyte: 26.7 %
WBC mixed population: 705 cells/uL (ref 200–950)
WBC: 8.2 10*3/uL (ref 3.8–10.8)

## 2018-01-13 LAB — VITAMIN D 1,25 DIHYDROXY
Vitamin D 1, 25 (OH)2 Total: 33 pg/mL (ref 18–72)
Vitamin D2 1, 25 (OH)2: <8 pg/mL
Vitamin D3 1, 25 (OH)2: 33 pg/mL

## 2018-01-13 NOTE — Telephone Encounter (Signed)
-----   Message from Pieter Partridge, DO sent at 01/13/2018  4:12 PM EST ----- Ms. Birkland may proceed with starting Aubagio

## 2018-01-13 NOTE — Telephone Encounter (Signed)
Received prior authorization request from [place] one to one for Aubagio and submitted via covermymeds.

## 2018-01-13 NOTE — Telephone Encounter (Signed)
Called and spoke with Pt, she is waiting on medication to arrive. She will call me with date she starts Aubagio. She is aware she will need tests once a month for 6 months.

## 2018-01-14 ENCOUNTER — Telehealth: Payer: Self-pay

## 2018-01-14 ENCOUNTER — Telehealth: Payer: Self-pay | Admitting: Neurology

## 2018-01-14 ENCOUNTER — Other Ambulatory Visit: Payer: Self-pay

## 2018-01-14 DIAGNOSIS — E559 Vitamin D deficiency, unspecified: Secondary | ICD-10-CM

## 2018-01-14 DIAGNOSIS — G35 Multiple sclerosis: Secondary | ICD-10-CM

## 2018-01-14 MED ORDER — DIMETHYL FUMARATE 120 & 240 MG PO MISC: 1.0000 | ORAL | 3 refills | Status: DC

## 2018-01-14 NOTE — Telephone Encounter (Signed)
Called regarding a Prior auth on a medication. The #[id number]. Thanks

## 2018-01-14 NOTE — Telephone Encounter (Signed)
Rcvd fax from [place], [place] denied. Dr Tomi Likens changed to Tecfidera. Sending in new Rx. Called to advise Pt, LM on VM

## 2018-01-14 NOTE — Telephone Encounter (Signed)
-----   Message from Pieter Partridge, DO sent at 01/14/2018  7:06 AM EST ----- Her vitamin D level is in normal range of 33, however I would like the level higher.  I would like her to increase D3 from 4000 IU daily to 6000 IU daily.  Repeat level in 6 months (prior to follow up with other 6 month labs)

## 2018-01-14 NOTE — Addendum Note (Signed)
Addended by: Clois Comber on: 01/14/2018 12:19 PM   Modules accepted: Orders

## 2018-01-14 NOTE — Telephone Encounter (Signed)
Called and spoke wirth Pt, advsd of labs and to increase Vit D. Added labs to 6 mo lab appt

## 2018-01-16 NOTE — Progress Notes (Signed)
Cassidy Norris  [phone]  [phone]     Cassidy Norris DOB: [birth year]  MR#: [id number]  TWS#:[id number]  Patient Care Team: Mikey College, NP as PCP - General (Nurse Practitioner) Christene Lye, MD (General Surgery) Forest Gleason, MD (Oncology) Pieter Partridge, DO as Consulting Physician (Neurology)  CHIEF COMPLAINT: Bilateral adenocarcinoma of the breast.  INTERVAL HISTORY:   Patient returns to clinic today for routine six-month follow-up. She switched back to tamoxifen in October 2019 secondary to cost and is tolerating this well. She has generalized myalgias and pain secondary to her underlying multiple sclerosis, but patient reports she has not started treatment secondary to cost.  She otherwise feels well and is asymptomatic. She has no neurologic complaints. She denies any recent fevers or illnesses. She has a good appetite and denies weight loss. She has no chest pain or shortness of breath. She denies any nausea, vomiting, constipation, or diarrhea. She has no urinary complaints. Patient offers no further specific complaints today.   REVIEW OF SYSTEMS:   Review of Systems  Constitutional: Negative for fever, malaise/fatigue and weight loss.  Respiratory: Negative.  Negative for cough and shortness of breath.   Cardiovascular: Negative.  Negative for chest pain and leg swelling.  Gastrointestinal: Negative.  Negative for abdominal pain.  Genitourinary: Negative.   Musculoskeletal: Positive for myalgias.  Skin: Negative.  Negative for rash.  Neurological: Negative.  Negative for sensory change and weakness.  Psychiatric/Behavioral: Negative.  The patient is not nervous/anxious.     As per HPI. Otherwise, a complete review of systems is negative.  ONCOLOGY HISTORY: Oncology History   1. Bilateral carcinoma of breast, February, 2015. Right breast status post radical mastectomy. T1 C. N0M0 (isolated tumor cells in one lymph  node) multifocal invasive cancer. Stage IC, Left breast, Status post radical mastectomy, T2, N1, M0 tumor. Stage II, RIght breast. Both tumors are estrogen receptor positive.  Progesterone receptor positive.  HER-2 receptor negative by FISH. Negative for BRCA mutation.  2. Started on Adriamycin and Cytoxan on March 01, 2014. Last cycle of Cytoxan and Adriamycin on May 01, 2014. Patient has finished last chemotherapy on September 9. (12 th dose was omitted because of neuropathy) 3.  Taking tamoxifen.  Patient had a poor tolerance to letrozole.(October, 2015) 4.  Patient is taking letrozole since November of 2015 5.  May, 2016 Letrozole has been put on hold because of bony pains 6.  Started on Aromasin from July of 2016      Malignant neoplasm of breast ([place])   11/29/2013 Initial Diagnosis    Breast cancer bilateral, multifocal ER/PR pos, Her 2 neg,.       PAST MEDICAL HISTORY: Past Medical History:  Diagnosis Date  . Anemia   . B12 deficiency 07/23/2016   Will check levels to determine if injections are needed again. Await results. Recent CBC at Onc WNL  . Cancer [facility]) 2014   right breast - Invasive Mammary Carcinoma  . Cancer [facility]) 2014   left breast - Invasive lobular carcinoma and DCIS   . Diabetes mellitus without complication ([place])   . Diffuse cystic mastopathy   . Essential hypertension, benign 09/24/2017  . Family history of malignant neoplasm of gastrointestinal tract 2012  . Fractured elbow 08/28/14  . Hx of metabolic acidosis with increased anion gap   . Increased heart rate   . Multiple sclerosis ([place])   . Neuromuscular disorder (HCC)    neuropathy in bil feet - left  is worse.  . Obesity, unspecified   . Peripheral neuropathy   . Personal history of tobacco use, presenting hazards to health   . Restless leg syndrome   . Sleep apnea   . Special screening for malignant neoplasms, colon     PAST SURGICAL HISTORY: Past Surgical History:  Procedure Laterality Date    . BREAST BIOPSY Right 1973,2001  . BREAST BIOPSY Right 11-07-13   INVASIVE MAMMARY CARCINOMA , ER/PR positive Her2 negative  . BREAST BIOPSY Left 11-27-13   invasive lobular and DCIS  . BREAST SURGERY Bilateral 01/09/14   mastectomy  . CHOLECYSTECTOMY    . COLONOSCOPY  2010   Dr. Jamal Collin  . COLONOSCOPY WITH PROPOFOL N/A 06/11/2015   Procedure: COLONOSCOPY WITH PROPOFOL;  Surgeon: Christene Lye, MD;  Location: [facility] ENDOSCOPY;  Service: Endoscopy;  Laterality: N/A;  . DILATION AND CURETTAGE OF UTERUS  1983  . POLYPECTOMY  1989  . PORTACATH PLACEMENT  2015  . SPINE SURGERY  09/14/14   Ruptured disk L4- L5  . TOTAL KNEE ARTHROPLASTY Left 11/22/2015   Procedure: TOTAL KNEE ARTHROPLASTY;  Surgeon: Earlie Server, MD;  Location: [facility];  Service: Orthopedics;  Laterality: Left;  . TUBAL LIGATION    . WISDOM TOOTH EXTRACTION  2006    FAMILY HISTORY Family History  Problem Relation Age of Onset  . Cancer Mother        lung  . Cancer Father        colon, stomach  . Diabetes Maternal Grandmother   . Hypertension Sister   . Rectal cancer Paternal Uncle   . Rectal cancer Paternal Uncle     GYNECOLOGIC HISTORY:  Patient's last menstrual period was 12/22/1999 (approximate).     ADVANCED DIRECTIVES:    HEALTH MAINTENANCE: Social History   Tobacco Use  . Smoking status: Former Smoker    Packs/day: 1.00    Years: 30.00    Pack years: 30.00    Types: Cigarettes    Last attempt to quit: 12/11/2013    Years since quitting: 4.1  . Smokeless tobacco: Never Used  Substance Use Topics  . Alcohol use: No    Alcohol/week: 0.0 oz    Comment: social  . Drug use: No     Colonoscopy:  PAP:  Bone density:  Mammogram:  No Known Allergies  Current Outpatient Medications  Medication Sig Dispense Refill  . acetaminophen (TYLENOL) 325 MG tablet Take 650 mg by mouth every 6 (six) hours as needed.    Marland Kitchen BAYER CONTOUR NEXT TEST test strip 1 each by Other route daily.   3  . Calcium  Carbonate (CALCIUM 600 PO) Take 1 tablet by mouth 2 (two) times daily.    . Cholecalciferol (VITAMIN D) 2000 UNITS tablet Take 4,000 Units by mouth daily.    . Cyanocobalamin (VITAMIN B-12) 5000 MCG TBDP Take 5,000 mcg by mouth daily.    . Dimethyl Fumarate (TECFIDERA) 120 & 240 MG MISC Take 1 capsule by mouth as directed. One 120 mg cap BID for 7 days, then one 240 mg cap BID thereafter 60 each 3  . MICROLET LANCETS MISC   3  . omega-3 acid ethyl esters (LOVAZA) 1 g capsule Take 1 capsule (1 g total) by mouth 2 (two) times daily. 60 capsule 5  . tamoxifen (NOLVADEX) 20 MG tablet Take 1 tablet daily by mouth.    . carvedilol (COREG) 3.125 MG tablet Take 1 tablet (3.125 mg total) by mouth 2 (two) times daily with  a meal. 180 tablet 1  . docusate sodium (COLACE) 250 MG capsule Take 1 capsule (250 mg total) by mouth daily. 30 capsule 0  . famotidine (PEPCID) 20 MG tablet Take 1 tablet (20 mg total) by mouth daily. 90 tablet 1  . fluticasone (FLONASE) 50 MCG/ACT nasal spray Place 2 sprays into both nostrils at bedtime. 16 g 12  . lisinopril (PRINIVIL,ZESTRIL) 5 MG tablet Take 1 tablet (5 mg total) by mouth daily. 90 tablet 3  . meloxicam (MOBIC) 7.5 MG tablet Take 1 tablet (7.5 mg total) by mouth daily. 90 tablet 1  . metFORMIN (GLUCOPHAGE) 500 MG tablet Take 1 tablet (500 mg total) by mouth 2 (two) times daily with a meal. For first 2 weeks, just take 1 in the AM and then increase to BID 180 tablet 3  . polyethylene glycol powder (GLYCOLAX/MIRALAX) powder Take 17 g by mouth 2 (two) times daily as needed for moderate constipation. 3350 g 1  . traZODone (DESYREL) 50 MG tablet Take 2 tablets (100 mg total) by mouth at bedtime. 180 tablet 0   No current facility-administered medications for this visit.     OBJECTIVE: BP 127/82 (BP Location: Left Arm, Patient Position: Sitting)   Pulse 82   Temp 98.2 F (36.8 C) (Tympanic)   Resp 12   Ht 5' 5.5" (1.664 m)   Wt 200 lb 6.4 oz (90.9 kg)   LMP  12/22/1999 (Approximate)   BMI 32.84 kg/m    Body mass index is 32.84 kg/m.    ECOG FS:0 - Asymptomatic  General: Well-developed, well-nourished, no acute distress. Eyes: Pink conjunctiva, anicteric sclera. Breasts: Patient has bilateral mastectomies. Lungs: Clear to auscultation bilaterally. Heart: Regular rate and rhythm. No rubs, murmurs, or gallops. Abdomen: Soft, nontender, nondistended. No organomegaly noted, normoactive bowel sounds. Musculoskeletal: No edema, cyanosis, or clubbing. Neuro: Alert, answering all questions appropriately. Cranial nerves grossly intact. Skin: No rashes or petechiae noted. Psych: Normal affect.   LAB RESULTS:  No visits with results within 3 Day(s) from this visit.  Latest known visit with results is:  Lab on 01/10/2018  Component Date Value Ref Range Status  . Vitamin D 1, 25 (OH)2 Total 01/10/2018 33  18 - 72 pg/mL Final  . Vitamin D3 1, 25 (OH)2 01/10/2018 33  pg/mL Final  . Vitamin D2 1, 25 (OH)2 01/10/2018 <8  pg/mL Final   Comment: Marland Kitchen Vitamin D3, 1,25(OH) indicates both endogenous production and supplementation. Vitamin D2, 1,25(OH)2 is an indicator of exogenous sources, such as diet or supplementation.  Interpretation and therapy are based on measurement of Vitamin D,1,25(OH)2, Total. . . This test was developed and its analytical performance characteristics have been determined by [facility], [place], [place]. It has not been cleared or approved by the FDA. This assay has been validated pursuant to the CLIA regulations and is used for clinical purposes. .   . Total Protein 01/10/2018 7.0  6.1 - 8.1 g/dL Final  . Albumin 01/10/2018 4.4  3.6 - 5.1 g/dL Final  . Globulin 01/10/2018 2.6  1.9 - 3.7 g/dL (calc) Final  . AG Ratio 01/10/2018 1.7  1.0 - 2.5 (calc) Final  . Total Bilirubin 01/10/2018 0.7  0.2 - 1.2 mg/dL Final  . Bilirubin, Direct 01/10/2018 0.1  0.0 - 0.2 mg/dL Final  . Indirect Bilirubin  01/10/2018 0.6  0.2 - 1.2 mg/dL (calc) Final  . Alkaline phosphatase (APISO) 01/10/2018 57  33 - 130 U/L Final  . AST 01/10/2018 22  10 -  35 U/L Final  . ALT 01/10/2018 21  6 - 29 U/L Final  . WBC 01/10/2018 8.2  3.8 - 10.8 Thousand/uL Final  . RBC 01/10/2018 4.35  3.80 - 5.10 Million/uL Final  . Hemoglobin 01/10/2018 13.9  11.7 - 15.5 g/dL Final  . HCT 01/10/2018 40.1  35.0 - 45.0 % Final  . MCV 01/10/2018 92.2  80.0 - 100.0 fL Final  . MCH 01/10/2018 32.0  27.0 - 33.0 pg Final  . MCHC 01/10/2018 34.7  32.0 - 36.0 g/dL Final  . RDW 01/10/2018 12.1  11.0 - 15.0 % Final  . Platelets 01/10/2018 192  140 - 400 Thousand/uL Final  . MPV 01/10/2018 11.0  7.5 - 12.5 fL Final  . Neutro Abs 01/10/2018 5,076  1,500 - 7,800 cells/uL Final  . Lymphs Abs 01/10/2018 2,189  850 - 3,900 cells/uL Final  . WBC mixed population 01/10/2018 705  200 - 950 cells/uL Final  . Eosinophils Absolute 01/10/2018 180  15 - 500 cells/uL Final  . Basophils Absolute 01/10/2018 49  0 - 200 cells/uL Final  . Neutrophils Relative % 01/10/2018 61.9  % Final  . Total Lymphocyte 01/10/2018 26.7  % Final  . Monocytes Relative 01/10/2018 8.6  % Final  . Eosinophils Relative 01/10/2018 2.2  % Final  . Basophils Relative 01/10/2018 0.6  % Final  . QuantiFERON-TB Gold Plus 01/10/2018 NEGATIVE  NEGATIVE Final   Comment: Negative test result. M. tuberculosis complex  infection unlikely.   Marland Kitchen NIL 01/10/2018 0.02  IU/mL Final  . Mitogen-NIL 01/10/2018 >10.00  IU/mL Final  . TB1-NIL 01/10/2018 0.01  IU/mL Final  . TB2-NIL 01/10/2018 0.01  IU/mL Final   Comment: . The Nil tube value reflects the background interferon gamma immune response of the patient's blood sample. This value has been subtracted from the patient's displayed TB and Mitogen results. . Lower than expected results with the Mitogen tube prevent false-negative Quantiferon readings by detecting a patient with a potential immune suppressive condition and/or  suboptimal pre-analytical specimen handling. . The TB1 Antigen tube is coated with the M. tuberculosis-specific antigens designed to elicit responses from TB antigen primed CD4+ helper T-lymphocytes. . The TB2 Antigen tube is coated with the M. tuberculosis-specific antigens designed to elicit responses from TB antigen primed CD4+ helper and CD8+ cytotoxic T-lymphocytes. . For additional information, please refer to http://education.questdiagnostics.com/faq/204 (This link is being provided for informational/ educational purposes only.) .     STUDIES: No results found.  ASSESSMENT: Bilateral adenocarcinoma of the breast (right breast, stage I, T1c N0 M0 (isolated tumor cells in 1 lymph node; left breast, stage II, T2 N1 M0.)  PLAN:    1. Bilateral adenocarcinoma of the breast: Patient is status post bilateral radical mastectomies in February 2015. She also received adjuvant chemotherapy with Adriamycin, Cytoxan, and Taxol completed in approximately October 2015. She then initiated letrozole, but could not tolerate secondary to leg pain and was switched to Aromasin.  Patient then switched to tamoxifen in October 2019 secondary to cost.  She is tolerating this well without significant side effects.  She will complete her 5 years of treatment in October 2021. Patient does not require mammograms given her bilateral mastectomy. Return to clinic in 6 months for routine evaluation. 2. Postmenopausal: Bone mineral density on August 01, 2016 reveald a T-score of -1.2. Patient has been instructed to continue her calcium and vitamin D supplementation. Continue monitoring bone mineral density by PCP. 3.  Multiple sclerosis: Continue evaluation and treatment per neurology.  Patient expressed understanding and was in agreement with this plan. She also understands that She can call clinic at any time with any questions, concerns, or complaints.   Breast cancer bilateral, multifocal ER/PR pos,  Her 2 neg,.   Staging form: Breast, AJCC 7th Edition     Clinical: Stage IIB (T2, N1, M0) - Signed by Forest Gleason, MD on 04/22/2015   Lloyd Huger, MD   01/21/2018 11:08 AM

## 2018-01-18 ENCOUNTER — Inpatient Hospital Stay: Payer: Medicare Other | Attending: Oncology | Admitting: Oncology

## 2018-01-18 ENCOUNTER — Encounter: Payer: Self-pay | Admitting: Oncology

## 2018-01-18 ENCOUNTER — Other Ambulatory Visit: Payer: Self-pay

## 2018-01-18 VITALS — BP 127/82 | HR 82 | Temp 98.2°F | Resp 12 | Ht 65.5 in | Wt 200.4 lb

## 2018-01-18 DIAGNOSIS — Z79811 Long term (current) use of aromatase inhibitors: Secondary | ICD-10-CM

## 2018-01-18 DIAGNOSIS — Z9221 Personal history of antineoplastic chemotherapy: Secondary | ICD-10-CM | POA: Diagnosis not present

## 2018-01-18 DIAGNOSIS — G4730 Sleep apnea, unspecified: Secondary | ICD-10-CM | POA: Insufficient documentation

## 2018-01-18 DIAGNOSIS — G2581 Restless legs syndrome: Secondary | ICD-10-CM | POA: Insufficient documentation

## 2018-01-18 DIAGNOSIS — E538 Deficiency of other specified B group vitamins: Secondary | ICD-10-CM | POA: Insufficient documentation

## 2018-01-18 DIAGNOSIS — E669 Obesity, unspecified: Secondary | ICD-10-CM | POA: Insufficient documentation

## 2018-01-18 DIAGNOSIS — G35 Multiple sclerosis: Secondary | ICD-10-CM | POA: Diagnosis not present

## 2018-01-18 DIAGNOSIS — Z9013 Acquired absence of bilateral breasts and nipples: Secondary | ICD-10-CM

## 2018-01-18 DIAGNOSIS — Z96652 Presence of left artificial knee joint: Secondary | ICD-10-CM | POA: Diagnosis not present

## 2018-01-18 DIAGNOSIS — C50912 Malignant neoplasm of unspecified site of left female breast: Secondary | ICD-10-CM | POA: Diagnosis not present

## 2018-01-18 DIAGNOSIS — E1140 Type 2 diabetes mellitus with diabetic neuropathy, unspecified: Secondary | ICD-10-CM | POA: Diagnosis not present

## 2018-01-18 DIAGNOSIS — Z7984 Long term (current) use of oral hypoglycemic drugs: Secondary | ICD-10-CM | POA: Diagnosis not present

## 2018-01-18 DIAGNOSIS — C50911 Malignant neoplasm of unspecified site of right female breast: Secondary | ICD-10-CM | POA: Diagnosis not present

## 2018-01-18 DIAGNOSIS — Z780 Asymptomatic menopausal state: Secondary | ICD-10-CM | POA: Insufficient documentation

## 2018-01-18 DIAGNOSIS — Z170 Estrogen receptor positive status [ER+]: Secondary | ICD-10-CM | POA: Insufficient documentation

## 2018-01-18 DIAGNOSIS — Z87891 Personal history of nicotine dependence: Secondary | ICD-10-CM | POA: Insufficient documentation

## 2018-01-18 DIAGNOSIS — I10 Essential (primary) hypertension: Secondary | ICD-10-CM | POA: Diagnosis not present

## 2018-01-18 DIAGNOSIS — Z79899 Other long term (current) drug therapy: Secondary | ICD-10-CM | POA: Insufficient documentation

## 2018-01-18 DIAGNOSIS — C50212 Malignant neoplasm of upper-inner quadrant of left female breast: Secondary | ICD-10-CM

## 2018-01-18 NOTE — Progress Notes (Signed)
Patient here for follow up she has generalized pain. She is being treated for MS. She has been constipated.

## 2018-01-19 ENCOUNTER — Other Ambulatory Visit: Payer: Self-pay | Admitting: Unknown Physician Specialty

## 2018-01-19 NOTE — Telephone Encounter (Signed)
Tecfidera approval received from [facility] valid until 01/18/2020.

## 2018-01-20 ENCOUNTER — Ambulatory Visit (INDEPENDENT_AMBULATORY_CARE_PROVIDER_SITE_OTHER): Payer: Medicare Other | Admitting: Nurse Practitioner

## 2018-01-20 ENCOUNTER — Encounter: Payer: Self-pay | Admitting: Nurse Practitioner

## 2018-01-20 ENCOUNTER — Other Ambulatory Visit: Payer: Self-pay | Admitting: Nurse Practitioner

## 2018-01-20 ENCOUNTER — Telehealth: Payer: Self-pay | Admitting: Neurology

## 2018-01-20 ENCOUNTER — Other Ambulatory Visit: Payer: Self-pay

## 2018-01-20 VITALS — BP 146/83 | HR 76 | Temp 98.1°F | Ht 65.0 in | Wt 200.0 lb

## 2018-01-20 DIAGNOSIS — R809 Proteinuria, unspecified: Secondary | ICD-10-CM

## 2018-01-20 DIAGNOSIS — K5903 Drug induced constipation: Secondary | ICD-10-CM | POA: Diagnosis not present

## 2018-01-20 DIAGNOSIS — K219 Gastro-esophageal reflux disease without esophagitis: Secondary | ICD-10-CM

## 2018-01-20 DIAGNOSIS — E1165 Type 2 diabetes mellitus with hyperglycemia: Secondary | ICD-10-CM

## 2018-01-20 DIAGNOSIS — E1169 Type 2 diabetes mellitus with other specified complication: Secondary | ICD-10-CM

## 2018-01-20 DIAGNOSIS — J301 Allergic rhinitis due to pollen: Secondary | ICD-10-CM | POA: Diagnosis not present

## 2018-01-20 DIAGNOSIS — F5101 Primary insomnia: Secondary | ICD-10-CM

## 2018-01-20 DIAGNOSIS — E785 Hyperlipidemia, unspecified: Secondary | ICD-10-CM | POA: Diagnosis not present

## 2018-01-20 DIAGNOSIS — M5416 Radiculopathy, lumbar region: Secondary | ICD-10-CM

## 2018-01-20 DIAGNOSIS — R000 Tachycardia, unspecified: Secondary | ICD-10-CM

## 2018-01-20 LAB — POCT UA - MICROALBUMIN: Microalbumin Ur, POC: 50 mg/L

## 2018-01-20 LAB — POCT GLYCOSYLATED HEMOGLOBIN (HGB A1C): Hemoglobin A1C: 6.1

## 2018-01-20 MED ORDER — TRAZODONE HCL 50 MG PO TABS: 100.0000 mg | ORAL_TABLET | Freq: Every day | ORAL | 0 refills | Status: DC

## 2018-01-20 MED ORDER — CARVEDILOL 3.125 MG PO TABS: 3.1250 mg | ORAL_TABLET | Freq: Two times a day (BID) | ORAL | 1 refills | Status: DC

## 2018-01-20 MED ORDER — MELOXICAM 7.5 MG PO TABS: 7.5000 mg | ORAL_TABLET | Freq: Every day | ORAL | 1 refills | Status: DC

## 2018-01-20 MED ORDER — DOCUSATE SODIUM 250 MG PO CAPS: 250.0000 mg | ORAL_CAPSULE | Freq: Every day | ORAL | 0 refills | Status: DC

## 2018-01-20 MED ORDER — FLUTICASONE PROPIONATE 50 MCG/ACT NA SUSP: 2.0000 | Freq: Every day | NASAL | 12 refills | Status: DC

## 2018-01-20 MED ORDER — METFORMIN HCL 500 MG PO TABS: 500.0000 mg | ORAL_TABLET | Freq: Two times a day (BID) | ORAL | 3 refills | Status: DC

## 2018-01-20 MED ORDER — POLYETHYLENE GLYCOL 3350 17 GM/SCOOP PO POWD: 17.0000 g | Freq: Two times a day (BID) | ORAL | 1 refills | Status: DC | PRN

## 2018-01-20 MED ORDER — FAMOTIDINE 20 MG PO TABS: 20.0000 mg | ORAL_TABLET | Freq: Every day | ORAL | 1 refills | Status: DC

## 2018-01-20 NOTE — Telephone Encounter (Signed)
Patient called back regarding the Tecfidera medication. She said it is also too expensive. Please Call. Thanks

## 2018-01-20 NOTE — Assessment & Plan Note (Signed)
Stable, persistent pan-cycle disturbance on trazodone at bedtime.  Patient notes continued insomnia w/less than 5 hours sleep per night even using trazodone 100 mg.  Not much change when not taking trazodone.  Plan: 1.  Continue trazodone 100 mg daily at bedtime.  Refill provided.  Encouraged 7-day drug holiday once every 3 months. 2.  Encouraged adequate sleep hygiene. 3.  Follow-up 3 months

## 2018-01-20 NOTE — Progress Notes (Signed)
Subjective:    Patient ID: Cassidy Norris, female    DOB: [birth year], 67 y.o.   MRN: [id number]  Cassidy Norris is a 67 y.o. female presenting on 01/20/2018 for Multiple Sclerosis (needs blood work after she starts a new medication) Diabetes, GERD, Constipation  HPI MS Had gone to Dr. Tomi Likens w/ Neurology on Feb 18th.  Wants to start her on medications for symptoms.  Pt notes several contacts for financial assistance for medications, but still has not started any med. RN called Thursday and asked about starting medication.  Pt remains confused and has not started meds.  She is concerned most about medication cost.   GERD Famotidine 1 tab twice daily and not having any symptoms of heartburn, abdominal pain, nausea, vomiting, diarrhea, or blood in stools. - Constipation (new w/ cancer med) now very hard and is "like pellets."  Hyperlipidemia  Taking OTC fish oil - Lovaza was too expensive.  Has not started any statin in past and currently declines for DM management.  Pt reports some dietary indiscretions, but tries to eat low glycemic diet. Pt denies changes in vision, chest tightness/pressure, palpitations, shortness of breath, leg pain while walking, leg or arm weakness, and sudden loss of speech or loss of consciousness.  Diabetes Pt presents today for follow up of Type 2 diabetes mellitus. She is checking fasting am CBG at home with a range of 120-130 - Current diabetic medications include: metformin - She is not currently symptomatic.  - She denies polydipsia, polyphagia, polyuria, headaches, diaphoresis, shakiness, chills, pain, numbness or tingling in extremities and changes in vision.   - Clinical course has been unchanged. - She  reports an exercise routine that includes walking, 5 days per week. - Her diet is moderate in salt, moderate in fat, and moderate in carbohydrates. - Weight trend: stable  PREVENTION: Eye exam current (within one year): yes Foot exam current (within  one year): no Lipid/ASCVD risk reduction - on statin: declines to start Kidney protection - on ace or arb: declines to start  Recent Labs    10/22/17 0834 01/20/18 1027  HGBA1C 6.2* 6.1   Recent Labs    01/20/18 1027  MICROALBUR 50    Social History   Tobacco Use  . Smoking status: Former Smoker    Packs/day: 1.00    Years: 30.00    Pack years: 30.00    Types: Cigarettes    Last attempt to quit: 12/11/2013    Years since quitting: 4.1  . Smokeless tobacco: Never Used  Substance Use Topics  . Alcohol use: No    Alcohol/week: 0.0 oz    Comment: social  . Drug use: No    Review of Systems Per HPI unless specifically indicated above    Objective:    BP (!) 146/83 (BP Location: Right Arm, Patient Position: Sitting, Cuff Size: Normal)   Pulse 76   Temp 98.1 F (36.7 C)   Ht 5\' 5"  (1.651 m)   Wt 200 lb (90.7 kg)   LMP 12/22/1999 (Approximate)   BMI 33.28 kg/m   Wt Readings from Last 3 Encounters:  01/20/18 200 lb (90.7 kg)  01/18/18 200 lb 6.4 oz (90.9 kg)  01/10/18 201 lb (91.2 kg)    Physical Exam  Constitutional: She is oriented to person, place, and time. She appears well-developed and well-nourished. No distress.  Neck: Normal range of motion. Neck supple. Carotid bruit is not present.  Cardiovascular: Normal rate, regular rhythm, S1 normal, S2 normal,  normal heart sounds and intact distal pulses.  Pulmonary/Chest: Effort normal and breath sounds normal. No respiratory distress.  Abdominal: Soft. She exhibits distension. Bowel sounds are increased. There is no hepatosplenomegaly. There is no tenderness.  Musculoskeletal: She exhibits no edema (pedal).  Neurological: She is alert and oriented to person, place, and time.  Skin: Skin is warm and dry.  Psychiatric: Her speech is normal and behavior is normal. Her mood appears anxious.  Vitals reviewed.   Results for orders placed or performed in visit on 01/20/18  POCT UA - Microalbumin  Result Value Ref  Range   Microalbumin Ur, POC 50 mg/L   Creatinine, POC  mg/dL   Albumin/Creatinine Ratio, Urine, POC    POCT HgB A1C  Result Value Ref Range   Hemoglobin A1C 6.1       Assessment & Plan:   Problem List Items Addressed This Visit      Digestive   Gastroesophageal reflux disease    Stable symptoms on famotidine 20 mg bid.  Pt requests refill today.  Refill provided to continue at current dose.  Liver function normal on last labs 01/10/2018      Relevant Medications   docusate sodium (COLACE) 250 MG capsule   polyethylene glycol powder (GLYCOLAX/MIRALAX) powder   famotidine (PEPCID) 20 MG tablet     Endocrine   Type 2 diabetes mellitus with hyperglycemia, without long-term current use of insulin ([place]) - Primary    Well controlled currently.  AM fasting glucose elevated to > 130 after being awake even without eating.  Patient taking metformin 500 mg bid and tolerating well.  A1c today controlled at 6.2% and is less than goal of <7%. - Complicated by hyperlipidemia (pt refuses statin) and microalbuminuria (pt refuses ACEi/ARB)  Plan: 1.  Continue metformin 500 mg twice daily. 2.  Discussed A1c goal and result with pt today.  Discussed microalbumin and lipids with pt today and discussed need for medications to prevent long-term complications.  Pt continues to refuse additional meds. 3.  Encourage patient to eat low carb diet.  Encouraged continuing exercise 4 times a week at the gym. 4.  Labs last on 09/2017 for lipids were elevated.  Labs on 01/10/2018 were also WNL for liver and kidney function.   5.  Follow-up in 3 months      Relevant Medications   metFORMIN (GLUCOPHAGE) 500 MG tablet   Other Relevant Orders   POCT UA - Microalbumin (Completed)   POCT HgB A1C (Completed)   Hyperlipidemia associated with type 2 diabetes mellitus (HCC)   Relevant Medications   metFORMIN (GLUCOPHAGE) 500 MG tablet     Other   Insomnia    Stable, persistent pan-cycle disturbance on trazodone at  bedtime.  Patient notes continued insomnia w/less than 5 hours sleep per night even using trazodone 100 mg.  Not much change when not taking trazodone.  Plan: 1.  Continue trazodone 100 mg daily at bedtime.  Refill provided.  Encouraged 7-day drug holiday once every 3 months. 2.  Encouraged adequate sleep hygiene. 3.  Follow-up 3 months      Relevant Medications   traZODone (DESYREL) 50 MG tablet   Microalbuminuria    Worsening over last 1 year.  Encouraged pt to start ACEi/ARB and refused.  Follow again in 6 months at office visit.  Will attempt to start ACEi/ARB for pt in 3 months again as well.       Other Visit Diagnoses    Drug-induced constipation  Pt notes new constipation after starting Tamoxifen.  Pt having daily BM, but is very hard.  Plan: 1. Increase water and dietary fiber intake 2. START Colace 250 mg one capsule once daily. 3. START Miralax powder bid if no BM in 3 days. 4. Followup as needed.   Relevant Medications   docusate sodium (COLACE) 250 MG capsule   polyethylene glycol powder (GLYCOLAX/MIRALAX) powder   Non-seasonal allergic rhinitis due to pollen     Pt with good control of symptoms on fluticasone.  Requests refill today.  Refill provided.   Relevant Medications   fluticasone (FLONASE) 50 MCG/ACT nasal spray      Meds ordered this encounter  Medications  . docusate sodium (COLACE) 250 MG capsule    Sig: Take 1 capsule (250 mg total) by mouth daily.    Dispense:  30 capsule    Refill:  0    Order Specific Question:   Supervising Provider    Answer:   [name] [[id number]]  . polyethylene glycol powder (GLYCOLAX/MIRALAX) powder    Sig: Take 17 g by mouth 2 (two) times daily as needed for moderate constipation.    Dispense:  3350 g    Refill:  1    Order Specific Question:   Supervising Provider    Answer:   [name] [[id number]]  . fluticasone (FLONASE) 50 MCG/ACT nasal spray    Sig: Place 2 sprays into both nostrils at  bedtime.    Dispense:  16 g    Refill:  12    Order Specific Question:   Supervising Provider    Answer:   [name] [[id number]]  . famotidine (PEPCID) 20 MG tablet    Sig: Take 1 tablet (20 mg total) by mouth daily.    Dispense:  90 tablet    Refill:  1    Order Specific Question:   Supervising Provider    Answer:   [name] [[id number]]  . metFORMIN (GLUCOPHAGE) 500 MG tablet    Sig: Take 1 tablet (500 mg total) by mouth 2 (two) times daily with a meal. For first 2 weeks, just take 1 in the AM and then increase to BID    Dispense:  180 tablet    Refill:  3    Order Specific Question:   Supervising Provider    Answer:   [name] [[id number]]  . traZODone (DESYREL) 50 MG tablet    Sig: Take 2 tablets (100 mg total) by mouth at bedtime.    Dispense:  180 tablet    Refill:  0    Order Specific Question:   Supervising Provider    Answer:   [name] [[id number]]    A total of 40 minutes was spent face-to-face with this patient. Greater than 50% of this time was spent in counseling and coordination of care with the patient.    Follow up plan: Return in about 3 months (around 04/19/2018) for Diabetes Mellitus.  [name], DNP, AGPCNP-BC Adult Gerontology Primary Care Nurse Practitioner [facility] 01/20/2018, 11:53 AM

## 2018-01-20 NOTE — Patient Instructions (Addendum)
Cassidy Norris, Thank you for coming in to clinic today.  1. You may qualify for assistance paying for your medications through Medicare through the Medicare Extra Help program if you are at or below the Federal Poverty Level.  If you are at <150% of the Federal Poverty Level, you may qualify for partial assistance.  Either program can help you remove the "donut hole"/coverage gap from your medication coverage insurance.  Please choose one of the following 3 options to learn more and apply for additional medication assistance. 1. Visit the website: PoleJobs.co.nz 2. Call Social Security: [phone] 3. Visit your county Social Security office.  If you qualify for Extra Help, you will also qualify to enroll in Medicare Part D or change your Medicare Part D plan to one that offers more medication coverage.   ** Visit https://www.porter.info/ or call Medicare at 1-(408)630-1901. **  2. For constipation: take daily colace and Miralax up to twice daily as needed for constipation lasting longer than 3 days between bowel movements.  Please schedule a follow-up appointment with Cassidy Norris, AGNP. Return in about 3 months (around 04/19/2018) for Diabetes Mellitus.  If you have any other questions or concerns, please feel free to call the clinic or send a message through [place]. You may also schedule an earlier appointment if necessary.  You will receive a survey after today's visit either digitally by e-mail or paper by [employer]. Your experiences and feedback matter to [place].  Please respond so we know how we are doing as we provide care for you.   Cassidy Smiles, DNP, AGNP-BC Adult Gerontology Nurse Practitioner [place]

## 2018-01-20 NOTE — Assessment & Plan Note (Signed)
Stable symptoms on famotidine 20 mg bid.  Pt requests refill today.  Refill provided to continue at current dose.  Liver function normal on last labs 01/10/2018

## 2018-01-20 NOTE — Assessment & Plan Note (Addendum)
Well controlled currently.  AM fasting glucose elevated to > 130 after being awake even without eating.  Patient taking metformin 500 mg bid and tolerating well.  A1c today controlled at 6.2% and is less than goal of <7%. - Complicated by hyperlipidemia (pt refuses statin) and microalbuminuria (pt refuses ACEi/ARB)  Plan: 1.  Continue metformin 500 mg twice daily. 2.  Discussed A1c goal and result with pt today.  Discussed microalbumin and lipids with pt today and discussed need for medications to prevent long-term complications.  Pt continues to refuse additional meds. 3.  Encourage patient to eat low carb diet.  Encouraged continuing exercise 4 times a week at the gym. 4.  Labs last on 09/2017 for lipids were elevated.  Labs on 01/10/2018 were also WNL for liver and kidney function.   5.  Follow-up in 3 months

## 2018-01-20 NOTE — Telephone Encounter (Signed)
Specialty pharmacy lmom needing clarification on what RX she needs. Please Call [phone].  Thanks

## 2018-01-20 NOTE — Assessment & Plan Note (Signed)
Worsening over last 1 year.  Encouraged pt to start ACEi/ARB and refused.  Follow again in 6 months at office visit.  Will attempt to start ACEi/ARB for pt in 3 months again as well.

## 2018-01-20 NOTE — Telephone Encounter (Signed)
Please advise 

## 2018-01-21 ENCOUNTER — Telehealth: Payer: Self-pay | Admitting: Neurology

## 2018-01-21 MED ORDER — LISINOPRIL 5 MG PO TABS
5.0000 mg | ORAL_TABLET | Freq: Every day | ORAL | 3 refills | Status: DC
Start: 2018-01-21 — End: 2018-04-20

## 2018-01-21 NOTE — Telephone Encounter (Signed)
Patient called and said that she spoke with Caryl Pina and that the [employer]

## 2018-01-21 NOTE — Telephone Encounter (Signed)
Medication was approved but there are no orders sent for that medication? She said the fax # is [phone] and Melissa is who she spoke with. The Patient service # is [phone]. Shirell will be available around 2:30 today. Thanks

## 2018-01-21 NOTE — Telephone Encounter (Signed)
If the orals are too expensive, then I would recommend one of the injections such as Rebif or Copaxone.  They can both be administered 3 times a week.  We can send her literature regarding the medications if she would like to read about them (such as potential side effects) first.

## 2018-01-21 NOTE — Telephone Encounter (Signed)
I will fax Rx.

## 2018-01-21 NOTE — Telephone Encounter (Signed)
I spoke with patient. She has very limited resources for medications, but also has a phobia against needles and can not give herself injections. She was given patient assistance paperwork, but was told that she couldn't get patient assistance with insurance. I let her know that many patients get patient assistance with insurance and encouraged her to fill out the patient assistance paperwork. If she is denied we will contact the drug representative and see what other options are available. She agrees with this plan and will submit paperwork.

## 2018-01-24 ENCOUNTER — Other Ambulatory Visit: Payer: Self-pay | Admitting: *Deleted

## 2018-01-24 DIAGNOSIS — G35 Multiple sclerosis: Secondary | ICD-10-CM

## 2018-01-24 MED ORDER — DIMETHYL FUMARATE 120 & 240 MG PO MISC: 1.0000 | ORAL | 3 refills | Status: DC

## 2018-01-25 ENCOUNTER — Ambulatory Visit
Admission: RE | Admit: 2018-01-25 | Discharge: 2018-01-25 | Disposition: A | Discharge status: Home / Self Care | Payer: Medicare Other | Source: Ambulatory Visit | Attending: Neurology | Admitting: Neurology

## 2018-01-25 DIAGNOSIS — G35 Multiple sclerosis: Secondary | ICD-10-CM | POA: Diagnosis not present

## 2018-01-25 LAB — POCT I-STAT CREATININE: CREATININE: 0.8 mg/dL (ref 0.44–1.00)

## 2018-01-25 MED ORDER — GADOBENATE DIMEGLUMINE 529 MG/ML IV SOLN
20.0000 mL | Freq: Once | INTRAVENOUS | Status: AC | PRN
  Administered 2018-01-25: 19 mL via INTRAVENOUS

## 2018-01-31 ENCOUNTER — Encounter: Payer: Self-pay | Admitting: Neurology

## 2018-02-01 ENCOUNTER — Other Ambulatory Visit: Payer: Self-pay | Admitting: *Deleted

## 2018-02-01 DIAGNOSIS — G35 Multiple sclerosis: Secondary | ICD-10-CM

## 2018-02-01 MED ORDER — DIMETHYL FUMARATE 120 & 240 MG PO MISC: 1.0000 | ORAL | 3 refills | Status: DC

## 2018-02-07 ENCOUNTER — Encounter: Payer: Self-pay | Admitting: Neurology

## 2018-02-10 ENCOUNTER — Telehealth: Payer: Self-pay

## 2018-02-10 NOTE — Telephone Encounter (Signed)
Spoke with pt relaying recent MRI results.

## 2018-02-18 NOTE — Progress Notes (Signed)
Representative called to say, received written Rx but did not receive enrollment form. Faxed Tefidera enrollment form.

## 2018-02-21 ENCOUNTER — Other Ambulatory Visit: Payer: Self-pay | Admitting: Unknown Physician Specialty

## 2018-02-21 ENCOUNTER — Other Ambulatory Visit: Payer: Self-pay | Admitting: Oncology

## 2018-02-21 DIAGNOSIS — R000 Tachycardia, unspecified: Secondary | ICD-10-CM

## 2018-02-23 NOTE — Progress Notes (Addendum)
Rcvd VM form Pt, she stated she has heard back from [place] concerning Reynaldo Minium and was told an Rx needed to be sent to [employer], they are the supplier for [place] for [place] [phone]. Called [facility], spoke with Luanna Salk, he said has not rcvd all info from [place], will contact us directly if any further info is needed. Called Pt, LM on VM advising her.  Rcvd call from [place], spoke with Edison Nasuti. Gave verbal Rx.

## 2018-02-24 ENCOUNTER — Telehealth: Payer: Self-pay | Admitting: Neurology

## 2018-02-24 DIAGNOSIS — E559 Vitamin D deficiency, unspecified: Secondary | ICD-10-CM

## 2018-02-24 DIAGNOSIS — G35 Multiple sclerosis: Secondary | ICD-10-CM

## 2018-02-24 NOTE — Telephone Encounter (Signed)
Unless I have another concern, I check labs (CBC with diff and CMP) every 6 months.  I would typically have her follow up in 6 months after starting the Tecfidera.  Recommend checking CBC with diff, CMP and vitamin D level about a week prior to follow up.

## 2018-02-24 NOTE — Telephone Encounter (Signed)
Patient called needing to speak with you regarding information on her Tecfidera medication that she will be starting on Monday. Please Call. Thanks

## 2018-02-24 NOTE — Telephone Encounter (Signed)
Called and LM on VM, as Pt rqstd. Advsd her of labs to have on e week prior to follow-up

## 2018-02-24 NOTE — Telephone Encounter (Signed)
Pt called, wanted to make sure she was able to get labs in [place], is a [facility] provider. Advsd her she could, changed her appt from Aug to Sept.

## 2018-02-24 NOTE — Telephone Encounter (Signed)
Called and spoke with Pt. She is questioning if she will need labs every month with Tecfidera like she was to have with [facility]. Also, should she push her f/u appt out another month since she is not starting Tecfiderta until Monday 02/28/18.

## 2018-03-08 LAB — HM DIABETES EYE EXAM

## 2018-03-17 ENCOUNTER — Encounter: Payer: Self-pay | Admitting: Nurse Practitioner

## 2018-03-17 ENCOUNTER — Encounter: Payer: Self-pay | Admitting: Neurology

## 2018-03-18 ENCOUNTER — Other Ambulatory Visit: Payer: Self-pay | Admitting: Nurse Practitioner

## 2018-03-18 DIAGNOSIS — E1169 Type 2 diabetes mellitus with other specified complication: Secondary | ICD-10-CM

## 2018-03-18 DIAGNOSIS — Z79899 Other long term (current) drug therapy: Secondary | ICD-10-CM

## 2018-03-18 DIAGNOSIS — E785 Hyperlipidemia, unspecified: Secondary | ICD-10-CM

## 2018-03-18 DIAGNOSIS — E1165 Type 2 diabetes mellitus with hyperglycemia: Secondary | ICD-10-CM

## 2018-03-18 DIAGNOSIS — I10 Essential (primary) hypertension: Secondary | ICD-10-CM

## 2018-03-18 NOTE — Telephone Encounter (Signed)
Please schedule lab appointment.

## 2018-04-01 ENCOUNTER — Ambulatory Visit (INDEPENDENT_AMBULATORY_CARE_PROVIDER_SITE_OTHER): Payer: Medicare Other | Admitting: Podiatry

## 2018-04-01 DIAGNOSIS — M79609 Pain in unspecified limb: Secondary | ICD-10-CM

## 2018-04-01 DIAGNOSIS — B351 Tinea unguium: Secondary | ICD-10-CM

## 2018-04-01 DIAGNOSIS — M79676 Pain in unspecified toe(s): Secondary | ICD-10-CM | POA: Diagnosis not present

## 2018-04-01 DIAGNOSIS — E0843 Diabetes mellitus due to underlying condition with diabetic autonomic (poly)neuropathy: Secondary | ICD-10-CM

## 2018-04-04 NOTE — Progress Notes (Signed)
   SUBJECTIVE 67 year old patient with a history of diabetes mellitus presents to office today as a new patient with a chief complaint of her right great toenail becoming detached after she dropped a shampoo bottle on it two years ago. She has not done anything for treatment. There are no modifying factors noted.  She is also complaining of elongated, thickened nails that cause pain while ambulating in shoes. She is unable to trim her own nails. Patient is here for further evaluation and treatment.   Past Medical History:  Diagnosis Date  . Anemia   . B12 deficiency 07/23/2016   Will check levels to determine if injections are needed again. Await results. Recent CBC at Onc WNL  . Cancer [facility]) 2014   right breast - Invasive Mammary Carcinoma  . Cancer [facility]) 2014   left breast - Invasive lobular carcinoma and DCIS   . Diabetes mellitus without complication ([place])   . Diffuse cystic mastopathy   . Essential hypertension, benign 09/24/2017  . Family history of malignant neoplasm of gastrointestinal tract 2012  . Fractured elbow 08/28/14  . Hx of metabolic acidosis with increased anion gap   . Increased heart rate   . Multiple sclerosis ([place])   . Neuromuscular disorder ([place])    neuropathy in bil feet - left is worse.  . Obesity, unspecified   . Peripheral neuropathy   . Personal history of tobacco use, presenting hazards to health   . Restless leg syndrome   . Sleep apnea   . Special screening for malignant neoplasms, colon     OBJECTIVE General Patient is awake, alert, and oriented x 3 and in no acute distress. Derm Skin is dry and supple bilateral. Negative open lesions or macerations. Remaining integument unremarkable. Nails are tender, long, thickened and dystrophic with subungual debris, consistent with onychomycosis, 1-5 bilateral. No signs of infection noted. Hyperkeratotic, discolored, thickened, onychodystrophy of the right great toenail. Skin is warm, dry and supple bilateral  lower extremities. Negative for open lesions or macerations. Vasc  DP and PT pedal pulses palpable bilaterally. Temperature gradient within normal limits.  Neuro Epicritic and protective threshold sensation diminished bilaterally.  Musculoskeletal Exam No symptomatic pedal deformities noted bilateral. Muscular strength within normal limits.  ASSESSMENT 1. Diabetes Mellitus w/ peripheral neuropathy 2. Onychomycosis of nail due to dermatophyte bilateral 3. Pain in foot bilateral 4. Dystrophic nail right hallux   PLAN OF CARE 1. Patient evaluated today. 2. Instructed to maintain good pedal hygiene and foot care. Stressed importance of controlling blood sugar.  3. Mechanical debridement of nails 1-5 bilaterally performed using a nail nipper. Filed with dremel without incident.  4. Return to clinic annually.      Edrick Kins, DPM [facility]  Dr. Edrick Kins, [address]                                        [place], [place] [address]                Office [phone]  Fax [phone]

## 2018-04-13 ENCOUNTER — Other Ambulatory Visit: Payer: Self-pay

## 2018-04-13 DIAGNOSIS — E1169 Type 2 diabetes mellitus with other specified complication: Secondary | ICD-10-CM

## 2018-04-13 DIAGNOSIS — Z79899 Other long term (current) drug therapy: Secondary | ICD-10-CM

## 2018-04-13 DIAGNOSIS — E785 Hyperlipidemia, unspecified: Secondary | ICD-10-CM

## 2018-04-13 DIAGNOSIS — E1165 Type 2 diabetes mellitus with hyperglycemia: Secondary | ICD-10-CM

## 2018-04-13 DIAGNOSIS — I10 Essential (primary) hypertension: Secondary | ICD-10-CM

## 2018-04-14 ENCOUNTER — Other Ambulatory Visit: Payer: Medicare Other

## 2018-04-14 DIAGNOSIS — I10 Essential (primary) hypertension: Secondary | ICD-10-CM | POA: Diagnosis not present

## 2018-04-14 DIAGNOSIS — Z79899 Other long term (current) drug therapy: Secondary | ICD-10-CM | POA: Diagnosis not present

## 2018-04-14 DIAGNOSIS — E1169 Type 2 diabetes mellitus with other specified complication: Secondary | ICD-10-CM | POA: Diagnosis not present

## 2018-04-14 DIAGNOSIS — E785 Hyperlipidemia, unspecified: Secondary | ICD-10-CM | POA: Diagnosis not present

## 2018-04-14 DIAGNOSIS — E1165 Type 2 diabetes mellitus with hyperglycemia: Secondary | ICD-10-CM | POA: Diagnosis not present

## 2018-04-15 LAB — COMPLETE METABOLIC PANEL WITH GFR
AG Ratio: 1.6 (calc) (ref 1.0–2.5)
ALT: 20 U/L (ref 6–29)
AST: 23 U/L (ref 10–35)
Albumin: 4 g/dL (ref 3.6–5.1)
Alkaline phosphatase (APISO): 42 U/L (ref 33–130)
BUN: 20 mg/dL (ref 7–25)
CO2: 28 mmol/L (ref 20–32)
Calcium: 9.6 mg/dL (ref 8.6–10.4)
Chloride: 104 mmol/L (ref 98–110)
Creat: 0.83 mg/dL (ref 0.50–0.99)
GFR, Est African American: 86 mL/min/{1.73_m2} (ref >=60)
GFR, Est Non African American: 74 mL/min/{1.73_m2} (ref >=60)
Globulin: 2.5 g/dL (calc) (ref 1.9–3.7)
Glucose, Bld: 95 mg/dL (ref 65–99)
Potassium: 4.3 mmol/L (ref 3.5–5.3)
Sodium: 141 mmol/L (ref 135–146)
Total Bilirubin: 0.6 mg/dL (ref 0.2–1.2)
Total Protein: 6.5 g/dL (ref 6.1–8.1)

## 2018-04-15 LAB — HEMOGLOBIN A1C
Hgb A1c MFr Bld: 5.9 % of total Hgb — ABNORMAL HIGH (ref <5.7)
Mean Plasma Glucose: 123 (calc)
eAG (mmol/L): 6.8 (calc)

## 2018-04-15 LAB — LIPID PANEL
Cholesterol: 153 mg/dL (ref <200)
HDL: 45 mg/dL — ABNORMAL LOW (ref >50)
LDL Cholesterol (Calc): 81 mg/dL (calc)
Non-HDL Cholesterol (Calc): 108 mg/dL (calc) (ref <130)
Total CHOL/HDL Ratio: 3.4 (calc) (ref <5.0)
Triglycerides: 168 mg/dL — ABNORMAL HIGH (ref <150)

## 2018-04-19 ENCOUNTER — Encounter: Payer: Self-pay | Admitting: Neurology

## 2018-04-20 ENCOUNTER — Encounter: Payer: Self-pay | Admitting: Nurse Practitioner

## 2018-04-20 ENCOUNTER — Other Ambulatory Visit: Payer: Self-pay

## 2018-04-20 ENCOUNTER — Ambulatory Visit (INDEPENDENT_AMBULATORY_CARE_PROVIDER_SITE_OTHER): Payer: Medicare Other | Admitting: Nurse Practitioner

## 2018-04-20 VITALS — BP 109/60 | HR 73 | Temp 98.0°F | Ht 65.0 in | Wt 198.6 lb

## 2018-04-20 DIAGNOSIS — R809 Proteinuria, unspecified: Secondary | ICD-10-CM

## 2018-04-20 DIAGNOSIS — I10 Essential (primary) hypertension: Secondary | ICD-10-CM

## 2018-04-20 DIAGNOSIS — E1165 Type 2 diabetes mellitus with hyperglycemia: Secondary | ICD-10-CM | POA: Diagnosis not present

## 2018-04-20 MED ORDER — LISINOPRIL 5 MG PO TABS: 5.0000 mg | ORAL_TABLET | Freq: Every day | ORAL | 3 refills | Status: DC

## 2018-04-20 MED ORDER — METFORMIN HCL 500 MG PO TABS: 500.0000 mg | ORAL_TABLET | Freq: Every day | ORAL | 1 refills | Status: DC

## 2018-04-20 MED ORDER — ATORVASTATIN CALCIUM 20 MG PO TABS: 20.0000 mg | ORAL_TABLET | Freq: Every day | ORAL | 3 refills | Status: DC

## 2018-04-20 NOTE — Progress Notes (Signed)
Subjective:    Patient ID: Cassidy Norris, female    DOB: 09-04-1951, 67 y.o.   MRN: [id number]  Cassidy Norris is a 67 y.o. female presenting on 04/20/2018 for Diabetes   HPI Diabetes Pt presents today for follow up of Type 2 diabetes mellitus. She is checking fasting am CBG at home with a range of 126-128 usually.   - Current diabetic medications include: metformin 500 bid - She is not currently symptomatic.  - She denies polydipsia, polyphagia, polyuria, headaches, diaphoresis, shakiness, chills and changes in vision.   - Clinical course has been improving. - She  reports an exercise routine that includes walking on treadmill, 3-4 days per week. - Her diet is moderate in salt, moderate in fat, and moderate in carbohydrates. - Weight trend: decreasing steadily  PREVENTION: Eye exam current (within one year): yes  Dr. Ellin Mayhew (03/08/2018)  Foot exam current (within one year): yes - Podiatry Dr. Amalia Hailey 03/2018 Lipid/ASCVD risk reduction - on statin: no Kidney protection - on ace or arb: yes Recent Labs    10/22/17 0834 01/20/18 1027 04/14/18 0758  HGBA1C 6.2* 6.1 5.9*    Hypertension - She is not checking BP at home or outside of clinic.    - Current medications: carvedilol and lisinopril, tolerating well without side effects - She is not currently symptomatic. - Pt denies headache, lightheadedness, dizziness, changes in vision, chest tightness/pressure, palpitations, leg swelling, sudden loss of speech or loss of consciousness.   Social History   Tobacco Use  . Smoking status: Former Smoker    Packs/day: 1.00    Years: 30.00    Pack years: 30.00    Types: Cigarettes    Last attempt to quit: 12/11/2013    Years since quitting: 4.3  . Smokeless tobacco: Never Used  Substance Use Topics  . Alcohol use: No    Alcohol/week: 0.0 oz    Comment: social  . Drug use: No    Review of Systems Per HPI unless specifically indicated above     Objective:    BP 109/60  (BP Location: Right Arm, Patient Position: Sitting, Cuff Size: Normal)   Pulse 73   Temp 98 F (36.7 C) (Oral)   Ht 5\' 5"  (1.651 m)   Wt 198 lb 9.6 oz (90.1 kg)   LMP 12/22/1999 (Approximate)   BMI 33.05 kg/m   Wt Readings from Last 3 Encounters:  04/20/18 198 lb 9.6 oz (90.1 kg)  01/20/18 200 lb (90.7 kg)  01/18/18 200 lb 6.4 oz (90.9 kg)    Physical Exam  Constitutional: She is oriented to person, place, and time. She appears well-developed and well-nourished. No distress.  Neck: Normal range of motion. Neck supple. Carotid bruit is not present.  Cardiovascular: Normal rate, regular rhythm, S1 normal, S2 normal, normal heart sounds and intact distal pulses.  Pulmonary/Chest: Effort normal and breath sounds normal. No respiratory distress.  Abdominal: Soft. Bowel sounds are normal. She exhibits distension. There is no hepatosplenomegaly. There is no tenderness. No hernia.  Rounded abdomen  Musculoskeletal: She exhibits no edema (pedal).  Neurological: She is alert and oriented to person, place, and time.  Skin: Skin is warm and dry. Capillary refill takes less than 2 seconds.  Psychiatric: She has a normal mood and affect. Her behavior is normal. Judgment and thought content normal.  Vitals reviewed.  Results for orders placed or performed in visit on 04/13/18  COMPLETE METABOLIC PANEL WITH GFR  Result Value Ref Range  Glucose, Bld 95 65 - 99 mg/dL   BUN 20 7 - 25 mg/dL   Creat 0.83 0.50 - 0.99 mg/dL   GFR, Est Non African American 74 > OR = 60 mL/min/1.94m2   GFR, Est African American 86 > OR = 60 mL/min/1.19m2   BUN/Creatinine Ratio NOT APPLICABLE 6 - 22 (calc)   Sodium 141 135 - 146 mmol/L   Potassium 4.3 3.5 - 5.3 mmol/L   Chloride 104 98 - 110 mmol/L   CO2 28 20 - 32 mmol/L   Calcium 9.6 8.6 - 10.4 mg/dL   Total Protein 6.5 6.1 - 8.1 g/dL   Albumin 4.0 3.6 - 5.1 g/dL   Globulin 2.5 1.9 - 3.7 g/dL (calc)   AG Ratio 1.6 1.0 - 2.5 (calc)   Total Bilirubin 0.6 0.2 -  1.2 mg/dL   Alkaline phosphatase (APISO) 42 33 - 130 U/L   AST 23 10 - 35 U/L   ALT 20 6 - 29 U/L  Lipid panel  Result Value Ref Range   Cholesterol 153 <200 mg/dL   HDL 45 (L) >50 mg/dL   Triglycerides 168 (H) <150 mg/dL   LDL Cholesterol (Calc) 81 mg/dL (calc)   Total CHOL/HDL Ratio 3.4 <5.0 (calc)   Non-HDL Cholesterol (Calc) 108 <130 mg/dL (calc)  Hemoglobin A1c  Result Value Ref Range   Hgb A1c MFr Bld 5.9 (H) <5.7 % of total Hgb   Mean Plasma Glucose 123 (calc)   eAG (mmol/L) 6.8 (calc)      Assessment & Plan:   Problem List Items Addressed This Visit      Cardiovascular and Mediastinum   Essential hypertension, benign - Primary    Controlled hypertension.  BP goal < 130/80 and may not require pharmacologic therapy.  Pt is working on lifestyle modifications.  Taking medications tolerating well without side effects. No currently identified complications.  Plan: 1. STOP carvedilol.  Continue lisinopril. Check HR with exercise as this was reason to start carvedilol.  If remains elevated after exercise, may benefit from atenolol or propranolol instead as pt does not currently need BP lowering therapy. 2. Obtain labs at next visit.  Last collection reviewed with pt today.  3. Encouraged heart healthy diet and increasing exercise to 30 minutes most days of the week. 4. Check BP 1-2 x per week at home, keep log, and bring to clinic at next appointment. 5. Follow up 3 months.        Relevant Medications   lisinopril (PRINIVIL,ZESTRIL) 5 MG tablet   atorvastatin (LIPITOR) 20 MG tablet     Endocrine   Type 2 diabetes mellitus with hyperglycemia, without long-term current use of insulin (HCC)    Stable on last A1c check.  Continues to be well controlled.  AM fasting glucose stable.  Patient taking metformin 500 mg bid and tolerating well.  A1c today controlled at 5.9% which is reduced from 6.1% and is less than goal of <7%. - Complicated by hyperlipidemia (pt previously refused  statin) and microalbuminuria (pt now on ACE).  Plan: 1.  Reduce metformin to 500 mg once daily. 2.  Discussed A1c goal and result with pt today.  Discussed microalbumin and lipids with pt today and discussed need for medications to prevent long-term complications.  Pt consents addition of statin.  - START atorvastatin 20 mg once daily for moderate intensity statin.  LDL goal < 70. 3.  Encourage patient to eat low carb diet.  Encouraged continuing exercise 4 times a week at  the gym. 4.  Labs with no improvement in lipid with improved glycemic control.   5.  Follow-up in 3 months      Relevant Medications   lisinopril (PRINIVIL,ZESTRIL) 5 MG tablet   atorvastatin (LIPITOR) 20 MG tablet   metFORMIN (GLUCOPHAGE) 500 MG tablet     Other   Microalbuminuria    Continue lisinopril for kidney protection.  Well tolerated.  Follow up 3 months with DM.      Relevant Medications   lisinopril (PRINIVIL,ZESTRIL) 5 MG tablet      Meds ordered this encounter  Medications  . lisinopril (PRINIVIL,ZESTRIL) 5 MG tablet    Sig: Take 1 tablet (5 mg total) by mouth daily.    Dispense:  90 tablet    Refill:  3  . atorvastatin (LIPITOR) 20 MG tablet    Sig: Take 1 tablet (20 mg total) by mouth daily.    Dispense:  90 tablet    Refill:  3    Order Specific Question:   Supervising Provider    Answer:   [name] [[id number]]  . metFORMIN (GLUCOPHAGE) 500 MG tablet    Sig: Take 1 tablet (500 mg total) by mouth daily with breakfast. For first 2 weeks, just take 1 in the AM and then increase to BID    Dispense:  90 tablet    Refill:  1    Order Specific Question:   Supervising Provider    Answer:   [name] [[id number]]    Follow up plan: Return in about 3 months (around 07/21/2018) for hypertension, diabetes.  [name], DNP, AGPCNP-BC Adult Gerontology Primary Care Nurse Practitioner [facility] 04/22/2018, 12:00 PM

## 2018-04-20 NOTE — Patient Instructions (Addendum)
Cassidy Norris,   Thank you for coming in to clinic today.  1. Exercise HR goals: - During your higher intensity (2.4 miles per hour) - HR goal less than 150 beats per minute - During your lower intensity (1.8 miles per hour) - HR goal is to reduce to less than 115-120 beats per minute - Before you stop and turn off the machine, check HR again.  At that point, HR to reduce to around 100 beats per minute. --> Goal is less than 100 beats per minute within 5 minutes after finishing exercise.  2. STOP carvedilol with next pill box fill.  Blood pressure goal: less than 130/80.    3. START atorvastatin 20 mg once daily for cholesterol.  If your cramps worsen, we may have to stop this medication.    Please schedule a follow-up appointment with [name], AGNP or Dr. [name]. Return in about 3 months (around 07/21/2018) for hypertension, diabetes.  If you have any other questions or concerns, please feel free to call the clinic or send a message through [place]. You may also schedule an earlier appointment if necessary.  You will receive a survey after today's visit either digitally by e-mail or paper by [employer]. Your experiences and feedback matter to [place].  Please respond so we know how we are doing as we provide care for you.   [name], DNP, AGNP-BC Adult Gerontology Nurse Practitioner [facility], [facility]   How to Take a Pulse Your pulse is the increase in pressure inside the blood vessels that carry blood from your heart to the rest of your body (arteries). Every time your heart beats, you can feel your pulse in an artery near the surface of your skin. You can easily feel your pulse in the artery in your wrist (radial artery) and in the artery in your neck (carotid artery). Taking your pulse can tell you how fast your heart is beating and whether it has a normal rhythm. You can also tell whether your heart is beating strongly or weakly. What you need to know  about pulse rates Your pulse is the same as your heart rate. Both are measured in beats per minute (bpm). A normal resting heart rate varies depending on a person's age.  Infants under 1 year of age: Normal heart rate of 100-160 bpm.  Children 57-51 years of age: Normal heart rate of 90-150 bpm.  Children 48-49 years of age: Normal heart rate of 80-140 bpm.  Children 68-60 years of age: Normal heart rate of 70-120 bpm.  Everyone over 52 years of age: Normal heart rate of 60-100 bpm.  There can be a lot of variation in your pulse. It can be different depending on the time of day or the amount of exercise that you get. It changes with your fitness level. Many things can change the speed and regularity of your pulse. These include:  Exercise.  Fever.  Stress.  Heart problems.  Poor circulation.  Medicines.  How to take your pulse To take your pulse, all you need is a digital stopwatch or a clock or watch that has a second hand. The best time to measure your resting pulse is in the morning before you start moving around. Take it as soon as you wake up or after resting for about 10 minutes. There are no firm rules about how often to check your pulse. In general, it is a good idea to check your pulse at least once  a month. Measuring your pulse is a good way to check your heart health. Checking your pulse before and after exercise can tell you if you are getting the right amount of exercise. This is called finding your target heart rate. Your target heart rate depends on your age, fitness, and health. Ask your health care provider what would be a safe target heart rate for you during exercise. Radial Pulse To check the pulse in your radial artery: 1. Turn one hand palm-up and relax your arm. 2. Place the first two fingers of your other hand gently over your wrist, just below the base of your thumb. 3. Place your fingertips just inside the bone that runs along the outside of your arm. 4. Slowly  increase pressure until you feel a pulsing beneath your fingers. You may need to move your fingers slightly. 5. Do not press too hard. Too much pressure may cut off blood supply. 6. Count how many pulse beats you feel in 1 minute. Or, count how many pulse beats you feel in 30 seconds and double that number. 7. Pay attention to the rhythm of the pulse. It should be steady and even.  Carotid Pulse To check the pulse in your carotid artery: 1. Place two fingers just to one side of your Adam's apple so that you feel a pulsing beneath your fingers. 2. Do not press too hard. Too much pressure may cut off blood supply and can make you dizzy. 3. Count how many pulse beats you feel in 1 minute. Or, count how many pulse beats you feel in 30 seconds and double that number. 4. Pay attention to the rhythm of the pulse. It should be steady and even.  Contact a health care provider if:  Your pulse is too slow or too fast.  Your pulse is weak or hard to find.  You have skipped beats or extra beats.  Your pulse has an irregular rhythm.  You have an abnormal pulse along with dizziness, fatigue, or shortness of breath. This information is not intended to replace advice given to you by your health care provider. Make sure you discuss any questions you have with your health care provider. Document Released: 05/16/2003 Document Revised: 05/29/2016 Document Reviewed: 04/14/2016 Elsevier Interactive Patient Education  [employer].

## 2018-04-22 ENCOUNTER — Encounter: Payer: Self-pay | Admitting: Nurse Practitioner

## 2018-04-22 NOTE — Assessment & Plan Note (Signed)
Controlled hypertension.  BP goal < 130/80 and may not require pharmacologic therapy.  Pt is working on lifestyle modifications.  Taking medications tolerating well without side effects. No currently identified complications.  Plan: 1. STOP carvedilol.  Continue lisinopril. Check HR with exercise as this was reason to start carvedilol.  If remains elevated after exercise, may benefit from atenolol or propranolol instead as pt does not currently need BP lowering therapy. 2. Obtain labs at next visit.  Last collection reviewed with pt today.  3. Encouraged heart healthy diet and increasing exercise to 30 minutes most days of the week. 4. Check BP 1-2 x per week at home, keep log, and bring to clinic at next appointment. 5. Follow up 3 months.

## 2018-04-22 NOTE — Assessment & Plan Note (Signed)
Stable on last A1c check.  Continues to be well controlled.  AM fasting glucose stable.  Patient taking metformin 500 mg bid and tolerating well.  A1c today controlled at 5.9% which is reduced from 6.1% and is less than goal of <7%. - Complicated by hyperlipidemia (pt previously refused statin) and microalbuminuria (pt now on ACE).  Plan: 1.  Reduce metformin to 500 mg once daily. 2.  Discussed A1c goal and result with pt today.  Discussed microalbumin and lipids with pt today and discussed need for medications to prevent long-term complications.  Pt consents addition of statin.  - START atorvastatin 20 mg once daily for moderate intensity statin.  LDL goal < 70. 3.  Encourage patient to eat low carb diet.  Encouraged continuing exercise 4 times a week at the gym. 4.  Labs with no improvement in lipid with improved glycemic control.   5.  Follow-up in 3 months

## 2018-04-22 NOTE — Assessment & Plan Note (Signed)
Continue lisinopril for kidney protection.  Well tolerated.  Follow up 3 months with DM.

## 2018-05-04 ENCOUNTER — Encounter: Payer: Self-pay | Admitting: Nurse Practitioner

## 2018-05-09 ENCOUNTER — Other Ambulatory Visit: Payer: Self-pay | Admitting: Nurse Practitioner

## 2018-05-09 DIAGNOSIS — F5101 Primary insomnia: Secondary | ICD-10-CM

## 2018-06-07 ENCOUNTER — Other Ambulatory Visit: Payer: Self-pay | Admitting: Unknown Physician Specialty

## 2018-06-07 ENCOUNTER — Other Ambulatory Visit: Payer: Self-pay | Admitting: Nurse Practitioner

## 2018-06-07 DIAGNOSIS — M5416 Radiculopathy, lumbar region: Secondary | ICD-10-CM

## 2018-06-07 DIAGNOSIS — K219 Gastro-esophageal reflux disease without esophagitis: Secondary | ICD-10-CM

## 2018-06-07 DIAGNOSIS — F5101 Primary insomnia: Secondary | ICD-10-CM

## 2018-06-13 ENCOUNTER — Ambulatory Visit (INDEPENDENT_AMBULATORY_CARE_PROVIDER_SITE_OTHER): Payer: Medicare Other | Admitting: Obstetrics and Gynecology

## 2018-06-13 ENCOUNTER — Encounter: Payer: Self-pay | Admitting: Obstetrics and Gynecology

## 2018-06-13 ENCOUNTER — Other Ambulatory Visit (HOSPITAL_COMMUNITY)
Admission: RE | Admit: 2018-06-13 | Discharge: 2018-06-13 | Disposition: A | Discharge status: Home / Self Care | Payer: Medicare Other | Source: Ambulatory Visit | Attending: Obstetrics and Gynecology | Admitting: Obstetrics and Gynecology

## 2018-06-13 ENCOUNTER — Telehealth: Payer: Self-pay | Admitting: Obstetrics and Gynecology

## 2018-06-13 VITALS — BP 122/74 | HR 105 | Ht 65.0 in | Wt 200.5 lb

## 2018-06-13 DIAGNOSIS — N841 Polyp of cervix uteri: Secondary | ICD-10-CM

## 2018-06-13 DIAGNOSIS — M7989 Other specified soft tissue disorders: Secondary | ICD-10-CM | POA: Diagnosis not present

## 2018-06-13 DIAGNOSIS — N950 Postmenopausal bleeding: Secondary | ICD-10-CM | POA: Insufficient documentation

## 2018-06-13 DIAGNOSIS — N840 Polyp of corpus uteri: Secondary | ICD-10-CM | POA: Diagnosis not present

## 2018-06-13 NOTE — Telephone Encounter (Signed)
[facility] de[facility] is calling about specimens and needing clarification . Please call [phone] christy

## 2018-06-13 NOTE — Telephone Encounter (Signed)
Spoke to [place]. Answered questions.

## 2018-06-13 NOTE — Progress Notes (Signed)
Obstetrics & Gynecology Office Visit   Chief Complaint  Patient presents with  . New Gyn   History of Present Illness: 67 y.o. G42P0010 female who presents for vaginal spotting.  The onset was about 1 month ago.  The spotting is described as light.  She sees spotting on her underwear. She does not have to wipe or wear a pad.  She has not had a workup for this. She had another episode of this about 7 years ago and had a pelvic ultrasound which showed an endometrial stripe of 7 mm.  She denies weight loss.  No alleviating or aggravating factors.  She denies pain.  She notes constipation since November for which she takes colace.  She started taking Tamoxifen in October of last year for her history of breast cancer.  Her last pap smear was in November 2018 and it was normal.   Past Medical History:  Diagnosis Date  . Anemia   . B12 deficiency 07/23/2016   Will check levels to determine if injections are needed again. Await results. Recent CBC at Onc WNL  . Cancer [facility]) 2014   right breast - Invasive Mammary Carcinoma  . Cancer [facility]) 2014   left breast - Invasive lobular carcinoma and DCIS   . Diabetes mellitus without complication ([place])   . Diffuse cystic mastopathy   . Essential hypertension, benign 09/24/2017  . Family history of malignant neoplasm of gastrointestinal tract 2012  . Fractured elbow 08/28/14  . Hx of metabolic acidosis with increased anion gap   . Increased heart rate   . Multiple sclerosis ([place])   . Neuromuscular disorder ([place])    neuropathy in bil feet - left is worse.  . Obesity, unspecified   . Peripheral neuropathy   . Personal history of tobacco use, presenting hazards to health   . Restless leg syndrome   . Sleep apnea   . Special screening for malignant neoplasms, colon     Past Surgical History:  Procedure Laterality Date  . BREAST BIOPSY Right 1973,2001  . BREAST BIOPSY Right 11-07-13   INVASIVE MAMMARY CARCINOMA , ER/PR positive Her2 negative  . BREAST  BIOPSY Left 11-27-13   invasive lobular and DCIS  . BREAST SURGERY Bilateral 01/09/14   mastectomy  . CHOLECYSTECTOMY    . COLONOSCOPY  2010   Dr. Jamal Collin  . COLONOSCOPY WITH PROPOFOL N/A 06/11/2015   Procedure: COLONOSCOPY WITH PROPOFOL;  Surgeon: Christene Lye, MD;  Location: [facility] ENDOSCOPY;  Service: Endoscopy;  Laterality: N/A;  . DILATION AND CURETTAGE OF UTERUS  1983  . POLYPECTOMY  1989  . PORTACATH PLACEMENT  2015  . SPINE SURGERY  09/14/14   Ruptured disk L4- L5  . TOTAL KNEE ARTHROPLASTY Left 11/22/2015   Procedure: TOTAL KNEE ARTHROPLASTY;  Surgeon: Earlie Server, MD;  Location: [facility];  Service: Orthopedics;  Laterality: Left;  . TUBAL LIGATION    . WISDOM TOOTH EXTRACTION  2006    Gynecologic History: Patient's last menstrual period was 12/22/1999 (approximate).  Obstetric History: G1P0010  Family History  Problem Relation Age of Onset  . Cancer Mother        lung  . Cancer Father        colon, stomach  . Diabetes Maternal Grandmother   . Hypertension Sister   . Rectal cancer Paternal Uncle   . Rectal cancer Paternal Uncle     Social History   Socioeconomic History  . Marital status: Widowed    Spouse name: Not on file  .  Number of children: Not on file  . Years of education: Not on file  . Highest education level: Not on file  Occupational History  . Not on file  Social Needs  . Financial resource strain: Not on file  . Food insecurity:    Worry: Not on file    Inability: Not on file  . Transportation needs:    Medical: Not on file    Non-medical: Not on file  Tobacco Use  . Smoking status: Former Smoker    Packs/day: 1.00    Years: 30.00    Pack years: 30.00    Types: Cigarettes    Last attempt to quit: 12/11/2013    Years since quitting: 4.5  . Smokeless tobacco: Never Used  Substance and Sexual Activity  . Alcohol use: No    Alcohol/week: 0.0 oz    Comment: social  . Drug use: No  . Sexual activity: Never  Lifestyle  . Physical  activity:    Days per week: Not on file    Minutes per session: Not on file  . Stress: Not on file  Relationships  . Social connections:    Talks on phone: Not on file    Gets together: Not on file    Attends religious service: Not on file    Active member of club or organization: Not on file    Attends meetings of clubs or organizations: Not on file    Relationship status: Not on file  . Intimate partner violence:    Fear of current or ex partner: Not on file    Emotionally abused: Not on file    Physically abused: Not on file    Forced sexual activity: Not on file  Other Topics Concern  . Not on file  Social History Narrative  . Not on file   Allergies: No Known Allergies  Prior to Admission medications   Medication Sig Start Date End Date Taking? Authorizing Provider  acetaminophen (TYLENOL) 325 MG tablet Take 650 mg by mouth every 6 (six) hours as needed.   Yes [provider]  atorvastatin (LIPITOR) 20 MG tablet Take 1 tablet (20 mg total) by mouth daily. 04/20/18  Yes Mikey College, NP  BAYER CONTOUR NEXT TEST test strip 1 each by Other route daily.  01/19/17  Yes [provider]  Calcium Carbonate (CALCIUM 600 PO) Take 1 tablet by mouth 2 (two) times daily.   Yes [provider]  Cholecalciferol (VITAMIN D) 2000 UNITS tablet Take 6,000 Units by mouth daily.    Yes [provider]  Cyanocobalamin (VITAMIN B-12) 5000 MCG TBDP Take 5,000 mcg by mouth daily.   Yes [provider]  Dimethyl Fumarate (TECFIDERA) 120 & 240 MG MISC Take 1 capsule by mouth as directed. One 120 mg cap BID for 7 days, then one 240 mg cap BID thereafter 02/01/18  Yes Jaffe, Adam R, DO  docusate sodium (COLACE) 250 MG capsule Take 1 capsule (250 mg total) by mouth daily. 01/20/18  Yes Mikey College, NP  famotidine (PEPCID) 20 MG tablet Take 1 tablet (20 mg total) by mouth daily. 01/20/18  Yes Mikey College, NP  fluticasone [facility]) 50  MCG/ACT nasal spray Place 2 sprays into both nostrils at bedtime. 01/20/18  Yes Mikey College, NP  lisinopril (PRINIVIL,ZESTRIL) 5 MG tablet Take 1 tablet (5 mg total) by mouth daily. 04/20/18  Yes Mikey College, NP  meloxicam (MOBIC) 7.5 MG tablet TAKE 1 TABLET EVERY DAY  06/07/18  Yes Mikey College, NP  metFORMIN (GLUCOPHAGE) 500 MG tablet Take 1 tablet (500 mg total) by mouth daily with breakfast. For first 2 weeks, just take 1 in the AM and then increase to BID 04/20/18  Yes Mikey College, NP  MICROLET LANCETS [place]  01/14/17  Yes [provider]  Omega-3 Fatty Acids (FISH OIL PO) Take by mouth.   Yes [provider]  polyethylene glycol powder (GLYCOLAX/MIRALAX) powder Take 17 g by mouth 2 (two) times daily as needed for moderate constipation. 01/20/18  Yes Mikey College, NP  tamoxifen (NOLVADEX) 20 MG tablet TAKE 1 TABLET (20 MG TOTAL) BY MOUTH DAILY. 02/21/18  Yes Lloyd Huger, MD  traZODone (DESYREL) 50 MG tablet TAKE 2 TABLETS AT BEDTIME 06/07/18  Yes Mikey College, NP    Review of Systems  Constitutional: Negative.   HENT: Negative.   Eyes: Negative.   Respiratory: Negative.   Cardiovascular: Negative.   Gastrointestinal: Positive for constipation. Negative for abdominal pain, blood in stool, diarrhea, heartburn, melena, nausea and vomiting.  Genitourinary: Negative.   Musculoskeletal: Negative.   Skin: Negative.   Neurological: Negative.   Psychiatric/Behavioral: Negative.      Physical Exam BP 122/74 (BP Location: Left Arm, Patient Position: Sitting, Cuff Size: Large)   Pulse (!) 105   Ht 5' 5"  (1.651 m)   Wt 200 lb 8 oz (90.9 kg)   LMP 12/22/1999 (Approximate)   SpO2 97%   BMI 33.36 kg/m  Patient's last menstrual period was 12/22/1999 (approximate). Physical Exam  Constitutional: She is oriented to person, place, and time. She appears well-developed and well-nourished. No distress.  Genitourinary: Vagina  normal and uterus normal. Pelvic exam was performed with patient supine. There is no rash, tenderness or lesion on the right labia. There is no rash, tenderness or lesion on the left labia. Right adnexum does not display mass, does not display tenderness and does not display fullness. Left adnexum does not display mass, does not display tenderness and does not display fullness.  Cervix exhibits polyp. Cervix does not exhibit motion tenderness, nabothian cyst or pinkness.  Genitourinary Comments: Bimanual exam limited by patient's body habitus  HENT:  Head: Normocephalic and atraumatic.  Eyes: Conjunctivae are normal. No scleral icterus.  Neck: Normal range of motion. Neck supple.  Cardiovascular: Normal rate and regular rhythm. Exam reveals no gallop and no friction rub.  No murmur heard. Pulmonary/Chest: Effort normal and breath sounds normal. No stridor. No respiratory distress. She has no wheezes. She has no rales.  Abdominal: Soft. Bowel sounds are normal. She exhibits no distension and no mass. There is no tenderness. There is no rebound and no guarding.  Musculoskeletal: Normal range of motion. She exhibits no edema.  Neurological: She is alert and oriented to person, place, and time. No cranial nerve deficit.  Skin: Skin is warm and dry. No erythema.  Psychiatric: She has a normal mood and affect. Her behavior is normal. Judgment normal.   Endocervical polypectomy: Cervix visualized and polyp noted.  Ring forcep applied to cervix and twisting motion removed polyp intact.  Hemostasis obtained.  Endometrial Biopsy After discussion with the patient regarding her abnormal uterine bleeding I recommended that she proceed with an endometrial biopsy for further diagnosis. The risks, benefits, alternatives, and indications for an endometrial biopsy were discussed with the patient in detail. She understood the risks including infection, bleeding, cervical laceration and uterine perforation.  Verbal  consent was obtained.   PROCEDURE NOTE:  [place] endometrial biopsy was performed using aseptic technique with iodine preparation.  The uterus was sounded to a length of 7 cm.  Adequate sampling was obtained with minimal blood loss.  The patient tolerated the procedure well.  Disposition will be pending pathology.   Female chaperone present for pelvic and breast  portions of the physical exam  Assessment: 67 y.o. G20P0010 female here for  1. Postmenopausal bleeding   2. Cervical polyp      Plan: Problem List Items Addressed This Visit    None    Visit Diagnoses    Postmenopausal bleeding    -  Primary   Relevant Orders   US PELVIS TRANSVANGINAL NON-OB (TV ONLY)   Surgical pathology   Surgical pathology   Cervical polyp       Relevant Orders   Surgical pathology     Patient had small endocervical polyp and very little return on her endometrial biopsy. Will send both for pathology and obtain pelvic ultrasound. Reviewed potential etiologies for postmenopausal bleeding. She is taking tamoxifen and this has only a slight increase in risk for uterine cancer.  Will assess endometrial stripe on ultrasound and surrounding structures.  This is a new problem for the patient with potential for serious underlying etiology (I.e. Cancer).  Discussed importance of following up on investigation. She voiced understanding and agreement.   Prentice Docker, MD 06/13/2018 11:29 AM

## 2018-06-17 ENCOUNTER — Encounter: Payer: Self-pay | Admitting: Obstetrics and Gynecology

## 2018-06-27 ENCOUNTER — Ambulatory Visit (INDEPENDENT_AMBULATORY_CARE_PROVIDER_SITE_OTHER): Payer: Medicare Other

## 2018-06-27 ENCOUNTER — Ambulatory Visit: Payer: Medicare Other | Admitting: Neurology

## 2018-06-27 ENCOUNTER — Ambulatory Visit (INDEPENDENT_AMBULATORY_CARE_PROVIDER_SITE_OTHER): Payer: Medicare Other | Admitting: Obstetrics and Gynecology

## 2018-06-27 ENCOUNTER — Encounter: Payer: Self-pay | Admitting: Obstetrics and Gynecology

## 2018-06-27 DIAGNOSIS — N840 Polyp of corpus uteri: Secondary | ICD-10-CM | POA: Diagnosis not present

## 2018-06-27 DIAGNOSIS — N950 Postmenopausal bleeding: Secondary | ICD-10-CM

## 2018-06-27 DIAGNOSIS — D250 Submucous leiomyoma of uterus: Secondary | ICD-10-CM

## 2018-06-27 DIAGNOSIS — D251 Intramural leiomyoma of uterus: Secondary | ICD-10-CM

## 2018-06-27 NOTE — Progress Notes (Signed)
Gynecology Ultrasound Follow Up   Chief Complaint  Patient presents with  . Follow-up  postmenopausal bleeding  History of Present Illness: Patient is a 66 y.o. female who presents today for ultrasound evaluation of the above .  Ultrasound demonstrates the following findings Adnexa:  Left ovary with 3 x 2 x 2.5 cm hypoechoic area Uterus: anteverted with endometrial stripe  8.9 mm Additional: possible fibroid, intramural versus submucosal that measures 1.6 x 1.3 cm,  Possible fibroid versus other etiology in lower uterine segment measuring 1.2 x 0.94 cm.   Pathology from biopsy at last visit was endometrial polyp with endometrial biopsy returning with no cells.   Past Medical History:  Diagnosis Date  . Anemia   . B12 deficiency 07/23/2016   Will check levels to determine if injections are needed again. Await results. Recent CBC at Onc WNL  . Cancer [facility]) 2014   right breast - Invasive Mammary Carcinoma  . Cancer [facility]) 2014   left breast - Invasive lobular carcinoma and DCIS   . Diabetes mellitus without complication ([place])   . Diffuse cystic mastopathy   . Essential hypertension, benign 09/24/2017  . Family history of malignant neoplasm of gastrointestinal tract 2012  . Fractured elbow 08/28/14  . Hx of metabolic acidosis with increased anion gap   . Increased heart rate   . Multiple sclerosis ([place])   . Neuromuscular disorder ([place])    neuropathy in bil feet - left is worse.  . Obesity, unspecified   . Peripheral neuropathy   . Personal history of tobacco use, presenting hazards to health   . Restless leg syndrome   . Sleep apnea   . Special screening for malignant neoplasms, colon     Past Surgical History:  Procedure Laterality Date  . BREAST BIOPSY Right 1973,2001  . BREAST BIOPSY Right 11-07-13   INVASIVE MAMMARY CARCINOMA , ER/PR positive Her2 negative  . BREAST BIOPSY Left 11-27-13   invasive lobular and DCIS  . BREAST SURGERY Bilateral 01/09/14   mastectomy  .  CHOLECYSTECTOMY    . COLONOSCOPY  2010   Dr. Jamal Norris  . COLONOSCOPY WITH PROPOFOL N/A 06/11/2015   Procedure: COLONOSCOPY WITH PROPOFOL;  Surgeon: Cassidy Lye, MD;  Location: [facility] ENDOSCOPY;  Service: Endoscopy;  Laterality: N/A;  . DILATION AND CURETTAGE OF UTERUS  1983  . POLYPECTOMY  1989  . PORTACATH PLACEMENT  2015  . SPINE SURGERY  09/14/14   Ruptured disk L4- L5  . TOTAL KNEE ARTHROPLASTY Left 11/22/2015   Procedure: TOTAL KNEE ARTHROPLASTY;  Surgeon: Cassidy Server, MD;  Location: [facility];  Service: Orthopedics;  Laterality: Left;  . TUBAL LIGATION    . WISDOM TOOTH EXTRACTION  2006   Family History  Problem Relation Age of Onset  . Cancer Mother        lung  . Cancer Father        colon, stomach  . Diabetes Maternal Grandmother   . Hypertension Sister   . Rectal cancer Paternal Uncle   . Rectal cancer Paternal Uncle     Social History   Socioeconomic History  . Marital status: Widowed    Spouse name: Not on file  . Number of children: Not on file  . Years of education: Not on file  . Highest education level: Not on file  Occupational History  . Not on file  Social Needs  . Financial resource strain: Not on file  . Food insecurity:    Worry: Not on file  Inability: Not on file  . Transportation needs:    Medical: Not on file    Non-medical: Not on file  Tobacco Use  . Smoking status: Former Smoker    Packs/day: 1.00    Years: 30.00    Pack years: 30.00    Types: Cigarettes    Last attempt to quit: 12/11/2013    Years since quitting: 4.5  . Smokeless tobacco: Never Used  Substance and Sexual Activity  . Alcohol use: No    Alcohol/week: 0.0 oz    Comment: social  . Drug use: No  . Sexual activity: Never  Lifestyle  . Physical activity:    Days per week: Not on file    Minutes per session: Not on file  . Stress: Not on file  Relationships  . Social connections:    Talks on phone: Not on file    Gets together: Not on file    Attends  religious service: Not on file    Active member of club or organization: Not on file    Attends meetings of clubs or organizations: Not on file    Relationship status: Not on file  . Intimate partner violence:    Fear of current or ex partner: Not on file    Emotionally abused: Not on file    Physically abused: Not on file    Forced sexual activity: Not on file  Other Topics Concern  . Not on file  Social History Narrative  . Not on file   Allergies: No Known Allergies  Prior to Admission medications   Medication Sig Start Date End Date Taking? Authorizing Provider  acetaminophen (TYLENOL) 325 MG tablet Take 650 mg by mouth every 6 (six) hours as needed.   Yes [provider]  atorvastatin (LIPITOR) 20 MG tablet Take 1 tablet (20 mg total) by mouth daily. 04/20/18  Yes Cassidy College, NP  BAYER CONTOUR NEXT TEST test strip 1 each by Other route daily.  01/19/17  Yes [provider]  Calcium Carbonate (CALCIUM 600 PO) Take 1 tablet by mouth 2 (two) times daily.   Yes [provider]  carvedilol (COREG) 3.125 MG tablet TAKE 1 TABLET (3.125 MG TOTAL) BY MOUTH 2 (TWO) TIMES DAILY WITH A MEAL. 02/21/18  Yes Cassidy Haddock, NP  Cholecalciferol (VITAMIN D) 2000 UNITS tablet Take 6,000 Units by mouth daily.    Yes [provider]  Cyanocobalamin (VITAMIN B-12) 5000 MCG TBDP Take 5,000 mcg by mouth daily.   Yes [provider]  Dimethyl Fumarate (TECFIDERA) 120 & 240 MG MISC Take 1 capsule by mouth as directed. One 120 mg cap BID for 7 days, then one 240 mg cap BID thereafter 02/01/18  Yes Cassidy Norris, Cassidy R, DO  docusate sodium (COLACE) 250 MG capsule Take 1 capsule (250 mg total) by mouth daily. 01/20/18  Yes Cassidy College, NP  famotidine (PEPCID) 20 MG tablet Take 1 tablet (20 mg total) by mouth daily. 01/20/18  Yes Cassidy College, NP  fluticasone [facility]) 50 MCG/ACT nasal spray Place 2 sprays into both nostrils at bedtime. 01/20/18  Yes  Cassidy College, NP  lisinopril (PRINIVIL,ZESTRIL) 5 MG tablet Take 1 tablet (5 mg total) by mouth daily. 04/20/18  Yes Cassidy College, NP  meloxicam (MOBIC) 7.5 MG tablet TAKE 1 TABLET EVERY DAY 06/07/18  Yes Cassidy College, NP  metFORMIN (GLUCOPHAGE) 500 MG tablet Take 1 tablet (500 mg total) by mouth daily with breakfast. For first 2 weeks,  just take 1 in the AM and then increase to BID 04/20/18  Yes Cassidy College, NP  MICROLET LANCETS [place]  01/14/17  Yes [provider]  Omega-3 Fatty Acids (FISH OIL PO) Take by mouth.   Yes [provider]  polyethylene glycol powder (GLYCOLAX/MIRALAX) powder Take 17 g by mouth 2 (two) times daily as needed for moderate constipation. 01/20/18  Yes Cassidy College, NP  tamoxifen (NOLVADEX) 20 MG tablet TAKE 1 TABLET (20 MG TOTAL) BY MOUTH DAILY. 02/21/18  Yes Lloyd Huger, MD  traZODone (DESYREL) 50 MG tablet TAKE 2 TABLETS AT BEDTIME 06/07/18  Yes Cassidy College, NP    Physical Exam BP 126/84 (BP Location: Left Arm, Patient Position: Sitting, Cuff Size: Large)   Pulse 98   Ht 5' 5"  (1.651 m)   Wt 202 lb (91.6 kg)   LMP 12/22/1999 (Approximate)   SpO2 99%   BMI 33.61 kg/m    General: NAD HEENT: normocephalic, anicteric Pulmonary: No increased work of breathing Extremities: no edema, erythema, or tenderness Neurologic: Grossly intact, normal gait Psychiatric: mood appropriate, affect full  US Pelvis Transvanginal Non-ob (tv Only)  Result Date: 06/27/2018 ULTRASOUND REPORT Location: [facility] Date of Service: 06/27/2018 Patient Name: Cassidy Norris DOB: 1951/05/31 MRN: [id number] Indications: Postmenopausal Bleeding Findings: The uterus is anteverted and measures 6.84 x 4.98 x 3.84 CM. Echo texture is heterogenous with evidence of focal masses. Within the uterus are multiple suspected fibroids measuring: Fibroid 1: Intramural vs submucosal measures 16.24 x 13.02 mm Fibroid 2: Possible  fibroid vs other etiology in lower uterine segment measures 12.01 x 9.39 mm The Endometrium measures 8.85 mm. Right Ovary measures 2.22 x 1.85 x 1.39 cm. It is normal in appearance. Left Ovary measures 3.59 x 3.04 x 2.38 cm. It contains slight vascular hypoechoic area with border possible endometrioma vs other etiology measures 30.1 x 20.2 x 25.4 mm Survey of the adnexa demonstrates no adnexal masses. There is no free fluid in the cul de sac. Impression: 1. Fibroids are seen, as above. 2.Right ovary slight vascular hypoechoic area with border possible endometrioma vs other etiology measures 30.1 x 20.2 x 25.4 mm Recommendations: 1.Clinical correlation with the patient's History and Physical Exam. Mital bahen Marlowe Sax, RDMS The ultrasound images and findings were reviewed by me and I agree with the above report. Prentice Docker, MD, [facility], [facility] 06/27/2018 11:13 AM    Assessment: 67 y.o. G1P0010  1. Endometrial polyp   2. Postmenopausal bleeding      Plan: Problem List Items Addressed This Visit      Genitourinary   Endometrial polyp     Other   Postmenopausal bleeding     Discussed findings on pathology.  Ultrasound today shows no specific findings related to endometrial polyp. However, given her bleeding and her risk factors for cancer, as well as findings in ultrasound, recommendation made for hysteroscopy, dilation and curettage, and endometrial polypectomy. Discussed that I could have removed the entire polyp with my biopsy. However, I did not sample her uterine lining based on the results of the endometrial biopsy pathology report.  Mutual decision made to proceed with the above surgery.    20 minutes spent in face to face discussion with > 50% spent in counseling,management, and coordination of care of her endometrial polyp and postmenopausal bleeding.  Prentice Docker, MD, [facility], [facility] 06/27/2018 5:22 PM

## 2018-06-28 ENCOUNTER — Telehealth: Payer: Self-pay | Admitting: Obstetrics and Gynecology

## 2018-06-28 NOTE — Telephone Encounter (Signed)
Patient is aware of H&P at [facility] on 07/08/18 @ 8:10am, Pre-admit Testing afterwards, and OR on 07/12/18. Patient is aware she may receive calls from the [facility]. Patient confirmed Medicaid and [place]. Patient understands Medicare does not review for authorization prior to surgery. Ext given.

## 2018-06-28 NOTE — Telephone Encounter (Signed)
-----   Message from Will Bonnet, MD sent at 06/27/2018  5:23 PM EDT ----- Regarding: Schedule surgery Surgery Booking Request Patient Full Name:  Cassidy Norris  MRN: [id number]  DOB: 04-Sep-1951  Surgeon: Prentice Docker, MD  Requested Surgery Date and Time: TBD Primary Diagnosis AND Code: postmenopausal bleeding, endometrial polyp Secondary Diagnosis and Code:  Surgical Procedure: hysteroscopy, dilation and curettage, endometrial polypectomy L&D Notification: No Admission Status: same day surgery Length of Surgery: 30 minutes Special Case Needs: myosure hysteroscope H&P: TBD (date) Phone Interview???: no Interpreter: Language:  Medical Clearance: no Special Scheduling Instructions: none

## 2018-06-29 ENCOUNTER — Telehealth: Payer: Self-pay | Admitting: Obstetrics and Gynecology

## 2018-06-29 NOTE — Telephone Encounter (Signed)
I contacted the patient to let her know the secondary [employer] shows the Paid to Date as 06/23/2018. Patient said her policy is paid electronically on the 15th of the month, and the July 15th payment processed. She will give them a call to follow-up and make sure she doesn't have any coverage issues when the surgery is billed.

## 2018-07-08 ENCOUNTER — Encounter
Admission: RE | Admit: 2018-07-08 | Discharge: 2018-07-08 | Disposition: A | Discharge status: Home / Self Care | Payer: Medicare Other | Source: Ambulatory Visit | Attending: Obstetrics and Gynecology | Admitting: Obstetrics and Gynecology

## 2018-07-08 ENCOUNTER — Encounter: Payer: Self-pay | Admitting: Obstetrics and Gynecology

## 2018-07-08 ENCOUNTER — Other Ambulatory Visit: Payer: Self-pay

## 2018-07-08 ENCOUNTER — Ambulatory Visit (INDEPENDENT_AMBULATORY_CARE_PROVIDER_SITE_OTHER): Payer: Medicare Other | Admitting: Obstetrics and Gynecology

## 2018-07-08 VITALS — BP 110/62 | HR 80 | Ht 65.0 in | Wt 201.0 lb

## 2018-07-08 DIAGNOSIS — I10 Essential (primary) hypertension: Secondary | ICD-10-CM | POA: Insufficient documentation

## 2018-07-08 DIAGNOSIS — Z01810 Encounter for preprocedural cardiovascular examination: Secondary | ICD-10-CM | POA: Diagnosis not present

## 2018-07-08 DIAGNOSIS — Z01812 Encounter for preprocedural laboratory examination: Secondary | ICD-10-CM | POA: Insufficient documentation

## 2018-07-08 DIAGNOSIS — N950 Postmenopausal bleeding: Secondary | ICD-10-CM | POA: Diagnosis not present

## 2018-07-08 DIAGNOSIS — I498 Other specified cardiac arrhythmias: Secondary | ICD-10-CM | POA: Insufficient documentation

## 2018-07-08 DIAGNOSIS — N840 Polyp of corpus uteri: Secondary | ICD-10-CM | POA: Diagnosis not present

## 2018-07-08 LAB — CBC
HCT: 37.5 % (ref 35.0–47.0)
HEMOGLOBIN: 13 g/dL (ref 12.0–16.0)
MCH: 33 pg (ref 26.0–34.0)
MCHC: 34.6 g/dL (ref 32.0–36.0)
MCV: 95.5 fL (ref 80.0–100.0)
Platelets: 158 10*3/uL (ref 150–440)
RBC: 3.92 MIL/uL (ref 3.80–5.20)
RDW: 12.5 % (ref 11.5–14.5)
WBC: 6.1 10*3/uL (ref 3.6–11.0)

## 2018-07-08 LAB — COMPREHENSIVE METABOLIC PANEL
ALT: 29 U/L (ref 0–44)
AST: 32 U/L (ref 15–41)
Albumin: 4.4 g/dL (ref 3.5–5.0)
Alkaline Phosphatase: 46 U/L (ref 38–126)
Anion gap: 10 (ref 5–15)
BUN: 17 mg/dL (ref 8–23)
CHLORIDE: 103 mmol/L (ref 98–111)
CO2: 27 mmol/L (ref 22–32)
Calcium: 9.8 mg/dL (ref 8.9–10.3)
Creatinine, Ser: 0.82 mg/dL (ref 0.44–1.00)
Glucose, Bld: 117 mg/dL — ABNORMAL HIGH (ref 70–99)
Potassium: 4.1 mmol/L (ref 3.5–5.1)
Sodium: 140 mmol/L (ref 135–145)
Total Bilirubin: 0.8 mg/dL (ref 0.3–1.2)
Total Protein: 7.3 g/dL (ref 6.5–8.1)

## 2018-07-08 NOTE — H&P (View-Only) (Signed)
Preoperative History and Physical  Cassidy Norris is a 67 y.o. G1P0010 here for surgical management of postmenopausal bleeding.   No significant preoperative concerns.  History of Present Illness: 67 y.o. G30P0010 female who originally presented for vaginal spotting.  The onset was about 2 months ago.  The spotting is described as light.  She sees spotting on her underwear. She does not have to wipe or wear a pad.  She had no prior workup for this. She had another episode of this about 7 years ago and had a pelvic ultrasound which showed an endometrial stripe of 7 mm.  She denies weight loss.  No alleviating or aggravating factors.  She denies pain.  She notes constipation since November for which she takes colace.  She started taking Tamoxifen in October of last year for her history of breast cancer.  Her last pap smear was in November 2018 and it was normal.   She had a pelvic ultrasound which showed: Adnexa:  Left ovary with 3 x 2 x 2.5 cm hypoechoic area Uterus: anteverted with endometrial stripe  8.9 mm Additional: possible fibroid, intramural versus submucosal that measures 1.6 x 1.3 cm,  Possible fibroid versus other etiology in lower uterine segment measuring 1.2 x 0.94 cm.   Pathology from biopsy at last visit was endometrial polyp with endometrial biopsy returning with no cells.    Proposed surgery: hysteroscopy, dilation and curettage, polypectomy  Past Medical History:  Diagnosis Date  . Anemia   . B12 deficiency 07/23/2016   Will check levels to determine if injections are needed again. Await results. Recent CBC at Onc WNL  . Cancer [facility]) 2014   right breast - Invasive Mammary Carcinoma  . Cancer [facility]) 2014   left breast - Invasive lobular carcinoma and DCIS   . Diabetes mellitus without complication ([place])   . Diffuse cystic mastopathy   . Essential hypertension, benign 09/24/2017  . Family history of malignant neoplasm of gastrointestinal tract 2012  . Fractured elbow  08/28/14  . Hx of metabolic acidosis with increased anion gap   . Increased heart rate   . Multiple sclerosis ([place])   . Neuromuscular disorder ([place])    neuropathy in bil feet - left is worse.  . Obesity, unspecified   . Peripheral neuropathy   . Personal history of tobacco use, presenting hazards to health   . Restless leg syndrome   . Sleep apnea   . Special screening for malignant neoplasms, colon    Past Surgical History:  Procedure Laterality Date  . BREAST BIOPSY Right 1973,2001  . BREAST BIOPSY Right 11-07-13   INVASIVE MAMMARY CARCINOMA , ER/PR positive Her2 negative  . BREAST BIOPSY Left 11-27-13   invasive lobular and DCIS  . BREAST SURGERY Bilateral 01/09/14   mastectomy  . CHOLECYSTECTOMY    . COLONOSCOPY  2010   Dr. Jamal Collin  . COLONOSCOPY WITH PROPOFOL N/A 06/11/2015   Procedure: COLONOSCOPY WITH PROPOFOL;  Surgeon: Christene Lye, MD;  Location: [facility] ENDOSCOPY;  Service: Endoscopy;  Laterality: N/A;  . DILATION AND CURETTAGE OF UTERUS  1983  . POLYPECTOMY  1989  . PORTACATH PLACEMENT  2015  . SPINE SURGERY  09/14/14   Ruptured disk L4- L5  . TOTAL KNEE ARTHROPLASTY Left 11/22/2015   Procedure: TOTAL KNEE ARTHROPLASTY;  Surgeon: Earlie Server, MD;  Location: [facility];  Service: Orthopedics;  Laterality: Left;  . TUBAL LIGATION    . [facility] EXTRACTION  2006   OB History  Gravida Para Term Preterm AB Living  1       1    SAB TAB Ectopic Multiple Live Births  1            # Outcome Date GA Lbr Len/2nd Weight Sex Delivery Anes PTL Lv  1 SAB             Obstetric Comments  Age with first menstruation-13  Age with first pregnancy-31  Patient denies any other pertinent gynecologic issues.   Current Outpatient Medications on File Prior to Visit  Medication Sig Dispense Refill  . acetaminophen (TYLENOL) 500 MG tablet Take 1,000 mg by mouth 2 (two) times daily as needed for moderate pain or headache.    Marland Kitchen atorvastatin (LIPITOR) 20 MG tablet Take 1 tablet  (20 mg total) by mouth daily. (Patient taking differently: Take 20 mg by mouth at bedtime. ) 90 tablet 3  . BAYER CONTOUR NEXT TEST test strip 1 each by Other route daily.   3  . Calcium Carbonate-Vitamin D (CALCIUM 600+D PO) Take 1 tablet by mouth 2 (two) times daily.    . cholecalciferol (VITAMIN D) 1000 units tablet Take 6,000 Units by mouth daily.    . Cyanocobalamin (B-12) 5000 MCG SUBL Place 5,000 mcg under the tongue daily.    . Dimethyl Fumarate (TECFIDERA) 120 & 240 MG MISC Take 1 capsule by mouth as directed. One 120 mg cap BID for 7 days, then one 240 mg cap BID thereafter (Patient taking differently: Take 240 mg by mouth 2 (two) times daily. ) 60 each 3  . docusate sodium (COLACE) 100 MG capsule Take 100 mg by mouth at bedtime.    . famotidine (PEPCID) 20 MG tablet Take 1 tablet (20 mg total) by mouth daily. 90 tablet 1  . fluticasone (FLONASE) 50 MCG/ACT nasal spray Place 2 sprays into both nostrils at bedtime. 16 g 12  . lisinopril (PRINIVIL,ZESTRIL) 5 MG tablet Take 1 tablet (5 mg total) by mouth daily. (Patient taking differently: Take 5 mg by mouth every evening. ) 90 tablet 3  . meloxicam (MOBIC) 7.5 MG tablet TAKE 1 TABLET EVERY DAY (Patient taking differently: Take 7.5 mg by mouth daily. ) 90 tablet 1  . metFORMIN (GLUCOPHAGE) 500 MG tablet Take 1 tablet (500 mg total) by mouth daily with breakfast. For first 2 weeks, just take 1 in the AM and then increase to BID (Patient taking differently: Take 500 mg by mouth daily with breakfast. ) 90 tablet 1  . MICROLET LANCETS MISC   3  . Omega-3 Fatty Acids (FISH OIL) 1000 MG CAPS Take 1,000 mg by mouth at bedtime.    . tamoxifen (NOLVADEX) 20 MG tablet TAKE 1 TABLET (20 MG TOTAL) BY MOUTH DAILY. (Patient taking differently: Take 20 mg by mouth daily. ) 90 tablet 1  . traZODone (DESYREL) 50 MG tablet TAKE 2 TABLETS AT BEDTIME (Patient taking differently: Take 100 mg by mouth at bedtime. ) 180 tablet 1   No current facility-administered  medications on file prior to visit.    No Known Allergies  Social History:   reports that she quit smoking about 4 years ago. Her smoking use included cigarettes. She has a 30.00 pack-year smoking history. She has never used smokeless tobacco. She reports that she does not drink alcohol or use drugs.  Family History  Problem Relation Age of Onset  . Cancer Mother        lung  . Cancer Father  colon, stomach  . Diabetes Maternal Grandmother   . Hypertension Sister   . Rectal cancer Paternal Uncle   . Rectal cancer Paternal Uncle     Review of Systems: Noncontributory  PHYSICAL EXAM: Blood pressure 110/62, pulse 80, height _0  (1.651 m), weight 201 lb (91.2 kg), last menstrual period 12/22/1999, SpO2 95 %. CONSTITUTIONAL: Well-developed, well-nourished female in no acute distress.  HENT:  Normocephalic, atraumatic, External right and left ear normal. Oropharynx is clear and moist EYES: Conjunctivae and EOM are normal. Pupils are equal, round, and reactive to light. No scleral icterus.  NECK: Normal range of motion, supple, no masses SKIN: Skin is warm and dry. No rash noted. Not diaphoretic. No erythema. No pallor. [place]: Alert and oriented to person, place, and time. Normal reflexes, muscle tone coordination. No cranial nerve deficit noted. PSYCHIATRIC: Normal mood and affect. Normal behavior. Normal judgment and thought content. CARDIOVASCULAR: Normal heart rate noted, regular rhythm RESPIRATORY: Effort and breath sounds normal, no problems with respiration noted ABDOMEN: Soft, nontender, nondistended. PELVIC: Deferred MUSCULOSKELETAL: Normal range of motion. No edema and no tenderness. 2+ distal pulses.   Imaging Studies: US Pelvis Transvanginal Non-ob (tv Only)  Result Date: 06/27/2018 ULTRASOUND REPORT Location: [facility] Date of Service: 06/27/2018 Patient Name: AUBURN HESTER DOB: 08/20/1951 MRN: [id number] Indications: Postmenopausal Bleeding Findings: The  uterus is anteverted and measures 6.84 x 4.98 x 3.84 CM. Echo texture is heterogenous with evidence of focal masses. Within the uterus are multiple suspected fibroids measuring: Fibroid 1: Intramural vs submucosal measures 16.24 x 13.02 mm Fibroid 2: Possible fibroid vs other etiology in lower uterine segment measures 12.01 x 9.39 mm The Endometrium measures 8.85 mm. Right Ovary measures 2.22 x 1.85 x 1.39 cm. It is normal in appearance. Left Ovary measures 3.59 x 3.04 x 2.38 cm. It contains slight vascular hypoechoic area with border possible endometrioma vs other etiology measures 30.1 x 20.2 x 25.4 mm Survey of the adnexa demonstrates no adnexal masses. There is no free fluid in the cul de sac. Impression: 1. Fibroids are seen, as above. 2.Right ovary slight vascular hypoechoic area with border possible endometrioma vs other etiology measures 30.1 x 20.2 x 25.4 mm Recommendations: 1.Clinical correlation with the patient's History and Physical Exam. Mital bahen Marlowe Sax, RDMS The ultrasound images and findings were reviewed by me and I agree with the above report. Prentice Docker, MD, [facility] 06/27/2018 11:13 AM     Assessment: Patient Active Problem List   Diagnosis Date Noted  . Endometrial polyp 06/27/2018  . Postmenopausal bleeding 06/27/2018    Plan: Patient will undergo surgical management with hysteroscopy, dilation and curettage, polypectomy.   The risks of surgery were discussed in detail with the patient including but not limited to: bleeding which may require transfusion or reoperation; infection which may require antibiotics; injury to surrounding organs which may involve bowel, bladder, ureters ; need for additional procedures including laparoscopy or laparotomy; thromboembolic phenomenon, surgical site problems and other postoperative/anesthesia complications. Likelihood of success in alleviating the patient's condition was discussed. Routine  postoperative instructions will be reviewed with the patient and her family in detail after surgery.  The patient concurred with the proposed plan, giving informed written consent for the surgery.  Preoperative prophylactic antibiotics, as indicated, and SCDs ordered on call to the OR.    Prentice Docker, MD 07/08/2018 8:16 AM

## 2018-07-08 NOTE — Patient Instructions (Signed)
Your procedure is scheduled on: Tues 8/20 Report to Day Surgery. To find out your arrival time please call [phone] between 1PM - 3PM on Mon, 8/19.  Remember: Instructions that are not followed completely may result in serious medical risk,  up to and including death, or upon the discretion of your surgeon and anesthesiologist your  surgery may need to be rescheduled.     _X__ 1. Do not eat food after midnight the night before your procedure.                 No gum chewing or hard candies. You may drink clear liquids up to 2 hours                 before you are scheduled to arrive for your surgery- DO not drink clear                 liquids within 2 hours of the start of your surgery.                 Clear Liquids include:  water,  __X__2.  On the morning of surgery brush your teeth with toothpaste and water, you                may rinse your mouth with mouthwash if you wish.  Do not swallow any toothpaste of mouthwash.     _X__ 3.  No Alcohol for 24 hours before or after surgery.   ___ 4.  Do Not Smoke or use e-cigarettes For 24 Hours Prior to Your Surgery.                 Do not use any chewable tobacco products for at least 6 hours prior to                 surgery.  ____  5.  Bring all medications with you on the day of surgery if instructed.   _x___  6.  Notify your doctor if there is any change in your medical condition      (cold, fever, infections).     Do not wear jewelry, make-up, hairpins, clips or nail polish. Do not wear lotions, powders, or perfumes. You may wear deodorant. Do not shave 48 hours prior to surgery. Men may shave face and neck. Do not bring valuables to the hospital.    [facility] is not responsible for any belongings or valuables.  Contacts, dentures or bridgework may not be worn into surgery. Leave your suitcase in the car. After surgery it may be brought to your room. For patients admitted to the hospital, discharge time  is determined by your treatment team.   Patients discharged the day of surgery will not be allowed to drive home.   Please read over the following fact sheets that you were given:    _x___ Take these medicines the morning of surgery with A SIP OF WATER:    1.acetaminophen (TYLENOL) 500 MG tablet if needed   2. Dimethyl Fumarate (TECFIDERA) 120 & 240 MG MISC  3. famotidine (PEPCID) 20 MG tablet  Dose night before and morning of surgery  4.tamoxifen (NOLVADEX) 20 MG tablet  5.  6.  ____ Fleet Enema (as directed)   ____ Use CHG Soap as directed  ____ Use inhalers on the day of surgery  __x__ Stop metformin 2 days prior to surgery last dose on 8/17    ____ Take 1/2 of usual insulin dose the night before  surgery. No insulin the morning          of surgery.   ____ Stop aspirin on   _x___ Stop Anti-inflammatories meloxicam (MOBIC) 7.5 MG tablets on today   _x___ Stop supplements until after surgery. Omega-3 Fatty Acids (FISH OIL) 1000 MG CAPS   __x__ Bring C-Pap to the hospital.

## 2018-07-08 NOTE — Progress Notes (Signed)
Preoperative History and Physical  Cassidy Norris is a 67 y.o. G1P0010 here for surgical management of postmenopausal bleeding.   No significant preoperative concerns.  History of Present Illness: 67 y.o. G30P0010 female who originally presented for vaginal spotting.  The onset was about 2 months ago.  The spotting is described as light.  She sees spotting on her underwear. She does not have to wipe or wear a pad.  She had no prior workup for this. She had another episode of this about 7 years ago and had a pelvic ultrasound which showed an endometrial stripe of 7 mm.  She denies weight loss.  No alleviating or aggravating factors.  She denies pain.  She notes constipation since November for which she takes colace.  She started taking Tamoxifen in October of last year for her history of breast cancer.  Her last pap smear was in November 2018 and it was normal.   She had a pelvic ultrasound which showed: Adnexa:  Left ovary with 3 x 2 x 2.5 cm hypoechoic area Uterus: anteverted with endometrial stripe  8.9 mm Additional: possible fibroid, intramural versus submucosal that measures 1.6 x 1.3 cm,  Possible fibroid versus other etiology in lower uterine segment measuring 1.2 x 0.94 cm.   Pathology from biopsy at last visit was endometrial polyp with endometrial biopsy returning with no cells.    Proposed surgery: hysteroscopy, dilation and curettage, polypectomy  Past Medical History:  Diagnosis Date  . Anemia   . B12 deficiency 07/23/2016   Will check levels to determine if injections are needed again. Await results. Recent CBC at Onc WNL  . Cancer [facility]) 2014   right breast - Invasive Mammary Carcinoma  . Cancer [facility]) 2014   left breast - Invasive lobular carcinoma and DCIS   . Diabetes mellitus without complication ([place])   . Diffuse cystic mastopathy   . Essential hypertension, benign 09/24/2017  . Family history of malignant neoplasm of gastrointestinal tract 2012  . Fractured elbow  08/28/14  . Hx of metabolic acidosis with increased anion gap   . Increased heart rate   . Multiple sclerosis ([place])   . Neuromuscular disorder ([place])    neuropathy in bil feet - left is worse.  . Obesity, unspecified   . Peripheral neuropathy   . Personal history of tobacco use, presenting hazards to health   . Restless leg syndrome   . Sleep apnea   . Special screening for malignant neoplasms, colon    Past Surgical History:  Procedure Laterality Date  . BREAST BIOPSY Right 1973,2001  . BREAST BIOPSY Right 11-07-13   INVASIVE MAMMARY CARCINOMA , ER/PR positive Her2 negative  . BREAST BIOPSY Left 11-27-13   invasive lobular and DCIS  . BREAST SURGERY Bilateral 01/09/14   mastectomy  . CHOLECYSTECTOMY    . COLONOSCOPY  2010   Dr. Jamal Collin  . COLONOSCOPY WITH PROPOFOL N/A 06/11/2015   Procedure: COLONOSCOPY WITH PROPOFOL;  Surgeon: Christene Lye, MD;  Location: [facility] ENDOSCOPY;  Service: Endoscopy;  Laterality: N/A;  . DILATION AND CURETTAGE OF UTERUS  1983  . POLYPECTOMY  1989  . PORTACATH PLACEMENT  2015  . SPINE SURGERY  09/14/14   Ruptured disk L4- L5  . TOTAL KNEE ARTHROPLASTY Left 11/22/2015   Procedure: TOTAL KNEE ARTHROPLASTY;  Surgeon: Earlie Server, MD;  Location: [facility];  Service: Orthopedics;  Laterality: Left;  . TUBAL LIGATION    . [facility] EXTRACTION  2006   OB History  Gravida Para Term Preterm AB Living  1       1    SAB TAB Ectopic Multiple Live Births  1            # Outcome Date GA Lbr Len/2nd Weight Sex Delivery Anes PTL Lv  1 SAB             Obstetric Comments  Age with first menstruation-13  Age with first pregnancy-31  Patient denies any other pertinent gynecologic issues.   Current Outpatient Medications on File Prior to Visit  Medication Sig Dispense Refill  . acetaminophen (TYLENOL) 500 MG tablet Take 1,000 mg by mouth 2 (two) times daily as needed for moderate pain or headache.    Marland Kitchen atorvastatin (LIPITOR) 20 MG tablet Take 1 tablet  (20 mg total) by mouth daily. (Patient taking differently: Take 20 mg by mouth at bedtime. ) 90 tablet 3  . BAYER CONTOUR NEXT TEST test strip 1 each by Other route daily.   3  . Calcium Carbonate-Vitamin D (CALCIUM 600+D PO) Take 1 tablet by mouth 2 (two) times daily.    . cholecalciferol (VITAMIN D) 1000 units tablet Take 6,000 Units by mouth daily.    . Cyanocobalamin (B-12) 5000 MCG SUBL Place 5,000 mcg under the tongue daily.    . Dimethyl Fumarate (TECFIDERA) 120 & 240 MG MISC Take 1 capsule by mouth as directed. One 120 mg cap BID for 7 days, then one 240 mg cap BID thereafter (Patient taking differently: Take 240 mg by mouth 2 (two) times daily. ) 60 each 3  . docusate sodium (COLACE) 100 MG capsule Take 100 mg by mouth at bedtime.    . famotidine (PEPCID) 20 MG tablet Take 1 tablet (20 mg total) by mouth daily. 90 tablet 1  . fluticasone (FLONASE) 50 MCG/ACT nasal spray Place 2 sprays into both nostrils at bedtime. 16 g 12  . lisinopril (PRINIVIL,ZESTRIL) 5 MG tablet Take 1 tablet (5 mg total) by mouth daily. (Patient taking differently: Take 5 mg by mouth every evening. ) 90 tablet 3  . meloxicam (MOBIC) 7.5 MG tablet TAKE 1 TABLET EVERY DAY (Patient taking differently: Take 7.5 mg by mouth daily. ) 90 tablet 1  . metFORMIN (GLUCOPHAGE) 500 MG tablet Take 1 tablet (500 mg total) by mouth daily with breakfast. For first 2 weeks, just take 1 in the AM and then increase to BID (Patient taking differently: Take 500 mg by mouth daily with breakfast. ) 90 tablet 1  . MICROLET LANCETS MISC   3  . Omega-3 Fatty Acids (FISH OIL) 1000 MG CAPS Take 1,000 mg by mouth at bedtime.    . tamoxifen (NOLVADEX) 20 MG tablet TAKE 1 TABLET (20 MG TOTAL) BY MOUTH DAILY. (Patient taking differently: Take 20 mg by mouth daily. ) 90 tablet 1  . traZODone (DESYREL) 50 MG tablet TAKE 2 TABLETS AT BEDTIME (Patient taking differently: Take 100 mg by mouth at bedtime. ) 180 tablet 1   No current facility-administered  medications on file prior to visit.    No Known Allergies  Social History:   reports that she quit smoking about 4 years ago. Her smoking use included cigarettes. She has a 30.00 pack-year smoking history. She has never used smokeless tobacco. She reports that she does not drink alcohol or use drugs.  Family History  Problem Relation Age of Onset  . Cancer Mother        lung  . Cancer Father  colon, stomach  . Diabetes Maternal Grandmother   . Hypertension Sister   . Rectal cancer Paternal Uncle   . Rectal cancer Paternal Uncle     Review of Systems: Noncontributory  PHYSICAL EXAM: Blood pressure 110/62, pulse 80, height _0  (1.651 m), weight 201 lb (91.2 kg), last menstrual period 12/22/1999, SpO2 95 %. CONSTITUTIONAL: Well-developed, well-nourished female in no acute distress.  HENT:  Normocephalic, atraumatic, External right and left ear normal. Oropharynx is clear and moist EYES: Conjunctivae and EOM are normal. Pupils are equal, round, and reactive to light. No scleral icterus.  NECK: Normal range of motion, supple, no masses SKIN: Skin is warm and dry. No rash noted. Not diaphoretic. No erythema. No pallor. [place]: Alert and oriented to person, place, and time. Normal reflexes, muscle tone coordination. No cranial nerve deficit noted. PSYCHIATRIC: Normal mood and affect. Normal behavior. Normal judgment and thought content. CARDIOVASCULAR: Normal heart rate noted, regular rhythm RESPIRATORY: Effort and breath sounds normal, no problems with respiration noted ABDOMEN: Soft, nontender, nondistended. PELVIC: Deferred MUSCULOSKELETAL: Normal range of motion. No edema and no tenderness. 2+ distal pulses.   Imaging Studies: US Pelvis Transvanginal Non-ob (tv Only)  Result Date: 06/27/2018 ULTRASOUND REPORT Location: [facility] Date of Service: 06/27/2018 Patient Name: Cassidy Norris DOB: 08/20/1951 MRN: [id number] Indications: Postmenopausal Bleeding Findings: The  uterus is anteverted and measures 6.84 x 4.98 x 3.84 CM. Echo texture is heterogenous with evidence of focal masses. Within the uterus are multiple suspected fibroids measuring: Fibroid 1: Intramural vs submucosal measures 16.24 x 13.02 mm Fibroid 2: Possible fibroid vs other etiology in lower uterine segment measures 12.01 x 9.39 mm The Endometrium measures 8.85 mm. Right Ovary measures 2.22 x 1.85 x 1.39 cm. It is normal in appearance. Left Ovary measures 3.59 x 3.04 x 2.38 cm. It contains slight vascular hypoechoic area with border possible endometrioma vs other etiology measures 30.1 x 20.2 x 25.4 mm Survey of the adnexa demonstrates no adnexal masses. There is no free fluid in the cul de sac. Impression: 1. Fibroids are seen, as above. 2.Right ovary slight vascular hypoechoic area with border possible endometrioma vs other etiology measures 30.1 x 20.2 x 25.4 mm Recommendations: 1.Clinical correlation with the patient's History and Physical Exam. Mital bahen Marlowe Sax, RDMS The ultrasound images and findings were reviewed by me and I agree with the above report. Prentice Docker, MD, [facility] 06/27/2018 11:13 AM     Assessment: Patient Active Problem List   Diagnosis Date Noted  . Endometrial polyp 06/27/2018  . Postmenopausal bleeding 06/27/2018    Plan: Patient will undergo surgical management with hysteroscopy, dilation and curettage, polypectomy.   The risks of surgery were discussed in detail with the patient including but not limited to: bleeding which may require transfusion or reoperation; infection which may require antibiotics; injury to surrounding organs which may involve bowel, bladder, ureters ; need for additional procedures including laparoscopy or laparotomy; thromboembolic phenomenon, surgical site problems and other postoperative/anesthesia complications. Likelihood of success in alleviating the patient's condition was discussed. Routine  postoperative instructions will be reviewed with the patient and her family in detail after surgery.  The patient concurred with the proposed plan, giving informed written consent for the surgery.  Preoperative prophylactic antibiotics, as indicated, and SCDs ordered on call to the OR.    Prentice Docker, MD 07/08/2018 8:16 AM

## 2018-07-10 ENCOUNTER — Other Ambulatory Visit: Payer: Self-pay | Admitting: Unknown Physician Specialty

## 2018-07-10 DIAGNOSIS — K219 Gastro-esophageal reflux disease without esophagitis: Secondary | ICD-10-CM

## 2018-07-12 ENCOUNTER — Other Ambulatory Visit: Payer: Self-pay

## 2018-07-12 ENCOUNTER — Ambulatory Visit: Payer: Medicare Other | Admitting: Certified Registered"

## 2018-07-12 ENCOUNTER — Encounter: Payer: Self-pay | Admitting: *Deleted

## 2018-07-12 ENCOUNTER — Ambulatory Visit
Admission: RE | Admit: 2018-07-12 | Discharge: 2018-07-12 | Disposition: A | Discharge status: Home / Self Care | Payer: Medicare Other | Source: Ambulatory Visit | Attending: Obstetrics and Gynecology | Admitting: Obstetrics and Gynecology

## 2018-07-12 ENCOUNTER — Encounter
Admission: RE | Disposition: A | Discharge status: Home / Self Care | Payer: Self-pay | Source: Ambulatory Visit | Attending: Obstetrics and Gynecology

## 2018-07-12 DIAGNOSIS — I10 Essential (primary) hypertension: Secondary | ICD-10-CM | POA: Insufficient documentation

## 2018-07-12 DIAGNOSIS — Z7984 Long term (current) use of oral hypoglycemic drugs: Secondary | ICD-10-CM | POA: Diagnosis not present

## 2018-07-12 DIAGNOSIS — E669 Obesity, unspecified: Secondary | ICD-10-CM | POA: Diagnosis not present

## 2018-07-12 DIAGNOSIS — Z6833 Body mass index (BMI) 33.0-33.9, adult: Secondary | ICD-10-CM | POA: Insufficient documentation

## 2018-07-12 DIAGNOSIS — G35 Multiple sclerosis: Secondary | ICD-10-CM | POA: Insufficient documentation

## 2018-07-12 DIAGNOSIS — Z9013 Acquired absence of bilateral breasts and nipples: Secondary | ICD-10-CM | POA: Insufficient documentation

## 2018-07-12 DIAGNOSIS — Z7951 Long term (current) use of inhaled steroids: Secondary | ICD-10-CM | POA: Insufficient documentation

## 2018-07-12 DIAGNOSIS — G4730 Sleep apnea, unspecified: Secondary | ICD-10-CM | POA: Diagnosis not present

## 2018-07-12 DIAGNOSIS — Z79899 Other long term (current) drug therapy: Secondary | ICD-10-CM | POA: Insufficient documentation

## 2018-07-12 DIAGNOSIS — Z87891 Personal history of nicotine dependence: Secondary | ICD-10-CM | POA: Insufficient documentation

## 2018-07-12 DIAGNOSIS — E1142 Type 2 diabetes mellitus with diabetic polyneuropathy: Secondary | ICD-10-CM | POA: Diagnosis not present

## 2018-07-12 DIAGNOSIS — Z96652 Presence of left artificial knee joint: Secondary | ICD-10-CM | POA: Insufficient documentation

## 2018-07-12 DIAGNOSIS — K219 Gastro-esophageal reflux disease without esophagitis: Secondary | ICD-10-CM | POA: Insufficient documentation

## 2018-07-12 DIAGNOSIS — E538 Deficiency of other specified B group vitamins: Secondary | ICD-10-CM | POA: Insufficient documentation

## 2018-07-12 DIAGNOSIS — Z853 Personal history of malignant neoplasm of breast: Secondary | ICD-10-CM | POA: Diagnosis not present

## 2018-07-12 DIAGNOSIS — N840 Polyp of corpus uteri: Secondary | ICD-10-CM | POA: Insufficient documentation

## 2018-07-12 DIAGNOSIS — N950 Postmenopausal bleeding: Secondary | ICD-10-CM | POA: Insufficient documentation

## 2018-07-12 DIAGNOSIS — E1140 Type 2 diabetes mellitus with diabetic neuropathy, unspecified: Secondary | ICD-10-CM | POA: Diagnosis not present

## 2018-07-12 DIAGNOSIS — Z791 Long term (current) use of non-steroidal anti-inflammatories (NSAID): Secondary | ICD-10-CM | POA: Diagnosis not present

## 2018-07-12 DIAGNOSIS — N959 Unspecified menopausal and perimenopausal disorder: Secondary | ICD-10-CM | POA: Diagnosis not present

## 2018-07-12 HISTORY — PX: DILATATION & CURETTAGE/HYSTEROSCOPY WITH MYOSURE: SHX6511

## 2018-07-12 LAB — ABO/RH: ABO/RH(D): O POS

## 2018-07-12 LAB — GLUCOSE, CAPILLARY
Glucose-Capillary: 117 mg/dL — ABNORMAL HIGH (ref 70–99)
Glucose-Capillary: 126 mg/dL — ABNORMAL HIGH (ref 70–99)

## 2018-07-12 SURGERY — DILATATION & CURETTAGE/HYSTEROSCOPY WITH MYOSURE
Anesthesia: General

## 2018-07-12 MED ORDER — OXYCODONE HCL 5 MG PO TABS: 5.0000 mg | ORAL_TABLET | Freq: Once | ORAL | Status: DC | PRN

## 2018-07-12 MED ORDER — GLYCOPYRROLATE 0.2 MG/ML IJ SOLN
INTRAMUSCULAR | Status: AC
  Filled 2018-07-12: qty 1

## 2018-07-12 MED ORDER — LIDOCAINE HCL (CARDIAC) PF 100 MG/5ML IV SOSY
PREFILLED_SYRINGE | INTRAVENOUS | Status: DC | PRN
  Administered 2018-07-12: 100 mg via INTRAVENOUS

## 2018-07-12 MED ORDER — DEXAMETHASONE SODIUM PHOSPHATE 10 MG/ML IJ SOLN
INTRAMUSCULAR | Status: DC | PRN
  Administered 2018-07-12: 5 mg via INTRAVENOUS

## 2018-07-12 MED ORDER — ACETAMINOPHEN NICU IV SYRINGE 10 MG/ML
INTRAVENOUS | Status: AC
  Filled 2018-07-12: qty 1

## 2018-07-12 MED ORDER — ONDANSETRON HCL 4 MG/2ML IJ SOLN
INTRAMUSCULAR | Status: AC
  Filled 2018-07-12: qty 2

## 2018-07-12 MED ORDER — MIDAZOLAM HCL 2 MG/2ML IJ SOLN
INTRAMUSCULAR | Status: AC
  Filled 2018-07-12: qty 4

## 2018-07-12 MED ORDER — MIDAZOLAM HCL 2 MG/2ML IJ SOLN
INTRAMUSCULAR | Status: DC | PRN
  Administered 2018-07-12 (×2): 2 mg via INTRAVENOUS

## 2018-07-12 MED ORDER — PHENYLEPHRINE HCL 10 MG/ML IJ SOLN
INTRAMUSCULAR | Status: DC | PRN
  Administered 2018-07-12 (×2): 100 ug via INTRAVENOUS

## 2018-07-12 MED ORDER — HYDROMORPHONE HCL 1 MG/ML IJ SOLN
INTRAMUSCULAR | Status: AC
  Filled 2018-07-12: qty 1

## 2018-07-12 MED ORDER — PROPOFOL 10 MG/ML IV BOLUS
INTRAVENOUS | Status: AC
  Filled 2018-07-12: qty 20

## 2018-07-12 MED ORDER — IBUPROFEN 600 MG PO TABS: 600.0000 mg | ORAL_TABLET | Freq: Four times a day (QID) | ORAL | 0 refills | Status: DC | PRN

## 2018-07-12 MED ORDER — LIDOCAINE HCL (PF) 2 % IJ SOLN
INTRAMUSCULAR | Status: AC
  Filled 2018-07-12: qty 10

## 2018-07-12 MED ORDER — KETOROLAC TROMETHAMINE 30 MG/ML IJ SOLN
INTRAMUSCULAR | Status: DC | PRN
  Administered 2018-07-12: 30 mg via INTRAVENOUS

## 2018-07-12 MED ORDER — ONDANSETRON HCL 4 MG/2ML IJ SOLN
INTRAMUSCULAR | Status: DC | PRN
  Administered 2018-07-12: 4 mg via INTRAVENOUS

## 2018-07-12 MED ORDER — ACETAMINOPHEN 10 MG/ML IV SOLN
INTRAVENOUS | Status: DC | PRN
  Administered 2018-07-12: 1000 mg via INTRAVENOUS

## 2018-07-12 MED ORDER — SILVER NITRATE-POT NITRATE 75-25 % EX MISC
CUTANEOUS | Status: AC
  Filled 2018-07-12: qty 8

## 2018-07-12 MED ORDER — LACTATED RINGERS IV SOLN
INTRAVENOUS | Status: DC | PRN
  Administered 2018-07-12 (×2): via INTRAVENOUS

## 2018-07-12 MED ORDER — SODIUM CHLORIDE 0.9 % IV SOLN
INTRAVENOUS | Status: DC
  Administered 2018-07-12: 09:00:00 via INTRAVENOUS

## 2018-07-12 MED ORDER — HYDROMORPHONE HCL 1 MG/ML IJ SOLN
INTRAMUSCULAR | Status: DC | PRN
  Administered 2018-07-12: 1 mg via INTRAVENOUS

## 2018-07-12 MED ORDER — PROPOFOL 10 MG/ML IV BOLUS
INTRAVENOUS | Status: DC | PRN
  Administered 2018-07-12: 50 mg via INTRAVENOUS
  Administered 2018-07-12: 150 mg via INTRAVENOUS

## 2018-07-12 MED ORDER — FENTANYL CITRATE (PF) 100 MCG/2ML IJ SOLN: 25.0000 ug | INTRAMUSCULAR | Status: DC | PRN

## 2018-07-12 MED ORDER — OXYCODONE HCL 5 MG/5ML PO SOLN: 5.0000 mg | Freq: Once | ORAL | Status: DC | PRN

## 2018-07-12 MED ORDER — LABETALOL HCL 5 MG/ML IV SOLN
INTRAVENOUS | Status: DC | PRN
  Administered 2018-07-12: 5 mg via INTRAVENOUS

## 2018-07-12 MED ORDER — PHENYLEPHRINE HCL 10 MG/ML IJ SOLN
INTRAMUSCULAR | Status: AC
  Filled 2018-07-12: qty 1

## 2018-07-12 MED ORDER — DEXAMETHASONE SODIUM PHOSPHATE 10 MG/ML IJ SOLN
INTRAMUSCULAR | Status: AC
  Filled 2018-07-12: qty 1

## 2018-07-12 MED ORDER — GLYCOPYRROLATE 0.2 MG/ML IJ SOLN
INTRAMUSCULAR | Status: DC | PRN
  Administered 2018-07-12: 0.1 mg via INTRAVENOUS

## 2018-07-12 SURGICAL SUPPLY — 23 items
BAG URINE DRAINAGE (UROLOGICAL SUPPLIES) IMPLANT
CANISTER SUC SOCK COL 7IN (MISCELLANEOUS) ×3 IMPLANT
CATH FOLEY 2WAY  5CC 16FR (CATHETERS) ×2
CATH ROBINSON RED A/P 16FR (CATHETERS) ×1 IMPLANT
CATH URTH 16FR FL 2W BLN LF (CATHETERS) IMPLANT
DEVICE MYOSURE LITE (MISCELLANEOUS) ×3 IMPLANT
DEVICE MYOSURE REACH (MISCELLANEOUS) ×1 IMPLANT
ELECT REM PT RETURN 9FT ADLT (ELECTROSURGICAL) ×3
ELECTRODE REM PT RTRN 9FT ADLT (ELECTROSURGICAL) ×1 IMPLANT
GLOVE BIO SURGEON STRL SZ7 (GLOVE) ×1 IMPLANT
GLOVE BIOGEL PI IND STRL 7.5 (GLOVE) ×1 IMPLANT
GLOVE BIOGEL PI INDICATOR 7.5 (GLOVE)
GOWN STRL REUS W/ TWL LRG LVL3 (GOWN DISPOSABLE) ×2 IMPLANT
GOWN STRL REUS W/TWL LRG LVL3 (GOWN DISPOSABLE) ×4
IV LACTATED RINGER IRRG 3000ML (IV SOLUTION) ×2
IV LR IRRIG 3000ML ARTHROMATIC (IV SOLUTION) ×1 IMPLANT
KIT TURNOVER CYSTO (KITS) ×3 IMPLANT
PACK DNC HYST (MISCELLANEOUS) ×3 IMPLANT
PAD OB MATERNITY 4.3X12.25 (PERSONAL CARE ITEMS) ×3 IMPLANT
PAD PREP 24X41 OB/GYN DISP (PERSONAL CARE ITEMS) ×3 IMPLANT
TUBING CONNECTING 10 (TUBING) ×2 IMPLANT
TUBING CONNECTING 10' (TUBING) ×1
TUBING HYSTEROSCOPY DOLPHIN (MISCELLANEOUS) ×3 IMPLANT

## 2018-07-12 NOTE — Anesthesia Procedure Notes (Signed)
Procedure Name: LMA Insertion Date/Time: 07/12/2018 10:10 AM Performed by: Nile Riggs, CRNA Pre-anesthesia Checklist: Emergency Drugs available, Suction available, Patient identified, Patient being monitored and Timeout performed Patient Re-evaluated:Patient Re-evaluated prior to induction Oxygen Delivery Method: Circle system utilized Preoxygenation: Pre-oxygenation with 100% oxygen Induction Type: IV induction Ventilation: Mask ventilation without difficulty LMA: LMA inserted LMA Size: 4.0 Tube type: Oral Number of attempts: 1 Placement Confirmation: positive ETCO2,  CO2 detector and breath sounds checked- equal and bilateral Tube secured with: Tape Dental Injury: Teeth and Oropharynx as per pre-operative assessment  Comments: Atraumatic seating of AuraStraight LMA

## 2018-07-12 NOTE — Op Note (Signed)
Operative Note   07/12/2018  PRE-OP DIAGNOSIS:  1) postmenopausal bleeding 2) endometrial polyp   POST-OP DIAGNOSIS:  1) postmenopausal bleeding 2) endometrial polyp   SURGEON: Surgeon(s) and Role:    Will Bonnet, MD - Primary   PROCEDURE: Procedure(s): 1) Hysteroscopy 2) dilation and curettage 3) endometrial polypectomy  ANESTHESIA: General ET  ESTIMATED BLOOD LOSS: 25 mL  DRAINS: none   TOTAL IV FLUIDS: 1,000 mL crystalloid  SPECIMENS:  1) endometrial curettings 2) endometrial polyp  VTE PROPHYLAXIS: SCDs to the bilateral lower extremities  ANTIBIOTICS: none indicated, none given  FLUID DEFICIT: minimal  COMPLICATIONS: none  DISPOSITION: PACU - hemodynamically stable.  CONDITION: stable  INDICATION: 67 y.o. [id number] female with postmenopausal bleeding.  Endometrial polyp noted on pipelle endometrial biopsy pathology.  FINDINGS: Exam under anesthesia revealed small, mobile uterus with no masses and bilateral adnexa without masses or fullness, though exam limited by body habitus. Hysteroscopy revealed an endometrial polyp originated at the uterine fundus with a thin stalk that was also attached to the anterior uterine wall.  Otherwise, a grossly normal appearing uterine cavity with bilateral tubal ostia and normal appearing endocervical canal.  PROCEDURE IN DETAIL:  After informed consent was obtained, the patient was taken to the operating room where anesthesia was obtained without difficulty. The patient was positioned in the dorsal lithotomy position in [place].  The patient's bladder was catheterized with an in and out foley catheter.  The patient was examined under anesthesia, with the above noted findings.  The bi-valved speculum was placed inside the patient's vagina, and the the anterior lip of the cervix was seen and grasped with the tenaculum.  The cervix was progressively dilated to a 80mm hegar dilator.  The hysteroscope was introduced, with  the above noted findings. The MyoSure Light device was gently introduced and the polyp was removed without difficulty in its entirety. The hystersocope was removed and the uterine cavity was curetted until a gritty texture was noted.  The hysteroscope was reinserted and excellent hemostasis was noted.  All instruments were removed, with hemostasis noted at the tenaculum entry sites.  She was then taken out of dorsal lithotomy.  The patient tolerated the procedure well.  Sponge, lap and needle counts were correct x2.  The patient was taken to recovery room in excellent condition.   Will Bonnet, MD, [place] 07/12/2018 11:11 AM

## 2018-07-12 NOTE — Transfer of Care (Signed)
Immediate Anesthesia Transfer of Care Note  Patient: Cassidy Norris  Procedure(s) Performed: DILATATION & CURETTAGE/HYSTEROSCOPY WITH MYOSURE WITH ENDOMETRIAL POLYPECTOMY (N/A )  Patient Location: PACU  Anesthesia Type:General  Level of Consciousness: awake, alert , oriented and patient cooperative  Airway & Oxygen Therapy: Patient Spontanous Breathing and Patient connected to face mask oxygen  Post-op Assessment: Report given to RN, Post -op Vital signs reviewed and stable and Patient moving all extremities  Post vital signs: Reviewed and stable  Last Vitals:  Vitals Value Taken Time  BP 124/63 07/12/2018 11:22 AM  Temp    Pulse 96 07/12/2018 11:23 AM  Resp 15 07/12/2018 11:23 AM  SpO2 100 % 07/12/2018 11:23 AM  Vitals shown include unvalidated device data.  Last Pain:  Vitals:   07/12/18 0826  TempSrc: Temporal  PainSc: 3          Complications: No apparent anesthesia complications

## 2018-07-12 NOTE — Anesthesia Preprocedure Evaluation (Signed)
Anesthesia Evaluation  Patient identified by MRN, date of birth, ID band Patient awake    Reviewed: Allergy & Precautions, H&P , NPO status , Patient's Chart, lab work & pertinent test results  Airway Mallampati: III  TM Distance: >3 FB Neck ROM: full    Dental  (+) Teeth Intact   Pulmonary neg pulmonary ROS, sleep apnea and Continuous Positive Airway Pressure Ventilation , neg COPD, former smoker,    breath sounds clear to auscultation       Cardiovascular hypertension, On Medications (-) angina(-) Past MI, (-) Cardiac Stents and (-) CHF negative cardio ROS  (-) dysrhythmias  Rhythm:regular Rate:Normal     Neuro/Psych  Neuromuscular disease negative neurological ROS  negative psych ROS   GI/Hepatic negative GI ROS, Neg liver ROS, GERD  Controlled,  Endo/Other  negative endocrine ROSdiabetes, Type 2, Oral Hypoglycemic Agents  Renal/GU negative Renal ROS  negative genitourinary   Musculoskeletal  (+) Arthritis ,   Abdominal   Peds  Hematology negative hematology ROS (+) anemia ,   Anesthesia Other Findings Past Medical History: No date: Anemia 07/23/2016: B12 deficiency     Comment:  Will check levels to determine if injections are needed               again. Await results. Recent CBC at Onc WNL 2014: Cancer [facility])     Comment:  right breast - Invasive Mammary Carcinoma 2014: Cancer ([place])     Comment:  left breast - Invasive lobular carcinoma and DCIS  No date: Diabetes mellitus without complication ([place]) No date: Diffuse cystic mastopathy 09/24/2017: Essential hypertension, benign 2012: Family history of malignant neoplasm of gastrointestinal tract 08/28/14: Fractured elbow No date: Hx of metabolic acidosis with increased anion gap No date: Increased heart rate No date: Multiple sclerosis (HCC) No date: Neuromuscular disorder ([place])     Comment:  neuropathy in bil feet - left is worse. No date: Obesity,  unspecified No date: Peripheral neuropathy No date: Personal history of tobacco use, presenting hazards to health No date: Restless leg syndrome No date: Sleep apnea     Comment:  uses CPAP No date: Special screening for malignant neoplasms, colon  Past Surgical History: 7425,9563: BREAST BIOPSY; Right 11-07-13: BREAST BIOPSY; Right     Comment:  INVASIVE MAMMARY CARCINOMA , ER/PR positive Her2               negative 11-27-13: BREAST BIOPSY; Left     Comment:  invasive lobular and DCIS 01/09/14: BREAST SURGERY; Bilateral     Comment:  mastectomy No date: CHOLECYSTECTOMY 2010: COLONOSCOPY     Comment:  Dr. Jamal Collin 06/11/2015: COLONOSCOPY WITH PROPOFOL; N/A     Comment:  Procedure: COLONOSCOPY WITH PROPOFOL;  Surgeon:               Christene Lye, MD;  Location: [facility] ENDOSCOPY;                Service: Endoscopy;  Laterality: N/A; 1983: DILATION AND CURETTAGE OF UTERUS 1989: POLYPECTOMY No date: PORTA CATH REMOVAL 2015: PORTACATH PLACEMENT 09/14/14: SPINE SURGERY     Comment:  Ruptured disk L4- L5 11/22/2015: TOTAL KNEE ARTHROPLASTY; Left     Comment:  Procedure: TOTAL KNEE ARTHROPLASTY;  Surgeon: Earlie Server, MD;  Location: [place];  Service: Orthopedics;                Laterality: Left; No date: TUBAL  LIGATION 2006: WISDOM TOOTH EXTRACTION  BMI    Body Mass Index:  33.46 kg/m      Reproductive/Obstetrics negative OB ROS                             Anesthesia Physical Anesthesia Plan  ASA: III  Anesthesia Plan: General   Post-op Pain Management:    Induction:   PONV Risk Score and Plan: Ondansetron and Dexamethasone  Airway Management Planned:   Additional Equipment:   Intra-op Plan:   Post-operative Plan:   Informed Consent: I have reviewed the patients History and Physical, chart, labs and discussed the procedure including the risks, benefits and alternatives for the proposed anesthesia with the patient or  authorized representative who has indicated his/her understanding and acceptance.   Dental Advisory Given  Plan Discussed with: Anesthesiologist, CRNA and Surgeon  Anesthesia Plan Comments:         Anesthesia Quick Evaluation

## 2018-07-12 NOTE — Interval H&P Note (Signed)
History and Physical Interval Note:  07/12/2018 9:07 AM  Cassidy Norris  has presented today for surgery, with the diagnosis of post menopausal bleeding,endometrial polyp  The various methods of treatment have been discussed with the patient and family. After consideration of risks, benefits and other options for treatment, the patient has consented to  Procedure(s): DILATATION & CURETTAGE/HYSTEROSCOPY WITH MYOSURE WITH ENDOMETRIAL POLYPECTOMY (N/A) as a surgical intervention .  The patient's history has been reviewed, patient examined, no change in status, stable for surgery.  I have reviewed the patient's chart and labs.  Questions were answered to the patient's satisfaction.    Prentice Docker, MD, [facility], [facility] 07/12/2018 9:07 AM

## 2018-07-12 NOTE — Discharge Instructions (Signed)

## 2018-07-12 NOTE — Anesthesia Postprocedure Evaluation (Signed)
Anesthesia Post Note  Patient: Cassidy Norris  Procedure(s) Performed: DILATATION & CURETTAGE/HYSTEROSCOPY WITH MYOSURE WITH ENDOMETRIAL POLYPECTOMY (N/A )  Patient location during evaluation: PACU Anesthesia Type: General Level of consciousness: awake and alert Pain management: pain level controlled Vital Signs Assessment: post-procedure vital signs reviewed and stable Respiratory status: spontaneous breathing, nonlabored ventilation and respiratory function stable Cardiovascular status: blood pressure returned to baseline and stable Postop Assessment: no apparent nausea or vomiting Anesthetic complications: no     Last Vitals:  Vitals:   07/12/18 1145 07/12/18 1152  BP: 118/69 119/70  Pulse: 95 86  Resp: 20 13  Temp:  37.1 C  SpO2: 99% 98%    Last Pain:  Vitals:   07/12/18 1152  TempSrc:   PainSc: 0-No pain                 Durenda Hurt

## 2018-07-12 NOTE — Anesthesia Post-op Follow-up Note (Signed)
Anesthesia QCDR form completed.        

## 2018-07-13 ENCOUNTER — Other Ambulatory Visit: Payer: Self-pay | Admitting: Oncology

## 2018-07-13 LAB — TYPE AND SCREEN
ABO/RH(D): O POS
ANTIBODY SCREEN: POSITIVE
PT AG TYPE: NEGATIVE
UNIT DIVISION: 0
Unit division: 0

## 2018-07-13 LAB — BPAM RBC
Blood Product Expiration Date: 201909052359
Blood Product Expiration Date: 201909052359
Unit Type and Rh: 5100
Unit Type and Rh: 5100

## 2018-07-14 LAB — SURGICAL PATHOLOGY

## 2018-07-15 ENCOUNTER — Telehealth: Payer: Self-pay | Admitting: Obstetrics and Gynecology

## 2018-07-15 NOTE — Telephone Encounter (Signed)
Discussed pathology results. Patient doing well. No acute issues today.

## 2018-07-22 ENCOUNTER — Ambulatory Visit (INDEPENDENT_AMBULATORY_CARE_PROVIDER_SITE_OTHER): Payer: Medicare Other | Admitting: Family Medicine

## 2018-07-22 ENCOUNTER — Encounter: Payer: Self-pay | Admitting: Family Medicine

## 2018-07-22 VITALS — BP 107/69 | HR 74 | Temp 98.3°F | Resp 16 | Ht 65.0 in | Wt 197.0 lb

## 2018-07-22 DIAGNOSIS — T451X5A Adverse effect of antineoplastic and immunosuppressive drugs, initial encounter: Secondary | ICD-10-CM

## 2018-07-22 DIAGNOSIS — Z853 Personal history of malignant neoplasm of breast: Secondary | ICD-10-CM | POA: Diagnosis not present

## 2018-07-22 DIAGNOSIS — E785 Hyperlipidemia, unspecified: Secondary | ICD-10-CM

## 2018-07-22 DIAGNOSIS — G35 Multiple sclerosis: Secondary | ICD-10-CM | POA: Diagnosis not present

## 2018-07-22 DIAGNOSIS — K219 Gastro-esophageal reflux disease without esophagitis: Secondary | ICD-10-CM | POA: Diagnosis not present

## 2018-07-22 DIAGNOSIS — G620 Drug-induced polyneuropathy: Secondary | ICD-10-CM | POA: Diagnosis not present

## 2018-07-22 DIAGNOSIS — I10 Essential (primary) hypertension: Secondary | ICD-10-CM

## 2018-07-22 DIAGNOSIS — E1169 Type 2 diabetes mellitus with other specified complication: Secondary | ICD-10-CM

## 2018-07-22 DIAGNOSIS — Z789 Other specified health status: Secondary | ICD-10-CM

## 2018-07-22 LAB — POCT GLYCOSYLATED HEMOGLOBIN (HGB A1C): Hemoglobin A1C: 5.9 % — AB (ref 4.0–5.6)

## 2018-07-22 MED ORDER — FAMOTIDINE 20 MG PO TABS: 20.0000 mg | ORAL_TABLET | Freq: Two times a day (BID) | ORAL | 3 refills | Status: DC

## 2018-07-22 NOTE — Progress Notes (Signed)
Subjective:    Patient ID: Cassidy Norris, female    DOB: 18-May-1951, 67 y.o.   MRN: [id number]  Cassidy Norris is a 67 y.o. female presenting on 07/22/2018 for Diabetes and Hypertension   HPI   CHRONIC DM, Type 2 / Chemotherapy toxic neuropathy Reports no concerns, doing well with sugar, previously in May 2019 she was reduced on Metformin from 500 BID down to daily CBGs: not checking, needs new glucometer in future, she declines. Checks very rarely with prior meter, avg 100-125 Meds: Metformin 556m daily Reports good compliance. Tolerating well w/o side-effects Currently on ACEi Lifestyle: - Diet (balanced diet, improved carbs and sugars)  - Exercise (active, improving) L>R bilateral foot neuropathy secondary to chemotherapy Denies hypoglycemia, polyuria, visual changes, numbness or tingling.  GERD Request refill on Famotidine 2579m sometimes has refractory symptoms.  HYPERLIPIDEMIA: - Reports concerns with leg cramps, occurs often, painful bilateral legs not typical calf cramps. She was started on Atorvastatin back in 03/2018 and it seemed to worsen her leg cramps and muscle aches. She is not interested in repeat statin at this time. No known history of heart disease. Last lab 03/2018, showed improved HDL, normal LDL, and mild elevated TG - No longer on cholesterol med  CHRONIC HTN: Reports she was started on Lisinopril 79m66mack in February 2019, she was taken off of Carvedilol She checks BP regularly, previously normal BP before cancer. After she has had variable readings up to SBP 140-150s in past. Current Meds - Lisinopril 79mg61mF Carvedilol Reports good compliance, took meds today. Tolerating well, w/o complaints. Denies CP, dyspnea, HA, edema, dizziness / lightheadedness  History of Bilateral Breast cancer See chart for background, prior biopsy confirmed bilateral breast cancer, invasive, s/p bilateral mastectomy, on chemotherapy options. [facility], treated with adjuvant chemotherapy in past, completed in 2015. Switched to Tamoxifen for goal of 5 years completed treatment until 2021. No mammograms required d/t mastectomy. Follos q 6 months.  Multiple S[facility]i Likens[facility] seen 12/2017, has had MRI testing, confirmed in past. Treated with disease modifying treatment. - Requested lab results for monitoring, she stated he ordered labs CMET, CBC Vitamin D back after last visit and they are due to be collected 1 week prior to her next Neurology apt in 08/12/18. She was told can have them drawn here at our office.  Muscle Cramps See above Also states she is taking vinegar as preventative or PRN with cramps. Not on muscle relaxant   Health Maintenance:  Due for Flu Shot and initial Prevnar-13 pneumonia vaccine, declines today despite counseling on benefits   Depression screen PHQ [facility] 07/22/2018 09/24/2017 09/21/2016  Decreased Interest 0 0 0  Down, Depressed, Hopeless 0 0 0  PHQ - 2 Score 0 0 0  Altered sleeping - 2 -  Tired, decreased energy - 3 -  Change in appetite - 3 -  Feeling bad or failure about yourself  - 0 -  Trouble concentrating - 0 -  Moving slowly or fidgety/restless - 1 -  Suicidal thoughts - 0 -  PHQ-9 Score - 9 -   Past Surgical History:  Procedure Laterality Date  . BREAST BIOPSY Right 1973,2001  . BREAST BIOPSY Right 11-07-13   INVASIVE MAMMARY CARCINOMA , ER/PR positive Her2 negative  . BREAST BIOPSY Left 11-27-13   invasive lobular and DCIS  . BREAST SURGERY Bilateral 01/09/14   mastectomy  . CHOLECYSTECTOMY    .  COLONOSCOPY  2010   Dr. Jamal Collin  . COLONOSCOPY WITH PROPOFOL N/A 06/11/2015   Procedure: COLONOSCOPY WITH PROPOFOL;  Surgeon: Christene Lye, MD;  Location: [facility] ENDOSCOPY;  Service: Endoscopy;  Laterality: N/A;  . DILATATION & CURETTAGE/HYSTEROSCOPY WITH MYOSURE N/A 07/12/2018   Procedure: DILATATION & CURETTAGE/HYSTEROSCOPY WITH MYOSURE WITH  ENDOMETRIAL POLYPECTOMY;  Surgeon: Will Bonnet, MD;  Location: [facility] ORS;  Service: Gynecology;  Laterality: N/A;  . DILATION AND CURETTAGE OF UTERUS  1983  . POLYPECTOMY  1989  . PORTA CATH REMOVAL    . PORTACATH PLACEMENT  2015  . SPINE SURGERY  09/14/14   Ruptured disk L4- L5  . TOTAL KNEE ARTHROPLASTY Left 11/22/2015   Procedure: TOTAL KNEE ARTHROPLASTY;  Surgeon: Earlie Server, MD;  Location: [place];  Service: Orthopedics;  Laterality: Left;  . TUBAL LIGATION    . WISDOM TOOTH EXTRACTION  2006    Social History   Tobacco Use  . Smoking status: Former Smoker    Packs/day: 1.00    Years: 30.00    Pack years: 30.00    Types: Cigarettes    Last attempt to quit: 12/11/2013    Years since quitting: 4.6  . Smokeless tobacco: Former Network engineer Use Topics  . Alcohol use: No    Alcohol/week: 0.0 standard drinks    Comment: social  . Drug use: No    Review of Systems Per HPI unless specifically indicated above     Objective:    BP 107/69   Pulse 74   Temp 98.3 F (36.8 C) (Oral)   Resp 16   Ht 5' 5"  (1.651 m)   Wt 197 lb (89.4 kg)   LMP 12/22/1999 (Approximate)   BMI 32.78 kg/m   Wt Readings from Last 3 Encounters:  07/22/18 197 lb (89.4 kg)  07/12/18 201 lb 1 oz (91.2 kg)  07/08/18 201 lb (91.2 kg)    Physical Exam  Constitutional: She is oriented to person, place, and time. She appears well-developed and well-nourished. No distress.  Well-appearing, comfortable, cooperative  HENT:  Head: Normocephalic and atraumatic.  Mouth/Throat: Oropharynx is clear and moist.  Eyes: Conjunctivae are normal. Right eye exhibits no discharge. Left eye exhibits no discharge.  Cardiovascular: Normal rate, regular rhythm, normal heart sounds and intact distal pulses.  No murmur heard. Pulmonary/Chest: Effort normal and breath sounds normal. No respiratory distress. She has no wheezes. She has no rales.  Musculoskeletal: Normal range of motion. She exhibits no edema.    Neurological: She is alert and oriented to person, place, and time.  Skin: Skin is warm and dry. No rash noted. She is not diaphoretic. No erythema.  Psychiatric: She has a normal mood and affect. Her behavior is normal.  Well groomed, good eye contact, normal speech and thoughts  Nursing note and vitals reviewed.    Diabetic Foot Exam - Simple   Simple Foot Form Diabetic Foot exam was performed with the following findings:  Yes 07/22/2018  9:08 AM  Visual Inspection No deformities, no ulcerations, no other skin breakdown bilaterally:  Yes Sensation Testing See comments:  Yes Pulse Check Posterior Tibialis and Dorsalis pulse intact bilaterally:  Yes Comments Reduced bilateral monofilament sensation bilateral plantar only forefoot great toe and lateral only L>R, otherwise still mildly intact overall, dorsal intact.     Recent Labs    01/20/18 1027 04/14/18 0758 07/22/18 0848  HGBA1C 6.1 5.9* 5.9*    Results for orders placed or performed in visit on 07/22/18  POCT HgB A1C  Result Value Ref Range   Hemoglobin A1C 5.9 (A) 4.0 - 5.6 %      Assessment & Plan:   Problem List Items Addressed This Visit    Essential hypertension, benign    Well-controlled HTN - Home BP readings normal  No known complications  OFF Carvedilol from prior visit 03/2018, not indicated if normal HR and no cardiac etiology.   Plan:  1. Continue current BP regimen - Lisinopril 28m daily, agree with this change from last visit as she is diabetic. 2. Encourage improved lifestyle - low sodium diet, regular exercise 3. Continue monitor BP outside office, bring readings to next visit, if persistently >140/90 or new symptoms notify office sooner      Gastroesophageal reflux disease    Refill Famotidine, given enough for BID dosing if preferred      Relevant Medications   famotidine (PEPCID) 20 MG tablet   History of bilateral breast cancer    Stable without known recurrence [facility]      Hyperlipidemia associated with type 2 diabetes mellitus ([place]    Mostly Controlled cholesterol on lifestyle Last lipid panel 03/2018 Calculated ASCVD 10 yr risk score is elevated / has history of diabetes Failed Atorvastatin 219mdue to myalgia  Plan: 1. Discussion again today on ASCVD risk as other providers have discussed with her. She declines to restart statin at this time. Recommended that in future she reconsider statin we could try different type and lower dose and intermittent dosing, ideally she would be on moderate intensity for prevention - she will reconsider in future 2. Encourage improved lifestyle - low carb/cholesterol, reduce portion size, continue improving regular exercise  Follow-up 9 months fasting labs lipids      Relevant Medications   metFORMIN (GLUCOPHAGE) 500 MG tablet   Multiple sclerosis (HCC)    [facility] Ja[facility]i Likensontinue current therapy Next f/u 07/2018  I have located some future active orders CMET, CBC, Vitamin D in Epic for [facility] - she will return to our office for convenience approx 1 week before 08/12/18 to have fasting lab only - we will release these labs when she notifies usKoreand the results will be sent to Dr JaTomi Likens    Peripheral [facility] due to [facility] ([facility]   Chronic problem, stable Secondary to chemotherapy Less likely secondary to diabetes, but possible contribution Re-address at future visit if not improving      Statin intolerance    Prior myalgia and muscle cramp due to statin      Type 2 diabetes mellitus with other specified complication ([place] [place]    Well-controlled DM with A1c 5.9, stable from last No hyperglycemia. No hypoglycemia. 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 Continue current therapy - Metformin  5001maily 2. Encourage improved lifestyle - low carb, low sugar diet, reduce portion size, continue improving regular exercise 3. Check CBG if needed - she may need new glucometer, will send us [place]ame from medicare 4. Continue ACEi, - refused restart Statin 5. DM Foot exam done today / Advised to schedule DM ophtho exam, send record 6. Follow-up 9 months for yearly with labs       Relevant Medications   metFORMIN (GLUCOPHAGE) 500 MG tablet   Other Relevant Orders   POCT HgB A1C (Completed)  Meds ordered this encounter  Medications  . famotidine (PEPCID) 20 MG tablet    Sig: Take 1 tablet (20 mg total) by mouth 2 (two) times daily before a meal.    Dispense:  180 tablet    Refill:  3    Follow up plan: Return in about 9 months (around 04/22/2019) for Yearly Medicare Checkup + AMW [place].  Future labs already ordered from neurology Dr Tomi Likens for 07/2018 - once released, we will place future orders for next year 03/2019.  Nobie Putnam, [facility] 07/22/2018, 1:30 PM

## 2018-07-22 NOTE — Assessment & Plan Note (Signed)
Well-controlled HTN - Home BP readings normal  No known complications  OFF Carvedilol from prior visit 03/2018, not indicated if normal HR and no cardiac etiology.   Plan:  1. Continue current BP regimen - Lisinopril 5mg  daily, agree with this change from last visit as she is diabetic. 2. Encourage improved lifestyle - low sodium diet, regular exercise 3. Continue monitor BP outside office, bring readings to next visit, if persistently >140/90 or new symptoms notify office sooner

## 2018-07-22 NOTE — Assessment & Plan Note (Signed)
Mostly Controlled cholesterol on lifestyle Last lipid panel 03/2018 Calculated ASCVD 10 yr risk score is elevated / has history of diabetes Failed Atorvastatin 20mg  due to myalgia  Plan: 1. Discussion again today on ASCVD risk as other providers have discussed with her. She declines to restart statin at this time. Recommended that in future she reconsider statin we could try different type and lower dose and intermittent dosing, ideally she would be on moderate intensity for prevention - she will reconsider in future 2. Encourage improved lifestyle - low carb/cholesterol, reduce portion size, continue improving regular exercise  Follow-up 9 months fasting labs lipids

## 2018-07-22 NOTE — Patient Instructions (Addendum)
Thank you for coming to the office today.  Please schedule and return for a NURSE ONLY VISIT for VACCINE - Need High Dose Flu Vaccine - Need Prevnar-13 (initial Pneumonia Vaccine >age 66) - then 1 year due for Pneumovax-23 - 2nd final dose  ---------------  Remain off Atorvastatin for now - as discussed this is to prevent future risk of heart attack and stroke. We can reconsider an alternative in the future. Also can do lower dose or intermittent not every day.  Leg cramps - Try spoonful of yellow mustard to relieve leg cramps or try daily to prevent the problem  - OTC natural option is Hyland's Leg Cramps (Dissolving tablet) take as needed for muscle cramps  Consider muscle relaxant - such as Flexeril or Baclofen as needed for future cramps, can be helpful at night, caution sedation, ask Neurology about this option for future.  DUE for FASTING BLOOD WORK (no food or drink after midnight before the lab appointment, only water or coffee without cream/sugar on the morning of)  SCHEDULE "Lab Only" visit in the morning at the clinic for lab draw - 1 week before your 08/12/18 apt - you will need to notify the front desk (not the lab) that your orders need to be RELEASED by one of our nursing staff. The orders were placed on 02/24/18 - 3 tests = Complete Metabolic Panel = CBC = Vitamin D  Dr Tomi Likens will get the results - if we have to we can re order at that time.  DUE for FASTING BLOOD WORK (no food or drink after midnight before the lab appointment, only water or coffee without cream/sugar on the morning of)  SCHEDULE "Lab Only" visit in the morning at the clinic for lab draw in 9 MONTHS   - Make sure Lab Only appointment is at about 1 week before your next appointment, so that results will be available  For Lab Results, once available within 2-3 days of blood draw, you can can log in to MyChart online to view your results and a brief explanation. Also, we can discuss results at next  follow-up visit.   Please schedule a Follow-up Appointment to: Return in about 9 months (around 04/22/2019) for Yearly Medicare Checkup + [place].  If you have any other questions or concerns, please feel free to call the office or send a message through [place]. You may also schedule an earlier appointment if necessary.  Additionally, you may be receiving a survey about your experience at our office within a few days to 1 week by e-mail or mail. We value your feedback.  Nobie Putnam, DO [place]

## 2018-07-22 NOTE — Assessment & Plan Note (Signed)
Prior myalgia and muscle cramp due to statin

## 2018-07-22 NOTE — Assessment & Plan Note (Signed)
Stable without known recurrence Followed by [facility] Oncology

## 2018-07-22 NOTE — Assessment & Plan Note (Signed)
Well-controlled DM with A1c 5.9, stable from last No hyperglycemia. No hypoglycemia. 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 Continue current therapy - Metformin 500mg  daily 2. Encourage improved lifestyle - low carb, low sugar diet, reduce portion size, continue improving regular exercise 3. Check CBG if needed - she may need new glucometer, will send [place] name from medicare 4. Continue ACEi, - refused restart Statin 5. DM Foot exam done today / Advised to schedule DM ophtho exam, send record 6. Follow-up 9 months for yearly with labs

## 2018-07-22 NOTE — Assessment & Plan Note (Signed)
Chronic problem, stable Secondary to chemotherapy Less likely secondary to diabetes, but possible contribution Re-address at future visit if not improving

## 2018-07-22 NOTE — Assessment & Plan Note (Signed)
Refill Famotidine, given enough for BID dosing if preferred

## 2018-07-22 NOTE — Assessment & Plan Note (Signed)
Followed by [facility] Neurology Dr Tomi Likens Continue current therapy Next f/u 07/2018  I have located some future active orders CMET, CBC, Vitamin D in Epic for [facility] - she will return to our office for convenience approx 1 week before 08/12/18 to have fasting lab only - we will release these labs when she notifies [place] and the results will be sent to Dr Tomi Likens

## 2018-07-25 NOTE — Progress Notes (Signed)
[place]  [phone]  [phone]     CLEON Norris DOB: 1951-11-01  MR#: [id number]  YOM#:[id number]  Patient Care Team: [name], DO as PCP - General (Family Medicine) Christene Lye, MD (General Surgery) Forest Gleason, MD (Inactive) (Oncology) Pieter Partridge, DO as Consulting Physician (Neurology)  CHIEF COMPLAINT: Bilateral adenocarcinoma of the breast.  INTERVAL HISTORY: Patient returns to clinic today for routine six-month follow-up.  She is tolerating tamoxifen well without significant side effects.  She recently has initiated treatment for her underlying multiple sclerosis with improved pain control.  She continues to complain of leg cramping though. She has no neurologic complaints. She denies any recent fevers or illnesses. She has a good appetite and denies weight loss. She has no chest pain or shortness of breath. She denies any nausea, vomiting, constipation, or diarrhea. She has no urinary complaints.  Patient offers no further specific complaints today.  REVIEW OF SYSTEMS:   Review of Systems  Constitutional: Negative.  Negative for fever, malaise/fatigue and weight loss.  Respiratory: Negative.  Negative for cough and shortness of breath.   Cardiovascular: Negative.  Negative for chest pain and leg swelling.  Gastrointestinal: Negative.  Negative for abdominal pain.  Genitourinary: Negative.  Negative for dysuria.  Musculoskeletal: Negative for myalgias.  Skin: Negative.  Negative for rash.  Neurological: Negative.  Negative for dizziness, sensory change, focal weakness and weakness.  Psychiatric/Behavioral: Negative.  The patient is not nervous/anxious.     As per HPI. Otherwise, a complete review of systems is negative.  ONCOLOGY HISTORY: Oncology History   1. Bilateral carcinoma of breast, February, 2015. Right breast status post radical mastectomy. T1 C. N0M0 (isolated tumor cells in one lymph node)  multifocal invasive cancer. Stage IC, Left breast, Status post radical mastectomy, T2, N1, M0 tumor. Stage II, RIght breast. Both tumors are estrogen receptor positive.  Progesterone receptor positive.  HER-2 receptor negative by FISH. Negative for BRCA mutation.  2. Started on Adriamycin and Cytoxan on March 01, 2014. Last cycle of Cytoxan and Adriamycin on May 01, 2014. Patient has finished last chemotherapy on September 9. (12 th dose was omitted because of neuropathy) 3.  Taking tamoxifen.  Patient had a poor tolerance to letrozole.(October, 2015) 4.  Patient is taking letrozole since November of 2015 5.  May, 2016 Letrozole has been put on hold because of bony pains 6.  Started on Aromasin from July of 2016      History of bilateral breast cancer (Resolved)   11/29/2013 Initial Diagnosis    Breast cancer bilateral, multifocal ER/PR pos, Her 2 neg,.     PAST MEDICAL HISTORY: Past Medical History:  Diagnosis Date  . Anemia   . B12 deficiency 07/23/2016   Will check levels to determine if injections are needed again. Await results. Recent CBC at Onc WNL  . Cancer [facility]) 2014   right breast - Invasive Mammary Carcinoma  . Cancer [facility]) 2014   left breast - Invasive lobular carcinoma and DCIS   . Diffuse cystic mastopathy   . Essential hypertension, benign 09/24/2017  . Family history of malignant neoplasm of gastrointestinal tract 2012  . Fractured elbow 08/28/14  . Hx of metabolic acidosis with increased anion gap   . Increased heart rate   . Multiple sclerosis ([place])   . Neuromuscular disorder ([place])    neuropathy in bil feet - left is worse.  . Obesity, unspecified   . Peripheral neuropathy   . Personal  history of tobacco use, presenting hazards to health   . Restless leg syndrome   . Sleep apnea    uses CPAP  . Special screening for malignant neoplasms, colon     PAST SURGICAL HISTORY: Past Surgical History:  Procedure Laterality Date  . BREAST BIOPSY Right 1973,2001    . BREAST BIOPSY Right 11-07-13   INVASIVE MAMMARY CARCINOMA , ER/PR positive Her2 negative  . BREAST BIOPSY Left 11-27-13   invasive lobular and DCIS  . BREAST SURGERY Bilateral 01/09/14   mastectomy  . CHOLECYSTECTOMY    . COLONOSCOPY  2010   Dr. Jamal Collin  . COLONOSCOPY WITH PROPOFOL N/A 06/11/2015   Procedure: COLONOSCOPY WITH PROPOFOL;  Surgeon: Christene Lye, MD;  Location: [facility] ENDOSCOPY;  Service: Endoscopy;  Laterality: N/A;  . DILATATION & CURETTAGE/HYSTEROSCOPY WITH MYOSURE N/A 07/12/2018   Procedure: DILATATION & CURETTAGE/HYSTEROSCOPY WITH MYOSURE WITH ENDOMETRIAL POLYPECTOMY;  Surgeon: Will Bonnet, MD;  Location: [facility] ORS;  Service: Gynecology;  Laterality: N/A;  . DILATION AND CURETTAGE OF UTERUS  1983  . POLYPECTOMY  1989  . PORTA CATH REMOVAL    . PORTACATH PLACEMENT  2015  . SPINE SURGERY  09/14/14   Ruptured disk L4- L5  . TOTAL KNEE ARTHROPLASTY Left 11/22/2015   Procedure: TOTAL KNEE ARTHROPLASTY;  Surgeon: Earlie Server, MD;  Location: [place];  Service: Orthopedics;  Laterality: Left;  . TUBAL LIGATION    . WISDOM TOOTH EXTRACTION  2006    FAMILY HISTORY Family History  Problem Relation Age of Onset  . Cancer Mother        lung  . Cancer Father        colon, stomach  . Diabetes Maternal Grandmother   . Hypertension Sister   . Rectal cancer Paternal Uncle   . Rectal cancer Paternal Uncle     GYNECOLOGIC HISTORY:  Patient's last menstrual period was 12/22/1999 (approximate).     ADVANCED DIRECTIVES:    HEALTH MAINTENANCE: Social History   Tobacco Use  . Smoking status: Former Smoker    Packs/day: 1.00    Years: 30.00    Pack years: 30.00    Types: Cigarettes    Last attempt to quit: 12/11/2013    Years since quitting: 4.6  . Smokeless tobacco: Former Network engineer Use Topics  . Alcohol use: No    Alcohol/week: 0.0 standard drinks    Comment: social  . Drug use: No     Colonoscopy:  PAP:  Bone density:  Mammogram:  No  Known Allergies  Current Outpatient Medications  Medication Sig Dispense Refill  . acetaminophen (TYLENOL) 500 MG tablet Take 1,000 mg by mouth 2 (two) times daily as needed for moderate pain or headache.    Marland Kitchen BAYER CONTOUR NEXT TEST test strip 1 each by Other route daily.   3  . Calcium Carbonate-Vitamin D (CALCIUM 600+D PO) Take 1 tablet by mouth 2 (two) times daily.    . cholecalciferol (VITAMIN D) 1000 units tablet Take 6,000 Units by mouth daily.    . Cyanocobalamin (B-12) 5000 MCG SUBL Place 5,000 mcg under the tongue daily.    . Dimethyl Fumarate (TECFIDERA) 120 & 240 MG MISC Take 1 capsule by mouth as directed. One 120 mg cap BID for 7 days, then one 240 mg cap BID thereafter (Patient taking differently: Take 240 mg by mouth 2 (two) times daily. ) 60 each 3  . docusate sodium (COLACE) 100 MG capsule Take 100 mg by mouth at bedtime.    Marland Kitchen  famotidine (PEPCID) 20 MG tablet Take 1 tablet (20 mg total) by mouth 2 (two) times daily before a meal. 180 tablet 3  . fluticasone (FLONASE) 50 MCG/ACT nasal spray Place 2 sprays into both nostrils at bedtime. 16 g 12  . ibuprofen (ADVIL,MOTRIN) 600 MG tablet Take 1 tablet (600 mg total) by mouth every 6 (six) hours as needed for mild pain or cramping. 30 tablet 0  . lisinopril (PRINIVIL,ZESTRIL) 5 MG tablet Take 1 tablet (5 mg total) by mouth daily. (Patient taking differently: Take 5 mg by mouth every evening. ) 90 tablet 3  . metFORMIN (GLUCOPHAGE) 500 MG tablet Take 1 tablet (500 mg total) by mouth daily with breakfast. For first 2 weeks, just take 1 in the AM and then increase to BID 90 tablet 3  . MICROLET LANCETS MISC   3  . Omega-3 Fatty Acids (FISH OIL) 1000 MG CAPS Take 1,000 mg by mouth at bedtime.    . polyethylene glycol powder (GLYCOLAX/MIRALAX) powder Take 17 g by mouth 2 (two) times daily as needed for moderate constipation. 3350 g 1  . tamoxifen (NOLVADEX) 20 MG tablet TAKE 1 TABLET (20 MG TOTAL) BY MOUTH DAILY. 90 tablet 1  . traZODone  (DESYREL) 50 MG tablet TAKE 2 TABLETS AT BEDTIME (Patient taking differently: Take 100 mg by mouth at bedtime. ) 180 tablet 1   No current facility-administered medications for this visit.     OBJECTIVE: BP 135/68 (BP Location: Left Arm, Patient Position: Sitting)   Pulse 92   Temp (!) 97.4 F (36.3 C) (Tympanic)   Resp 18   Wt 196 lb 5 oz (89 kg)   LMP 12/22/1999 (Approximate)   BMI 32.67 kg/m    Body mass index is 32.67 kg/m.    ECOG FS:0 - Asymptomatic  General: Well-developed, well-nourished, no acute distress. Eyes: Pink conjunctiva, anicteric sclera. HEENT: Normocephalic, moist mucous membranes. Breast: Bilateral mastectomy.  Chest wall and axilla without evidence of recurrence. Lungs: Clear to auscultation bilaterally. Heart: Regular rate and rhythm. No rubs, murmurs, or gallops. Abdomen: Soft, nontender, nondistended. No organomegaly noted, normoactive bowel sounds. Musculoskeletal: No edema, cyanosis, or clubbing. Neuro: Alert, answering all questions appropriately. Cranial nerves grossly intact. Skin: No rashes or petechiae noted. Psych: Normal affect.  LAB RESULTS:  No visits with results within 3 Day(s) from this visit.  Latest known visit with results is:  Office Visit on 07/22/2018  Component Date Value Ref Range Status  . Hemoglobin A1C 07/22/2018 5.9* 4.0 - 5.6 % Final    STUDIES: No results found.  ASSESSMENT: Bilateral adenocarcinoma of the breast (right breast, stage I, T1c N0 M0 (isolated tumor cells in 1 lymph node; left breast, stage II, T2 N1 M0.)  PLAN:    1. Bilateral adenocarcinoma of the breast: Patient is status post bilateral mastectomy in February 2015. She also received adjuvant chemotherapy with Adriamycin, Cytoxan, and Taxol completing treatment in approximately October 2015. She then initiated letrozole, but could not tolerate secondary to leg pain and was switched to Aromasin.  Patient then switched to tamoxifen in October 2019 secondary  to cost.  She is tolerating this well without significant side effects.  Continue tamoxifen for a total of 5 years completing in October 2021. Patient does not require mammograms given her bilateral mastectomy.  Patient has requested less frequent follow-up, therefore she will return to clinic in 1 year for further evaluation.   2. Postmenopausal: Bone mineral density on August 01, 2016 reveald a T-score of -1.2.  Patient has been instructed to continue her calcium and vitamin D supplementation. Continue monitoring bone mineral density by PCP. 3.  Multiple sclerosis: Chronic.  Continue evaluation and treatment per neurology.  I spent a total of 20 minutes face-to-face with the patient of which greater than 50% of the visit was spent in counseling and coordination of care as detailed above.    Patient expressed understanding and was in agreement with this plan. She also understands that She can call clinic at any time with any questions, concerns, or complaints.   Breast cancer bilateral, multifocal ER/PR pos, Her 2 neg,.   Staging form: Breast, AJCC 7th Edition     Clinical: Stage IIB (T2, N1, M0) - Signed by Forest Gleason, MD on 04/22/2015   Lloyd Huger, MD   07/29/2018 12:27 PM

## 2018-07-26 ENCOUNTER — Inpatient Hospital Stay: Payer: Medicare Other | Attending: Oncology | Admitting: Oncology

## 2018-07-26 ENCOUNTER — Encounter: Payer: Self-pay | Admitting: Oncology

## 2018-07-26 VITALS — BP 135/68 | HR 92 | Temp 97.4°F | Resp 18 | Wt 196.3 lb

## 2018-07-26 DIAGNOSIS — Z79810 Long term (current) use of selective estrogen receptor modulators (SERMs): Secondary | ICD-10-CM

## 2018-07-26 DIAGNOSIS — G35 Multiple sclerosis: Secondary | ICD-10-CM

## 2018-07-26 DIAGNOSIS — Z87891 Personal history of nicotine dependence: Secondary | ICD-10-CM | POA: Diagnosis not present

## 2018-07-26 DIAGNOSIS — C50912 Malignant neoplasm of unspecified site of left female breast: Secondary | ICD-10-CM | POA: Diagnosis not present

## 2018-07-26 DIAGNOSIS — G2581 Restless legs syndrome: Secondary | ICD-10-CM | POA: Diagnosis not present

## 2018-07-26 DIAGNOSIS — Z79899 Other long term (current) drug therapy: Secondary | ICD-10-CM | POA: Diagnosis not present

## 2018-07-26 DIAGNOSIS — E669 Obesity, unspecified: Secondary | ICD-10-CM | POA: Diagnosis not present

## 2018-07-26 DIAGNOSIS — G629 Polyneuropathy, unspecified: Secondary | ICD-10-CM | POA: Insufficient documentation

## 2018-07-26 DIAGNOSIS — Z170 Estrogen receptor positive status [ER+]: Secondary | ICD-10-CM

## 2018-07-26 DIAGNOSIS — I10 Essential (primary) hypertension: Secondary | ICD-10-CM | POA: Diagnosis not present

## 2018-07-26 DIAGNOSIS — Z9013 Acquired absence of bilateral breasts and nipples: Secondary | ICD-10-CM

## 2018-07-26 DIAGNOSIS — C50911 Malignant neoplasm of unspecified site of right female breast: Secondary | ICD-10-CM

## 2018-07-26 DIAGNOSIS — Z7984 Long term (current) use of oral hypoglycemic drugs: Secondary | ICD-10-CM | POA: Insufficient documentation

## 2018-07-26 DIAGNOSIS — G4730 Sleep apnea, unspecified: Secondary | ICD-10-CM | POA: Diagnosis not present

## 2018-07-26 DIAGNOSIS — Z853 Personal history of malignant neoplasm of breast: Secondary | ICD-10-CM

## 2018-07-26 NOTE — Progress Notes (Signed)
Pt in for follow up re[ports "cramps an pain in feet and legs".

## 2018-07-28 ENCOUNTER — Ambulatory Visit: Payer: Medicare Other | Admitting: Oncology

## 2018-07-29 ENCOUNTER — Encounter: Payer: Self-pay | Admitting: Obstetrics and Gynecology

## 2018-07-29 ENCOUNTER — Ambulatory Visit (INDEPENDENT_AMBULATORY_CARE_PROVIDER_SITE_OTHER): Payer: Medicare Other | Admitting: Obstetrics and Gynecology

## 2018-07-29 VITALS — BP 132/88 | Ht 65.0 in | Wt 198.0 lb

## 2018-07-29 DIAGNOSIS — N950 Postmenopausal bleeding: Secondary | ICD-10-CM | POA: Diagnosis not present

## 2018-07-29 DIAGNOSIS — N840 Polyp of corpus uteri: Secondary | ICD-10-CM | POA: Diagnosis not present

## 2018-07-29 DIAGNOSIS — Z09 Encounter for follow-up examination after completed treatment for conditions other than malignant neoplasm: Secondary | ICD-10-CM | POA: Diagnosis not present

## 2018-07-29 NOTE — Progress Notes (Signed)
   Postoperative Follow-up Patient presents post op from hysteroscopy, dilation and curettage, endometrial polypectomy 2 weeks ago for postmenopausal bleeding and endometrial polyp.  Subjective: Patient reports marked improvement in her preop symptoms. Eating a regular diet without difficulty. The patient is not having any pain.  Activity: normal activities of daily living.  Objective: Vitals:   07/29/18 0759  BP: 132/88   Vital Signs: BP 132/88   Ht 5\' 5"  (1.651 m)   Wt 198 lb (89.8 kg)   LMP 12/22/1999 (Approximate)   BMI 32.95 kg/m  Constitutional: Well nourished, well developed female in no acute distress.  HEENT: normal Skin: Warm and dry.  Extremity: no edema  Abdomen: Soft, non-tender, normal bowel sounds; no bruits, organomegaly or masses.  Assessment: 67 y.o. s/p hysteroscopy, dilation and curettage, and polypectomy progressing well  Plan: Patient has done well after surgery with no apparent complications.  I have discussed the post-operative course to date, and the expected progress moving forward.  The patient understands what complications to be concerned about.  I will see the patient in routine follow up, or sooner if needed.    Activity plan: No restriction.  Reviewed pathology.  Specimens were benign.   Follow up prn.   Prentice Docker, MD 07/29/2018, 8:16 AM

## 2018-08-01 DIAGNOSIS — Z96652 Presence of left artificial knee joint: Secondary | ICD-10-CM | POA: Diagnosis not present

## 2018-08-05 ENCOUNTER — Other Ambulatory Visit: Payer: Medicare Other

## 2018-08-05 DIAGNOSIS — E559 Vitamin D deficiency, unspecified: Secondary | ICD-10-CM

## 2018-08-05 DIAGNOSIS — G35 Multiple sclerosis: Secondary | ICD-10-CM

## 2018-08-08 LAB — CBC WITH DIFFERENTIAL/PLATELET
Basophils Absolute: 38 cells/uL (ref 0–200)
Basophils Relative: 0.6 %
EOS PCT: 2.1 %
Eosinophils Absolute: 132 cells/uL (ref 15–500)
HEMATOCRIT: 34.5 % — AB (ref 35.0–45.0)
HEMOGLOBIN: 11.6 g/dL — AB (ref 11.7–15.5)
LYMPHS ABS: 1859 {cells}/uL (ref 850–3900)
MCH: 31.9 pg (ref 27.0–33.0)
MCHC: 33.6 g/dL (ref 32.0–36.0)
MCV: 94.8 fL (ref 80.0–100.0)
MPV: 10.8 fL (ref 7.5–12.5)
Monocytes Relative: 11.2 %
NEUTROS ABS: 3566 {cells}/uL (ref 1500–7800)
Neutrophils Relative %: 56.6 %
Platelets: 196 10*3/uL (ref 140–400)
RBC: 3.64 10*6/uL — AB (ref 3.80–5.10)
RDW: 11.8 % (ref 11.0–15.0)
Total Lymphocyte: 29.5 %
WBC mixed population: 706 cells/uL (ref 200–950)
WBC: 6.3 10*3/uL (ref 3.8–10.8)

## 2018-08-08 LAB — HEPATIC FUNCTION PANEL
AG RATIO: 1.6 (calc) (ref 1.0–2.5)
ALT: 24 U/L (ref 6–29)
AST: 22 U/L (ref 10–35)
Albumin: 4.1 g/dL (ref 3.6–5.1)
Alkaline phosphatase (APISO): 44 U/L (ref 33–130)
Bilirubin, Direct: 0.1 mg/dL (ref 0.0–0.2)
GLOBULIN: 2.6 g/dL (ref 1.9–3.7)
Indirect Bilirubin: 0.3 mg/dL (calc) (ref 0.2–1.2)
TOTAL PROTEIN: 6.7 g/dL (ref 6.1–8.1)
Total Bilirubin: 0.4 mg/dL (ref 0.2–1.2)

## 2018-08-08 LAB — VITAMIN D 1,25 DIHYDROXY
VITAMIN D 1, 25 (OH) TOTAL: 14 pg/mL — AB (ref 18–72)
Vitamin D2 1, 25 (OH)2: <8 pg/mL
Vitamin D3 1, 25 (OH)2: 14 pg/mL

## 2018-08-09 ENCOUNTER — Other Ambulatory Visit: Payer: Self-pay | Admitting: Nurse Practitioner

## 2018-08-09 DIAGNOSIS — E1169 Type 2 diabetes mellitus with other specified complication: Secondary | ICD-10-CM

## 2018-08-11 ENCOUNTER — Other Ambulatory Visit: Payer: Self-pay | Admitting: Family Medicine

## 2018-08-11 ENCOUNTER — Other Ambulatory Visit: Payer: Self-pay

## 2018-08-11 DIAGNOSIS — G2581 Restless legs syndrome: Secondary | ICD-10-CM

## 2018-08-11 DIAGNOSIS — M5416 Radiculopathy, lumbar region: Secondary | ICD-10-CM

## 2018-08-11 DIAGNOSIS — E785 Hyperlipidemia, unspecified: Secondary | ICD-10-CM

## 2018-08-11 DIAGNOSIS — T451X5A Adverse effect of antineoplastic and immunosuppressive drugs, initial encounter: Principal | ICD-10-CM

## 2018-08-11 DIAGNOSIS — E1169 Type 2 diabetes mellitus with other specified complication: Secondary | ICD-10-CM

## 2018-08-11 DIAGNOSIS — G35 Multiple sclerosis: Secondary | ICD-10-CM

## 2018-08-11 DIAGNOSIS — Z8639 Personal history of other endocrine, nutritional and metabolic disease: Secondary | ICD-10-CM

## 2018-08-11 DIAGNOSIS — F5101 Primary insomnia: Secondary | ICD-10-CM

## 2018-08-11 DIAGNOSIS — G620 Drug-induced polyneuropathy: Secondary | ICD-10-CM

## 2018-08-11 MED ORDER — MELOXICAM 7.5 MG PO TABS: 7.5000 mg | ORAL_TABLET | Freq: Every day | ORAL | 0 refills | Status: DC | PRN

## 2018-08-11 NOTE — Progress Notes (Signed)
NEUROLOGY FOLLOW UP OFFICE NOTE  Cassidy Norris [id number]  HISTORY OF PRESENT ILLNESS: Cassidy Norris is a 67 year old right-handed woman with multiple sclerosis, former smoker and history of bilateral breast cancer 2015 s/p bilateral mastectomy and s/p L4-5 laminectomy who follows up for multiple sclerosis.  She is accompanied by her sister who supplements history.    UPDATE: Repeat MRI of brain with and without contrast from 01/25/18 was personally reviewed and showed stable chronic white matter hyperintensities consistent with MS. After considering several DMTs, she started Tecfidera in April.  Since starting Tecfidera, she reports cramping in the legs.  She takes vinegar at night, which helps.  She is taking D3 6000 IU daily. 08/05/18 LABS:  CBC with WBC 6.3, HGB 11.6, HCT 34.5, MCV 94.8, PLT 196, ALC 1,859 cells/uL; hepatic panel with t bili 0.4, ALP 44, AST 22, ALT 24; vitamin D level 14.  Vision:  No change Motor:  No issues Sensory:  Neuropathy in feet, stable Pain:  Since starting Tecfidera, she reports muscle cramps in the legs.  Pain in shins and knees (s/p left knee replacement and arthritis in right knee) Gait:  okay Bowel/Bladder:  no Fatigue:  No Cognition:  Stable Mood:  Okay  HISTORY: In November 2001, she tripped and fell, hitting her head. She went to the ED where a CT of the head was reportedly unremarkable. She was advised to have an MRI of the brain, which was performed on 12/12/00. It revealed small round and elliptical white matter lesions less than 1 cm in the periventricular region. In December of 2002, she followed up with a neurologist, Dr. Corinna Capra. VEP performed in 2003 showed prolonged p100 latency in the right optic nerve. BAER was normal. She reportedly had a lumbar puncture where CSF results were supportive of MS. She was given 5 days of Solu-Medrol followed by 3 weeks of oral prednisone. She was advised to start an interferon, but due to  possible side effects, she decided to hold off and see if she clinically progresses. She did have a follow up MRI of the brain in December 2004, which reportedly showed an enhancing lesion as well as a remote lesion in the cerebellum in addition to the periventricular white matter lesions. She never had any noticeable clinical flare-ups and had done well off of a disease-modifying agent. Over the past few years, she notes possible worsening in depth perception. Sometimes she notes difficulty grabbing for objects or will drive too close to the curb.  She had an MRI of the brain and cervical spine with and without contrast on 03/12/15, which showed non-enhancing bilateral periventricular white matter lesions oriented perpendicular to the ventricles and corpus callosum, as well as involving temporal lobe white matter and mild involvement of the left side of cerebellum. No cervical cord lesions were seen.  Repeat MRI of brain with and without contrast from 01/28/17 was personally reviewed and demonstrated "two small new or increased right hemisphere subcortical white matter lesions when compared to 2016" with no enhancement or active demyelination.  Otherwise, MRI is stable.  In 2015, she was diagnosed with bilateral breast cancer. She underwent bilateral mastectomy and chemotherapy, which ended in September. As a result of the chemotherapy, she has neuropathy in the feet. She also had problems with her left leg and was found to have a ruptured disc at L4-5 and underwent surgery in October 2015. She has baseline numbness over the dorsum of left foot and lateral leg.  The  toes in her left foot sometimes curl.  Since chemotherapy, she notes some mild memory problems. She sometimes misplaces objects or will forget if she locked the front door when she left. She will sometimes have difficulty getting a word out.  PAST MEDICAL HISTORY: Past Medical History:  Diagnosis Date  . Anemia   . B12 deficiency  07/23/2016   Will check levels to determine if injections are needed again. Await results. Recent CBC at Onc WNL  . Cancer [facility]) 2014   right breast - Invasive Mammary Carcinoma  . Cancer [facility]) 2014   left breast - Invasive lobular carcinoma and DCIS   . Diffuse cystic mastopathy   . Essential hypertension, benign 09/24/2017  . Family history of malignant neoplasm of gastrointestinal tract 2012  . Fractured elbow 08/28/14  . Hx of metabolic acidosis with increased anion gap   . Increased heart rate   . Multiple sclerosis ([place])   . Neuromuscular disorder ([place])    neuropathy in bil feet - left is worse.  . Obesity, unspecified   . Peripheral neuropathy   . Personal history of tobacco use, presenting hazards to health   . Restless leg syndrome   . Sleep apnea    uses CPAP  . Special screening for malignant neoplasms, colon     MEDICATIONS: Current Outpatient Medications on File Prior to Visit  Medication Sig Dispense Refill  . acetaminophen (TYLENOL) 500 MG tablet Take 1,000 mg by mouth 2 (two) times daily as needed for moderate pain or headache.    Marland Kitchen BAYER CONTOUR NEXT TEST test strip 1 each by Other route daily.   3  . Calcium Carbonate-Vitamin D (CALCIUM 600+D PO) Take 1 tablet by mouth 2 (two) times daily.    . cholecalciferol (VITAMIN D) 1000 units tablet Take 6,000 Units by mouth daily.    . Cyanocobalamin (B-12) 5000 MCG SUBL Place 5,000 mcg under the tongue daily.    . Dimethyl Fumarate (TECFIDERA) 120 & 240 MG MISC Take 1 capsule by mouth as directed. One 120 mg cap BID for 7 days, then one 240 mg cap BID thereafter (Patient taking differently: Take 240 mg by mouth 2 (two) times daily. ) 60 each 3  . docusate sodium (COLACE) 100 MG capsule Take 100 mg by mouth at bedtime.    . famotidine (PEPCID) 20 MG tablet Take 1 tablet (20 mg total) by mouth 2 (two) times daily before a meal. 180 tablet 3  . fluticasone (FLONASE) 50 MCG/ACT nasal spray Place 2 sprays into both nostrils at  bedtime. 16 g 12  . ibuprofen (ADVIL,MOTRIN) 600 MG tablet Take 1 tablet (600 mg total) by mouth every 6 (six) hours as needed for mild pain or cramping. 30 tablet 0  . lisinopril (PRINIVIL,ZESTRIL) 5 MG tablet Take 1 tablet (5 mg total) by mouth daily. (Patient taking differently: Take 5 mg by mouth every evening. ) 90 tablet 3  . metFORMIN (GLUCOPHAGE) 500 MG tablet TAKE 1 TABLET DAILY WITH BREAKFAST. FOR FIRST 2 WEEKS, TAKE 1 TAB IN THE MORNING AND THEN INCREASE TO 1 TAB TWICE DAILY 180 tablet 0  . MICROLET LANCETS MISC   3  . Omega-3 Fatty Acids (FISH OIL) 1000 MG CAPS Take 1,000 mg by mouth at bedtime.    . polyethylene glycol powder (GLYCOLAX/MIRALAX) powder Take 17 g by mouth 2 (two) times daily as needed for moderate constipation. 3350 g 1  . tamoxifen (NOLVADEX) 20 MG tablet TAKE 1 TABLET (20 MG TOTAL) BY  MOUTH DAILY. 90 tablet 1  . traZODone (DESYREL) 50 MG tablet TAKE 2 TABLETS AT BEDTIME (Patient taking differently: Take 100 mg by mouth at bedtime. ) 180 tablet 1   No current facility-administered medications on file prior to visit.     ALLERGIES: No Known Allergies  FAMILY HISTORY: Family History  Problem Relation Age of Onset  . Cancer Mother        lung  . Cancer Father        colon, stomach  . Diabetes Maternal Grandmother   . Hypertension Sister   . Rectal cancer Paternal Uncle   . Rectal cancer Paternal Uncle    SOCIAL HISTORY: Social History   Socioeconomic History  . Marital status: Widowed    Spouse name: Not on file  . Number of children: Not on file  . Years of education: Not on file  . Highest education level: Not on file  Occupational History  . Not on file  Social Needs  . Financial resource strain: Not on file  . Food insecurity:    Worry: Not on file    Inability: Not on file  . Transportation needs:    Medical: Not on file    Non-medical: Not on file  Tobacco Use  . Smoking status: Former Smoker    Packs/day: 1.00    Years: 30.00    Pack  years: 30.00    Types: Cigarettes    Last attempt to quit: 12/11/2013    Years since quitting: 4.6  . Smokeless tobacco: Former Network engineer and Sexual Activity  . Alcohol use: No    Alcohol/week: 0.0 standard drinks    Comment: social  . Drug use: No  . Sexual activity: Never  Lifestyle  . Physical activity:    Days per week: Not on file    Minutes per session: Not on file  . Stress: Not on file  Relationships  . Social connections:    Talks on phone: Not on file    Gets together: Not on file    Attends religious service: Not on file    Active member of club or organization: Not on file    Attends meetings of clubs or organizations: Not on file    Relationship status: Not on file  . Intimate partner violence:    Fear of current or ex partner: Not on file    Emotionally abused: Not on file    Physically abused: Not on file    Forced sexual activity: Not on file  Other Topics Concern  . Not on file  Social History Narrative  . Not on file    REVIEW OF SYSTEMS: Constitutional: No fevers, chills, or sweats, no generalized fatigue, change in appetite Eyes: No visual changes, double vision, eye pain Ear, nose and throat: No hearing loss, ear pain, nasal congestion, sore throat Cardiovascular: No chest pain, palpitations Respiratory:  No shortness of breath at rest or with exertion, wheezes GastrointestinaI: No nausea, vomiting, diarrhea, abdominal pain, fecal incontinence Genitourinary:  No dysuria, urinary retention or frequency Musculoskeletal:  Muscle cramps in legs Integumentary: No rash, pruritus, skin lesions Neurological: as above Psychiatric: No depression, insomnia, anxiety Endocrine: No palpitations, fatigue, diaphoresis, mood swings, change in appetite, change in weight, increased thirst Hematologic/Lymphatic:  No purpura, petechiae. Allergic/Immunologic: no itchy/runny eyes, nasal congestion, recent allergic reactions, rashes  PHYSICAL EXAM: Blood pressure  122/64, pulse 87, height 5\' 5"  (1.651 m), weight 194 lb (88 kg), last menstrual period 12/22/1999, SpO2 98 %.  General: No acute distress.  Patient appears well-groomed.  Head:  Normocephalic/atraumatic Eyes:  Fundi examined but not visualized Neck: supple, no paraspinal tenderness, full range of motion Heart:  Regular rate and rhythm Lungs:  Clear to auscultation bilaterally Back: No paraspinal tenderness Neurological Exam: alert and oriented to person, place, and time. Attention span and concentration intact, recent and remote memory intact, fund of knowledge intact.  Speech fluent and not dysarthric, language intact.  CN II-XII intact. Bulk and tone normal, muscle strength 5/5 throughout.  Sensation to temperature and vibration reduced in feet.  Deep tendon reflexes trace in upper extremities and absent in lower extremities, toes downgoing.  Finger to nose and heel to shin testing intact.  Gait normal.  Timed 25 foot walk 4.41 seconds. Romberg negative.  IMPRESSION: Multiple sclerosis.   Muscle cramps.  Not a typical side effect of Tecfidera if at all. Vitamin D deficiency.  She states she has remained compliant on D3 6000 IU daily  PLAN: 1.  Continue Tecfidera 2.  Increase D3 to 10,000 IU daily 3.  For muscle cramps, will start baclofen 10mg  up to three times daily.  Will also check magnesium level. 4.  Repeat CBC with diff and vitamin D level in 6 months (prior to follow up) 5.  Follow up in 6 months.  25 minutes spent face to face with patient, over 50% spent discussing management.  Metta Clines, DO  CC: Nobie Putnam, DO

## 2018-08-12 ENCOUNTER — Encounter: Payer: Self-pay | Admitting: Neurology

## 2018-08-12 ENCOUNTER — Other Ambulatory Visit (INDEPENDENT_AMBULATORY_CARE_PROVIDER_SITE_OTHER): Payer: Medicare Other

## 2018-08-12 ENCOUNTER — Ambulatory Visit (INDEPENDENT_AMBULATORY_CARE_PROVIDER_SITE_OTHER): Payer: Medicare Other | Admitting: Neurology

## 2018-08-12 VITALS — BP 122/64 | HR 87 | Ht 65.0 in | Wt 194.0 lb

## 2018-08-12 DIAGNOSIS — R6889 Other general symptoms and signs: Secondary | ICD-10-CM | POA: Diagnosis not present

## 2018-08-12 DIAGNOSIS — E559 Vitamin D deficiency, unspecified: Secondary | ICD-10-CM

## 2018-08-12 DIAGNOSIS — G35 Multiple sclerosis: Secondary | ICD-10-CM

## 2018-08-12 DIAGNOSIS — R252 Cramp and spasm: Secondary | ICD-10-CM | POA: Diagnosis not present

## 2018-08-12 DIAGNOSIS — Z79899 Other long term (current) drug therapy: Secondary | ICD-10-CM

## 2018-08-12 LAB — CBC WITH DIFFERENTIAL/PLATELET
BASOS ABS: 0 10*3/uL (ref 0.0–0.1)
Basophils Relative: 0.5 % (ref 0.0–3.0)
Eosinophils Absolute: 0.1 10*3/uL (ref 0.0–0.7)
Eosinophils Relative: 1.4 % (ref 0.0–5.0)
HCT: 34.7 % — ABNORMAL LOW (ref 36.0–46.0)
Hemoglobin: 11.8 g/dL — ABNORMAL LOW (ref 12.0–15.0)
Lymphocytes Relative: 21.2 % (ref 12.0–46.0)
Lymphs Abs: 1.3 10*3/uL (ref 0.7–4.0)
MCHC: 33.8 g/dL (ref 30.0–36.0)
MCV: 93.6 fl (ref 78.0–100.0)
Monocytes Absolute: 0.6 10*3/uL (ref 0.1–1.0)
Monocytes Relative: 9.5 % (ref 3.0–12.0)
NEUTROS ABS: 4 10*3/uL (ref 1.4–7.7)
Neutrophils Relative %: 67.4 % (ref 43.0–77.0)
PLATELETS: 174 10*3/uL (ref 150.0–400.0)
RBC: 3.71 Mil/uL — ABNORMAL LOW (ref 3.87–5.11)
RDW: 12.9 % (ref 11.5–15.5)
WBC: 6 10*3/uL (ref 4.0–10.5)

## 2018-08-12 LAB — VITAMIN D 25 HYDROXY (VIT D DEFICIENCY, FRACTURES)

## 2018-08-12 LAB — MAGNESIUM: MAGNESIUM: 2.1 mg/dL (ref 1.5–2.5)

## 2018-08-12 MED ORDER — BACLOFEN 10 MG PO TABS: 10.0000 mg | ORAL_TABLET | Freq: Three times a day (TID) | ORAL | 1 refills | Status: DC | PRN

## 2018-08-12 NOTE — Patient Instructions (Addendum)
1.  Continue Tecfidera.  2.  For muscle cramps, take baclofen 10mg  three times daily.  You may try taking as needed first or around the clock. 3.  Increase D3 to 10,000 IU daily 4.  Repeat CBC with diff and vitamin D in 6 months, magnesium today. 5.  Follow up in 6 months.  Your provider has requested that you have labwork completed today. Please go to [facility] ([id number]) on the second floor of this building before leaving the office today. You do not need to check in. If you are not called within 15 minutes please check with the front desk.

## 2018-08-15 ENCOUNTER — Telehealth: Payer: Self-pay | Admitting: Neurology

## 2018-08-15 NOTE — Telephone Encounter (Signed)
Mychart message sent to patient.

## 2018-08-15 NOTE — Telephone Encounter (Signed)
-----   Message from Pieter Partridge, DO sent at 08/15/2018  7:05 AM EDT ----- Vitamin D level was rechecked and is elevated now.  I would like her to STOP D3 supplement and repeat level in 3 months.

## 2018-08-16 ENCOUNTER — Telehealth: Payer: Self-pay | Admitting: Neurology

## 2018-08-16 NOTE — Telephone Encounter (Signed)
Stop the vitamin D.  We rechecked it and it is elevated.

## 2018-08-16 NOTE — Telephone Encounter (Signed)
Called and LMOVM advising Pt the results we just rcvd, her Vit D is elevated, Dr Tomi Likens said to stop Vit D

## 2018-08-16 NOTE — Telephone Encounter (Signed)
Patient states on the VM she left that Luvenia Starch had called her and told her to stop the Vitamin D but he told her to up the dosage so she wants to make sure which is correct please call

## 2018-08-17 ENCOUNTER — Other Ambulatory Visit: Payer: Self-pay

## 2018-08-17 DIAGNOSIS — E1169 Type 2 diabetes mellitus with other specified complication: Secondary | ICD-10-CM

## 2018-08-17 DIAGNOSIS — M170 Bilateral primary osteoarthritis of knee: Secondary | ICD-10-CM

## 2018-08-17 MED ORDER — MELOXICAM 7.5 MG PO TABS: 7.5000 mg | ORAL_TABLET | Freq: Every day | ORAL | 0 refills | Status: DC | PRN

## 2018-08-17 MED ORDER — METFORMIN HCL 500 MG PO TABS: 500.0000 mg | ORAL_TABLET | Freq: Two times a day (BID) | ORAL | 1 refills | Status: DC

## 2018-08-19 ENCOUNTER — Ambulatory Visit: Payer: Medicare Other | Admitting: Neurology

## 2018-10-11 ENCOUNTER — Ambulatory Visit: Payer: Self-pay | Admitting: General Surgery

## 2018-10-12 ENCOUNTER — Other Ambulatory Visit: Payer: Self-pay | Admitting: Nurse Practitioner

## 2018-10-12 DIAGNOSIS — E1169 Type 2 diabetes mellitus with other specified complication: Secondary | ICD-10-CM

## 2018-10-13 ENCOUNTER — Ambulatory Visit (INDEPENDENT_AMBULATORY_CARE_PROVIDER_SITE_OTHER): Payer: Medicare Other | Admitting: General Surgery

## 2018-10-13 ENCOUNTER — Encounter: Payer: Self-pay | Admitting: General Surgery

## 2018-10-13 ENCOUNTER — Other Ambulatory Visit: Payer: Self-pay

## 2018-10-13 VITALS — BP 137/68 | HR 90 | Temp 97.9°F | Resp 14 | Ht 65.0 in | Wt 188.0 lb

## 2018-10-13 DIAGNOSIS — C50411 Malignant neoplasm of upper-outer quadrant of right female breast: Secondary | ICD-10-CM

## 2018-10-13 DIAGNOSIS — C50212 Malignant neoplasm of upper-inner quadrant of left female breast: Secondary | ICD-10-CM | POA: Diagnosis not present

## 2018-10-13 DIAGNOSIS — Z170 Estrogen receptor positive status [ER+]: Secondary | ICD-10-CM

## 2018-10-13 DIAGNOSIS — Z780 Asymptomatic menopausal state: Secondary | ICD-10-CM

## 2018-10-13 NOTE — Progress Notes (Signed)
Patient ID: Cassidy Norris, female   DOB: 04/30/1951, 67 y.o.   MRN: [id number]  Chief Complaint  Patient presents with  . Breast Cancer    HPI Cassidy Norris is a 67 y.o. female who presents for a breast cancer check up. Patient states she is doing well. Bilateral mastectomy done in 2015.   HPI  Past Medical History:  Diagnosis Date  . Anemia   . B12 deficiency 07/23/2016   Will check levels to determine if injections are needed again. Await results. Recent CBC at Onc WNL  . Cancer ([place]) 12/30/2013   Multifocal disease: pT1c,m, N0(isolated tumor cells) Er/ PR positive, Her 2 negative.  . Cancer ([place]) 01/09/2014   Invasive lobular carcinoma, 2.2 cm, T2,N1 ER/PR positive, HER-2/neu negative  . Diffuse cystic mastopathy   . Essential hypertension, benign 09/24/2017  . Family history of malignant neoplasm of gastrointestinal tract 2012  . Fractured elbow 08/28/14  . Hx of metabolic acidosis with increased anion gap   . Increased heart rate   . Multiple sclerosis ([place])   . Neuromuscular disorder ([place])    neuropathy in bil feet - left is worse.  . Obesity, unspecified   . Peripheral neuropathy   . Personal history of tobacco use, presenting hazards to health   . Restless leg syndrome   . Sleep apnea    uses CPAP  . Special screening for malignant neoplasms, colon     Past Surgical History:  Procedure Laterality Date  . BREAST BIOPSY Right 1973,2001  . BREAST BIOPSY Right 11-07-13   INVASIVE MAMMARY CARCINOMA , ER/PR positive Her2 negative  . BREAST BIOPSY Left 11-27-13   invasive lobular and DCIS  . BREAST SURGERY Bilateral 01/09/14   mastectomy  . CHOLECYSTECTOMY    . COLONOSCOPY  2010   Dr. Jamal Collin  . COLONOSCOPY WITH PROPOFOL N/A 06/11/2015   Procedure: COLONOSCOPY WITH PROPOFOL;  Surgeon: Christene Lye, MD;  Location: [facility];  Service: Endoscopy;  Laterality: N/A;  . DILATATION & CURETTAGE/HYSTEROSCOPY WITH MYOSURE N/A 07/12/2018   Procedure: DILATATION  & CURETTAGE/HYSTEROSCOPY WITH MYOSURE WITH ENDOMETRIAL POLYPECTOMY;  Surgeon: Will Bonnet, MD;  Location: [facility] ORS;  Service: Gynecology;  Laterality: N/A;  . DILATION AND CURETTAGE OF UTERUS  1983  . POLYPECTOMY  1989  . PORTA CATH REMOVAL    . PORTACATH PLACEMENT  2015  . SPINE SURGERY  09/14/14   Ruptured disk L4- L5  . TOTAL KNEE ARTHROPLASTY Left 11/22/2015   Procedure: TOTAL KNEE ARTHROPLASTY;  Surgeon: Earlie Server, MD;  Location: [facility];  Service: Orthopedics;  Laterality: Left;  . TUBAL LIGATION    . WISDOM TOOTH EXTRACTION  2006    Family History  Problem Relation Age of Onset  . Cancer Mother        lung  . Cancer Father        colon, stomach  . Diabetes Maternal Grandmother   . Hypertension Sister   . Rectal cancer Paternal Uncle   . Rectal cancer Paternal Uncle     Social History Social History   Tobacco Use  . Smoking status: Former Smoker    Packs/day: 1.00    Years: 30.00    Pack years: 30.00    Types: Cigarettes    Last attempt to quit: 12/11/2013    Years since quitting: 4.8  . Smokeless tobacco: Former Network engineer Use Topics  . Alcohol use: No    Alcohol/week: 0.0 standard drinks    Comment: social  .  Drug use: No    No Known Allergies  Current Outpatient Medications  Medication Sig Dispense Refill  . acetaminophen (TYLENOL) 500 MG tablet Take 1,000 mg by mouth 2 (two) times daily as needed for moderate pain or headache.    . baclofen (LIORESAL) 10 MG tablet Take 1 tablet (10 mg total) by mouth 3 (three) times daily as needed for muscle spasms. 270 each 1  . BAYER CONTOUR NEXT TEST test strip 1 each by Other route daily.   3  . Calcium Carbonate-Vitamin D (CALCIUM 600+D PO) Take 1 tablet by mouth 2 (two) times daily.    . Cyanocobalamin (B-12) 5000 MCG SUBL Place 5,000 mcg under the tongue daily.    . Dimethyl Fumarate (TECFIDERA) 120 & 240 MG MISC Take 1 capsule by mouth as directed. One 120 mg cap BID for 7 days, then one 240 mg cap  BID thereafter (Patient taking differently: Take 240 mg by mouth 2 (two) times daily. ) 60 each 3  . famotidine (PEPCID) 20 MG tablet Take 1 tablet (20 mg total) by mouth 2 (two) times daily before a meal. 180 tablet 3  . fluticasone (FLONASE) 50 MCG/ACT nasal spray Place 2 sprays into both nostrils at bedtime. 16 g 12  . lisinopril (PRINIVIL,ZESTRIL) 5 MG tablet Take 1 tablet (5 mg total) by mouth daily. (Patient taking differently: Take 5 mg by mouth every evening. ) 90 tablet 3  . meloxicam (MOBIC) 7.5 MG tablet Take 1 tablet (7.5 mg total) by mouth daily as needed for pain. 90 tablet 0  . metFORMIN (GLUCOPHAGE) 500 MG tablet Take 1 tablet (500 mg total) by mouth 2 (two) times daily with a meal. 180 tablet 0  . MICROLET LANCETS MISC   3  . Omega-3 Fatty Acids (FISH OIL) 1000 MG CAPS Take 1,000 mg by mouth at bedtime.    . polyethylene glycol powder (GLYCOLAX/MIRALAX) powder Take 17 g by mouth 2 (two) times daily as needed for moderate constipation. 3350 g 1  . tamoxifen (NOLVADEX) 20 MG tablet TAKE 1 TABLET (20 MG TOTAL) BY MOUTH DAILY. 90 tablet 1  . traZODone (DESYREL) 50 MG tablet TAKE 2 TABLETS AT BEDTIME (Patient taking differently: Take 100 mg by mouth at bedtime. ) 180 tablet 1  . vitamin E 1000 UNIT capsule Take 1,000 Units by mouth daily.    . cholecalciferol (VITAMIN D) 1000 units tablet Take 6,000 Units by mouth daily.     No current facility-administered medications for this visit.     Review of Systems Review of Systems  Constitutional: Negative.   Respiratory: Negative.   Cardiovascular: Negative.     Blood pressure 137/68, pulse 90, temperature 97.9 F (36.6 C), temperature source Skin, resp. rate 14, height 5' 5"  (1.651 m), weight 188 lb (85.3 kg), last menstrual period 12/22/1999.  Physical Exam Physical Exam  Constitutional: She is oriented to person, place, and time. She appears well-developed and well-nourished.  Eyes: Conjunctivae are normal. No scleral icterus.   Neck: Neck supple.  Cardiovascular: Normal rate, regular rhythm and normal heart sounds.  Pulmonary/Chest: Effort normal and breath sounds normal.  Bilateral mastectomy sites are clean and well healed.     Lymphadenopathy:    She has no cervical adenopathy.    She has no axillary adenopathy.       Right: No supraclavicular adenopathy present.       Left: No supraclavicular adenopathy present.  Neurological: She is alert and oriented to person, place, and time.  Skin: Skin is warm and dry.    Data Reviewed Medical oncology notes of September 2019 reviewed.  Patient switched to tamoxifen in 2018 secondary to cost. [facility]ology from [facility] and excision of endometrial polyp for vaginal bleeding showed no evidence of atypia or malignancy.  (Patient had a history of intermittent vaginal bleeding post menopause prior to initiation of tamoxifen) C. Bone density dated August 18, 2016 showed osteopenia.  Patient reports making use of calcium with vitamin D.  Assessment    No evidence of recurrent cancer.    Plan   Tale miralax daily and may try glycerin suppositories  Patient needs a bone density and this will be scheduled in the near future.    Opportunity for excision of redundant tissue offered, declined.  Patient is not making use of prostheses. Return in one year breast cancer check up. The patient is aware to call back for any questions or concerns.   HPI, Physical Exam, Assessment and Plan have been scribed under the direction and in the presence of Hervey Ard, MD.  Gaspar Cola, CMA  The patient is scheduled for a bone density test at [facility] on 11/30/18 at 9:40 am. Documented by Lesly Rubenstein LPN  I have completed the exam and reviewed the above documentation for accuracy and completeness.  I agree with the above.  Haematologist has been used and any errors in dictation or transcription are unintentional.  Hervey Ard, M.D., F.A.C.S.  Cassidy Norris  Cassidy Norris 10/13/2018, 9:23 AM

## 2018-10-13 NOTE — Patient Instructions (Addendum)
Patient needs a bone density. Return in one year . Tale miralax daily and may try glycerin suppositories . The patient is aware to call back for any questions or concerns.  The patient is scheduled for a bone density test at [facility] on 11/30/18 at 9:40 am.

## 2018-10-24 ENCOUNTER — Other Ambulatory Visit: Payer: Self-pay | Admitting: Nurse Practitioner

## 2018-10-24 DIAGNOSIS — J301 Allergic rhinitis due to pollen: Secondary | ICD-10-CM

## 2018-11-28 DIAGNOSIS — G4733 Obstructive sleep apnea (adult) (pediatric): Secondary | ICD-10-CM | POA: Diagnosis not present

## 2018-11-28 DIAGNOSIS — J301 Allergic rhinitis due to pollen: Secondary | ICD-10-CM | POA: Diagnosis not present

## 2018-11-30 ENCOUNTER — Ambulatory Visit
Admission: RE | Admit: 2018-11-30 | Discharge: 2018-11-30 | Disposition: A | Discharge status: Home / Self Care | Payer: Medicare Other | Source: Ambulatory Visit | Attending: General Surgery | Admitting: General Surgery

## 2018-11-30 DIAGNOSIS — Z13820 Encounter for screening for osteoporosis: Secondary | ICD-10-CM | POA: Diagnosis not present

## 2018-11-30 DIAGNOSIS — Z780 Asymptomatic menopausal state: Secondary | ICD-10-CM | POA: Insufficient documentation

## 2018-12-01 DIAGNOSIS — M1711 Unilateral primary osteoarthritis, right knee: Secondary | ICD-10-CM | POA: Diagnosis not present

## 2019-01-10 ENCOUNTER — Encounter: Payer: Self-pay | Admitting: Neurology

## 2019-01-10 NOTE — Progress Notes (Signed)
Received approval for Tecfidera 240 mg capsules valid 01/09/2019 until further notice.

## 2019-02-13 ENCOUNTER — Ambulatory Visit: Payer: Medicare Other | Admitting: Neurology

## 2019-03-24 ENCOUNTER — Telehealth: Payer: Self-pay

## 2019-03-24 NOTE — Telephone Encounter (Signed)
Rcvd call from Herbie Baltimore with [facility] [phone] x [phone]. He needed to verify Pt is not taking Tecfidera. I advised him I will return call only if she is still taking.  Called Pt, she is not taking Tecfidera. Could not tolerate the SE. (leg cramps, speech problems, gait disturbance) She does not have a home computer and has a flip phone and can not utilize a virtual visit. She has made an appointment for the next available in August. I sent a refill in for her Baclofen.

## 2019-03-27 ENCOUNTER — Other Ambulatory Visit: Payer: Self-pay

## 2019-03-27 MED ORDER — BACLOFEN 10 MG PO TABS: 10.0000 mg | ORAL_TABLET | Freq: Three times a day (TID) | ORAL | 3 refills | Status: DC

## 2019-04-04 ENCOUNTER — Ambulatory Visit: Payer: Medicare Other | Admitting: Podiatry

## 2019-04-07 ENCOUNTER — Other Ambulatory Visit: Payer: Self-pay

## 2019-04-07 ENCOUNTER — Encounter: Payer: Self-pay | Admitting: Podiatry

## 2019-04-07 ENCOUNTER — Ambulatory Visit (INDEPENDENT_AMBULATORY_CARE_PROVIDER_SITE_OTHER): Payer: Medicare Other | Admitting: Podiatry

## 2019-04-07 DIAGNOSIS — E0843 Diabetes mellitus due to underlying condition with diabetic autonomic (poly)neuropathy: Secondary | ICD-10-CM

## 2019-04-07 DIAGNOSIS — L989 Disorder of the skin and subcutaneous tissue, unspecified: Secondary | ICD-10-CM

## 2019-04-07 DIAGNOSIS — B351 Tinea unguium: Secondary | ICD-10-CM

## 2019-04-07 DIAGNOSIS — M79676 Pain in unspecified toe(s): Secondary | ICD-10-CM | POA: Diagnosis not present

## 2019-04-10 NOTE — Progress Notes (Signed)
    Subjective: Patient is a 68 y.o. female presenting to the office today with a chief complaint of painful callus lesions noted to the bilateral feet that have been present for the past few months. Walking and bearing weight increases the pain. She has not done anything at home for treatment.  Patient also complains of elongated, thickened nails that cause pain while ambulating in shoes. She is unable to trim her own nails. Patient presents today for further treatment and evaluation.  Past Medical History:  Diagnosis Date  . Anemia   . B12 deficiency 07/23/2016   Will check levels to determine if injections are needed again. Await results. Recent CBC at Onc WNL  . Cancer ([place]) 12/30/2013   Multifocal disease: pT1c,m, N0(isolated tumor cells) Er/ PR positive, Her 2 negative.  . Cancer ([place]) 01/09/2014   Invasive lobular carcinoma, 2.2 cm, T2,N1 ER/PR positive, HER-2/neu negative  . Diffuse cystic mastopathy   . Essential hypertension, benign 09/24/2017  . Family history of malignant neoplasm of gastrointestinal tract 2012  . Fractured elbow 08/28/14  . Hx of metabolic acidosis with increased anion gap   . Increased heart rate   . Multiple sclerosis ([place])   . Neuromuscular disorder ([place])    neuropathy in bil feet - left is worse.  . Obesity, unspecified   . Peripheral neuropathy   . Personal history of tobacco use, presenting hazards to health   . Restless leg syndrome   . Sleep apnea    uses CPAP  . Special screening for malignant neoplasms, colon     Objective:  Physical Exam General: Alert and oriented x3 in no acute distress  Dermatology: Calluses noted to bilateral feet. Skin is warm, dry and supple bilateral lower extremities. Negative for open lesions or macerations. Nails are tender, long, thickened and dystrophic with subungual debris, consistent with onychomycosis, 1-5 bilateral. No signs of infection noted.  Vascular: Palpable pedal pulses bilaterally. No edema or  erythema noted. Capillary refill within normal limits.  Neurological: Epicritic and protective threshold diminished bilaterally.   Musculoskeletal Exam: Pain on palpation at the keratotic lesion noted. Range of motion within normal limits bilateral. Muscle strength 5/5 in all groups bilateral.  Assessment: 1. Onychodystrophic nails 1-5 bilateral with hyperkeratosis of nails.  2. Onychomycosis of nail due to dermatophyte bilateral 3. Calluses noted to the bilateral feet   Plan of Care:  1. Patient evaluated. 2. Excisional debridement of keratoic lesion using a chisel blade was performed without incident.  3. Dressed with light dressing. 4. Mechanical debridement of nails 1-5 bilaterally performed using a nail nipper. Filed with dremel without incident.  5. Patient is to return to the clinic annually.   Edrick Kins, DPM [facility]  Dr. Edrick Kins, [address]                                        [place], [place] [address]                Office [phone]  Fax [phone]

## 2019-04-13 ENCOUNTER — Telehealth: Payer: Self-pay | Admitting: Neurology

## 2019-04-13 NOTE — Telephone Encounter (Signed)
Patient called regarding her Baclofen medication. She was given a 30 day supply and she is to take 3 Pills a day. She said she has 3 refills but it will not last her until August. She uses [facility] [address]. She said the Pharmacy   Please Call. Thanks

## 2019-04-14 ENCOUNTER — Other Ambulatory Visit: Payer: Medicare Other

## 2019-04-14 ENCOUNTER — Other Ambulatory Visit: Payer: Self-pay

## 2019-04-14 DIAGNOSIS — T451X5A Adverse effect of antineoplastic and immunosuppressive drugs, initial encounter: Secondary | ICD-10-CM | POA: Diagnosis not present

## 2019-04-14 DIAGNOSIS — E1169 Type 2 diabetes mellitus with other specified complication: Secondary | ICD-10-CM | POA: Diagnosis not present

## 2019-04-14 DIAGNOSIS — E785 Hyperlipidemia, unspecified: Secondary | ICD-10-CM | POA: Diagnosis not present

## 2019-04-14 DIAGNOSIS — G620 Drug-induced polyneuropathy: Secondary | ICD-10-CM | POA: Diagnosis not present

## 2019-04-14 DIAGNOSIS — G35 Multiple sclerosis: Secondary | ICD-10-CM | POA: Diagnosis not present

## 2019-04-14 DIAGNOSIS — Z8639 Personal history of other endocrine, nutritional and metabolic disease: Secondary | ICD-10-CM

## 2019-04-14 MED ORDER — BACLOFEN 10 MG PO TABS: 10.0000 mg | ORAL_TABLET | Freq: Three times a day (TID) | ORAL | 3 refills | Status: DC | PRN

## 2019-04-14 NOTE — Telephone Encounter (Signed)
Called Pt and advised Rx sent in

## 2019-04-15 LAB — COMPLETE METABOLIC PANEL WITH GFR
AG Ratio: 1.7 (calc) (ref 1.0–2.5)
ALT: 20 U/L (ref 6–29)
AST: 19 U/L (ref 10–35)
Albumin: 4.3 g/dL (ref 3.6–5.1)
Alkaline phosphatase (APISO): 47 U/L (ref 37–153)
BUN: 16 mg/dL (ref 7–25)
CO2: 28 mmol/L (ref 20–32)
Calcium: 10 mg/dL (ref 8.6–10.4)
Chloride: 103 mmol/L (ref 98–110)
Creat: 0.95 mg/dL (ref 0.50–0.99)
GFR, Est African American: 72 mL/min/{1.73_m2} (ref >=60)
GFR, Est Non African American: 62 mL/min/{1.73_m2} (ref >=60)
Globulin: 2.6 g/dL (calc) (ref 1.9–3.7)
Glucose, Bld: 92 mg/dL (ref 65–99)
Potassium: 4.5 mmol/L (ref 3.5–5.3)
Sodium: 140 mmol/L (ref 135–146)
Total Bilirubin: 0.5 mg/dL (ref 0.2–1.2)
Total Protein: 6.9 g/dL (ref 6.1–8.1)

## 2019-04-15 LAB — CBC WITH DIFFERENTIAL/PLATELET
Absolute Monocytes: 538 cells/uL (ref 200–950)
Basophils Absolute: 41 cells/uL (ref 0–200)
Basophils Relative: 0.6 %
Eosinophils Absolute: 173 cells/uL (ref 15–500)
Eosinophils Relative: 2.5 %
HCT: 35.2 % (ref 35.0–45.0)
Hemoglobin: 12 g/dL (ref 11.7–15.5)
Lymphs Abs: 2187 cells/uL (ref 850–3900)
MCH: 32.2 pg (ref 27.0–33.0)
MCHC: 34.1 g/dL (ref 32.0–36.0)
MCV: 94.4 fL (ref 80.0–100.0)
MPV: 11 fL (ref 7.5–12.5)
Monocytes Relative: 7.8 %
Neutro Abs: 3961 cells/uL (ref 1500–7800)
Neutrophils Relative %: 57.4 %
Platelets: 185 10*3/uL (ref 140–400)
RBC: 3.73 10*6/uL — ABNORMAL LOW (ref 3.80–5.10)
RDW: 12.4 % (ref 11.0–15.0)
Total Lymphocyte: 31.7 %
WBC: 6.9 10*3/uL (ref 3.8–10.8)

## 2019-04-15 LAB — LIPID PANEL
Cholesterol: 174 mg/dL (ref <200)
HDL: 39 mg/dL — ABNORMAL LOW (ref >=50)
LDL Cholesterol (Calc): 98 mg/dL (calc)
Non-HDL Cholesterol (Calc): 135 mg/dL (calc) — ABNORMAL HIGH (ref <130)
Total CHOL/HDL Ratio: 4.5 (calc) (ref <5.0)
Triglycerides: 257 mg/dL — ABNORMAL HIGH (ref <150)

## 2019-04-15 LAB — HEMOGLOBIN A1C
Hgb A1c MFr Bld: 5.6 % of total Hgb (ref <5.7)
Mean Plasma Glucose: 114 (calc)
eAG (mmol/L): 6.3 (calc)

## 2019-04-15 LAB — TSH: TSH: 1.45 mIU/L (ref 0.40–4.50)

## 2019-04-15 LAB — VITAMIN B12: Vitamin B-12: >2000 pg/mL — ABNORMAL HIGH (ref 200–1100)

## 2019-04-21 ENCOUNTER — Ambulatory Visit (INDEPENDENT_AMBULATORY_CARE_PROVIDER_SITE_OTHER): Payer: Medicare Other | Admitting: Family Medicine

## 2019-04-21 ENCOUNTER — Other Ambulatory Visit: Payer: Self-pay

## 2019-04-21 ENCOUNTER — Encounter: Payer: Self-pay | Admitting: Family Medicine

## 2019-04-21 DIAGNOSIS — I10 Essential (primary) hypertension: Secondary | ICD-10-CM | POA: Diagnosis not present

## 2019-04-21 DIAGNOSIS — G35 Multiple sclerosis: Secondary | ICD-10-CM | POA: Diagnosis not present

## 2019-04-21 DIAGNOSIS — E785 Hyperlipidemia, unspecified: Secondary | ICD-10-CM | POA: Diagnosis not present

## 2019-04-21 DIAGNOSIS — M170 Bilateral primary osteoarthritis of knee: Secondary | ICD-10-CM

## 2019-04-21 DIAGNOSIS — E1169 Type 2 diabetes mellitus with other specified complication: Secondary | ICD-10-CM

## 2019-04-21 DIAGNOSIS — M791 Myalgia, unspecified site: Secondary | ICD-10-CM | POA: Diagnosis not present

## 2019-04-21 DIAGNOSIS — Z170 Estrogen receptor positive status [ER+]: Secondary | ICD-10-CM | POA: Diagnosis not present

## 2019-04-21 DIAGNOSIS — T466X5A Adverse effect of antihyperlipidemic and antiarteriosclerotic drugs, initial encounter: Secondary | ICD-10-CM

## 2019-04-21 DIAGNOSIS — G620 Drug-induced polyneuropathy: Secondary | ICD-10-CM | POA: Diagnosis not present

## 2019-04-21 DIAGNOSIS — G720 Drug-induced myopathy: Secondary | ICD-10-CM | POA: Insufficient documentation

## 2019-04-21 DIAGNOSIS — C50212 Malignant neoplasm of upper-inner quadrant of left female breast: Secondary | ICD-10-CM | POA: Diagnosis not present

## 2019-04-21 DIAGNOSIS — R809 Proteinuria, unspecified: Secondary | ICD-10-CM

## 2019-04-21 MED ORDER — MELOXICAM 7.5 MG PO TABS: 7.5000 mg | ORAL_TABLET | Freq: Every day | ORAL | 1 refills | Status: DC | PRN

## 2019-04-21 MED ORDER — LISINOPRIL 5 MG PO TABS: 5.0000 mg | ORAL_TABLET | Freq: Every evening | ORAL | 1 refills | Status: DC

## 2019-04-21 NOTE — Assessment & Plan Note (Signed)
Well-controlled HTN - Home BP readings normal  No known complications  OFF Carvedilol from prior visit 03/2018, not indicated if normal HR and no cardiac etiology.   Plan:  1. Continue current BP regimen - Lisinopril 5mg  daily 2. Encourage improved lifestyle - low sodium diet, regular exercise 3. Continue monitor BP outside office, bring readings to next visit, if persistently >140/90 or new symptoms notify office sooner

## 2019-04-21 NOTE — Assessment & Plan Note (Signed)
Followed by [facility] Dr Tomi Likens Continue current therapy  Due for future Vitamin testing, requesting per Neurology, Vitamin D and Vitamin B12 at next visit.

## 2019-04-21 NOTE — Assessment & Plan Note (Signed)
Controlled cholesterol on lifestyle / now higher TG Last lipid panel 03/2019  Plan: 1. Increased Fish Oil omega 3 1,000mg  to TWICE a day for HyperTG 2. Continue ASA 81mg  for primary ASCVD risk reduction 3. Encourage improved lifestyle - low carb/cholesterol, reduce portion size, continue improving regular exercise

## 2019-04-21 NOTE — Assessment & Plan Note (Signed)
Chronic problem, stable Secondary to chemotherapy Less likely secondary to diabetes as well 

## 2019-04-21 NOTE — Assessment & Plan Note (Addendum)
Well-controlled DM with improved A1c 5.6 now No hyperglycemia. No hypoglycemia. Complications - peripheral neuropathy mostly due to chemotherapy toxicity, other including hyperlipidemia, GERD, , obesity, hypothyroidism, OSA - increases risk of future cardiovascular complications    Plan:  1. DC Metformin 500mg  daily - was on minimal dose, trial off now 2. Encourage improved lifestyle - low carb, low sugar diet, reduce portion size, continue improving regular exercise 3. Check CBG if needed 4. Continue ACEi, - refused restart Statin 5. UTD DM foot and had podiatry exam / she will re-schedule DM Eye since pandemic cancelled apt 6. Follow-up 6 months A1c

## 2019-04-21 NOTE — Assessment & Plan Note (Signed)
Followed by Gen Surgery Dr Bary Castilla On Tamoxifen

## 2019-04-21 NOTE — Progress Notes (Signed)
Virtual Visit via Telephone The purpose of this virtual visit is to provide medical care while limiting exposure to the novel coronavirus (COVID19) for both patient and office staff.  Consent was obtained for phone visit:  Yes.   Answered questions that patient had about telehealth interaction:  Yes.   I discussed the limitations, risks, security and privacy concerns of performing an evaluation and management service by telephone. I also discussed with the patient that there may be a patient responsible charge related to this service. The patient expressed understanding and agreed to proceed.  Patient Location: Home Provider Location: [facility])  ---------------------------------------------------------------------- Chief Complaint  Patient presents with  . Diabetes    S: Reviewed CMA documentation. I have called patient and gathered additional HPI as follows:  Additional concern Left knee pain and pressure - Knee surgery - since 2015 s/p Left total knee replacement, she feels like her knee has an ACE bandage around it, and feels pain within her knee.  History of Vitamin B12 Deficiency / Anemia / Vitamin D In past Neurologist has managed her vitamin deficiency, she was on Vitamin D and Vitamin B12 both of these were elevated she was taken off Vitamin D and her vitamin B12 remained at 5000 daily, now result is elevated B12 again - Neuro check vitamins at next visit  CHRONIC DM, Type 2 / Chemotherapy toxic neuropathy Reports no concerns, doing well with sugar, previously in May 2019 she was reduced on Metformin from 500 BID down to daily Now she is interested in stopping metformin as previously discussed A1c 5.6 improvement CBGs: Avg 100-120, no low Meds: Metformin 581m daily (previously was on BID) Reports good compliance. Tolerating well w/o side-effects Currently on ACEi Lifestyle: - Diet (balanced diet, improved carbs and sugars - some increased  carbs) - Exercise (active, improving) L>R bilateral foot neuropathy secondary to chemotherapy Denies hypoglycemia, polyuria, visual changes, numbness or tingling.  HYPERLIPIDEMIA: History of failed Atorvastatin back in 03/2018 due to leg cramps and muscle cramp Last lab showed still elevated TG now, but normalized LDL off statin Taking Fish Oil omega 3 1,000 daily Admits increased carbs during pandemic  CHRONIC HTN: No new concerns. She checks BP regularly Current Meds - Lisinopril 568mOFF Carvedilol Reports good compliance, took meds today. Tolerating well, w/o complaints. Denies CP, dyspnea, HA, edema, dizziness / lightheadedness  History of Bilateral Breast cancer See chart for background, prior biopsy confirmed bilateral breast cancer, invasive, s/p bilateral mastectomy, on chemotherapy options. [facility], treated with adjuvant chemotherapy in past, completed in 2015. Switched to Tamoxifen for goal of 5 years completed treatment until 2021. No mammograms required d/t mastectomy. [place] q 6 months.  [facility]ail [facility] MRI testing, confirmed in past. Treated with disease modifying treatment.   Denies any high risk travel to areas of current concern for COVID19. Denies any known or suspected exposure to person with or possibly with COVID19.  Denies any fevers, chills, sweats, body ache, cough, shortness of breath, sinus pain or pressure, headache, abdominal pain, diarrhea  Past Medical History:  Diagnosis Date  . Anemia   . B12 deficiency 07/23/2016   Will check levels to determine if injections are needed again. Await results. Recent CBC at Onc WNL  . [place] [place]   Multifocal disease: pT1c,m, N0(isolated tumor cells) Er/ PR positive, Her 2 negative.  . Cancer ([facility]   Invasive lobular carcinoma, 2.2 cm, T2,N1 ER/PR  positive, HER-2/neu negative  . Diffuse cystic mastopathy    . Essential hypertension, benign 09/24/2017  . Family history of malignant neoplasm of gastrointestinal tract 2012  . Fractured elbow 08/28/14  . Hx of metabolic acidosis with increased anion gap   . Increased heart rate   . Multiple sclerosis ([place])   . Neuromuscular disorder ([place])    neuropathy in bil feet - left is worse.  . Obesity, unspecified   . Peripheral neuropathy   . Personal history of tobacco use, presenting hazards to health   . Restless leg syndrome   . Sleep apnea    uses CPAP  . Special screening for malignant neoplasms, colon    Social History   Tobacco Use  . Smoking status: Former Smoker    Packs/day: 1.00    Years: 30.00    Pack years: 30.00    Types: Cigarettes    Last attempt to quit: 12/11/2013    Years since quitting: 5.3  . Smokeless tobacco: Former Network engineer Use Topics  . Alcohol use: No    Alcohol/week: 0.0 standard drinks    Comment: social  . Drug use: No    Current Outpatient Medications:  .  acetaminophen (TYLENOL) 500 MG tablet, Take 1,000 mg by mouth 2 (two) times daily as needed for moderate pain or headache., Disp: , Rfl:  .  baclofen (LIORESAL) 10 MG tablet, Take 1 tablet (10 mg total) by mouth 3 (three) times daily as needed for muscle spasms., Disp: 90 each, Rfl: 3 .  BAYER CONTOUR NEXT TEST test strip, 1 each by Other route daily. , Disp: , Rfl: 3 .  Calcium Carbonate-Vitamin D (CALCIUM 600+D PO), Take 1 tablet by mouth 2 (two) times daily., Disp: , Rfl:  .  famotidine (PEPCID) 20 MG tablet, Take 1 tablet (20 mg total) by mouth 2 (two) times daily before a meal., Disp: 180 tablet, Rfl: 3 .  fluticasone (FLONASE) 50 MCG/ACT nasal spray, PLACE 2 SPRAYS INTO BOTH NOSTRILS AT BEDTIME., Disp: 48 g, Rfl: 1 .  lisinopril (ZESTRIL) 5 MG tablet, Take 1 tablet (5 mg total) by mouth every evening., Disp: 90 tablet, Rfl: 1 .  meloxicam (MOBIC) 7.5 MG tablet, Take 1 tablet (7.5 mg total) by mouth daily as needed for pain., Disp: 90 tablet, Rfl:  1 .  MICROLET LANCETS MISC, , Disp: , Rfl: 3 .  Omega-3 Fatty Acids (FISH OIL) 1000 MG CAPS, Take 1,000 mg by mouth at bedtime., Disp: , Rfl:  .  polyethylene glycol powder (GLYCOLAX/MIRALAX) powder, Take 17 g by mouth 2 (two) times daily as needed for moderate constipation., Disp: 3350 g, Rfl: 1 .  tamoxifen (NOLVADEX) 20 MG tablet, TAKE 1 TABLET (20 MG TOTAL) BY MOUTH DAILY., Disp: 90 tablet, Rfl: 1 .  traZODone (DESYREL) 50 MG tablet, TAKE 2 TABLETS AT BEDTIME (Patient taking differently: Take 100 mg by mouth at bedtime. ), Disp: 180 tablet, Rfl: 1 .  vitamin B-12 (CYANOCOBALAMIN) 1000 MCG tablet, Take 1,000 mcg by mouth daily., Disp: , Rfl:   Depression screen [facility] 2/9 07/22/2018 09/24/2017 09/21/2016  Decreased Interest 0 0 0  Down, Depressed, Hopeless 0 0 0  PHQ - 2 Score 0 0 0  Altered sleeping - 2 -  Tired, decreased energy - 3 -  Change in appetite - 3 -  Feeling bad or failure about yourself  - 0 -  Trouble concentrating - 0 -  Moving slowly or fidgety/restless - 1 -  Suicidal thoughts -  0 -  PHQ-9 Score - 9 -    No flowsheet data found.  -------------------------------------------------------------------------- O: No physical exam performed due to remote telephone encounter.  Lab results reviewed.  Recent Labs    07/22/18 0848 04/14/19 0801  HGBA1C 5.9* 5.6     Recent Results (from the past 2160 hour(s))  TSH     Status: None   Collection Time: 04/14/19  8:01 AM  Result Value Ref Range   TSH 1.45 0.40 - 4.50 mIU/L  Vitamin B12     Status: Abnormal   Collection Time: 04/14/19  8:01 AM  Result Value Ref Range   Vitamin B-12 >2,000 (H) 200 - 1,100 pg/mL  Lipid panel     Status: Abnormal   Collection Time: 04/14/19  8:01 AM  Result Value Ref Range   Cholesterol 174 <200 mg/dL   HDL 39 (L) > OR = 50 mg/dL   Triglycerides 257 (H) <150 mg/dL    Comment: . If a non-fasting specimen was collected, consider repeat triglyceride testing on a fasting specimen if  clinically indicated.  Yates Decamp et al. J. of Clin. Lipidol. 2010;0:712-197. Marland Kitchen    LDL Cholesterol (Calc) 98 mg/dL (calc)    Comment: Reference range: <100 . Desirable range <100 mg/dL for primary prevention;   <70 mg/dL for patients with CHD or diabetic patients  with > or = 2 CHD risk factors. Marland Kitchen LDL-C is now calculated using the Martin-Hopkins  calculation, which is a validated novel method providing  better accuracy than the Friedewald equation in the  estimation of LDL-C.  Cresenciano Genre et al. Annamaria Helling. 5883;254(98): 2061-2068  (http://education.[facility].com/faq/FAQ164)    Total CHOL/HDL Ratio 4.5 <5.0 (calc)   Non-HDL Cholesterol (Calc) 135 (H) <130 mg/dL (calc)    Comment: For patients with diabetes plus 1 major ASCVD risk  factor, treating to a non-HDL-C goal of <100 mg/dL  (LDL-C of <70 mg/dL) is considered a therapeutic  option.   COMPLETE METABOLIC PANEL WITH GFR     Status: None   Collection Time: 04/14/19  8:01 AM  Result Value Ref Range   Glucose, Bld 92 65 - 99 mg/dL    Comment: .            Fasting reference interval .    BUN 16 7 - 25 mg/dL   Creat 0.95 0.50 - 0.99 mg/dL    Comment: For patients >10 years of age, the reference limit for Creatinine is approximately 13% higher for people identified as African-American. .    GFR, Est Non African American 62 > OR = 60 mL/min/1.73m   GFR, Est African American 72 > OR = 60 mL/min/1.776m  BUN/Creatinine Ratio NOT APPLICABLE 6 - 22 (calc)   Sodium 140 135 - 146 mmol/L   Potassium 4.5 3.5 - 5.3 mmol/L   Chloride 103 98 - 110 mmol/L   CO2 28 20 - 32 mmol/L   Calcium 10.0 8.6 - 10.4 mg/dL   Total Protein 6.9 6.1 - 8.1 g/dL   Albumin 4.3 3.6 - 5.1 g/dL   Globulin 2.6 1.9 - 3.7 g/dL (calc)   AG Ratio 1.7 1.0 - 2.5 (calc)   Total Bilirubin 0.5 0.2 - 1.2 mg/dL   Alkaline phosphatase (APISO) 47 37 - 153 U/L   AST 19 10 - 35 U/L   ALT 20 6 - 29 U/L  CBC with Differential/Platelet     Status: Abnormal    Collection Time: 04/14/19  8:01 AM  Result Value Ref Range  WBC 6.9 3.8 - 10.8 Thousand/uL   RBC 3.73 (L) 3.80 - 5.10 Million/uL   Hemoglobin 12.0 11.7 - 15.5 g/dL   HCT 35.2 35.0 - 45.0 %   MCV 94.4 80.0 - 100.0 fL   MCH 32.2 27.0 - 33.0 pg   MCHC 34.1 32.0 - 36.0 g/dL   RDW 12.4 11.0 - 15.0 %   Platelets 185 140 - 400 Thousand/uL   MPV 11.0 7.5 - 12.5 fL   Neutro Abs 3,961 1,500 - 7,800 cells/uL   Lymphs Abs 2,187 850 - 3,900 cells/uL   Absolute Monocytes 538 200 - 950 cells/uL   Eosinophils Absolute 173 15 - 500 cells/uL   Basophils Absolute 41 0 - 200 cells/uL   Neutrophils Relative % 57.4 %   Total Lymphocyte 31.7 %   Monocytes Relative 7.8 %   Eosinophils Relative 2.5 %   Basophils Relative 0.6 %  Hemoglobin A1c     Status: None   Collection Time: 04/14/19  8:01 AM  Result Value Ref Range   Hgb A1c MFr Bld 5.6 <5.7 % of total Hgb    Comment: For the purpose of screening for the presence of diabetes: . <5.7%       Consistent with the absence of diabetes 5.7-6.4%    Consistent with increased risk for diabetes             (prediabetes) > or =6.5%  Consistent with diabetes . This assay result is consistent with a decreased risk of diabetes. . Currently, no consensus exists regarding use of hemoglobin A1c for diagnosis of diabetes in children. . According to American Diabetes Association (ADA) guidelines, hemoglobin A1c <7.0% represents optimal control in non-pregnant diabetic patients. Different metrics may apply to specific patient populations.  Standards of Medical Care in Diabetes(ADA). .    Mean Plasma Glucose 114 (calc)   eAG (mmol/L) 6.3 (calc)    -------------------------------------------------------------------------- A&P:  Problem List Items Addressed This Visit    Essential hypertension, benign    Well-controlled HTN - Home BP readings normal  No known complications  OFF Carvedilol from prior visit 03/2018, not indicated if normal HR and no  cardiac etiology.   Plan:  1. Continue current BP regimen - Lisinopril 37m daily 2. Encourage improved lifestyle - low sodium diet, regular exercise 3. Continue monitor BP outside office, bring readings to next visit, if persistently >140/90 or new symptoms notify office sooner      Relevant Medications   lisinopril (ZESTRIL) 5 MG tablet   Hyperlipidemia associated with type 2 diabetes mellitus (HCC) Myalgia due to statin    Controlled cholesterol on lifestyle / now higher TG Last lipid panel 03/2019 - Statin induced myalgia intolerance  Plan: 1. Increased Fish Oil omega 3 1,0053mto TWICE a day for HyperTG 2. Continue ASA 8115mor primary ASCVD risk reduction 3. Encourage improved lifestyle - low carb/cholesterol, reduce portion size, continue improving regular exercise      Relevant Medications   lisinopril (ZESTRIL) 5 MG tablet   Malignant neoplasm of upper-inner quadrant of left breast in female, estrogen receptor positive ([place]  Followed by Gen Surgery Dr ByrBary Castilla Tamoxifen      Microalbuminuria   Relevant Medications   lisinopril (ZESTRIL) 5 MG tablet   Multiple sclerosis ([place]  [facility] JafTomi Likensntinue current therapy  Due for future Vitamin testing, requesting per Neurology, Vitamin D and Vitamin B12 at next visit.      Osteoarthritis of  knee Still pain in L knee s/p TKR - may warrant future referral 2nd opinion if needed Refill Meloxicam    Relevant Medications   meloxicam (MOBIC) 7.5 MG tablet   Peripheral neuropathy due to chemotherapy (HCC)    Chronic problem, stable Secondary to chemotherapy Less likely secondary to diabetes as well      Type 2 diabetes mellitus with other specified complication ([place]) - Primary    Well-controlled DM with improved A1c 5.6 now No hyperglycemia. No hypoglycemia. Complications - peripheral neuropathy mostly due to chemotherapy toxicity, other including hyperlipidemia, GERD, , obesity,  hypothyroidism, OSA - increases risk of future cardiovascular complications    Plan:  1. DC Metformin 578m daily - was on minimal dose, trial off now 2. Encourage improved lifestyle - low carb, low sugar diet, reduce portion size, continue improving regular exercise 3. Check CBG if needed 4. Continue ACEi, - refused restart Statin 5. UTD DM foot and had podiatry exam / she will re-schedule DM Eye since pandemic cancelled apt 6. Follow-up 6 months A1c      Relevant Medications   lisinopril (ZESTRIL) 5 MG tablet      #Vitamin D Not checked this time Return to Neurology to re-check Vitamin D, can restart if needed. Previously was elevated.  #Vitamin B12 Elevated >2k no significant clinical concern, she can reduce current supplement dose from 5k down to 1k now and follow up with labs from Neurology  Meds ordered this encounter  Medications  . meloxicam (MOBIC) 7.5 MG tablet    Sig: Take 1 tablet (7.5 mg total) by mouth daily as needed for pain.    Dispense:  90 tablet    Refill:  1  . lisinopril (ZESTRIL) 5 MG tablet    Sig: Take 1 tablet (5 mg total) by mouth every evening.    Dispense:  90 tablet    Refill:  1    Follow-up: - Return in 6 months for DM A1c  Patient verbalizes understanding with the above medical recommendations including the limitation of remote medical advice.  Specific follow-up and call-back criteria were given for patient to follow-up or seek medical care more urgently if needed.  - Time spent in direct consultation with patient on phone: 17 minutes  ANobie Putnam DEppingGroup 04/21/2019, 8:03 AM

## 2019-04-21 NOTE — Patient Instructions (Addendum)
Reminders   Stop Metformin now we can recheck sugar a1c in 6 months  Reduce Vitamin B12 from 5k down to 1k daily  Contact Dr Tomi Likens to request Vitamin D and B12 testing  I think knee pain and pressure may be nerve related post operatively - we can offer 2nd opinion consultation if interested in future  Please schedule a Follow-up Appointment to: Return in about 6 months (around 10/22/2019) for DM A1c, MS, HLD.  If you have any other questions or concerns, please feel free to call the office or send a message through [place]. You may also schedule an earlier appointment if necessary.  Additionally, you may be receiving a survey about your experience at our office within a few days to 1 week by e-mail or mail. We value your feedback.  Nobie Putnam, DO [place]

## 2019-05-09 ENCOUNTER — Ambulatory Visit (INDEPENDENT_AMBULATORY_CARE_PROVIDER_SITE_OTHER): Payer: Medicare Other

## 2019-05-09 VITALS — Ht 65.0 in | Wt 192.0 lb

## 2019-05-09 DIAGNOSIS — Z0000 Encounter for general adult medical examination without abnormal findings: Secondary | ICD-10-CM | POA: Diagnosis not present

## 2019-05-09 NOTE — Patient Instructions (Signed)
Cassidy Norris , Thank you for taking time to come for your Medicare Wellness Visit. I appreciate your ongoing commitment to your health goals. Please review the following plan we discussed and let me know if I can assist you in the future.   Screening recommendations/referrals: Colonoscopy: completed 06/11/2015, repeat in 2021 Mammogram: not indicated  Bone Density: completed 11/30/2018 Recommended yearly ophthalmology/optometry visit for glaucoma screening and checkup Recommended yearly dental visit for hygiene and checkup  Vaccinations: Influenza vaccine: declined Pneumococcal vaccine: declined Tdap vaccine: up to date Shingles vaccine: declined    Advanced directives: copy on file   Conditions/risks identified: diabetic- discussed chronic care management team, hx of smoking- Burgess Estelle, RN will call you to set up lung cancer screening   Next appointment: Follow up in one year for your annual wellness exam.    Preventive Care 65 Years and Older, Female Preventive care refers to lifestyle choices and visits with your health care provider that can promote health and wellness. What does preventive care include?  A yearly physical exam. This is also called an annual well check.  Dental exams once or twice a year.  Routine eye exams. Ask your health care provider how often you should have your eyes checked.  Personal lifestyle choices, including:  Daily care of your teeth and gums.  Regular physical activity.  Eating a healthy diet.  Avoiding tobacco and drug use.  Limiting alcohol use.  Practicing safe sex.  Taking low-dose aspirin every day.  Taking vitamin and mineral supplements as recommended by your health care provider. What happens during an annual well check? The services and screenings done by your health care provider during your annual well check will depend on your age, overall health, lifestyle risk factors, and family history of disease. Counseling  Your  health care provider may ask you questions about your:  Alcohol use.  Tobacco use.  Drug use.  Emotional well-being.  Home and relationship well-being.  Sexual activity.  Eating habits.  History of falls.  Memory and ability to understand (cognition).  Work and work Statistician.  Reproductive health. Screening  You may have the following tests or measurements:  Height, weight, and BMI.  Blood pressure.  Lipid and cholesterol levels. These may be checked every 5 years, or more frequently if you are over 85 years old.  Skin check.  Lung cancer screening. You may have this screening every year starting at age 4 if you have a 30-pack-year history of smoking and currently smoke or have quit within the past 15 years.  Fecal occult blood test (FOBT) of the stool. You may have this test every year starting at age 18.  Flexible sigmoidoscopy or colonoscopy. You may have a sigmoidoscopy every 5 years or a colonoscopy every 10 years starting at age 68.  Hepatitis C blood test.  Hepatitis B blood test.  Sexually transmitted disease (STD) testing.  Diabetes screening. This is done by checking your blood sugar (glucose) after you have not eaten for a while (fasting). You may have this done every 1-3 years.  Bone density scan. This is done to screen for osteoporosis. You may have this done starting at age 18.  Mammogram. This may be done every 1-2 years. Talk to your health care provider about how often you should have regular mammograms. Talk with your health care provider about your test results, treatment options, and if necessary, the need for more tests. Vaccines  Your health care provider may recommend certain vaccines, such as:  Influenza vaccine. This is recommended every year.  Tetanus, diphtheria, and acellular pertussis (Tdap, Td) vaccine. You may need a Td booster every 10 years.  Zoster vaccine. You may need this after age 55.  Pneumococcal 13-valent  conjugate (PCV13) vaccine. One dose is recommended after age 73.  Pneumococcal polysaccharide (PPSV23) vaccine. One dose is recommended after age 11. Talk to your health care provider about which screenings and vaccines you need and how often you need them. This information is not intended to replace advice given to you by your health care provider. Make sure you discuss any questions you have with your health care provider. Document Released: 12/06/2015 Document Revised: 07/29/2016 Document Reviewed: 09/10/2015 Elsevier Interactive Patient Education  2017 [place] Prevention in the Home Falls can cause injuries. They can happen to people of all ages. There are many things you can do to make your home safe and to help prevent falls. What can I do on the outside of my home?  Regularly fix the edges of walkways and driveways and fix any cracks.  Remove anything that might make you trip as you walk through a door, such as a raised step or threshold.  Trim any bushes or trees on the path to your home.  Use bright outdoor lighting.  Clear any walking paths of anything that might make someone trip, such as rocks or tools.  Regularly check to see if handrails are loose or broken. Make sure that both sides of any steps have handrails.  Any raised decks and porches should have guardrails on the edges.  Have any leaves, snow, or ice cleared regularly.  Use sand or salt on walking paths during winter.  Clean up any spills in your garage right away. This includes oil or grease spills. What can I do in the bathroom?  Use night lights.  Install grab bars by the toilet and in the tub and shower. Do not use towel bars as grab bars.  Use non-skid mats or decals in the tub or shower.  If you need to sit down in the shower, use a plastic, non-slip stool.  Keep the floor dry. Clean up any water that spills on the floor as soon as it happens.  Remove soap buildup in the tub or  shower regularly.  Attach bath mats securely with double-sided non-slip rug tape.  Do not have throw rugs and other things on the floor that can make you trip. What can I do in the bedroom?  Use night lights.  Make sure that you have a light by your bed that is easy to reach.  Do not use any sheets or blankets that are too big for your bed. They should not hang down onto the floor.  Have a firm chair that has side arms. You can use this for support while you get dressed.  Do not have throw rugs and other things on the floor that can make you trip. What can I do in the kitchen?  Clean up any spills right away.  Avoid walking on wet floors.  Keep items that you use a lot in easy-to-reach places.  If you need to reach something above you, use a strong step stool that has a grab bar.  Keep electrical cords out of the way.  Do not use floor polish or wax that makes floors slippery. If you must use wax, use non-skid floor wax.  Do not have throw rugs and other things on the floor that  can make you trip. What can I do with my stairs?  Do not leave any items on the stairs.  Make sure that there are handrails on both sides of the stairs and use them. Fix handrails that are broken or loose. Make sure that handrails are as long as the stairways.  Check any carpeting to make sure that it is firmly attached to the stairs. Fix any carpet that is loose or worn.  Avoid having throw rugs at the top or bottom of the stairs. If you do have throw rugs, attach them to the floor with carpet tape.  Make sure that you have a light switch at the top of the stairs and the bottom of the stairs. If you do not have them, ask someone to add them for you. What else can I do to help prevent falls?  Wear shoes that:  Do not have high heels.  Have rubber bottoms.  Are comfortable and fit you well.  Are closed at the toe. Do not wear sandals.  If you use a stepladder:  Make sure that it is fully  opened. Do not climb a closed stepladder.  Make sure that both sides of the stepladder are locked into place.  Ask someone to hold it for you, if possible.  Clearly mark and make sure that you can see:  Any grab bars or handrails.  First and last steps.  Where the edge of each step is.  Use tools that help you move around (mobility aids) if they are needed. These include:  Canes.  Walkers.  Scooters.  Crutches.  Turn on the lights when you go into a dark area. Replace any light bulbs as soon as they burn out.  Set up your furniture so you have a clear path. Avoid moving your furniture around.  If any of your floors are uneven, fix them.  If there are any pets around you, be aware of where they are.  Review your medicines with your doctor. Some medicines can make you feel dizzy. This can increase your chance of falling. Ask your doctor what other things that you can do to help prevent falls. This information is not intended to replace advice given to you by your health care provider. Make sure you discuss any questions you have with your health care provider. Document Released: 09/05/2009 Document Revised: 04/16/2016 Document Reviewed: 12/14/2014 Elsevier Interactive Patient Education  2017 [employer].

## 2019-05-09 NOTE — Progress Notes (Signed)
Subjective:   Cassidy Norris is a 68 y.o. female who presents for an Initial Medicare Annual Wellness Visit.  This visit is being conducted via phone call  - after an attmept to do on video chat - due to the COVID-19 pandemic. This patient has given me verbal consent via phone to conduct this visit, patient states they are participating from their home address. Some vital signs may be absent or patient reported.   Patient identification: identified by name, DOB, and current address.    Review of Systems      Cardiac Risk Factors include: advanced age (>63mn, >>57women);diabetes mellitus;dyslipidemia;hypertension;smoking/ tobacco exposure     Objective:    Today's Vitals   05/09/19 0832  Weight: 192 lb (87.1 kg)  Height: _0  (1.651 m)  PainSc: 2    Body mass index is 31.95 kg/m.  Advanced Directives 05/09/2019 07/26/2018 07/12/2018 07/08/2018 01/18/2018 09/24/2017 07/20/2017  Does Patient Have a Medical Advance Directive? _1  No;Yes Yes  Type of AParamedicof [place]ick[place]Living will HG[place]Living will HM[place]uaLiving will HM[place]Living will HBernLiving will H[place]Living will HFishers LandingLiving will  Does patient want to make changes to medical advance directive? - - No - Patient declined No - Patient declined No - Patient declined - No - Patient declined  Copy of HCold Springsin Chart? Yes - validated most recent copy scanned in chart (See row information) - Yes Yes No - copy requested No - copy requested Yes    Current Medications (verified) Outpatient Encounter Medications as of 05/09/2019  Medication Sig  . acetaminophen (TYLENOL) 500 MG tablet Take 1,000 mg by mouth 2 (two) times daily as needed for moderate pain or headache.  . baclofen (LIORESAL) 10 MG tablet Take 1 tablet (10 mg total) by mouth 3  (three) times daily as needed for muscle spasms.  .Marland KitchenBAYER CONTOUR NEXT TEST test strip 1 each by Other route daily.   . Calcium Carbonate-Vitamin D (CALCIUM 600+D PO) Take 1 tablet by mouth 2 (two) times daily.  . famotidine (PEPCID) 20 MG tablet Take 1 tablet (20 mg total) by mouth 2 (two) times daily before a meal.  . fluticasone (FLONASE) 50 MCG/ACT nasal spray PLACE 2 SPRAYS INTO BOTH NOSTRILS AT BEDTIME.  .Marland Kitchenlisinopril (ZESTRIL) 5 MG tablet Take 1 tablet (5 mg total) by mouth every evening.  . meloxicam (MOBIC) 7.5 MG tablet Take 1 tablet (7.5 mg total) by mouth daily as needed for pain.  .Marland KitchenMICROLET LANCETS MISC   . Omega-3 Fatty Acids (FISH OIL) 1000 MG CAPS Take 1,000 mg by mouth at bedtime.  . polyethylene glycol powder (GLYCOLAX/MIRALAX) powder Take 17 g by mouth 2 (two) times daily as needed for moderate constipation.  . tamoxifen (NOLVADEX) 20 MG tablet TAKE 1 TABLET (20 MG TOTAL) BY MOUTH DAILY.  . traZODone (DESYREL) 50 MG tablet TAKE 2 TABLETS AT BEDTIME (Patient taking differently: Take 100 mg by mouth at bedtime. )  . vitamin B-12 (CYANOCOBALAMIN) 1000 MCG tablet Take 1,000 mcg by mouth daily.   No facility-administered encounter medications on file as of 05/09/2019.     Allergies (verified) Patient has no known allergies.   History: Past Medical History:  Diagnosis Date  . Anemia   . Arthritis not really diagnosis but have pain  . B12 deficiency 07/23/2016   Will check levels to determine  if injections are needed again. Await results. Recent CBC at [facility]  . Blood transfusion without reported diagnosis 1983 had a miscarriage/dnc  . Cancer ([place]) 12/30/2013   Multifocal disease: pT1c,m, N0(isolated tumor cells) Er/ PR positive, Her 2 negative.  . Cancer ([place]) 01/09/2014   Invasive lobular carcinoma, 2.2 cm, T2,N1 ER/PR positive, HER-2/neu negative  . Cataract 2014?  Marland Kitchen Diabetes mellitus without complication ([place])   . Diffuse cystic mastopathy   . Essential hypertension,  benign 09/24/2017  . Family history of malignant neoplasm of gastrointestinal tract 2012  . Fractured elbow 08/28/14  . Gastroesophageal reflux disease   . Hx of metabolic acidosis with increased anion gap   . Increased heart rate   . Multiple sclerosis ([place])   . Neuromuscular disorder ([place])    neuropathy in bil feet - left is worse.  . Obesity, unspecified   . Peripheral neuropathy   . Personal history of tobacco use, presenting hazards to health   . Restless leg syndrome   . Sleep apnea    uses CPAP  . Special screening for malignant neoplasms, colon    Past Surgical History:  Procedure Laterality Date  . BREAST BIOPSY Right 1973,2001  . BREAST BIOPSY Right 11-07-13   INVASIVE MAMMARY CARCINOMA , ER/PR positive Her2 negative  . BREAST BIOPSY Left 11-27-13   invasive lobular and DCIS  . BREAST SURGERY Bilateral 01/09/14   mastectomy  . CHOLECYSTECTOMY    . COLONOSCOPY  2010   Dr. Jamal Collin  . COLONOSCOPY WITH PROPOFOL N/A 06/11/2015   Procedure: COLONOSCOPY WITH PROPOFOL;  Surgeon: Christene Lye, MD;  Location: [facility] ENDOSCOPY;  Service: Endoscopy;  Laterality: N/A;  . DILATATION & CURETTAGE/HYSTEROSCOPY WITH MYOSURE N/A 07/12/2018   Procedure: DILATATION & CURETTAGE/HYSTEROSCOPY WITH MYOSURE WITH ENDOMETRIAL POLYPECTOMY;  Surgeon: Will Bonnet, MD;  Location: [facility] ORS;  Service: Gynecology;  Laterality: N/A;  . DILATION AND CURETTAGE OF UTERUS  1983  . JOINT REPLACEMENT  11/23/2015  . POLYPECTOMY  1989  . PORTA CATH REMOVAL    . PORTACATH PLACEMENT  2015  . SPINE SURGERY  09/14/14   Ruptured disk L4- L5  . TOTAL KNEE ARTHROPLASTY Left 11/22/2015   Procedure: TOTAL KNEE ARTHROPLASTY;  Surgeon: Earlie Server, MD;  Location: [facility];  Service: Orthopedics;  Laterality: Left;  . TUBAL LIGATION    . WISDOM TOOTH EXTRACTION  2006   Family History  Problem Relation Age of Onset  . Cancer Mother        lung  . COPD Mother   . Cancer Father        colon, stomach  .  Diabetes Maternal Grandmother   . Hypertension Sister   . Rectal cancer Paternal Uncle   . Rectal cancer Paternal Uncle    Social History   Socioeconomic History  . Marital status: Widowed    Spouse name: Not on file  . Number of children: Not on file  . Years of education: some college  . Highest education level: High school graduate  Occupational History  . Occupation: retired  Scientific laboratory technician  . Financial resource strain: Not hard at all  . Food insecurity    Worry: Never true    Inability: Never true  . Transportation needs    Medical: No    Non-medical: No  Tobacco Use  . Smoking status: Former Smoker    Packs/day: 1.00    Years: 30.00    Pack years: 30.00    Types: Cigarettes  Quit date: 12/11/2013    Years since quitting: 5.4  . Smokeless tobacco: Former Network engineer and Sexual Activity  . Alcohol use: No    Alcohol/week: 0.0 standard drinks    Comment: social  . Drug use: No  . Sexual activity: Never  Lifestyle  . Physical activity    Days per week: 0 days    Minutes per session: 0 min  . Stress: Not on file  Relationships  . Social Herbalist on phone: Not on file    Gets together: Not on file    Attends religious service: Not on file    Active member of club or organization: Not on file    Attends meetings of clubs or organizations: Not on file    Relationship status: Not on file  Other Topics Concern  . Not on file  Social History Narrative  . Not on file    Tobacco Counseling Counseling given: Not Answered   Clinical Intake:  Pre-visit preparation completed: Yes  Pain : 0-10 Pain Score: 2  Pain Type: Chronic pain Pain Location: Foot Pain Orientation: Left, Right Pain Descriptors / Indicators: Numbness, Tingling Pain Onset: More than a month ago Pain Frequency: Constant     Nutritional Status: BMI > 30  Obese Nutritional Risks: None Diabetes: Yes CBG done?: No Did pt. bring in CBG monitor from home?: No  How often  do you need to have someone help you when you read instructions, pamphlets, or other written materials from your doctor or pharmacy?: 1 - Never  Nutrition Risk Assessment:  Has the patient had any N/V/D within the last 2 months?  No  Does the patient have any non-healing wounds?  No  Has the patient had any unintentional weight loss or weight gain?  No   Diabetes:  Is the patient diabetic?  Yes  If diabetic, was a CBG obtained today?  n/a Did the patient bring in their glucometer from home?  n/a How often do you monitor your CBG's? Doesn't check unless needed  Financial Strains and Diabetes Management:  Are you having any financial strains with the device, your supplies or your medication? No .  Does the patient want to be seen by Chronic Care Management for management of their diabetes?  No  Would the patient like to be referred to a Nutritionist or for Diabetic Management?  No   Diabetic Exams:  Diabetic Eye Exam: scheduled 05/19/2019 Dr.Woodard   Diabetic Foot Exam: Completed 06/2018. [facility] 03/2019   Interpreter Needed?: No  Information entered by :: Janissa Bertram,LPN   Activities of Daily Living In your present state of health, do you have any difficulty performing the following activities: 05/09/2019 07/08/2018  Hearing? N N  Vision? Y N  Comment scheduled to see dr.woodard on 6/26 -  Difficulty concentrating or making decisions? Y N  Comment sometimes -  Walking or climbing stairs? Y Y  Comment knee pain -  Dressing or bathing? N N  Doing errands, shopping? N N  Preparing Food and eating ? N -  Using the Toilet? N -  In the past six months, have you accidently leaked urine? N -  Do you have problems with loss of bowel control? N -  Managing your Medications? N -  Managing your Finances? N -  Housekeeping or managing your Housekeeping? N -  Some recent data might be hidden     Immunizations and Health Maintenance Immunization History  Administered  Date(s) Administered  . Influenza-Unspecified 08/12/2016, 08/25/2017  . Tdap 07/22/2015   Health Maintenance Due  Topic Date Due  . OPHTHALMOLOGY EXAM  03/09/2019    Patient Care Team: [name], DO as PCP - General (Family Medicine) Christene Lye, MD (General Surgery) Forest Gleason, MD (Inactive) (Oncology) Pieter Partridge, DO as Consulting Physician (Neurology)  Indicate any recent Medical Services you may have received from other than [facility] providers in the past year (date may be approximate).     Assessment:   This is a routine wellness examination for [place].  Hearing/Vision screen  Hearing Screening   _0  _1  _2  _3  _4  _5  _6  _7  _8   Right ear:           Left ear:           Vision Screening Comments: Goes to Dr.Woodard  Dietary issues and exercise activities discussed: Current Exercise Habits: The patient does not participate in regular exercise at present, Exercise limited by: neurologic condition(s)  Goals   None    Depression Screen PHQ 2/9 Scores 05/09/2019 07/22/2018 09/24/2017 09/21/2016 08/28/2016  PHQ - 2 Score 0 0 0 0 0  PHQ- 9 Score - - 9 - 9    Fall Risk Fall Risk  05/09/2019 10/13/2018 08/12/2018 07/22/2018 01/10/2018  Falls in the past year? 0 0 No No No  Comment - - - - -    FALL RISK PREVENTION PERTAINING TO THE HOME:  Any stairs in or around the home? Yes  If so, are there any without handrails? No   Home free of loose throw rugs in walkways, pet beds, electrical cords, etc? Yes  Adequate lighting in your home to reduce risk of falls? Yes   ASSISTIVE DEVICES UTILIZED TO PREVENT FALLS:  Life alert? No  Use of a cane, walker or w/c? Yes  walker for long distances  Grab bars in the bathroom? No  Shower chair or bench in shower? No  Elevated toilet seat or a handicapped toilet? Yes    TIMED UP AND GO:  Unable to perform    Cognitive Function: MMSE - Mini Mental State Exam 02/19/2015   Orientation to time 5  Orientation to Place 5  Registration 3  Attention/ Calculation 4  Recall 3  Language- name 2 objects 2  Language- repeat 1  Language- follow 3 step command 3  Language- read & follow direction 1  Write a sentence 1  Copy design 1  Total score 29     6CIT Screen 05/09/2019  What Year? 0 points  What month? 0 points  What time? 0 points  Count back from 20 0 points  Months in reverse 0 points  Repeat phrase 0 points  Total Score 0    Screening Tests Health Maintenance  Topic Date Due  . OPHTHALMOLOGY EXAM  03/09/2019  . PNA vac Low Risk Adult (1 of 2 - PCV13) 07/23/2019 (Originally 09/10/2017)  . INFLUENZA VACCINE  06/24/2019  . FOOT EXAM  07/23/2019  . HEMOGLOBIN A1C  10/15/2019  . COLONOSCOPY  06/10/2020  . TETANUS/TDAP  07/21/2025  . DEXA SCAN  Completed  . Hepatitis C Screening  Completed    Qualifies for Shingles Vaccine? No   Due for Shingrix. Education has been provided regarding the importance of this vaccine. Pt has been advised to call insurance company to determine out of pocket expense. Advised may also receive vaccine at local pharmacy or Health Dept. Verbalized acceptance and understanding.  Tdap: up to  date   Flu Vaccine: declined   Pneumococcal Vaccine: Patient declined vaccine. Education has been provided regarding the importance of this vaccine but still declined. Advised may receive this vaccine at local pharmacy or Health Dept. Aware to provide a copy of the vaccination record if obtained from local pharmacy or Health Dept. Verbalized acceptance and understanding.   Cancer Screenings:  Colorectal Screening: Completed 06/11/2015, repeat in 5 years   Mammogram: not indicated, both breast removed  Bone Density: Completed 11/30/2018  Lung Cancer Screening: (Low Dose CT Chest recommended if Age 35-80 years, 30 pack-year currently smoking OR have quit w/in 15years.) does qualify.   Lung Cancer Screening Referral: An Epic message  has been sent to Burgess Estelle, RN (Oncology Nurse Navigator) regarding the possible need for this exam. Raquel Sarna will review the patient's chart to determine if the patient truly qualifies for the exam. If the patient qualifies, Raquel Sarna will order the Low Dose CT of the chest to facilitate the scheduling of this exam.  Additional Screening:  Hepatitis C Screening: does qualify; Completed 09/23/2015   Dental Screening: Recommended annual dental exams for proper oral hygiene  Community Resource Referral:  CRR required this visit?  No       Plan:  I have personally reviewed and addressed the Medicare Annual Wellness questionnaire and have noted the following in the patient's chart:  A. Medical and social history B. Use of alcohol, tobacco or illicit drugs  C. Current medications and supplements D. Functional ability and status E.  Nutritional status F.  Physical activity G. Advance directives H. List of other physicians I.  Hospitalizations, surgeries, and ER visits in previous 12 months J.  [place] such as hearing and vision if needed, cognitive and depression L. Referrals and appointments   In addition, I have reviewed and discussed with patient certain preventive protocols, quality metrics, and best practice recommendations. A written personalized care plan for preventive services as well as general preventive health recommendations were provided to patient.   Signed,    Bevelyn Ngo, LPN   [id number]  Nurse Health Advisor    Nurse Notes: none

## 2019-05-10 ENCOUNTER — Telehealth: Payer: Self-pay | Admitting: *Deleted

## 2019-05-10 NOTE — Telephone Encounter (Signed)
Received referral for low dose lung cancer screening CT scan. Message left at phone number listed in EMR for patient to call me back to facilitate scheduling scan.  

## 2019-05-12 ENCOUNTER — Telehealth: Payer: Self-pay | Admitting: *Deleted

## 2019-05-12 DIAGNOSIS — Z87891 Personal history of nicotine dependence: Secondary | ICD-10-CM

## 2019-05-12 DIAGNOSIS — Z122 Encounter for screening for malignant neoplasm of respiratory organs: Secondary | ICD-10-CM

## 2019-05-12 NOTE — Telephone Encounter (Signed)
Received referral for initial lung cancer screening scan. Contacted patient and obtained smoking history,(former, quit 2015, 30 pack year) as well as answering questions related to screening process. Patient denies signs of lung cancer such as weight loss or hemoptysis. Patient denies comorbidity that would prevent curative treatment if lung cancer were found. Patient is scheduled for shared decision making visit and CT scan on (date TBD due to patient's scheduling preference).

## 2019-05-19 DIAGNOSIS — G35 Multiple sclerosis: Secondary | ICD-10-CM | POA: Diagnosis not present

## 2019-05-19 DIAGNOSIS — E119 Type 2 diabetes mellitus without complications: Secondary | ICD-10-CM | POA: Diagnosis not present

## 2019-05-19 DIAGNOSIS — H1045 Other chronic allergic conjunctivitis: Secondary | ICD-10-CM | POA: Diagnosis not present

## 2019-05-19 DIAGNOSIS — H2513 Age-related nuclear cataract, bilateral: Secondary | ICD-10-CM | POA: Diagnosis not present

## 2019-05-19 LAB — HM DIABETES EYE EXAM

## 2019-05-23 ENCOUNTER — Ambulatory Visit
Admission: RE | Admit: 2019-05-23 | Discharge: 2019-05-23 | Disposition: A | Discharge status: Home / Self Care | Payer: Medicare Other | Source: Ambulatory Visit | Attending: Nurse Practitioner | Admitting: Nurse Practitioner

## 2019-05-23 ENCOUNTER — Other Ambulatory Visit: Payer: Self-pay

## 2019-05-23 ENCOUNTER — Ambulatory Visit: Payer: Medicare Other

## 2019-05-23 ENCOUNTER — Inpatient Hospital Stay: Payer: Medicare Other | Attending: Nurse Practitioner | Admitting: Nurse Practitioner

## 2019-05-23 ENCOUNTER — Encounter: Payer: Self-pay | Admitting: Family Medicine

## 2019-05-23 DIAGNOSIS — Z87891 Personal history of nicotine dependence: Secondary | ICD-10-CM | POA: Diagnosis not present

## 2019-05-23 DIAGNOSIS — Z1379 Encounter for other screening for genetic and chromosomal anomalies: Secondary | ICD-10-CM | POA: Diagnosis not present

## 2019-05-23 DIAGNOSIS — Z122 Encounter for screening for malignant neoplasm of respiratory organs: Secondary | ICD-10-CM | POA: Diagnosis not present

## 2019-05-23 NOTE — Progress Notes (Addendum)
Virtual Visit via Video Enabled Telemedicine Note   I connected with Cassidy Norris on 05/23/19 EST at 11:00 AM by video enabled telemedicine visit and verified that I am speaking with the correct person using two identifiers.   I discussed the limitations, risks, security and privacy concerns of performing an evaluation and management service by telemedicine and the availability of in-person appointments. I also discussed with the patient that there may be a patient responsible charge related to this service. The patient expressed understanding and agreed to proceed.   Other persons participating in the visit and their role in the encounter: Shawn Perkins, RN  Patient's location: clinic  Provider's location: home  Chief Complaint: Low Dose CT Screening  Patient agreed to evaluation by telephone/telemedicine to discuss shared decision making for consideration of low dose CT lung cancer screening.    In accordance with CMS guidelines, patient has met eligibility criteria including age, absence of signs or symptoms of lung cancer.  Social History   Tobacco Use  . Smoking status: Former Smoker    Packs/day: 1.00    Years: 30.00    Pack years: 30.00    Types: Cigarettes    Quit date: 12/11/2013    Years since quitting: 5.4  . Smokeless tobacco: Former User  Substance Use Topics  . Alcohol use: No    Alcohol/week: 0.0 standard drinks    Comment: social     A shared decision-making session was conducted prior to the performance of CT scan. This includes one or more decision aids, includes benefits and harms of screening, follow-up diagnostic testing, over-diagnosis, false positive rate, and total radiation exposure.   Counseling on the importance of adherence to annual lung cancer LDCT screening, impact of co-morbidities, and ability or willingness to undergo diagnosis and treatment is imperative for compliance of the program.   Counseling on the importance of continued smoking  cessation for former smokers; the importance of smoking cessation for current smokers, and information about tobacco cessation interventions have been given to patient including [place] [facility] and 1800 quit [place] programs.   Written order for lung cancer screening with LDCT has been given to the patient and any and all questions have been answered to the best of my abilities.    Yearly follow up will be coordinated by Shawn Perkins, Thoracic Navigator.  I discussed the assessment and treatment plan with the patient. The patient was provided an opportunity to ask questions and all were answered. The patient agreed with the plan and demonstrated an understanding of the instructions.   The patient was advised to call back or seek an in-person evaluation if the symptoms worsen or if the condition fails to improve as anticipated.   I provided 15 minutes of face-to-face video visit time during this encounter, and > 50% was spent counseling as documented under my assessment & plan.   Lauren Allen, DNP, AGNP-C [facility] [phone] (work cell) [phone] (office)  

## 2019-05-24 ENCOUNTER — Telehealth: Payer: Self-pay | Admitting: *Deleted

## 2019-05-24 NOTE — Telephone Encounter (Signed)
Notified patient of LDCT lung cancer screening program results with recommendation for 12 month follow up imaging. Also notified of incidental findings noted below and is encouraged to discuss further with PCP who will receive a copy of this note and/or the CT report. Patient verbalizes understanding.   IMPRESSION: 1. Lung-RADS 1, negative. Continue annual screening with low-dose chest CT without contrast in 12 months. 2. Diffuse hepatic steatosis.  Aortic Atherosclerosis (ICD10-I70.0) and Emphysema (ICD10-J43.9).

## 2019-06-14 ENCOUNTER — Encounter: Payer: Self-pay | Admitting: General Surgery

## 2019-07-04 NOTE — Progress Notes (Signed)
Virtual Visit via Telephone Note The purpose of this virtual visit is to provide medical care while limiting exposure to the novel coronavirus.    Consent was obtained for phone visit:  Yes.   Answered questions that patient had about telehealth interaction:  Yes.   I discussed the limitations, risks, security and privacy concerns of performing an evaluation and management service by telephone. I also discussed with the patient that there may be a patient responsible charge related to this service. The patient expressed understanding and agreed to proceed.  Pt location: Home Physician Location: office Name of referring provider:  Nobie Putnam * I connected with .Cassidy Norris at patients initiation/request on 07/05/2019 at 10:30 AM EDT by telephone and verified that I am speaking with the correct person using two identifiers.  Pt MRN:  [id number] Pt DOB:  [birth year]   History of Present Illness:  Cassidy Norris is a 68 year old right-handed woman with multiple sclerosis, former smoker and history of bilateral breast cancer 2015 s/p bilateral mastectomy and s/p L4-5 laminectomy who follows up for multiple sclerosis.    UPDATE: Current disease modifying therapy:  Tecfidera Other medications:  baclofen 10mg  twice daily  04/14/19 LABS:  CBC with WBC 6.9, HGB 12, HCT 35.2, PLT 185, ALC 2,187 cells/uL; CMP with Na 140, K 4.5, Cl 103, CO2 28, glucose 92, BUN 16, Cr 0.95, t bili 0.5, ALP 47, AST 19, ALT 20; B12 >2,000; TSH 1.45  Vision:  No change Motor:  No issues Sensory:  Neuropathy in feet, stable Pain:  Still having muscle cramps and neuropathic pain in legs at night.  Often wakes up in middle of the night with leg pain/cramps.  Gait:  okay Bowel/Bladder:  no Fatigue:  No.  Still having trouble sleeping despite trazodone.   Cognition:  Stable Mood:  Okay  HISTORY: In November 2001, she tripped and fell, hitting her head.She went to the ED where a CT of the head  was reportedly unremarkable.She was advised to have an MRI of the brain, which was performed on 12/12/00.It revealed small round and elliptical white matter lesions less than 1 cm in the periventricular region.In December of 2002, she followed up with a neurologist, Dr. Corinna Capra.VEP performed in 2003 showed prolonged p100 latency in the right optic nerve.BAER was normal.She reportedly had a lumbar puncture where CSF results were supportive of MS.She was given 5 days of Solu-Medrol followed by 3 weeks of oral prednisone.She was advised to start an interferon, but due to possible side effects, she decided to hold off and see if she clinically progresses.She did have a follow up MRI of the brain in December 2004, which reportedly showed an enhancing lesion as well as a remote lesion in the cerebellum in addition to the periventricular white matter lesions.She never had any noticeable clinical flare-ups and had done well off of a disease-modifying agent.Over the past few years, she notes possible worsening in depth perception.Sometimes she notes difficulty grabbing for objects or will drive too close to the curb.  She had an MRI of the brain and cervical spine with and without contrast on 03/12/15, which showed non-enhancing bilateral periventricular white matter lesions oriented perpendicular to the ventricles and corpus callosum, as well as involving temporal lobe white matter and mild involvement of the left side of cerebellum.No cervical cord lesions were seen. Repeat MRI of brain with and without contrast from 01/28/17 was personally reviewed and demonstrated two small new or increased right hemisphere subcortical  white matter lesions when compared to 2016 with no enhancement or active demyelination. Otherwise, MRI is stable.  Repeat MRI of brain with and without contrast from 01/25/18 was personally reviewed and showed stable chronic white matter hyperintensities consistent with  MS.  In 2015, she was diagnosed with bilateral breast cancer.She underwent bilateral mastectomy and chemotherapy, which ended in September.As a result of the chemotherapy, she has neuropathy in the feet.She also had problems with her left leg and was found to have a ruptured disc at L4-5 and underwent surgery in October 2015.She has baseline numbness over the dorsum of left foot and lateral leg. The toes in her left foot sometimes curl. Since chemotherapy, she notes some mild memory problems.She sometimes misplaces objects or will forget if she locked the front door when she left.She will sometimes have difficulty getting a word out.     Observations/Objective:   Height 5\' 5"  (1.651 m), weight 192 lb (87.1 kg), last menstrual period 12/22/1999. No acute distress.  Alert and oriented.  Speech fluent and not dysarthric.  Language intact.   Assessment and Plan:   Multiple sclerosis  1.  Tecfidera 2.  Baclofen 10mg  twice daily for muscle cramps.  Normally, she wakes up at night with muscle cramps if she has been sitting for prolonged period.  On days that she does this, she should go ahead and take 2 baclofen at night. 3.  For neuropathic pain, start gabapentin 100mg  and ttirate to 300mg  at bedtime 4.  Check vitamin D level now 5.  Check CBC with diff and CMP in 3 months 6.  Repeat MRI of brain with and without contrast in 6 months 7.  Follow up shortly after repeat MRI.  Follow Up Instructions:    -I discussed the assessment and treatment plan with the patient. The patient was provided an opportunity to ask questions and all were answered. The patient agreed with the plan and demonstrated an understanding of the instructions.   The patient was advised to call back or seek an in-person evaluation if the symptoms worsen or if the condition fails to improve as anticipated.    Total Time spent in visit with the patient was:  16 minutes  Dudley Major, DO

## 2019-07-05 ENCOUNTER — Other Ambulatory Visit: Payer: Self-pay

## 2019-07-05 ENCOUNTER — Telehealth (INDEPENDENT_AMBULATORY_CARE_PROVIDER_SITE_OTHER): Payer: Medicare Other | Admitting: Neurology

## 2019-07-05 ENCOUNTER — Telehealth: Payer: Self-pay | Admitting: Neurology

## 2019-07-05 ENCOUNTER — Encounter: Payer: Self-pay | Admitting: Neurology

## 2019-07-05 VITALS — Ht 65.0 in | Wt 192.0 lb

## 2019-07-05 DIAGNOSIS — E559 Vitamin D deficiency, unspecified: Secondary | ICD-10-CM

## 2019-07-05 DIAGNOSIS — G620 Drug-induced polyneuropathy: Secondary | ICD-10-CM

## 2019-07-05 DIAGNOSIS — R252 Cramp and spasm: Secondary | ICD-10-CM

## 2019-07-05 DIAGNOSIS — Z79899 Other long term (current) drug therapy: Secondary | ICD-10-CM

## 2019-07-05 DIAGNOSIS — G35 Multiple sclerosis: Secondary | ICD-10-CM

## 2019-07-05 MED ORDER — BACLOFEN 10 MG PO TABS: 10.0000 mg | ORAL_TABLET | Freq: Three times a day (TID) | ORAL | 5 refills | Status: DC | PRN

## 2019-07-05 MED ORDER — GABAPENTIN 100 MG PO CAPS: ORAL_CAPSULE | ORAL | 0 refills | Status: DC

## 2019-07-05 NOTE — Telephone Encounter (Signed)
Patient is calling in about the lab work and wanted to know if she can get her labs done at [facility] at [place]. Thanks!

## 2019-07-07 NOTE — Telephone Encounter (Signed)
Called and spoke with Pt. I advised her I will fax lab request to [facility] on Monday

## 2019-07-10 NOTE — Addendum Note (Signed)
Addended by: Clois Comber on: 07/10/2019 03:32 PM   Modules accepted: Orders

## 2019-07-14 ENCOUNTER — Other Ambulatory Visit: Payer: Self-pay | Admitting: Neurology

## 2019-07-14 DIAGNOSIS — Z79899 Other long term (current) drug therapy: Secondary | ICD-10-CM | POA: Diagnosis not present

## 2019-07-14 DIAGNOSIS — G35 Multiple sclerosis: Secondary | ICD-10-CM | POA: Diagnosis not present

## 2019-07-14 DIAGNOSIS — E559 Vitamin D deficiency, unspecified: Secondary | ICD-10-CM | POA: Diagnosis not present

## 2019-07-15 LAB — VITAMIN D 25 HYDROXY (VIT D DEFICIENCY, FRACTURES): Vit D, 25-Hydroxy: 42.9 ng/mL (ref 30.0–100.0)

## 2019-07-15 LAB — COMPREHENSIVE METABOLIC PANEL
ALT: 34 IU/L — ABNORMAL HIGH (ref 0–32)
AST: 35 IU/L (ref 0–40)
Albumin/Globulin Ratio: 1.9 (ref 1.2–2.2)
Albumin: 4.5 g/dL (ref 3.8–4.8)
Alkaline Phosphatase: 57 IU/L (ref 39–117)
BUN/Creatinine Ratio: 10 — ABNORMAL LOW (ref 12–28)
BUN: 12 mg/dL (ref 8–27)
Bilirubin Total: 0.4 mg/dL (ref 0.0–1.2)
CO2: 23 mmol/L (ref 20–29)
Calcium: 10.4 mg/dL — ABNORMAL HIGH (ref 8.7–10.3)
Chloride: 101 mmol/L (ref 96–106)
Creatinine, Ser: 1.16 mg/dL — ABNORMAL HIGH (ref 0.57–1.00)
GFR calc Af Amer: 57 mL/min/{1.73_m2} — ABNORMAL LOW (ref >59)
GFR calc non Af Amer: 49 mL/min/{1.73_m2} — ABNORMAL LOW (ref >59)
Globulin, Total: 2.4 g/dL (ref 1.5–4.5)
Glucose: 118 mg/dL — ABNORMAL HIGH (ref 65–99)
Potassium: 4.1 mmol/L (ref 3.5–5.2)
Sodium: 142 mmol/L (ref 134–144)
Total Protein: 6.9 g/dL (ref 6.0–8.5)

## 2019-07-18 ENCOUNTER — Telehealth: Payer: Self-pay

## 2019-07-18 ENCOUNTER — Other Ambulatory Visit: Payer: Self-pay

## 2019-07-18 DIAGNOSIS — E559 Vitamin D deficiency, unspecified: Secondary | ICD-10-CM

## 2019-07-18 NOTE — Telephone Encounter (Signed)
Pt informed of Vitamin D level being low.  She states that she has 2000 IU at home. Is it alright that she takes this instead of the 500 IU?  Pt was also informed of decreased kidney function and that she will need to f/u with her PCP for it. She understood and states that she has a f/u in Nov. With her PCP.

## 2019-07-18 NOTE — Telephone Encounter (Signed)
-----   Message from Pieter Partridge, DO sent at 07/18/2019 12:38 PM EDT ----- Vitamin D level slightly lower than I would like.  I would like her to take 500 IU of D3 daily and repeat level about a week prior to next visit.  Her kidney function appears reduced.  I would like her to follow up with her PCP regarding this.

## 2019-07-19 NOTE — Telephone Encounter (Signed)
If she can take 500 or 1000 IU daily, that would be preferable.  She previously had elevated vitamin D and I don't want it to be too high again.

## 2019-07-20 NOTE — Telephone Encounter (Signed)
Informed patient of Dr. Georgie Chard request for her to take 500 IU of D3 daily.  Lab entered for Vitamin D bloodwork to be drawn a week prior to her 01/04/20 appt with Dr. Tomi Likens.  Patient aware to come here to office to check in for lab the week prior  Patient verbalized understanding.

## 2019-07-22 NOTE — Progress Notes (Signed)
[place]  [phone]  Fax:([phone]) [place] DOB: [birth year]  MR#: [id number]  VQQ#:[id number]  Patient Care Team: [name], DO as PCP - General (Family Medicine) Christene Lye, MD (General Surgery) Forest Gleason, MD (Inactive) (Oncology) Pieter Partridge, DO as Consulting Physician (Neurology)  CHIEF COMPLAINT: Bilateral adenocarcinoma of the breast.  INTERVAL HISTORY: Patient returns to clinic today for routine yearly evaluation.  She recently discontinued tamoxifen.  She is no longer under treatment for multiple sclerosis.  She has no new neurologic complaints.  She denies any recent fevers or illnesses. She has a good appetite and denies weight loss.  She has no chest pain, shortness of breath, cough, or hemoptysis.  She denies any nausea, vomiting, constipation, or diarrhea. She has no urinary complaints.  Patient feels at her baseline and offers no specific complaints today.  REVIEW OF SYSTEMS:   Review of Systems  Constitutional: Negative.  Negative for fever, malaise/fatigue and weight loss.  Respiratory: Negative.  Negative for cough and shortness of breath.   Cardiovascular: Negative.  Negative for chest pain and leg swelling.  Gastrointestinal: Negative.  Negative for abdominal pain.  Genitourinary: Negative.  Negative for dysuria.  Musculoskeletal: Negative for myalgias.  Skin: Negative.  Negative for rash.  Neurological: Negative.  Negative for dizziness, sensory change, focal weakness and weakness.  Psychiatric/Behavioral: Negative.  The patient is not nervous/anxious.     As per HPI. Otherwise, a complete review of systems is negative.  ONCOLOGY HISTORY: Oncology History Overview Note  1. Bilateral carcinoma of breast, February, 2015. Right breast status post radical mastectomy. T1 C. N0M0 (isolated tumor cells in one lymph node) multifocal invasive cancer. Stage IC, Left breast, Status post  radical mastectomy, T2, N1, M0 tumor. Stage II, RIght breast. Both tumors are estrogen receptor positive.  Progesterone receptor positive.  HER-2 receptor negative by FISH. Negative for BRCA mutation.  2. Started on Adriamycin and Cytoxan on March 01, 2014. Last cycle of Cytoxan and Adriamycin on May 01, 2014. Patient has finished last chemotherapy on September 9. (12 th dose was omitted because of neuropathy) 3.  Taking tamoxifen.  Patient had a poor tolerance to letrozole.(October, 2015) 4.  Patient is taking letrozole since November of 2015 5.  May, 2016 Letrozole has been put on hold because of bony pains 6.  Started on Aromasin from July of 2016    History of bilateral breast cancer (Resolved)  11/29/2013 Initial Diagnosis   Breast cancer bilateral, multifocal ER/PR pos, Her 2 neg,.     PAST MEDICAL HISTORY: Past Medical History:  Diagnosis Date  . Anemia   . Arthritis not really diagnosis but have pain  . B12 deficiency 07/23/2016   Will check levels to determine if injections are needed again. Await results. Recent CBC at Onc WNL  . Blood transfusion without reported diagnosis 1983 had a miscarriage/dnc  . Cancer ([place]) 12/30/2013   Multifocal disease: pT1c,m, N0(isolated tumor cells) Er/ PR positive, Her 2 negative.  . Cancer ([place]) 01/09/2014   Invasive lobular carcinoma, 2.2 cm, T2,N1 ER/PR positive, HER-2/neu negative  . Cataract 2014?  Marland Kitchen Diabetes mellitus without complication ([place])   . Diffuse cystic mastopathy   . Essential hypertension, benign 09/24/2017  . Family history of malignant neoplasm of gastrointestinal tract 2012  . Fractured elbow 08/28/14  . Gastroesophageal reflux disease   . Hx of metabolic acidosis with increased anion gap   . Increased heart rate   .  Multiple sclerosis ([place])   . Neuromuscular disorder ([place])    neuropathy in bil feet - left is worse.  . Obesity, unspecified   . Peripheral neuropathy   . Personal history of tobacco use, presenting  hazards to health   . Restless leg syndrome   . Sleep apnea    uses CPAP  . Special screening for malignant neoplasms, colon     PAST SURGICAL HISTORY: Past Surgical History:  Procedure Laterality Date  . BREAST BIOPSY Right 1973,2001  . BREAST BIOPSY Right 11-07-13   INVASIVE MAMMARY CARCINOMA , ER/PR positive Her2 negative  . BREAST BIOPSY Left 11-27-13   invasive lobular and DCIS  . BREAST SURGERY Bilateral 01/09/14   mastectomy  . CHOLECYSTECTOMY    . COLONOSCOPY  2010   Dr. Jamal Collin  . COLONOSCOPY WITH PROPOFOL N/A 06/11/2015   Procedure: COLONOSCOPY WITH PROPOFOL;  Surgeon: Christene Lye, MD;  Location: [facility] ENDOSCOPY;  Service: Endoscopy;  Laterality: N/A;  . DILATATION & CURETTAGE/HYSTEROSCOPY WITH MYOSURE N/A 07/12/2018   Procedure: DILATATION & CURETTAGE/HYSTEROSCOPY WITH MYOSURE WITH ENDOMETRIAL POLYPECTOMY;  Surgeon: Will Bonnet, MD;  Location: [facility] ORS;  Service: Gynecology;  Laterality: N/A;  . DILATION AND CURETTAGE OF UTERUS  1983  . JOINT REPLACEMENT  11/23/2015  . POLYPECTOMY  1989  . PORTA CATH REMOVAL    . PORTACATH PLACEMENT  2015  . SPINE SURGERY  09/14/14   Ruptured disk L4- L5  . TOTAL KNEE ARTHROPLASTY Left 11/22/2015   Procedure: TOTAL KNEE ARTHROPLASTY;  Surgeon: Earlie Server, MD;  Location: [facility];  Service: Orthopedics;  Laterality: Left;  . TUBAL LIGATION    . WISDOM TOOTH EXTRACTION  2006    FAMILY HISTORY Family History  Problem Relation Age of Onset  . Cancer Mother        lung  . COPD Mother   . Cancer Father        colon, stomach  . Diabetes Maternal Grandmother   . Hypertension Sister   . Rectal cancer Paternal Uncle   . Rectal cancer Paternal Uncle     GYNECOLOGIC HISTORY:  Patient's last menstrual period was 12/22/1999 (approximate).     ADVANCED DIRECTIVES:    HEALTH MAINTENANCE: Social History   Tobacco Use  . Smoking status: Former Smoker    Packs/day: 1.00    Years: 30.00    Pack years: 30.00    Types:  Cigarettes    Quit date: 12/11/2013    Years since quitting: 5.6  . Smokeless tobacco: Former Network engineer Use Topics  . Alcohol use: No    Alcohol/week: 0.0 standard drinks    Comment: social  . Drug use: No     Colonoscopy:  PAP:  Bone density:  Mammogram:  No Known Allergies  Current Outpatient Medications  Medication Sig Dispense Refill  . acetaminophen (TYLENOL) 500 MG tablet Take 1,000 mg by mouth 2 (two) times daily as needed for moderate pain or headache.    . baclofen (LIORESAL) 10 MG tablet Take 1 tablet (10 mg total) by mouth 3 (three) times daily as needed for muscle spasms. 90 each 5  . Calcium Carbonate-Vitamin D (CALCIUM 600+D PO) Take 1 tablet by mouth 2 (two) times daily.    . famotidine (PEPCID) 20 MG tablet Take 1 tablet (20 mg total) by mouth 2 (two) times daily before a meal. 180 tablet 3  . fluticasone (FLONASE) 50 MCG/ACT nasal spray PLACE 2 SPRAYS INTO BOTH NOSTRILS AT BEDTIME. 48 g 1  .  gabapentin (NEURONTIN) 100 MG capsule Take 1 capsule at bedtime for 7 days, then 2 capsules at bedtime for 7 days, then 3 capsules at bedtime 90 capsule 0  . lisinopril (ZESTRIL) 5 MG tablet Take 1 tablet (5 mg total) by mouth every evening. 90 tablet 1  . meloxicam (MOBIC) 7.5 MG tablet Take 1 tablet (7.5 mg total) by mouth daily as needed for pain. 90 tablet 1  . Omega-3 Fatty Acids (FISH OIL) 1000 MG CAPS Take 1,000 mg by mouth at bedtime.    . polyethylene glycol powder (GLYCOLAX/MIRALAX) powder Take 17 g by mouth 2 (two) times daily as needed for moderate constipation. 3350 g 1  . Pyridoxine HCl (VITAMIN B-6) 500 MG tablet Take 500 mg by mouth daily.    . tamoxifen (NOLVADEX) 20 MG tablet TAKE 1 TABLET (20 MG TOTAL) BY MOUTH DAILY. 90 tablet 1  . traZODone (DESYREL) 50 MG tablet TAKE 2 TABLETS AT BEDTIME (Patient taking differently: Take 100 mg by mouth at bedtime. ) 180 tablet 1  . vitamin B-12 (CYANOCOBALAMIN) 1000 MCG tablet Take 1,000 mcg by mouth daily.     No  current facility-administered medications for this visit.     OBJECTIVE: BP 133/80   Pulse 96   Temp 98.3 F (36.8 C)   Resp 18   Wt 194 lb 3.2 oz (88.1 kg)   LMP 12/22/1999 (Approximate)   BMI 32.32 kg/m    Body mass index is 32.32 kg/m.    ECOG FS:0 - Asymptomatic  General: Well-developed, well-nourished, no acute distress. Eyes: Pink conjunctiva, anicteric sclera. HEENT: Normocephalic, moist mucous membranes. Breasts: Patient requested exam be deferred today. Lungs: Clear to auscultation bilaterally. Heart: Regular rate and rhythm. No rubs, murmurs, or gallops. Abdomen: Soft, nontender, nondistended. No organomegaly noted, normoactive bowel sounds. Musculoskeletal: No edema, cyanosis, or clubbing. Neuro: Alert, answering all questions appropriately. Cranial nerves grossly intact. Skin: No rashes or petechiae noted. Psych: Normal affect.  LAB RESULTS:  No visits with results within 3 Day(s) from this visit.  Latest known visit with results is:  Orders Only on 07/14/2019  Component Date Value Ref Range Status  . Glucose 07/14/2019 118* 65 - 99 mg/dL Final  . BUN 07/14/2019 12  8 - 27 mg/dL Final  . Creatinine, Ser 07/14/2019 1.16* 0.57 - 1.00 mg/dL Final  . GFR calc non Af Amer 07/14/2019 49* >59 mL/min/1.73 Final  . GFR calc Af Amer 07/14/2019 57* >59 mL/min/1.73 Final  . BUN/Creatinine Ratio 07/14/2019 10* 12 - 28 Final  . Sodium 07/14/2019 142  134 - 144 mmol/L Final  . Potassium 07/14/2019 4.1  3.5 - 5.2 mmol/L Final  . Chloride 07/14/2019 101  96 - 106 mmol/L Final  . CO2 07/14/2019 23  20 - 29 mmol/L Final  . Calcium 07/14/2019 10.4* 8.7 - 10.3 mg/dL Final  . Total Protein 07/14/2019 6.9  6.0 - 8.5 g/dL Final  . Albumin 07/14/2019 4.5  3.8 - 4.8 g/dL Final  . Globulin, Total 07/14/2019 2.4  1.5 - 4.5 g/dL Final  . Albumin/Globulin Ratio 07/14/2019 1.9  1.2 - 2.2 Final  . Bilirubin Total 07/14/2019 0.4  0.0 - 1.2 mg/dL Final  . Alkaline Phosphatase 07/14/2019 57   39 - 117 IU/L Final  . AST 07/14/2019 35  0 - 40 IU/L Final  . ALT 07/14/2019 34* 0 - 32 IU/L Final  . Vit D, 25-Hydroxy 07/14/2019 42.9  30.0 - 100.0 ng/mL Final   Comment: Vitamin D deficiency has been defined  by the [place] practice guideline as a level of serum 25-OH vitamin D less than 20 ng/mL (1,2). The Endocrine Society went on to further define vitamin D insufficiency as a level between 21 and 29 ng/mL (2). 1. IOM (Institute of Medicine). 2010. Dietary reference    intakes for calcium and D. [place]: The    [employer]. 2. Holick MF, Binkley [place], Bischoff-Ferrari HA, et al.    Evaluation, treatment, and prevention of vitamin D    deficiency: an Endocrine Society clinical practice    guideline. JCEM. 2011 Jul; 96(7):1911-30.     STUDIES: No results found.  ASSESSMENT: Bilateral adenocarcinoma of the breast (right breast, stage I, T1c N0 M0 (isolated tumor cells in 1 lymph node; left breast, stage II, T2 N1 M0.)  PLAN:    1. Bilateral adenocarcinoma of the breast: Patient is status post bilateral mastectomy in February 2015. She also received adjuvant chemotherapy with Adriamycin, Cytoxan, and Taxol completing treatment in approximately October 2015. She then initiated letrozole, but could not tolerate secondary to leg pain and was switched to Aromasin.  Patient then switched to tamoxifen in October 2019 secondary to cost.  Patient was scheduled to complete 5 years of treatment in October 2020, but has now discontinued tamoxifen. Patient does not require mammograms given her bilateral mastectomy.  Patient continues to request less frequent follow-up, therefore will return to clinic in 1 year for further evaluation at which point patient can possibly be discharged from clinic. 2. Postmenopausal: Bone mineral density on August 01, 2016 reveald a T-score of -1.2. Patient has been instructed to continue her calcium and vitamin  D supplementation. Continue monitoring bone mineral density by PCP. 3.  Multiple sclerosis: Chronic.  Continue evaluation and treatment per neurology.  I spent a total of 20 minutes face-to-face with the patient of which greater than 50% of the visit was spent in counseling and coordination of care as detailed above.    Patient expressed understanding and was in agreement with this plan. She also understands that She can call clinic at any time with any questions, concerns, or complaints.   Breast cancer bilateral, multifocal ER/PR pos, Her 2 neg,.   Staging form: Breast, AJCC 7th Edition     Clinical: Stage IIB (T2, N1, M0) - Signed by Forest Gleason, MD on 04/22/2015   Lloyd Huger, MD   07/28/2019 6:46 AM

## 2019-07-26 ENCOUNTER — Encounter: Payer: Self-pay | Admitting: Oncology

## 2019-07-26 ENCOUNTER — Other Ambulatory Visit: Payer: Self-pay

## 2019-07-26 NOTE — Progress Notes (Signed)
Patient stated that she had been doing well with no complaints. Patient's last Bone density was on 11/30/2018.

## 2019-07-27 ENCOUNTER — Other Ambulatory Visit: Payer: Self-pay

## 2019-07-27 ENCOUNTER — Inpatient Hospital Stay: Payer: Medicare Other | Attending: Oncology | Admitting: Oncology

## 2019-07-27 VITALS — BP 133/80 | HR 96 | Temp 98.3°F | Resp 18 | Wt 194.2 lb

## 2019-07-27 DIAGNOSIS — Z9221 Personal history of antineoplastic chemotherapy: Secondary | ICD-10-CM | POA: Diagnosis not present

## 2019-07-27 DIAGNOSIS — Z853 Personal history of malignant neoplasm of breast: Secondary | ICD-10-CM | POA: Diagnosis not present

## 2019-07-27 DIAGNOSIS — Z170 Estrogen receptor positive status [ER+]: Secondary | ICD-10-CM | POA: Diagnosis not present

## 2019-07-27 DIAGNOSIS — G35 Multiple sclerosis: Secondary | ICD-10-CM | POA: Diagnosis not present

## 2019-07-27 DIAGNOSIS — Z9013 Acquired absence of bilateral breasts and nipples: Secondary | ICD-10-CM | POA: Diagnosis not present

## 2019-07-27 DIAGNOSIS — Z79899 Other long term (current) drug therapy: Secondary | ICD-10-CM | POA: Insufficient documentation

## 2019-08-02 ENCOUNTER — Other Ambulatory Visit: Payer: Self-pay | Admitting: Family Medicine

## 2019-08-02 DIAGNOSIS — E1169 Type 2 diabetes mellitus with other specified complication: Secondary | ICD-10-CM

## 2019-08-02 DIAGNOSIS — R7989 Other specified abnormal findings of blood chemistry: Secondary | ICD-10-CM

## 2019-08-02 DIAGNOSIS — I10 Essential (primary) hypertension: Secondary | ICD-10-CM

## 2019-08-02 NOTE — Addendum Note (Signed)
Addended by: [name] on: 08/02/2019 04:58 PM   Modules accepted: Orders

## 2019-08-10 ENCOUNTER — Other Ambulatory Visit: Payer: Self-pay

## 2019-08-10 ENCOUNTER — Ambulatory Visit
Admission: RE | Admit: 2019-08-10 | Discharge: 2019-08-10 | Disposition: A | Discharge status: Home / Self Care | Payer: Medicare Other | Source: Ambulatory Visit | Attending: Neurology | Admitting: Neurology

## 2019-08-10 DIAGNOSIS — G35 Multiple sclerosis: Secondary | ICD-10-CM

## 2019-08-10 MED ORDER — GADOBENATE DIMEGLUMINE 529 MG/ML IV SOLN
18.0000 mL | Freq: Once | INTRAVENOUS | Status: AC | PRN
  Administered 2019-08-10: 18 mL via INTRAVENOUS

## 2019-10-11 ENCOUNTER — Ambulatory Visit: Payer: Medicare Other | Admitting: Surgery

## 2019-10-16 ENCOUNTER — Other Ambulatory Visit: Payer: Self-pay

## 2019-10-16 ENCOUNTER — Other Ambulatory Visit: Payer: Medicare Other

## 2019-10-16 ENCOUNTER — Ambulatory Visit: Payer: Medicare Other | Admitting: Surgery

## 2019-10-16 DIAGNOSIS — I10 Essential (primary) hypertension: Secondary | ICD-10-CM

## 2019-10-16 DIAGNOSIS — E1169 Type 2 diabetes mellitus with other specified complication: Secondary | ICD-10-CM | POA: Diagnosis not present

## 2019-10-16 DIAGNOSIS — R7989 Other specified abnormal findings of blood chemistry: Secondary | ICD-10-CM | POA: Diagnosis not present

## 2019-10-17 ENCOUNTER — Encounter: Payer: Self-pay | Admitting: Surgery

## 2019-10-17 ENCOUNTER — Ambulatory Visit (INDEPENDENT_AMBULATORY_CARE_PROVIDER_SITE_OTHER): Payer: Medicare Other | Admitting: Surgery

## 2019-10-17 VITALS — BP 147/82 | HR 104 | Temp 97.5°F | Resp 18 | Ht 64.0 in | Wt 202.0 lb

## 2019-10-17 DIAGNOSIS — Z853 Personal history of malignant neoplasm of breast: Secondary | ICD-10-CM

## 2019-10-17 LAB — BASIC METABOLIC PANEL WITH GFR
BUN/Creatinine Ratio: 13 (calc) (ref 6–22)
BUN: 13 mg/dL (ref 7–25)
CO2: 24 mmol/L (ref 20–32)
Calcium: 9.4 mg/dL (ref 8.6–10.4)
Chloride: 106 mmol/L (ref 98–110)
Creat: 1 mg/dL — ABNORMAL HIGH (ref 0.50–0.99)
GFR, Est African American: 68 mL/min/{1.73_m2} (ref >=60)
GFR, Est Non African American: 58 mL/min/{1.73_m2} — ABNORMAL LOW (ref >=60)
Glucose, Bld: 113 mg/dL — ABNORMAL HIGH (ref 65–99)
Potassium: 4.6 mmol/L (ref 3.5–5.3)
Sodium: 144 mmol/L (ref 135–146)

## 2019-10-17 LAB — HEMOGLOBIN A1C
Hgb A1c MFr Bld: 5.8 % of total Hgb — ABNORMAL HIGH (ref <5.7)
Mean Plasma Glucose: 120 (calc)
eAG (mmol/L): 6.6 (calc)

## 2019-10-17 NOTE — Progress Notes (Signed)
Patient ID: Cassidy Norris, female   DOB: 1951-03-23, 68 y.o.   MRN: [id number]  Chief Complaint:   History of Present Illness Cassidy Norris is a 68 y.o. female with a history of bilateral breast cancer surgery was 5 years ago.  She currently denies any weight loss, abdominal pain, chest pain shortness of breath, appreciated nodularity or masses in either chest wall.  She completed a 5-year course of hormonal blockade.  And is currently seeing her oncologist as well.  We have no images to review.  Past Medical History Past Medical History:  Diagnosis Date  . Anemia   . Arthritis not really diagnosis but have pain  . B12 deficiency 07/23/2016   Will check levels to determine if injections are needed again. Await results. Recent CBC at Onc WNL  . Blood transfusion without reported diagnosis 1983 had a miscarriage/dnc  . Cancer ([place]) 12/30/2013   Multifocal disease: pT1c,m, N0(isolated tumor cells) Er/ PR positive, Her 2 negative.  . Cancer ([place]) 01/09/2014   Invasive lobular carcinoma, 2.2 cm, T2,N1 ER/PR positive, HER-2/neu negative  . Cataract 2014?  Marland Kitchen Diabetes mellitus without complication ([place])   . Diffuse cystic mastopathy   . Essential hypertension, benign 09/24/2017  . Family history of malignant neoplasm of gastrointestinal tract 2012  . Fractured elbow 08/28/14  . Gastroesophageal reflux disease   . Hx of metabolic acidosis with increased anion gap   . Increased heart rate   . Multiple sclerosis ([place])   . Neuromuscular disorder ([place])    neuropathy in bil feet - left is worse.  . Obesity, unspecified   . Peripheral neuropathy   . Personal history of tobacco use, presenting hazards to health   . Restless leg syndrome   . Sleep apnea    uses CPAP  . Special screening for malignant neoplasms, colon       Past Surgical History:  Procedure Laterality Date  . BREAST BIOPSY Right 1973,2001  . BREAST BIOPSY Right 11-07-13   INVASIVE MAMMARY CARCINOMA , ER/PR positive Her2  negative  . BREAST BIOPSY Left 11-27-13   invasive lobular and DCIS  . BREAST SURGERY Bilateral 01/09/14   mastectomy  . CHOLECYSTECTOMY    . COLONOSCOPY  2010   Dr. Jamal Collin  . COLONOSCOPY WITH PROPOFOL N/A 06/11/2015   Procedure: COLONOSCOPY WITH PROPOFOL;  Surgeon: Christene Lye, MD;  Location: [facility] ENDOSCOPY;  Service: Endoscopy;  Laterality: N/A;  . DILATATION & CURETTAGE/HYSTEROSCOPY WITH MYOSURE N/A 07/12/2018   Procedure: DILATATION & CURETTAGE/HYSTEROSCOPY WITH MYOSURE WITH ENDOMETRIAL POLYPECTOMY;  Surgeon: Will Bonnet, MD;  Location: [facility] ORS;  Service: Gynecology;  Laterality: N/A;  . DILATION AND CURETTAGE OF UTERUS  1983  . JOINT REPLACEMENT  11/23/2015  . POLYPECTOMY  1989  . PORTA CATH REMOVAL    . PORTACATH PLACEMENT  2015  . SPINE SURGERY  09/14/14   Ruptured disk L4- L5  . TOTAL KNEE ARTHROPLASTY Left 11/22/2015   Procedure: TOTAL KNEE ARTHROPLASTY;  Surgeon: Earlie Server, MD;  Location: [facility];  Service: Orthopedics;  Laterality: Left;  . TUBAL LIGATION    . WISDOM TOOTH EXTRACTION  2006    No Known Allergies  Current Outpatient Medications  Medication Sig Dispense Refill  . acetaminophen (TYLENOL) 500 MG tablet Take 1,000 mg by mouth 2 (two) times daily as needed for moderate pain or headache.    . baclofen (LIORESAL) 10 MG tablet Take 1 tablet (10 mg total) by mouth 3 (three) times daily as needed for  muscle spasms. 90 each 5  . Calcium Carbonate-Vitamin D (CALCIUM 600+D PO) Take 1 tablet by mouth 2 (two) times daily.    . famotidine (PEPCID) 20 MG tablet Take 1 tablet (20 mg total) by mouth 2 (two) times daily before a meal. 180 tablet 3  . fluticasone (FLONASE) 50 MCG/ACT nasal spray PLACE 2 SPRAYS INTO BOTH NOSTRILS AT BEDTIME. 48 g 1  . lisinopril (ZESTRIL) 5 MG tablet Take 1 tablet (5 mg total) by mouth every evening. 90 tablet 1  . Omega-3 Fatty Acids (FISH OIL) 1000 MG CAPS Take 1,000 mg by mouth at bedtime.    . polyethylene glycol powder  (GLYCOLAX/MIRALAX) powder Take 17 g by mouth 2 (two) times daily as needed for moderate constipation. 3350 g 1  . Pyridoxine HCl (VITAMIN B-6) 500 MG tablet Take 500 mg by mouth daily.    . traZODone (DESYREL) 50 MG tablet TAKE 2 TABLETS AT BEDTIME (Patient taking differently: Take 100 mg by mouth at bedtime. ) 180 tablet 1  . vitamin B-12 (CYANOCOBALAMIN) 1000 MCG tablet Take 1,000 mcg by mouth daily.     No current facility-administered medications for this visit.     Family History Family History  Problem Relation Age of Onset  . Cancer Mother        lung  . COPD Mother   . Cancer Father        colon, stomach  . Diabetes Maternal Grandmother   . Hypertension Sister   . Rectal cancer Paternal Uncle   . Rectal cancer Paternal Uncle       Social History Social History   Tobacco Use  . Smoking status: Former Smoker    Packs/day: 1.00    Years: 30.00    Pack years: 30.00    Types: Cigarettes    Quit date: 12/11/2013    Years since quitting: 5.8  . Smokeless tobacco: Former Network engineer Use Topics  . Alcohol use: No    Alcohol/week: 0.0 standard drinks    Comment: social  . Drug use: No        Review of Systems  Constitutional: Negative.   HENT: Negative.   Eyes: Negative.   Respiratory: Negative.   Cardiovascular: Negative.   Gastrointestinal: Negative.   Skin: Negative.       Physical Exam Blood pressure (!) 147/82, pulse (!) 104, temperature (!) 97.5 F (36.4 C), temperature source Temporal, resp. rate 18, height 5' 4"  (1.626 m), weight 202 lb (91.6 kg), last menstrual period 12/22/1999, SpO2 98 %. Last Weight  Most recent update: 10/17/2019  8:41 AM   Weight  91.6 kg (202 lb)            CONSTITUTIONAL: Well developed, and nourished, appropriately responsive and aware without distress.   EYES: Sclera non-icteric.   EARS, NOSE, MOUTH AND THROAT: Clear face shield worn.  The oropharynx is clear.   Oral mucosa is pink and moist. Hearing is intact to  voice.  NECK: Trachea is midline, and there is no jugular venous distension.  LYMPH NODES:  Lymph nodes in the neck are not enlarged. RESPIRATORY:  Lungs are clear, and breath sounds are equal bilaterally. Normal respiratory effort without pathologic use of accessory muscles. CARDIOVASCULAR: Heart is regular in rate and rhythm. GI: The abdomen is soft, nontender, and nondistended. There were no palpable masses. I did not appreciate hepatosplenomegaly.  GU: Bilateral chest walls consistent with prior mastectomies.  Port-A-Cath site right subcostal area.  All scars are unremarkable.  There is no suspicious nodularity of the skin or subcutaneous tissues of bilateral chest walls and axillary regions. MUSCULOSKELETAL:  Symmetrical muscle tone grossly appreciated.    SKIN: Skin turgor is normal. No pathologic skin lesions appreciated.  NEUROLOGIC:  Motor and sensation appear grossly normal.  Cranial nerves are grossly without defect. PSYCH:  Alert and oriented to person, place and time. Affect is appropriate for situation.  Data Reviewed I have personally reviewed what is currently available of the patient's imaging, recent labs and medical records.       Assessment    History of bilateral mastectomies for bilateral breast cancer, currently 5 years out without evidence of disease. Patient Active Problem List   Diagnosis Date Noted  . Malignant neoplasm of upper-inner quadrant of left breast in female, estrogen receptor positive ([place]) 04/21/2019  . Myalgia due to statin 04/21/2019  . Statin intolerance 07/22/2018  . Multiple sclerosis ([place]) 07/22/2018  . Endometrial polyp 06/27/2018  . Postmenopausal bleeding 06/27/2018  . Gastroesophageal reflux disease 01/20/2018  . History of non anemic vitamin B12 deficiency 10/22/2017  . Former smoker 09/24/2017  . History of bilateral breast cancer 09/24/2017  . Essential hypertension, benign 09/24/2017  . Hyperlipidemia associated with type 2  diabetes mellitus ([place]) 07/13/2017  . Microalbuminuria 01/12/2017  . Type 2 diabetes mellitus with other specified complication ([place]) 27/78/2423  . Osteoarthritis of knee 11/22/2015  . History of colon polyps 10/10/2015  . Family history of colon cancer 10/10/2015  . Restless leg 07/22/2015  . Insomnia 07/22/2015  . Peripheral neuropathy due to chemotherapy ([place]) 03/19/2015  . Lumbar radiculopathy 03/19/2015  . Family history of cancer of digestive system     Plan    We will anticipate follow-up evaluation in 1 year, or as needed.  Face-to-face time spent with the patient and accompanying care providers(if present) was 20 minutes, with more than 50% of the time spent counseling, educating, and coordinating care of the patient.      Ronny Bacon 10/17/2019, 9:27 AM

## 2019-10-17 NOTE — Patient Instructions (Addendum)
Follow-up with our office as needed.  Please call and ask to speak with a nurse if you develop questions or concerns.  We will call you October 2021 to schedule your exam.

## 2019-10-23 ENCOUNTER — Other Ambulatory Visit: Payer: Self-pay

## 2019-10-23 ENCOUNTER — Encounter: Payer: Self-pay | Admitting: Family Medicine

## 2019-10-23 ENCOUNTER — Ambulatory Visit (INDEPENDENT_AMBULATORY_CARE_PROVIDER_SITE_OTHER): Payer: Medicare Other | Admitting: Family Medicine

## 2019-10-23 ENCOUNTER — Ambulatory Visit: Payer: Medicare Other | Admitting: Family Medicine

## 2019-10-23 VITALS — Ht 64.0 in | Wt 201.0 lb

## 2019-10-23 DIAGNOSIS — R7989 Other specified abnormal findings of blood chemistry: Secondary | ICD-10-CM | POA: Diagnosis not present

## 2019-10-23 DIAGNOSIS — F5101 Primary insomnia: Secondary | ICD-10-CM

## 2019-10-23 DIAGNOSIS — I10 Essential (primary) hypertension: Secondary | ICD-10-CM | POA: Diagnosis not present

## 2019-10-23 DIAGNOSIS — E1169 Type 2 diabetes mellitus with other specified complication: Secondary | ICD-10-CM

## 2019-10-23 MED ORDER — TRAZODONE HCL 100 MG PO TABS: 100.0000 mg | ORAL_TABLET | Freq: Every day | ORAL | 1 refills | Status: DC

## 2019-10-23 MED ORDER — VENLAFAXINE HCL ER 75 MG PO CP24: 75.0000 mg | ORAL_CAPSULE | Freq: Every day | ORAL | 1 refills | Status: DC

## 2019-10-23 NOTE — Assessment & Plan Note (Signed)
Well-controlled DM off medication A1c 5.8 No hyperglycemia. No hypoglycemia. Complications - peripheral neuropathy mostly due to chemotherapy toxicity, other including hyperlipidemia, GERD, , obesity, hypothyroidism, OSA - increases risk of future cardiovascular complications  OFF Metformin not needed  Plan:  1. Remain off medication 2. Encourage improved lifestyle - low carb, low sugar diet, reduce portion size, continue improving regular exercise 3. Check CBG if needed 4. Continue ACEi, - refused restart Statin - Due DM Foot. UTD DM Eye 04/2019 Follow-up 6 months A1c

## 2019-10-23 NOTE — Progress Notes (Signed)
Virtual Visit via Telephone The purpose of this virtual visit is to provide medical care while limiting exposure to the novel coronavirus (COVID19) for both patient and office staff.  Consent was obtained for phone visit:  Yes.   Answered questions that patient had about telehealth interaction:  Yes.   I discussed the limitations, risks, security and privacy concerns of performing an evaluation and management service by telephone. I also discussed with the patient that there may be a patient responsible charge related to this service. The patient expressed understanding and agreed to proceed.  Patient Location: Home Provider Location: [facility])  ---------------------------------------------------------------------- Chief Complaint  Patient presents with  . Diabetes    S: Reviewed CMA documentation. I have called patient and gathered additional HPI as follows:   CHRONIC DM, Type 2/ Chemotherapy toxic neuropathy A1c up to 5.8, previous 5.6 range, since coming off metformin 03/2019 CBGs:Avg controlled < 120 Meds:None (off metformin) Reports good compliance. Tolerating well w/o side-effects Currently on ACEi Lifestyle: - Diet (balanced diet, improved carbs and sugars - goal to keep improving) - Exercise (active, improving) L>R bilateral foot neuropathy secondary to chemotherapy Denies hypoglycemia, polyuria, visual changes, numbness or tingling.  CHRONIC HTN / CKD-III Previous result 06/2019 had Creatinine up to 1.16, now repeat lab shows improved Cr 1.00, off meloxicam completely in 06/2019, she was on 15 down to 7.64m in recent times. She was taking it regularly since 2016. She admits increased pain but now doing well off med and taking Tylenol PRN.  since 2015 s/p Left total knee replacement She checks BP regularly Current Meds -Lisinopril 532mOFF Carvedilol Reports good compliance, took meds today. Tolerating well, w/o complaints. Denies CP,  dyspnea, HA, edema, dizziness / lightheadedness  Insomnia She tried to stop a few doses, prior PCP advised her to do a drug holiday to come off of it for 2 weeks then restart but she did not do this since sleep was worse if stopped med. Taking Trazodoone 5072m 2 = 100m26mghtly for years with limited results. Has a dose for tonight then she will run out, asking about new med or other option. She used to take Effexor in past and did well, she had life stressors with husband passing and also she had cancer diagnosis, now she sleeps on her back and not as comfortable.  [facility]aff[facility]ail Sermon MRI testing, confirmed in past. Treated with disease modifying treatment.    Denies any high risk travel to areas of current concern for COVID19. Denies any known or suspected exposure to person with or possibly with COVID19.  Denies any fevers, chills, sweats, body ache, cough, shortness of breath, sinus pain or pressure, headache, abdominal pain, diarrhea  Past Medical History:  Diagnosis Date  . Anemia   . Arthritis not really diagnosis but have pain  . B12 deficiency 07/23/2016   Will check levels to determine if injections are needed again. Await results. Recent CBC at Onc WNL  . Blood transfusion without reported diagnosis 1983 had a miscarriage/dnc  . Cancer (HCC)[place]/05/2014   Multifocal disease: pT1c,m, N0(isolated tumor cells) Er/ PR positive, Her 2 negative.  . Cancer (H[facility])[place]/17/2015   Invasive lobular carcinoma, 2.2 cm, T2,N1 ER/PR positive, HER-2/neu negative  . Cataract 2014?  . DiMarland Kitchenbetes mellitus without complication (HCC)[place]. Diffuse cystic mastopathy   . Essential hypertension, benign 09/24/2017  . Family history of malignant neoplasm of gastrointestinal tract 2012  . Fractured  elbow 08/28/14  . Gastroesophageal reflux disease   . Hx of metabolic acidosis with increased anion gap   . Increased heart rate   . Multiple sclerosis ([place])   .  Neuromuscular disorder ([place])    neuropathy in bil feet - left is worse.  . Obesity, unspecified   . Peripheral neuropathy   . Personal history of tobacco use, presenting hazards to health   . Restless leg syndrome   . Sleep apnea    uses CPAP  . Special screening for malignant neoplasms, colon    Social History   Tobacco Use  . Smoking status: Former Smoker    Packs/day: 1.00    Years: 30.00    Pack years: 30.00    Types: Cigarettes    Quit date: 12/11/2013    Years since quitting: 5.8  . Smokeless tobacco: Former Network engineer Use Topics  . Alcohol use: No    Alcohol/week: 0.0 standard drinks    Comment: social  . Drug use: No    Current Outpatient Medications:  .  acetaminophen (TYLENOL) 500 MG tablet, Take 1,000 mg by mouth 2 (two) times daily as needed for moderate pain or headache., Disp: , Rfl:  .  baclofen (LIORESAL) 10 MG tablet, Take 1 tablet (10 mg total) by mouth 3 (three) times daily as needed for muscle spasms., Disp: 90 each, Rfl: 5 .  calcium carbonate (CALCIUM 600) 1500 (600 Ca) MG TABS tablet, Take by mouth 2 (two) times daily with a meal., Disp: , Rfl:  .  famotidine (PEPCID) 20 MG tablet, Take 1 tablet (20 mg total) by mouth 2 (two) times daily before a meal., Disp: 180 tablet, Rfl: 3 .  fluticasone (FLONASE) 50 MCG/ACT nasal spray, PLACE 2 SPRAYS INTO BOTH NOSTRILS AT BEDTIME., Disp: 48 g, Rfl: 1 .  lisinopril (ZESTRIL) 5 MG tablet, Take 1 tablet (5 mg total) by mouth every evening., Disp: 90 tablet, Rfl: 1 .  Omega-3 Fatty Acids (FISH OIL) 1000 MG CAPS, Take 1,000 mg by mouth 3 (three) times daily. , Disp: , Rfl:  .  polyethylene glycol powder (GLYCOLAX/MIRALAX) powder, Take 17 g by mouth 2 (two) times daily as needed for moderate constipation., Disp: 3350 g, Rfl: 1 .  Pyridoxine HCl (VITAMIN B-6) 500 MG tablet, Take 500 mg by mouth daily., Disp: , Rfl:  .  vitamin B-12 (CYANOCOBALAMIN) 1000 MCG tablet, Take 1,000 mcg by mouth daily., Disp: , Rfl:  .   Vitamin D, Cholecalciferol, 50 MCG (2000 UT) CAPS, Take 1 capsule by mouth every 3 (three) days., Disp: , Rfl:  .  traZODone (DESYREL) 100 MG tablet, Take 1 tablet (100 mg total) by mouth at bedtime. May cut in half for 73m if needed, Disp: 90 tablet, Rfl: 1 .  venlafaxine XR (EFFEXOR-XR) 75 MG 24 hr capsule, Take 1 capsule (75 mg total) by mouth daily with breakfast., Disp: 90 capsule, Rfl: 1   [facility] 05/09/2019 07/22/2018 09/24/2017  Decreased Interest 0 0 0  Down, Depressed, Hopeless 0 0 0  PHQ - 2 Score 0 0 0  Altered sleeping - - 2  Tired, decreased energy - - 3  Change in appetite - - 3  Feeling bad or failure about yourself  - - 0  Trouble concentrating - - 0  Moving slowly or fidgety/restless - - 1  Suicidal thoughts - - 0  PHQ-9 Score - - 9  Some recent data might be hidden    No flowsheet data  found.  -------------------------------------------------------------------------- O: No physical exam performed due to remote telephone encounter.  Lab results reviewed.  Recent Results (from the past 2160 hour(s))  Hemoglobin A1c     Status: Abnormal   Collection Time: 10/16/19  8:45 AM  Result Value Ref Range   Hgb A1c MFr Bld 5.8 (H) <5.7 % of total Hgb    Comment: For someone without known diabetes, a hemoglobin  A1c value between 5.7% and 6.4% is consistent with prediabetes and should be confirmed with a  follow-up test. . For someone with known diabetes, a value <7% indicates that their diabetes is well controlled. A1c targets should be individualized based on duration of diabetes, age, comorbid conditions, and other considerations. . This assay result is consistent with an increased risk of diabetes. . Currently, no consensus exists regarding use of hemoglobin A1c for diagnosis of diabetes for children. .    Mean Plasma Glucose 120 (calc)   eAG (mmol/L) 6.6 (calc)  BASIC METABOLIC PANEL WITH GFR     Status: Abnormal   Collection Time:  10/16/19  8:45 AM  Result Value Ref Range   Glucose, Bld 113 (H) 65 - 99 mg/dL    Comment: .            Fasting reference interval . For someone without known diabetes, a glucose value between 100 and 125 mg/dL is consistent with prediabetes and should be confirmed with a follow-up test. .    BUN 13 7 - 25 mg/dL   Creat 1.00 (H) 0.50 - 0.99 mg/dL    Comment: For patients >53 years of age, the reference limit for Creatinine is approximately 13% higher for people identified as African-American. .    GFR, Est Non African American 58 (L) > OR = 60 mL/min/1.31m   GFR, Est African American 68 > OR = 60 mL/min/1.733m  BUN/Creatinine Ratio 13 6 - 22 (calc)   Sodium 144 135 - 146 mmol/L   Potassium 4.6 3.5 - 5.3 mmol/L   Chloride 106 98 - 110 mmol/L   CO2 24 20 - 32 mmol/L   Calcium 9.4 8.6 - 10.4 mg/dL    -------------------------------------------------------------------------- A&P:  Problem List Items Addressed This Visit    Type 2 diabetes mellitus with other specified complication ([place]   Well-controlled DM off medication A1c 5.8 No hyperglycemia. No hypoglycemia. Complications - peripheral neuropathy mostly due to chemotherapy toxicity, other including hyperlipidemia, GERD, , obesity, hypothyroidism, OSA - increases risk of future cardiovascular complications  OFF Metformin not needed  Plan:  1. Remain off medication 2. Encourage improved lifestyle - low carb, low sugar diet, reduce portion size, continue improving regular exercise 3. Check CBG if needed 4. Continue ACEi, - refused restart Statin - Due DM Foot. UTD DM Eye 04/2019 Follow-up 6 months A1c      Insomnia - Primary    Chronic insomnia, suspected component of primary trigger, can also be mood / anxiety related based on past history. Also positioning and comfort.  Plan - Restart SNRI Effexor previously on with good results 7542maily - Continue Trazodone 100m78mghtly (re order as 100mg34ms - instead of  50mg 45m- in future can taper off or down on Trazodone if no longer needed      Relevant Medications   traZODone (DESYREL) 100 MG tablet   venlafaxine XR (EFFEXOR-XR) 75 MG 24 hr capsule   Essential hypertension, benign    Well-controlled HTN - Home BP readings normal  Elevated Creatinine is  improved from 1.16 to 1.0, still has mild reduced GFR - now off NSAID meloxicam OFF Carvedilol from prior visit 03/2018, not indicated if normal HR and no cardiac etiology.    Plan:  1. Continue current BP regimen - Lisinopril 94m daily 2. Encourage improved lifestyle - low sodium diet, regular exercise 3. Continue monitor BP outside office, bring readings to next visit, if persistently >140/90 or new symptoms notify office sooner       Other Visit Diagnoses    Elevated serum creatinine       Improved 1.16 to 1.0 off meloxicam, monitor every 6 months      Meds ordered this encounter  Medications  . traZODone (DESYREL) 100 MG tablet    Sig: Take 1 tablet (100 mg total) by mouth at bedtime. May cut in half for 5106mif needed    Dispense:  90 tablet    Refill:  1  . venlafaxine XR (EFFEXOR-XR) 75 MG 24 hr capsule    Sig: Take 1 capsule (75 mg total) by mouth daily with breakfast.    Dispense:  90 capsule    Refill:  1    Follow-up: - Return in 6 month DM A1c, Insomnia  She will have lab draw from Dr JaLoretta Plumen 12/2019 - may use this for our next visit.  Patient verbalizes understanding with the above medical recommendations including the limitation of remote medical advice.  Specific follow-up and call-back criteria were given for patient to follow-up or seek medical care more urgently if needed.   - Time spent in direct consultation with patient on phone: 12 minutes  AlNobie PutnamDOBeltramiroup 10/23/2019, 9:45 AM

## 2019-10-23 NOTE — Assessment & Plan Note (Signed)
Chronic insomnia, suspected component of primary trigger, can also be mood / anxiety related based on past history. Also positioning and comfort.  Plan - Restart SNRI Effexor previously on with good results 75mg  daily - Continue Trazodone 100mg  nightly (re order as 100mg  tabs - instead of 50mg  x2) - in future can taper off or down on Trazodone if no longer needed

## 2019-10-23 NOTE — Assessment & Plan Note (Signed)
Well-controlled HTN - Home BP readings normal  Elevated Creatinine is improved from 1.16 to 1.0, still has mild reduced GFR - now off NSAID meloxicam OFF Carvedilol from prior visit 03/2018, not indicated if normal HR and no cardiac etiology.    Plan:  1. Continue current BP regimen - Lisinopril 5mg  daily 2. Encourage improved lifestyle - low sodium diet, regular exercise 3. Continue monitor BP outside office, bring readings to next visit, if persistently >140/90 or new symptoms notify office sooner

## 2019-10-23 NOTE — Patient Instructions (Addendum)
Thank you for coming to the office today.  Start Effexor 75mg  once daily now, may take 2-4 weeks to get full benefit, continue Trazodone 100mg  nightly if you need to reduce to 50mg  that is fine, if after >4 weeks sleep is improved and you want to try coming down or off Trazodone that is fine too.  Recent Labs    04/14/19 0801 10/16/19 0845  HGBA1C 5.6 5.8*   Kidney function improved.  Please schedule a Follow-up Appointment to: Return in about 6 months (around 04/21/2020) for 6 month DM A1c, Insomnia.  If you have any other questions or concerns, please feel free to call the office or send a message through [place]. You may also schedule an earlier appointment if necessary.  Additionally, you may be receiving a survey about your experience at our office within a few days to 1 week by e-mail or mail. We value your feedback.  Nobie Putnam, DO [place]

## 2019-11-30 DIAGNOSIS — J301 Allergic rhinitis due to pollen: Secondary | ICD-10-CM | POA: Diagnosis not present

## 2019-11-30 DIAGNOSIS — G4733 Obstructive sleep apnea (adult) (pediatric): Secondary | ICD-10-CM | POA: Diagnosis not present

## 2019-12-28 ENCOUNTER — Other Ambulatory Visit (INDEPENDENT_AMBULATORY_CARE_PROVIDER_SITE_OTHER): Payer: Medicare Other

## 2019-12-28 ENCOUNTER — Other Ambulatory Visit: Payer: Self-pay

## 2019-12-28 DIAGNOSIS — E559 Vitamin D deficiency, unspecified: Secondary | ICD-10-CM

## 2019-12-28 DIAGNOSIS — M1711 Unilateral primary osteoarthritis, right knee: Secondary | ICD-10-CM | POA: Diagnosis not present

## 2019-12-28 DIAGNOSIS — G35 Multiple sclerosis: Secondary | ICD-10-CM | POA: Diagnosis not present

## 2019-12-28 DIAGNOSIS — Z79899 Other long term (current) drug therapy: Secondary | ICD-10-CM | POA: Diagnosis not present

## 2019-12-28 LAB — COMPREHENSIVE METABOLIC PANEL
ALT: 49 U/L — ABNORMAL HIGH (ref 0–35)
AST: 45 U/L — ABNORMAL HIGH (ref 0–37)
Albumin: 4.2 g/dL (ref 3.5–5.2)
Alkaline Phosphatase: 62 U/L (ref 39–117)
BUN: 13 mg/dL (ref 6–23)
CO2: 28 mEq/L (ref 19–32)
Calcium: 9.8 mg/dL (ref 8.4–10.5)
Chloride: 105 mEq/L (ref 96–112)
Creatinine, Ser: 0.9 mg/dL (ref 0.40–1.20)
GFR: 62.4 mL/min (ref >60.00)
Glucose, Bld: 119 mg/dL — ABNORMAL HIGH (ref 70–99)
Potassium: 4.2 mEq/L (ref 3.5–5.1)
Sodium: 141 mEq/L (ref 135–145)
Total Bilirubin: 0.6 mg/dL (ref 0.2–1.2)
Total Protein: 7.1 g/dL (ref 6.0–8.3)

## 2019-12-28 LAB — VITAMIN D 25 HYDROXY (VIT D DEFICIENCY, FRACTURES)
VITD: 72.86 ng/mL (ref 30.00–100.00)
Vit D, 25-Hydroxy: 51 ng/mL (ref 30–100)

## 2019-12-28 LAB — CBC WITH DIFFERENTIAL/PLATELET
Basophils Absolute: 0 10*3/uL (ref 0.0–0.1)
Basophils Relative: 0.7 % (ref 0.0–3.0)
Eosinophils Absolute: 0.2 10*3/uL (ref 0.0–0.7)
Eosinophils Relative: 3.2 % (ref 0.0–5.0)
HCT: 40.4 % (ref 36.0–46.0)
Hemoglobin: 13.6 g/dL (ref 12.0–15.0)
Lymphocytes Relative: 24.5 % (ref 12.0–46.0)
Lymphs Abs: 1.4 10*3/uL (ref 0.7–4.0)
MCHC: 33.6 g/dL (ref 30.0–36.0)
MCV: 95.5 fl (ref 78.0–100.0)
Monocytes Absolute: 0.7 10*3/uL (ref 0.1–1.0)
Monocytes Relative: 11.9 % (ref 3.0–12.0)
Neutro Abs: 3.4 10*3/uL (ref 1.4–7.7)
Neutrophils Relative %: 59.7 % (ref 43.0–77.0)
Platelets: 199 10*3/uL (ref 150.0–400.0)
RBC: 4.23 Mil/uL (ref 3.87–5.11)
RDW: 13.4 % (ref 11.5–15.5)
WBC: 5.7 10*3/uL (ref 4.0–10.5)

## 2020-01-02 NOTE — Progress Notes (Signed)
NEUROLOGY FOLLOW UP OFFICE NOTE  Cassidy Norris [id number]  HISTORY OF PRESENT ILLNESS: Cassidy Norris is a 69 year old right-handed woman with multiple sclerosis, former smoker and history of bilateral breast cancer 2015 s/p bilateral mastectomy and s/p L4-5 laminectomy who follows up for multiple sclerosis. MRI from September personally reviewed.  UPDATE: Current disease modifying therapy:  none Other medications:  baclofen 32m twice daily; D3 2000 IU three times a week  08/10/2019 MRI BRAIN W WO:  Multiple T2/FLAIR hyperintense foci without enhancement, stable compared to imaging from 03/27/2018.  12/28/2019 LABS:  CBC with WBC 5.7, HGB 13.6, HCT 40.4, PLT 199, ALC 1.4; CMP with Na 141, K 4.2, Cl 105, CO2 28, glucose 119, BUN 13, Cr 0.90, t bili 0.6, ALP 62, AST 45, ALT 49; vitamin D 25-hydroxy 72.86.  Vision:No change Motor: No issues Sensory: Neuropathy in feet, stable Pain: Still having muscle cramps and neuropathic pain in legs at night.  Often wakes up in middle of the night with leg pain/cramps.  Gait: okay Bowel/Bladder: no Fatigue: No.  Still having trouble sleeping despite trazodone.   Cognition: Stable Mood: Okay  HISTORY: In November 2001, she tripped and fell, hitting her head.She went to the ED where a CT of the head was reportedly unremarkable.She was advised to have an MRI of the brain, which was performed on 12/12/00.It revealed small round and elliptical white matter lesions less than 1 cm in the periventricular region.In December of 2002, she followed up with a neurologist, Dr. PCorinna CapraVEP performed in 2003 showed prolonged p100 latency in the right optic nerve.BAER was normal.She reportedly had a lumbar puncture where CSF results were supportive of MS.She was given 5 days of Solu-Medrol followed by 3 weeks of oral prednisone.She was advised to start an interferon, but due to possible side effects, she decided to hold off and  see if she clinically progresses.She did have a follow up MRI of the brain in December 2004, which reportedly showed an enhancing lesion as well as a remote lesion in the cerebellum in addition to the periventricular white matter lesions.She never had any noticeable clinical flare-ups and had done well off of a disease-modifying agent.Over the pastfewyears, she notes possible worsening in depth perception.Sometimes she notes difficulty grabbing for objects or will drive too close to the curb.  She had an MRI of the brain and cervical spine with and without contrast on 03/12/15, which showed non-enhancing bilateral periventricular white matter lesions oriented perpendicular to the ventricles and corpus callosum, as well as involving temporal lobe white matter and mild involvement of the left side of cerebellum.No cervical cord lesions were seen. Repeat MRI of brain with and without contrast from 01/28/17 was personally reviewed and demonstrated "two small new or increased right hemisphere subcortical white matter lesions when compared to 2016"with no enhancement or active demyelination. Otherwise, MRI is stable.  Repeat MRI of brain with and without contrast from 01/25/18 was personally reviewed and showed stable chronic white matter hyperintensities consistent with MS.  In 2015, she was diagnosed with bilateral breast cancer.She underwent bilateral mastectomy and chemotherapy, which ended in September.As a result of the chemotherapy, she has neuropathy in the feet.She also had problems with her left leg and was found to have a ruptured disc at L4-5 and underwent surgery in October 2015.She has baseline numbness over the dorsum of left foot and lateral leg. The toes in her left foot sometimes curl. Since chemotherapy, she notes some mild memory problems.She sometimes misplaces  objects or will forget if she locked the front door when she left.She will sometimes have difficulty getting a  word out.  Past DMT:  Tecfidera (stopped in early 2020.  She didn't feel well on it - slurred speech, worse balance.  Also, she was tired with fighting with the insurance company to get it)  PAST MEDICAL HISTORY: Past Medical History:  Diagnosis Date  . Anemia   . Arthritis not really diagnosis but have pain  . B12 deficiency 07/23/2016   Will check levels to determine if injections are needed again. Await results. Recent CBC at Onc WNL  . Blood transfusion without reported diagnosis 1983 had a miscarriage/dnc  . Cancer ([place]) 12/30/2013   Multifocal disease: pT1c,m, N0(isolated tumor cells) Er/ PR positive, Her 2 negative.  . Cancer ([place]) 01/09/2014   Invasive lobular carcinoma, 2.2 cm, T2,N1 ER/PR positive, HER-2/neu negative  . Cataract 2014?  Marland Kitchen Diabetes mellitus without complication ([place])   . Diffuse cystic mastopathy   . Essential hypertension, benign 09/24/2017  . Family history of malignant neoplasm of gastrointestinal tract 2012  . Fractured elbow 08/28/14  . Gastroesophageal reflux disease   . Hx of metabolic acidosis with increased anion gap   . Increased heart rate   . Multiple sclerosis ([place])   . Neuromuscular disorder ([place])    neuropathy in bil feet - left is worse.  . Obesity, unspecified   . Peripheral neuropathy   . Personal history of tobacco use, presenting hazards to health   . Restless leg syndrome   . Sleep apnea    uses CPAP  . Special screening for malignant neoplasms, colon     MEDICATIONS: Current Outpatient Medications on File Prior to Visit  Medication Sig Dispense Refill  . acetaminophen (TYLENOL) 500 MG tablet Take 1,000 mg by mouth 2 (two) times daily as needed for moderate pain or headache.    . baclofen (LIORESAL) 10 MG tablet Take 1 tablet (10 mg total) by mouth 3 (three) times daily as needed for muscle spasms. 90 each 5  . calcium carbonate (CALCIUM 600) 1500 (600 Ca) MG TABS tablet Take by mouth 2 (two) times daily with a meal.    .  famotidine (PEPCID) 20 MG tablet Take 1 tablet (20 mg total) by mouth 2 (two) times daily before a meal. 180 tablet 3  . fluticasone (FLONASE) 50 MCG/ACT nasal spray PLACE 2 SPRAYS INTO BOTH NOSTRILS AT BEDTIME. 48 g 1  . lisinopril (ZESTRIL) 5 MG tablet Take 1 tablet (5 mg total) by mouth every evening. 90 tablet 1  . Omega-3 Fatty Acids (FISH OIL) 1000 MG CAPS Take 1,000 mg by mouth 3 (three) times daily.     . polyethylene glycol powder (GLYCOLAX/MIRALAX) powder Take 17 g by mouth 2 (two) times daily as needed for moderate constipation. 3350 g 1  . Pyridoxine HCl (VITAMIN B-6) 500 MG tablet Take 500 mg by mouth daily.    . traZODone (DESYREL) 100 MG tablet Take 1 tablet (100 mg total) by mouth at bedtime. May cut in half for 16m if needed 90 tablet 1  . venlafaxine XR (EFFEXOR-XR) 75 MG 24 hr capsule Take 1 capsule (75 mg total) by mouth daily with breakfast. 90 capsule 1  . vitamin B-12 (CYANOCOBALAMIN) 1000 MCG tablet Take 1,000 mcg by mouth daily.    . Vitamin D, Cholecalciferol, 50 MCG (2000 UT) CAPS Take 1 capsule by mouth every 3 (three) days.     No current facility-administered medications on file  prior to visit.    ALLERGIES: No Known Allergies  FAMILY HISTORY: Family History  Problem Relation Age of Onset  . Cancer Mother        lung  . COPD Mother   . Cancer Father        colon, stomach  . Diabetes Maternal Grandmother   . Hypertension Sister   . Rectal cancer Paternal Uncle   . Rectal cancer Paternal Uncle     SOCIAL HISTORY: Social History   Socioeconomic History  . Marital status: Widowed    Spouse name: Not on file  . Number of children: Not on file  . Years of education: some college  . Highest education level: High school graduate  Occupational History  . Occupation: retired  Tobacco Use  . Smoking status: Former Smoker    Packs/day: 1.00    Years: 30.00    Pack years: 30.00    Types: Cigarettes    Quit date: 12/11/2013    Years since quitting: 6.0   . Smokeless tobacco: Former Network engineer and Sexual Activity  . Alcohol use: No    Alcohol/week: 0.0 standard drinks    Comment: social  . Drug use: No  . Sexual activity: Never  Other Topics Concern  . Not on file  Social History Narrative  . Not on file   Social Determinants of Health   Financial Resource Strain: Low Risk   . Difficulty of Paying Living Expenses: Not hard at all  Food Insecurity: No Food Insecurity  . Worried About Charity fundraiser in the Last Year: Never true  . Ran Out of Food in the Last Year: Never true  Transportation Needs: No Transportation Needs  . Lack of Transportation (Medical): No  . Lack of Transportation (Non-Medical): No  Physical Activity: Inactive  . Days of Exercise per Week: 0 days  . Minutes of Exercise per Session: 0 min  Stress:   . Feeling of Stress : Not on file  Social Connections:   . Frequency of Communication with Friends and Family: Not on file  . Frequency of Social Gatherings with Friends and Family: Not on file  . Attends Religious Services: Not on file  . Active Member of Clubs or Organizations: Not on file  . Attends Archivist Meetings: Not on file  . Marital Status: Not on file  Intimate Partner Violence:   . Fear of Current or Ex-Partner: Not on file  . Emotionally Abused: Not on file  . Physically Abused: Not on file  . Sexually Abused: Not on file    REVIEW OF SYSTEMS: Constitutional: No fevers, chills, or sweats, no generalized fatigue, change in appetite Eyes: No visual changes, double vision, eye pain Ear, nose and throat: No hearing loss, ear pain, nasal congestion, sore throat Cardiovascular: No chest pain, palpitations Respiratory:  No shortness of breath at rest or with exertion, wheezes GastrointestinaI: No nausea, vomiting, diarrhea, abdominal pain, fecal incontinence Genitourinary:  No dysuria, urinary retention or frequency Musculoskeletal:  No neck pain, back pain Integumentary:  No rash, pruritus, skin lesions Neurological: as above Psychiatric: No depression, insomnia, anxiety Endocrine: No palpitations, fatigue, diaphoresis, mood swings, change in appetite, change in weight, increased thirst Hematologic/Lymphatic:  No purpura, petechiae. Allergic/Immunologic: no itchy/runny eyes, nasal congestion, recent allergic reactions, rashes  PHYSICAL EXAM: Blood pressure 113/68, pulse (!) 109, height 5' 4"  (1.626 m), weight 201 lb (91.2 kg), last menstrual period 12/22/1999, SpO2 97 %. General: No acute distress.  Patient appears  well-groomed.   Head:  Normocephalic/atraumatic Eyes:  Fundi examined but not visualized Neck: supple, no paraspinal tenderness, full range of motion Heart:  Regular rate and rhythm Lungs:  Clear to auscultation bilaterally Back: No paraspinal tenderness Neurological Exam: alert and oriented to person, place, and time. Attention span and concentration intact, recent and remote memory intact, fund of knowledge intact.  Speech fluent and not dysarthric, language intact.  CN II-XII intact. Bulk and tone normal, muscle strength 5/5 throughout.  Sensation to temperature and vibration reduced in feet.  Deep tendon reflexes trace in upper extremities and absent in lower extremitiest, toes downgoing.  Finger to nose and heel to shin testing intact.  Gait normal.  Romberg negative.  IMPRESSION: Multiple sclerosis.  Off DMT.  At this time she wishes to remain off of DMT Peripheral neuropathy  PLAN: 1.  D3 2000 IU three days a week 3.  Baclofen for muscle cramps. 4.  Repeat MRI of brain with and without contrast in one year 5.  She has mildly elevated liver enzymes.  Will reach out to her PCP to determine if further workup is needed. 5.  Follow up in 1 year (following repeat MRI)  Cassidy Clines, DO  CC:  Nobie Putnam, DO

## 2020-01-04 ENCOUNTER — Encounter: Payer: Self-pay | Admitting: Neurology

## 2020-01-04 ENCOUNTER — Ambulatory Visit (INDEPENDENT_AMBULATORY_CARE_PROVIDER_SITE_OTHER): Payer: Medicare Other | Admitting: Neurology

## 2020-01-04 ENCOUNTER — Encounter: Payer: Self-pay | Admitting: Family Medicine

## 2020-01-04 ENCOUNTER — Other Ambulatory Visit: Payer: Self-pay

## 2020-01-04 VITALS — BP 113/68 | HR 109 | Ht 64.0 in | Wt 201.0 lb

## 2020-01-04 DIAGNOSIS — R7989 Other specified abnormal findings of blood chemistry: Secondary | ICD-10-CM | POA: Insufficient documentation

## 2020-01-04 DIAGNOSIS — G620 Drug-induced polyneuropathy: Secondary | ICD-10-CM

## 2020-01-04 DIAGNOSIS — G35 Multiple sclerosis: Secondary | ICD-10-CM | POA: Diagnosis not present

## 2020-01-04 DIAGNOSIS — T451X5A Adverse effect of antineoplastic and immunosuppressive drugs, initial encounter: Secondary | ICD-10-CM | POA: Diagnosis not present

## 2020-01-04 MED ORDER — BACLOFEN 10 MG PO TABS: 10.0000 mg | ORAL_TABLET | Freq: Three times a day (TID) | ORAL | 3 refills | Status: DC | PRN

## 2020-01-04 NOTE — Patient Instructions (Addendum)
1.  I will send the labs to your PCP regarding the liver enzymes 2.  Continue baclofen and vitamin D 3.  Follow up in one year.  Repeat MRI of brain prior to follow up. Call prior to your appointment so that we can get the MRI approved by insurance then scheduled.  [facility] [phone]

## 2020-01-31 DIAGNOSIS — M461 Sacroiliitis, not elsewhere classified: Secondary | ICD-10-CM | POA: Diagnosis not present

## 2020-01-31 DIAGNOSIS — M9904 Segmental and somatic dysfunction of sacral region: Secondary | ICD-10-CM | POA: Diagnosis not present

## 2020-01-31 DIAGNOSIS — M9903 Segmental and somatic dysfunction of lumbar region: Secondary | ICD-10-CM | POA: Diagnosis not present

## 2020-04-19 ENCOUNTER — Ambulatory Visit (INDEPENDENT_AMBULATORY_CARE_PROVIDER_SITE_OTHER): Payer: Medicare Other | Admitting: Family Medicine

## 2020-04-19 ENCOUNTER — Other Ambulatory Visit: Payer: Self-pay | Admitting: Family Medicine

## 2020-04-19 ENCOUNTER — Other Ambulatory Visit: Payer: Self-pay

## 2020-04-19 ENCOUNTER — Encounter: Payer: Self-pay | Admitting: Family Medicine

## 2020-04-19 VITALS — BP 117/71 | HR 82 | Temp 97.5°F | Ht 64.0 in | Wt 206.6 lb

## 2020-04-19 DIAGNOSIS — K219 Gastro-esophageal reflux disease without esophagitis: Secondary | ICD-10-CM

## 2020-04-19 DIAGNOSIS — F5101 Primary insomnia: Secondary | ICD-10-CM | POA: Diagnosis not present

## 2020-04-19 DIAGNOSIS — R809 Proteinuria, unspecified: Secondary | ICD-10-CM | POA: Diagnosis not present

## 2020-04-19 DIAGNOSIS — E1169 Type 2 diabetes mellitus with other specified complication: Secondary | ICD-10-CM

## 2020-04-19 DIAGNOSIS — M791 Myalgia, unspecified site: Secondary | ICD-10-CM | POA: Diagnosis not present

## 2020-04-19 DIAGNOSIS — G35 Multiple sclerosis: Secondary | ICD-10-CM

## 2020-04-19 DIAGNOSIS — Z8639 Personal history of other endocrine, nutritional and metabolic disease: Secondary | ICD-10-CM

## 2020-04-19 DIAGNOSIS — E559 Vitamin D deficiency, unspecified: Secondary | ICD-10-CM

## 2020-04-19 DIAGNOSIS — M170 Bilateral primary osteoarthritis of knee: Secondary | ICD-10-CM

## 2020-04-19 DIAGNOSIS — I10 Essential (primary) hypertension: Secondary | ICD-10-CM | POA: Diagnosis not present

## 2020-04-19 DIAGNOSIS — E785 Hyperlipidemia, unspecified: Secondary | ICD-10-CM

## 2020-04-19 LAB — POCT GLYCOSYLATED HEMOGLOBIN (HGB A1C): Hemoglobin A1C: 6.4 % — AB (ref 4.0–5.6)

## 2020-04-19 MED ORDER — RYBELSUS 3 MG PO TABS: 3.0000 mg | ORAL_TABLET | Freq: Every day | ORAL | 0 refills | Status: DC

## 2020-04-19 MED ORDER — DICLOFENAC SODIUM 1 % EX GEL: 2.0000 g | Freq: Three times a day (TID) | CUTANEOUS | 2 refills | Status: AC | PRN

## 2020-04-19 MED ORDER — TRAZODONE HCL 100 MG PO TABS: 100.0000 mg | ORAL_TABLET | Freq: Every day | ORAL | 3 refills | Status: DC

## 2020-04-19 MED ORDER — LISINOPRIL 5 MG PO TABS: 5.0000 mg | ORAL_TABLET | Freq: Every evening | ORAL | 3 refills | Status: DC

## 2020-04-19 MED ORDER — FAMOTIDINE 20 MG PO TABS: 20.0000 mg | ORAL_TABLET | Freq: Two times a day (BID) | ORAL | 3 refills | Status: DC

## 2020-04-19 NOTE — Progress Notes (Signed)
Subjective:    Patient ID: Cassidy Norris, female    DOB: June 07, 1951, 69 y.o.   MRN: [id number]  Cassidy Norris is a 68 y.o. female presenting on 04/19/2020 for Diabetes   HPI   CHRONIC DM, Type 2/ Chemotherapy toxic neuropathy A1c up to 5.8, previous 5.6 range, since coming off metformin 03/2019 Due for A1c today, has had elevated CBGs Meds:None (off metformin) Reports good compliance. Tolerating well w/o side-effects Currently on ACEi Lifestyle: - Diet (balanced diet, improved carbs and sugars - goal to keep improving) - Exercise (active, improving) L>R bilateral foot neuropathy secondary to chemotherapy Denies hypoglycemia, polyuria, visual changes, numbness or tingling.  CHRONIC HTN / CKD-III Prior elevated creatinine. Last 12/2019 improved to 0.90 She checks BP regularly Current Meds -Lisinopril 5mg  OFF Carvedilol Reports good compliance, took meds today. Tolerating well, w/o complaints. Denies CP, dyspnea, HA, edema, dizziness / lightheadedness  Insomnia Taking Trazodoone 100mg , Effexor 75mg  doing well  Multiple Sclerosis Followed by [facility] Neu[facility] Dr Michail Sermon had MRI testing, confirmed in past. Treated with disease modifying treatment.   Hyperlipidemia Secondary to T2DM, last Lipid 03/2019, elevated TG 257, low HDL, LDL at 98 Not on statin therapy now due to myopathy  Osteoarthritis Bilateral Knee / Chronic Knee Pain S/p Left Knee Repair - No longer taking meloxicam - Has not tried Voltaren  GERD Controlled on Famotidine.   Depression screen [facility] 2/9 04/19/2020 05/09/2019 07/22/2018  Decreased Interest 0 0 0  Down, Depressed, Hopeless 0 0 0  PHQ - 2 Score 0 0 0  Altered sleeping - - -  Tired, decreased energy - - -  Change in appetite - - -  Feeling bad or failure about yourself  - - -  Trouble concentrating - - -  Moving slowly or fidgety/restless - - -  Suicidal thoughts - - -  PHQ-9 Score - - -  Some recent data might be hidden     Social History   Tobacco Use  . Smoking status: Former Smoker    Packs/day: 1.00    Years: 30.00    Pack years: 30.00    Types: Cigarettes    Quit date: 12/11/2013    Years since quitting: 6.3  . Smokeless tobacco: Former Network engineer Use Topics  . Alcohol use: No    Alcohol/week: 0.0 standard drinks    Comment: social  . Drug use: No    Review of Systems Per HPI unless specifically indicated above     Objective:    BP 117/71   Pulse 82   Temp (!) 97.5 F (36.4 C) (Temporal)   Ht 5\' 4"  (1.626 m)   Wt 206 lb 9.6 oz (93.7 kg)   LMP 12/22/1999 (Approximate)   SpO2 (!) 16%   BMI 35.46 kg/m   Wt Readings from Last 3 Encounters:  04/19/20 206 lb 9.6 oz (93.7 kg)  01/04/20 201 lb (91.2 kg)  10/23/19 201 lb (91.2 kg)    Physical Exam Vitals and nursing note reviewed.  Constitutional:      General: She is not in acute distress.    Appearance: She is well-developed. She is not diaphoretic.     Comments: Well-appearing, comfortable, cooperative  HENT:     Head: Normocephalic and atraumatic.  Eyes:     General:        Right eye: No discharge.        Left eye: No discharge.     Conjunctiva/sclera: Conjunctivae normal.  Neck:  Thyroid: No thyromegaly.  Cardiovascular:     Rate and Rhythm: Normal rate and regular rhythm.     Heart sounds: Normal heart sounds. No murmur.  Pulmonary:     Effort: Pulmonary effort is normal. No respiratory distress.     Breath sounds: Normal breath sounds. No wheezing or rales.  Musculoskeletal:        General: Normal range of motion.     Cervical back: Normal range of motion and neck supple.  Lymphadenopathy:     Cervical: No cervical adenopathy.  Skin:    General: Skin is warm and dry.     Findings: No erythema or rash.  Neurological:     Mental Status: She is alert and oriented to person, place, and time.  Psychiatric:        Behavior: Behavior normal.     Comments: Well groomed, good eye contact, normal speech and  thoughts      Diabetic Foot Exam - Simple   Simple Foot Form Diabetic Foot exam was performed with the following findings: Yes 04/19/2020  8:31 AM  Visual Inspection See comments: Yes Sensation Testing See comments: Yes Pulse Check Posterior Tibialis and Dorsalis pulse intact bilaterally: Yes Comments Reduced monofilament sensation Left greater than Right mostly heel and forefoot. Callus formation. No ulceration.    Recent Labs    10/16/19 0845 04/19/20 0811  HGBA1C 5.8* 6.4*     Results for orders placed or performed in visit on 04/19/20  POCT HgB A1C  Result Value Ref Range   Hemoglobin A1C 6.4 (A) 4.0 - 5.6 %      Assessment & Plan:   Problem List Items Addressed This Visit    Type 2 diabetes mellitus with other specified complication ([place]) - Primary    Elevated A1c up to 6.4, overall still at goal but was previously 5.8 range No hypoglycemia. Complications - peripheral neuropathy mostly due to chemotherapy toxicity, other including hyperlipidemia, GERD, , obesity, hypothyroidism, OSA - increases risk of future cardiovascular complications  OFF Metformin not needed  Plan:  1. START Rybelsus 3mg  daily, dosing instructions reviewed, 30 day notify us for re order at higher dose 7mg , titration goal for weight loss benefits - declines injection GLP1 2. Encourage improved lifestyle - low carb, low sugar diet, reduce portion size, continue improving regular exercise 3. Check CBG if needed 4. Continue ACEi, - refused restart Statin - due to myopathy DM Foot      Relevant Medications   lisinopril (ZESTRIL) 5 MG tablet   RYBELSUS 3 MG TABS   Other Relevant Orders   POCT HgB A1C (Completed)   Osteoarthritis of knee   Relevant Medications   diclofenac Sodium (VOLTAREN) 1 % GEL   Myalgia due to statin    Secondary to statin Remains off therapy      Multiple sclerosis ([place])    Followed by [facility] Neurology Dr Tomi Likens Continue current therapy      Microalbuminuria     Relevant Medications   lisinopril (ZESTRIL) 5 MG tablet   Insomnia    Chronic insomnia Primary Controlled Re order Trazodone      Relevant Medications   traZODone (DESYREL) 100 MG tablet   Hyperlipidemia associated with type 2 diabetes mellitus ([place])    Previously controlled cholesterol on lifestyle with HyperTG Last lipid panel 03/2019  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 On Fish Oil omega 3 1,000mg  to TWICE a day for HyperTG 2. Continue ASA 81mg  for primary ASCVD risk reduction  3. Encourage improved lifestyle - low carb/cholesterol, reduce portion size, continue improving regular exercise      Relevant Medications   lisinopril (ZESTRIL) 5 MG tablet   RYBELSUS 3 MG TABS   Gastroesophageal reflux disease    Controlled On Famotidine, refill      Relevant Medications   famotidine (PEPCID) 20 MG tablet   Essential hypertension, benign    Well-controlled HTN - Home BP readings normal  Improved Creatinine  NSAID meloxicam OFF Carvedilol from prior visit 03/2018, not indicated if normal HR and no cardiac etiology.    Plan:  1. Continue current BP regimen - Lisinopril 5mg  daily - refill 2. Encourage improved lifestyle - low sodium diet, regular exercise 3. Continue monitor BP outside office, bring readings to next visit, if persistently >140/90 or new symptoms notify office sooner      Relevant Medications   lisinopril (ZESTRIL) 5 MG tablet      Meds ordered this encounter  Medications  . lisinopril (ZESTRIL) 5 MG tablet    Sig: Take 1 tablet (5 mg total) by mouth every evening.    Dispense:  90 tablet    Refill:  3  . famotidine (PEPCID) 20 MG tablet    Sig: Take 1 tablet (20 mg total) by mouth 2 (two) times daily before a meal.    Dispense:  180 tablet    Refill:  3  . traZODone (DESYREL) 100 MG tablet    Sig: Take 1 tablet (100 mg total) by mouth at bedtime.    Dispense:  90 tablet    Refill:  3  . diclofenac Sodium (VOLTAREN) 1 % GEL    Sig:  Apply 2 g topically 3 (three) times daily as needed.    Dispense:  100 g    Refill:  2  . RYBELSUS 3 MG TABS    Sig: Take 3 mg by mouth daily before breakfast. Take 30 min before first meal of day.    Dispense:  30 tablet    Refill:  0      Follow up plan: Return in about 6 months (around 10/20/2020) for Yearly Medicare Checkup.  Future labs ordered for 09/2020   Nobie Putnam, [facility] 04/19/2020, 8:10 AM

## 2020-04-19 NOTE — Assessment & Plan Note (Signed)
Controlled On Famotidine, refill

## 2020-04-19 NOTE — Assessment & Plan Note (Signed)
Secondary to statin Remains off therapy

## 2020-04-19 NOTE — Assessment & Plan Note (Addendum)
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 On Fish Oil omega 3 1,000mg  to TWICE a day for HyperTG 2. Continue ASA 81mg  for primary ASCVD risk reduction 3. Encourage improved lifestyle - low carb/cholesterol, reduce portion size, continue improving regular exercise

## 2020-04-19 NOTE — Assessment & Plan Note (Signed)
Well-controlled HTN - Home BP readings normal  Improved Creatinine  NSAID meloxicam OFF Carvedilol from prior visit 03/2018, not indicated if normal HR and no cardiac etiology.    Plan:  1. Continue current BP regimen - Lisinopril 5mg  daily - refill 2. Encourage improved lifestyle - low sodium diet, regular exercise 3. Continue monitor BP outside office, bring readings to next visit, if persistently >140/90 or new symptoms notify office sooner

## 2020-04-19 NOTE — Patient Instructions (Addendum)
Thank you for coming to the office today.  Rybelsus 3mg  daily before meds or meal 30 min, for 30 days, notify me if doing well, can increase to 7mg  - tell me how many pills and where to send.  Others on the list that are same med but injectable  Ozempic Trulicity Bydureon  -------------------  Recent Labs    10/16/19 0845 04/19/20 0811  HGBA1C 5.8* 6.4*    DUE for FASTING BLOOD WORK (no food or drink after midnight before the lab appointment, only water or coffee without cream/sugar on the morning of)  SCHEDULE "Lab Only" visit in the morning at the clinic for lab draw in 6 MONTHS   - Make sure Lab Only appointment is at about 1 week before your next appointment, so that results will be available  For Lab Results, once available within 2-3 days of blood draw, you can can log in to MyChart online to view your results and a brief explanation. Also, we can discuss results at next follow-up visit.    Please schedule a Follow-up Appointment to: Return in about 6 months (around 10/20/2020) for Yearly Medicare Checkup.  If you have any other questions or concerns, please feel free to call the office or send a message through [facility]. You may also schedule an earlier appointment if necessary.  Additionally, you may be receiving a survey about your experience at our office within a few days to 1 week by e-mail or mail. We value your feedback.  Nobie Putnam, DO [place]

## 2020-04-19 NOTE — Assessment & Plan Note (Signed)
Elevated A1c up to 6.4, overall still at goal but was previously 5.8 range No hypoglycemia. Complications - peripheral neuropathy mostly due to chemotherapy toxicity, other including hyperlipidemia, GERD, , obesity, hypothyroidism, OSA - increases risk of future cardiovascular complications  OFF Metformin not needed  Plan:  1. START Rybelsus 3mg  daily, dosing instructions reviewed, 30 day notify us for re order at higher dose 7mg , titration goal for weight loss benefits - declines injection GLP1 2. Encourage improved lifestyle - low carb, low sugar diet, reduce portion size, continue improving regular exercise 3. Check CBG if needed 4. Continue ACEi, - refused restart Statin - due to myopathy DM Foot

## 2020-04-19 NOTE — Assessment & Plan Note (Signed)
Followed by [facility] Neurology Dr Tomi Likens Continue current therapy

## 2020-04-19 NOTE — Assessment & Plan Note (Addendum)
Chronic insomnia Primary Controlled Re order Trazodone

## 2020-04-21 DIAGNOSIS — F5101 Primary insomnia: Secondary | ICD-10-CM

## 2020-04-23 ENCOUNTER — Other Ambulatory Visit: Payer: Self-pay | Admitting: Family Medicine

## 2020-04-23 DIAGNOSIS — F5101 Primary insomnia: Secondary | ICD-10-CM

## 2020-04-23 MED ORDER — VENLAFAXINE HCL ER 75 MG PO CP24: 75.0000 mg | ORAL_CAPSULE | Freq: Every day | ORAL | 0 refills | Status: DC

## 2020-04-23 NOTE — Telephone Encounter (Signed)
Copied from [place] [phone]. Topic: Quick Communication - Rx Refill/Question >> Apr 23, 2020  9:58 AM Rainey Pines A wrote: Medication: venlafaxine XR (EFFEXOR-XR) 75 MG 24 hr capsule (Patient is requesting prescription be sent over for 1 year due to being able to get medication cheaper that way. Patient is also completely out of medication and would like request expedited. )  Has the patient contacted their pharmacy?yes (Agent: If no, request that the patient contact the pharmacy for the refill.) (Agent: If yes, when and what did the pharmacy advise?)Contact PCP [facility] [id number], [place]  Phone:  [phone] Fax:  [phone]    Preferred Pharmacy (with phone number or street name):  Agent: Please be advised that RX refills may take up to 3 business days. We ask that you follow-up with your pharmacy.

## 2020-04-23 NOTE — Telephone Encounter (Signed)
Per Enis Gash protocol-can fill for 6 months -pt is requesting 1 years worth sent to requested pharmacy. Pt is out of med.

## 2020-05-14 LAB — HM DIABETES EYE EXAM

## 2020-06-19 ENCOUNTER — Telehealth: Payer: Self-pay | Admitting: *Deleted

## 2020-06-19 DIAGNOSIS — Z87891 Personal history of nicotine dependence: Secondary | ICD-10-CM

## 2020-06-19 DIAGNOSIS — Z122 Encounter for screening for malignant neoplasm of respiratory organs: Secondary | ICD-10-CM

## 2020-06-19 NOTE — Telephone Encounter (Signed)
Patient has been notified that annual lung cancer screening low dose CT scan is due currently or will be in near future. Confirmed that patient is within the age range of 55-77, and asymptomatic, (no signs or symptoms of lung cancer). Patient denies illness that would prevent curative treatment for lung cancer if found. Verified smoking history, (former, quit 2015, 30 pack year). The shared decision making visit was done 05/23/19. Patient is agreeable for CT scan being scheduled.

## 2020-07-02 ENCOUNTER — Ambulatory Visit
Admission: RE | Admit: 2020-07-02 | Discharge: 2020-07-02 | Disposition: A | Discharge status: Home / Self Care | Payer: Medicare Other | Source: Ambulatory Visit | Attending: Nurse Practitioner | Admitting: Nurse Practitioner

## 2020-07-02 ENCOUNTER — Other Ambulatory Visit: Payer: Self-pay

## 2020-07-02 DIAGNOSIS — Z87891 Personal history of nicotine dependence: Secondary | ICD-10-CM | POA: Diagnosis not present

## 2020-07-02 DIAGNOSIS — Z122 Encounter for screening for malignant neoplasm of respiratory organs: Secondary | ICD-10-CM | POA: Diagnosis not present

## 2020-07-08 ENCOUNTER — Encounter: Payer: Self-pay | Admitting: Dermatology

## 2020-07-08 ENCOUNTER — Encounter: Payer: Self-pay | Admitting: Family Medicine

## 2020-07-08 ENCOUNTER — Ambulatory Visit (INDEPENDENT_AMBULATORY_CARE_PROVIDER_SITE_OTHER): Payer: Medicare Other | Admitting: Dermatology

## 2020-07-08 ENCOUNTER — Other Ambulatory Visit: Payer: Self-pay

## 2020-07-08 ENCOUNTER — Encounter: Payer: Self-pay | Admitting: *Deleted

## 2020-07-08 DIAGNOSIS — L820 Inflamed seborrheic keratosis: Secondary | ICD-10-CM

## 2020-07-08 DIAGNOSIS — Z853 Personal history of malignant neoplasm of breast: Secondary | ICD-10-CM | POA: Diagnosis not present

## 2020-07-08 DIAGNOSIS — L578 Other skin changes due to chronic exposure to nonionizing radiation: Secondary | ICD-10-CM | POA: Diagnosis not present

## 2020-07-08 DIAGNOSIS — L821 Other seborrheic keratosis: Secondary | ICD-10-CM | POA: Diagnosis not present

## 2020-07-08 DIAGNOSIS — I700 Atherosclerosis of aorta: Secondary | ICD-10-CM | POA: Insufficient documentation

## 2020-07-08 NOTE — Progress Notes (Signed)
   New Patient Visit  Subjective  Cassidy Norris is a 69 y.o. female who presents for the following: New Patient (Initial Visit).  Patient presents today as a new patient to establish care with this practice. Has a few concerns: Right cheek, and left arm. Patient has no skin cancer hx, has hx of B/L Breast cancer 2015, with double mastectomy in 2015.  The following portions of the chart were reviewed this encounter and updated as appropriate:  Tobacco  Allergies  Meds  Problems  Med Hx  Surg Hx  Fam Hx     Review of Systems:  No other skin or systemic complaints except as noted in HPI or Assessment and Plan.  Objective  Well appearing patient in no apparent distress; mood and affect are within normal limits.  A focused examination was performed including Right Cheek, Left arm. Relevant physical exam findings are noted in the Assessment and Plan.  Objective  Left Antecubital, Right Face x 20 (20), Scalp: Erythematous keratotic or waxy stuck-on papule or plaque.    Assessment & Plan  Inflamed seborrheic keratosis (22) Left Antecubital; Scalp; Right Face x 20 (20)  Destruction of lesion - Left Antecubital, Right Face x 20, Scalp Complexity: simple   Destruction method: cryotherapy   Informed consent: discussed and consent obtained   Timeout:  patient name, date of birth, surgical site, and procedure verified Lesion destroyed using liquid nitrogen: Yes   Region frozen until ice ball extended beyond lesion: Yes   Outcome: patient tolerated procedure well with no complications   Post-procedure details: wound care instructions given     Actinic Damage - diffuse scaly erythematous macules with underlying dyspigmentation - Recommend daily broad spectrum sunscreen SPF 30+ to sun-exposed areas, reapply every 2 hours as needed.  - Call for new or changing lesions.  Seborrheic Keratoses - Stuck-on, waxy, tan-brown papules and plaques  - Discussed benign etiology and  prognosis. - Observe - Call for any changes  Return for as scheduled in Nov 2021 TBSE.  I, Donzetta Kohut, CMA, am acting as scribe for Sarina Ser, MD . Documentation: I have reviewed the above documentation for accuracy and completeness, and I agree with the above.  Sarina Ser, MD

## 2020-07-08 NOTE — Patient Instructions (Addendum)

## 2020-07-11 ENCOUNTER — Encounter: Payer: Self-pay | Admitting: Dermatology

## 2020-07-12 ENCOUNTER — Telehealth: Payer: Self-pay | Admitting: Neurology

## 2020-07-12 NOTE — Telephone Encounter (Signed)
Pt advised of Authorization of her MRI and no labs needed per DR. Jaffe last note.

## 2020-07-12 NOTE — Telephone Encounter (Signed)
Patient has MRI scheduled for November but wanted to verify it's been approved. She also wanted to know if she needed to have labs done prior to her follow up appointment in February. Please call.

## 2020-08-02 ENCOUNTER — Telehealth: Payer: Self-pay | Admitting: *Deleted

## 2020-08-02 NOTE — Telephone Encounter (Signed)
Patient called to stated that she would not attend her Monday appointment because it was a video visit and she did not have a device to do a video call. Writer reviewed appointments and left message for patient that her appointment was at the [facility] not a video call.

## 2020-08-02 NOTE — Telephone Encounter (Signed)
Thank you :)

## 2020-08-03 NOTE — Progress Notes (Signed)
  [place]  [phone]  [phone]     Cassidy Norris DOB: 05-25-1951  MR#: [id number]  YWV#:[id number]  Patient Care Team: [name], DO as PCP - General (Family Medicine) Christene Lye, MD (General Surgery) Forest Gleason, MD (Inactive) (Oncology) Pieter Partridge, DO as Consulting Physician (Neurology)    Lloyd Huger, MD   08/06/2020 9:22 AM      This encounter was created in error - please disregard.

## 2020-08-05 ENCOUNTER — Inpatient Hospital Stay: Payer: Medicare Other | Admitting: Oncology

## 2020-08-05 NOTE — Telephone Encounter (Signed)
Pt called to state she would not be returning to [facility] per conversation with Ellison Hughs. I have noted in the chart that pt doesn't wish to be followed any more as she is 5 years out and was supposed to be released from clinic this year anyway.

## 2020-08-26 DIAGNOSIS — M1711 Unilateral primary osteoarthritis, right knee: Secondary | ICD-10-CM | POA: Diagnosis not present

## 2020-10-01 ENCOUNTER — Other Ambulatory Visit: Payer: Self-pay

## 2020-10-01 ENCOUNTER — Ambulatory Visit
Admission: RE | Admit: 2020-10-01 | Discharge: 2020-10-01 | Disposition: A | Discharge status: Home / Self Care | Payer: Medicare Other | Source: Ambulatory Visit | Attending: Neurology | Admitting: Neurology

## 2020-10-01 DIAGNOSIS — G9389 Other specified disorders of brain: Secondary | ICD-10-CM | POA: Diagnosis not present

## 2020-10-01 DIAGNOSIS — G35 Multiple sclerosis: Secondary | ICD-10-CM | POA: Diagnosis not present

## 2020-10-01 MED ORDER — GADOBENATE DIMEGLUMINE 529 MG/ML IV SOLN
19.0000 mL | Freq: Once | INTRAVENOUS | Status: AC | PRN
  Administered 2020-10-01: 19 mL via INTRAVENOUS

## 2020-10-10 ENCOUNTER — Other Ambulatory Visit: Payer: Self-pay

## 2020-10-10 ENCOUNTER — Ambulatory Visit (INDEPENDENT_AMBULATORY_CARE_PROVIDER_SITE_OTHER): Payer: Medicare Other | Admitting: Dermatology

## 2020-10-10 DIAGNOSIS — Z1283 Encounter for screening for malignant neoplasm of skin: Secondary | ICD-10-CM

## 2020-10-10 DIAGNOSIS — L578 Other skin changes due to chronic exposure to nonionizing radiation: Secondary | ICD-10-CM

## 2020-10-10 DIAGNOSIS — D229 Melanocytic nevi, unspecified: Secondary | ICD-10-CM

## 2020-10-10 DIAGNOSIS — D1800 Hemangioma unspecified site: Secondary | ICD-10-CM

## 2020-10-10 DIAGNOSIS — L820 Inflamed seborrheic keratosis: Secondary | ICD-10-CM | POA: Diagnosis not present

## 2020-10-10 DIAGNOSIS — L821 Other seborrheic keratosis: Secondary | ICD-10-CM

## 2020-10-10 DIAGNOSIS — L814 Other melanin hyperpigmentation: Secondary | ICD-10-CM

## 2020-10-10 DIAGNOSIS — Z853 Personal history of malignant neoplasm of breast: Secondary | ICD-10-CM | POA: Diagnosis not present

## 2020-10-10 NOTE — Patient Instructions (Signed)

## 2020-10-10 NOTE — Progress Notes (Signed)
   Follow-Up Visit   Subjective  Cassidy Norris is a 69 y.o. female who presents for the following: Follow-up (ISK follow up of right face treated with LN2 - some residual) and Annual Exam (No histroy of skin cancer or abnormal moles - TBSE today). The patient presents for Total-Body Skin Exam (TBSE) for skin cancer screening and mole check.  The following portions of the chart were reviewed this encounter and updated as appropriate:  Tobacco  Allergies  Meds  Problems  Med Hx  Surg Hx  Fam Hx     Review of Systems:  No other skin or systemic complaints except as noted in HPI or Assessment and Plan.  Objective  Well appearing patient in no apparent distress; mood and affect are within normal limits.  A full examination was performed including scalp, head, eyes, ears, nose, lips, neck, chest, axillae, abdomen, back, buttocks, bilateral upper extremities, bilateral lower extremities, hands, feet, fingers, toes, fingernails, and toenails. All findings within normal limits unless otherwise noted below.  Objective  Right upper eyelid x 1, face/neck  x 30 (30): Erythematous keratotic or waxy stuck-on papule or plaque.   Objective  Chest - Medial [facility]): Well healed mastectomy scars. No lymphadenopathy.   Assessment & Plan    Lentigines - Scattered tan macules - Discussed due to sun exposure - Benign, observe - Call for any changes  Seborrheic Keratoses - Stuck-on, waxy, tan-brown papules and plaques  - Discussed benign etiology and prognosis. - Observe - Call for any changes  Melanocytic Nevi - Tan-brown and/or pink-flesh-colored symmetric macules and papules - Benign appearing on exam today - Observation - Call clinic for new or changing moles - Recommend daily use of broad spectrum spf 30+ sunscreen to sun-exposed areas.   Hemangiomas - Red papules - Discussed benign nature - Observe - Call for any changes  Actinic Damage - Chronic, secondary to cumulative  UV/sun exposure - diffuse scaly erythematous macules with underlying dyspigmentation - Recommend daily broad spectrum sunscreen SPF 30+ to sun-exposed areas, reapply every 2 hours as needed.  - Call for new or changing lesions.  Skin cancer screening performed today.  Inflamed seborrheic keratosis Right upper eyelid x 1, face/neck  x 30  Destruction of lesion - Right upper eyelid x 1, face/neck  x 30 Complexity: simple   Destruction method: cryotherapy   Informed consent: discussed and consent obtained   Timeout:  patient name, date of birth, surgical site, and procedure verified Lesion destroyed using liquid nitrogen: Yes   Region frozen until ice ball extended beyond lesion: Yes   Outcome: patient tolerated procedure well with no complications   Post-procedure details: wound care instructions given    History of breast cancer Chest - Medial (Center) Clear today. No lymphadenopathy.  Return in about 4 months (around 02/07/2021).   I, Ashok Cordia, CMA, am acting as scribe for Sarina Ser, MD .  Documentation: I have reviewed the above documentation for accuracy and completeness, and I agree with the above.  Sarina Ser, MD

## 2020-10-11 ENCOUNTER — Encounter: Payer: Self-pay | Admitting: Dermatology

## 2020-10-14 ENCOUNTER — Other Ambulatory Visit: Payer: Self-pay | Admitting: *Deleted

## 2020-10-14 DIAGNOSIS — I10 Essential (primary) hypertension: Secondary | ICD-10-CM

## 2020-10-14 DIAGNOSIS — E785 Hyperlipidemia, unspecified: Secondary | ICD-10-CM

## 2020-10-14 DIAGNOSIS — G35 Multiple sclerosis: Secondary | ICD-10-CM

## 2020-10-14 DIAGNOSIS — Z8639 Personal history of other endocrine, nutritional and metabolic disease: Secondary | ICD-10-CM

## 2020-10-14 DIAGNOSIS — E559 Vitamin D deficiency, unspecified: Secondary | ICD-10-CM

## 2020-10-14 DIAGNOSIS — E1169 Type 2 diabetes mellitus with other specified complication: Secondary | ICD-10-CM

## 2020-10-15 ENCOUNTER — Other Ambulatory Visit: Payer: Medicare Other

## 2020-10-15 ENCOUNTER — Other Ambulatory Visit: Payer: Self-pay

## 2020-10-15 DIAGNOSIS — I10 Essential (primary) hypertension: Secondary | ICD-10-CM | POA: Diagnosis not present

## 2020-10-15 DIAGNOSIS — E559 Vitamin D deficiency, unspecified: Secondary | ICD-10-CM | POA: Diagnosis not present

## 2020-10-15 DIAGNOSIS — Z8639 Personal history of other endocrine, nutritional and metabolic disease: Secondary | ICD-10-CM | POA: Diagnosis not present

## 2020-10-15 DIAGNOSIS — E785 Hyperlipidemia, unspecified: Secondary | ICD-10-CM | POA: Diagnosis not present

## 2020-10-15 DIAGNOSIS — G35 Multiple sclerosis: Secondary | ICD-10-CM | POA: Diagnosis not present

## 2020-10-15 DIAGNOSIS — E1169 Type 2 diabetes mellitus with other specified complication: Secondary | ICD-10-CM | POA: Diagnosis not present

## 2020-10-16 LAB — COMPLETE METABOLIC PANEL WITH GFR
AG Ratio: 1.6 (calc) (ref 1.0–2.5)
ALT: 38 U/L — ABNORMAL HIGH (ref 6–29)
AST: 30 U/L (ref 10–35)
Albumin: 4.5 g/dL (ref 3.6–5.1)
Alkaline phosphatase (APISO): 67 U/L (ref 37–153)
BUN: 18 mg/dL (ref 7–25)
CO2: 29 mmol/L (ref 20–32)
Calcium: 10.4 mg/dL (ref 8.6–10.4)
Chloride: 104 mmol/L (ref 98–110)
Creat: 0.94 mg/dL (ref 0.50–0.99)
GFR, Est African American: 72 mL/min/{1.73_m2} (ref >=60)
GFR, Est Non African American: 62 mL/min/{1.73_m2} (ref >=60)
Globulin: 2.8 g/dL (calc) (ref 1.9–3.7)
Glucose, Bld: 127 mg/dL — ABNORMAL HIGH (ref 65–99)
Potassium: 4.3 mmol/L (ref 3.5–5.3)
Sodium: 142 mmol/L (ref 135–146)
Total Bilirubin: 0.6 mg/dL (ref 0.2–1.2)
Total Protein: 7.3 g/dL (ref 6.1–8.1)

## 2020-10-16 LAB — HEMOGLOBIN A1C
Hgb A1c MFr Bld: 6.6 % of total Hgb — ABNORMAL HIGH (ref <5.7)
Mean Plasma Glucose: 143 (calc)
eAG (mmol/L): 7.9 (calc)

## 2020-10-16 LAB — TSH: TSH: 1.26 mIU/L (ref 0.40–4.50)

## 2020-10-16 LAB — LIPID PANEL
Cholesterol: 213 mg/dL — ABNORMAL HIGH (ref <200)
HDL: 42 mg/dL — ABNORMAL LOW (ref >=50)
LDL Cholesterol (Calc): 136 mg/dL (calc) — ABNORMAL HIGH
Non-HDL Cholesterol (Calc): 171 mg/dL (calc) — ABNORMAL HIGH (ref <130)
Total CHOL/HDL Ratio: 5.1 (calc) — ABNORMAL HIGH (ref <5.0)
Triglycerides: 201 mg/dL — ABNORMAL HIGH (ref <150)

## 2020-10-16 LAB — CBC WITH DIFFERENTIAL/PLATELET
Absolute Monocytes: 679 cells/uL (ref 200–950)
Basophils Absolute: 71 cells/uL (ref 0–200)
Basophils Relative: 0.9 %
Eosinophils Absolute: 253 cells/uL (ref 15–500)
Eosinophils Relative: 3.2 %
HCT: 42.4 % (ref 35.0–45.0)
Hemoglobin: 14.2 g/dL (ref 11.7–15.5)
Lymphs Abs: 2275 cells/uL (ref 850–3900)
MCH: 32 pg (ref 27.0–33.0)
MCHC: 33.5 g/dL (ref 32.0–36.0)
MCV: 95.5 fL (ref 80.0–100.0)
MPV: 10.7 fL (ref 7.5–12.5)
Monocytes Relative: 8.6 %
Neutro Abs: 4622 cells/uL (ref 1500–7800)
Neutrophils Relative %: 58.5 %
Platelets: 224 10*3/uL (ref 140–400)
RBC: 4.44 10*6/uL (ref 3.80–5.10)
RDW: 11.8 % (ref 11.0–15.0)
Total Lymphocyte: 28.8 %
WBC: 7.9 10*3/uL (ref 3.8–10.8)

## 2020-10-16 LAB — VITAMIN B12: Vitamin B-12: >2000 pg/mL — ABNORMAL HIGH (ref 200–1100)

## 2020-10-16 LAB — VITAMIN D 25 HYDROXY (VIT D DEFICIENCY, FRACTURES): Vit D, 25-Hydroxy: 63 ng/mL (ref 30–100)

## 2020-10-21 ENCOUNTER — Other Ambulatory Visit: Payer: Self-pay

## 2020-10-21 ENCOUNTER — Ambulatory Visit (INDEPENDENT_AMBULATORY_CARE_PROVIDER_SITE_OTHER): Payer: Medicare Other | Admitting: Family Medicine

## 2020-10-21 ENCOUNTER — Encounter: Payer: Self-pay | Admitting: Family Medicine

## 2020-10-21 DIAGNOSIS — I700 Atherosclerosis of aorta: Secondary | ICD-10-CM | POA: Diagnosis not present

## 2020-10-21 DIAGNOSIS — E1169 Type 2 diabetes mellitus with other specified complication: Secondary | ICD-10-CM | POA: Diagnosis not present

## 2020-10-21 DIAGNOSIS — R7989 Other specified abnormal findings of blood chemistry: Secondary | ICD-10-CM | POA: Diagnosis not present

## 2020-10-21 DIAGNOSIS — E785 Hyperlipidemia, unspecified: Secondary | ICD-10-CM | POA: Diagnosis not present

## 2020-10-21 DIAGNOSIS — Z853 Personal history of malignant neoplasm of breast: Secondary | ICD-10-CM

## 2020-10-21 DIAGNOSIS — G720 Drug-induced myopathy: Secondary | ICD-10-CM

## 2020-10-21 NOTE — Assessment & Plan Note (Signed)
Improved

## 2020-10-21 NOTE — Assessment & Plan Note (Signed)
Followed by Gen Surgery Now > 5 years out, off hormone therapy, on yearly surveillance PRN

## 2020-10-21 NOTE — Progress Notes (Addendum)
Subjective:    Patient ID: Cassidy Norris, female    DOB: 08/15/1951, 69 y.o.   MRN: [id number]  Cassidy Norris is a 69 y.o. female presenting on 10/21/2020 for Diabetes  Virtual / Telehealth Encounter - Telephone The purpose of this virtual visit is to provide medical care while limiting exposure to the novel coronavirus (COVID19) for both patient and office staff.  Consent was obtained for remote visit:  Yes.   Answered questions that patient had about telehealth interaction:  Yes.   I discussed the limitations, risks, security and privacy concerns of performing an evaluation and management service by video/telephone. I also discussed with the patient that there may be a patient responsible charge related to this service. The patient expressed understanding and agreed to proceed.  Patient Location: Home Provider Location: [facility] (Office)  Participants in virtual visit: - Patient: Cassidy Norris - CMA: Frederich Cha, [place] - Provider: Dr [name]   HPI  CHRONIC DM, Type 2/ Chemotherapy toxic neuropathy A1c 6.6, on last lab. Updated since coming off metformin 03/2019 Due for A1c today, has had elevated CBGs Meds:None (off metformin) Reports good compliance. Tolerating well w/o side-effects Currently on ACEi Lifestyle: - Diet (balanced diet, improved carbs and sugars- goal to keep improving) - Exercise (active, improving) L>R bilateral foot neuropathy secondary to chemotherapy Denies hypoglycemia, polyuria, visual changes, numbness or tingling.  CHRONIC HTN/ CKD-III Prior elevated creatinine. Lab has improved 0.9 to 0.94 She checks BP regularly Current Meds -Lisinopril 19m OFF Carvedilol Reports good compliance, took meds today. Tolerating well, w/o complaints. Denies CP, dyspnea, HA, edema, dizziness / lightheadedness  [facility] Likens Hyperlipidemia Secondary to T2DM, last Lipid 09/2020,  elevated TG >200 and LDL 136 Not on statin therapy now due to myopathy Reduced her Fish Oil recently  Osteoarthritis Bilateral Knee / Chronic Knee Pain S/p Left Knee Repair - No longer taking meloxicam Still has chronic pain in knee.    Health Maintenance: Colonoscopy done 05/2015, negative, next due 5 years, she has done genetic testing.  Declines all vaccines, COVID, PNA, Flu.  [facility] 10/21/2020 04/19/2020 05/09/2019  Decreased Interest 0 0 0  Down, Depressed, Hopeless 0 0 0  PHQ - 2 Score 0 0 0  Altered sleeping - - -  Tired, decreased energy - - -  Change in appetite - - -  Feeling bad or failure about yourself  - - -  Trouble concentrating - - -  Moving slowly or fidgety/restless - - -  Suicidal thoughts - - -  PHQ-9 Score - - -  Some recent data might be hidden    Past Medical History:  Diagnosis Date  . Anemia   . Arthritis not really diagnosis but have pain  . B12 deficiency 07/23/2016   Will check levels to determine if injections are needed again. Await results. Recent CBC at Onc WNL  . Blood transfusion without reported diagnosis 1983 had a miscarriage/dnc  . [place] 12[place]   Multifocal disease: pT1c,m, N0(isolated tumor cells) Er/ PR positive, Her 2 negative.  . Cancer ([facility] 01/09/2014   Invasive lobular carcinoma, 2.2 cm, T2,N1 ER/PR positive, HER-2/neu negative  . Cataract 2014?  .Marland KitchenDiffuse cystic mastopathy   . Essential hypertension, benign 09/24/2017  . Family history of malignant neoplasm of gastrointestinal tract 2012  . Fractured elbow 08/28/14  . Gastroesophageal reflux disease   . Hx of metabolic acidosis with increased anion gap   .  Increased heart rate   . Multiple sclerosis ([place])   . Neuromuscular disorder ([place])    neuropathy in bil feet - left is worse.  . Obesity, unspecified   . Peripheral neuropathy   . Personal history of tobacco use, presenting hazards to health   . Restless leg syndrome   . Sleep apnea     uses CPAP  . Special screening for malignant neoplasms, colon    Past Surgical History:  Procedure Laterality Date  . BREAST BIOPSY Right 1973,2001  . BREAST BIOPSY Right 11-07-13   INVASIVE MAMMARY CARCINOMA , ER/PR positive Her2 negative  . BREAST BIOPSY Left 11-27-13   invasive lobular and DCIS  . BREAST SURGERY Bilateral 01/09/14   mastectomy  . CHOLECYSTECTOMY    . COLONOSCOPY  2010   Dr. Jamal Collin  . COLONOSCOPY WITH PROPOFOL N/A 06/11/2015   Procedure: COLONOSCOPY WITH PROPOFOL;  Surgeon: Christene Lye, MD;  Location: [facility] ENDOSCOPY;  Service: Endoscopy;  Laterality: N/A;  . DILATATION & CURETTAGE/HYSTEROSCOPY WITH MYOSURE N/A 07/12/2018   Procedure: DILATATION & CURETTAGE/HYSTEROSCOPY WITH MYOSURE WITH ENDOMETRIAL POLYPECTOMY;  Surgeon: Will Bonnet, MD;  Location: [facility] ORS;  Service: Gynecology;  Laterality: N/A;  . DILATION AND CURETTAGE OF UTERUS  1983  . JOINT REPLACEMENT  11/23/2015  . POLYPECTOMY  1989  . PORTA CATH REMOVAL    . PORTACATH PLACEMENT  2015  . SPINE SURGERY  09/14/14   Ruptured disk L4- L5  . TOTAL KNEE ARTHROPLASTY Left 11/22/2015   Procedure: TOTAL KNEE ARTHROPLASTY;  Surgeon: Earlie Server, MD;  Location: [facility];  Service: Orthopedics;  Laterality: Left;  . TUBAL LIGATION    . WISDOM TOOTH EXTRACTION  2006   Social History   Socioeconomic History  . Marital status: Widowed    Spouse name: Not on file  . Number of children: Not on file  . Years of education: some college  . Highest education level: High school graduate  Occupational History  . Occupation: retired  Tobacco Use  . Smoking status: Former Smoker    Packs/day: 1.00    Years: 30.00    Pack years: 30.00    Types: Cigarettes    Quit date: 12/11/2013    Years since quitting: 6.8  . Smokeless tobacco: Former Network engineer  . Vaping Use: Never used  Substance and Sexual Activity  . Alcohol use: No    Alcohol/week: 0.0 standard drinks    Comment: social  . Drug use: No   . Sexual activity: Never  Other Topics Concern  . Not on file  Social History Narrative  . Not on file   Social Determinants of Health   Financial Resource Strain:   . Difficulty of Paying Living Expenses: Not on file  Food Insecurity:   . Worried About Charity fundraiser in the Last Year: Not on file  . Ran Out of Food in the Last Year: Not on file  Transportation Needs:   . Lack of Transportation (Medical): Not on file  . Lack of Transportation (Non-Medical): Not on file  Physical Activity:   . Days of Exercise per Week: Not on file  . Minutes of Exercise per Session: Not on file  Stress:   . Feeling of Stress : Not on file  Social Connections:   . Frequency of Communication with Friends and Family: Not on file  . Frequency of Social Gatherings with Friends and Family: Not on file  . Attends Religious Services: Not on file  .  Active Member of Clubs or Organizations: Not on file  . Attends Archivist Meetings: Not on file  . Marital Status: Not on file  Intimate Partner Violence:   . Fear of Current or Ex-Partner: Not on file  . Emotionally Abused: Not on file  . Physically Abused: Not on file  . Sexually Abused: Not on file   Family History  Problem Relation Age of Onset  . Cancer Mother        lung  . COPD Mother   . Cancer Father        colon, stomach  . Diabetes Maternal Grandmother   . Hypertension Sister   . Rectal cancer Paternal Uncle   . Rectal cancer Paternal Uncle    Current Outpatient Medications on File Prior to Visit  Medication Sig  . acetaminophen (TYLENOL) 500 MG tablet Take 1,000 mg by mouth 2 (two) times daily as needed for moderate pain or headache.  . baclofen (LIORESAL) 10 MG tablet Take 1 tablet (10 mg total) by mouth 3 (three) times daily as needed for muscle spasms.  . calcium carbonate (CALCIUM 600) 1500 (600 Ca) MG TABS tablet Take by mouth 2 (two) times daily with a meal.  . diclofenac Sodium (VOLTAREN) 1 % GEL Apply 2 g  topically 3 (three) times daily as needed.  . famotidine (PEPCID) 20 MG tablet Take 1 tablet (20 mg total) by mouth 2 (two) times daily before a meal.  . fluticasone (FLONASE) 50 MCG/ACT nasal spray PLACE 2 SPRAYS INTO BOTH NOSTRILS AT BEDTIME.  Marland Kitchen lisinopril (ZESTRIL) 5 MG tablet Take 1 tablet (5 mg total) by mouth every evening.  . Omega-3 Fatty Acids (FISH OIL) 1000 MG CAPS Take 1,000 mg by mouth 3 (three) times daily.   . polyethylene glycol powder (GLYCOLAX/MIRALAX) powder Take 17 g by mouth 2 (two) times daily as needed for moderate constipation.  . traZODone (DESYREL) 100 MG tablet Take 1 tablet (100 mg total) by mouth at bedtime.  Marland Kitchen venlafaxine XR (EFFEXOR-XR) 75 MG 24 hr capsule Take 1 capsule (75 mg total) by mouth daily with breakfast.  . vitamin B-12 (CYANOCOBALAMIN) 1000 MCG tablet Take 2,500 mcg by mouth daily.  . Vitamin D, Cholecalciferol, 50 MCG (2000 UT) CAPS Take 1 capsule by mouth every 3 (three) days.   No current facility-administered medications on file prior to visit.    Review of Systems Per HPI unless specifically indicated above      Objective:    LMP 12/22/1999 (Approximate)   Wt Readings from Last 3 Encounters:  07/02/20 200 lb (90.7 kg)  04/19/20 206 lb 9.6 oz (93.7 kg)  01/04/20 201 lb (91.2 kg)    Physical Exam   Telephone visit, no physical exam.  Results for orders placed or performed in visit on 10/14/20  VITAMIN D 25 Hydroxy (Vit-D Deficiency, Fractures)  Result Value Ref Range   Vit D, 25-Hydroxy 63 30 - 100 ng/mL  Vitamin B12  Result Value Ref Range   Vitamin B-12 >2,000 (H) 200 - 1,100 pg/mL  TSH  Result Value Ref Range   TSH 1.26 0.40 - 4.50 mIU/L  Lipid panel  Result Value Ref Range   Cholesterol 213 (H) <200 mg/dL   HDL 42 (L) > OR = 50 mg/dL   Triglycerides 201 (H) <150 mg/dL   LDL Cholesterol (Calc) 136 (H) mg/dL (calc)   Total CHOL/HDL Ratio 5.1 (H) <5.0 (calc)   Non-HDL Cholesterol (Calc) 171 (H) <130 mg/dL (calc)  COMPLETE  METABOLIC PANEL WITH GFR  Result Value Ref Range   Glucose, Bld 127 (H) 65 - 99 mg/dL   BUN 18 7 - 25 mg/dL   Creat 0.94 0.50 - 0.99 mg/dL   GFR, Est Non African American 62 > OR = 60 mL/min/1.73m   GFR, Est African American 72 > OR = 60 mL/min/1.751m  BUN/Creatinine Ratio NOT APPLICABLE 6 - 22 (calc)   Sodium 142 135 - 146 mmol/L   Potassium 4.3 3.5 - 5.3 mmol/L   Chloride 104 98 - 110 mmol/L   CO2 29 20 - 32 mmol/L   Calcium 10.4 8.6 - 10.4 mg/dL   Total Protein 7.3 6.1 - 8.1 g/dL   Albumin 4.5 3.6 - 5.1 g/dL   Globulin 2.8 1.9 - 3.7 g/dL (calc)   AG Ratio 1.6 1.0 - 2.5 (calc)   Total Bilirubin 0.6 0.2 - 1.2 mg/dL   Alkaline phosphatase (APISO) 67 37 - 153 U/L   AST 30 10 - 35 U/L   ALT 38 (H) 6 - 29 U/L  CBC with Differential/Platelet  Result Value Ref Range   WBC 7.9 3.8 - 10.8 Thousand/uL   RBC 4.44 3.80 - 5.10 Million/uL   Hemoglobin 14.2 11.7 - 15.5 g/dL   HCT 42.4 35 - 45 %   MCV 95.5 80.0 - 100.0 fL   MCH 32.0 27.0 - 33.0 pg   MCHC 33.5 32.0 - 36.0 g/dL   RDW 11.8 11.0 - 15.0 %   Platelets 224 140 - 400 Thousand/uL   MPV 10.7 7.5 - 12.5 fL   Neutro Abs 4,622 1,500 - 7,800 cells/uL   Lymphs Abs 2,275 850 - 3,900 cells/uL   Absolute Monocytes 679 200 - 950 cells/uL   Eosinophils Absolute 253 15.0 - 500.0 cells/uL   Basophils Absolute 71 0.0 - 200.0 cells/uL   Neutrophils Relative % 58.5 %   Total Lymphocyte 28.8 %   Monocytes Relative 8.6 %   Eosinophils Relative 3.2 %   Basophils Relative 0.9 %  Hemoglobin A1c  Result Value Ref Range   Hgb A1c MFr Bld 6.6 (H) <5.7 % of total Hgb   Mean Plasma Glucose 143 (calc)   eAG (mmol/L) 7.9 (calc)      Assessment & Plan:   Problem List Items Addressed This Visit    Type 2 diabetes mellitus with other specified complication (HCC) - Primary    Elevated A1c up to 6.6, overall still at goal but was previously 5.8 range No hypoglycemia. Complications - peripheral neuropathy mostly due to chemotherapy toxicity, other  including hyperlipidemia, GERD, , obesity, hypothyroidism, OSA - increases risk of future cardiovascular complications  OFF Metformin not needed Did not take Rybelsus  Plan:  1. Reconsider Metformin - for now improve lifestyle 2. Encourage improved lifestyle - low carb, low sugar diet, reduce portion size, continue improving regular exercise 3. Check CBG if needed 4. Continue ACEi, - refused restart Statin - due to myopathy      Hyperlipidemia associated with type 2 diabetes mellitus ([facility]   Previously controlled cholesterol on lifestyle with HyperTG, now elevated LDL again Last lipid panel 09/2020  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 May increase Fish Oil again, reconsider Statin vs Zetia in future 2. Continue ASA 8120mor primary ASCVD risk reduction 3. Encourage improved lifestyle - low carb/cholesterol, reduce portion size, continue improving regular exercise      History of bilateral breast cancer    Followed by Gen Surgery Now >  5 years out, off hormone therapy, on yearly surveillance PRN      Elevated LFTs    Improved.      Drug-induced myopathy    Secondary to statin Remains off therapy - reconsider alternative statin vs zetia next time      Aortic atherosclerosis (HCC)    Known vascular disease, on imaging No longer on statin therapy, may restart when ready         No orders of the defined types were placed in this encounter.     Follow up plan: Return in about 6 months (around 04/20/2021) for 6 month follow-up DM A1c.   Patient verbalizes understanding with the above medical recommendations including the limitation of remote medical advice.  Specific follow-up and call-back criteria were given for patient to follow-up or seek medical care more urgently if needed.  Total duration of direct patient care provided via telephone: 15 minutes   Nobie Putnam, [facility] 10/21/2020, 8:26  AM

## 2020-10-21 NOTE — Patient Instructions (Addendum)
° °  Please schedule a Follow-up Appointment to: Return in about 6 months (around 04/20/2021) for 6 month follow-up DM A1c.  If you have any other questions or concerns, please feel free to call the office or send a message through [place]. You may also schedule an earlier appointment if necessary.  Additionally, you may be receiving a survey about your experience at our office within a few days to 1 week by e-mail or mail. We value your feedback.  Nobie Putnam, DO [place]

## 2020-10-21 NOTE — Assessment & Plan Note (Signed)
Previously controlled cholesterol on lifestyle with HyperTG, now elevated LDL again Last lipid panel 09/2020  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 May increase Fish Oil again, reconsider Statin vs Zetia in future 2. Continue ASA 81mg  for primary ASCVD risk reduction 3. Encourage improved lifestyle - low carb/cholesterol, reduce portion size, continue improving regular exercise

## 2020-10-21 NOTE — Assessment & Plan Note (Addendum)
Elevated A1c up to 6.6, overall still at goal but was previously 5.8 range No hypoglycemia. Complications - peripheral neuropathy mostly due to chemotherapy toxicity, other including hyperlipidemia, GERD, , obesity, hypothyroidism, OSA - increases risk of future cardiovascular complications  OFF Metformin not needed Did not take Rybelsus  Plan:  1. Reconsider Metformin - for now improve lifestyle 2. Encourage improved lifestyle - low carb, low sugar diet, reduce portion size, continue improving regular exercise 3. Check CBG if needed 4. Continue ACEi, - refused restart Statin - due to myopathy

## 2020-10-21 NOTE — Assessment & Plan Note (Signed)
Secondary to statin Remains off therapy - reconsider alternative statin vs zetia next time 

## 2020-10-21 NOTE — Assessment & Plan Note (Signed)
Known vascular disease, on imaging No longer on statin therapy, may restart when ready

## 2020-10-29 ENCOUNTER — Ambulatory Visit: Payer: Medicare Other | Admitting: Surgery

## 2020-11-25 DIAGNOSIS — J301 Allergic rhinitis due to pollen: Secondary | ICD-10-CM | POA: Diagnosis not present

## 2020-11-25 DIAGNOSIS — G4733 Obstructive sleep apnea (adult) (pediatric): Secondary | ICD-10-CM | POA: Diagnosis not present

## 2020-11-25 DIAGNOSIS — H8112 Benign paroxysmal vertigo, left ear: Secondary | ICD-10-CM | POA: Diagnosis not present

## 2020-12-31 NOTE — Progress Notes (Signed)
NEUROLOGY FOLLOW UP OFFICE NOTE  Cassidy Norris [id number]   Subjective:  Cassidy Norris is a 70year old right-handed woman with multiple sclerosis, type 2 diabetes, former smoker and history of bilateral breast cancer 2015 s/p bilateral mastectomy and s/p L4-5 laminectomy who follows up for multiple sclerosis  UPDATE: Current disease modifying therapy: none Other medications:baclofen 7m twice daily; D3 2000 IU three times a week  MRI BRAIN W WO on 10/01/2020 personally reviewed and stable compared to imaging from 08/10/2019 Vit D level from November was 623  Vision:No change Motor: No issues Sensory: Neuropathy in feet, stable Pain:Still having muscle cramps and neuropathic pain in legs at night. Often wakes up in middle of the night with leg pain/cramps.  She is getting a lot of pain in the side of her right leg from the knee down to the ankle.  It feels like a cramp-like pain.  Not sharp or burning.  She does need to have a right knee replacement.   Gait: okay Bowel/Bladder: no Fatigue: No. Still having trouble sleeping despite trazodone.  Cognition: Stable Mood: Okay Directional vertigo to the left.  HISTORY: In November 2001, she tripped and fell, hitting her head.She went to the ED where a CT of the head was reportedly unremarkable.She was advised to have an MRI of the brain, which was performed on 12/12/00.It revealed small round and elliptical white matter lesions less than 1 cm in the periventricular region.In December of 2002, she followed up with a neurologist, Dr. PCorinna CapraVEP performed in 2003 showed prolonged p100 latency in the right optic nerve.BAER was normal.She reportedly had a lumbar puncture where CSF results were supportive of MS.She was given 5 days of Solu-Medrol followed by 3 weeks of oral prednisone.She was advised to start an interferon, but due to possible side effects, she decided to hold off and see if she  clinically progresses.She did have a follow up MRI of the brain in December 2004, which reportedly showed an enhancing lesion as well as a remote lesion in the cerebellum in addition to the periventricular white matter lesions.She never had any noticeable clinical flare-ups and had done well off of a disease-modifying agent.Over the pastfewyears, she notes possible worsening in depth perception.Sometimes she notes difficulty grabbing for objects or will drive too close to the curb.  03/12/2015 MRI BRAIN & CERVICAL SPINE W WO: non-enhancing bilateral periventricular white matter lesions oriented perpendicular to the ventricles and corpus callosum, as well as involving temporal lobe white matter and mild involvement of the left side of cerebellum.No cervical cord lesions.  01/28/2017 MRI BRAIN W WO:  "two small new or increased right hemisphere subcortical white matter lesions when compared to 2016"with no enhancement or active demyelination. Otherwise, MRI is stable.  01/25/2018 MRI BRAIN W WO:  stable chronic white matter hyperintensities consistent with MS.  08/10/2019 MRI BRAIN W WO:  Multiple T2/FLAIR hyperintense foci without enhancement, stable compared to imaging from 03/27/2018.  In 2015, she was diagnosed with bilateral breast cancer.She underwent bilateral mastectomy and chemotherapy, which ended in September.As a result of the chemotherapy, she has neuropathy in the feet.She also had problems with her left leg and was found to have a ruptured disc at L4-5 and underwent surgery in October 2015.She has baseline numbness over the dorsum of left foot and lateral leg. The toes in her left foot sometimes curl. Since chemotherapy, she notes some mild memory problems.She sometimes misplaces objects or will forget if she locked the front door when she  left.She will sometimes have difficulty getting a word out.  Past DMT:  Tecfidera (stopped in early 2020.  She didn't feel  well on it - slurred speech, worse balance.  Also, she was tired with fighting with the insurance company to get it)  PAST MEDICAL HISTORY: Past Medical History:  Diagnosis Date  . Anemia   . Arthritis not really diagnosis but have pain  . B12 deficiency 07/23/2016   Will check levels to determine if injections are needed again. Await results. Recent CBC at Onc WNL  . Blood transfusion without reported diagnosis 1983 had a miscarriage/dnc  . Cancer ([place]) 12/30/2013   Multifocal disease: pT1c,m, N0(isolated tumor cells) Er/ PR positive, Her 2 negative.  . Cancer ([place]) 01/09/2014   Invasive lobular carcinoma, 2.2 cm, T2,N1 ER/PR positive, HER-2/neu negative  . Cataract 2014?  Marland Kitchen Diffuse cystic mastopathy   . Essential hypertension, benign 09/24/2017  . Family history of malignant neoplasm of gastrointestinal tract 2012  . Fractured elbow 08/28/14  . Gastroesophageal reflux disease   . Hx of metabolic acidosis with increased anion gap   . Increased heart rate   . Multiple sclerosis ([place])   . Neuromuscular disorder ([place])    neuropathy in bil feet - left is worse.  . Obesity, unspecified   . Peripheral neuropathy   . Personal history of tobacco use, presenting hazards to health   . Restless leg syndrome   . Sleep apnea    uses CPAP  . Special screening for malignant neoplasms, colon     MEDICATIONS: Current Outpatient Medications on File Prior to Visit  Medication Sig Dispense Refill  . acetaminophen (TYLENOL) 500 MG tablet Take 1,000 mg by mouth 2 (two) times daily as needed for moderate pain or headache.    . baclofen (LIORESAL) 10 MG tablet Take 1 tablet (10 mg total) by mouth 3 (three) times daily as needed for muscle spasms. 270 each 3  . calcium carbonate (CALCIUM 600) 1500 (600 Ca) MG TABS tablet Take by mouth 2 (two) times daily with a meal.    . diclofenac Sodium (VOLTAREN) 1 % GEL Apply 2 g topically 3 (three) times daily as needed. 100 g 2  . famotidine (PEPCID) 20 MG  tablet Take 1 tablet (20 mg total) by mouth 2 (two) times daily before a meal. 180 tablet 3  . fluticasone (FLONASE) 50 MCG/ACT nasal spray PLACE 2 SPRAYS INTO BOTH NOSTRILS AT BEDTIME. 48 g 1  . lisinopril (ZESTRIL) 5 MG tablet Take 1 tablet (5 mg total) by mouth every evening. 90 tablet 3  . Omega-3 Fatty Acids (FISH OIL) 1000 MG CAPS Take 1,000 mg by mouth 3 (three) times daily.     . polyethylene glycol powder (GLYCOLAX/MIRALAX) powder Take 17 g by mouth 2 (two) times daily as needed for moderate constipation. 3350 g 1  . traZODone (DESYREL) 100 MG tablet Take 1 tablet (100 mg total) by mouth at bedtime. 90 tablet 3  . venlafaxine XR (EFFEXOR-XR) 75 MG 24 hr capsule Take 1 capsule (75 mg total) by mouth daily with breakfast. 360 capsule 0  . vitamin B-12 (CYANOCOBALAMIN) 1000 MCG tablet Take 2,500 mcg by mouth daily.    . Vitamin D, Cholecalciferol, 50 MCG (2000 UT) CAPS Take 1 capsule by mouth every 3 (three) days.     No current facility-administered medications on file prior to visit.    ALLERGIES: No Known Allergies  FAMILY HISTORY: Family History  Problem Relation Age of Onset  . Cancer  Mother        lung  . COPD Mother   . Cancer Father        colon, stomach  . Diabetes Maternal Grandmother   . Hypertension Sister   . Rectal cancer Paternal Uncle   . Rectal cancer Paternal Uncle     SOCIAL HISTORY: Social History   Socioeconomic History  . Marital status: Widowed    Spouse name: Not on file  . Number of children: Not on file  . Years of education: some college  . Highest education level: High school graduate  Occupational History  . Occupation: retired  Tobacco Use  . Smoking status: Former Smoker    Packs/day: 1.00    Years: 30.00    Pack years: 30.00    Types: Cigarettes    Quit date: 12/11/2013    Years since quitting: 7.0  . Smokeless tobacco: Former Network engineer  . Vaping Use: Never used  Substance and Sexual Activity  . Alcohol use: No     Alcohol/week: 0.0 standard drinks    Comment: social  . Drug use: No  . Sexual activity: Never  Other Topics Concern  . Not on file  Social History Narrative  . Not on file   Social Determinants of Health   Financial Resource Strain: Not on file  Food Insecurity: Not on file  Transportation Needs: Not on file  Physical Activity: Not on file  Stress: Not on file  Social Connections: Not on file  Intimate Partner Violence: Not on file     Objective:  Blood pressure 135/81, pulse (!) 125, height 5' 5"  (1.651 m), weight 206 lb 9.6 oz (93.7 kg), last menstrual period 12/22/1999, SpO2 96 %. General: No acute distress.  Patient appears well-groomed.   Head:  Normocephalic/atraumatic Eyes:  Fundi examined but not visualized Neck: supple, no paraspinal tenderness, full range of motion Heart:  Regular rate and rhythm Lungs:  Clear to auscultation bilaterally Back: No paraspinal tenderness Neurological Exam: alert and oriented to person, place, and time. Attention span and concentration intact, recent and remote memory intact, fund of knowledge intact.  Speech fluent and not dysarthric, language intact.  CN II-XII intact. Bulk and tone normal, muscle strength 5/5 throughout.  Sensation to pinprick reduced over dorsum of left foot and anterior and posterior left lower leg and vibration reduced in feet.  Deep tendon reflexes trace in upper extremities and absent in lower extremitiest, toes downgoing.  Finger to nose and heel to shin testing intact.  Gait mildly unsteady.  Romberg negative.   Assessment/Plan:   1.  Multiple sclerosis.  Off DMT.   2.  Polyneuropathy, secondary to diabetes  1.  D3 2000 IU three days a week.  Repeat level in one year 2.  Baclofen for muscle cramps 3.  Follow up in one year  Metta Clines, DO  CC: Nobie Putnam, DO

## 2021-01-01 ENCOUNTER — Encounter: Payer: Self-pay | Admitting: Neurology

## 2021-01-01 ENCOUNTER — Other Ambulatory Visit: Payer: Self-pay

## 2021-01-01 ENCOUNTER — Ambulatory Visit (INDEPENDENT_AMBULATORY_CARE_PROVIDER_SITE_OTHER): Payer: Medicare Other | Admitting: Neurology

## 2021-01-01 VITALS — BP 135/81 | HR 125 | Ht 65.0 in | Wt 206.6 lb

## 2021-01-01 DIAGNOSIS — G35 Multiple sclerosis: Secondary | ICD-10-CM | POA: Diagnosis not present

## 2021-01-01 DIAGNOSIS — G629 Polyneuropathy, unspecified: Secondary | ICD-10-CM | POA: Diagnosis not present

## 2021-01-01 DIAGNOSIS — E559 Vitamin D deficiency, unspecified: Secondary | ICD-10-CM

## 2021-01-01 NOTE — Patient Instructions (Signed)
Continue baclofen and vitamin D Repeat vit D in one year Follow up in one year

## 2021-01-03 ENCOUNTER — Ambulatory Visit: Payer: Medicare Other | Admitting: Neurology

## 2021-02-04 ENCOUNTER — Ambulatory Visit: Payer: Medicare Other | Admitting: Dermatology

## 2021-03-10 ENCOUNTER — Other Ambulatory Visit: Payer: Self-pay | Admitting: Family Medicine

## 2021-03-10 DIAGNOSIS — F5101 Primary insomnia: Secondary | ICD-10-CM

## 2021-03-10 DIAGNOSIS — R809 Proteinuria, unspecified: Secondary | ICD-10-CM

## 2021-03-10 DIAGNOSIS — E1169 Type 2 diabetes mellitus with other specified complication: Secondary | ICD-10-CM

## 2021-03-11 DIAGNOSIS — M1711 Unilateral primary osteoarthritis, right knee: Secondary | ICD-10-CM | POA: Diagnosis not present

## 2021-03-20 ENCOUNTER — Other Ambulatory Visit: Payer: Self-pay | Admitting: Family Medicine

## 2021-03-20 DIAGNOSIS — F5101 Primary insomnia: Secondary | ICD-10-CM

## 2021-04-07 ENCOUNTER — Other Ambulatory Visit: Payer: Self-pay

## 2021-04-07 DIAGNOSIS — F5101 Primary insomnia: Secondary | ICD-10-CM

## 2021-04-08 ENCOUNTER — Other Ambulatory Visit: Payer: Self-pay | Admitting: Family Medicine

## 2021-04-08 DIAGNOSIS — K219 Gastro-esophageal reflux disease without esophagitis: Secondary | ICD-10-CM

## 2021-04-08 MED ORDER — VENLAFAXINE HCL ER 75 MG PO CP24: 75.0000 mg | ORAL_CAPSULE | Freq: Every day | ORAL | 0 refills | Status: DC

## 2021-04-09 ENCOUNTER — Other Ambulatory Visit: Payer: Self-pay | Admitting: Family Medicine

## 2021-04-09 DIAGNOSIS — R809 Proteinuria, unspecified: Secondary | ICD-10-CM

## 2021-04-09 DIAGNOSIS — E1169 Type 2 diabetes mellitus with other specified complication: Secondary | ICD-10-CM

## 2021-04-09 DIAGNOSIS — F5101 Primary insomnia: Secondary | ICD-10-CM

## 2021-04-23 ENCOUNTER — Encounter: Payer: Self-pay | Admitting: Family Medicine

## 2021-04-23 ENCOUNTER — Ambulatory Visit: Payer: Medicare Other | Admitting: Family Medicine

## 2021-04-23 ENCOUNTER — Other Ambulatory Visit: Payer: Self-pay

## 2021-04-23 ENCOUNTER — Other Ambulatory Visit: Payer: Self-pay | Admitting: Family Medicine

## 2021-04-23 ENCOUNTER — Ambulatory Visit (INDEPENDENT_AMBULATORY_CARE_PROVIDER_SITE_OTHER): Payer: Medicare Other | Admitting: Family Medicine

## 2021-04-23 VITALS — BP 122/70 | Ht 65.0 in | Wt 206.0 lb

## 2021-04-23 DIAGNOSIS — G35 Multiple sclerosis: Secondary | ICD-10-CM

## 2021-04-23 DIAGNOSIS — M170 Bilateral primary osteoarthritis of knee: Secondary | ICD-10-CM

## 2021-04-23 DIAGNOSIS — Z8639 Personal history of other endocrine, nutritional and metabolic disease: Secondary | ICD-10-CM

## 2021-04-23 DIAGNOSIS — E1169 Type 2 diabetes mellitus with other specified complication: Secondary | ICD-10-CM | POA: Diagnosis not present

## 2021-04-23 DIAGNOSIS — R0789 Other chest pain: Secondary | ICD-10-CM

## 2021-04-23 DIAGNOSIS — E785 Hyperlipidemia, unspecified: Secondary | ICD-10-CM

## 2021-04-23 DIAGNOSIS — Z853 Personal history of malignant neoplasm of breast: Secondary | ICD-10-CM | POA: Diagnosis not present

## 2021-04-23 DIAGNOSIS — E559 Vitamin D deficiency, unspecified: Secondary | ICD-10-CM

## 2021-04-23 DIAGNOSIS — N3941 Urge incontinence: Secondary | ICD-10-CM | POA: Diagnosis not present

## 2021-04-23 DIAGNOSIS — I10 Essential (primary) hypertension: Secondary | ICD-10-CM

## 2021-04-23 LAB — POCT GLYCOSYLATED HEMOGLOBIN (HGB A1C): Hemoglobin A1C: 7.3 % — AB (ref 4.0–5.6)

## 2021-04-23 MED ORDER — METFORMIN HCL 500 MG PO TABS: 500.0000 mg | ORAL_TABLET | Freq: Two times a day (BID) | ORAL | 3 refills | Status: DC

## 2021-04-23 NOTE — Patient Instructions (Addendum)
Thank you for coming to the office today.  Referrals in.  Please schedule a Follow-up Appointment to: Return in about 6 months (around 10/23/2021) for 6 month fasting lab only then 1 week later [facility].  If you have any other questions or concerns, please feel free to call the office or send a message through [place]. You may also schedule an earlier appointment if necessary.  Additionally, you may be receiving a survey about your experience at our office within a few days to 1 week by e-mail or mail. We value your feedback.  Nobie Putnam, DO [place]

## 2021-04-23 NOTE — Progress Notes (Signed)
Subjective:    Patient ID: Cassidy Norris, female    DOB: December 04, 1950, 70 y.o.   MRN: [id number]  Cassidy Norris is a 70 y.o. female presenting on 04/23/2021 for Knee Pain   HPI   ENT apt on Friday in 2 days Admits feeling vertigo problem with crystals in inner  History of hearing abnormal sound in ear, question if circulation or inner ear.  Multiple Sclerosis She feels like her balance problem can be related. But her Neurologist says it is not MS  Right Knee, chronic pain Needs surgery. She had problem with Left knee surgery 10/2015 and has had persistent post-op pain resulting. Seems to have persistent pains and popping at times. - She was followed by [facility] in [place]. She is requesting new orthopedic doctor.  Urinary Incontinence, stress Reports new problem stress incontinence. Leakage with straining or coughing. Not having other problems with urination, has not seen urology, asks about referral  Type 2 Diabetes Elevated sugar, due for A1c today Off Metformin. Previously on 1-2 per day.  Chest Pain Atypical History of breast cancer 2015, double mastectomy Has had pains in chest wall and ribs across center to bilateral and into R side in flank and back. Worse with movements. Not with dyspnea or sweating episode.   Past Surgical History:  Procedure Laterality Date  . BREAST BIOPSY Right 1973,2001  . BREAST BIOPSY Right 11-07-13   INVASIVE MAMMARY CARCINOMA , ER/PR positive Her2 negative  . BREAST BIOPSY Left 11-27-13   invasive lobular and DCIS  . BREAST SURGERY Bilateral 01/09/14   mastectomy  . CHOLECYSTECTOMY    . COLONOSCOPY  2010   Dr. Jamal Collin  . COLONOSCOPY WITH PROPOFOL N/A 06/11/2015   Procedure: COLONOSCOPY WITH PROPOFOL;  Surgeon: Christene Lye, MD;  Location: [facility] ENDOSCOPY;  Service: Endoscopy;  Laterality: N/A;  . DILATATION & CURETTAGE/HYSTEROSCOPY WITH MYOSURE N/A 07/12/2018   Procedure: DILATATION & CURETTAGE/HYSTEROSCOPY  WITH MYOSURE WITH ENDOMETRIAL POLYPECTOMY;  Surgeon: Will Bonnet, MD;  Location: [facility] ORS;  Service: Gynecology;  Laterality: N/A;  . DILATION AND CURETTAGE OF UTERUS  1983  . JOINT REPLACEMENT  11/23/2015  . POLYPECTOMY  1989  . PORTA CATH REMOVAL    . PORTACATH PLACEMENT  2015  . SPINE SURGERY  09/14/14   Ruptured disk L4- L5  . TOTAL KNEE ARTHROPLASTY Left 11/22/2015   Procedure: TOTAL KNEE ARTHROPLASTY;  Surgeon: Earlie Server, MD;  Location: [place];  Service: Orthopedics;  Laterality: Left;  . TUBAL LIGATION    . WISDOM TOOTH EXTRACTION  2006    Depression screen [facility] 2/9 10/21/2020 04/19/2020 05/09/2019  Decreased Interest 0 0 0  Down, Depressed, Hopeless 0 0 0  PHQ - 2 Score 0 0 0  Altered sleeping - - -  Tired, decreased energy - - -  Change in appetite - - -  Feeling bad or failure about yourself  - - -  Trouble concentrating - - -  Moving slowly or fidgety/restless - - -  Suicidal thoughts - - -  PHQ-9 Score - - -  Some recent data might be hidden    Social History   Tobacco Use  . Smoking status: Former Smoker    Packs/day: 1.00    Years: 30.00    Pack years: 30.00    Types: Cigarettes    Quit date: 12/11/2013    Years since quitting: 7.3  . Smokeless tobacco: Former Network engineer  . Vaping Use: Never used  Substance  Use Topics  . Alcohol use: No    Alcohol/week: 0.0 standard drinks    Comment: social  . Drug use: No    Review of Systems Per HPI unless specifically indicated above     Objective:    BP 122/70 (BP Location: Left Arm, Cuff Size: Normal)   Ht _0  (1.651 m)   Wt 206 lb (93.4 kg)   LMP 12/22/1999 (Approximate)   BMI 34.28 kg/m   Wt Readings from Last 3 Encounters:  04/23/21 206 lb (93.4 kg)  01/01/21 206 lb 9.6 oz (93.7 kg)  07/02/20 200 lb (90.7 kg)    Physical Exam Vitals and nursing note reviewed.  Constitutional:      General: She is not in acute distress.    Appearance: She is well-developed. She is not  diaphoretic.     Comments: Well-appearing, comfortable, cooperative  HENT:     Head: Normocephalic and atraumatic.  Eyes:     General:        Right eye: No discharge.        Left eye: No discharge.     Conjunctiva/sclera: Conjunctivae normal.  Neck:     Thyroid: No thyromegaly.  Cardiovascular:     Rate and Rhythm: Normal rate and regular rhythm.     Heart sounds: Normal heart sounds. No murmur heard.   Pulmonary:     Effort: Pulmonary effort is normal. No respiratory distress.     Breath sounds: Normal breath sounds. No wheezing or rales.  Chest:     Chest wall: No tenderness.  Musculoskeletal:        General: Normal range of motion.     Cervical back: Normal range of motion and neck supple.     Comments: Right knee with some edema effusion.  L knee post surgical incision well healed.  Lymphadenopathy:     Cervical: No cervical adenopathy.  Skin:    General: Skin is warm and dry.     Findings: No erythema or rash.  Neurological:     Mental Status: She is alert and oriented to person, place, and time.  Psychiatric:        Behavior: Behavior normal.     Comments: Well groomed, good eye contact, normal speech and thoughts    Results for orders placed or performed in visit on 04/23/21  POCT glycosylated hemoglobin (Hb A1C)  Result Value Ref Range   Hemoglobin A1C 7.3 (A) 4.0 - 5.6 %      Assessment & Plan:   Problem List Items Addressed This Visit    Type 2 diabetes mellitus with other specified complication ([place])   Relevant Medications   metFORMIN (GLUCOPHAGE) 500 MG tablet   Other Relevant Orders   POCT glycosylated hemoglobin (Hb A1C) (Completed)   Osteoarthritis of knee   Relevant Orders   Ambulatory referral to Orthopedic Surgery   Multiple sclerosis [facility])   Relevant Orders   Ambulatory referral to Urology   History of bilateral breast cancer   Relevant Orders   Ambulatory referral to Hematology / Oncology    Other Visit Diagnoses    Chest wall pain     -  Primary   Urge incontinence       Relevant Orders   Ambulatory referral to Urology      Type 2 DM A1c mild elevated today to DM 7.3, prior 6 range. Counseling on lifestyle We will restart Metformin IR 557m BID dosing today   Chest wall pain History of bilateral breast  cancer, s/p mastectomy  previously followed by Dr Grayland Ormond history of double mastectomy breast cancer, surgery, 2015, has had some atypical chest pain and R sided rib pain at times, questioning if related to her surgery. No evidence of cardiac cause.  She is requesting to return to Oncology for surveillance and concern about recurrence with her new pain.   Will agree to send back to [facility] to new provider for evaluation. Discussed MSK Etiology as likely for the pain she describes in chest wall and rib and back. Seems to be provoked with functional movements / positional pain. Clinically not consistent with cardiac etiology Trial Tylenol, NSAID, topical NSAID, muscle relaxant PRN and follow-up if indicated. Future can do x-ray if indicated.  --------------- MS Followed by Neurology  Concern now with Stress Urinary Incontinence, may have mixed picture with possible Neurogenic Bladder contributing or other bladder dysfunction - may be related to MS.  Referral to Urology for stress incontinence and eval for other types of incontinence.  ---------  Right Knee, osteoarthritis chronic pain  Previously chronic right knee pain and arthritis, history of left knee replacement by Dr Raliegh Ip [place] in 2016, has had some complications, she is interested in R knee surgery arthroplasty has failed synvisc and steroid injections R knee.   Referral to [facility] Dr Marry Guan as requested.   Orders Placed This Encounter  Procedures  . Ambulatory referral to Orthopedic Surgery    Referral Priority:   Routine    Referral Type:   Surgical    Referral Reason:   Specialty Services Required    Requested Specialty:    Orthopedic Surgery    Number of Visits Requested:   1  . Ambulatory referral to Urology    Referral Priority:   Routine    Referral Type:   Consultation    Referral Reason:   Specialty Services Required    Requested Specialty:   Urology    Number of Visits Requested:   1  . Ambulatory referral to Hematology / Oncology    Referral Priority:   Routine    Referral Type:   Consultation    Referral Reason:   Specialty Services Required    Requested Specialty:   Oncology    Number of Visits Requested:   1  . POCT glycosylated hemoglobin (Hb A1C)     Meds ordered this encounter  Medications  . metFORMIN (GLUCOPHAGE) 500 MG tablet    Sig: Take 1 tablet (500 mg total) by mouth 2 (two) times daily with a meal.    Dispense:  180 tablet    Refill:  3      Follow up plan: Return in about 6 months (around 10/23/2021) for 6 month fasting lab only then 1 week later [employer].  Future labs ordered for 10/20/21  Nobie Putnam, [facility] 04/23/2021, 9:09 AM

## 2021-04-25 DIAGNOSIS — H8113 Benign paroxysmal vertigo, bilateral: Secondary | ICD-10-CM | POA: Diagnosis not present

## 2021-04-25 DIAGNOSIS — H93A2 Pulsatile tinnitus, left ear: Secondary | ICD-10-CM | POA: Diagnosis not present

## 2021-04-25 DIAGNOSIS — H9313 Tinnitus, bilateral: Secondary | ICD-10-CM | POA: Diagnosis not present

## 2021-04-25 DIAGNOSIS — H6983 Other specified disorders of Eustachian tube, bilateral: Secondary | ICD-10-CM | POA: Diagnosis not present

## 2021-04-30 ENCOUNTER — Ambulatory Visit (INDEPENDENT_AMBULATORY_CARE_PROVIDER_SITE_OTHER): Payer: Medicare Other | Admitting: Urology

## 2021-04-30 ENCOUNTER — Encounter: Payer: Self-pay | Admitting: Urology

## 2021-04-30 ENCOUNTER — Other Ambulatory Visit: Payer: Self-pay

## 2021-04-30 VITALS — BP 113/72 | HR 118 | Ht 65.5 in | Wt 209.0 lb

## 2021-04-30 DIAGNOSIS — N393 Stress incontinence (female) (male): Secondary | ICD-10-CM | POA: Diagnosis not present

## 2021-04-30 DIAGNOSIS — N3941 Urge incontinence: Secondary | ICD-10-CM | POA: Diagnosis not present

## 2021-04-30 LAB — MICROSCOPIC EXAMINATION
Epithelial Cells (non renal): >10 /hpf — ABNORMAL HIGH (ref 0–10)
WBC, UA: >30 /hpf — ABNORMAL HIGH (ref 0–5)

## 2021-04-30 LAB — URINALYSIS, COMPLETE
Bilirubin, UA: NEGATIVE
Glucose, UA: NEGATIVE
Ketones, UA: NEGATIVE
Nitrite, UA: POSITIVE — AB
Protein,UA: NEGATIVE
Specific Gravity, UA: 1.02 (ref 1.005–1.030)
Urobilinogen, Ur: 0.2 mg/dL (ref 0.2–1.0)
pH, UA: 6 (ref 5.0–7.5)

## 2021-04-30 LAB — BLADDER SCAN AMB NON-IMAGING

## 2021-04-30 NOTE — Patient Instructions (Signed)
Urinary Incontinence  Urinary incontinence refers to a condition in which a person is unable to control where and when to pass urine. A person with this condition will urinate when he or she does not mean to (involuntarily). What are the causes? This condition may be caused by:  Medicines.  Infections.  Constipation.  Overactive bladder muscles.  Weak bladder muscles.  Weak pelvic floor muscles. These muscles provide support for the bladder, intestine, and, in women, the uterus.  Enlarged prostate in men. The prostate is a gland near the bladder. When it gets too big, it can pinch the urethra. With the urethra blocked, the bladder can weaken and lose the ability to empty properly.  Surgery.  Emotional factors, such as anxiety, stress, or post-traumatic stress disorder (PTSD).  Pelvic organ prolapse. This happens in women when organs shift out of place and into the vagina. This shift can prevent the bladder and urethra from working properly. What increases the risk? The following factors may make you more likely to develop this condition:  Older age.  Obesity and physical inactivity.  Pregnancy and childbirth.  Menopause.  Diseases that affect the nerves or spinal cord (neurological diseases).  Long-term (chronic) coughing. This can increase pressure on the bladder and pelvic floor muscles. What are the signs or symptoms? Symptoms may vary depending on the type of urinary incontinence you have. They include:  A sudden urge to urinate, but passing urine involuntarily before you can get to a bathroom (urge incontinence).  Suddenly passing urine with any activity that forces urine to pass, such as coughing, laughing, exercise, or sneezing (stress incontinence).  Needing to urinate often, but urinating only a small amount, or constantly dribbling urine (overflow incontinence).  Urinating because you cannot get to the bathroom in time due to a physical disability, such as  arthritis or injury, or communication and thinking problems, such as Alzheimer disease (functional incontinence). How is this diagnosed? This condition may be diagnosed based on:  Your medical history.  A physical exam.  Tests, such as: ? Urine tests. ? X-rays of your kidney and bladder. ? Ultrasound. ? CT scan. ? Cystoscopy. In this procedure, a health care provider inserts a tube with a light and camera (cystoscope) through the urethra and into the bladder in order to check for problems. ? Urodynamic testing. These tests assess how well the bladder, urethra, and sphincter can store and release urine. There are different types of urodynamic tests, and they vary depending on what the test is measuring. To help diagnose your condition, your health care provider may recommend that you keep a log of when you urinate and how much you urinate. How is this treated? Treatment for this condition depends on the type of incontinence that you have and its cause. Treatment may include:  Lifestyle changes, such as: ? Quitting smoking. ? Maintaining a healthy weight. ? Staying active. Try to get 150 minutes of moderate-intensity exercise every week. Ask your health care provider which activities are safe for you. ? Eating a healthy diet.  Avoid high-fat foods, like fried foods.  Avoid refined carbohydrates like white bread and white rice.  Limit how much alcohol and caffeine you drink.  Increase your fiber intake. Foods such as fresh fruits, vegetables, beans, and whole grains are healthy sources of fiber.  Pelvic floor muscle exercises.  Bladder training, such as lengthening the amount of time between bathroom breaks, or using the bathroom at regular intervals.  Using techniques to suppress bladder urges.   This can include distraction techniques or controlled breathing exercises.  Medicines to relax the bladder muscles and prevent bladder spasms.  Medicines to help slow or prevent the  growth of a man's prostate.  Botox injections. These can help relax the bladder muscles.  Using pulses of electricity to help change bladder reflexes (electrical nerve stimulation).  For women, using a medical device to prevent urine leaks. This is a small, tampon-like, disposable device that is inserted into the urethra.  Injecting collagen or carbon beads (bulking agents) into the urinary sphincter. These can help thicken tissue and close the bladder opening.  Surgery. Follow these instructions at home: Lifestyle  Limit alcohol and caffeine. These can fill your bladder quickly and irritate it.  Keep yourself clean to help prevent odors and skin damage. Ask your doctor about special skin creams and cleansers that can protect the skin from urine.  Consider wearing pads or adult diapers. Make sure to change them regularly, and always change them right after experiencing incontinence. General instructions  Take over-the-counter and prescription medicines only as told by your health care provider.  Use the bathroom about every 3-4 hours, even if you do not feel the need to urinate. Try to empty your bladder completely every time. After urinating, wait a minute. Then try to urinate again.  Make sure you are in a relaxed position while urinating.  If your incontinence is caused by nerve problems, keep a log of the medicines you take and the times you go to the bathroom.  Keep all follow-up visits as told by your health care provider. This is important. Contact a health care provider if:  You have pain that gets worse.  Your incontinence gets worse. Get help right away if:  You have a fever or chills.  You are unable to urinate.  You have redness in your groin area or down your legs. Summary  Urinary incontinence refers to a condition in which a person is unable to control where and when to pass urine.  This condition may be caused by medicines, infection, weak bladder  muscles, weak pelvic floor muscles, enlargement of the prostate (in men), or surgery.  The following factors increase your risk for developing this condition: older age, obesity, pregnancy and childbirth, menopause, neurological diseases, and chronic coughing.  There are several types of urinary incontinence. They include urge incontinence, stress incontinence, overflow incontinence, and functional incontinence.  This condition is usually treated first with lifestyle and behavioral changes, such as quitting smoking, eating a healthier diet, and doing regular pelvic floor exercises. Other treatment options include medicines, bulking agents, medical devices, electrical nerve stimulation, or surgery. This information is not intended to replace advice given to you by your health care provider. Make sure you discuss any questions you have with your health care provider. Document Revised: 11/19/2017 Document Reviewed: 02/18/2017 Elsevier Patient Education  [place].   Kegel Exercises  Kegel exercises can help strengthen your pelvic floor muscles. The pelvic floor is a group of muscles that support your rectum, small intestine, and bladder. In females, pelvic floor muscles also help support the womb (uterus). These muscles help you control the flow of urine and stool. Kegel exercises are painless and simple, and they do not require any equipment. Your provider may suggest Kegel exercises to:  Improve bladder and bowel control.  Improve sexual response.  Improve weak pelvic floor muscles after surgery to remove the uterus (hysterectomy) or pregnancy (females).  Improve weak pelvic floor muscles after  prostate gland removal or surgery (males). Kegel exercises involve squeezing your pelvic floor muscles, which are the same muscles you squeeze when you try to stop the flow of urine or keep from passing gas. The exercises can be done while sitting, standing, or lying down, but it is best to  vary your position. Exercises How to do Kegel exercises: 1. Squeeze your pelvic floor muscles tight. You should feel a tight lift in your rectal area. If you are a female, you should also feel a tightness in your vaginal area. Keep your stomach, buttocks, and legs relaxed. 2. Hold the muscles tight for up to 10 seconds. 3. Breathe normally. 4. Relax your muscles. 5. Repeat as told by your health care provider. Repeat this exercise daily as told by your health care provider. Continue to do this exercise for at least 4-6 weeks, or for as long as told by your health care provider. You may be referred to a physical therapist who can help you learn more about how to do Kegel exercises. Depending on your condition, your health care provider may recommend:  Varying how long you squeeze your muscles.  Doing several sets of exercises every day.  Doing exercises for several weeks.  Making Kegel exercises a part of your regular exercise routine. This information is not intended to replace advice given to you by your health care provider. Make sure you discuss any questions you have with your health care provider. Document Revised: 03/15/2020 Document Reviewed: 06/29/2018 Elsevier Patient Education  [place].

## 2021-04-30 NOTE — Progress Notes (Signed)
04/30/21 10:15 AM   Cassidy Norris 11-21-1951 [id number]  CC: Stress incontinence  HPI: 70 year old female with history of MS who presents with about 6 months of mild stress incontinence.  Her MS was diagnosed in 2003 and she has not been on medications and has done extremely well.  She denies any urgency, frequency, urge incontinence, or nocturia.  She denies any gross hematuria or problems with UTIs.  She drinks primarily Pepsi and pineapple juice during the day.  Her primary complaint is mild leakage with coughing and sneezing when she has a full bladder.  She has a history of breast cancer, but no history of hysterectomy.  PVR is normal today at 14 mL.  Urinalysis is contaminated today with greater than 10 epithelial cells, greater than 30 WBCs, 0-2 RBCs, many bacteria, 2+ leukocytes, nitrite positive.  We will not send for culture.   PMH: Past Medical History:  Diagnosis Date  . Anemia   . Arthritis not really diagnosis but have pain  . B12 deficiency 07/23/2016   Will check levels to determine if injections are needed again. Await results. Recent CBC at [facility]  . Blood transfusion without reported diagnosis 1983 had a miscarriage/dnc  . Cancer ([place]) 12/30/2013   Multifocal disease: pT1c,m, N0(isolated tumor cells) Er/ PR positive, Her 2 negative.  . Cancer ([place]) 01/09/2014   Invasive lobular carcinoma, 2.2 cm, T2,N1 ER/PR positive, HER-2/neu negative  . Cataract 2014?  Marland Kitchen Diffuse cystic mastopathy   . Essential hypertension, benign 09/24/2017  . Family history of malignant neoplasm of gastrointestinal tract 2012  . Fractured elbow 08/28/14  . Gastroesophageal reflux disease   . Hx of metabolic acidosis with increased anion gap   . Increased heart rate   . Multiple sclerosis ([place])   . Neuromuscular disorder ([place])    neuropathy in bil feet - left is worse.  . Obesity, unspecified   . Peripheral neuropathy   . Personal history of tobacco use, presenting hazards to  health   . Restless leg syndrome   . Sleep apnea    uses CPAP  . Special screening for malignant neoplasms, colon     Surgical History: Past Surgical History:  Procedure Laterality Date  . BREAST BIOPSY Right 1973,2001  . BREAST BIOPSY Right 11-07-13   INVASIVE MAMMARY CARCINOMA , ER/PR positive Her2 negative  . BREAST BIOPSY Left 11-27-13   invasive lobular and DCIS  . BREAST SURGERY Bilateral 01/09/14   mastectomy  . CHOLECYSTECTOMY    . COLONOSCOPY  2010   Dr. Jamal Collin  . COLONOSCOPY WITH PROPOFOL N/A 06/11/2015   Procedure: COLONOSCOPY WITH PROPOFOL;  Surgeon: Christene Lye, MD;  Location: [facility];  Service: Endoscopy;  Laterality: N/A;  . DILATATION & CURETTAGE/HYSTEROSCOPY WITH MYOSURE N/A 07/12/2018   Procedure: DILATATION & CURETTAGE/HYSTEROSCOPY WITH MYOSURE WITH ENDOMETRIAL POLYPECTOMY;  Surgeon: Will Bonnet, MD;  Location: [facility];  Service: Gynecology;  Laterality: N/A;  . DILATION AND CURETTAGE OF UTERUS  1983  . JOINT REPLACEMENT  11/23/2015  . POLYPECTOMY  1989  . PORTA CATH REMOVAL    . PORTACATH PLACEMENT  2015  . SPINE SURGERY  09/14/14   Ruptured disk L4- L5  . TOTAL KNEE ARTHROPLASTY Left 11/22/2015   Procedure: TOTAL KNEE ARTHROPLASTY;  Surgeon: Earlie Server, MD;  Location: [facility];  Service: Orthopedics;  Laterality: Left;  . TUBAL LIGATION    . WISDOM TOOTH EXTRACTION  2006     Family History: Family History  Problem Relation Age  of Onset  . Cancer Mother        lung  . COPD Mother   . Cancer Father        colon, stomach  . Diabetes Maternal Grandmother   . Hypertension Sister   . Rectal cancer Paternal Uncle   . Rectal cancer Paternal Uncle     Social History:  reports that she quit smoking about 7 years ago. Her smoking use included cigarettes. She has a 30.00 pack-year smoking history. She has quit using smokeless tobacco. She reports that she does not drink alcohol and does not use drugs.  Physical Exam: BP 113/72 (BP  Location: Left Arm, Patient Position: Sitting, Cuff Size: Large)   Pulse (!) 118   Ht 5' 5.5" (1.664 m)   Wt 209 lb (94.8 kg)   LMP 12/22/1999 (Approximate)   BMI 34.25 kg/m    Constitutional:  Alert and oriented, No acute distress. Cardiovascular: No clubbing, cyanosis, or edema. Respiratory: Normal respiratory effort, no increased work of breathing. GI: Abdomen is soft, nontender, nondistended, no abdominal masses  Laboratory Data: Reviewed, see HPI  Pertinent Imaging: None to review  Assessment & Plan:   70 year old female with mild stress incontinence.  We discussed strategies ranging from Kegel exercises and pelvic floor physical therapy, pessary, to various surgical options including urethral sling and urethral bulking.  She is minimally bothered by her symptoms at this time, and would like to start with Kegel exercises and timed voiding.  Return precautions discussed.  She is emptying well with a normal PVR 14 mL.  Start with Kegel exercises for mild stress incontinence RTC with PA 3 to 6 months   Nickolas Madrid, MD 04/30/2021  [facility] [address] [place], [place] [address] [phone]

## 2021-05-02 DIAGNOSIS — H8113 Benign paroxysmal vertigo, bilateral: Secondary | ICD-10-CM | POA: Diagnosis not present

## 2021-05-07 DIAGNOSIS — H8112 Benign paroxysmal vertigo, left ear: Secondary | ICD-10-CM | POA: Diagnosis not present

## 2021-05-15 DIAGNOSIS — H1045 Other chronic allergic conjunctivitis: Secondary | ICD-10-CM | POA: Diagnosis not present

## 2021-05-15 DIAGNOSIS — E119 Type 2 diabetes mellitus without complications: Secondary | ICD-10-CM | POA: Diagnosis not present

## 2021-05-15 DIAGNOSIS — H2513 Age-related nuclear cataract, bilateral: Secondary | ICD-10-CM | POA: Diagnosis not present

## 2021-05-15 LAB — HM DIABETES EYE EXAM

## 2021-05-20 ENCOUNTER — Other Ambulatory Visit: Payer: Self-pay | Admitting: Family Medicine

## 2021-05-20 DIAGNOSIS — K219 Gastro-esophageal reflux disease without esophagitis: Secondary | ICD-10-CM

## 2021-07-02 ENCOUNTER — Other Ambulatory Visit: Payer: Self-pay | Admitting: Family Medicine

## 2021-07-02 DIAGNOSIS — F5101 Primary insomnia: Secondary | ICD-10-CM

## 2021-07-02 DIAGNOSIS — K219 Gastro-esophageal reflux disease without esophagitis: Secondary | ICD-10-CM

## 2021-07-31 ENCOUNTER — Encounter: Payer: Self-pay | Admitting: Physician Assistant

## 2021-07-31 ENCOUNTER — Ambulatory Visit (INDEPENDENT_AMBULATORY_CARE_PROVIDER_SITE_OTHER): Payer: Medicare Other | Admitting: Physician Assistant

## 2021-07-31 ENCOUNTER — Other Ambulatory Visit: Payer: Self-pay

## 2021-07-31 VITALS — BP 107/70 | HR 79 | Ht 65.0 in | Wt 208.0 lb

## 2021-07-31 DIAGNOSIS — N393 Stress incontinence (female) (male): Secondary | ICD-10-CM

## 2021-07-31 LAB — BLADDER SCAN AMB NON-IMAGING

## 2021-07-31 NOTE — Progress Notes (Signed)
07/31/2021 9:10 AM   Cassidy Norris 31-Dec-1950 [id number]  CC: Chief Complaint  Patient presents with   Urinary Incontinence    HPI: Cassidy Norris is a 70 y.o. female with PMH MS and mild stress incontinence who presents today for symptom recheck after 3 months of Kegel exercises and timed voiding per Dr. Diamantina Providence.   Today she reports she has been doing Kegel exercises as instructed.  She has not been doing true timed voiding, however she states she has been intentionally voiding more frequently than prior and not allowing herself to get too full.  As a result of these changes, she has only had 2 episodes of stress incontinence in the last 3 months.  Overall, she is very pleased and has no acute concerns.  PVR 2 mL.  PMH: Past Medical History:  Diagnosis Date   Anemia    Arthritis not really diagnosis but have pain   B12 deficiency 07/23/2016   Will check levels to determine if injections are needed again. Await results. Recent CBC at [facility]   Blood transfusion without reported diagnosis 1983 had a miscarriage/dnc   Cancer ([place]) 12/30/2013   Multifocal disease: pT1c,m, N0(isolated tumor cells) Er/ PR positive, Her 2 negative.   Cancer ([place]) 01/09/2014   Invasive lobular carcinoma, 2.2 cm, T2,N1 ER/PR positive, HER-2/neu negative   Cataract 2014?   Diffuse cystic mastopathy    Essential hypertension, benign 09/24/2017   Family history of malignant neoplasm of gastrointestinal tract 2012   Fractured elbow 08/28/14   Gastroesophageal reflux disease    Hx of metabolic acidosis with increased anion gap    Increased heart rate    Multiple sclerosis ([facility])    Neuromuscular disorder ([facility])    neuropathy in bil feet - left is worse.   Obesity, unspecified    Peripheral neuropathy    Personal history of tobacco use, presenting hazards to health    Restless leg syndrome    Sleep apnea    uses CPAP   Special screening for malignant neoplasms, colon     Surgical History: Past  Surgical History:  Procedure Laterality Date   BREAST BIOPSY Right 1973,2001   BREAST BIOPSY Right 11-07-13   INVASIVE MAMMARY CARCINOMA , ER/PR positive Her2 negative   BREAST BIOPSY Left 11-27-13   invasive lobular and DCIS   BREAST SURGERY Bilateral 01/09/14   mastectomy   CHOLECYSTECTOMY     COLONOSCOPY  2010   Dr. Jamal Collin   COLONOSCOPY WITH PROPOFOL N/A 06/11/2015   Procedure: COLONOSCOPY WITH PROPOFOL;  Surgeon: Christene Lye, MD;  Location: [facility];  Service: Endoscopy;  Laterality: N/A;   DILATATION & CURETTAGE/HYSTEROSCOPY WITH MYOSURE N/A 07/12/2018   Procedure: DILATATION & CURETTAGE/HYSTEROSCOPY WITH MYOSURE WITH ENDOMETRIAL POLYPECTOMY;  Surgeon: Will Bonnet, MD;  Location: [facility];  Service: Gynecology;  Laterality: N/A;   DILATION AND CURETTAGE OF UTERUS  1983   JOINT REPLACEMENT  11/23/2015   POLYPECTOMY  1989   PORTA CATH REMOVAL     PORTACATH PLACEMENT  2015   SPINE SURGERY  09/14/14   Ruptured disk L4- L5   TOTAL KNEE ARTHROPLASTY Left 11/22/2015   Procedure: TOTAL KNEE ARTHROPLASTY;  Surgeon: Earlie Server, MD;  Location: [facility];  Service: Orthopedics;  Laterality: Left;   TUBAL LIGATION     WISDOM TOOTH EXTRACTION  2006    Home Medications:  Allergies as of 07/31/2021   No Known Allergies      Medication List  Accurate as of July 31, 2021  9:10 AM. If you have any questions, ask your nurse or doctor.          acetaminophen 500 MG tablet Commonly known as: TYLENOL Take 1,000 mg by mouth 2 (two) times daily as needed for moderate pain or headache.   baclofen 10 MG tablet Commonly known as: LIORESAL Take 1 tablet (10 mg total) by mouth 3 (three) times daily as needed for muscle spasms.   calcium carbonate 1500 (600 Ca) MG Tabs tablet Commonly known as: OSCAL Take by mouth 2 (two) times daily with a meal.   diclofenac Sodium 1 % Gel Commonly known as: VOLTAREN Apply 2 g topically 3 (three) times daily as needed.    famotidine 20 MG tablet Commonly known as: PEPCID TAKE 1 TABLET BY MOUTH TWICE A DAY BEFORE MEALS   fluticasone 50 MCG/ACT nasal spray Commonly known as: FLONASE PLACE 2 SPRAYS INTO BOTH NOSTRILS AT BEDTIME.   Krill Oil 1000 MG Caps Take by mouth.   lisinopril 5 MG tablet Commonly known as: ZESTRIL TAKE 1 TABLET BY MOUTH EVERY EVENING   Melatonin 10 MG Caps Take by mouth.   metFORMIN 500 MG tablet Commonly known as: GLUCOPHAGE Take 1 tablet (500 mg total) by mouth 2 (two) times daily with a meal.   polyethylene glycol powder 17 GM/SCOOP powder Commonly known as: GLYCOLAX/MIRALAX Take 17 g by mouth 2 (two) times daily as needed for moderate constipation.   traZODone 100 MG tablet Commonly known as: DESYREL TAKE 1 TABLET BY MOUTH EVERYDAY AT BEDTIME   venlafaxine XR 75 MG 24 hr capsule Commonly known as: EFFEXOR-XR Take 1 capsule (75 mg total) by mouth daily with breakfast.   vitamin B-12 1000 MCG tablet Commonly known as: CYANOCOBALAMIN Take 2,500 mcg by mouth daily.   vitamin D3 50 MCG (2000 UT) Caps Take 1 capsule by mouth every 3 (three) days.        Allergies:  No Known Allergies  Family History: Family History  Problem Relation Age of Onset   Cancer Mother        lung   COPD Mother    Cancer Father        colon, stomach   Diabetes Maternal Grandmother    Hypertension Sister    Rectal cancer Paternal Uncle    Rectal cancer Paternal Uncle     Social History:   reports that she quit smoking about 7 years ago. Her smoking use included cigarettes. She has a 30.00 pack-year smoking history. She has quit using smokeless tobacco. She reports that she does not drink alcohol and does not use drugs.  Physical Exam: BP 107/70   Pulse 79   Ht 5' 5"  (1.651 m)   Wt 208 lb (94.3 kg)   LMP 12/22/1999 (Approximate)   BMI 34.61 kg/m   Constitutional:  Alert and oriented, no acute distress, nontoxic appearing HEENT: [place], AT Cardiovascular: No clubbing,  cyanosis, or edema Respiratory: Normal respiratory effort, no increased work of breathing Skin: No rashes, bruises or suspicious lesions Neurologic: Grossly intact, no focal deficits, moving all 4 extremities Psychiatric: Normal mood and affect  Laboratory Data: Results for orders placed or performed in visit on 07/31/21  Bladder Scan (Post Void Residual) in office  Result Value Ref Range   Scan Result 83m    Assessment & Plan:   1. Stress incontinence Nearly resolved on Kegel exercises and increased frequency of voiding.  No further intervention indicated.  We discussed consideration  of pelvic floor physical therapy in the future if her symptoms worsen.  Patient expressed understanding.  Okay to follow-up as needed. - Bladder Scan (Post Void Residual) in office  Return if symptoms worsen or fail to improve.  Debroah Loop, PA-C  [facility] [address], [place], [place] [address] [phone]

## 2021-08-19 DIAGNOSIS — I700 Atherosclerosis of aorta: Secondary | ICD-10-CM | POA: Diagnosis not present

## 2021-08-19 DIAGNOSIS — Z96652 Presence of left artificial knee joint: Secondary | ICD-10-CM | POA: Diagnosis not present

## 2021-08-19 DIAGNOSIS — M1711 Unilateral primary osteoarthritis, right knee: Secondary | ICD-10-CM | POA: Diagnosis not present

## 2021-08-19 DIAGNOSIS — G620 Drug-induced polyneuropathy: Secondary | ICD-10-CM | POA: Diagnosis not present

## 2021-08-19 DIAGNOSIS — T451X5A Adverse effect of antineoplastic and immunosuppressive drugs, initial encounter: Secondary | ICD-10-CM | POA: Diagnosis not present

## 2021-08-21 ENCOUNTER — Other Ambulatory Visit: Payer: Self-pay

## 2021-08-21 ENCOUNTER — Ambulatory Visit (INDEPENDENT_AMBULATORY_CARE_PROVIDER_SITE_OTHER): Payer: Medicare Other | Admitting: Dermatology

## 2021-08-21 DIAGNOSIS — L578 Other skin changes due to chronic exposure to nonionizing radiation: Secondary | ICD-10-CM

## 2021-08-21 DIAGNOSIS — L821 Other seborrheic keratosis: Secondary | ICD-10-CM

## 2021-08-21 DIAGNOSIS — L814 Other melanin hyperpigmentation: Secondary | ICD-10-CM | POA: Diagnosis not present

## 2021-08-21 DIAGNOSIS — D1800 Hemangioma unspecified site: Secondary | ICD-10-CM | POA: Diagnosis not present

## 2021-08-21 DIAGNOSIS — Z853 Personal history of malignant neoplasm of breast: Secondary | ICD-10-CM | POA: Diagnosis not present

## 2021-08-21 DIAGNOSIS — D229 Melanocytic nevi, unspecified: Secondary | ICD-10-CM

## 2021-08-21 DIAGNOSIS — Z1283 Encounter for screening for malignant neoplasm of skin: Secondary | ICD-10-CM

## 2021-08-21 DIAGNOSIS — L820 Inflamed seborrheic keratosis: Secondary | ICD-10-CM | POA: Diagnosis not present

## 2021-08-21 NOTE — Patient Instructions (Addendum)

## 2021-08-21 NOTE — Progress Notes (Signed)
   Follow-Up Visit   Subjective  Cassidy Norris is a 70 y.o. female who presents for the following: Total body skin exam (No hx of skin ca) and ISKs (Face, neck, f/u, LN2 11/18/). The patient presents for Total-Body Skin Exam (TBSE) for skin cancer screening and mole check.  The following portions of the chart were reviewed this encounter and updated as appropriate:   Tobacco  Allergies  Meds  Problems  Med Hx  Surg Hx  Fam Hx     Review of Systems:  No other skin or systemic complaints except as noted in HPI or Assessment and Plan.  Objective  Well appearing patient in no apparent distress; mood and affect are within normal limits.  A full examination was performed including scalp, head, eyes, ears, nose, lips, neck, chest, axillae, abdomen, back, buttocks, bilateral upper extremities, bilateral lower extremities, hands, feet, fingers, toes, fingernails, and toenails. All findings within normal limits unless otherwise noted below.  R hand x 1, face x 18, Total = 19 (19) Erythematous keratotic or waxy stuck-on papule or plaque.    Assessment & Plan   Lentigines - Scattered tan macules - Due to sun exposure - Benign-appearing, observe - Recommend daily broad spectrum sunscreen SPF 30+ to sun-exposed areas, reapply every 2 hours as needed. - Call for any changes  Seborrheic Keratoses - Stuck-on, waxy, tan-brown papules and/or plaques  - Benign-appearing - Discussed benign etiology and prognosis. - Observe - Call for any changes  Melanocytic Nevi - Tan-brown and/or pink-flesh-colored symmetric macules and papules - Benign appearing on exam today - Observation - Call clinic for new or changing moles - Recommend daily use of broad spectrum spf 30+ sunscreen to sun-exposed areas.   Hemangiomas - Red papules - Discussed benign nature - Observe - Call for any changes  Actinic Damage - Chronic condition, secondary to cumulative UV/sun exposure - diffuse scaly  erythematous macules with underlying dyspigmentation - Recommend daily broad spectrum sunscreen SPF 30+ to sun-exposed areas, reapply every 2 hours as needed.  - Staying in the shade or wearing long sleeves, sun glasses (UVA+UVB protection) and wide brim hats (4-inch brim around the entire circumference of the hat) are also recommended for sun protection.  - Call for new or changing lesions.  Skin cancer screening performed today.  History of Breast Cancer - R and L breast - no lymphadenopathy  Inflamed seborrheic keratosis R hand x 1, face x 18, Total = 19  Destruction of lesion - R hand x 1, face x 18, Total = 19 Complexity: simple   Destruction method: cryotherapy   Informed consent: discussed and consent obtained   Timeout:  patient name, date of birth, surgical site, and procedure verified Lesion destroyed using liquid nitrogen: Yes   Region frozen until ice ball extended beyond lesion: Yes   Outcome: patient tolerated procedure well with no complications   Post-procedure details: wound care instructions given    Skin cancer screening  Return in about 1 year (around 08/21/2022) for TBSE.  I, Othelia Pulling, RMA, am acting as scribe for Sarina Ser, MD . Documentation: I have reviewed the above documentation for accuracy and completeness, and I agree with the above.  Sarina Ser, MD

## 2021-08-22 ENCOUNTER — Encounter: Payer: Self-pay | Admitting: Dermatology

## 2021-08-22 ENCOUNTER — Other Ambulatory Visit: Payer: Self-pay | Admitting: Family Medicine

## 2021-08-22 DIAGNOSIS — F5101 Primary insomnia: Secondary | ICD-10-CM

## 2021-08-24 DIAGNOSIS — Z96652 Presence of left artificial knee joint: Secondary | ICD-10-CM | POA: Insufficient documentation

## 2021-09-02 ENCOUNTER — Telehealth: Payer: Self-pay | Admitting: Family Medicine

## 2021-09-02 NOTE — Telephone Encounter (Signed)
Left message for patient to call back and schedule the Medicare Annual Wellness Visit (AWV) virtually or by telephone.  Last AWV 05/09/19  Please schedule at anytime with [place].  40 minute appointment  Any questions, please call me at [phone]

## 2021-10-09 DIAGNOSIS — J301 Allergic rhinitis due to pollen: Secondary | ICD-10-CM | POA: Diagnosis not present

## 2021-10-09 DIAGNOSIS — H93293 Other abnormal auditory perceptions, bilateral: Secondary | ICD-10-CM | POA: Diagnosis not present

## 2021-10-09 DIAGNOSIS — H8113 Benign paroxysmal vertigo, bilateral: Secondary | ICD-10-CM | POA: Diagnosis not present

## 2021-10-09 DIAGNOSIS — R42 Dizziness and giddiness: Secondary | ICD-10-CM | POA: Diagnosis not present

## 2021-10-10 ENCOUNTER — Other Ambulatory Visit: Payer: Self-pay

## 2021-10-10 ENCOUNTER — Encounter: Payer: Self-pay | Admitting: Podiatry

## 2021-10-10 ENCOUNTER — Ambulatory Visit (INDEPENDENT_AMBULATORY_CARE_PROVIDER_SITE_OTHER): Payer: Medicare Other | Admitting: Podiatry

## 2021-10-10 DIAGNOSIS — S90229A Contusion of unspecified lesser toe(s) with damage to nail, initial encounter: Secondary | ICD-10-CM | POA: Diagnosis not present

## 2021-10-14 DIAGNOSIS — M25562 Pain in left knee: Secondary | ICD-10-CM | POA: Diagnosis not present

## 2021-10-15 ENCOUNTER — Other Ambulatory Visit: Payer: Self-pay

## 2021-10-15 DIAGNOSIS — E559 Vitamin D deficiency, unspecified: Secondary | ICD-10-CM

## 2021-10-15 DIAGNOSIS — E785 Hyperlipidemia, unspecified: Secondary | ICD-10-CM

## 2021-10-15 DIAGNOSIS — G35 Multiple sclerosis: Secondary | ICD-10-CM

## 2021-10-15 DIAGNOSIS — Z8639 Personal history of other endocrine, nutritional and metabolic disease: Secondary | ICD-10-CM

## 2021-10-15 DIAGNOSIS — E1169 Type 2 diabetes mellitus with other specified complication: Secondary | ICD-10-CM

## 2021-10-15 DIAGNOSIS — I10 Essential (primary) hypertension: Secondary | ICD-10-CM

## 2021-10-20 ENCOUNTER — Other Ambulatory Visit: Payer: Medicare Other

## 2021-10-20 DIAGNOSIS — Z8639 Personal history of other endocrine, nutritional and metabolic disease: Secondary | ICD-10-CM | POA: Diagnosis not present

## 2021-10-20 DIAGNOSIS — E785 Hyperlipidemia, unspecified: Secondary | ICD-10-CM | POA: Diagnosis not present

## 2021-10-20 DIAGNOSIS — E559 Vitamin D deficiency, unspecified: Secondary | ICD-10-CM | POA: Diagnosis not present

## 2021-10-20 DIAGNOSIS — E1169 Type 2 diabetes mellitus with other specified complication: Secondary | ICD-10-CM | POA: Diagnosis not present

## 2021-10-20 DIAGNOSIS — G35 Multiple sclerosis: Secondary | ICD-10-CM | POA: Diagnosis not present

## 2021-10-20 DIAGNOSIS — I10 Essential (primary) hypertension: Secondary | ICD-10-CM | POA: Diagnosis not present

## 2021-10-21 DIAGNOSIS — M25562 Pain in left knee: Secondary | ICD-10-CM | POA: Diagnosis not present

## 2021-10-21 LAB — CBC WITH DIFFERENTIAL/PLATELET
Absolute Monocytes: 656 cells/uL (ref 200–950)
Basophils Absolute: 58 cells/uL (ref 0–200)
Basophils Relative: 0.7 %
Eosinophils Absolute: 199 cells/uL (ref 15–500)
Eosinophils Relative: 2.4 %
HCT: 41.7 % (ref 35.0–45.0)
Hemoglobin: 13.7 g/dL (ref 11.7–15.5)
Lymphs Abs: 2390 cells/uL (ref 850–3900)
MCH: 31.4 pg (ref 27.0–33.0)
MCHC: 32.9 g/dL (ref 32.0–36.0)
MCV: 95.4 fL (ref 80.0–100.0)
MPV: 10.2 fL (ref 7.5–12.5)
Monocytes Relative: 7.9 %
Neutro Abs: 4997 cells/uL (ref 1500–7800)
Neutrophils Relative %: 60.2 %
Platelets: 249 10*3/uL (ref 140–400)
RBC: 4.37 10*6/uL (ref 3.80–5.10)
RDW: 12.2 % (ref 11.0–15.0)
Total Lymphocyte: 28.8 %
WBC: 8.3 10*3/uL (ref 3.8–10.8)

## 2021-10-21 LAB — COMPLETE METABOLIC PANEL WITH GFR
AG Ratio: 1.4 (calc) (ref 1.0–2.5)
ALT: 21 U/L (ref 6–29)
AST: 21 U/L (ref 10–35)
Albumin: 4.3 g/dL (ref 3.6–5.1)
Alkaline phosphatase (APISO): 63 U/L (ref 37–153)
BUN: 13 mg/dL (ref 7–25)
CO2: 28 mmol/L (ref 20–32)
Calcium: 10.6 mg/dL — ABNORMAL HIGH (ref 8.6–10.4)
Chloride: 104 mmol/L (ref 98–110)
Creat: 0.85 mg/dL (ref 0.50–1.05)
Globulin: 3 g/dL (calc) (ref 1.9–3.7)
Glucose, Bld: 112 mg/dL — ABNORMAL HIGH (ref 65–99)
Potassium: 4.5 mmol/L (ref 3.5–5.3)
Sodium: 142 mmol/L (ref 135–146)
Total Bilirubin: 0.7 mg/dL (ref 0.2–1.2)
Total Protein: 7.3 g/dL (ref 6.1–8.1)
eGFR: 74 mL/min/{1.73_m2} (ref >=60)

## 2021-10-21 LAB — LIPID PANEL
Cholesterol: 206 mg/dL — ABNORMAL HIGH (ref <200)
HDL: 41 mg/dL — ABNORMAL LOW (ref >=50)
LDL Cholesterol (Calc): 124 mg/dL (calc) — ABNORMAL HIGH
Non-HDL Cholesterol (Calc): 165 mg/dL (calc) — ABNORMAL HIGH (ref <130)
Total CHOL/HDL Ratio: 5 (calc) — ABNORMAL HIGH (ref <5.0)
Triglycerides: 269 mg/dL — ABNORMAL HIGH (ref <150)

## 2021-10-21 LAB — HEMOGLOBIN A1C
Hgb A1c MFr Bld: 6.6 % of total Hgb — ABNORMAL HIGH (ref <5.7)
Mean Plasma Glucose: 143 mg/dL
eAG (mmol/L): 7.9 mmol/L

## 2021-10-21 LAB — VITAMIN D 25 HYDROXY (VIT D DEFICIENCY, FRACTURES): Vit D, 25-Hydroxy: 64 ng/mL (ref 30–100)

## 2021-10-21 LAB — TSH: TSH: 1.06 mIU/L (ref 0.40–4.50)

## 2021-10-21 LAB — VITAMIN B12: Vitamin B-12: >2000 pg/mL — ABNORMAL HIGH (ref 200–1100)

## 2021-10-21 NOTE — Progress Notes (Signed)
   HPI: 70 y.o. female presenting today for concern of discoloration to the right great toenail.  Patient states that the left great toenail is actually starting to do the same thing.  She denies a history of injury.  She believes she possibly has toenail fungus.  This only occurred about 1 month ago.  She believes that she may be wear certain pair shoes that cause discoloration.  She presents for further treatment and evaluation  Past Medical History:  Diagnosis Date   Anemia    Arthritis not really diagnosis but have pain   B12 deficiency 07/23/2016   Will check levels to determine if injections are needed again. Await results. Recent CBC at Onc WNL   Blood transfusion without reported diagnosis 1983 had a miscarriage/dnc   Cancer ([place]) 12/30/2013   Multifocal disease: pT1c,m, N0(isolated tumor cells) Er/ PR positive, Her 2 negative.   Cancer ([place]) 01/09/2014   Invasive lobular carcinoma, 2.2 cm, T2,N1 ER/PR positive, HER-2/neu negative   Cataract 2014?   Diffuse cystic mastopathy    Essential hypertension, benign 09/24/2017   Family history of malignant neoplasm of gastrointestinal tract 2012   Fractured elbow 08/28/14   Gastroesophageal reflux disease    Hx of metabolic acidosis with increased anion gap    Increased heart rate    Multiple sclerosis (HCC)    Neuromuscular disorder (HCC)    neuropathy in bil feet - left is worse.   Obesity, unspecified    Peripheral neuropathy    Personal history of tobacco use, presenting hazards to health    Restless leg syndrome    Sleep apnea    uses CPAP   Special screening for malignant neoplasms, colon      Physical Exam: General: The patient is alert and oriented x3 in no acute distress.  Dermatology: Skin is warm, dry and supple bilateral lower extremities. Negative for open lesions or macerations.  There is some slight discoloration to the bilateral nail plates of the great toes consistent with a dry stable subungual hematoma covering  less than 25% of the nail plate.  The nail plate is actually well adhered and there is no pain with palpation.  Vascular: Palpable pedal pulses bilaterally. No edema or erythema noted. Capillary refill within normal limits.  Neurological: Epicritic and protective threshold grossly intact bilaterally.   Musculoskeletal Exam: No pedal deformity noted  Assessment: 1.  Dry stable subungual hematoma bilateral great toes   Plan of Care:  1. Patient evaluated.   2.  Explained to the patient that for now we are simply going to observe.  The subungual hematoma should grow out with time.  Recommend good supportive shoes that allow plenty of room in the toebox area 3.  Return to clinic as needed       Edrick Kins, DPM [facility]  Dr. Edrick Kins, DPM    [address], [place] [address]                Office [phone]  Fax [phone]

## 2021-10-24 DIAGNOSIS — M25562 Pain in left knee: Secondary | ICD-10-CM | POA: Diagnosis not present

## 2021-10-27 ENCOUNTER — Encounter: Payer: Self-pay | Admitting: Family Medicine

## 2021-10-27 ENCOUNTER — Ambulatory Visit (INDEPENDENT_AMBULATORY_CARE_PROVIDER_SITE_OTHER): Payer: Medicare Other | Admitting: Family Medicine

## 2021-10-27 ENCOUNTER — Other Ambulatory Visit: Payer: Self-pay

## 2021-10-27 ENCOUNTER — Encounter: Payer: Self-pay | Admitting: Neurology

## 2021-10-27 VITALS — BP 121/70 | HR 94 | Ht 65.0 in | Wt 203.0 lb

## 2021-10-27 DIAGNOSIS — G35 Multiple sclerosis: Secondary | ICD-10-CM | POA: Diagnosis not present

## 2021-10-27 DIAGNOSIS — M170 Bilateral primary osteoarthritis of knee: Secondary | ICD-10-CM | POA: Diagnosis not present

## 2021-10-27 DIAGNOSIS — G620 Drug-induced polyneuropathy: Secondary | ICD-10-CM | POA: Diagnosis not present

## 2021-10-27 DIAGNOSIS — J432 Centrilobular emphysema: Secondary | ICD-10-CM

## 2021-10-27 DIAGNOSIS — I700 Atherosclerosis of aorta: Secondary | ICD-10-CM | POA: Diagnosis not present

## 2021-10-27 DIAGNOSIS — E785 Hyperlipidemia, unspecified: Secondary | ICD-10-CM

## 2021-10-27 DIAGNOSIS — E1169 Type 2 diabetes mellitus with other specified complication: Secondary | ICD-10-CM | POA: Diagnosis not present

## 2021-10-27 DIAGNOSIS — Z1211 Encounter for screening for malignant neoplasm of colon: Secondary | ICD-10-CM | POA: Diagnosis not present

## 2021-10-27 DIAGNOSIS — F5101 Primary insomnia: Secondary | ICD-10-CM | POA: Diagnosis not present

## 2021-10-27 DIAGNOSIS — I10 Essential (primary) hypertension: Secondary | ICD-10-CM | POA: Diagnosis not present

## 2021-10-27 DIAGNOSIS — G720 Drug-induced myopathy: Secondary | ICD-10-CM | POA: Diagnosis not present

## 2021-10-27 DIAGNOSIS — Z0000 Encounter for general adult medical examination without abnormal findings: Secondary | ICD-10-CM

## 2021-10-27 DIAGNOSIS — K5900 Constipation, unspecified: Secondary | ICD-10-CM

## 2021-10-27 DIAGNOSIS — Z122 Encounter for screening for malignant neoplasm of respiratory organs: Secondary | ICD-10-CM

## 2021-10-27 DIAGNOSIS — T451X5A Adverse effect of antineoplastic and immunosuppressive drugs, initial encounter: Secondary | ICD-10-CM

## 2021-10-27 HISTORY — DX: Centrilobular emphysema: J43.2

## 2021-10-27 MED ORDER — TRAZODONE HCL 100 MG PO TABS: 100.0000 mg | ORAL_TABLET | Freq: Every day | ORAL | 3 refills | Status: DC

## 2021-10-27 MED ORDER — VENLAFAXINE HCL ER 75 MG PO CP24
75.0000 mg | ORAL_CAPSULE | Freq: Every day | ORAL | 0 refills | Status: DC
Start: 2021-10-27 — End: 2022-10-29

## 2021-10-27 MED ORDER — PREGABALIN 25 MG PO CAPS: 25.0000 mg | ORAL_CAPSULE | Freq: Two times a day (BID) | ORAL | 2 refills | Status: DC

## 2021-10-27 MED ORDER — BACLOFEN 10 MG PO TABS: 10.0000 mg | ORAL_TABLET | Freq: Three times a day (TID) | ORAL | 1 refills | Status: DC | PRN

## 2021-10-27 NOTE — Patient Instructions (Addendum)
Thank you for coming to the office today.  Start Lyrica Pregabalin 25mg  twice a day for nerve pain, neuropathy.  Refill Venlafaxine to [facility] pills  Refilled Trazodone.  Referral to [facility] for Colonoscopy  Referral to [facility] for Lung CA Screening Scan.  [facility] [address], [place], [place] Phone: [phone]  --------------------------------------  We will look into [facility] / Dr Gary Fleet office to find out if they can schedule you, since previous referral was unsuccessful.  Please schedule a Follow-up Appointment to: Return in about 6 months (around 04/27/2022) for 6 month DM A1c, HTN, HLD.  If you have any other questions or concerns, please feel free to call the office or send a message through [place]. You may also schedule an earlier appointment if necessary.  Additionally, you may be receiving a survey about your experience at our office within a few days to 1 week by e-mail or mail. We value your feedback.  Nobie Putnam, DO [place]

## 2021-10-27 NOTE — Telephone Encounter (Signed)
Pt request refill for Baclofen.  Refill sent to last until her appt in February.

## 2021-10-27 NOTE — Progress Notes (Signed)
Subjective:    Patient ID: Cassidy Norris, female    DOB: [birth year], 70 y.o.   MRN: [id number]  Cassidy Norris is a 70 y.o. female presenting on 10/27/2021 for Diabetes   HPI  CHRONIC DM, Type 2: Reports A1c down to 6.6 improved CBG Meds: Metformin 500 BID Reports good compliance. Tolerating well w/o side-effects Currently on ACEi Lifestyle: - Diet (admits starches were increasing her sugars now improving)  Denies hypoglycemia, polyuria, visual changes, numbness or tingling.  HYPERLIPIDEMIA: - Reports no concerns. Last lipid panel 09/2021, elevated overall but improved LDL On Krill [employer], not on statin due to myopathy   Constipation Due for Colonoscopy, last 2016 [facility].   Chemotherapy Induced Neuropathy Chronic problem with nerve pain Previous treatment not effective  Centrilobular Emphysema Chronic issue,  Chest discomfort - R sided chest over port with scar tissue. Due for yearly CT Lung Screening She is overdue and called but not scheduled.  Hx Breast Cancer followed by Dr Grayland Ormond history of double mastectomy breast cancer, surgery, 2015, has had some atypical chest pain and R sided rib pain at times, questioning if related to her surgery. No evidence of cardiac cause. She is requesting to return to Oncology for surveillance and concern about recurrence with her new pain. Referred 04/23/2021 - but never scheduled now 6 months later.  Osteoarthritis Bilateral knees  Health Maintenance:  Referral to [facility] for repeat Colonoscopy last 2016.  Depression screen [facility] 2/9 10/27/2021 10/21/2020 04/19/2020  Decreased Interest 0 0 0  Down, Depressed, Hopeless 0 0 0  PHQ - 2 Score 0 0 0  Altered sleeping 3 - -  Tired, decreased energy 2 - -  Change in appetite 1 - -  Feeling bad or failure about yourself  0 - -  Trouble concentrating 0 - -  Moving slowly or fidgety/restless 0 - -  Suicidal thoughts 0 - -  PHQ-9 Score 6 - -  Difficult doing  work/chores Not difficult at all - -  Some recent data might be hidden    Past Medical History:  Diagnosis Date   Anemia    Arthritis not really diagnosis but have pain   B12 deficiency 07/23/2016   Will check levels to determine if injections are needed again. Await results. Recent CBC at Onc WNL   Blood transfusion without reported diagnosis 1983 had a miscarriage/dnc   Cancer ([place]) 12/30/2013   Multifocal disease: pT1c,m, N0(isolated tumor cells) Er/ PR positive, Her 2 negative.   Cancer ([place]) 01/09/2014   Invasive lobular carcinoma, 2.2 cm, T2,N1 ER/PR positive, HER-2/neu negative   Cataract 2014?   Centrilobular emphysema ([place]) 10/27/2021   Diffuse cystic mastopathy    Essential hypertension, benign 09/24/2017   Family history of malignant neoplasm of gastrointestinal tract 2012   Fractured elbow 08/28/14   Gastroesophageal reflux disease    Hx of metabolic acidosis with increased anion gap    Increased heart rate    Multiple sclerosis (HCC)    Neuromuscular disorder (HCC)    neuropathy in bil feet - left is worse.   Obesity, unspecified    Peripheral neuropathy    Personal history of tobacco use, presenting hazards to health    Restless leg syndrome    Sleep apnea    uses CPAP   Special screening for malignant neoplasms, colon    Past Surgical History:  Procedure Laterality Date   BREAST BIOPSY Right 1973,2001   BREAST BIOPSY Right 11-07-13   INVASIVE MAMMARY CARCINOMA ,  ER/PR positive Her2 negative   BREAST BIOPSY Left 11-27-13   invasive lobular and DCIS   BREAST SURGERY Bilateral 01/09/14   mastectomy   CHOLECYSTECTOMY     COLONOSCOPY  2010   Dr. Jamal Collin   COLONOSCOPY WITH PROPOFOL N/A 06/11/2015   Procedure: COLONOSCOPY WITH PROPOFOL;  Surgeon: Christene Lye, MD;  Location: [facility] END[facility]COPY;  Service: Endoscopy;  Laterality: N/A;   DILATATION & CURETTAGE/HYSTEROSCOPY WITH MYOSURE N/A 07/12/2018   Procedure: DILATATION & CURETTAGE/HYSTEROSCOPY WITH MYOSURE WITH  ENDOMETRIAL POLYPECTOMY;  Surgeon: Will Bonnet, MD;  Location: [facility] ORS;  Service: Gynecology;  Laterality: N/A;   DILATION AND CURETTAGE OF UTERUS  1983   JOINT REPLACEMENT  11/23/2015   POLYPECTOMY  1989   PORTA CATH REMOVAL     PORTACATH PLACEMENT  2015   SPINE SURGERY  09/14/14   Ruptured disk L4- L5   TOTAL KNEE ARTHROPLASTY Left 11/22/2015   Procedure: TOTAL KNEE ARTHROPLASTY;  Surgeon: Earlie Server, MD;  Location: [facility];  Service: Orthopedics;  Laterality: Left;   TUBAL LIGATION     WISDOM TOOTH EXTRACTION  2006   Social History   Socioeconomic History   Marital status: Widowed    Spouse name: Not on file   Number of children: Not on file   Years of education: some college   Highest education level: High school graduate  Occupational History   Occupation: retired  Tobacco Use   Smoking status: Former    Packs/day: 1.00    Years: 30.00    Pack years: 30.00    Types: Cigarettes    Quit date: 12/11/2013    Years since quitting: 7.8   Smokeless tobacco: Former  Scientific laboratory technician Use: Never used  Substance and Sexual Activity   Alcohol use: No    Alcohol/week: 0.0 standard drinks    Comment: social   Drug use: No   Sexual activity: Not Currently    Birth control/protection: Post-menopausal  Other Topics Concern   Not on file  Social History Narrative   Not on file   Social Determinants of Health   Financial Resource Strain: Not on file  Food Insecurity: Not on file  Transportation Needs: Not on file  Physical Activity: Not on file  Stress: Not on file  Social Connections: Not on file  Intimate Partner Violence: Not on file   Family History  Problem Relation Age of Onset   Cancer Mother        lung   COPD Mother    Cancer Father        colon, stomach   Diabetes Maternal Grandmother    Hypertension Sister    Rectal cancer Paternal Uncle    Rectal cancer Paternal Uncle    Current Outpatient Medications on File Prior to Visit  Medication  Sig   acetaminophen (TYLENOL) 500 MG tablet Take 1,000 mg by mouth 2 (two) times daily as needed for moderate pain or headache.   calcium carbonate (OSCAL) 1500 (600 Ca) MG TABS tablet Take by mouth 2 (two) times daily with a meal.   diclofenac Sodium (VOLTAREN) 1 % GEL Apply 2 g topically 3 (three) times daily as needed.   famotidine (PEPCID) 20 MG tablet TAKE 1 TABLET BY MOUTH TWICE A DAY BEFORE MEALS   fluticasone (FLONASE) 50 MCG/ACT nasal spray PLACE 2 SPRAYS INTO BOTH NOSTRILS AT BEDTIME.   Krill Oil 1000 MG CAPS Take by mouth.   lisinopril (ZESTRIL) 5 MG tablet TAKE 1 TABLET BY  MOUTH EVERY EVENING   Melatonin 10 MG CAPS Take by mouth.   metFORMIN (GLUCOPHAGE) 500 MG tablet Take 1 tablet (500 mg total) by mouth 2 (two) times daily with a meal.   polyethylene glycol powder (GLYCOLAX/MIRALAX) powder Take 17 g by mouth 2 (two) times daily as needed for moderate constipation.   vitamin B-12 (CYANOCOBALAMIN) 1000 MCG tablet Take 2,500 mcg by mouth daily.   Vitamin D, Cholecalciferol, 50 MCG (2000 UT) CAPS Take 1 capsule by mouth every 3 (three) days.   No current facility-administered medications on file prior to visit.    Review of Systems  Constitutional:  Negative for activity change, appetite change, chills, diaphoresis, fatigue and fever.  HENT:  Negative for congestion and hearing loss.   Eyes:  Negative for visual disturbance.  Respiratory:  Negative for cough, chest tightness, shortness of breath and wheezing.   Cardiovascular:  Negative for chest pain, palpitations and leg swelling.  Gastrointestinal:  Negative for abdominal pain, constipation, diarrhea, nausea and vomiting.  Genitourinary:  Negative for dysuria, frequency and hematuria.  Musculoskeletal:  Negative for arthralgias and neck pain.  Skin:  Negative for rash.  Neurological:  Negative for dizziness, weakness, light-headedness, numbness and headaches.  Hematological:  Negative for adenopathy.  Psychiatric/Behavioral:   Negative for behavioral problems, dysphoric mood and sleep disturbance.   Per HPI unless specifically indicated above      Objective:    BP 121/70   Pulse 94   Ht 5' 5" (1.651 m)   Wt 203 lb (92.1 kg)   LMP 12/22/1999 (Approximate)   SpO2 100%   BMI 33.78 kg/m   Wt Readings from Last 3 Encounters:  10/27/21 203 lb (92.1 kg)  07/31/21 208 lb (94.3 kg)  04/30/21 209 lb (94.8 kg)    Physical Exam Vitals and nursing note reviewed.  Constitutional:      General: She is not in acute distress.    Appearance: She is well-developed. She is not diaphoretic.     Comments: Well-appearing, comfortable, cooperative  HENT:     Head: Normocephalic and atraumatic.  Eyes:     General:        Right eye: No discharge.        Left eye: No discharge.     Conjunctiva/sclera: Conjunctivae normal.     Pupils: Pupils are equal, round, and reactive to light.  Neck:     Thyroid: No thyromegaly.  Cardiovascular:     Rate and Rhythm: Normal rate and regular rhythm.     Pulses: Normal pulses.     Heart sounds: Normal heart sounds. No murmur heard. Pulmonary:     Effort: Pulmonary effort is normal. No respiratory distress.     Breath sounds: Normal breath sounds. No wheezing or rales.  Abdominal:     General: Bowel sounds are normal. There is no distension.     Palpations: Abdomen is soft. There is no mass.     Tenderness: There is no abdominal tenderness.  Musculoskeletal:        General: No tenderness. Normal range of motion.     Cervical back: Normal range of motion and neck supple.     Comments: Upper / Lower Extremities: - Normal muscle tone, strength bilateral upper extremities 5/5, lower extremities 5/5  Lymphadenopathy:     Cervical: No cervical adenopathy.  Skin:    General: Skin is warm and dry.     Findings: No erythema or rash.  Neurological:     Mental Status: She  is alert and oriented to person, place, and time.     Comments: Distal sensation intact to light touch all  extremities  Psychiatric:        Mood and Affect: Mood normal.        Behavior: Behavior normal.        Thought Content: Thought content normal.     Comments: Well groomed, good eye contact, normal speech and thoughts    I have personally reviewed the radiology report from 06/2020 LDCT.  Narrative & Impression  CLINICAL DATA:  70 year old female with 30 pack-year history of smoking. Lung cancer screening.   EXAM: CT CHEST WITHOUT CONTRAST LOW-DOSE FOR LUNG CANCER SCREENING   TECHNIQUE: Multidetector CT imaging of the chest was performed following the standard protocol without IV contrast.   COMPARISON:  05/23/2019   FINDINGS: Cardiovascular: The heart size is normal. No substantial pericardial effusion. Atherosclerotic calcification is noted in the wall of the thoracic aorta.   Mediastinum/Nodes: No mediastinal lymphadenopathy. No evidence for gross hilar lymphadenopathy although assessment is limited by the lack of intravenous contrast on today's study. The esophagus has normal imaging features. There is no axillary lymphadenopathy.   Lungs/Pleura: Centrilobular emphsyema noted. Several tiny bilateral pulmonary nodules are identified measuring up to maximum 1-2 mm volume derived equivalent diameter. Calcified granuloma evident left lower lobe. No suspicious pulmonary nodule or mass. No focal airspace consolidation. No pleural effusion.   Upper Abdomen: The liver shows diffusely decreased attenuation suggesting fat deposition. 3.3 cm exophytic lesion upper pole left kidney is stable, likely a cyst. 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   Musculoskeletal: No worrisome lytic or sclerotic osseous abnormality. Sclerotic lesion in the T7 vertebral body is stable, likely a bone island. Tiny sclerotic focus in the T10 vertebral body also unchanged.   IMPRESSION: 1. Lung-RADS 2, benign appearance or behavior. Continue annual screening  with low-dose chest CT without contrast in 12 months. 2. Hepatic steatosis. 3. 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 4. Aortic Atherosclerosis (ICD10-I70.0) and Emphysema (ICD10-J43.9).     Electronically Signed   By: Misty Stanley M.D.   On: 07/02/2020 11:17    Results for orders placed or performed in visit on 10/15/21  Vitamin B12  Result Value Ref Range   Vitamin B-12 >2,000 (H) 200 - 1,100 pg/mL  VITAMIN D 25 Hydroxy (Vit-D Deficiency, Fractures)  Result Value Ref Range   Vit D, 25-Hydroxy 64 30 - 100 ng/mL  TSH  Result Value Ref Range   TSH 1.06 0.40 - 4.50 mIU/L  Lipid panel  Result Value Ref Range   Cholesterol 206 (H) <200 mg/dL   HDL 41 (L) > OR = 50 mg/dL   Triglycerides 269 (H) <150 mg/dL   LDL Cholesterol (Calc) 124 (H) mg/dL (calc)   Total CHOL/HDL Ratio 5.0 (H) <5.0 (calc)   Non-HDL Cholesterol (Calc) 165 (H) <130 mg/dL (calc)  COMPLETE METABOLIC PANEL WITH GFR  Result Value Ref Range   Glucose, Bld 112 (H) 65 - 99 mg/dL   BUN 13 7 - 25 mg/dL   Creat 0.85 0.50 - 1.05 mg/dL   eGFR 74 > OR = 60 mL/min/1.70m   BUN/Creatinine Ratio NOT APPLICABLE 6 - 22 (calc)   Sodium 142 135 - 146 mmol/L   Potassium 4.5 3.5 - 5.3 mmol/L   Chloride 104 98 - 110 mmol/L   CO2 28 20 - 32 mmol/L   Calcium 10.6 (H) 8.6 -  10.4 mg/dL   Total Protein 7.3 6.1 - 8.1 g/dL   Albumin 4.3 3.6 - 5.1 g/dL   Globulin 3.0 1.9 - 3.7 g/dL (calc)   AG Ratio 1.4 1.0 - 2.5 (calc)   Total Bilirubin 0.7 0.2 - 1.2 mg/dL   Alkaline phosphatase (APISO) 63 37 - 153 U/L   AST 21 10 - 35 U/L   ALT 21 6 - 29 U/L  CBC with Differential/Platelet  Result Value Ref Range   WBC 8.3 3.8 - 10.8 Thousand/uL   RBC 4.37 3.80 - 5.10 Million/uL   Hemoglobin 13.7 11.7 - 15.5 g/dL   HCT 41.7 35.0 - 45.0 %   MCV 95.4 80.0 - 100.0 fL   MCH 31.4 27.0 - 33.0 pg   MCHC 32.9 32.0 - 36.0 g/dL   RDW 12.2 11.0 - 15.0 %   Platelets 249 140 - 400 Thousand/uL   MPV 10.2 7.5 - 12.5 fL    Neutro Abs 4,997 1,500 - 7,800 cells/uL   Lymphs Abs 2,390 850 - 3,900 cells/uL   Absolute Monocytes 656 200 - 950 cells/uL   Eosinophils Absolute 199 15 - 500 cells/uL   Basophils Absolute 58 0 - 200 cells/uL   Neutrophils Relative % 60.2 %   Total Lymphocyte 28.8 %   Monocytes Relative 7.9 %   Eosinophils Relative 2.4 %   Basophils Relative 0.7 %  Hemoglobin A1c  Result Value Ref Range   Hgb A1c MFr Bld 6.6 (H) <5.7 % of total Hgb   Mean Plasma Glucose 143 mg/dL   eAG (mmol/L) 7.9 mmol/L      Assessment & Plan:   Problem List Items Addressed This Visit     Type 2 diabetes mellitus with other specified complication (HCC)   Peripheral neuropathy due to chemotherapy (HCC)   Relevant Medications   pregabalin (LYRICA) 25 MG capsule   traZODone (DESYREL) 100 MG tablet   venlafaxine XR (EFFEXOR-XR) 75 MG 24 hr capsule   Osteoarthritis of knee   Multiple sclerosis (HCC)   Insomnia   Relevant Medications   traZODone (DESYREL) 100 MG tablet   venlafaxine XR (EFFEXOR-XR) 75 MG 24 hr capsule   Hyperlipidemia associated with type 2 diabetes mellitus (HCC)   Essential hypertension, benign   Drug-induced myopathy   Centrilobular emphysema (HCC)   Relevant Orders   Ambulatory Referral Lung Cancer Screening [facility]   Aortic atherosclerosis ([place])   Other Visit Diagnoses     Annual physical exam    -  Primary   Screening for colon cancer       Relevant Orders   Ambulatory referral to General Surgery   Screening for lung cancer       Relevant Orders   Ambulatory Referral Lung Cancer Screening [facility]   Constipation, unspecified constipation type       Relevant Orders   Ambulatory referral to General Surgery       Updated Health Maintenance information Reviewed recent lab results with patient Encouraged improvement to lifestyle with diet and exercise Goal of weight loss  Overdue for LDCT, will refer to program, since switched to [employer] now, she  did not get scheduled yet.  Colon CA Screening - referral to Gen Surgery ASA same office as prior colonoscopy. Also with some constipation.  Aortic Atherosclerosis Last imaging CT 2021 demonstrated.  MS Followed by Neurology  HLD Cannot tolerate statin Has drug induced myopathy  Hx Breast Cancer Previous referral 04/2021 unsuccessful, she has tried to call to  schedule, I have contacted our referral coordinator and they will work on getting her scheduled now.  Refill Trazodone, insomnia.  Neuropathic pain chronic, secondary to chemotherapy Trial on Lyrica 69m BID, can adjust dose accordingly.  Type 2 DM A1c controlled 6.6 Continue current course / medications  Orders Placed This Encounter  Procedures   Ambulatory referral to General Surgery    Referral Priority:   Routine    Referral Type:   Surgical    Referral Reason:   Specialty Services Required    Requested Specialty:   General Surgery    Number of Visits Requested:   1   Ambulatory Referral Lung Cancer Screening [facility]    Referral Priority:   Routine    Referral Type:   Consultation    Referral Reason:   Specialty Services Required    Number of Visits Requested:   1      Meds ordered this encounter  Medications   pregabalin (LYRICA) 25 MG capsule    Sig: Take 1 capsule (25 mg total) by mouth 2 (two) times daily.    Dispense:  60 capsule    Refill:  2   traZODone (DESYREL) 100 MG tablet    Sig: Take 1 tablet (100 mg total) by mouth at bedtime.    Dispense:  90 tablet    Refill:  3   venlafaxine XR (EFFEXOR-XR) 75 MG 24 hr capsule    Sig: Take 1 capsule (75 mg total) by mouth daily with breakfast.    Dispense:  360 capsule    Refill:  0      Follow up plan: Return in about 6 months (around 04/27/2022) for 6 month DM A1c, HTN, HLD.  ANobie Putnam DO [facility] 10/27/2021, 9:00 AM

## 2021-10-28 DIAGNOSIS — M25562 Pain in left knee: Secondary | ICD-10-CM | POA: Diagnosis not present

## 2021-10-30 DIAGNOSIS — M25562 Pain in left knee: Secondary | ICD-10-CM | POA: Diagnosis not present

## 2021-11-04 DIAGNOSIS — M25562 Pain in left knee: Secondary | ICD-10-CM | POA: Diagnosis not present

## 2021-11-05 ENCOUNTER — Inpatient Hospital Stay: Payer: Medicare Other | Attending: Oncology | Admitting: Oncology

## 2021-11-05 ENCOUNTER — Other Ambulatory Visit: Payer: Self-pay

## 2021-11-05 VITALS — BP 114/75 | HR 95 | Temp 98.1°F | Resp 16 | Wt 205.2 lb

## 2021-11-05 DIAGNOSIS — Z122 Encounter for screening for malignant neoplasm of respiratory organs: Secondary | ICD-10-CM

## 2021-11-05 DIAGNOSIS — Z853 Personal history of malignant neoplasm of breast: Secondary | ICD-10-CM | POA: Diagnosis not present

## 2021-11-05 DIAGNOSIS — R911 Solitary pulmonary nodule: Secondary | ICD-10-CM

## 2021-11-05 DIAGNOSIS — R918 Other nonspecific abnormal finding of lung field: Secondary | ICD-10-CM | POA: Diagnosis not present

## 2021-11-05 DIAGNOSIS — Z79899 Other long term (current) drug therapy: Secondary | ICD-10-CM | POA: Diagnosis not present

## 2021-11-05 DIAGNOSIS — G35 Multiple sclerosis: Secondary | ICD-10-CM | POA: Diagnosis not present

## 2021-11-05 DIAGNOSIS — Z170 Estrogen receptor positive status [ER+]: Secondary | ICD-10-CM | POA: Insufficient documentation

## 2021-11-05 DIAGNOSIS — Z9013 Acquired absence of bilateral breasts and nipples: Secondary | ICD-10-CM | POA: Insufficient documentation

## 2021-11-05 DIAGNOSIS — Z9221 Personal history of antineoplastic chemotherapy: Secondary | ICD-10-CM | POA: Insufficient documentation

## 2021-11-05 NOTE — Progress Notes (Signed)
[place]  [phone]  Fax:([phone]) [place] DOB: [birth year]  MR#: [id number]  ZLD#:[id number]  Patient Care Team: [name], DO as PCP - General (Family Medicine) Christene Lye, MD (General Surgery) Forest Gleason, MD (Oncology) Pieter Partridge, DO as Consulting Physician (Neurology)  CHIEF COMPLAINT: Bilateral adenocarcinoma of the breast.  INTERVAL HISTORY: Patient last seen in clinic in September 2020.  She returns to clinic today with multiple medical complaints and concern over incidental pulmonary nodules seen on lung screening CT 1 year ago.  She has no new neurologic complaints.  She denies any recent fevers or illnesses. She has a good appetite and denies weight loss.  She has no chest pain, shortness of breath, cough, or hemoptysis.  She denies any nausea, vomiting, constipation, or diarrhea. She has no urinary complaints.  Patient offers no further specific complaints today.  REVIEW OF SYSTEMS:   Review of Systems  Constitutional: Negative.  Negative for fever, malaise/fatigue and weight loss.  Respiratory: Negative.  Negative for cough, hemoptysis and shortness of breath.   Cardiovascular: Negative.  Negative for chest pain and leg swelling.  Gastrointestinal: Negative.  Negative for abdominal pain.  Genitourinary: Negative.  Negative for dysuria.  Musculoskeletal:  Positive for joint pain. Negative for myalgias.  Skin: Negative.  Negative for rash.  Neurological: Negative.  Negative for dizziness, sensory change, focal weakness and weakness.  Psychiatric/Behavioral:  The patient is nervous/anxious.    As per HPI. Otherwise, a complete review of systems is negative.  ONCOLOGY HISTORY: Oncology History Overview Note  1. Bilateral carcinoma of breast, February, 2015. Right breast status post radical mastectomy. T1 C. N0M0 (isolated tumor cells in one lymph node) multifocal invasive cancer. Stage IC, Left  breast, Status post radical mastectomy, T2, N1, M0 tumor. Stage II, RIght breast. Both tumors are estrogen receptor positive.  Progesterone receptor positive.  HER-2 receptor negative by FISH. Negative for BRCA mutation.  2. Started on Adriamycin and Cytoxan on March 01, 2014. Last cycle of Cytoxan and Adriamycin on May 01, 2014. Patient has finished last chemotherapy on September 9. (12 th dose was omitted because of neuropathy) 3.  Taking tamoxifen.  Patient had a poor tolerance to letrozole.(October, 2015) 4.  Patient is taking letrozole since November of 2015 5.  May, 2016 Letrozole has been put on hold because of bony pains 6.  Started on Aromasin from July of 2016    History of bilateral breast cancer (Resolved)  11/29/2013 Initial Diagnosis   Breast cancer bilateral, multifocal ER/PR pos, Her 2 neg,.     PAST MEDICAL HISTORY: Past Medical History:  Diagnosis Date   Anemia    Arthritis not really diagnosis but have pain   B12 deficiency 07/23/2016   Will check levels to determine if injections are needed again. Await results. Recent CBC at [facility]c [facility]   Blood transfusion without reported diagnosis 1983 had a miscarriage/dnc   Cancer ([place]) 12/30/2013   Multifocal disease: pT1c,m, N0(isolated tumor cells) Er/ PR positive, Her 2 negative.   Cancer ([place]) 01/09/2014   Invasive lobular carcinoma, 2.2 cm, T2,N1 ER/PR positive, HER-2/neu negative   Cataract 2014?   Centrilobular emphysema ([place]) 10/27/2021   Diffuse cystic mastopathy    Essential hypertension, benign 09/24/2017   Family history of malignant neoplasm of gastrointestinal tract 2012   Fractured elbow 08/28/14   Gastroesophageal reflux disease    Hx of metabolic acidosis with increased anion gap    Increased  heart rate    Multiple sclerosis (HCC)    Neuromuscular disorder (HCC)    neuropathy in bil feet - left is worse.   Obesity, unspecified    Peripheral neuropathy    Personal history of tobacco use, presenting hazards  to health    Restless leg syndrome    Sleep apnea    uses CPAP   Special screening for malignant neoplasms, colon     PAST SURGICAL HISTORY: Past Surgical History:  Procedure Laterality Date   BREAST BIOPSY Right 1973,2001   BREAST BIOPSY Right 11-07-13   INVASIVE MAMMARY CARCINOMA , ER/PR positive Her2 negative   BREAST BIOPSY Left 11-27-13   invasive lobular and DCIS   BREAST SURGERY Bilateral 01/09/14   mastectomy   CHOLECYSTECTOMY     COLONOSCOPY  2010   Dr. Jamal Collin   COLONOSCOPY WITH PROPOFOL N/A 06/11/2015   Procedure: COLONOSCOPY WITH PROPOFOL;  Surgeon: Christene Lye, MD;  Location: [facility];  Service: Endoscopy;  Laterality: N/A;   DILATATION & CURETTAGE/HYSTEROSCOPY WITH MYOSURE N/A 07/12/2018   Procedure: DILATATION & CURETTAGE/HYSTEROSCOPY WITH MYOSURE WITH ENDOMETRIAL POLYPECTOMY;  Surgeon: Will Bonnet, MD;  Location: [facility];  Service: Gynecology;  Laterality: N/A;   DILATION AND CURETTAGE OF UTERUS  1983   JOINT REPLACEMENT  11/23/2015   POLYPECTOMY  1989   PORTA CATH REMOVAL     PORTACATH PLACEMENT  2015   SPINE SURGERY  09/14/14   Ruptured disk L4- L5   TOTAL KNEE ARTHROPLASTY Left 11/22/2015   Procedure: TOTAL KNEE ARTHROPLASTY;  Surgeon: Earlie Server, MD;  Location: [facility];  Service: Orthopedics;  Laterality: Left;   TUBAL LIGATION     WISDOM TOOTH EXTRACTION  2006    FAMILY HISTORY Family History  Problem Relation Age of Onset   Cancer Mother        lung   COPD Mother    Cancer Father        colon, stomach   Diabetes Maternal Grandmother    Hypertension Sister    Rectal cancer Paternal Uncle    Rectal cancer Paternal Uncle     GYNECOLOGIC HISTORY:  Patient's last menstrual period was 12/22/1999 (approximate).     ADVANCED DIRECTIVES:    HEALTH MAINTENANCE: Social History   Tobacco Use   Smoking status: Former    Packs/day: 1.00    Years: 30.00    Pack years: 30.00    Types: Cigarettes    Quit date: 12/11/2013     Years since quitting: 7.9   Smokeless tobacco: Former  Scientific laboratory technician Use: Never used  Substance Use Topics   Alcohol use: No    Alcohol/week: 0.0 standard drinks    Comment: social   Drug use: No     Colonoscopy:  PAP:  Bone density:  Mammogram:  No Known Allergies  Current Outpatient Medications  Medication Sig Dispense Refill   acetaminophen (TYLENOL) 500 MG tablet Take 1,000 mg by mouth 2 (two) times daily as needed for moderate pain or headache.     baclofen (LIORESAL) 10 MG tablet Take 1 tablet (10 mg total) by mouth 3 (three) times daily as needed for muscle spasms. 90 tablet 1   calcium carbonate (OSCAL) 1500 (600 Ca) MG TABS tablet Take by mouth 2 (two) times daily with a meal.     diclofenac Sodium (VOLTAREN) 1 % GEL Apply 2 g topically 3 (three) times daily as needed. 100 g 2   famotidine (PEPCID) 20 MG tablet TAKE  1 TABLET BY MOUTH TWICE A DAY BEFORE MEALS 180 tablet 0   fluticasone (FLONASE) 50 MCG/ACT nasal spray PLACE 2 SPRAYS INTO BOTH NOSTRILS AT BEDTIME. 48 g 1   Krill Oil 1000 MG CAPS Take by mouth.     lisinopril (ZESTRIL) 5 MG tablet TAKE 1 TABLET BY MOUTH EVERY EVENING 90 tablet 3   Melatonin 10 MG CAPS Take by mouth.     metFORMIN (GLUCOPHAGE) 500 MG tablet Take 1 tablet (500 mg total) by mouth 2 (two) times daily with a meal. 180 tablet 3   polyethylene glycol powder (GLYCOLAX/MIRALAX) powder Take 17 g by mouth 2 (two) times daily as needed for moderate constipation. 3350 g 1   pregabalin (LYRICA) 25 MG capsule Take 1 capsule (25 mg total) by mouth 2 (two) times daily. 60 capsule 2   traZODone (DESYREL) 100 MG tablet Take 1 tablet (100 mg total) by mouth at bedtime. 90 tablet 3   venlafaxine XR (EFFEXOR-XR) 75 MG 24 hr capsule Take 1 capsule (75 mg total) by mouth daily with breakfast. 360 capsule 0   vitamin B-12 (CYANOCOBALAMIN) 1000 MCG tablet Take 2,500 mcg by mouth daily.     Vitamin D, Cholecalciferol, 50 MCG (2000 UT) CAPS Take 1 capsule by mouth  every 3 (three) days.     No current facility-administered medications for this visit.    OBJECTIVE: BP 114/75    Pulse 95    Temp 98.1 F (36.7 C) (Oral)    Resp 16    Wt 205 lb 3.2 oz (93.1 kg)    LMP 12/22/1999 (Approximate)    SpO2 98%    BMI 34.15 kg/m    Body mass index is 34.15 kg/m.    ECOG FS:0 - Asymptomatic  General: Well-developed, well-nourished, no acute distress. Eyes: Pink conjunctiva, anicteric sclera. HEENT: Normocephalic, moist mucous membranes. Breast: Bilateral mastectomy. Lungs: No audible wheezing or coughing. Heart: Regular rate and rhythm. Abdomen: Soft, nontender, no obvious distention. Musculoskeletal: No edema, cyanosis, or clubbing. Neuro: Alert, answering all questions appropriately. Cranial nerves grossly intact. Skin: No rashes or petechiae noted. Psych: Normal affect.   LAB RESULTS:  No visits with results within 3 Day(s) from this visit.  Latest known visit with results is:  Orders Only on 10/15/2021  Component Date Value Ref Range Status   Vitamin B-12 10/20/2021 >2,000 (H)  200 - 1,100 pg/mL Final   Vit D, 25-Hydroxy 10/20/2021 64  30 - 100 ng/mL Final   Comment: Vitamin D Status         25-OH Vitamin D: . Deficiency:                    <20 ng/mL Insufficiency:             20 - 29 ng/mL Optimal:                 > or = 30 ng/mL . For 25-OH Vitamin D testing on patients on  D2-supplementation and patients for whom quantitation  of D2 and D3 fractions is required, the [facility](TM) 25-OH VIT D, (D2,D3), LC/MS/MS is recommended: order  code [id number] (patients >82yr). See Note 1 . Note 1 . For additional information, please refer to  http://education.[facility].com/faq/FAQ199  (This link is being provided for informational/ educational purposes only.)    TSH 10/20/2021 1.06  0.40 - 4.50 mIU/L Final   Cholesterol 10/20/2021 206 (H)  <200 mg/dL Final   HDL 10/20/2021 41 (L)  > OR = 50  mg/dL Final   Triglycerides 10/20/2021 269  (H)  <150 mg/dL Final   Comment: . If a non-fasting specimen was collected, consider repeat triglyceride testing on a fasting specimen if clinically indicated.  Yates Decamp et al. J. of Clin. Lipidol. 1610;9:604-540. Marland Kitchen    LDL Cholesterol (Calc) 10/20/2021 124 (H)  mg/dL (calc) Final   Comment: Reference range: <100 . Desirable range <100 mg/dL for primary prevention;   <70 mg/dL for patients with CHD or diabetic patients  with > or = 2 CHD risk factors. Marland Kitchen LDL-C is now calculated using the Martin-Hopkins  calculation, which is a validated novel method providing  better accuracy than the Friedewald equation in the  estimation of LDL-C.  Cresenciano Genre et al. Annamaria Helling. 9811;914(78): 2061-2068  (http://education.[facility].com/faq/FAQ164)    Total CHOL/HDL Ratio 10/20/2021 5.0 (H)  <5.0 (calc) Final   Non-HDL Cholesterol (Calc) 10/20/2021 165 (H)  <130 mg/dL (calc) Final   Comment: For patients with diabetes plus 1 major ASCVD risk  factor, treating to a non-HDL-C goal of <100 mg/dL  (LDL-C of <70 mg/dL) is considered a therapeutic  option.    Glucose, Bld 10/20/2021 112 (H)  65 - 99 mg/dL Final   Comment: .            Fasting reference interval . For someone without known diabetes, a glucose value between 100 and 125 mg/dL is consistent with prediabetes and should be confirmed with a follow-up test. .    BUN 10/20/2021 13  7 - 25 mg/dL Final   Creat 10/20/2021 0.85  0.50 - 1.05 mg/dL Final   eGFR 10/20/2021 74  > OR = 60 mL/min/1.66m Final   Comment: The eGFR is based on the CKD-EPI 2021 equation. To calculate  the new eGFR from a previous Creatinine or Cystatin C result, go to https://www.kidney.org/professionals/ kdoqi/gfr%5Fcalculator    BUN/Creatinine Ratio 129/56/2130NOT APPLICABLE  6 - 22 (calc) Final   Sodium 10/20/2021 142  135 - 146 mmol/L Final   Potassium 10/20/2021 4.5  3.5 - 5.3 mmol/L Final   Chloride 10/20/2021 104  98 - 110 mmol/L Final   CO2 10/20/2021 28   20 - 32 mmol/L Final   Calcium 10/20/2021 10.6 (H)  8.6 - 10.4 mg/dL Final   Total Protein 10/20/2021 7.3  6.1 - 8.1 g/dL Final   Albumin 10/20/2021 4.3  3.6 - 5.1 g/dL Final   Globulin 10/20/2021 3.0  1.9 - 3.7 g/dL (calc) Final   AG Ratio 10/20/2021 1.4  1.0 - 2.5 (calc) Final   Total Bilirubin 10/20/2021 0.7  0.2 - 1.2 mg/dL Final   Alkaline phosphatase (APISO) 10/20/2021 63  37 - 153 U/L Final   AST 10/20/2021 21  10 - 35 U/L Final   ALT 10/20/2021 21  6 - 29 U/L Final   WBC 10/20/2021 8.3  3.8 - 10.8 Thousand/uL Final   RBC 10/20/2021 4.37  3.80 - 5.10 Million/uL Final   Hemoglobin 10/20/2021 13.7  11.7 - 15.5 g/dL Final   HCT 10/20/2021 41.7  35.0 - 45.0 % Final   MCV 10/20/2021 95.4  80.0 - 100.0 fL Final   MCH 10/20/2021 31.4  27.0 - 33.0 pg Final   MCHC 10/20/2021 32.9  32.0 - 36.0 g/dL Final   RDW 10/20/2021 12.2  11.0 - 15.0 % Final   Platelets 10/20/2021 249  140 - 400 Thousand/uL Final   MPV 10/20/2021 10.2  7.5 - 12.5 fL Final   Neutro Abs 10/20/2021 4,997  1,500 - 7,800 cells/uL  Final   Lymphs Abs 10/20/2021 2,390  850 - 3,900 cells/uL Final   Absolute Monocytes 10/20/2021 656  200 - 950 cells/uL Final   Eosinophils Absolute 10/20/2021 199  15 - 500 cells/uL Final   Basophils Absolute 10/20/2021 58  0 - 200 cells/uL Final   Neutrophils Relative % 10/20/2021 60.2  % Final   Total Lymphocyte 10/20/2021 28.8  % Final   Monocytes Relative 10/20/2021 7.9  % Final   Eosinophils Relative 10/20/2021 2.4  % Final   Basophils Relative 10/20/2021 0.7  % Final   Hgb A1c MFr Bld 10/20/2021 6.6 (H)  <5.7 % of total Hgb Final   Comment: For someone without known diabetes, a hemoglobin A1c value of 6.5% or greater indicates that they may have  diabetes and this should be confirmed with a follow-up  test. . For someone with known diabetes, a value <7% indicates  that their diabetes is well controlled and a value  greater than or equal to 7% indicates suboptimal  control. A1c  targets should be individualized based on  duration of diabetes, age, comorbid conditions, and  other considerations. . Currently, no consensus exists regarding use of hemoglobin A1c for diagnosis of diabetes for children. .    Mean Plasma Glucose 10/20/2021 143  mg/dL Final   eAG (mmol/L) 10/20/2021 7.9  mmol/L Final    STUDIES: No results found.  ASSESSMENT: Bilateral adenocarcinoma of the breast (right breast, stage I, T1c N0 M0 (isolated tumor cells in 1 lymph node; left breast, stage II, T2 N1 M0.)  PLAN:    1. Bilateral adenocarcinoma of the breast: Patient is status post bilateral mastectomy in February 2015. She also received adjuvant chemotherapy with Adriamycin, Cytoxan, and Taxol completing treatment in approximately October 2015. She then initiated letrozole, but could not tolerate secondary to leg pain and was switched to Aromasin.  Patient then switched to tamoxifen in October 2019 secondary to cost.  Patient subsequently discontinued tamoxifen and did not complete the recommended 5 years of treatment.  2. Postmenopausal: Patient's last bone mineral density was on August 01, 2016 and revealed a T-score of -1.2. Continue monitoring bone mineral density by PCP. 3.  Multiple sclerosis: Chronic.  Continue evaluation and treatment per neurology. 4.  Pulmonary nodules: Likely benign.  Repeat CT scan in 4 weeks and then return to clinic 1 to 2 days later to discuss the results.  I spent a total of 20 minutes reviewing chart data, face-to-face evaluation with the patient, counseling and coordination of care as detailed above.     Patient expressed understanding and was in agreement with this plan. She also understands that She can call clinic at any time with any questions, concerns, or complaints.   Breast cancer bilateral, multifocal ER/PR pos, Her 2 neg,.   Staging form: Breast, AJCC 7th Edition     Clinical: Stage IIB (T2, N1, M0) - Signed by Forest Gleason, MD on  04/22/2015   Lloyd Huger, MD   11/06/2021 5:39 AM

## 2021-11-05 NOTE — Progress Notes (Signed)
Pain across top of chest, down sides under arms and across shoulders. Denies palpitations or shortness of breath. States this does not feel like heart pain. Pt states it feels like muscle pain. Pt reports emphysema per chest ct and "spot in lung." Pt also endorses chronic b/l knee pain. P/o dry intermittent cough usually at night.

## 2021-11-07 DIAGNOSIS — M25562 Pain in left knee: Secondary | ICD-10-CM | POA: Diagnosis not present

## 2021-11-11 DIAGNOSIS — M25562 Pain in left knee: Secondary | ICD-10-CM | POA: Diagnosis not present

## 2021-11-14 ENCOUNTER — Other Ambulatory Visit: Payer: Self-pay

## 2021-11-14 ENCOUNTER — Encounter: Payer: Self-pay | Admitting: Gastroenterology

## 2021-11-14 ENCOUNTER — Ambulatory Visit (INDEPENDENT_AMBULATORY_CARE_PROVIDER_SITE_OTHER): Payer: Medicare Other | Admitting: Gastroenterology

## 2021-11-14 VITALS — BP 115/68 | HR 130 | Temp 98.1°F | Ht 65.0 in | Wt 205.0 lb

## 2021-11-14 DIAGNOSIS — K5904 Chronic idiopathic constipation: Secondary | ICD-10-CM | POA: Diagnosis not present

## 2021-11-14 DIAGNOSIS — Z86010 Personal history of colonic polyps: Secondary | ICD-10-CM

## 2021-11-14 MED ORDER — NA SULFATE-K SULFATE-MG SULF 17.5-3.13-1.6 GM/177ML PO SOLN: 2.0000 | Freq: Once | ORAL | 0 refills | Status: AC

## 2021-11-14 MED ORDER — PEG 3350-KCL-NA BICARB-NACL 420 G PO SOLR: 4000.0000 mL | Freq: Once | ORAL | 0 refills | Status: DC

## 2021-11-14 NOTE — Patient Instructions (Signed)
High-Fiber Eating Plan °Fiber, also called dietary fiber, is a type of carbohydrate. It is found foods such as fruits, vegetables, whole grains, and beans. A high-fiber diet can have many health benefits. Your health care provider may recommend a high-fiber diet to help: °Prevent constipation. Fiber can make your bowel movements more regular. °Lower your cholesterol. °Relieve the following conditions: °Inflammation of veins in the anus (hemorrhoids). °Inflammation of specific areas of the digestive tract (uncomplicated diverticulosis). °A problem of the large intestine, also called the colon, that sometimes causes pain and diarrhea (irritable bowel syndrome, or IBS). °Prevent overeating as part of a weight-loss plan. °Prevent heart disease, type 2 diabetes, and certain cancers. °What are tips for following this plan? °Reading food labels ° °Check the nutrition facts label on food products for the amount of dietary fiber. Choose foods that have 5 grams of fiber or more per serving. °The goals for recommended daily fiber intake include: °Men (age 50 or younger): 34-38 g. °Men (over age 50): 28-34 g. °Women (age 50 or younger): 25-28 g. °Women (over age 50): 22-25 g. °Your daily fiber goal is _____________ g. °Shopping °Choose whole fruits and vegetables instead of processed forms, such as apple juice or applesauce. °Choose a wide variety of high-fiber foods such as avocados, lentils, oats, and kidney beans. °Read the nutrition facts label of the foods you choose. Be aware of foods with added fiber. These foods often have high sugar and sodium amounts per serving. °Cooking °Use whole-grain flour for baking and cooking. °Cook with brown rice instead of white rice. °Meal planning °Start the day with a breakfast that is high in fiber, such as a cereal that contains 5 g of fiber or more per serving. °Eat breads and cereals that are made with whole-grain flour instead of refined flour or white flour. °Eat brown rice, bulgur  wheat, or millet instead of white rice. °Use beans in place of meat in soups, salads, and pasta dishes. °Be sure that half of the grains you eat each day are whole grains. °General information °You can get the recommended daily intake of dietary fiber by: °Eating a variety of fruits, vegetables, grains, nuts, and beans. °Taking a fiber supplement if you are not able to take in enough fiber in your diet. It is better to get fiber through food than from a supplement. °Gradually increase how much fiber you consume. If you increase your intake of dietary fiber too quickly, you may have bloating, cramping, or gas. °Drink plenty of water to help you digest fiber. °Choose high-fiber snacks, such as berries, raw vegetables, nuts, and popcorn. °What foods should I eat? °Fruits °Berries. Pears. Apples. Oranges. Avocado. Prunes and raisins. Dried figs. °Vegetables °Sweet potatoes. Spinach. Kale. Artichokes. Cabbage. Broccoli. Cauliflower. Green peas. Carrots. Squash. °Grains °Whole-grain breads. Multigrain cereal. Oats and oatmeal. Brown rice. Barley. Bulgur wheat. Millet. Quinoa. Bran muffins. Popcorn. Rye wafer crackers. °Meats and other proteins °Navy beans, kidney beans, and pinto beans. Soybeans. Split peas. Lentils. Nuts and seeds. °Dairy °Fiber-fortified yogurt. °Beverages °Fiber-fortified soy milk. Fiber-fortified orange juice. °Other foods °Fiber bars. °The items listed above may not be a complete list of recommended foods and beverages. Contact a dietitian for more information. °What foods should I avoid? °Fruits °Fruit juice. Cooked, strained fruit. °Vegetables °Fried potatoes. Canned vegetables. Well-cooked vegetables. °Grains °White bread. Pasta made with refined flour. White rice. °Meats and other proteins °Fatty cuts of meat. Fried chicken or fried fish. °Dairy °Milk. Yogurt. Cream cheese. Sour cream. °Fats and   oils °Butters. °Beverages °Soft drinks. °Other foods °Cakes and pastries. °The items listed above may  not be a complete list of foods and beverages to avoid. Talk with your dietitian about what choices are best for you. °Summary °Fiber is a type of carbohydrate. It is found in foods such as fruits, vegetables, whole grains, and beans. °A high-fiber diet has many benefits. It can help to prevent constipation, lower blood cholesterol, aid weight loss, and reduce your risk of heart disease, diabetes, and certain cancers. °Increase your intake of fiber gradually. Increasing fiber too quickly may cause cramping, bloating, and gas. Drink plenty of water while you increase the amount of fiber you consume. °The best sources of fiber include whole fruits and vegetables, whole grains, nuts, seeds, and beans. °This information is not intended to replace advice given to you by your health care provider. Make sure you discuss any questions you have with your health care provider. °Document Revised: 03/14/2020 Document Reviewed: 03/14/2020 °Elsevier Patient Education © 2022 [employer] ° °

## 2021-11-14 NOTE — Progress Notes (Signed)
Cephas Darby, MD [address]  [place]  [place], [place] [address]  Main: [phone]  Fax: [phone]    Gastroenterology Consultation  Referring Provider:     Nobie Putnam * Primary Care Physician:  [name], DO Primary Gastroenterologist:  Dr. Cephas Darby Reason for Consultation:     Chronic constipation        HPI:   Cassidy Norris is a 70 y.o. female referred by Dr. [name], [name], DO  for consultation & management of chronic constipation and to discuss about colonoscopy given history of colon polyps.  Patient reports that she has been suffering from constipation for last 6 months associated with abdominal bloating and significant amount of burping.  She reports that she has to strain and her stools are hard pellets.  She notes that she does not drink adequate amount of water.  She denies any rectal bleeding.  Her bowel movements are either daily or every other day.  She has tried Colace only.  She has MiraLAX at home.  Patient denies any abdominal pain.  She notes that she does not consume adequate amount of fiber.  NSAIDs: None  Antiplts/Anticoagulants/Anti thrombotics: None  GI Procedures: Colonoscopy in 2016, unremarkable  Past Medical History:  Diagnosis Date   Anemia    Arthritis not really diagnosis but have pain   B12 deficiency 07/23/2016   Will check levels to determine if injections are needed again. Await results. Recent CBC at [facility]c [facility]   Blood transfusion without reported diagnosis 1983 had a miscarriage/dnc   Cancer ([place]) 12/30/2013   Multifocal disease: pT1c,m, N0(isolated tumor cells) Er/ PR positive, Her 2 negative.   Cancer ([place]) 01/09/2014   Invasive lobular carcinoma, 2.2 cm, T2,N1 ER/PR positive, HER-2/neu negative   Cataract 2014?   Centrilobular emphysema ([place]) 10/27/2021   Diffuse cystic mastopathy    Essential hypertension, benign 09/24/2017   Family history of malignant neoplasm of  gastrointestinal tract 2012   Fractured elbow 08/28/14   Gastroesophageal reflux disease    Hx of metabolic acidosis with increased anion gap    Increased heart rate    Multiple sclerosis (HCC)    Neuromuscular disorder (HCC)    neuropathy in bil feet - left is worse.   Obesity, unspecified    Peripheral neuropathy    Personal history of tobacco use, presenting hazards to health    Restless leg syndrome    Sleep apnea    uses CPAP   Special screening for malignant neoplasms, colon     Past Surgical History:  Procedure Laterality Date   BREAST BIOPSY Right 1973,2001   BREAST BIOPSY Right 11-07-13   INVASIVE MAMMARY CARCINOMA , ER/PR positive Her2 negative   BREAST BIOPSY Left 11-27-13   invasive lobular and DCIS   BREAST SURGERY Bilateral 01/09/14   mastectomy   CHOLECYSTECTOMY     COLONOSCOPY  2010   Dr. Jamal Collin   COLONOSCOPY WITH PROPOFOL N/A 06/11/2015   Procedure: COLONOSCOPY WITH PROPOFOL;  Surgeon: Christene Lye, MD;  Location: [facility];  Service: Endoscopy;  Laterality: N/A;   DILATATION & CURETTAGE/HYSTEROSCOPY WITH MYOSURE N/A 07/12/2018   Procedure: DILATATION & CURETTAGE/HYSTEROSCOPY WITH MYOSURE WITH ENDOMETRIAL POLYPECTOMY;  Surgeon: Will Bonnet, MD;  Location: [facility];  Service: Gynecology;  Laterality: N/A;   DILATION AND CURETTAGE OF UTERUS  1983   JOINT REPLACEMENT  11/23/2015   POLYPECTOMY  1989   PORTA [place]  2015   SPINE SURGERY  09/14/14   Ruptured disk L4- L5   TOTAL KNEE ARTHROPLASTY Left 11/22/2015   Procedure: TOTAL KNEE ARTHROPLASTY;  Surgeon: Earlie Server, MD;  Location: [facility];  Service: Orthopedics;  Laterality: Left;   TUBAL LIGATION     WISDOM TOOTH EXTRACTION  2006    Current Outpatient Medications:    acetaminophen (TYLENOL) 500 MG tablet, Take 1,000 mg by mouth 2 (two) times daily as needed for moderate pain or headache., Disp: , Rfl:    baclofen (LIORESAL) 10 MG tablet, Take 1 tablet (10  mg total) by mouth 3 (three) times daily as needed for muscle spasms., Disp: 90 tablet, Rfl: 1   calcium carbonate (OSCAL) 1500 (600 Ca) MG TABS tablet, Take by mouth 2 (two) times daily with a meal., Disp: , Rfl:    diclofenac Sodium (VOLTAREN) 1 % GEL, Apply 2 g topically 3 (three) times daily as needed., Disp: 100 g, Rfl: 2   famotidine (PEPCID) 20 MG tablet, TAKE 1 TABLET BY MOUTH TWICE A DAY BEFORE MEALS, Disp: 180 tablet, Rfl: 0   fluticasone (FLONASE) 50 MCG/ACT nasal spray, PLACE 2 SPRAYS INTO BOTH NOSTRILS AT BEDTIME., Disp: 48 g, Rfl: 1   Krill Oil 1000 MG CAPS, Take by mouth., Disp: , Rfl:    lisinopril (ZESTRIL) 5 MG tablet, TAKE 1 TABLET BY MOUTH EVERY EVENING, Disp: 90 tablet, Rfl: 3   Melatonin 10 MG CAPS, Take by mouth., Disp: , Rfl:    metFORMIN (GLUCOPHAGE) 500 MG tablet, Take 1 tablet (500 mg total) by mouth 2 (two) times daily with a meal., Disp: 180 tablet, Rfl: 3   polyethylene glycol powder (GLYCOLAX/MIRALAX) powder, Take 17 g by mouth 2 (two) times daily as needed for moderate constipation., Disp: 3350 g, Rfl: 1   polyethylene glycol-electrolytes (NULYTELY) 420 g solution, Take 4,000 mLs by mouth once for 1 dose., Disp: 4000 mL, Rfl: 0   pregabalin (LYRICA) 25 MG capsule, Take 1 capsule (25 mg total) by mouth 2 (two) times daily., Disp: 60 capsule, Rfl: 2   traZODone (DESYREL) 100 MG tablet, Take 1 tablet (100 mg total) by mouth at bedtime., Disp: 90 tablet, Rfl: 3   venlafaxine XR (EFFEXOR-XR) 75 MG 24 hr capsule, Take 1 capsule (75 mg total) by mouth daily with breakfast., Disp: 360 capsule, Rfl: 0   vitamin B-12 (CYANOCOBALAMIN) 1000 MCG tablet, Take 2,500 mcg by mouth daily., Disp: , Rfl:    Vitamin D, Cholecalciferol, 50 MCG (2000 UT) CAPS, Take 1 capsule by mouth every 3 (three) days., Disp: , Rfl:     Family History  Problem Relation Age of Onset   Cancer Mother        lung   COPD Mother    Cancer Father        colon, stomach   Diabetes Maternal Grandmother     Hypertension Sister    Rectal cancer Paternal Uncle    Rectal cancer Paternal Uncle      Social History   Tobacco Use   Smoking status: Former    Packs/day: 1.00    Years: 30.00    Pack years: 30.00    Types: Cigarettes    Quit date: 12/11/2013    Years since quitting: 7.9   Smokeless tobacco: Former  Scientific laboratory technician Use: Never used  Substance Use Topics   Alcohol use: No    Alcohol/week: 0.0 standard drinks    Comment: social   Drug use: No  Allergies as of 11/14/2021   (No Known Allergies)    Review of Systems:    All systems reviewed and negative except where noted in HPI.   Physical Exam:  BP 115/68 (BP Location: Left Arm, Patient Position: Sitting, Cuff Size: Large)    Pulse (!) 130    Temp 98.1 F (36.7 C) (Temporal)    Ht _0  (1.651 m)    Wt 205 lb (93 kg)    LMP 12/22/1999 (Approximate)    BMI 34.11 kg/m  Patient's last menstrual period was 12/22/1999 (approximate).  General:   Alert,  Well-developed, well-nourished, pleasant and cooperative in NAD Head:  Normocephalic and atraumatic. Eyes:  Sclera clear, no icterus.   Conjunctiva pink. Ears:  Normal auditory acuity. Nose:  No deformity, discharge, or lesions. Mouth:  No deformity or lesions,oropharynx pink & moist. Neck:  Supple; no masses or thyromegaly. Lungs:  Respirations even and unlabored.  Clear throughout to auscultation.   No wheezes, crackles, or rhonchi. No acute distress. Heart:  Regular rate and rhythm; no murmurs, clicks, rubs, or gallops. Abdomen:  Normal bowel sounds. Soft, non-tender and significantly distended, tympanic to percussion without masses, hepatosplenomegaly or hernias noted.  No guarding or rebound tenderness.   Rectal: Not performed Msk:  Symmetrical without gross deformities. Good, equal movement & strength bilaterally. Pulses:  Normal pulses noted. Extremities:  No clubbing or edema.  No cyanosis. Neurologic:  Alert and oriented x3;  grossly normal  neurologically. Skin:  Intact without significant lesions or rashes. No jaundice. Psych:  Alert and cooperative. Normal mood and affect.  Imaging Studies: No recent abdominal imaging  Assessment and Plan:   ILIZA BLANKENBECKLER is a 70 y.o. female with history of bilateral adenocarcinoma s/p bilateral mastectomy, adjuvant chemotherapy, was on letrozole and then tamoxifen, did not complete the recommended 5 years of treatment is seen in consultation for chronic constipation and personal history of colon polyps and family history of colorectal cancer  Chronic constipation Discussed about high-fiber diet, fiber supplements, information provided Discussed about adequate intake of water Recommended to start MiraLAX 34 g daily  Personal history of colon polyps and family history of colorectal cancer Recommend surveillance colonoscopy   Follow up as needed   Cephas Darby, MD

## 2021-11-14 NOTE — Addendum Note (Signed)
Addended by: Eliseo Squires on: 11/14/2021 09:36 AM   Modules accepted: Orders

## 2021-11-25 ENCOUNTER — Encounter: Payer: Self-pay | Admitting: Neurology

## 2021-12-01 DIAGNOSIS — J301 Allergic rhinitis due to pollen: Secondary | ICD-10-CM | POA: Diagnosis not present

## 2021-12-01 DIAGNOSIS — G4733 Obstructive sleep apnea (adult) (pediatric): Secondary | ICD-10-CM | POA: Diagnosis not present

## 2021-12-02 ENCOUNTER — Other Ambulatory Visit: Payer: Self-pay

## 2021-12-02 DIAGNOSIS — G35 Multiple sclerosis: Secondary | ICD-10-CM

## 2021-12-02 NOTE — Progress Notes (Signed)
Dr.Jaffe recommends   Checking  a vitamin D level, sed rate and CRP.  Headache likely unrelated to MS but it is reasonable to check MRI of brain with and without contrast to evaluate new onset headache over 50 and to follow up on MS monitoring.

## 2021-12-03 ENCOUNTER — Telehealth: Payer: Self-pay | Admitting: *Deleted

## 2021-12-03 ENCOUNTER — Ambulatory Visit
Admission: RE | Admit: 2021-12-03 | Discharge: 2021-12-03 | Disposition: A | Discharge status: Home / Self Care | Payer: Medicare Other | Source: Ambulatory Visit | Attending: Oncology | Admitting: Oncology

## 2021-12-03 ENCOUNTER — Other Ambulatory Visit: Payer: Self-pay

## 2021-12-03 DIAGNOSIS — I728 Aneurysm of other specified arteries: Secondary | ICD-10-CM | POA: Diagnosis not present

## 2021-12-03 DIAGNOSIS — R911 Solitary pulmonary nodule: Secondary | ICD-10-CM | POA: Insufficient documentation

## 2021-12-03 DIAGNOSIS — I700 Atherosclerosis of aorta: Secondary | ICD-10-CM | POA: Diagnosis not present

## 2021-12-03 DIAGNOSIS — R599 Enlarged lymph nodes, unspecified: Secondary | ICD-10-CM | POA: Diagnosis not present

## 2021-12-03 DIAGNOSIS — R079 Chest pain, unspecified: Secondary | ICD-10-CM | POA: Diagnosis not present

## 2021-12-03 LAB — POCT I-STAT CREATININE: Creatinine, Ser: 1.1 mg/dL — ABNORMAL HIGH (ref 0.44–1.00)

## 2021-12-03 MED ORDER — IOHEXOL 300 MG/ML  SOLN
75.0000 mL | Freq: Once | INTRAMUSCULAR | Status: AC | PRN
  Administered 2021-12-03: 75 mL via INTRAVENOUS

## 2021-12-03 NOTE — Telephone Encounter (Signed)
Called report, Next appointment 12/09/21  IMPRESSION: 1. Massive adenopathy in the left axillary region extending into the deep soft tissues of the left subclavicular region, as above, highly concerning for nodal metastasis in this patient with history of breast cancer. 2. No pulmonary nodules to suggest metastatic disease to the lungs. 3. Sclerotic lesions in the visualized skeleton appear similar to prior studies and are favored to represent bone islands. 4. Aortic atherosclerosis. 5. Hepatic steatosis. 6. 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   These results will be called to the ordering clinician or representative by the Radiologist Assistant, and communication documented in the PACS or [employer].   Aortic Atherosclerosis (ICD10-I70.0).     Electronically Signed   By: Vinnie Langton M.D.   On: 12/03/2021 12:09

## 2021-12-05 ENCOUNTER — Other Ambulatory Visit: Payer: Self-pay

## 2021-12-05 DIAGNOSIS — G35 Multiple sclerosis: Secondary | ICD-10-CM

## 2021-12-05 DIAGNOSIS — R519 Headache, unspecified: Secondary | ICD-10-CM

## 2021-12-07 ENCOUNTER — Other Ambulatory Visit: Payer: Self-pay | Admitting: Family Medicine

## 2021-12-07 DIAGNOSIS — K219 Gastro-esophageal reflux disease without esophagitis: Secondary | ICD-10-CM

## 2021-12-07 NOTE — Telephone Encounter (Signed)
Requested Prescriptions  Pending Prescriptions Disp Refills   famotidine (PEPCID) 20 MG tablet [Pharmacy Med Name: FAMOTIDINE 20 MG TABLET] 180 tablet 0    Sig: TAKE 1 TABLET BY MOUTH TWICE A DAY BEFORE MEALS     Gastroenterology:  H2 Antagonists Passed - 12/07/2021  9:17 AM      Passed - Valid encounter within last 12 months    Recent Outpatient Visits          1 month ago Annual physical exam   [facility] [name], DO   7 months ago Chest wall pain   [place], DO   1 year ago Type 2 diabetes mellitus with other specified complication, without long-term current use of insulin [facility])   [facility], [name], DO   1 year ago Type 2 diabetes mellitus with other specified complication, without long-term current use of insulin ([place])   [facility] [name], DO   2 years ago Primary insomnia   [place], DO      Future Appointments            In 4 months [name], [name], DO [facility], [place]   In 8 months [name], MD [place]

## 2021-12-08 NOTE — Progress Notes (Signed)
Cassidy Norris  Telephone:([phone]) [phone]  Fax:(336) [place] DOB: 1951/04/17  MR#: [id number]  TFT#:[id number]  Patient Care Team: [name], DO as PCP - General (Family Medicine) Christene Lye, MD (General Surgery) Forest Gleason, MD (Oncology) Pieter Partridge, DO as Consulting Physician (Neurology)  CHIEF COMPLAINT: Bilateral adenocarcinoma of the breast.  INTERVAL HISTORY: Patient returns to clinic today for further evaluation, discussion of her CT scan results, and additional diagnostic planning.  She still has mild chest wall tenderness/discomfort.  She otherwise feels well.  She has occasional headaches and states her neurologist has ordered an MRI of her brain to reevaluate her MS.  She has no other neurologic complaints.  She denies any recent fevers or illnesses. She has a good appetite and denies weight loss.  She has no chest pain, shortness of breath, cough, or hemoptysis.  She denies any nausea, vomiting, constipation, or diarrhea. She has no urinary complaints.  Patient offers no further specific complaints today.  REVIEW OF SYSTEMS:   Review of Systems  Constitutional: Negative.  Negative for fever, malaise/fatigue and weight loss.  Respiratory: Negative.  Negative for cough, hemoptysis and shortness of breath.   Cardiovascular: Negative.  Negative for chest pain and leg swelling.  Gastrointestinal: Negative.  Negative for abdominal pain.  Genitourinary: Negative.  Negative for dysuria.  Musculoskeletal:  Positive for joint pain. Negative for myalgias.  Skin: Negative.  Negative for rash.  Neurological:  Positive for headaches. Negative for dizziness, sensory change, focal weakness and weakness.  Psychiatric/Behavioral: Negative.  The patient is not nervous/anxious.    As per HPI. Otherwise, a complete review of systems is negative.  ONCOLOGY HISTORY: Oncology History Overview Note  1. Bilateral carcinoma of breast,  February, 2015. Right breast status post radical mastectomy. T1 C. N0M0 (isolated tumor cells in one lymph node) multifocal invasive cancer. Stage IC, Left breast, Status post radical mastectomy, T2, N1, M0 tumor. Stage II, RIght breast. Both tumors are estrogen receptor positive.  Progesterone receptor positive.  HER-2 receptor negative by FISH. Negative for BRCA mutation.  2. Started on Adriamycin and Cytoxan on March 01, 2014. Last cycle of Cytoxan and Adriamycin on May 01, 2014. Patient has finished last chemotherapy on September 9. (12 th dose was omitted because of neuropathy) 3.  Taking tamoxifen.  Patient had a poor tolerance to letrozole.(October, 2015) 4.  Patient is taking letrozole since November of 2015 5.  May, 2016 Letrozole has been put on hold because of bony pains 6.  Started on Aromasin from July of 2016    History of bilateral breast cancer (Resolved)  11/29/2013 Initial Diagnosis   Breast cancer bilateral, multifocal ER/PR pos, Her 2 neg,.     PAST MEDICAL HISTORY: Past Medical History:  Diagnosis Date   Anemia    Arthritis not really diagnosis but have pain   B12 deficiency 07/23/2016   Will check levels to determine if injections are needed again. Await results. Recent CBC at Onc WNL   Blood transfusion without reported diagnosis 1983 had a miscarriage/dnc   Cancer ([place]) 12/30/2013   Multifocal disease: pT1c,m, N0(isolated tumor cells) Er/ PR positive, Her 2 negative.   Cancer ([place]) 01/09/2014   Invasive lobular carcinoma, 2.2 cm, T2,N1 ER/PR positive, HER-2/neu negative   Cataract 2014?   Centrilobular emphysema ([place]) 10/27/2021   Diabetes mellitus without complication ([place])    Diffuse cystic mastopathy    Essential hypertension, benign 09/24/2017   Family history of  malignant neoplasm of gastrointestinal tract 2012   Fractured elbow 08/28/2014   Gastroesophageal reflux disease    Hx of metabolic acidosis with increased anion gap    Increased heart rate     Multiple sclerosis (HCC)    Neuromuscular disorder (HCC)    neuropathy in bil feet - left is worse.   Obesity, unspecified    Peripheral neuropathy    Personal history of tobacco use, presenting hazards to health    Restless leg syndrome    Sleep apnea    uses CPAP   Special screening for malignant neoplasms, colon     PAST SURGICAL HISTORY: Past Surgical History:  Procedure Laterality Date   BREAST BIOPSY Right 1973,2001   BREAST BIOPSY Right 11-07-13   INVASIVE MAMMARY CARCINOMA , ER/PR positive Her2 negative   BREAST BIOPSY Left 11-27-13   invasive lobular and DCIS   BREAST SURGERY Bilateral 01/09/14   mastectomy   CHOLECYSTECTOMY     COLONOSCOPY  2010   Dr. Jamal Collin   COLONOSCOPY WITH PROPOFOL N/A 06/11/2015   Procedure: COLONOSCOPY WITH PROPOFOL;  Surgeon: Christene Lye, MD;  Location: [facility];  Service: Endoscopy;  Laterality: N/A;   DILATATION & CURETTAGE/HYSTEROSCOPY WITH MYOSURE N/A 07/12/2018   Procedure: DILATATION & CURETTAGE/HYSTEROSCOPY WITH MYOSURE WITH ENDOMETRIAL POLYPECTOMY;  Surgeon: Will Bonnet, MD;  Location: [facility];  Service: Gynecology;  Laterality: N/A;   DILATION AND CURETTAGE OF UTERUS  1983   JOINT REPLACEMENT  11/23/2015   POLYPECTOMY  1989   PORTA CATH REMOVAL     PORTACATH PLACEMENT  2015   SPINE SURGERY  09/14/14   Ruptured disk L4- L5   TOTAL KNEE ARTHROPLASTY Left 11/22/2015   Procedure: TOTAL KNEE ARTHROPLASTY;  Surgeon: Earlie Server, MD;  Location: [place];  Service: Orthopedics;  Laterality: Left;   TUBAL LIGATION     WISDOM TOOTH EXTRACTION  2006    FAMILY HISTORY Family History  Problem Relation Age of Onset   Cancer Mother        lung   COPD Mother    Cancer Father        colon, stomach   Diabetes Maternal Grandmother    Hypertension Sister    Rectal cancer Paternal Uncle    Rectal cancer Paternal Uncle     GYNECOLOGIC HISTORY:  Patient's last menstrual period was 12/22/1999 (approximate).      ADVANCED DIRECTIVES:    HEALTH MAINTENANCE: Social History   Tobacco Use   Smoking status: Former    Packs/day: 1.00    Years: 30.00    Pack years: 30.00    Types: Cigarettes    Quit date: 12/11/2013    Years since quitting: 8.0   Smokeless tobacco: Former  Scientific laboratory technician Use: Never used  Substance Use Topics   Alcohol use: No    Alcohol/week: 0.0 standard drinks    Comment: social   Drug use: No     Colonoscopy:  PAP:  Bone density:  Mammogram:  No Known Allergies  Current Outpatient Medications  Medication Sig Dispense Refill   acetaminophen (TYLENOL) 500 MG tablet Take 1,000 mg by mouth 2 (two) times daily as needed for moderate pain or headache.     baclofen (LIORESAL) 10 MG tablet Take 1 tablet (10 mg total) by mouth 3 (three) times daily as needed for muscle spasms. 90 tablet 1   calcium carbonate (OSCAL) 1500 (600 Ca) MG TABS tablet Take by mouth 2 (two) times daily with a meal.  diclofenac Sodium (VOLTAREN) 1 % GEL Apply 2 g topically 3 (three) times daily as needed. 100 g 2   famotidine (PEPCID) 20 MG tablet TAKE 1 TABLET BY MOUTH TWICE A DAY BEFORE MEALS 180 tablet 0   fluticasone (FLONASE) 50 MCG/ACT nasal spray PLACE 2 SPRAYS INTO BOTH NOSTRILS AT BEDTIME. 48 g 1   Krill Oil 1000 MG CAPS Take by mouth.     lisinopril (ZESTRIL) 5 MG tablet TAKE 1 TABLET BY MOUTH EVERY EVENING 90 tablet 3   metFORMIN (GLUCOPHAGE) 500 MG tablet Take 1 tablet (500 mg total) by mouth 2 (two) times daily with a meal. 180 tablet 3   polyethylene glycol powder (GLYCOLAX/MIRALAX) powder Take 17 g by mouth 2 (two) times daily as needed for moderate constipation. 3350 g 1   traZODone (DESYREL) 100 MG tablet Take 1 tablet (100 mg total) by mouth at bedtime. 90 tablet 3   venlafaxine XR (EFFEXOR-XR) 75 MG 24 hr capsule Take 1 capsule (75 mg total) by mouth daily with breakfast. 360 capsule 0   vitamin B-12 (CYANOCOBALAMIN) 1000 MCG tablet Take 2,500 mcg by mouth daily.      Vitamin D, Cholecalciferol, 50 MCG (2000 UT) CAPS Take 1 capsule by mouth every 3 (three) days.     Melatonin 10 MG CAPS Take by mouth. (Patient not taking: Reported on 12/09/2021)     pregabalin (LYRICA) 25 MG capsule Take 1 capsule (25 mg total) by mouth 2 (two) times daily. (Patient not taking: Reported on 12/09/2021) 60 capsule 2   No current facility-administered medications for this visit.    OBJECTIVE: BP 116/73    Pulse 89    Temp (!) 97.2 F (36.2 C)    Resp 16    Wt 205 lb 6.4 oz (93.2 kg)    LMP 12/22/1999 (Approximate)    SpO2 98%    BMI 34.18 kg/m    Body mass index is 34.18 kg/m.    ECOG FS:0 - Asymptomatic  General: Well-developed, well-nourished, no acute distress. Eyes: Pink conjunctiva, anicteric sclera. HEENT: Normocephalic, moist mucous membranes. Breast: Bilateral mastectomy.  Lymphadenopathy palpated in left axilla. Lungs: No audible wheezing or coughing. Heart: Regular rate and rhythm. Abdomen: Soft, nontender, no obvious distention. Musculoskeletal: No edema, cyanosis, or clubbing. Neuro: Alert, answering all questions appropriately. Cranial nerves grossly intact. Skin: No rashes or petechiae noted. Psych: Normal affect.   LAB RESULTS:  No visits with results within 3 Day(s) from this visit.  Latest known visit with results is:  Hospital Outpatient Visit on 12/03/2021  Component Date Value Ref Range Status   Creatinine, Ser 12/03/2021 1.10 (H)  0.44 - 1.00 mg/dL Final    STUDIES: No results found.  ASSESSMENT: Bilateral adenocarcinoma of the breast (right breast, stage I, T1c N0 M0 (isolated tumor cells in 1 lymph node; left breast, stage II, T2 N1 M0.)  PLAN:    1. Bilateral adenocarcinoma of the breast: Patient is status post bilateral mastectomy in February 2015. She also received adjuvant chemotherapy with Adriamycin, Cytoxan, and Taxol completing treatment in approximately October 2015. She then initiated letrozole, but could not tolerate secondary  to leg pain and was switched to Aromasin.  Patient then switched to tamoxifen in October 2019 secondary to cost.  Patient subsequently discontinued tamoxifen and did not complete the recommended 5 years of treatment.  2. Postmenopausal: Patient's last bone mineral density was on August 01, 2016 and revealed a T-score of -1.2. Continue monitoring bone mineral density by PCP. 3.  Multiple sclerosis: Chronic.  Continue evaluation and treatment per neurology.  Patient reports she has a brain MRI in the near future. 4.  Esophageal discomfort/mild dysphagia: Patient reports a colonoscopy in the near future, can also consider EGD for further evaluation. 5.  Lymphadenopathy: CT scan results from December 03, 2021 reviewed independently and reported as above with significant lymphadenopathy in left axilla and supraclavicular region.  Unclear if this is inflammatory, breast cancer recurrence, or a separate diagnosis such as lymphoma.  We will get a biopsy and PET scan to further evaluate.  Return to clinic approximately 1 week after biopsy to discuss results and treatment planning if necessary.  I spent a total of 30 minutes reviewing chart data, face-to-face evaluation with the patient, counseling and coordination of care as detailed above.    Patient expressed understanding and was in agreement with this plan. She also understands that She can call clinic at any time with any questions, concerns, or complaints.   Breast cancer bilateral, multifocal ER/PR pos, Her 2 neg,.   Staging form: Breast, AJCC 7th Edition     Clinical: Stage IIB (T2, N1, M0) - Signed by Forest Gleason, MD on 04/22/2015   Lloyd Huger, MD   12/09/2021 12:38 PM

## 2021-12-09 ENCOUNTER — Inpatient Hospital Stay: Payer: Medicare Other | Attending: Oncology | Admitting: Oncology

## 2021-12-09 ENCOUNTER — Encounter: Payer: Self-pay | Admitting: Family Medicine

## 2021-12-09 ENCOUNTER — Inpatient Hospital Stay: Payer: Medicare Other

## 2021-12-09 ENCOUNTER — Other Ambulatory Visit: Payer: Self-pay

## 2021-12-09 VITALS — BP 116/73 | HR 89 | Temp 97.2°F | Resp 16 | Wt 205.4 lb

## 2021-12-09 DIAGNOSIS — Z79899 Other long term (current) drug therapy: Secondary | ICD-10-CM | POA: Diagnosis not present

## 2021-12-09 DIAGNOSIS — Z170 Estrogen receptor positive status [ER+]: Secondary | ICD-10-CM | POA: Insufficient documentation

## 2021-12-09 DIAGNOSIS — Z853 Personal history of malignant neoplasm of breast: Secondary | ICD-10-CM | POA: Insufficient documentation

## 2021-12-09 DIAGNOSIS — Z9013 Acquired absence of bilateral breasts and nipples: Secondary | ICD-10-CM | POA: Insufficient documentation

## 2021-12-09 DIAGNOSIS — G35 Multiple sclerosis: Secondary | ICD-10-CM | POA: Diagnosis not present

## 2021-12-10 LAB — CANCER ANTIGEN 27.29: CA 27.29: 260.1 U/mL — ABNORMAL HIGH (ref 0.0–38.6)

## 2021-12-11 ENCOUNTER — Other Ambulatory Visit: Payer: Self-pay | Admitting: Internal Medicine

## 2021-12-11 ENCOUNTER — Telehealth: Payer: Self-pay | Admitting: Emergency Medicine

## 2021-12-11 NOTE — Telephone Encounter (Signed)
Called pt to inform her of scheduling information for lymph node biopsy. Pt confirmed availabilty to be at [facility] on Mon 1/23 at 1pm for 1:30pm appointment.

## 2021-12-12 ENCOUNTER — Other Ambulatory Visit: Payer: Self-pay | Admitting: Radiology

## 2021-12-14 ENCOUNTER — Other Ambulatory Visit
Admission: RE | Admit: 2021-12-14 | Discharge: 2021-12-14 | Disposition: A | Discharge status: Home / Self Care | Payer: Medicare Other | Source: Home / Self Care | Attending: Neurology | Admitting: Neurology

## 2021-12-14 ENCOUNTER — Other Ambulatory Visit: Payer: Self-pay

## 2021-12-14 ENCOUNTER — Ambulatory Visit
Admission: RE | Admit: 2021-12-14 | Discharge: 2021-12-14 | Disposition: A | Discharge status: Home / Self Care | Payer: Medicare Other | Source: Ambulatory Visit | Attending: Neurology | Admitting: Neurology

## 2021-12-14 DIAGNOSIS — R519 Headache, unspecified: Secondary | ICD-10-CM | POA: Diagnosis not present

## 2021-12-14 DIAGNOSIS — G35 Multiple sclerosis: Secondary | ICD-10-CM | POA: Insufficient documentation

## 2021-12-14 DIAGNOSIS — G9389 Other specified disorders of brain: Secondary | ICD-10-CM | POA: Diagnosis not present

## 2021-12-14 LAB — SEDIMENTATION RATE: Sed Rate: 9 mm/hr (ref 0–30)

## 2021-12-14 LAB — C-REACTIVE PROTEIN: CRP: 0.7 mg/dL (ref <1.0)

## 2021-12-14 MED ORDER — GADOBUTROL 1 MMOL/ML IV SOLN
9.0000 mL | Freq: Once | INTRAVENOUS | Status: AC | PRN
  Administered 2021-12-14: 10 mL via INTRAVENOUS

## 2021-12-15 ENCOUNTER — Ambulatory Visit
Admission: RE | Admit: 2021-12-15 | Discharge: 2021-12-15 | Disposition: A | Discharge status: Home / Self Care | Payer: Medicare Other | Source: Ambulatory Visit | Attending: Oncology | Admitting: Oncology

## 2021-12-15 DIAGNOSIS — R590 Localized enlarged lymph nodes: Secondary | ICD-10-CM | POA: Diagnosis not present

## 2021-12-15 DIAGNOSIS — C7981 Secondary malignant neoplasm of breast: Secondary | ICD-10-CM | POA: Diagnosis not present

## 2021-12-15 DIAGNOSIS — Z853 Personal history of malignant neoplasm of breast: Secondary | ICD-10-CM | POA: Insufficient documentation

## 2021-12-15 DIAGNOSIS — R599 Enlarged lymph nodes, unspecified: Secondary | ICD-10-CM | POA: Diagnosis not present

## 2021-12-15 NOTE — Procedures (Signed)
°  Procedure: [place] core biopsy L supraclav LAN   EBL:   minimal Complications:  none immediate  See full dictation in [facility].  Dillard Cannon MD Main # [phone] Pager  [phone] Mobile [phone]

## 2021-12-15 NOTE — Progress Notes (Signed)
Pt advised of her MRI results.

## 2021-12-16 DIAGNOSIS — C773 Secondary and unspecified malignant neoplasm of axilla and upper limb lymph nodes: Secondary | ICD-10-CM | POA: Diagnosis not present

## 2021-12-16 DIAGNOSIS — Z853 Personal history of malignant neoplasm of breast: Secondary | ICD-10-CM | POA: Diagnosis not present

## 2021-12-22 ENCOUNTER — Encounter: Payer: Self-pay | Admitting: Gastroenterology

## 2021-12-23 ENCOUNTER — Other Ambulatory Visit: Payer: Medicare Other

## 2021-12-23 ENCOUNTER — Ambulatory Visit: Payer: Medicare Other | Admitting: Anesthesiology

## 2021-12-23 ENCOUNTER — Encounter
Admission: RE | Disposition: A | Discharge status: Home / Self Care | Payer: Self-pay | Source: Home / Self Care | Attending: Gastroenterology

## 2021-12-23 ENCOUNTER — Ambulatory Visit
Admission: RE | Admit: 2021-12-23 | Discharge: 2021-12-23 | Disposition: A | Discharge status: Home / Self Care | Payer: Medicare Other | Attending: Gastroenterology | Admitting: Gastroenterology

## 2021-12-23 ENCOUNTER — Encounter: Payer: Self-pay | Admitting: Gastroenterology

## 2021-12-23 DIAGNOSIS — Z800 Family history of malignant neoplasm of digestive organs: Secondary | ICD-10-CM | POA: Diagnosis not present

## 2021-12-23 DIAGNOSIS — Z1211 Encounter for screening for malignant neoplasm of colon: Secondary | ICD-10-CM | POA: Diagnosis not present

## 2021-12-23 DIAGNOSIS — K2210 Ulcer of esophagus without bleeding: Secondary | ICD-10-CM

## 2021-12-23 DIAGNOSIS — J432 Centrilobular emphysema: Secondary | ICD-10-CM | POA: Insufficient documentation

## 2021-12-23 DIAGNOSIS — D128 Benign neoplasm of rectum: Secondary | ICD-10-CM | POA: Diagnosis not present

## 2021-12-23 DIAGNOSIS — K922 Gastrointestinal hemorrhage, unspecified: Secondary | ICD-10-CM | POA: Diagnosis not present

## 2021-12-23 DIAGNOSIS — Z87891 Personal history of nicotine dependence: Secondary | ICD-10-CM | POA: Diagnosis not present

## 2021-12-23 DIAGNOSIS — K621 Rectal polyp: Secondary | ICD-10-CM | POA: Diagnosis not present

## 2021-12-23 DIAGNOSIS — Z860100 Personal history of colon polyps, unspecified: Secondary | ICD-10-CM

## 2021-12-23 DIAGNOSIS — M1990 Unspecified osteoarthritis, unspecified site: Secondary | ICD-10-CM | POA: Insufficient documentation

## 2021-12-23 DIAGNOSIS — R1310 Dysphagia, unspecified: Secondary | ICD-10-CM | POA: Insufficient documentation

## 2021-12-23 DIAGNOSIS — G4730 Sleep apnea, unspecified: Secondary | ICD-10-CM | POA: Diagnosis not present

## 2021-12-23 DIAGNOSIS — K5730 Diverticulosis of large intestine without perforation or abscess without bleeding: Secondary | ICD-10-CM | POA: Diagnosis not present

## 2021-12-23 DIAGNOSIS — K635 Polyp of colon: Secondary | ICD-10-CM | POA: Diagnosis not present

## 2021-12-23 DIAGNOSIS — D124 Benign neoplasm of descending colon: Secondary | ICD-10-CM | POA: Insufficient documentation

## 2021-12-23 DIAGNOSIS — K209 Esophagitis, unspecified without bleeding: Secondary | ICD-10-CM | POA: Diagnosis not present

## 2021-12-23 DIAGNOSIS — E119 Type 2 diabetes mellitus without complications: Secondary | ICD-10-CM | POA: Insufficient documentation

## 2021-12-23 DIAGNOSIS — K2100 Gastro-esophageal reflux disease with esophagitis, without bleeding: Secondary | ICD-10-CM | POA: Insufficient documentation

## 2021-12-23 DIAGNOSIS — E785 Hyperlipidemia, unspecified: Secondary | ICD-10-CM | POA: Diagnosis not present

## 2021-12-23 HISTORY — DX: Ulcer of esophagus without bleeding: K22.10

## 2021-12-23 HISTORY — PX: ESOPHAGOGASTRODUODENOSCOPY: SHX5428

## 2021-12-23 HISTORY — PX: COLONOSCOPY WITH PROPOFOL: SHX5780

## 2021-12-23 LAB — GLUCOSE, CAPILLARY
Glucose-Capillary: 118 mg/dL — ABNORMAL HIGH (ref 70–99)
Glucose-Capillary: 97 mg/dL (ref 70–99)

## 2021-12-23 LAB — SURGICAL PATHOLOGY

## 2021-12-23 SURGERY — COLONOSCOPY WITH PROPOFOL
Anesthesia: General

## 2021-12-23 MED ORDER — LIDOCAINE HCL (CARDIAC) PF 100 MG/5ML IV SOSY
PREFILLED_SYRINGE | INTRAVENOUS | Status: DC | PRN
  Administered 2021-12-23: 40 mg via INTRAVENOUS

## 2021-12-23 MED ORDER — PROPOFOL 10 MG/ML IV BOLUS
INTRAVENOUS | Status: DC | PRN
Start: 2021-12-23 — End: 2021-12-23
  Administered 2021-12-23: 80 mg via INTRAVENOUS

## 2021-12-23 MED ORDER — PROPOFOL 500 MG/50ML IV EMUL
INTRAVENOUS | Status: AC
  Filled 2021-12-23: qty 50

## 2021-12-23 MED ORDER — SODIUM CHLORIDE 0.9 % IV SOLN: INTRAVENOUS | Status: DC

## 2021-12-23 MED ORDER — OMEPRAZOLE 40 MG PO CPDR: 40.0000 mg | DELAYED_RELEASE_CAPSULE | Freq: Two times a day (BID) | ORAL | 3 refills | Status: DC

## 2021-12-23 MED ORDER — PROPOFOL 500 MG/50ML IV EMUL
INTRAVENOUS | Status: DC | PRN
  Administered 2021-12-23: 150 ug/kg/min via INTRAVENOUS

## 2021-12-23 NOTE — Anesthesia Preprocedure Evaluation (Signed)
Anesthesia Evaluation  Patient identified by MRN, date of birth, ID band Patient awake    Reviewed: Allergy & Precautions, NPO status , Patient's Chart, lab work & pertinent test results  History of Anesthesia Complications Negative for: history of anesthetic complications  Airway Mallampati: III  TM Distance: <3 FB Neck ROM: full    Dental  (+) Missing   Pulmonary sleep apnea , COPD, former smoker,    Pulmonary exam normal        Cardiovascular hypertension, (-) anginaNormal cardiovascular exam     Neuro/Psych  Neuromuscular disease negative psych ROS   GI/Hepatic Neg liver ROS, GERD  Controlled,  Endo/Other  diabetes, Type 2  Renal/GU negative Renal ROS  negative genitourinary   Musculoskeletal  (+) Arthritis ,   Abdominal   Peds  Hematology negative hematology ROS (+)   Anesthesia Other Findings Past Medical History: No date: Anemia not really diagnosis but have pain: Arthritis 07/23/2016: B12 deficiency     Comment:  Will check levels to determine if injections are needed               again. Await results. Recent CBC at [facility] had a miscarriage/dnc: Blood transfusion without reported  diagnosis 12/30/2013: Cancer ([place])     Comment:  Multifocal disease: pT1c,m, N0(isolated tumor cells) Er/              PR positive, Her 2 negative. 01/09/2014: Cancer ([place])     Comment:  Invasive lobular carcinoma, 2.2 cm, T2,N1 ER/PR               positive, HER-2/neu negative 2014?: Cataract 10/27/2021: Centrilobular emphysema ([place]) No date: Diabetes mellitus without complication ([place]) No date: Diffuse cystic mastopathy 09/24/2017: Essential hypertension, benign 2012: Family history of malignant neoplasm of gastrointestinal tract 08/28/2014: Fractured elbow No date: Gastroesophageal reflux disease No date: Hx of metabolic acidosis with increased anion gap No date: Increased heart rate No date: Multiple  sclerosis (HCC) No date: Neuromuscular disorder ([place])     Comment:  neuropathy in bil feet - left is worse. No date: Obesity, unspecified No date: Peripheral neuropathy No date: Personal history of tobacco use, presenting hazards to health No date: Restless leg syndrome No date: Sleep apnea     Comment:  uses CPAP No date: Special screening for malignant neoplasms, colon  Past Surgical History: [phone]: BREAST BIOPSY; Right 11-07-13: BREAST BIOPSY; Right     Comment:  INVASIVE MAMMARY CARCINOMA , ER/PR positive Her2               negative 11-27-13: BREAST BIOPSY; Left     Comment:  invasive lobular and DCIS 01/09/14: BREAST SURGERY; Bilateral     Comment:  mastectomy No date: CHOLECYSTECTOMY 2010: COLONOSCOPY     Comment:  Dr. Jamal Collin 06/11/2015: COLONOSCOPY WITH PROPOFOL; N/A     Comment:  Procedure: COLONOSCOPY WITH PROPOFOL;  Surgeon:               Christene Lye, MD;  Location: [facility] ENDOSCOPY;                Service: Endoscopy;  Laterality: N/A; 07/12/2018: DILATATION & CURETTAGE/HYSTEROSCOPY WITH MYOSURE; N/A     Comment:  Procedure: DILATATION & CURETTAGE/HYSTEROSCOPY WITH               MYOSURE WITH ENDOMETRIAL POLYPECTOMY;  Surgeon: Will Bonnet, MD;  Location: [facility] ORS;  Service:  Gynecology;              Laterality: N/A; 1983: [place] OF UTERUS 11/23/2015: JOINT REPLACEMENT 1989: POLYPECTOMY No date: PORTA CATH REMOVAL 2015: PORTACATH PLACEMENT 09/14/14: SPINE SURGERY     Comment:  Ruptured disk L4- L5 11/22/2015: TOTAL KNEE ARTHROPLASTY; Left     Comment:  Procedure: TOTAL KNEE ARTHROPLASTY;  Surgeon: Earlie Server, MD;  Location: [facility];  Service: Orthopedics;                Laterality: Left; No date: TUBAL LIGATION 2006: WISDOM TOOTH EXTRACTION  BMI    Body Mass Index: 34.67 kg/m      Reproductive/Obstetrics negative OB ROS                             Anesthesia  Physical Anesthesia Plan  ASA: 3  Anesthesia Plan: General   Post-op Pain Management:    Induction: Intravenous  PONV Risk Score and Plan: Propofol infusion and TIVA  Airway Management Planned: Natural Airway and Nasal Cannula  Additional Equipment:   Intra-op Plan:   Post-operative Plan:   Informed Consent: I have reviewed the patients History and Physical, chart, labs and discussed the procedure including the risks, benefits and alternatives for the proposed anesthesia with the patient or authorized representative who has indicated his/her understanding and acceptance.     Dental Advisory Given  Plan Discussed with: Anesthesiologist, CRNA and Surgeon  Anesthesia Plan Comments: (Patient consented for risks of anesthesia including but not limited to:  - adverse reactions to medications - risk of airway placement if required - damage to eyes, teeth, lips or other oral mucosa - nerve damage due to positioning  - sore throat or hoarseness - Damage to heart, brain, nerves, lungs, other parts of body or loss of life  Patient voiced understanding.)        Anesthesia Quick Evaluation

## 2021-12-23 NOTE — Anesthesia Postprocedure Evaluation (Signed)
Anesthesia Post Note  Patient: [name]  Procedure(s) Performed: COLONOSCOPY WITH PROPOFOL ESOPHAGOGASTRODUODENOSCOPY (EGD)  Patient location during evaluation: PACU Anesthesia Type: General Level of consciousness: awake and alert Pain management: pain level controlled Vital Signs Assessment: post-procedure vital signs reviewed and stable Respiratory status: spontaneous breathing, nonlabored ventilation, respiratory function stable and patient connected to nasal cannula oxygen Cardiovascular status: blood pressure returned to baseline and stable Postop Assessment: no apparent nausea or vomiting Anesthetic complications: no   No notable events documented.   Last Vitals:  Vitals:   12/23/21 1344 12/23/21 1354  BP: 139/77 117/76  Pulse: (!) 101 98  Resp: 11 18  Temp:    SpO2: 100% 100%    Last Pain:  Vitals:   12/23/21 1334  TempSrc: Temporal  PainSc: [place]

## 2021-12-23 NOTE — Op Note (Signed)
[facility] Gastroenterology Patient Name: Cassidy Norris Procedure Date: 12/23/2021 12:53 PM MRN: [id number] Account #: [id number] Date of Birth: Aug 31, 1951 Admit Type: Outpatient Age: 71 Room: [facility] ENDO ROOM 3 Gender: Female Note Status: Finalized Instrument Name: Colonscope [id number] Procedure:             Colonoscopy Indications:           Last colonoscopy: July 2016 Providers:             Lin Landsman MD, MD Referring MD:          [name] (Referring MD) Medicines:             General Anesthesia Complications:         No immediate complications. Estimated blood loss: None. Procedure:             Pre-Anesthesia Assessment:                        - Prior to the procedure, a History and Physical was                         performed, and patient medications and allergies were                         reviewed. The patient is competent. The risks and                         benefits of the procedure and the sedation options and                         risks were discussed with the patient. All questions                         were answered and informed consent was obtained.                         Patient identification and proposed procedure were                         verified by the physician, the nurse, the                         anesthesiologist, the anesthetist and the technician                         in the pre-procedure area in the procedure room in the                         endoscopy suite. Mental Status Examination: alert and                         oriented. Airway Examination: normal oropharyngeal                         airway and neck mobility. Respiratory Examination:                         clear to auscultation. CV Examination: normal. ASA  Grade Assessment: III - A patient with severe systemic                         disease. After reviewing the risks and benefits, the                          patient was deemed in satisfactory condition to                         undergo the procedure. The anesthesia plan was to use                         general anesthesia. Immediately prior to                         administration of medications, the patient was                         re-assessed for adequacy to receive sedatives. The                         heart rate, respiratory rate, oxygen saturations,                         blood pressure, adequacy of pulmonary ventilation, and                         response to care were monitored throughout the                         procedure. The physical status of the patient was                         re-assessed after the procedure.                        After obtaining informed consent, the colonoscope was                         passed under direct vision. Throughout the procedure,                         the patient's blood pressure, pulse, and oxygen                         saturations were monitored continuously. The                         Colonoscope was introduced through the anus and                         advanced to the the cecum, identified by appendiceal                         orifice and ileocecal valve. The colonoscopy was                         unusually difficult due to inadequate bowel prep.  Successful completion of the procedure was aided by                         lavage. The patient tolerated the procedure well. The                         quality of the bowel preparation was evaluated using                         the BBPS [facility] Bowel Preparation Scale) with scores                         of: Right Colon = 1 (portion of mucosa seen, but other                         areas not well seen due to staining, residual stool                         and/or opaque liquid), Transverse Colon = 2 (minor                         amount of residual staining, small fragments of stool                          and/or opaque liquid, but mucosa seen well) and Left                         Colon = 2 (minor amount of residual staining, small                         fragments of stool and/or opaque liquid, but mucosa                         seen well). The total BBPS score equals 5. The quality                         of the bowel preparation was fair. Findings:      The perianal and digital rectal examinations were normal. Pertinent       negatives include normal sphincter tone and no palpable rectal lesions.      Three sessile polyps were found in the rectum and descending colon. The       polyps were 3 to 5 mm in size. These polyps were removed with a cold       snare. Resection and retrieval were complete. Estimated blood loss: none.      The retroflexed view of the distal rectum and anal verge was normal and       showed no anal or rectal abnormalities.      Extensive amounts of semi-liquid stool was found in the entire colon,       precluding visualization. Lavage of the area was performed using 50 -       200 mL of sterile water, resulting in clearance with fair visualization.      Multiple small-mouthed diverticula were found in the sigmoid colon. Impression:            - Preparation of the colon was fair.                        -  Three 3 to 5 mm polyps in the rectum and in the                         descending colon, removed with a cold snare. Resected                         and retrieved.                        - The distal rectum and anal verge are normal on                         retroflexion view.                        - Stool in the entire examined colon.                        - Diverticulosis in the sigmoid colon. Recommendation:        - Discharge patient to home (with escort).                        - Resume previous diet today.                        - Continue present medications.                        - Await pathology results.                        - Repeat colonoscopy  in 6 months with 2 day prep                         because the bowel preparation was poor. Procedure Code(s):     --- Professional ---                        [phone], Colonoscopy, flexible; with removal of                         tumor(s), polyp(s), or other lesion(s) by snare                         technique Diagnosis Code(s):     --- Professional ---                        K62.1, Rectal polyp                        K63.5, Polyp of colon CPT copyright 2019 American Medical Association. All rights reserved. The codes documented in this report are preliminary and upon coder review may  be revised to meet current compliance requirements. Dr. Ulyess Mort Lin Landsman MD, MD 12/23/2021 1:30:49 PM This report has been signed electronically. Number of Addenda: 0 Note Initiated On: 12/23/2021 12:53 PM Scope Withdrawal Time: 0 hours 11 minutes 3 seconds  Total Procedure Duration: 0 hours 13 minutes 59 seconds  Estimated Blood Loss:  Estimated blood loss: none. Estimated blood loss: none.      [place]  [facility]

## 2021-12-23 NOTE — Transfer of Care (Signed)
Immediate Anesthesia Transfer of Care Note  Patient: Cassidy Norris  Procedure(s) Performed: Procedure(s): COLONOSCOPY WITH PROPOFOL (N/A) ESOPHAGOGASTRODUODENOSCOPY (EGD) (N/A)  Patient Location: PACU and Endoscopy Unit  Anesthesia Type:General  Level of Consciousness: sedated  Airway & Oxygen Therapy: Patient Spontanous Breathing and Patient connected to nasal cannula oxygen  Post-op Assessment: Report given to RN and Post -op Vital signs reviewed and stable  Post vital signs: Reviewed and stable  Last Vitals:  Vitals:   12/23/21 1049  BP: 137/77  Pulse: (!) 114  Resp: 18  Temp: (!) 35.9 C  SpO2: 04%    Complications: No apparent anesthesia complications

## 2021-12-23 NOTE — Anesthesia Procedure Notes (Signed)
Date/Time: 12/23/2021 1:00 PM Performed by: Doreen Salvage, CRNA Pre-anesthesia Checklist: Patient identified, Emergency Drugs available, Suction available and Patient being monitored Patient Re-evaluated:Patient Re-evaluated prior to induction Oxygen Delivery Method: Supernova nasal CPAP Induction Type: IV induction Dental Injury: Teeth and Oropharynx as per pre-operative assessment  Comments: Nasal cannula with etCO2 monitoring

## 2021-12-23 NOTE — H&P (Signed)
Cephas Darby, MD [address]  [place]  [place], [place] [address]  Main: [phone]  Fax: [phone] Pager: [phone]  Primary Care Physician:  [name], DO Primary Gastroenterologist:  Dr. Cephas Darby  Pre-Procedure History & Physical: HPI:  Cassidy Norris is a 71 y.o. female is here for an endoscopy and colonoscopy.   Past Medical History:  Diagnosis Date   Anemia    Arthritis not really diagnosis but have pain   B12 deficiency 07/23/2016   Will check levels to determine if injections are needed again. Await results. Recent CBC at [facility]   Blood transfusion without reported diagnosis 1983 had a miscarriage/dnc   Cancer ([place]) 12/30/2013   Multifocal disease: pT1c,m, N0(isolated tumor cells) Er/ PR positive, Her 2 negative.   Cancer ([place]) 01/09/2014   Invasive lobular carcinoma, 2.2 cm, T2,N1 ER/PR positive, HER-2/neu negative   Cataract 2014?   Centrilobular emphysema ([place]) 10/27/2021   Diabetes mellitus without complication ([place])    Diffuse cystic mastopathy    Essential hypertension, benign 09/24/2017   Family history of malignant neoplasm of gastrointestinal tract 2012   Fractured elbow 08/28/2014   Gastroesophageal reflux disease    Hx of metabolic acidosis with increased anion gap    Increased heart rate    Multiple sclerosis (HCC)    Neuromuscular disorder (HCC)    neuropathy in bil feet - left is worse.   Obesity, unspecified    Peripheral neuropathy    Personal history of tobacco use, presenting hazards to health    Restless leg syndrome    Sleep apnea    uses CPAP   Special screening for malignant neoplasms, colon     Past Surgical History:  Procedure Laterality Date   BREAST BIOPSY Right 1973,2001   BREAST BIOPSY Right 11-07-13   INVASIVE MAMMARY CARCINOMA , ER/PR positive Her2 negative   BREAST BIOPSY Left 11-27-13   invasive lobular and DCIS   BREAST SURGERY Bilateral 01/09/14   mastectomy   CHOLECYSTECTOMY      COLONOSCOPY  2010   Dr. Jamal Collin   COLONOSCOPY WITH PROPOFOL N/A 06/11/2015   Procedure: COLONOSCOPY WITH PROPOFOL;  Surgeon: Christene Lye, MD;  Location: [facility] ENDOSCOPY;  Service: Endoscopy;  Laterality: N/A;   DILATATION & CURETTAGE/HYSTEROSCOPY WITH MYOSURE N/A 07/12/2018   Procedure: DILATATION & CURETTAGE/HYSTEROSCOPY WITH MYOSURE WITH ENDOMETRIAL POLYPECTOMY;  Surgeon: Will Bonnet, MD;  Location: [facility] ORS;  Service: Gynecology;  Laterality: N/A;   DILATION AND CURETTAGE OF UTERUS  1983   JOINT REPLACEMENT  11/23/2015   POLYPECTOMY  1989   PORTA CATH REMOVAL     PORTACATH PLACEMENT  2015   SPINE SURGERY  09/14/14   Ruptured disk L4- L5   TOTAL KNEE ARTHROPLASTY Left 11/22/2015   Procedure: TOTAL KNEE ARTHROPLASTY;  Surgeon: Earlie Server, MD;  Location: [place];  Service: Orthopedics;  Laterality: Left;   TUBAL LIGATION     WISDOM TOOTH EXTRACTION  2006    Prior to Admission medications   Medication Sig Start Date End Date Taking? Authorizing Provider  acetaminophen (TYLENOL) 500 MG tablet Take 1,000 mg by mouth 2 (two) times daily as needed for moderate pain or headache.    [provider]  baclofen (LIORESAL) 10 MG tablet Take 1 tablet (10 mg total) by mouth 3 (three) times daily as needed for muscle spasms. 10/27/21   Pieter Partridge, DO  calcium carbonate (OSCAL) 1500 (600 Ca) MG TABS tablet Take by mouth 2 (two)  times daily with a meal.    [provider]  diclofenac Sodium (VOLTAREN) 1 % GEL Apply 2 g topically 3 (three) times daily as needed. 04/19/20   Karamalegos, [name], DO  famotidine (PEPCID) 20 MG tablet TAKE 1 TABLET BY MOUTH TWICE A DAY BEFORE MEALS 12/07/21   [name], [name], DO  fluticasone [facility]) 50 MCG/ACT nasal spray PLACE 2 SPRAYS INTO BOTH NOSTRILS AT BEDTIME. 10/24/18   Karamalegos, [name], DO  Krill Oil 1000 MG CAPS Take by mouth.    [provider]  lisinopril (ZESTRIL) 5 MG tablet TAKE 1 TABLET BY MOUTH  EVERY EVENING 04/09/21   Karamalegos, [name], DO  Melatonin 10 MG CAPS Take by mouth. Patient not taking: Reported on 12/09/2021    [provider]  metFORMIN (GLUCOPHAGE) 500 MG tablet Take 1 tablet (500 mg total) by mouth 2 (two) times daily with a meal. 04/23/21   Karamalegos, [name], DO  polyethylene glycol powder (GLYCOLAX/MIRALAX) powder Take 17 g by mouth 2 (two) times daily as needed for moderate constipation. 01/20/18   Mikey College, NP  pregabalin (LYRICA) 25 MG capsule Take 1 capsule (25 mg total) by mouth 2 (two) times daily. Patient not taking: Reported on 12/09/2021 10/27/21   [name], DO  traZODone (DESYREL) 100 MG tablet Take 1 tablet (100 mg total) by mouth at bedtime. 10/27/21   [name], [name], DO  venlafaxine XR (EFFEXOR-XR) 75 MG 24 hr capsule Take 1 capsule (75 mg total) by mouth daily with breakfast. 10/27/21   [name], [name], DO  vitamin B-12 (CYANOCOBALAMIN) 1000 MCG tablet Take 2,500 mcg by mouth daily.    [provider]  Vitamin D, Cholecalciferol, 50 MCG (2000 UT) CAPS Take 1 capsule by mouth every 3 (three) days.    [provider]    Allergies as of 11/14/2021   (No Known Allergies)    Family History  Problem Relation Age of Onset   Cancer Mother        lung   COPD Mother    Cancer Father        colon, stomach   Diabetes Maternal Grandmother    Hypertension Sister    Rectal cancer Paternal Uncle    Rectal cancer Paternal Uncle     Social History   Socioeconomic History   Marital status: Widowed    Spouse name: Not on file   Number of children: Not on file   Years of education: some college   Highest education level: High school graduate  Occupational History   Occupation: retired  Tobacco Use   Smoking status: Former    Packs/day: 1.00    Years: 30.00    Pack years: 30.00    Types: Cigarettes    Quit date: 12/11/2013    Years since quitting: 8.0   Smokeless tobacco:  Former  Scientific laboratory technician Use: Never used  Substance and Sexual Activity   Alcohol use: Not Currently   Drug use: No   Sexual activity: Not Currently    Birth control/protection: Post-menopausal  Other Topics Concern   Not on file  Social History Narrative   Not on file   Social Determinants of Health   Financial Resource Strain: Not on file  Food Insecurity: Not on file  Transportation Needs: Not on file  Physical Activity: Not on file  Stress: Not on file  Social Connections: Not on file  Intimate Partner Violence: Not on file  Review of Systems: See HPI, otherwise negative ROS  Physical Exam: BP 137/77    Pulse (!) 114    Temp (!) 96.6 F (35.9 C) (Temporal)    Resp 18    Ht 5' 4"  (1.626 m)    Wt 91.6 kg    LMP 12/22/1999 (Approximate)    SpO2 99%    BMI 34.67 kg/m  General:   Alert,  pleasant and cooperative in NAD Head:  Normocephalic and atraumatic. Neck:  Supple; no masses or thyromegaly. Lungs:  Clear throughout to auscultation.    Heart:  Regular rate and rhythm. Abdomen:  Soft, nontender and nondistended. Normal bowel sounds, without guarding, and without rebound.   Neurologic:  Alert and  oriented x4;  grossly normal neurologically.  Impression/Plan: Cassidy Norris is here for an endoscopy and colonoscopy to be performed for dysphagia and Personal history of colon polyps and family history of colorectal cancer  Risks, benefits, limitations, and alternatives regarding  endoscopy and colonoscopy have been reviewed with the patient.  Questions have been answered.  All parties agreeable.   Sherri Sear, MD  12/23/2021, 11:11 AM

## 2021-12-23 NOTE — Op Note (Signed)
[facility] Patient Name: Cassidy Norris Procedure Date: 12/23/2021 12:55 PM MRN: [id number] Account #: [id number] Date of Birth: 1950-12-25 Admit Type: Outpatient Age: 71 Room: [facility] ENDO ROOM 3 Gender: Female Note Status: Finalized Instrument Name: Upper Endoscope [id number] Procedure:             Upper GI endoscopy Indications:           Esophageal dysphagia Providers:             Lin Landsman MD, MD Referring MD:          [name] (Referring MD) Medicines:             General Anesthesia Complications:         No immediate complications. Estimated blood loss: None. Procedure:             Pre-Anesthesia Assessment:                        - Prior to the procedure, a History and Physical was                         performed, and patient medications and allergies were                         reviewed. The patient is competent. The risks and                         benefits of the procedure and the sedation options and                         risks were discussed with the patient. All questions                         were answered and informed consent was obtained.                         Patient identification and proposed procedure were                         verified by the physician, the nurse, the                         anesthesiologist, the anesthetist and the technician                         in the pre-procedure area in the procedure room in the                         endoscopy suite. Mental Status Examination: alert and                         oriented. Airway Examination: normal oropharyngeal                         airway and neck mobility. Respiratory Examination:                         clear to auscultation. CV Examination: normal.  Prophylactic Antibiotics: The patient does not require                         prophylactic antibiotics. Prior Anticoagulants: The                          patient has taken no previous anticoagulant or                         antiplatelet agents. ASA Grade Assessment: III - A                         patient with severe systemic disease. After reviewing                         the risks and benefits, the patient was deemed in                         satisfactory condition to undergo the procedure. The                         anesthesia plan was to use general anesthesia.                         Immediately prior to administration of medications,                         the patient was re-assessed for adequacy to receive                         sedatives. The heart rate, respiratory rate, oxygen                         saturations, blood pressure, adequacy of pulmonary                         ventilation, and response to care were monitored                         throughout the procedure. The physical status of the                         patient was re-assessed after the procedure.                        After obtaining informed consent, the endoscope was                         passed under direct vision. Throughout the procedure,                         the patient's blood pressure, pulse, and oxygen                         saturations were monitored continuously. The Endoscope                         was introduced through the mouth, and advanced to the  second part of duodenum. The upper GI endoscopy was                         accomplished without difficulty. The patient tolerated                         the procedure well. Findings:      The duodenal bulb and second portion of the duodenum were normal.      A few dispersed diminutive erosions with stigmata of recent bleeding       were found in the gastric antrum.      The cardia and gastric fundus were normal on retroflexion.      Esophagogastric landmarks were identified: the gastroesophageal junction       was found at 35 cm from the incisors.      LA Grade B  (one or more mucosal breaks greater than 5 mm, not extending       between the tops of two mucosal folds) esophagitis with no bleeding was       found at the gastroesophageal junction. Impression:            - Normal duodenal bulb and second portion of the                         duodenum.                        - Erosive gastropathy with stigmata of recent bleeding.                        - Esophagogastric landmarks identified.                        - LA Grade B reflux esophagitis with no bleeding.                        - No specimens collected. Recommendation:        - Follow an antireflux regimen.                        - Use Prilosec (omeprazole) 40 mg PO BID for 3 months.                        - Proceed with colonoscopy as scheduled                        See colonoscopy report Procedure Code(s):     --- Professional ---                        [phone], Esophagogastroduodenoscopy, flexible,                         transoral; diagnostic, including collection of                         specimen(s) by brushing or washing, when performed                         (separate procedure) Diagnosis Code(s):     --- Professional ---  K92.2, Gastrointestinal hemorrhage, unspecified                        K21.00, Gastro-esophageal reflux disease with                         esophagitis, without bleeding                        R13.14, Dysphagia, pharyngoesophageal phase CPT copyright 2019 American Medical Association. All rights reserved. The codes documented in this report are preliminary and upon coder review may  be revised to meet current compliance requirements. Dr. Ulyess Mort Lin Landsman MD, MD 12/23/2021 1:10:31 PM This report has been signed electronically. Number of Addenda: 0 Note Initiated On: 12/23/2021 12:55 PM Estimated Blood Loss:  Estimated blood loss: none.      [facility]

## 2021-12-24 ENCOUNTER — Encounter: Payer: Self-pay | Admitting: Gastroenterology

## 2021-12-24 LAB — SURGICAL PATHOLOGY

## 2021-12-25 ENCOUNTER — Ambulatory Visit
Admission: RE | Admit: 2021-12-25 | Discharge: 2021-12-25 | Disposition: A | Discharge status: Home / Self Care | Payer: Medicare Other | Source: Ambulatory Visit | Attending: Oncology | Admitting: Oncology

## 2021-12-25 ENCOUNTER — Telehealth: Payer: Self-pay | Admitting: Neurology

## 2021-12-25 ENCOUNTER — Other Ambulatory Visit: Payer: Self-pay

## 2021-12-25 DIAGNOSIS — R590 Localized enlarged lymph nodes: Secondary | ICD-10-CM | POA: Diagnosis not present

## 2021-12-25 DIAGNOSIS — I700 Atherosclerosis of aorta: Secondary | ICD-10-CM | POA: Insufficient documentation

## 2021-12-25 DIAGNOSIS — N281 Cyst of kidney, acquired: Secondary | ICD-10-CM | POA: Diagnosis not present

## 2021-12-25 DIAGNOSIS — Z853 Personal history of malignant neoplasm of breast: Secondary | ICD-10-CM | POA: Insufficient documentation

## 2021-12-25 DIAGNOSIS — C773 Secondary and unspecified malignant neoplasm of axilla and upper limb lymph nodes: Secondary | ICD-10-CM | POA: Diagnosis not present

## 2021-12-25 DIAGNOSIS — J439 Emphysema, unspecified: Secondary | ICD-10-CM | POA: Diagnosis not present

## 2021-12-25 DIAGNOSIS — K760 Fatty (change of) liver, not elsewhere classified: Secondary | ICD-10-CM | POA: Insufficient documentation

## 2021-12-25 LAB — GLUCOSE, CAPILLARY: Glucose-Capillary: 114 mg/dL — ABNORMAL HIGH (ref 70–99)

## 2021-12-25 MED ORDER — FLUDEOXYGLUCOSE F - 18 (FDG) INJECTION
10.5000 | Freq: Once | INTRAVENOUS | Status: AC | PRN
  Administered 2021-12-25: 10.7 via INTRAVENOUS

## 2021-12-25 NOTE — Progress Notes (Signed)
Tried calling pt, No answer. LMOVM to call the office back.

## 2021-12-25 NOTE — Telephone Encounter (Signed)
Patient called stating she received a voicemail that asked her to give the office a call back.  She was returning the call but there are no notes in her chart stating that anyone called her.

## 2021-12-26 NOTE — Telephone Encounter (Signed)
See results note. 

## 2021-12-26 NOTE — Progress Notes (Signed)
Pt advised of her lab results.

## 2021-12-29 ENCOUNTER — Telehealth: Payer: Self-pay | Admitting: Neurology

## 2021-12-29 NOTE — Telephone Encounter (Signed)
Pt called  stating she had a PET scan on 12/25/21 that showed lesions on the spine. She wants Dr. Tomi Likens to see the results before her appointment on 01/01/22.

## 2021-12-30 DIAGNOSIS — C50919 Malignant neoplasm of unspecified site of unspecified female breast: Secondary | ICD-10-CM | POA: Insufficient documentation

## 2021-12-30 NOTE — Progress Notes (Signed)
[place]  [phone]  [phone]     Cassidy Norris DOB: 14-Nov-1951  MR#: [id number]  YOV#:[id number]  Patient Care Team: [name], DO as PCP - General (Family Medicine) Christene Lye, MD (General Surgery) Forest Gleason, MD (Oncology) Pieter Partridge, DO as Consulting Physician (Neurology)  CHIEF COMPLAINT: Recurrent, metastatic ER/PR, HER2 negative invasive carcinoma of the breast.    INTERVAL HISTORY: Patient returns to clinic today for further evaluation, discussion of her imaging and pathology results, and treatment planning.  She continues to have mild chest wall discomfort, but otherwise feels well.  She has no new neurologic complaints.  She denies any recent fevers or illnesses. She has a good appetite and denies weight loss.  She has no chest pain, shortness of breath, cough, or hemoptysis.  She denies any nausea, vomiting, constipation, or diarrhea. She has no urinary complaints.  Patient offers no further specific complaints today.  REVIEW OF SYSTEMS:   Review of Systems  Constitutional: Negative.  Negative for fever, malaise/fatigue and weight loss.  Respiratory: Negative.  Negative for cough, hemoptysis and shortness of breath.   Cardiovascular: Negative.  Negative for chest pain and leg swelling.  Gastrointestinal: Negative.  Negative for abdominal pain.  Genitourinary: Negative.  Negative for dysuria.  Musculoskeletal:  Positive for joint pain. Negative for myalgias.  Skin: Negative.  Negative for rash.  Neurological:  Positive for headaches. Negative for dizziness, sensory change, focal weakness and weakness.  Psychiatric/Behavioral: Negative.  The patient is not nervous/anxious.    As per HPI. Otherwise, a complete review of systems is negative.  ONCOLOGY HISTORY: Oncology History Overview Note  1. Bilateral carcinoma of breast, February, 2015. Right breast status post radical mastectomy. T1 C. N0M0  (isolated tumor cells in one lymph node) multifocal invasive cancer. Stage IC, Left breast, Status post radical mastectomy, T2, N1, M0 tumor. Stage II, RIght breast. Both tumors are estrogen receptor positive.  Progesterone receptor positive.  HER-2 receptor negative by FISH. Negative for BRCA mutation.  2. Started on Adriamycin and Cytoxan on March 01, 2014. Last cycle of Cytoxan and Adriamycin on May 01, 2014. Patient has finished last chemotherapy on September 9. (12 th dose was omitted because of neuropathy) 3.  Taking tamoxifen.  Patient had a poor tolerance to letrozole.(October, 2015) 4.  Patient is taking letrozole since November of 2015 5.  May, 2016 Letrozole has been put on hold because of bony pains 6.  Started on Aromasin from July of 2016    History of bilateral breast cancer (Resolved)  11/29/2013 Initial Diagnosis   Breast cancer bilateral, multifocal ER/PR pos, Her 2 neg,.     PAST MEDICAL HISTORY: Past Medical History:  Diagnosis Date   Anemia    Arthritis not really diagnosis but have pain   B12 deficiency 07/23/2016   Will check levels to determine if injections are needed again. Await results. Recent CBC at Onc WNL   Blood transfusion without reported diagnosis 1983 had a miscarriage/dnc   Cancer ([place]) 12/30/2013   Multifocal disease: pT1c,m, N0(isolated tumor cells) Er/ PR positive, Her 2 negative.   Cancer ([place]) 01/09/2014   Invasive lobular carcinoma, 2.2 cm, T2,N1 ER/PR positive, HER-2/neu negative   Cataract 2014?   Centrilobular emphysema ([place]) 10/27/2021   Diabetes mellitus without complication ([place])    Diffuse cystic mastopathy    Erosive esophagitis    Essential hypertension, benign 09/24/2017   Family history of malignant neoplasm of gastrointestinal tract 2012  Fractured elbow 08/28/2014   Gastroesophageal reflux disease    Hx of metabolic acidosis with increased anion gap    Increased heart rate    Multiple sclerosis (HCC)    Neuromuscular  disorder (HCC)    neuropathy in bil feet - left is worse.   Obesity, unspecified    Peripheral neuropathy    Personal history of tobacco use, presenting hazards to health    Restless leg syndrome    Sleep apnea    uses CPAP   Special screening for malignant neoplasms, colon     PAST SURGICAL HISTORY: Past Surgical History:  Procedure Laterality Date   BREAST BIOPSY Right 1973,2001   BREAST BIOPSY Right 11-07-13   INVASIVE MAMMARY CARCINOMA , ER/PR positive Her2 negative   BREAST BIOPSY Left 11-27-13   invasive lobular and DCIS   BREAST SURGERY Bilateral 01/09/14   mastectomy   CHOLECYSTECTOMY     COLONOSCOPY  2010   Dr. Jamal Collin   COLONOSCOPY WITH PROPOFOL N/A 06/11/2015   Procedure: COLONOSCOPY WITH PROPOFOL;  Surgeon: Christene Lye, MD;  Location: [facility] ENDOSCOPY;  Service: Endoscopy;  Laterality: N/A;   COLONOSCOPY WITH PROPOFOL N/A 12/23/2021   Procedure: COLONOSCOPY WITH PROPOFOL;  Surgeon: Lin Landsman, MD;  Location: [facility] ENDOSCOPY;  Service: Gastroenterology;  Laterality: N/A;   DILATATION & CURETTAGE/HYSTEROSCOPY WITH MYOSURE N/A 07/12/2018   Procedure: DILATATION & CURETTAGE/HYSTEROSCOPY WITH MYOSURE WITH ENDOMETRIAL POLYPECTOMY;  Surgeon: Will Bonnet, MD;  Location: [facility] ORS;  Service: Gynecology;  Laterality: N/A;   DILATION AND CURETTAGE OF UTERUS  1983   ESOPHAGOGASTRODUODENOSCOPY N/A 12/23/2021   Procedure: ESOPHAGOGASTRODUODENOSCOPY (EGD);  Surgeon: Lin Landsman, MD;  Location: [facility] ENDOSCOPY;  Service: Gastroenterology;  Laterality: N/A;   JOINT REPLACEMENT  11/23/2015   POLYPECTOMY  1989   PORTA CATH REMOVAL     PORTACATH PLACEMENT  2015   SPINE SURGERY  09/14/14   Ruptured disk L4- L5   TOTAL KNEE ARTHROPLASTY Left 11/22/2015   Procedure: TOTAL KNEE ARTHROPLASTY;  Surgeon: Earlie Server, MD;  Location: [place];  Service: Orthopedics;  Laterality: Left;   TUBAL LIGATION     WISDOM TOOTH EXTRACTION  2006    FAMILY HISTORY Family History   Problem Relation Age of Onset   Cancer Mother        lung   COPD Mother    Cancer Father        colon, stomach   Diabetes Maternal Grandmother    Hypertension Sister    Rectal cancer Paternal Uncle    Rectal cancer Paternal Uncle     GYNECOLOGIC HISTORY:  Patient's last menstrual period was 12/22/1999 (approximate).     ADVANCED DIRECTIVES:    HEALTH MAINTENANCE: Social History   Tobacco Use   Smoking status: Former    Packs/day: 1.00    Years: 30.00    Pack years: 30.00    Types: Cigarettes    Quit date: 12/11/2013    Years since quitting: 8.0   Smokeless tobacco: Former  Scientific laboratory technician Use: Never used  Substance Use Topics   Alcohol use: Not Currently   Drug use: No     Colonoscopy:  PAP:  Bone density:  Mammogram:  No Known Allergies  Current Outpatient Medications  Medication Sig Dispense Refill   acetaminophen (TYLENOL) 500 MG tablet Take 1,000 mg by mouth 2 (two) times daily as needed for moderate pain or headache.     baclofen (LIORESAL) 10 MG tablet Take 1 tablet (10 mg  total) by mouth 3 (three) times daily as needed for muscle spasms. 1080 tablet 0   calcium carbonate (OSCAL) 1500 (600 Ca) MG TABS tablet Take by mouth 2 (two) times daily with a meal.     diclofenac Sodium (VOLTAREN) 1 % GEL Apply 2 g topically 3 (three) times daily as needed. 100 g 2   fluticasone (FLONASE) 50 MCG/ACT nasal spray PLACE 2 SPRAYS INTO BOTH NOSTRILS AT BEDTIME. 48 g 1   Krill Oil 1000 MG CAPS Take by mouth.     lisinopril (ZESTRIL) 5 MG tablet TAKE 1 TABLET BY MOUTH EVERY EVENING 90 tablet 3   Melatonin 10 MG CAPS Take by mouth.     metFORMIN (GLUCOPHAGE) 500 MG tablet Take 1 tablet (500 mg total) by mouth 2 (two) times daily with a meal. 180 tablet 3   omeprazole (PRILOSEC) 40 MG capsule Take 1 capsule (40 mg total) by mouth 2 (two) times daily before a meal. 60 capsule 3   polyethylene glycol powder (GLYCOLAX/MIRALAX) powder Take 17 g by mouth 2 (two) times daily  as needed for moderate constipation. 3350 g 1   ribociclib succ (KISQALI, 600 MG DOSE,) 200 MG Therapy Pack Take 3 tablets (600 mg total) by mouth daily. Take for 21 days on, 7 days off, repeat every 28 days. (Patient not taking: Reported on 01/01/2022) 63 tablet 1   traZODone (DESYREL) 100 MG tablet Take 1 tablet (100 mg total) by mouth at bedtime. 90 tablet 3   venlafaxine XR (EFFEXOR-XR) 75 MG 24 hr capsule Take 1 capsule (75 mg total) by mouth daily with breakfast. 360 capsule 0   vitamin B-12 (CYANOCOBALAMIN) 1000 MCG tablet Take 2,500 mcg by mouth daily.     Vitamin D, Cholecalciferol, 50 MCG (2000 UT) CAPS Take 1 capsule by mouth every 3 (three) days.     No current facility-administered medications for this visit.    OBJECTIVE: BP 136/76 (BP Location: Right Arm, Patient Position: Sitting)    Pulse 98    Temp 98.4 F (36.9 C) (Tympanic)    Resp 18    Wt 201 lb (91.2 kg)    LMP 12/22/1999 (Approximate)    BMI 34.50 kg/m    Body mass index is 34.5 kg/m.    ECOG FS:0 - Asymptomatic  General: Well-developed, well-nourished, no acute distress. Eyes: Pink conjunctiva, anicteric sclera. HEENT: Normocephalic, moist mucous membranes. Breast: Bilateral mastectomy. Lungs: No audible wheezing or coughing. Heart: Regular rate and rhythm. Abdomen: Soft, nontender, no obvious distention. Musculoskeletal: No edema, cyanosis, or clubbing. Neuro: Alert, answering all questions appropriately. Cranial nerves grossly intact. Skin: No rashes or petechiae noted. Psych: Normal affect.  LAB RESULTS:  No visits with results within 3 Day(s) from this visit.  Latest known visit with results is:  Hospital Outpatient Visit on 12/25/2021  Component Date Value Ref Range Status   Glucose-Capillary 12/25/2021 114 (H)  70 - 99 mg/dL Final   Glucose reference range applies only to samples taken after fasting for at least 8 hours.    STUDIES: No results found.  ONCOLOGY HISTORY:  Bilateral adenocarcinoma of  the breast. Patient is status post bilateral mastectomy in February 2015. She also received adjuvant chemotherapy with Adriamycin, Cytoxan, and Taxol completing treatment in approximately October 2015. She then initiated letrozole, but could not tolerate secondary to leg pain and was switched to Aromasin.  Patient then switched to tamoxifen in October 2019 secondary to cost.  Patient subsequently discontinued tamoxifen and did not complete the recommended  5 years of treatment.   ASSESSMENT: Recurrent, metastatic ER/PR, HER2 negative invasive carcinoma of the breast.   PLAN:    1. Recurrent, metastatic ER/PR, HER2 negative invasive carcinoma of the breast: See oncology history as above.  Biopsy confirmed recurrent disease.  PET scan results from December 26, 2021 reviewed independently and report as above with hypermetabolic left supraclavicular, subpectoral, and axillary lymphadenopathy consistent with nodal recurrence of patient's history of breast cancer.  No distant metastases were identified.  Her most recent CA 27-29 was reported 260.1.  Patient will proceed with treatment using Kisqali 600 mg daily for 21 days with 7 days off.  She will also receive fulvestrant once per month.  Return to clinic in 1 week to initiate treatment.   2. Postmenopausal: Patient's last bone mineral density was on August 01, 2016 and revealed a T-score of -1.2. Continue monitoring bone mineral density by PCP. 3.  Multiple sclerosis: Chronic.  Continue evaluation and treatment per neurology.  4.  Esophageal discomfort/mild dysphagia: Recent colonoscopy and EGD did not reveal any significant pathology.  I spent a total of 30 minutes reviewing chart data, face-to-face evaluation with the patient, counseling and coordination of care as detailed above.    Patient expressed understanding and was in agreement with this plan. She also understands that She can call clinic at any time with any questions, concerns, or complaints.    Breast cancer bilateral, multifocal ER/PR pos, Her 2 neg,.   Staging form: Breast, AJCC 7th Edition     Clinical: Stage IIB (T2, N1, M0) - Signed by Forest Gleason, MD on 04/22/2015   Lloyd Huger, MD   01/01/2022 10:28 AM

## 2021-12-31 ENCOUNTER — Encounter: Payer: Self-pay | Admitting: Oncology

## 2021-12-31 ENCOUNTER — Telehealth: Payer: Self-pay | Admitting: Pharmacist

## 2021-12-31 ENCOUNTER — Other Ambulatory Visit: Payer: Self-pay

## 2021-12-31 ENCOUNTER — Other Ambulatory Visit: Payer: Self-pay | Admitting: *Deleted

## 2021-12-31 ENCOUNTER — Other Ambulatory Visit (HOSPITAL_COMMUNITY): Payer: Self-pay

## 2021-12-31 ENCOUNTER — Inpatient Hospital Stay: Payer: Medicare Other | Attending: Oncology | Admitting: Oncology

## 2021-12-31 DIAGNOSIS — Z9013 Acquired absence of bilateral breasts and nipples: Secondary | ICD-10-CM | POA: Diagnosis not present

## 2021-12-31 DIAGNOSIS — Z9221 Personal history of antineoplastic chemotherapy: Secondary | ICD-10-CM | POA: Insufficient documentation

## 2021-12-31 DIAGNOSIS — C773 Secondary and unspecified malignant neoplasm of axilla and upper limb lymph nodes: Secondary | ICD-10-CM | POA: Insufficient documentation

## 2021-12-31 DIAGNOSIS — C50919 Malignant neoplasm of unspecified site of unspecified female breast: Secondary | ICD-10-CM

## 2021-12-31 DIAGNOSIS — G35 Multiple sclerosis: Secondary | ICD-10-CM | POA: Insufficient documentation

## 2021-12-31 DIAGNOSIS — Z853 Personal history of malignant neoplasm of breast: Secondary | ICD-10-CM | POA: Diagnosis not present

## 2021-12-31 DIAGNOSIS — Z79899 Other long term (current) drug therapy: Secondary | ICD-10-CM | POA: Diagnosis not present

## 2021-12-31 DIAGNOSIS — Z171 Estrogen receptor negative status [ER-]: Secondary | ICD-10-CM | POA: Insufficient documentation

## 2021-12-31 MED ORDER — RIBOCICLIB SUCC (600 MG DOSE) 200 MG PO TBPK
600.0000 mg | ORAL_TABLET | Freq: Every day | ORAL | 1 refills | Status: DC
  Filled 2021-12-31: qty 63, 21d supply, fill #0
  Filled 2022-01-01: qty 63, 28d supply, fill #0

## 2021-12-31 NOTE — Telephone Encounter (Signed)
Oral Oncology Pharmacist Encounter   Received notification from [facility] that prior authorization for Thelma Comp is required.   PA submitted on CMM Key [id number] Status is pending   Oral Oncology Clinic will continue to follow.   Darl Pikes, PharmD, BCPS, [facility] Hematology/Oncology Clinical Pharmacist [facility] [place] [facility] [phone]  12/31/2021 3:48 PM

## 2021-12-31 NOTE — Progress Notes (Signed)
START ON PATHWAY REGIMEN - Breast     Cycle 1: A cycle is 28 days:     Ribociclib      Fulvestrant    Cycles 2 and beyond: A cycle is every 28 days:     Ribociclib      Fulvestrant   **Always confirm dose/schedule in your pharmacy ordering system**  Patient Characteristics: Distant Metastases or Locoregional Recurrent Disease - Unresected or Locally Advanced Unresectable Disease Progressing after Neoadjuvant and Local Therapies, HER2 Low/Negative/Unknown, ER Positive, Endocrine Therapy, Postmenopausal, Second Line,  Candidate for a CDK4/6 Inhibitor Therapeutic Status: Distant Metastases HER2 Status: Negative (-) ER Status: Positive (+) PR Status: Positive (+) Therapy Approach Indicated: Standard Chemotherapy/Endocrine Therapy Menopausal Status: Postmenopausal Line of Therapy: Second Line Intent of Therapy: Non-Curative / Palliative Intent, Discussed with Patient

## 2021-12-31 NOTE — Progress Notes (Signed)
NEUROLOGY FOLLOW UP OFFICE NOTE  Cassidy Norris [id number]  Assessment/Plan:   Multiple sclerosis, stable Diabetic polyneuropathy vs chemotherapy   1 D3 2000 IU three days weekly 2 Baclofen 75m TID as needed muscle cramps 3 one year  Subjective:  Cassidy KROBOTHis a 71year old right-handed woman with multiple sclerosis, type 2 diabetes, former smoker and history of bilateral breast cancer 2015 s/p bilateral mastectomy and s/p L4-5 laminectomy who follows up for multiple sclerosis   UPDATE: Current disease modifying therapy:  none Other medications:  baclofen 19mtwice daily; D3 2000 IU three times a week   Vit D level from 10/20/2021 was 64.   MRI of brain with and without contrast on 12/14/2021 personally reviewed again demonstrated stable chronic demyelination with no new or active lesions.    She was found to have lymphadenopathy on CT.  Oncology ordered a PET scan performed on 12/26/2021 which showed hypermetabolic left supraclavicular, subpectoral and axillary adenopathy consistent with nodal recurrence from breast cancer.  Plan is to restart treatment   Vision:  No change Motor:  No issues Sensory:  Neuropathy in feet, stable Pain:  Still having muscle cramps and neuropathic pain in legs at night.  Often wakes up in middle of the night with leg pain/cramps.  She is getting a lot of pain in the side of her right leg from the knee down to the ankle.  It feels like a cramp-like pain.  Not sharp or burning.  She does need to have a right knee replacement.  Needs surgery on left knee Gait:  unsteady Bowel/Bladder:  no Fatigue:  No.  Still having trouble sleeping despite trazodone.   Cognition:  Stable Mood:  Okay Directional vertigo to the left.   HISTORY: In November 2001, she tripped and fell, hitting her head.  She went to the ED where a CT of the head was reportedly unremarkable.  She was advised to have an MRI of the brain, which was performed on 12/12/00.  It  revealed small round and elliptical white matter lesions less than 1 cm in the periventricular region.  In December of 2002, she followed up with a neurologist, Dr. PoCorinna Capra VEP performed in 2003 showed prolonged p100 latency in the right optic nerve.  BAER was normal.  She reportedly had a lumbar puncture where CSF results were supportive of MS.  She was given 5 days of Solu-Medrol followed by 3 weeks of oral prednisone.  She was advised to start an interferon, but due to possible side effects, she decided to hold off and see if she clinically progresses.  She did have a follow up MRI of the brain in December 2004, which reportedly showed an enhancing lesion as well as a remote lesion in the cerebellum in addition to the periventricular white matter lesions.  She never had any noticeable clinical flare-ups and had done well off of a disease-modifying agent.  Over the past few years, she notes possible worsening in depth perception.  Sometimes she notes difficulty grabbing for objects or will drive too close to the curb.   Imaging: 03/12/2015 MRI BRAIN & CERVICAL SPINE W WO: non-enhancing bilateral periventricular white matter lesions oriented perpendicular to the ventricles and corpus callosum, as well as involving temporal lobe white matter and mild involvement of the left side of cerebellum.  No cervical cord lesions.  01/28/2017 MRI BRAIN W WO:  two small new or increased right hemisphere subcortical white matter lesions when compared  to 2016 with no enhancement or active demyelination.  Otherwise, MRI is stable.   01/25/2018 MRI BRAIN W WO:  stable chronic white matter hyperintensities consistent with MS.  08/10/2019 MRI BRAIN W WO:  Multiple T2/FLAIR hyperintense foci without enhancement, stable compared to imaging from 03/27/2018. 10/01/2020 MRI BRAIN W WO:  stable compared to imaging from 08/10/2019   In 2015, she was diagnosed with bilateral breast cancer.  She underwent bilateral mastectomy  and chemotherapy, which ended in September.  As a result of the chemotherapy, she has neuropathy in the feet.  She also had problems with her left leg and was found to have a ruptured disc at L4-5 and underwent surgery in October 2015.  She has baseline numbness over the dorsum of left foot and lateral leg.  The toes in her left foot sometimes curl.  Since chemotherapy, she notes some mild memory problems.  She sometimes misplaces objects or will forget if she locked the front door when she left.  She will sometimes have difficulty getting a word out.   Past DMT:  Tecfidera (stopped in early 2020.  She didn't feel well on it - slurred speech, worse balance.  Also, she was tired with fighting with the insurance company to get it)  PAST MEDICAL HISTORY: Past Medical History:  Diagnosis Date   Anemia    Arthritis not really diagnosis but have pain   B12 deficiency 07/23/2016   Will check levels to determine if injections are needed again. Await results. Recent CBC at [facility]   Blood transfusion without reported diagnosis 1983 had a miscarriage/dnc   Cancer ([facility]) 12/30/2013   Multifocal disease: pT1c,m, N0(isolated tumor cells) Er/ PR positive, Her 2 negative.   Cancer ([facility]) 01/09/2014   Invasive lobular carcinoma, 2.2 cm, T2,N1 ER/PR positive, HER-2/neu negative   Cataract 2014?   Centrilobular emphysema ([facility]) 10/27/2021   Diabetes mellitus without complication ([facility])    Diffuse cystic mastopathy    Erosive esophagitis    Essential hypertension, benign 09/24/2017   Family history of malignant neoplasm of gastrointestinal tract 2012   Fractured elbow 08/28/2014   Gastroesophageal reflux disease    Hx of metabolic acidosis with increased anion gap    Increased heart rate    Multiple sclerosis ([facility])    Neuromuscular disorder ([facility])    neuropathy in bil feet - left is worse.   Obesity, unspecified    Peripheral neuropathy    Personal history of tobacco use, presenting hazards to health     Restless leg syndrome    Sleep apnea    uses CPAP   Special screening for malignant neoplasms, colon     MEDICATIONS: Current Outpatient Medications on File Prior to Visit  Medication Sig Dispense Refill   acetaminophen (TYLENOL) 500 MG tablet Take 1,000 mg by mouth 2 (two) times daily as needed for moderate pain or headache.     baclofen (LIORESAL) 10 MG tablet Take 1 tablet (10 mg total) by mouth 3 (three) times daily as needed for muscle spasms. 90 tablet 1   calcium carbonate (OSCAL) 1500 (600 Ca) MG TABS tablet Take by mouth 2 (two) times daily with a meal.     diclofenac Sodium (VOLTAREN) 1 % GEL Apply 2 g topically 3 (three) times daily as needed. 100 g 2   fluticasone (FLONASE) 50 MCG/ACT nasal spray PLACE 2 SPRAYS INTO BOTH NOSTRILS AT BEDTIME. 48 g 1   Krill Oil 1000 MG CAPS Take by mouth.     lisinopril (  ZESTRIL) 5 MG tablet TAKE 1 TABLET BY MOUTH EVERY EVENING 90 tablet 3   Melatonin 10 MG CAPS Take by mouth.     metFORMIN (GLUCOPHAGE) 500 MG tablet Take 1 tablet (500 mg total) by mouth 2 (two) times daily with a meal. 180 tablet 3   omeprazole (PRILOSEC) 40 MG capsule Take 1 capsule (40 mg total) by mouth 2 (two) times daily before a meal. 60 capsule 3   polyethylene glycol powder (GLYCOLAX/MIRALAX) powder Take 17 g by mouth 2 (two) times daily as needed for moderate constipation. 3350 g 1   pregabalin (LYRICA) 25 MG capsule Take 1 capsule (25 mg total) by mouth 2 (two) times daily. 60 capsule 2   traZODone (DESYREL) 100 MG tablet Take 1 tablet (100 mg total) by mouth at bedtime. 90 tablet 3   venlafaxine XR (EFFEXOR-XR) 75 MG 24 hr capsule Take 1 capsule (75 mg total) by mouth daily with breakfast. 360 capsule 0   vitamin B-12 (CYANOCOBALAMIN) 1000 MCG tablet Take 2,500 mcg by mouth daily.     Vitamin D, Cholecalciferol, 50 MCG (2000 UT) CAPS Take 1 capsule by mouth every 3 (three) days.     No current facility-administered medications on file prior to visit.     ALLERGIES: No Known Allergies  FAMILY HISTORY: Family History  Problem Relation Age of Onset   Cancer Mother        lung   COPD Mother    Cancer Father        colon, stomach   Diabetes Maternal Grandmother    Hypertension Sister    Rectal cancer Paternal Uncle    Rectal cancer Paternal Uncle       Objective:  Blood pressure 127/79, pulse 100, resp. rate 20, height 5' 4.5" (1.638 m), weight 202 lb (91.6 kg), last menstrual period 12/22/1999, SpO2 98 %. General: No acute distress.  Patient appears well-groomed.   Head:  Normocephalic/atraumatic Eyes:  Fundi examined but not visualized Neck: supple, no paraspinal tenderness, full range of motion Heart:  Regular rate and rhythm Lungs:  Clear to auscultation bilaterally Back: No paraspinal tenderness Neurological Exam: alert and oriented to person, place, and time.  Speech fluent and not dysarthric, language intact.  CN II-XII intact. Bulk and tone normal, muscle strength 5/5 throughout.  Sensation to pinprick and vibration reduced in feet.  Deep tendon reflexes trace throughout, toes downgoing.  Finger to nose testing intact.  Antalgic broad-based gait.  Romberg positive   Metta Clines, DO  CC: Nobie Putnam, DO

## 2021-12-31 NOTE — Progress Notes (Signed)
Pt here for follow up. No new concerns at this time.

## 2021-12-31 NOTE — Telephone Encounter (Signed)
Oral Oncology Pharmacist Encounter  Received new prescription for Kisqali (ribociclib) for the treatment of recurrent breast cancer, ER/PR positive/ HER2 negative in conjunction with fulvestrant, planned duration until disease progression or unacceptable drug toxicity.  CMP/CBC from 10/20/21 assessed, no relevant lab abnormalities. Patient will have labs rechecked next week prior to treatment initiation. Prescription dose and frequency assessed.   Patient will need ECGs at baseline (to be checked next week prior to treatment initiation), at day 14 of the first cycle, at the start of the second cycle, then as clinically indicated  Current medication list in Epic reviewed, one relevant DDIs with ribociclib identified: Trazodone: ribociclib may increase the serum concentration of trazodone. The recommendation is to consider the use of a lower trazodone dose and monitor for increased trazodone effects (eg, sedation, QTc prolongation). Recommend reducing her dose of trazodone to 36m with the initiation of ribociclib.   Evaluated chart and no patient barriers to medication adherence identified.   [facility].  Oral Oncology Clinic will continue to follow for insurance authorization, copayment issues, initial counseling and start date.  Patient agreed to treatment on 12/31/21 per MD documentation.  ADarl Pikes PharmD, BCPS, BCOP, CPP Hematology/Oncology Clinical Pharmacist Practitioner [place]/DB/AP [phone] 12/31/2021 11:18 AM

## 2022-01-01 ENCOUNTER — Ambulatory Visit (INDEPENDENT_AMBULATORY_CARE_PROVIDER_SITE_OTHER): Payer: Medicare Other | Admitting: Neurology

## 2022-01-01 ENCOUNTER — Other Ambulatory Visit (HOSPITAL_COMMUNITY): Payer: Self-pay

## 2022-01-01 ENCOUNTER — Encounter: Payer: Self-pay | Admitting: Oncology

## 2022-01-01 ENCOUNTER — Encounter: Payer: Self-pay | Admitting: Neurology

## 2022-01-01 VITALS — BP 127/79 | HR 100 | Resp 20 | Ht 64.5 in | Wt 202.0 lb

## 2022-01-01 DIAGNOSIS — G35 Multiple sclerosis: Secondary | ICD-10-CM | POA: Diagnosis not present

## 2022-01-01 DIAGNOSIS — G629 Polyneuropathy, unspecified: Secondary | ICD-10-CM

## 2022-01-01 MED ORDER — BACLOFEN 10 MG PO TABS: 10.0000 mg | ORAL_TABLET | Freq: Three times a day (TID) | ORAL | 0 refills | Status: DC | PRN

## 2022-01-01 NOTE — Telephone Encounter (Addendum)
Oral Chemotherapy Pharmacist Encounter   PA was denied. Appeal was faxed on on 01/01/22.   Received approval on 01/01/22. Authorization number: [id number] Approved: 01/01/22 until further notice  Approval letter placed to be scanned into the chart.   Darl Pikes, PharmD, BCPS, BCOP, CPP Hematology/Oncology Clinical Pharmacist [place]/DB/AP Oral [facility] [phone]  01/01/2022 3:41 PM

## 2022-01-01 NOTE — Patient Instructions (Signed)
Continue vitamin D3 2000 IU three times weekly  Continue baclofen 10mg  three times daily as needed Follow up one year

## 2022-01-02 ENCOUNTER — Other Ambulatory Visit (HOSPITAL_COMMUNITY): Payer: Self-pay

## 2022-01-02 MED ORDER — RIBOCICLIB SUCC (600 MG DOSE) 200 MG PO TBPK
600.0000 mg | ORAL_TABLET | Freq: Every day | ORAL | 0 refills | Status: DC
  Filled 2022-01-02: qty 63, 21d supply, fill #0

## 2022-01-02 NOTE — Addendum Note (Signed)
Addended by: Darl Pikes on: 01/02/2022 09:00 AM   Modules accepted: Orders

## 2022-01-05 ENCOUNTER — Other Ambulatory Visit (HOSPITAL_COMMUNITY): Payer: Self-pay

## 2022-01-05 ENCOUNTER — Encounter: Payer: Self-pay | Admitting: Oncology

## 2022-01-05 NOTE — Progress Notes (Signed)
[place]  [phone]) [phone]  Fax:([phone]) [place] DOB: 28-Oct-1951  MR#: [id number]  OJJ#:[id number]  Patient Care Team: [name], DO as PCP - General (Family Medicine) Christene Lye, MD (General Surgery) Forest Gleason, MD (Oncology) Pieter Partridge, DO as Consulting Physician (Neurology)  CHIEF COMPLAINT: Recurrent, metastatic ER/PR, HER2 negative invasive carcinoma of the breast.    INTERVAL HISTORY: Patient returns to clinic today for further evaluation and initiation of Kisqali and fulvestrant. She continues to have mild chest wall discomfort, but otherwise feels well.  She has no new neurologic complaints.  She denies any recent fevers or illnesses. She has a good appetite and denies weight loss.  She has no chest pain, shortness of breath, cough, or hemoptysis.  She denies any nausea, vomiting, constipation, or diarrhea. She has no urinary complaints.  Patient offers no further specific complaints today.  REVIEW OF SYSTEMS:   Review of Systems  Constitutional: Negative.  Negative for fever, malaise/fatigue and weight loss.  Respiratory: Negative.  Negative for cough, hemoptysis and shortness of breath.   Cardiovascular: Negative.  Negative for chest pain and leg swelling.  Gastrointestinal: Negative.  Negative for abdominal pain.  Genitourinary: Negative.  Negative for dysuria.  Musculoskeletal: Negative.  Negative for joint pain and myalgias.  Skin: Negative.  Negative for rash.  Neurological: Negative.  Negative for dizziness, sensory change, focal weakness, weakness and headaches.  Psychiatric/Behavioral: Negative.  The patient is not nervous/anxious.    As per HPI. Otherwise, a complete review of systems is negative.  ONCOLOGY HISTORY: Oncology History Overview Note  1. Bilateral carcinoma of breast, February, 2015. Right breast status post radical mastectomy. T1 C. N0M0 (isolated tumor cells in one lymph  node) multifocal invasive cancer. Stage IC, Left breast, Status post radical mastectomy, T2, N1, M0 tumor. Stage II, RIght breast. Both tumors are estrogen receptor positive.  Progesterone receptor positive.  HER-2 receptor negative by FISH. Negative for BRCA mutation.  2. Started on Adriamycin and Cytoxan on March 01, 2014. Last cycle of Cytoxan and Adriamycin on May 01, 2014. Patient has finished last chemotherapy on September 9. (12 th dose was omitted because of neuropathy) 3.  Taking tamoxifen.  Patient had a poor tolerance to letrozole.(October, 2015) 4.  Patient is taking letrozole since November of 2015 5.  May, 2016 Letrozole has been put on hold because of bony pains 6.  Started on Aromasin from July of 2016    History of bilateral breast cancer (Resolved)  11/29/2013 Initial Diagnosis   Breast cancer bilateral, multifocal ER/PR pos, Her 2 neg,.     PAST MEDICAL HISTORY: Past Medical History:  Diagnosis Date   Anemia    Arthritis not really diagnosis but have pain   B12 deficiency 07/23/2016   Will check levels to determine if injections are needed again. Await results. Recent CBC at Onc [facility]   Blood transfusion without reported diagnosis 1983 had a miscarriage/dnc   Cancer ([place]) 12/30/2013   Multifocal disease: pT1c,m, N0(isolated tumor cells) Er/ PR positive, Her 2 negative.   Cancer ([place]) 01/09/2014   Invasive lobular carcinoma, 2.2 cm, T2,N1 ER/PR positive, HER-2/neu negative   Cataract 2014?   Centrilobular emphysema ([place]) 10/27/2021   Diabetes mellitus without complication ([place])    Diffuse cystic mastopathy    Erosive esophagitis    Essential hypertension, benign 09/24/2017   Family history of malignant neoplasm of gastrointestinal tract 2012   Fractured elbow 08/28/2014   Gastroesophageal  reflux disease    Hx of metabolic acidosis with increased anion gap    Increased heart rate    Multiple sclerosis (HCC)    Neuromuscular disorder (HCC)    neuropathy in bil  feet - left is worse.   Obesity, unspecified    Peripheral neuropathy    Personal history of tobacco use, presenting hazards to health    Restless leg syndrome    Sleep apnea    uses CPAP   Special screening for malignant neoplasms, colon     PAST SURGICAL HISTORY: Past Surgical History:  Procedure Laterality Date   BREAST BIOPSY Right 1973,2001   BREAST BIOPSY Right 11-07-13   INVASIVE MAMMARY CARCINOMA , ER/PR positive Her2 negative   BREAST BIOPSY Left 11-27-13   invasive lobular and DCIS   BREAST SURGERY Bilateral 01/09/14   mastectomy   CHOLECYSTECTOMY     COLONOSCOPY  2010   Dr. Jamal Collin   COLONOSCOPY WITH PROPOFOL N/A 06/11/2015   Procedure: COLONOSCOPY WITH PROPOFOL;  Surgeon: Christene Lye, MD;  Location: [facility] ENDOSCOPY;  Service: Endoscopy;  Laterality: N/A;   COLONOSCOPY WITH PROPOFOL N/A 12/23/2021   Procedure: COLONOSCOPY WITH PROPOFOL;  Surgeon: Lin Landsman, MD;  Location: [facility] ENDOSCOPY;  Service: Gastroenterology;  Laterality: N/A;   DILATATION & CURETTAGE/HYSTEROSCOPY WITH MYOSURE N/A 07/12/2018   Procedure: DILATATION & CURETTAGE/HYSTEROSCOPY WITH MYOSURE WITH ENDOMETRIAL POLYPECTOMY;  Surgeon: Will Bonnet, MD;  Location: [facility] ORS;  Service: Gynecology;  Laterality: N/A;   DILATION AND CURETTAGE OF UTERUS  1983   ESOPHAGOGASTRODUODENOSCOPY N/A 12/23/2021   Procedure: ESOPHAGOGASTRODUODENOSCOPY (EGD);  Surgeon: Lin Landsman, MD;  Location[facility]COPY;  Service: Gastroenterology;  Laterality: N/A;   JOINT REPLACEMENT  11/23/2015   POLYPECTOMY  1989   PORTA CATH REMOVAL     PORTACATH PLACEMENT  2015   SPINE SURGERY  09/14/14   Ruptured disk L4- L5   TOTAL KNEE ARTHROPLASTY Left 11/22/2015   Procedure: TOTAL KNEE ARTHROPLASTY;  Surgeon: Earlie Server, MD;  Location: [place];  Service: Orthopedics;  Laterality: Left;   TUBAL LIGATION     WISDOM TOOTH EXTRACTION  2006    FAMILY HISTORY Family History  Problem Relation Age of Onset    Cancer Mother        lung   COPD Mother    Cancer Father        colon, stomach   Diabetes Maternal Grandmother    Hypertension Sister    Rectal cancer Paternal Uncle    Rectal cancer Paternal Uncle     GYNECOLOGIC HISTORY:  Patient's last menstrual period was 12/22/1999 (approximate).     ADVANCED DIRECTIVES:    HEALTH MAINTENANCE: Social History   Tobacco Use   Smoking status: Former    Packs/day: 1.00    Years: 30.00    Pack years: 30.00    Types: Cigarettes    Quit date: 12/11/2013    Years since quitting: 8.0   Smokeless tobacco: Former  Scientific laboratory technician Use: Never used  Substance Use Topics   Alcohol use: Not Currently   Drug use: No     Colonoscopy:  PAP:  Bone density:  Mammogram:  No Known Allergies  Current Outpatient Medications  Medication Sig Dispense Refill   acetaminophen (TYLENOL) 500 MG tablet Take 1,000 mg by mouth 2 (two) times daily as needed for moderate pain or headache.     baclofen (LIORESAL) 10 MG tablet Take 1 tablet (10 mg total) by mouth 3 (three) times  daily as needed for muscle spasms. 1080 tablet 0   calcium carbonate (OSCAL) 1500 (600 Ca) MG TABS tablet Take by mouth 2 (two) times daily with a meal.     diclofenac Sodium (VOLTAREN) 1 % GEL Apply 2 g topically 3 (three) times daily as needed. 100 g 2   fluticasone (FLONASE) 50 MCG/ACT nasal spray PLACE 2 SPRAYS INTO BOTH NOSTRILS AT BEDTIME. 48 g 1   Krill Oil 1000 MG CAPS Take by mouth.     lisinopril (ZESTRIL) 5 MG tablet TAKE 1 TABLET BY MOUTH EVERY EVENING 90 tablet 3   Melatonin 10 MG CAPS Take by mouth.     metFORMIN (GLUCOPHAGE) 500 MG tablet Take 1 tablet (500 mg total) by mouth 2 (two) times daily with a meal. 180 tablet 3   omeprazole (PRILOSEC) 40 MG capsule Take 1 capsule (40 mg total) by mouth 2 (two) times daily before a meal. 60 capsule 3   polyethylene glycol powder (GLYCOLAX/MIRALAX) powder Take 17 g by mouth 2 (two) times daily as needed for moderate  constipation. 3350 g 1   ribociclib succ (KISQALI, 600 MG DOSE,) 200 MG Therapy Pack Take 3 tablets (600 mg total) by mouth daily. Take for 21 days on, 7 days off, repeat every 28 days. 63 tablet 0   venlafaxine XR (EFFEXOR-XR) 75 MG 24 hr capsule Take 1 capsule (75 mg total) by mouth daily with breakfast. 360 capsule 0   vitamin B-12 (CYANOCOBALAMIN) 1000 MCG tablet Take 2,500 mcg by mouth daily.     Vitamin D, Cholecalciferol, 50 MCG (2000 UT) CAPS Take 1 capsule by mouth every 3 (three) days.     traZODone (DESYREL) 100 MG tablet Take 50 mg by mouth at bedtime.     No current facility-administered medications for this visit.    OBJECTIVE: BP 123/78    Pulse (!) 115    Temp 97.9 F (36.6 C) (Tympanic)    Wt 199 lb 11.2 oz (90.6 kg)    LMP 12/22/1999 (Approximate)    BMI 33.75 kg/m    Body mass index is 33.75 kg/m.    ECOG FS:0 - Asymptomatic  General: Well-developed, well-nourished, no acute distress. Eyes: Pink conjunctiva, anicteric sclera. HEENT: Normocephalic, moist mucous membranes. Breast: Bilateral mastectomy. Lungs: No audible wheezing or coughing. Heart: Regular rate and rhythm. Abdomen: Soft, nontender, no obvious distention. Musculoskeletal: No edema, cyanosis, or clubbing. Neuro: Alert, answering all questions appropriately. Cranial nerves grossly intact. Skin: No rashes or petechiae noted. Psych: Normal affect.  LAB RESULTS:  Appointment on 01/07/2022  Component Date Value Ref Range Status   Sodium 01/07/2022 135  135 - 145 mmol/L Final   Potassium 01/07/2022 3.9  3.5 - 5.1 mmol/L Final   Chloride 01/07/2022 99  98 - 111 mmol/L Final   CO2 01/07/2022 27  22 - 32 mmol/L Final   Glucose, Bld 01/07/2022 129 (H)  70 - 99 mg/dL Final   Glucose reference range applies only to samples taken after fasting for at least 8 hours.   BUN 01/07/2022 17  8 - 23 mg/dL Final   Creatinine, Ser 01/07/2022 0.87  0.44 - 1.00 mg/dL Final   Calcium 01/07/2022 10.6 (H)  8.9 - 10.3  mg/dL Final   Total Protein 01/07/2022 7.9  6.5 - 8.1 g/dL Final   Albumin 01/07/2022 4.1  3.5 - 5.0 g/dL Final   AST 01/07/2022 32  15 - 41 U/L Final   ALT 01/07/2022 25  0 - 44 U/L Final  Alkaline Phosphatase 01/07/2022 72  38 - 126 U/L Final   Total Bilirubin 01/07/2022 0.2 (L)  0.3 - 1.2 mg/dL Final   GFR, Estimated 01/07/2022 >60  >60 mL/min Final   Comment: (NOTE) Calculated using the CKD-EPI Creatinine Equation (2021)    Anion gap 01/07/2022 9  5 - 15 Final   Performed at [facility], [address] [place], [place] [address]   WBC 01/07/2022 9.2  4.0 - 10.5 K/uL Final   RBC 01/07/2022 4.22  3.87 - 5.11 MIL/uL Final   Hemoglobin 01/07/2022 13.1  12.0 - 15.0 g/dL Final   HCT 01/07/2022 40.7  36.0 - 46.0 % Final   MCV 01/07/2022 96.4  80.0 - 100.0 fL Final   MCH 01/07/2022 31.0  26.0 - 34.0 pg Final   MCHC 01/07/2022 32.2  30.0 - 36.0 g/dL Final   RDW 01/07/2022 13.3  11.5 - 15.5 % Final   Platelets 01/07/2022 288  150 - 400 K/uL Final   nRBC 01/07/2022 0.0  0.0 - 0.2 % Final   Neutrophils Relative % 01/07/2022 67  % Final   Neutro Abs 01/07/2022 6.1  1.7 - 7.7 K/uL Final   Lymphocytes Relative 01/07/2022 22  % Final   Lymphs Abs 01/07/2022 2.0  0.7 - 4.0 K/uL Final   Monocytes Relative 01/07/2022 8  % Final   Monocytes Absolute 01/07/2022 0.8  0.1 - 1.0 K/uL Final   Eosinophils Relative 01/07/2022 2  % Final   Eosinophils Absolute 01/07/2022 0.2  0.0 - 0.5 K/uL Final   Basophils Relative 01/07/2022 1  % Final   Basophils Absolute 01/07/2022 0.1  0.0 - 0.1 K/uL Final   Immature Granulocytes 01/07/2022 0  % Final   Abs Immature Granulocytes 01/07/2022 0.03  0.00 - 0.07 K/uL Final   Performed at [facility], [address] [place], [place] [address]    STUDIES: No results found.  ONCOLOGY HISTORY:  Bilateral adenocarcinoma of the breast. Patient is status post bilateral mastectomy in February 2015. She also received adjuvant chemotherapy with Adriamycin,  Cytoxan, and Taxol completing treatment in approximately October 2015. She then initiated letrozole, but could not tolerate secondary to leg pain and was switched to Aromasin.  Patient then switched to tamoxifen in October 2019 secondary to cost.  Patient subsequently discontinued tamoxifen and did not complete the recommended 5 years of treatment.   ASSESSMENT: Recurrent, metastatic ER/PR, HER2 negative invasive carcinoma of the breast.   PLAN:    1. Recurrent, metastatic ER/PR, HER2 negative invasive carcinoma of the breast: See oncology history as above.  Biopsy confirmed recurrent disease.  PET scan results from December 26, 2021 reviewed independently and report as above with hypermetabolic left supraclavicular, subpectoral, and axillary lymphadenopathy consistent with nodal recurrence of patient's history of breast cancer.  No distant metastases were identified.  Her most recent CA 27-29 was reported 260.1.  Patient will proceed with treatment using Kisqali 600 mg daily for 21 days with 7 days off.  She initially received fulvestrant every 2 weeks as a loading dose and then once per month thereafter.  Proceed with treatment today.  Return to clinic in 2 weeks for further evaluation and continuation of fulvestrant.  Appreciate clinical pharmacy input.   2. Postmenopausal: Patient's last bone mineral density was on August 01, 2016 and revealed a T-score of -1.2. Continue monitoring bone mineral density by PCP. 3.  Multiple sclerosis: Chronic.  Continue evaluation and treatment per neurology.  4.  Esophageal discomfort/mild dysphagia:  Recent colonoscopy and EGD did not reveal any significant pathology.  I spent a total of 30 minutes reviewing chart data, face-to-face evaluation with the patient, counseling and coordination of care as detailed above.     Patient expressed understanding and was in agreement with this plan. She also understands that She can call clinic at any time with any questions,  concerns, or complaints.   Breast cancer bilateral, multifocal ER/PR pos, Her 2 neg,.   Staging form: Breast, AJCC 7th Edition     Clinical: Stage IIB (T2, N1, M0) - Signed by Forest Gleason, MD on 04/22/2015   Lloyd Huger, MD   01/08/2022 2:33 PM

## 2022-01-06 ENCOUNTER — Other Ambulatory Visit (HOSPITAL_COMMUNITY): Payer: Self-pay

## 2022-01-07 ENCOUNTER — Inpatient Hospital Stay: Payer: Medicare Other | Admitting: Pharmacist

## 2022-01-07 ENCOUNTER — Other Ambulatory Visit: Payer: Self-pay

## 2022-01-07 ENCOUNTER — Telehealth: Payer: Self-pay | Admitting: Pharmacy Technician

## 2022-01-07 ENCOUNTER — Inpatient Hospital Stay (HOSPITAL_BASED_OUTPATIENT_CLINIC_OR_DEPARTMENT_OTHER): Payer: Medicare Other | Admitting: Oncology

## 2022-01-07 ENCOUNTER — Inpatient Hospital Stay: Payer: Medicare Other

## 2022-01-07 ENCOUNTER — Encounter: Payer: Self-pay | Admitting: Oncology

## 2022-01-07 VITALS — BP 123/78 | HR 115 | Temp 97.9°F | Wt 199.7 lb

## 2022-01-07 DIAGNOSIS — C50919 Malignant neoplasm of unspecified site of unspecified female breast: Secondary | ICD-10-CM

## 2022-01-07 DIAGNOSIS — Z9013 Acquired absence of bilateral breasts and nipples: Secondary | ICD-10-CM | POA: Diagnosis not present

## 2022-01-07 DIAGNOSIS — Z171 Estrogen receptor negative status [ER-]: Secondary | ICD-10-CM | POA: Diagnosis not present

## 2022-01-07 DIAGNOSIS — F5101 Primary insomnia: Secondary | ICD-10-CM

## 2022-01-07 DIAGNOSIS — G35 Multiple sclerosis: Secondary | ICD-10-CM | POA: Diagnosis not present

## 2022-01-07 DIAGNOSIS — C773 Secondary and unspecified malignant neoplasm of axilla and upper limb lymph nodes: Secondary | ICD-10-CM | POA: Diagnosis not present

## 2022-01-07 DIAGNOSIS — Z853 Personal history of malignant neoplasm of breast: Secondary | ICD-10-CM | POA: Diagnosis not present

## 2022-01-07 DIAGNOSIS — Z9221 Personal history of antineoplastic chemotherapy: Secondary | ICD-10-CM | POA: Diagnosis not present

## 2022-01-07 LAB — CBC WITH DIFFERENTIAL/PLATELET
Abs Immature Granulocytes: 0.03 10*3/uL (ref 0.00–0.07)
Basophils Absolute: 0.1 10*3/uL (ref 0.0–0.1)
Basophils Relative: 1 %
Eosinophils Absolute: 0.2 10*3/uL (ref 0.0–0.5)
Eosinophils Relative: 2 %
HCT: 40.7 % (ref 36.0–46.0)
Hemoglobin: 13.1 g/dL (ref 12.0–15.0)
Immature Granulocytes: 0 %
Lymphocytes Relative: 22 %
Lymphs Abs: 2 10*3/uL (ref 0.7–4.0)
MCH: 31 pg (ref 26.0–34.0)
MCHC: 32.2 g/dL (ref 30.0–36.0)
MCV: 96.4 fL (ref 80.0–100.0)
Monocytes Absolute: 0.8 10*3/uL (ref 0.1–1.0)
Monocytes Relative: 8 %
Neutro Abs: 6.1 10*3/uL (ref 1.7–7.7)
Neutrophils Relative %: 67 %
Platelets: 288 10*3/uL (ref 150–400)
RBC: 4.22 MIL/uL (ref 3.87–5.11)
RDW: 13.3 % (ref 11.5–15.5)
WBC: 9.2 10*3/uL (ref 4.0–10.5)
nRBC: 0 % (ref 0.0–0.2)

## 2022-01-07 LAB — COMPREHENSIVE METABOLIC PANEL
ALT: 25 U/L (ref 0–44)
AST: 32 U/L (ref 15–41)
Albumin: 4.1 g/dL (ref 3.5–5.0)
Alkaline Phosphatase: 72 U/L (ref 38–126)
Anion gap: 9 (ref 5–15)
BUN: 17 mg/dL (ref 8–23)
CO2: 27 mmol/L (ref 22–32)
Calcium: 10.6 mg/dL — ABNORMAL HIGH (ref 8.9–10.3)
Chloride: 99 mmol/L (ref 98–111)
Creatinine, Ser: 0.87 mg/dL (ref 0.44–1.00)
GFR, Estimated: >60 mL/min (ref >60)
Glucose, Bld: 129 mg/dL — ABNORMAL HIGH (ref 70–99)
Potassium: 3.9 mmol/L (ref 3.5–5.1)
Sodium: 135 mmol/L (ref 135–145)
Total Bilirubin: 0.2 mg/dL — ABNORMAL LOW (ref 0.3–1.2)
Total Protein: 7.9 g/dL (ref 6.5–8.1)

## 2022-01-07 MED ORDER — FULVESTRANT 250 MG/5ML IM SOSY
500.0000 mg | PREFILLED_SYRINGE | Freq: Once | INTRAMUSCULAR | Status: AC
  Administered 2022-01-07: 500 mg via INTRAMUSCULAR
  Filled 2022-01-07: qty 10

## 2022-01-07 NOTE — Telephone Encounter (Signed)
Oral Oncology Patient Advocate Encounter  With the patients permission, I submitted an online application to Patient Assistance Now Oncology [facility]).  [employer] will complete a benefits investigation and review the application submitted to determine if the patient qualifies for [employer] Patient [place].  Medication: Kisqali Confirmation number: [id number]  I faxed the completed Health Care Provider form to [phone].  [employer] phone number is [phone].  [place] Patient [place] Phone [phone] Fax [phone] 01/07/2022 10:59 AM

## 2022-01-07 NOTE — Progress Notes (Signed)
Pt wanted to know if taken the medication and tx will cure the other spots of cancer in her body?  Dr. Marry Guan March 7th about Knee Sx will the chemo medication interfere with anything?

## 2022-01-07 NOTE — Progress Notes (Signed)
[place]  Telephone:([phone] [phone]  Patient Care Team: [name], DO as PCP - General (Family Medicine) Christene Lye, MD (General Surgery) Forest Gleason, MD (Oncology) Pieter Partridge, DO as Consulting Physician (Neurology)   Name of the patient: Cassidy Norris  [id number]  1951/06/02   Date of visit: 01/07/22  HPI: Patient is a 71 y.o. female with recurrent breast cancer, ER/PR positive/HER2 negative. Planned treatment with Kisqali (ribociclib) and fulvestrant. She will received her fulvestrant today 01/07/22 and start her ribociclib tomorrow 01/08/22.  Reason for Consult: Kisqali (ribociclib) oral chemotherapy education.   PAST MEDICAL HISTORY: Past Medical History:  Diagnosis Date   Anemia    Arthritis not really diagnosis but have pain   B12 deficiency 07/23/2016   Will check levels to determine if injections are needed again. Await results. Recent CBC at Onc [facility]   Blood transfusion without reported diagnosis 1983 had a miscarriage/dnc   Cancer ([place]) 12/30/2013   Multifocal disease: pT1c,m, N0(isolated tumor cells) Er/ PR positive, Her 2 negative.   Cancer ([place]) 01/09/2014   Invasive lobular carcinoma, 2.2 cm, T2,N1 ER/PR positive, HER-2/neu negative   Cataract 2014?   Centrilobular emphysema ([place]) 10/27/2021   Diabetes mellitus without complication ([place])    Diffuse cystic mastopathy    Erosive esophagitis    Essential hypertension, benign 09/24/2017   Family history of malignant neoplasm of gastrointestinal tract 2012   Fractured elbow 08/28/2014   Gastroesophageal reflux disease    Hx of metabolic acidosis with increased anion gap    Increased heart rate    Multiple sclerosis (HCC)    Neuromuscular disorder (HCC)    neuropathy in bil feet - left is worse.   Obesity, unspecified    Peripheral neuropathy    Personal history of tobacco use, presenting hazards to health    Restless  leg syndrome    Sleep apnea    uses CPAP   Special screening for malignant neoplasms, colon     HEMATOLOGY/ONCOLOGY HISTORY:  Oncology History Overview Note  1. Bilateral carcinoma of breast, February, 2015. Right breast status post radical mastectomy. T1 C. N0M0 (isolated tumor cells in one lymph node) multifocal invasive cancer. Stage IC, Left breast, Status post radical mastectomy, T2, N1, M0 tumor. Stage II, RIght breast. Both tumors are estrogen receptor positive.  Progesterone receptor positive.  HER-2 receptor negative by FISH. Negative for BRCA mutation.  2. Started on Adriamycin and Cytoxan on March 01, 2014. Last cycle of Cytoxan and Adriamycin on May 01, 2014. Patient has finished last chemotherapy on September 9. (12 th dose was omitted because of neuropathy) 3.  Taking tamoxifen.  Patient had a poor tolerance to letrozole.(October, 2015) 4.  Patient is taking letrozole since November of 2015 5.  May, 2016 Letrozole has been put on hold because of bony pains 6.  Started on Aromasin from July of 2016    History of bilateral breast cancer (Resolved)  11/29/2013 Initial Diagnosis   Breast cancer bilateral, multifocal ER/PR pos, Her 2 neg,.     ALLERGIES:  has No Known Allergies.  MEDICATIONS:  Current Outpatient Medications  Medication Sig Dispense Refill   acetaminophen (TYLENOL) 500 MG tablet Take 1,000 mg by mouth 2 (two) times daily as needed for moderate pain or headache.     baclofen (LIORESAL) 10 MG tablet Take 1 tablet (10 mg total) by mouth 3 (three) times daily as needed for muscle spasms. 1080 tablet 0   calcium  carbonate (OSCAL) 1500 (600 Ca) MG TABS tablet Take by mouth 2 (two) times daily with a meal.     diclofenac Sodium (VOLTAREN) 1 % GEL Apply 2 g topically 3 (three) times daily as needed. 100 g 2   fluticasone (FLONASE) 50 MCG/ACT nasal spray PLACE 2 SPRAYS INTO BOTH NOSTRILS AT BEDTIME. 48 g 1   Krill Oil 1000 MG CAPS Take by mouth.     lisinopril  (ZESTRIL) 5 MG tablet TAKE 1 TABLET BY MOUTH EVERY EVENING 90 tablet 3   Melatonin 10 MG CAPS Take by mouth.     metFORMIN (GLUCOPHAGE) 500 MG tablet Take 1 tablet (500 mg total) by mouth 2 (two) times daily with a meal. 180 tablet 3   omeprazole (PRILOSEC) 40 MG capsule Take 1 capsule (40 mg total) by mouth 2 (two) times daily before a meal. 60 capsule 3   polyethylene glycol powder (GLYCOLAX/MIRALAX) powder Take 17 g by mouth 2 (two) times daily as needed for moderate constipation. 3350 g 1   ribociclib succ (KISQALI, 600 MG DOSE,) 200 MG Therapy Pack Take 3 tablets (600 mg total) by mouth daily. Take for 21 days on, 7 days off, repeat every 28 days. 63 tablet 0   traZODone (DESYREL) 100 MG tablet Take 1 tablet (100 mg total) by mouth at bedtime. 90 tablet 3   venlafaxine XR (EFFEXOR-XR) 75 MG 24 hr capsule Take 1 capsule (75 mg total) by mouth daily with breakfast. 360 capsule 0   vitamin B-12 (CYANOCOBALAMIN) 1000 MCG tablet Take 2,500 mcg by mouth daily.     Vitamin D, Cholecalciferol, 50 MCG (2000 UT) CAPS Take 1 capsule by mouth every 3 (three) days.     No current facility-administered medications for this visit.    VITAL SIGNS: LMP 12/22/1999 (Approximate)  There were no vitals filed for this visit.  Estimated body mass index is 33.75 kg/m as calculated from the following:   Height as of 01/01/22: 5' 4.5" (1.638 m).   Weight as of an earlier encounter on 01/07/22: 90.6 kg (199 lb 11.2 oz).  LABS: CBC:    Component Value Date/Time   WBC 9.2 01/07/2022 0844   HGB 13.1 01/07/2022 0844   HGB 15.5 07/13/2017 0937   HCT 40.7 01/07/2022 0844   HCT 46.7 (H) 07/13/2017 0937   PLT 288 01/07/2022 0844   PLT 211 07/13/2017 0937   MCV 96.4 01/07/2022 0844   MCV 94 07/13/2017 0937   MCV 93 03/11/2015 1356   NEUTROABS 6.1 01/07/2022 0844   NEUTROABS 5.4 07/13/2017 0937   NEUTROABS 5.6 03/11/2015 1356   LYMPHSABS 2.0 01/07/2022 0844   LYMPHSABS 2.1 07/13/2017 0937   LYMPHSABS 2.3  03/11/2015 1356   MONOABS 0.8 01/07/2022 0844   MONOABS 1.0 (H) 03/11/2015 1356   EOSABS 0.2 01/07/2022 0844   EOSABS 0.2 07/13/2017 0937   EOSABS 0.3 03/11/2015 1356   BASOSABS 0.1 01/07/2022 0844   BASOSABS 0.0 07/13/2017 0937   BASOSABS 0.1 03/11/2015 1356   Comprehensive Metabolic Panel:    Component Value Date/Time   NA 135 01/07/2022 0844   NA 142 07/14/2019 1516   NA 139 03/11/2015 1356   K 3.9 01/07/2022 0844   K 3.4 (L) 03/11/2015 1356   CL 99 01/07/2022 0844   CL 103 03/11/2015 1356   CO2 27 01/07/2022 0844   CO2 28 03/11/2015 1356   BUN 17 01/07/2022 0844   BUN 12 07/14/2019 1516   BUN 13 03/11/2015 1356   CREATININE  0.87 01/07/2022 0844   CREATININE 0.85 10/20/2021 0838   GLUCOSE 129 (H) 01/07/2022 0844   GLUCOSE 118 (H) 03/11/2015 1356   CALCIUM 10.6 (H) 01/07/2022 0844   CALCIUM 9.2 03/11/2015 1356   AST 32 01/07/2022 0844   AST 28 03/11/2015 1356   ALT 25 01/07/2022 0844   ALT 32 03/11/2015 1356   ALKPHOS 72 01/07/2022 0844   ALKPHOS 68 03/11/2015 1356   BILITOT 0.2 (L) 01/07/2022 0844   BILITOT 0.4 07/14/2019 1516   BILITOT 0.7 03/11/2015 1356   PROT 7.9 01/07/2022 0844   PROT 6.9 07/14/2019 1516   PROT 6.9 03/11/2015 1356   ALBUMIN 4.1 01/07/2022 0844   ALBUMIN 4.5 07/14/2019 1516   ALBUMIN 4.2 03/11/2015 1356     Present during today's visit: Patient only  Start plan: 01/08/22   Patient Education I spoke with patient for overview of new oral chemotherapy medication: Kisqali (ribociclib)  Administration: Counseled patient on administration, dosing, side effects, monitoring, drug-food interactions, safe handling, storage, and disposal. Patient will take Kisqali 3 tablets (600 mg) by mouth daily for 21 days, followed by 7 days off (28-day cycle) in conjunction with fulvestrant, planned duration until disease progression or unacceptable drug toxicity.  Patient had her medication with her. We opened the medication and looked through the  packaging together while going over administration. She is aware to start tomorrow, 2/16, and take all 3 tablets at one time. A calendar was provided to patient.   Side Effects: Side effects include but not limited to: nausea, vomiting, fatigue, headache, diarrhea, hair thinning, cough, rash, and back pain.   Diarrhea: patient report having loperamide at home to use as needed Nausea: patient knows to get [place] a call if she needs an antiemetic  Drug-drug Interactions (DDI): Trazodone - spoke with patient regarding splitting trazodone tablet in half to take 50 mg total at bedtime due to interaction with Kisqali and concern for increased trazodone levels causing QT prolongation and sedation. Pt will start taking half a trazodone tonight (2/15) and will request a lower dose (70m) when it is time to refill prescription.   Adherence: After discussion with patient no patient barriers to medication adherence identified.  Reviewed with patient importance of keeping a medication schedule and plan for any missed doses.  Patient did ask about keeping the tablets in a pill box. It was recommended to keep Kisqali in the original packaging.   Ms. DHottlevoiced understanding and appreciation. All questions answered. Medication handout provided.  [facility]. Patient knows to call the office with questions or concerns. Oral Chemotherapy [facility] will continue to follow.  Patient expressed understanding and was in agreement with this plan. She also understands that She can call clinic at any time with any questions, concerns, or complaints.   Medication Access Issues: Patient started with one month free voucher. Patient to provide income documentation for manufacturer assistance application to remain on ribociclib.  Follow-up plan: Return to clinic in 2 weeks.   Thank you for allowing me to participate in the care of this patient.   Time  Total: 30 minutes  Visit consisted of counseling and education on dealing with issues of symptom management in the setting of serious and potentially life-threatening illness.Greater than 50%  of this time was spent counseling and coordinating care related to the above assessment and plan.  Visit completed by: MDeatra Robinson PharmD Oncology PGY2  Present during visit and signed by: AAlphonsa Overall  Hollice Espy, PharmD, Bebe Shaggy, CPP Hematology/Oncology Clinical Pharmacist Practitioner /DB/AP [facility] [phone]  01/07/2022 10:47 AM

## 2022-01-08 ENCOUNTER — Encounter: Payer: Self-pay | Admitting: Oncology

## 2022-01-10 DIAGNOSIS — Z20822 Contact with and (suspected) exposure to covid-19: Secondary | ICD-10-CM | POA: Diagnosis not present

## 2022-01-12 NOTE — Telephone Encounter (Signed)
Oral Oncology Patient Advocate Encounter  Received notification from [employer] that patient has been successfully enrolled into their program to receive Kisqali from the manufacturer at $0 out of pocket until 11/22/22.    I called and spoke with patient.  She knows we will have to re-apply.   Specialty Pharmacy that will dispense medication is [facility].  Patient knows to call the office with questions or concerns.   Oral Oncology Clinic will continue to follow.  [place] Patient [place] Phone [phone] Fax [phone] 01/12/2022 8:53 AM

## 2022-01-13 ENCOUNTER — Telehealth: Payer: Self-pay | Admitting: Pharmacist

## 2022-01-13 DIAGNOSIS — F5101 Primary insomnia: Secondary | ICD-10-CM

## 2022-01-13 NOTE — Telephone Encounter (Signed)
Oral Chemotherapy Pharmacist Encounter   Ms. Sulak called and left a VM stating that she was having trouble sleeping since decreasing her dose of the trazodone to 50mg . The recommendation to decrease her trazodone dose from 100mg  to 50mg  was based on a potential DDI with ribociclib which may increase the serum concentration of trazodone. The recommendation for this interaction was to consider the use of a lower trazodone dose and monitor for increased trazodone effects (eg, sedation, QTc prolongation). Because Ms. Cahn is not having an issue with over sedation, she is okay to increase back up to her normal trazodone 100mg . Her QTc will be monitored at her next 2 office visit per the normal ribociclib monitoring plan.    Darl Pikes, PharmD, BCPS, BCOP, CPP Hematology/Oncology Clinical Pharmacist [place]/DB/AP [facility] [phone]  01/13/2022 12:33 PM

## 2022-01-14 ENCOUNTER — Other Ambulatory Visit: Payer: Self-pay | Admitting: *Deleted

## 2022-01-14 DIAGNOSIS — C50919 Malignant neoplasm of unspecified site of unspecified female breast: Secondary | ICD-10-CM

## 2022-01-16 NOTE — Progress Notes (Signed)
[place]  [phone]) [phone]  Fax:([phone]) [place] DOB: 08/16/1951  MR#: [id number]  ZES#:[id number]  Patient Care Team: [name], DO as PCP - General (Family Medicine) Christene Lye, MD (General Surgery) Forest Gleason, MD (Oncology) Pieter Partridge, DO as Consulting Physician (Neurology)  CHIEF COMPLAINT: Recurrent, metastatic ER/PR, HER2 negative invasive carcinoma of the breast.    INTERVAL HISTORY: Patient returns to clinic today for routine 2-week evaluation and continuation of Kisqali and fulvestrant.  She is tolerating her treatments well without significant side effects.  She has no new neurologic complaints.  She denies any recent fevers or illnesses. She has a good appetite and denies weight loss.  She has no chest pain, shortness of breath, cough, or hemoptysis.  She denies any nausea, vomiting, constipation, or diarrhea. She has no urinary complaints.  Patient offers no further specific complaints today.  REVIEW OF SYSTEMS:   Review of Systems  Constitutional: Negative.  Negative for fever, malaise/fatigue and weight loss.  Respiratory: Negative.  Negative for cough, hemoptysis and shortness of breath.   Cardiovascular: Negative.  Negative for chest pain and leg swelling.  Gastrointestinal: Negative.  Negative for abdominal pain.  Genitourinary: Negative.  Negative for dysuria.  Musculoskeletal: Negative.  Negative for joint pain and myalgias.  Skin: Negative.  Negative for rash.  Neurological: Negative.  Negative for dizziness, sensory change, focal weakness, weakness and headaches.  Psychiatric/Behavioral: Negative.  The patient is not nervous/anxious.    As per HPI. Otherwise, a complete review of systems is negative.  ONCOLOGY HISTORY: Oncology History Overview Note  1. Bilateral carcinoma of breast, February, 2015. Right breast status post radical mastectomy. T1 C. N0M0 (isolated tumor cells in one  lymph node) multifocal invasive cancer. Stage IC, Left breast, Status post radical mastectomy, T2, N1, M0 tumor. Stage II, RIght breast. Both tumors are estrogen receptor positive.  Progesterone receptor positive.  HER-2 receptor negative by FISH. Negative for BRCA mutation.  2. Started on Adriamycin and Cytoxan on March 01, 2014. Last cycle of Cytoxan and Adriamycin on May 01, 2014. Patient has finished last chemotherapy on September 9. (12 th dose was omitted because of neuropathy) 3.  Taking tamoxifen.  Patient had a poor tolerance to letrozole.(October, 2015) 4.  Patient is taking letrozole since November of 2015 5.  May, 2016 Letrozole has been put on hold because of bony pains 6.  Started on Aromasin from July of 2016    History of bilateral breast cancer (Resolved)  11/29/2013 Initial Diagnosis   Breast cancer bilateral, multifocal ER/PR pos, Her 2 neg,.     PAST MEDICAL HISTORY: Past Medical History:  Diagnosis Date   Anemia    Arthritis not really diagnosis but have pain   B12 deficiency 07/23/2016   Will check levels to determine if injections are needed again. Await results. Recent CBC at [facility]c [facility]   Blood transfusion without reported diagnosis 1983 had a miscarriage/dnc   Cancer ([place]) 12/30/2013   Multifocal disease: pT1c,m, N0(isolated tumor cells) Er/ PR positive, Her 2 negative.   Cancer ([place]) 01/09/2014   Invasive lobular carcinoma, 2.2 cm, T2,N1 ER/PR positive, HER-2/neu negative   Cataract 2014?   Centrilobular emphysema ([place]) 10/27/2021   Diabetes mellitus without complication ([place])    Diffuse cystic mastopathy    Erosive esophagitis    Essential hypertension, benign 09/24/2017   Family history of malignant neoplasm of gastrointestinal tract 2012   Fractured elbow 08/28/2014   Gastroesophageal  reflux disease    Hx of metabolic acidosis with increased anion gap    Increased heart rate    Multiple sclerosis (HCC)    Neuromuscular disorder (HCC)    neuropathy  in bil feet - left is worse.   Obesity, unspecified    Peripheral neuropathy    Personal history of tobacco use, presenting hazards to health    Restless leg syndrome    Sleep apnea    uses CPAP   Special screening for malignant neoplasms, colon     PAST SURGICAL HISTORY: Past Surgical History:  Procedure Laterality Date   BREAST BIOPSY Right 1973,2001   BREAST BIOPSY Right 11-07-13   INVASIVE MAMMARY CARCINOMA , ER/PR positive Her2 negative   BREAST BIOPSY Left 11-27-13   invasive lobular and DCIS   BREAST SURGERY Bilateral 01/09/14   mastectomy   CHOLECYSTECTOMY     COLONOSCOPY  2010   Dr. Jamal Collin   COLONOSCOPY WITH PROPOFOL N/A 06/11/2015   Procedure: COLONOSCOPY WITH PROPOFOL;  Surgeon: Christene Lye, MD;  Location: [facility] ENDOSCOPY;  Service: Endoscopy;  Laterality: N/A;   COLONOSCOPY WITH PROPOFOL N/A 12/23/2021   Procedure: COLONOSCOPY WITH PROPOFOL;  Surgeon: Lin Landsman, MD;  Location: [facility] ENDOSCOPY;  Service: Gastroenterology;  Laterality: N/A;   DILATATION & CURETTAGE/HYSTEROSCOPY WITH MYOSURE N/A 07/12/2018   Procedure: DILATATION & CURETTAGE/HYSTEROSCOPY WITH MYOSURE WITH ENDOMETRIAL POLYPECTOMY;  Surgeon: Will Bonnet, MD;  Location: [facility] ORS;  Service: Gynecology;  Laterality: N/A;   DILATION AND CURETTAGE OF UTERUS  1983   ESOPHAGOGASTRODUODENOSCOPY N/A 12/23/2021   Procedure: ESOPHAGOGASTRODUODENOSCOPY (EGD);  Surgeon: Lin Landsman, MD;  Location: [facility] ENDOSCOPY;  Service: Gastroenterology;  Laterality: N/A;   JOINT REPLACEMENT  11/23/2015   POLYPECTOMY  1989   PORTA CATH REMOVAL     PORTACATH PLACEMENT  2015   SPINE SURGERY  09/14/14   Ruptured disk L4- L5   TOTAL KNEE ARTHROPLASTY Left 11/22/2015   Procedure: TOTAL KNEE ARTHROPLASTY;  Surgeon: Earlie Server, MD;  Location: [place];  Service: Orthopedics;  Laterality: Left;   TUBAL LIGATION     WISDOM TOOTH EXTRACTION  2006    FAMILY HISTORY Family History  Problem Relation Age of  Onset   Cancer Mother        lung   COPD Mother    Cancer Father        colon, stomach   Diabetes Maternal Grandmother    Hypertension Sister    Rectal cancer Paternal Uncle    Rectal cancer Paternal Uncle     GYNECOLOGIC HISTORY:  Patient's last menstrual period was 12/22/1999 (approximate).     ADVANCED DIRECTIVES:    HEALTH MAINTENANCE: Social History   Tobacco Use   Smoking status: Former    Packs/day: 1.00    Years: 30.00    Pack years: 30.00    Types: Cigarettes    Quit date: 12/11/2013    Years since quitting: 8.1   Smokeless tobacco: Former  Scientific laboratory technician Use: Never used  Substance Use Topics   Alcohol use: Not Currently   Drug use: No     Colonoscopy:  PAP:  Bone density:  Mammogram:  No Known Allergies  Current Outpatient Medications  Medication Sig Dispense Refill   acetaminophen (TYLENOL) 500 MG tablet Take 1,000 mg by mouth 2 (two) times daily as needed for moderate pain or headache.     baclofen (LIORESAL) 10 MG tablet Take 1 tablet (10 mg total) by mouth 3 (three) times  daily as needed for muscle spasms. 1080 tablet 0   calcium carbonate (OSCAL) 1500 (600 Ca) MG TABS tablet Take by mouth 2 (two) times daily with a meal.     diclofenac Sodium (VOLTAREN) 1 % GEL Apply 2 g topically 3 (three) times daily as needed. 100 g 2   fluticasone (FLONASE) 50 MCG/ACT nasal spray PLACE 2 SPRAYS INTO BOTH NOSTRILS AT BEDTIME. 48 g 1   Krill Oil 1000 MG CAPS Take by mouth.     lisinopril (ZESTRIL) 5 MG tablet TAKE 1 TABLET BY MOUTH EVERY EVENING 90 tablet 3   Melatonin 10 MG CAPS Take by mouth.     metFORMIN (GLUCOPHAGE) 500 MG tablet Take 1 tablet (500 mg total) by mouth 2 (two) times daily with a meal. 180 tablet 3   omeprazole (PRILOSEC) 40 MG capsule Take 1 capsule (40 mg total) by mouth 2 (two) times daily before a meal. 60 capsule 3   polyethylene glycol powder (GLYCOLAX/MIRALAX) powder Take 17 g by mouth 2 (two) times daily as needed for moderate  constipation. 3350 g 1   ribociclib succ (KISQALI, 600 MG DOSE,) 200 MG Therapy Pack Take 3 tablets (600 mg total) by mouth daily. Take for 21 days on, 7 days off, repeat every 28 days. 63 tablet 0   traZODone (DESYREL) 100 MG tablet Take 100 mg by mouth at bedtime.     venlafaxine XR (EFFEXOR-XR) 75 MG 24 hr capsule Take 1 capsule (75 mg total) by mouth daily with breakfast. 360 capsule 0   vitamin B-12 (CYANOCOBALAMIN) 1000 MCG tablet Take 2,500 mcg by mouth daily.     Vitamin D, Cholecalciferol, 50 MCG (2000 UT) CAPS Take 1 capsule by mouth every 3 (three) days.     No current facility-administered medications for this visit.   Facility-Administered Medications Ordered in Other Visits  Medication Dose Route Frequency Provider Last Rate Last Admin   fulvestrant (FASLODEX) injection 500 mg  500 mg Intramuscular Once Lloyd Huger, MD        OBJECTIVE: BP (!) 150/86    Pulse 97    Temp 97.6 F (36.4 C) (Tympanic)    Wt 201 lb 8 oz (91.4 kg)    LMP 12/22/1999 (Approximate)    BMI 34.05 kg/m    Body mass index is 34.05 kg/m.    ECOG FS:0 - Asymptomatic  General: Well-developed, well-nourished, no acute distress. Eyes: Pink conjunctiva, anicteric sclera. HEENT: Normocephalic, moist mucous membranes. Breast: Bilateral mastectomy. Lungs: No audible wheezing or coughing. Heart: Regular rate and rhythm. Abdomen: Soft, nontender, no obvious distention. Musculoskeletal: No edema, cyanosis, or clubbing. Neuro: Alert, answering all questions appropriately. Cranial nerves grossly intact. Skin: No rashes or petechiae noted. Psych: Normal affect.  LAB RESULTS:  Appointment on 01/22/2022  Component Date Value Ref Range Status   Sodium 01/22/2022 138  135 - 145 mmol/L Final   Potassium 01/22/2022 3.6  3.5 - 5.1 mmol/L Final   Chloride 01/22/2022 105  98 - 111 mmol/L Final   CO2 01/22/2022 25  22 - 32 mmol/L Final   Glucose, Bld 01/22/2022 118 (H)  70 - 99 mg/dL Final   Glucose  reference range applies only to samples taken after fasting for at least 8 hours.   BUN 01/22/2022 23  8 - 23 mg/dL Final   Creatinine, Ser 01/22/2022 1.66 (H)  0.44 - 1.00 mg/dL Final   Calcium 01/22/2022 8.9  8.9 - 10.3 mg/dL Final   Total Protein 01/22/2022 7.3  6.5 -  8.1 g/dL Final   Albumin 01/22/2022 4.0  3.5 - 5.0 g/dL Final   AST 01/22/2022 30  15 - 41 U/L Final   ALT 01/22/2022 22  0 - 44 U/L Final   Alkaline Phosphatase 01/22/2022 72  38 - 126 U/L Final   Total Bilirubin 01/22/2022 0.5  0.3 - 1.2 mg/dL Final   GFR, Estimated 01/22/2022 33 (L)  >60 mL/min Final   Comment: (NOTE) Calculated using the CKD-EPI Creatinine Equation (2021)    Anion gap 01/22/2022 8  5 - 15 Final   Performed at [facility], [address] [place], [place] [address]   WBC 01/22/2022 4.7  4.0 - 10.5 K/uL Final   RBC 01/22/2022 3.57 (L)  3.87 - 5.11 MIL/uL Final   Hemoglobin 01/22/2022 11.2 (L)  12.0 - 15.0 g/dL Final   HCT 01/22/2022 34.2 (L)  36.0 - 46.0 % Final   MCV 01/22/2022 95.8  80.0 - 100.0 fL Final   MCH 01/22/2022 31.4  26.0 - 34.0 pg Final   MCHC 01/22/2022 32.7  30.0 - 36.0 g/dL Final   RDW 01/22/2022 12.8  11.5 - 15.5 % Final   Platelets 01/22/2022 158  150 - 400 K/uL Final   nRBC 01/22/2022 0.0  0.0 - 0.2 % Final   Neutrophils Relative % 01/22/2022 60  % Final   Neutro Abs 01/22/2022 2.7  1.7 - 7.7 K/uL Final   Lymphocytes Relative 01/22/2022 31  % Final   Lymphs Abs 01/22/2022 1.5  0.7 - 4.0 K/uL Final   Monocytes Relative 01/22/2022 4  % Final   Monocytes Absolute 01/22/2022 0.2  0.1 - 1.0 K/uL Final   Eosinophils Relative 01/22/2022 4  % Final   Eosinophils Absolute 01/22/2022 0.2  0.0 - 0.5 K/uL Final   Basophils Relative 01/22/2022 1  % Final   Basophils Absolute 01/22/2022 0.1  0.0 - 0.1 K/uL Final   Immature Granulocytes 01/22/2022 0  % Final   Abs Immature Granulocytes 01/22/2022 0.02  0.00 - 0.07 K/uL Final   Performed at [facility], [address]  [place], [place] [address]    STUDIES: No results found.  ONCOLOGY HISTORY:  Bilateral adenocarcinoma of the breast. Patient is status post bilateral mastectomy in February 2015. She also received adjuvant chemotherapy with Adriamycin, Cytoxan, and Taxol completing treatment in approximately October 2015. She then initiated letrozole, but could not tolerate secondary to leg pain and was switched to Aromasin.  Patient then switched to tamoxifen in October 2019 secondary to cost.  Patient subsequently discontinued tamoxifen and did not complete the recommended 5 years of treatment.   ASSESSMENT: Recurrent, metastatic ER/PR, HER2 negative invasive carcinoma of the breast.   PLAN:    1. Recurrent, metastatic ER/PR, HER2 negative invasive carcinoma of the breast: See oncology history as above.  Biopsy confirmed recurrent disease.  PET scan results from December 26, 2021 reviewed independently with hypermetabolic left supraclavicular, subpectoral, and axillary lymphadenopathy consistent with nodal recurrence of patient's history of breast cancer.  No distant metastases were identified.  Her most recent CA 27-29 was reported 260.1.  Continue Kisqali 600 mg daily for 21 days with 7 days off.  She will initially receive fulvestrant every 2 weeks as a loading dose and then once per month thereafter.  Proceed with treatment today.  Return to clinic in 2 weeks for further evaluation and continuation of fulvestrant.  At that point, patient can be transitioned to monthly treatments and evaluation.  Appreciate clinical pharmacy  input. 2. Postmenopausal: Patient's last bone mineral density was on August 01, 2016 and revealed a T-score of -1.2. Continue monitoring bone mineral density by PCP. 3.  Multiple sclerosis: Chronic.  Continue evaluation and treatment per neurology.  4.  Esophageal discomfort/mild dysphagia: Recent colonoscopy and EGD did not reveal any significant pathology. 5.  Renal insufficiency: Patient  admits to not drinking many fluids. Thelma Comp will need to be dose reduced if her GFR falls persistently below 30.  I spent a total of 30 minutes reviewing chart data, face-to-face evaluation with the patient, counseling and coordination of care as detailed above.      Patient expressed understanding and was in agreement with this plan. She also understands that She can call clinic at any time with any questions, concerns, or complaints.   Breast cancer bilateral, multifocal ER/PR pos, Her 2 neg,.   Staging form: Breast, AJCC 7th Edition     Clinical: Stage IIB (T2, N1, M0) - Signed by Forest Gleason, MD on 04/22/2015   Lloyd Huger, MD   01/22/2022 10:42 AM

## 2022-01-22 ENCOUNTER — Inpatient Hospital Stay: Payer: Medicare Other

## 2022-01-22 ENCOUNTER — Inpatient Hospital Stay: Payer: Medicare Other | Attending: Oncology

## 2022-01-22 ENCOUNTER — Inpatient Hospital Stay (HOSPITAL_BASED_OUTPATIENT_CLINIC_OR_DEPARTMENT_OTHER): Payer: Medicare Other | Admitting: Oncology

## 2022-01-22 ENCOUNTER — Inpatient Hospital Stay: Payer: Medicare Other | Admitting: Pharmacist

## 2022-01-22 ENCOUNTER — Other Ambulatory Visit: Payer: Self-pay

## 2022-01-22 ENCOUNTER — Encounter: Payer: Self-pay | Admitting: Oncology

## 2022-01-22 VITALS — BP 150/86 | HR 97 | Temp 97.6°F | Wt 201.5 lb

## 2022-01-22 DIAGNOSIS — C773 Secondary and unspecified malignant neoplasm of axilla and upper limb lymph nodes: Secondary | ICD-10-CM | POA: Insufficient documentation

## 2022-01-22 DIAGNOSIS — C50919 Malignant neoplasm of unspecified site of unspecified female breast: Secondary | ICD-10-CM

## 2022-01-22 DIAGNOSIS — G35 Multiple sclerosis: Secondary | ICD-10-CM | POA: Insufficient documentation

## 2022-01-22 DIAGNOSIS — Z79899 Other long term (current) drug therapy: Secondary | ICD-10-CM | POA: Diagnosis not present

## 2022-01-22 DIAGNOSIS — Z9013 Acquired absence of bilateral breasts and nipples: Secondary | ICD-10-CM | POA: Insufficient documentation

## 2022-01-22 DIAGNOSIS — Z9221 Personal history of antineoplastic chemotherapy: Secondary | ICD-10-CM | POA: Diagnosis not present

## 2022-01-22 DIAGNOSIS — Z853 Personal history of malignant neoplasm of breast: Secondary | ICD-10-CM | POA: Insufficient documentation

## 2022-01-22 DIAGNOSIS — Z171 Estrogen receptor negative status [ER-]: Secondary | ICD-10-CM | POA: Insufficient documentation

## 2022-01-22 LAB — CBC WITH DIFFERENTIAL/PLATELET
Abs Immature Granulocytes: 0.02 10*3/uL (ref 0.00–0.07)
Basophils Absolute: 0.1 10*3/uL (ref 0.0–0.1)
Basophils Relative: 1 %
Eosinophils Absolute: 0.2 10*3/uL (ref 0.0–0.5)
Eosinophils Relative: 4 %
HCT: 34.2 % — ABNORMAL LOW (ref 36.0–46.0)
Hemoglobin: 11.2 g/dL — ABNORMAL LOW (ref 12.0–15.0)
Immature Granulocytes: 0 %
Lymphocytes Relative: 31 %
Lymphs Abs: 1.5 10*3/uL (ref 0.7–4.0)
MCH: 31.4 pg (ref 26.0–34.0)
MCHC: 32.7 g/dL (ref 30.0–36.0)
MCV: 95.8 fL (ref 80.0–100.0)
Monocytes Absolute: 0.2 10*3/uL (ref 0.1–1.0)
Monocytes Relative: 4 %
Neutro Abs: 2.7 10*3/uL (ref 1.7–7.7)
Neutrophils Relative %: 60 %
Platelets: 158 10*3/uL (ref 150–400)
RBC: 3.57 MIL/uL — ABNORMAL LOW (ref 3.87–5.11)
RDW: 12.8 % (ref 11.5–15.5)
WBC: 4.7 10*3/uL (ref 4.0–10.5)
nRBC: 0 % (ref 0.0–0.2)

## 2022-01-22 LAB — COMPREHENSIVE METABOLIC PANEL
ALT: 22 U/L (ref 0–44)
AST: 30 U/L (ref 15–41)
Albumin: 4 g/dL (ref 3.5–5.0)
Alkaline Phosphatase: 72 U/L (ref 38–126)
Anion gap: 8 (ref 5–15)
BUN: 23 mg/dL (ref 8–23)
CO2: 25 mmol/L (ref 22–32)
Calcium: 8.9 mg/dL (ref 8.9–10.3)
Chloride: 105 mmol/L (ref 98–111)
Creatinine, Ser: 1.66 mg/dL — ABNORMAL HIGH (ref 0.44–1.00)
GFR, Estimated: 33 mL/min — ABNORMAL LOW (ref >60)
Glucose, Bld: 118 mg/dL — ABNORMAL HIGH (ref 70–99)
Potassium: 3.6 mmol/L (ref 3.5–5.1)
Sodium: 138 mmol/L (ref 135–145)
Total Bilirubin: 0.5 mg/dL (ref 0.3–1.2)
Total Protein: 7.3 g/dL (ref 6.5–8.1)

## 2022-01-22 MED ORDER — FULVESTRANT 250 MG/5ML IM SOSY
500.0000 mg | PREFILLED_SYRINGE | Freq: Once | INTRAMUSCULAR | Status: AC
  Administered 2022-01-22: 500 mg via INTRAMUSCULAR
  Filled 2022-01-22: qty 10

## 2022-01-22 NOTE — Progress Notes (Signed)
Pt noticed 2 days ago that she was having some heart flutters. ? ?

## 2022-01-22 NOTE — Progress Notes (Signed)
[facility]  Telephone:([phone] Fax:(336) [phone]    I connected with Ms. Fiddler on 01/22/22 at  3:00 PM EST by telephone and verified that I am speaking with the correct person using two identifiers.  Name of the patient: Cassidy Norris  [id number]  03/26/1951   Location: Patient: home Provider: in office   I discussed the limitations, risks, security and privacy concerns of performing an evaluation and management service by telephone and the availability of in person appointments. I also discussed with the patient that there may be a patient responsible charge related to this service. The patient expressed understanding and agreed to proceed.  HPI: Patient is a 71 y.o. female with recurrent breast cancer, ER/PR positive/HER2 negative. Planned treatment with Kisqali (ribociclib) and fulvestrant. She received her fulvestrant on 01/07/22 and started her ribociclib on 01/08/22.  Reason for Consult: Oral chemotherapy follow-up for ribociclib therapy.   PAST MEDICAL HISTORY: Past Medical History:  Diagnosis Date   Anemia    Arthritis not really diagnosis but have pain   B12 deficiency 07/23/2016   Will check levels to determine if injections are needed again. Await results. Recent CBC at Onc WNL   Blood transfusion without reported diagnosis 1983 had a miscarriage/dnc   Cancer ([place]) 12/30/2013   Multifocal disease: pT1c,m, N0(isolated tumor cells) Er/ PR positive, Her 2 negative.   Cancer ([place]) 01/09/2014   Invasive lobular carcinoma, 2.2 cm, T2,N1 ER/PR positive, HER-2/neu negative   Cataract 2014?   Centrilobular emphysema ([place]) 10/27/2021   Diabetes mellitus without complication ([place])    Diffuse cystic mastopathy    Erosive esophagitis    Essential hypertension, benign 09/24/2017   Family history of malignant neoplasm of gastrointestinal tract 2012   Fractured elbow 08/28/2014   Gastroesophageal reflux disease     Hx of metabolic acidosis with increased anion gap    Increased heart rate    Multiple sclerosis (HCC)    Neuromuscular disorder (HCC)    neuropathy in bil feet - left is worse.   Obesity, unspecified    Peripheral neuropathy    Personal history of tobacco use, presenting hazards to health    Restless leg syndrome    Sleep apnea    uses CPAP   Special screening for malignant neoplasms, colon     HEMATOLOGY/ONCOLOGY HISTORY:  Oncology History Overview Note  1. Bilateral carcinoma of breast, February, 2015. Right breast status post radical mastectomy. T1 C. N0M0 (isolated tumor cells in one lymph node) multifocal invasive cancer. Stage IC, Left breast, Status post radical mastectomy, T2, N1, M0 tumor. Stage II, RIght breast. Both tumors are estrogen receptor positive.  Progesterone receptor positive.  HER-2 receptor negative by FISH. Negative for BRCA mutation.  2. Started on Adriamycin and Cytoxan on March 01, 2014. Last cycle of Cytoxan and Adriamycin on May 01, 2014. Patient has finished last chemotherapy on September 9. (12 th dose was omitted because of neuropathy) 3.  Taking tamoxifen.  Patient had a poor tolerance to letrozole.(October, 2015) 4.  Patient is taking letrozole since November of 2015 5.  May, 2016 Letrozole has been put on hold because of bony pains 6.  Started on Aromasin from July of 2016    History of bilateral breast cancer (Resolved)  11/29/2013 Initial Diagnosis   Breast cancer bilateral, multifocal ER/PR pos, Her 2 neg,.     ALLERGIES:  has No Known Allergies.  MEDICATIONS:  Current Outpatient Medications  Medication Sig Dispense Refill  acetaminophen (TYLENOL) 500 MG tablet Take 1,000 mg by mouth 2 (two) times daily as needed for moderate pain or headache.     baclofen (LIORESAL) 10 MG tablet Take 1 tablet (10 mg total) by mouth 3 (three) times daily as needed for muscle spasms. 1080 tablet 0   calcium carbonate (OSCAL) 1500 (600 Ca) MG TABS tablet  Take by mouth 2 (two) times daily with a meal.     diclofenac Sodium (VOLTAREN) 1 % GEL Apply 2 g topically 3 (three) times daily as needed. 100 g 2   fluticasone (FLONASE) 50 MCG/ACT nasal spray PLACE 2 SPRAYS INTO BOTH NOSTRILS AT BEDTIME. 48 g 1   Krill Oil 1000 MG CAPS Take by mouth.     lisinopril (ZESTRIL) 5 MG tablet TAKE 1 TABLET BY MOUTH EVERY EVENING 90 tablet 3   Melatonin 10 MG CAPS Take by mouth.     metFORMIN (GLUCOPHAGE) 500 MG tablet Take 1 tablet (500 mg total) by mouth 2 (two) times daily with a meal. 180 tablet 3   omeprazole (PRILOSEC) 40 MG capsule Take 1 capsule (40 mg total) by mouth 2 (two) times daily before a meal. 60 capsule 3   polyethylene glycol powder (GLYCOLAX/MIRALAX) powder Take 17 g by mouth 2 (two) times daily as needed for moderate constipation. 3350 g 1   ribociclib succ (KISQALI, 600 MG DOSE,) 200 MG Therapy Pack Take 3 tablets (600 mg total) by mouth daily. Take for 21 days on, 7 days off, repeat every 28 days. 63 tablet 0   traZODone (DESYREL) 100 MG tablet Take 100 mg by mouth at bedtime.     venlafaxine XR (EFFEXOR-XR) 75 MG 24 hr capsule Take 1 capsule (75 mg total) by mouth daily with breakfast. 360 capsule 0   vitamin B-12 (CYANOCOBALAMIN) 1000 MCG tablet Take 2,500 mcg by mouth daily.     Vitamin D, Cholecalciferol, 50 MCG (2000 UT) CAPS Take 1 capsule by mouth every 3 (three) days.     No current facility-administered medications for this visit.    VITAL SIGNS: LMP 12/22/1999 (Approximate)  There were no vitals filed for this visit.  Estimated body mass index is 34.05 kg/m as calculated from the following:   Height as of 01/01/22: 5' 4.5" (1.638 m).   Weight as of an earlier encounter on 01/22/22: 91.4 kg (201 lb 8 oz).  LABS: CBC:    Component Value Date/Time   WBC 4.7 01/22/2022 0902   HGB 11.2 (L) 01/22/2022 0902   HGB 15.5 07/13/2017 0937   HCT 34.2 (L) 01/22/2022 0902   HCT 46.7 (H) 07/13/2017 0937   PLT 158 01/22/2022 0902   PLT  211 07/13/2017 0937   MCV 95.8 01/22/2022 0902   MCV 94 07/13/2017 0937   MCV 93 03/11/2015 1356   NEUTROABS 2.7 01/22/2022 0902   NEUTROABS 5.4 07/13/2017 0937   NEUTROABS 5.6 03/11/2015 1356   LYMPHSABS 1.5 01/22/2022 0902   LYMPHSABS 2.1 07/13/2017 0937   LYMPHSABS 2.3 03/11/2015 1356   MONOABS 0.2 01/22/2022 0902   MONOABS 1.0 (H) 03/11/2015 1356   EOSABS 0.2 01/22/2022 0902   EOSABS 0.2 07/13/2017 0937   EOSABS 0.3 03/11/2015 1356   BASOSABS 0.1 01/22/2022 0902   BASOSABS 0.0 07/13/2017 0937   BASOSABS 0.1 03/11/2015 1356   Comprehensive Metabolic Panel:    Component Value Date/Time   NA 138 01/22/2022 0902   NA 142 07/14/2019 1516   NA 139 03/11/2015 1356   K 3.6 01/22/2022 0902  K 3.4 (L) 03/11/2015 1356   CL 105 01/22/2022 0902   CL 103 03/11/2015 1356   CO2 25 01/22/2022 0902   CO2 28 03/11/2015 1356   BUN 23 01/22/2022 0902   BUN 12 07/14/2019 1516   BUN 13 03/11/2015 1356   CREATININE 1.66 (H) 01/22/2022 0902   CREATININE 0.85 10/20/2021 0838   GLUCOSE 118 (H) 01/22/2022 0902   GLUCOSE 118 (H) 03/11/2015 1356   CALCIUM 8.9 01/22/2022 0902   CALCIUM 9.2 03/11/2015 1356   AST 30 01/22/2022 0902   AST 28 03/11/2015 1356   ALT 22 01/22/2022 0902   ALT 32 03/11/2015 1356   ALKPHOS 72 01/22/2022 0902   ALKPHOS 68 03/11/2015 1356   BILITOT 0.5 01/22/2022 0902   BILITOT 0.4 07/14/2019 1516   BILITOT 0.7 03/11/2015 1356   PROT 7.3 01/22/2022 0902   PROT 6.9 07/14/2019 1516   PROT 6.9 03/11/2015 1356   ALBUMIN 4.0 01/22/2022 0902   ALBUMIN 4.5 07/14/2019 1516   ALBUMIN 4.2 03/11/2015 1356    On phone during today's visit: patient only  Assessment and Plan-  Continue ribociclib 623m 21 on/7off SCr increased since last visit, patient endorsed poor oral hydration and diarrhea/loose stools.  Recommended increased hydration and loperamide  ECG reviewed, QTc <500 msec, patient will have repeat ECG at next appt. This will be her final initiation ECG. Further  ECG will only be checked unless clinical indicated.   Oral Chemotherapy Side Effect/Intolerance:  Diarrhea: Patient reported using loperamide once in the last 2 weeks on the day she had 4 loose bowel movements. Currently she is having about 3 loose stools per day, I recommended that she resume using loperamide today, with the goal of getting her back to her normal one bowel movement per day. No reported nausea, fatigue, or headache  Other: Ms. DThierreports not having increased her tradozone, because the reduced dose (563m began working for her sleep. She plans on continue on the 5018mose for now.  Oral Chemotherapy Adherence: No missed doses reported No patient barriers to medication adherence identified.   New medications: None reported  Medication Access Issues: She has schedule to have her next cycle   Patient expressed understanding and was in agreement with this plan. She also understands that She can call clinic at any time with any questions, concerns, or complaints.   Follow-up plan: RTC in 2 weeks  Thank you for allowing me to participate in the care of this very pleasant patient.   Time Total: 15 mins of non face-to-face telephone visit time during this encounter  Visit consisted of counseling and education on dealing with issues of symptom management in the setting of serious and potentially life-threatening illness.Greater than 50%  of this time was spent counseling and coordinating care related to the above assessment and plan.   AlyDarl PikesharmD, BCPS, BCOP, CPP Hematology/Oncology Clinical Pharmacist Practitioner [place]/DB/AP [phone]/12/2021 3:09 PM

## 2022-01-23 ENCOUNTER — Other Ambulatory Visit: Payer: Self-pay | Admitting: Family Medicine

## 2022-01-23 DIAGNOSIS — K219 Gastro-esophageal reflux disease without esophagitis: Secondary | ICD-10-CM

## 2022-01-23 DIAGNOSIS — E1169 Type 2 diabetes mellitus with other specified complication: Secondary | ICD-10-CM

## 2022-01-23 NOTE — Telephone Encounter (Signed)
D/C 12/23/21. ?Requested Prescriptions  ?Signed Prescriptions Disp Refills  ? metFORMIN (GLUCOPHAGE) 500 MG tablet 180 tablet 2  ?  Sig: TAKE 1 TABLET BY MOUTH 2 TIMES DAILY WITH A MEAL.  ?  ? Endocrinology:  Diabetes - Biguanides Failed - 01/23/2022  5:28 AM  ?  ?  Failed - Cr in normal range and within 360 days  ?  Creat  ?Date Value Ref Range Status  ?10/20/2021 0.85 0.50 - 1.05 mg/dL Final  ? ?Creatinine, Ser  ?Date Value Ref Range Status  ?01/22/2022 1.66 (H) 0.44 - 1.00 mg/dL Final  ?   ?  ?  Failed - eGFR in normal range and within 360 days  ?  GFR, Est African American  ?Date Value Ref Range Status  ?10/15/2020 72 > OR = 60 mL/min/1.64m Final  ? ?GFR, Est Non African American  ?Date Value Ref Range Status  ?10/15/2020 62 > OR = 60 mL/min/1.729mFinal  ? ?GFR, Estimated  ?Date Value Ref Range Status  ?01/22/2022 33 (L) >60 mL/min Final  ?  Comment:  ?  (NOTE) ?Calculated using the CKD-EPI Creatinine Equation (2021) ?  ? ?GFR  ?Date Value Ref Range Status  ?12/28/2019 62.40 >60.00 mL/min Final  ? ?eGFR  ?Date Value Ref Range Status  ?10/20/2021 74 > OR = 60 mL/min/1.7329minal  ?  Comment:  ?  The eGFR is based on the CKD-EPI 2021 equation. To calculate  ?the new eGFR from a previous Creatinine or Cystatin C ?result, go to https://www.kidney.org/professionals/ ?kdoqi/gfr%5Fcalculator ?  ?   ?  ?  Failed - B12 Level in normal range and within 720 days  ?  Vitamin B-12  ?Date Value Ref Range Status  ?10/20/2021 >2,000 (H) 200 - 1,100 pg/mL Final  ?   ?  ?  Failed - CBC within normal limits and completed in the last 12 months  ?  WBC  ?Date Value Ref Range Status  ?01/22/2022 4.7 4.0 - 10.5 K/uL Final  ? ?RBC  ?Date Value Ref Range Status  ?01/22/2022 3.57 (L) 3.87 - 5.11 MIL/uL Final  ? ?Hemoglobin  ?Date Value Ref Range Status  ?01/22/2022 11.2 (L) 12.0 - 15.0 g/dL Final  ?07/13/2017 15.5 11.1 - 15.9 g/dL Final  ? ?HCT  ?Date Value Ref Range Status  ?01/22/2022 34.2 (L) 36.0 - 46.0 % Final  ? ?Hematocrit  ?Date  Value Ref Range Status  ?07/13/2017 46.7 (H) 34.0 - 46.6 % Final  ? ?MCHC  ?Date Value Ref Range Status  ?01/22/2022 32.7 30.0 - 36.0 g/dL Final  ? ?MCH  ?Date Value Ref Range Status  ?01/22/2022 31.4 26.0 - 34.0 pg Final  ? ?MCV  ?Date Value Ref Range Status  ?01/22/2022 95.8 80.0 - 100.0 fL Final  ?07/13/2017 94 79 - 97 fL Final  ?03/11/2015 93 80 - 100 fL Final  ? ?No results found for: PLTCOUNTKUC, LABPLAT, POCWaKeeneyDW  ?Date Value Ref Range Status  ?01/22/2022 12.8 11.5 - 15.5 % Final  ?07/13/2017 13.1 12.3 - 15.4 % Final  ?03/11/2015 13.8 11.5 - 14.5 % Final  ? ?  ?  ?  Passed - HBA1C is between 0 and 7.9 and within 180 days  ?  HB A1C (BAYER DCA - WAIVED)  ?Date Value Ref Range Status  ?07/13/2017 6.5 <7.0 % Final  ?  Comment:  ?  Diabetic Adult            <7.0 ?                                      Healthy Adult        4.3 - 5.7 ?                                                          (DCCT/NGSP) ?American Diabetes Association's Summary of Glycemic Recommendations ?for Adults with Diabetes: Hemoglobin A1c <7.0%. More stringent ?glycemic goals (A1c <6.0%) may further reduce complications at the ?cost of increased risk of hypoglycemia. ?  ? ?Hgb A1c MFr Bld  ?Date Value Ref Range Status  ?10/20/2021 6.6 (H) <5.7 % of total Hgb Final  ?  Comment:  ?  For someone without known diabetes, a hemoglobin A1c ?value of 6.5% or greater indicates that they may have  ?diabetes and this should be confirmed with a follow-up  ?test. ?. ?For someone with known diabetes, a value <7% indicates  ?that their diabetes is well controlled and a value  ?greater than or equal to 7% indicates suboptimal  ?control. A1c targets should be individualized based on  ?duration of diabetes, age, comorbid conditions, and  ?other considerations. ?. ?Currently, no consensus exists regarding use of ?hemoglobin A1c for diagnosis of diabetes for children. ?. ?  ?   ?  ?  Passed - Valid encounter within last 6  months  ?  Recent Outpatient Visits   ?      ? 2 months ago Annual physical exam  ? [place], DO  ? 9 months ago Chest wall pain  ? [place], DO  ? 1 year ago Type 2 diabetes mellitus with other specified complication, without long-term current use of insulin ([place])  ? [place], DO  ? 1 year ago Type 2 diabetes mellitus with other specified complication, without long-term current use of insulin ([place])  ? [place], DO  ? 2 years ago Primary insomnia  ? [place], DO  ?  ?  ?Future Appointments   ?        ? In 3 months Karamalegos, [facility], [place]  ? In 7 months [name], MD [place]  ?  ? ?  ?  ?  ?Refused Prescriptions Disp Refills  ?? famotidine (PEPCID) 20 MG tablet [Pharmacy Med Name: FAMOTIDINE 20 MG TABLET] 180 tablet 0  ?  Sig: TAKE 1 TABLET BY MOUTH TWICE A DAY BEFORE MEALS  ?  ? Gastroenterology:  H2 Antagonists Passed - 01/23/2022  5:28 AM  ?  ?  Passed - Valid encounter within last 12 months  ?  Recent Outpatient Visits   ?      ? 2 months ago Annual physical exam  ? [place], DO  ? 9 months ago Chest wall pain  ? [place], DO  ? 1 year ago Type 2 diabetes mellitus with other specified  complication, without long-term current use of insulin ([place])  ? [place], DO  ? 1 year ago Type 2 diabetes mellitus with other specified complication, without long-term current use of insulin ([place])  ? [place], DO  ? 2 years ago Primary insomnia  ? [place], DO  ?  ?  ?Future Appointments   ?        ? In 3 months Karamalegos, [facility], [place]  ? In 7 months [name], MD [place]  ?  ? ?  ?  ?  ? ?

## 2022-01-26 ENCOUNTER — Encounter: Payer: Self-pay | Admitting: Family Medicine

## 2022-01-27 DIAGNOSIS — E785 Hyperlipidemia, unspecified: Secondary | ICD-10-CM | POA: Diagnosis not present

## 2022-01-27 DIAGNOSIS — G620 Drug-induced polyneuropathy: Secondary | ICD-10-CM | POA: Diagnosis not present

## 2022-01-27 DIAGNOSIS — E1169 Type 2 diabetes mellitus with other specified complication: Secondary | ICD-10-CM | POA: Diagnosis not present

## 2022-01-27 DIAGNOSIS — M1711 Unilateral primary osteoarthritis, right knee: Secondary | ICD-10-CM | POA: Diagnosis not present

## 2022-01-27 DIAGNOSIS — I700 Atherosclerosis of aorta: Secondary | ICD-10-CM | POA: Diagnosis not present

## 2022-01-27 DIAGNOSIS — T451X5A Adverse effect of antineoplastic and immunosuppressive drugs, initial encounter: Secondary | ICD-10-CM | POA: Diagnosis not present

## 2022-01-28 ENCOUNTER — Other Ambulatory Visit: Payer: Self-pay

## 2022-01-28 ENCOUNTER — Ambulatory Visit: Payer: Self-pay

## 2022-01-28 ENCOUNTER — Telehealth: Payer: Self-pay | Admitting: Pharmacist

## 2022-01-28 ENCOUNTER — Emergency Department
Admission: EM | Admit: 2022-01-28 | Discharge: 2022-01-28 | Disposition: A | Discharge status: Home / Self Care | Payer: Medicare Other | Attending: Emergency Medicine | Admitting: Emergency Medicine

## 2022-01-28 ENCOUNTER — Emergency Department: Payer: Medicare Other

## 2022-01-28 DIAGNOSIS — I10 Essential (primary) hypertension: Secondary | ICD-10-CM | POA: Diagnosis not present

## 2022-01-28 DIAGNOSIS — K7689 Other specified diseases of liver: Secondary | ICD-10-CM | POA: Diagnosis not present

## 2022-01-28 DIAGNOSIS — Z20822 Contact with and (suspected) exposure to covid-19: Secondary | ICD-10-CM | POA: Diagnosis not present

## 2022-01-28 DIAGNOSIS — R110 Nausea: Secondary | ICD-10-CM | POA: Diagnosis not present

## 2022-01-28 DIAGNOSIS — R6883 Chills (without fever): Secondary | ICD-10-CM | POA: Diagnosis not present

## 2022-01-28 DIAGNOSIS — E119 Type 2 diabetes mellitus without complications: Secondary | ICD-10-CM | POA: Insufficient documentation

## 2022-01-28 DIAGNOSIS — Z9013 Acquired absence of bilateral breasts and nipples: Secondary | ICD-10-CM | POA: Diagnosis not present

## 2022-01-28 DIAGNOSIS — R630 Anorexia: Secondary | ICD-10-CM | POA: Diagnosis not present

## 2022-01-28 DIAGNOSIS — C50919 Malignant neoplasm of unspecified site of unspecified female breast: Secondary | ICD-10-CM

## 2022-01-28 DIAGNOSIS — D84821 Immunodeficiency due to drugs: Secondary | ICD-10-CM | POA: Insufficient documentation

## 2022-01-28 DIAGNOSIS — R197 Diarrhea, unspecified: Secondary | ICD-10-CM

## 2022-01-28 DIAGNOSIS — C50911 Malignant neoplasm of unspecified site of right female breast: Secondary | ICD-10-CM | POA: Insufficient documentation

## 2022-01-28 DIAGNOSIS — R188 Other ascites: Secondary | ICD-10-CM | POA: Diagnosis not present

## 2022-01-28 DIAGNOSIS — C50912 Malignant neoplasm of unspecified site of left female breast: Secondary | ICD-10-CM | POA: Diagnosis not present

## 2022-01-28 DIAGNOSIS — R000 Tachycardia, unspecified: Secondary | ICD-10-CM | POA: Diagnosis not present

## 2022-01-28 DIAGNOSIS — Z853 Personal history of malignant neoplasm of breast: Secondary | ICD-10-CM | POA: Insufficient documentation

## 2022-01-28 DIAGNOSIS — R112 Nausea with vomiting, unspecified: Secondary | ICD-10-CM | POA: Diagnosis not present

## 2022-01-28 DIAGNOSIS — N2889 Other specified disorders of kidney and ureter: Secondary | ICD-10-CM | POA: Diagnosis not present

## 2022-01-28 LAB — GASTROINTESTINAL PANEL BY PCR, STOOL (REPLACES STOOL CULTURE)

## 2022-01-28 LAB — LIPASE, BLOOD: Lipase: 50 U/L (ref 11–51)

## 2022-01-28 LAB — URINALYSIS, ROUTINE W REFLEX MICROSCOPIC
Bilirubin Urine: NEGATIVE
Glucose, UA: NEGATIVE mg/dL
Hgb urine dipstick: NEGATIVE
Ketones, ur: NEGATIVE mg/dL
Nitrite: NEGATIVE
Protein, ur: NEGATIVE mg/dL
Specific Gravity, Urine: 1.004 — ABNORMAL LOW (ref 1.005–1.030)
pH: 5 (ref 5.0–8.0)

## 2022-01-28 LAB — C DIFFICILE QUICK SCREEN W PCR REFLEX
C Diff antigen: NEGATIVE
C Diff interpretation: NOT DETECTED
C Diff toxin: NEGATIVE

## 2022-01-28 LAB — CBC
HCT: 31.8 % — ABNORMAL LOW (ref 36.0–46.0)
Hemoglobin: 10.6 g/dL — ABNORMAL LOW (ref 12.0–15.0)
MCH: 31.5 pg (ref 26.0–34.0)
MCHC: 33.3 g/dL (ref 30.0–36.0)
MCV: 94.4 fL (ref 80.0–100.0)
Platelets: 130 10*3/uL — ABNORMAL LOW (ref 150–400)
RBC: 3.37 MIL/uL — ABNORMAL LOW (ref 3.87–5.11)
RDW: 12.5 % (ref 11.5–15.5)
WBC: 3.8 10*3/uL — ABNORMAL LOW (ref 4.0–10.5)
nRBC: 0 % (ref 0.0–0.2)

## 2022-01-28 LAB — COMPREHENSIVE METABOLIC PANEL
ALT: 26 U/L (ref 0–44)
AST: 36 U/L (ref 15–41)
Albumin: 3.9 g/dL (ref 3.5–5.0)
Alkaline Phosphatase: 71 U/L (ref 38–126)
Anion gap: 11 (ref 5–15)
BUN: 19 mg/dL (ref 8–23)
CO2: 24 mmol/L (ref 22–32)
Calcium: 8.9 mg/dL (ref 8.9–10.3)
Chloride: 105 mmol/L (ref 98–111)
Creatinine, Ser: 1.5 mg/dL — ABNORMAL HIGH (ref 0.44–1.00)
GFR, Estimated: 37 mL/min — ABNORMAL LOW (ref >60)
Glucose, Bld: 128 mg/dL — ABNORMAL HIGH (ref 70–99)
Potassium: 3.3 mmol/L — ABNORMAL LOW (ref 3.5–5.1)
Sodium: 140 mmol/L (ref 135–145)
Total Bilirubin: 1 mg/dL (ref 0.3–1.2)
Total Protein: 7.4 g/dL (ref 6.5–8.1)

## 2022-01-28 LAB — RESP PANEL BY RT-PCR (FLU A&B, COVID) ARPGX2
Influenza A by PCR: NEGATIVE
Influenza B by PCR: NEGATIVE
SARS Coronavirus 2 by RT PCR: NEGATIVE

## 2022-01-28 LAB — MAGNESIUM: Magnesium: 1.3 mg/dL — ABNORMAL LOW (ref 1.7–2.4)

## 2022-01-28 MED ORDER — IOHEXOL 300 MG/ML  SOLN
75.0000 mL | Freq: Once | INTRAMUSCULAR | Status: AC | PRN
  Administered 2022-01-28: 75 mL via INTRAVENOUS

## 2022-01-28 MED ORDER — LACTATED RINGERS IV BOLUS
1000.0000 mL | Freq: Once | INTRAVENOUS | Status: AC
  Administered 2022-01-28: 1000 mL via INTRAVENOUS

## 2022-01-28 MED ORDER — ONDANSETRON HCL 4 MG/2ML IJ SOLN
4.0000 mg | Freq: Once | INTRAMUSCULAR | Status: AC
  Administered 2022-01-28: 4 mg via INTRAVENOUS
  Filled 2022-01-28: qty 2

## 2022-01-28 MED ORDER — LOPERAMIDE HCL 2 MG PO TABS: 4.0000 mg | ORAL_TABLET | Freq: Four times a day (QID) | ORAL | 0 refills | Status: DC | PRN

## 2022-01-28 MED ORDER — PANTOPRAZOLE SODIUM 40 MG PO TBEC: 40.0000 mg | DELAYED_RELEASE_TABLET | Freq: Every day | ORAL | 0 refills | Status: DC

## 2022-01-28 MED ORDER — MAGNESIUM SULFATE 2 GM/50ML IV SOLN
2.0000 g | Freq: Once | INTRAVENOUS | Status: AC
Start: 2022-01-28 — End: 2022-01-28
  Administered 2022-01-28: 2 g via INTRAVENOUS
  Filled 2022-01-28: qty 50

## 2022-01-28 MED ORDER — ONDANSETRON 4 MG PO TBDP: 4.0000 mg | ORAL_TABLET | Freq: Three times a day (TID) | ORAL | 0 refills | Status: DC | PRN

## 2022-01-28 MED ORDER — PROCHLORPERAZINE MALEATE 10 MG PO TABS: 10.0000 mg | ORAL_TABLET | Freq: Four times a day (QID) | ORAL | 0 refills | Status: DC | PRN

## 2022-01-28 MED ORDER — LOPERAMIDE HCL 2 MG PO CAPS
4.0000 mg | ORAL_CAPSULE | Freq: Once | ORAL | Status: AC
  Administered 2022-01-28: 4 mg via ORAL
  Filled 2022-01-28: qty 2

## 2022-01-28 NOTE — Telephone Encounter (Signed)
Summary: Request clarification if she need to see Dr Raliegh Ip for ER FU in 3 days  ? Patient called in to inform Dr Raliegh Ip that she was seen at ER on 01/28/22 due to dehydration from cancer medication she is taking which caused her to have diarrhea and she saw that noted on the paper work it say see PCP in 3 days. Patient is scheduled next Thursday to see  Dr Massie Maroon whom she called to see if she needed to go in earlier since he is her cancer Dr but was told no. She just need clarification on if she need to schedule with Dr Raliegh Ip or not. Please call with an answer to Ph# [phone]   ?  ?         ?          Pt. States the diarrhea "was caused by my cancer medication and I'm following up with him. Does Dr. [name] want to see me too?" Please advise pt.  ?

## 2022-01-28 NOTE — ED Triage Notes (Signed)
Pt to ED via [facility] from home. Pt with hx breast cancer and double mastectomy. Pt states breast cancer is back. Pt was placed on a chemo pill 21 days ago and states since she has been having diarrhea. Pt reports yesterday she started having chills and felt nauseas. Pt denies any blood in stool. Pt denies CP.  ?

## 2022-01-28 NOTE — ED Notes (Signed)
Stool sample sent to lab

## 2022-01-28 NOTE — ED Notes (Signed)
Pt transported to xray 

## 2022-01-28 NOTE — ED Provider Notes (Signed)
? ?[facility] ?Provider Note ? ? ? Event Date/Time  ? First MD Initiated Contact with Patient 01/28/22 [phone]   ?  (approximate) ? ? ?History  ? ?Diarrhea and Nausea ? ? ?HPI ? ?Cassidy Norris is a 71 y.o. female with a history of diabetes, hypertension, breast cancer status post bilateral mastectomy, now with recurrence and on oral chemotherapy for the last 3 weeks under the care of Dr. Grayland Ormond who comes ED complaining of nausea, decreased appetite, and diarrhea with chills that started yesterday.  No black or bloody stool.  No frank vomiting.  She reports that she eats but not as much as she should, and does not drink much fluids.  No dizziness chest pain or shortness of breath.  No other acute symptoms. ?  ? ? ?Physical Exam  ? ?Triage Vital Signs: ?ED Triage Vitals  ?Enc Vitals Group  ?   BP 01/28/22 0900 (!) 150/118  ?   Pulse Rate 01/28/22 0900 (!) 103  ?   Resp 01/28/22 0900 20  ?   Temp 01/28/22 0904 98.1 ?F (36.7 ?C)  ?   Temp Source 01/28/22 0904 Oral  ?   SpO2 01/28/22 0900 100 %  ?   Weight 01/28/22 0859 201 lb 8 oz (91.4 kg)  ?   Height 01/28/22 0859 '5\' 5"'$  (1.651 m)  ?   Head Circumference --   ?   Peak Flow --   ?   Pain Score 01/28/22 0859 0  ?   Pain Loc --   ?   Pain Edu? --   ?   Excl. in [place]? --   ? ? ?Most recent vital signs: ?Vitals:  ? 01/28/22 1035 01/28/22 1100  ?BP: (!) 144/67 130/69  ?Pulse: 84 91  ?Resp:  16  ?Temp:    ?SpO2: 98% 99%  ? ? ? ?General: Awake, no distress.  ?CV:  Good peripheral perfusion.  Tachycardia heart rate 100 ?Resp:  Normal effort.  Clear to auscultation bilaterally ?Abd:   Soft, mildly distended, diffuse upper abdominal tenderness ?Other:  No lower extremity edema, calf swelling, rash.  Dry mucous membranes. ? ? ?ED Results / Procedures / Treatments  ? ?Labs ?(all labs ordered are listed, but only abnormal results are displayed) ?Labs Reviewed  ?COMPREHENSIVE METABOLIC PANEL - Abnormal; Notable for the following components:  ?    Result Value   ? Potassium 3.3 (*)   ? Glucose, Bld 128 (*)   ? Creatinine, Ser 1.50 (*)   ? GFR, Estimated 37 (*)   ? All other components within normal limits  ?CBC - Abnormal; Notable for the following components:  ? WBC 3.8 (*)   ? RBC 3.37 (*)   ? Hemoglobin 10.6 (*)   ? HCT 31.8 (*)   ? Platelets 130 (*)   ? All other components within normal limits  ?URINALYSIS, ROUTINE W REFLEX MICROSCOPIC - Abnormal; Notable for the following components:  ? Color, Urine STRAW (*)   ? APPearance CLEAR (*)   ? Specific Gravity, Urine 1.004 (*)   ? Leukocytes,Ua TRACE (*)   ? Bacteria, UA MANY (*)   ? All other components within normal limits  ?MAGNESIUM - Abnormal; Notable for the following components:  ? Magnesium 1.3 (*)   ? All other components within normal limits  ?GASTROINTESTINAL PANEL BY PCR, STOOL (REPLACES STOOL CULTURE)  ?C DIFFICILE QUICK SCREEN W PCR REFLEX    ?RESP PANEL BY RT-PCR (FLU A&B, COVID) [id number]  ?  LIPASE, BLOOD  ? ? ? ?EKG ? ?Inter by me ?Sinus tachycardia rate 102.  Normal axis and intervals.  Normal QRS ST segments and T waves. ? ? ?RADIOLOGY ?CT abdomen pelvis viewed and interpreted by me, no evidence of free air or obstruction.  Radiology report reviewed, no acute findings ? ? ? ?PROCEDURES: ? ?Critical Care performed: No ? ?Procedures ? ? ?MEDICATIONS ORDERED IN ED: ?Medications  ?loperamide (IMODIUM) capsule 4 mg (has no administration in time range)  ?lactated ringers bolus 1,000 mL (0 mLs Intravenous Stopped 01/28/22 1201)  ?ondansetron (ZOFRAN) injection 4 mg (4 mg Intravenous Given 01/28/22 1030)  ?iohexol (OMNIPAQUE) 300 MG/ML solution 75 mL (75 mLs Intravenous Contrast Given 01/28/22 0953)  ?magnesium sulfate IVPB 2 g 50 mL (0 g Intravenous Stopped 01/28/22 1200)  ? ? ? ?IMPRESSION / MDM / ASSESSMENT AND PLAN / ED COURSE  ?I reviewed the triage vital signs and the nursing notes. ?             ?               ? ?Differential diagnosis includes, but is not limited to, bowel obstruction, bowel perforation,  intra-abdominal abscess, diverticulitis, dehydration, electrolyte abnormality, metastatic disease, C. difficile colitis, pathogenic enteritis ? ?**The patient is on the cardiac monitor to evaluate for evidence of arrhythmia and/or significant heart rate changes.**} ? ?Patient presents with nausea, decreased oral intake, diarrhea in the setting of history of breast cancer, recurrent, on chemotherapy.  She has mild tachycardia which I think is due to dehydration.  She is not septic.  No fever.  Exam shows some mild abdominal tenderness, but no peritonitis. ? ?We will obtain labs, CT abdomen pelvis.  Will give IV fluids for hydration and IV Zofran. ? ?Clinical Course as of 01/28/22 1227  ?Wed Jan 28, 2022  ?1033 CT shows no acute findings.  Serum labs unremarkable except for hypomagnesemia due to diarrhea and GI losses.  Will await stool studies. [PS]  ?  ?Clinical Course User Index ?[PS] Carrie Mew, MD  ? ? ?----------------------------------------- ?12:27 PM on 01/28/2022 ?----------------------------------------- ?Stool studies negative, no signs of any pathogenic infection.  Not requiring admission due to negative CT and reassuring labs.  Low magnesium level was repleted in the ED.  Will start on Imodium as well as Zofran.  Start PPI.  Suspect this is medication induced from her chemotherapeutic.  Follow-up with PCP/oncology. ? ? ?FINAL CLINICAL IMPRESSION(S) / ED DIAGNOSES  ? ?Final diagnoses:  ?Diarrhea, unspecified type  ? ? ? ?Rx / DC Orders  ? ?ED Discharge Orders   ? ?      Ordered  ?  loperamide (IMODIUM A-D) 2 MG tablet  4 times daily PRN       ? 01/28/22 1225  ?  ondansetron (ZOFRAN-ODT) 4 MG disintegrating tablet  Every 8 hours PRN       ? 01/28/22 1225  ?  pantoprazole (PROTONIX) 40 MG tablet  Daily       ? 01/28/22 1225  ? ?  ?  ? ?  ? ? ? ?Note:  This document was prepared using Dragon voice recognition software and may include unintentional dictation errors. ?  ?Carrie Mew, MD ?01/28/22  1228 ? ?

## 2022-01-28 NOTE — Telephone Encounter (Signed)
Oral Chemotherapy Pharmacist Encounter  ? ?Patient called on 01/28/22 to report she was home from the ED and she went because she was weak. I reviewed the ED visit and it looks like they gave her IV fluids, mag, ondansetron, and loperamide. She was ultimately discharged from the ED. She reports she was feeling better and planned to increase her home hydration. Of not today 01/28/22 was her last dose of ribociclib for this cycle. ? ?She was calling me to see if the prescriptions they sent her home with from the ED were okay to take if needed. They discharged her with pantoprazole, loperamide, and ondansetron. At home she has loperamide, which she takes as needed and is already taking omeprazole twice daily. Because she has these two medications at home, I instructed her to not pick up the pantoprazole or loperamide. In the ED they checked an ECG which showed a QTc of 493 msec, this is higher than the QTc on 01/22/22. We know her ribociclib has a risk of QTc prolongation and so does ondansetron. Because of this I told her not to take the ondansetron at this time and sent in a prescription for prochlorperazine which has a lower risk of QTc prolongation. She will have a repeat ECG checked at her appt on 02/05/22. ? ?I let Dr.Finnegan know about her recent ED visit and that today was her last dose of ribociclib for this cycle. Because she is feeling better, he is okay with seeing her next scheduled appt on 02/05/22. She knows to call the clinic to be seen sooner in symptom management clinic if her "weakness" returns. And it is during normal business hours. Provided her with the general phone number to the cancer center. ? ?Ms. Moulton stated her understanding of the plan.  ? ? ?Darl Pikes, PharmD, BCPS, BCOP, CPP ?Hematology/Oncology Clinical Pharmacist ?[place]/[facility] ?[phone] ? ?01/28/2022 4:33 PM ? ?

## 2022-01-29 NOTE — Telephone Encounter (Signed)
Not required for patient to come in earlier to see me. ? ?This particular medical problem is cancer medication related and would ideally be seen by their Symptom Management / acute clinic. ? ?It sounds like Dr Grayland Ormond is aware, I can route to him once more for review to make sure they are okay with 3/16 and not a sooner visit with Symptom Management ? ?Nobie Putnam, DO ?[facility] ?[place] [facility] ?01/29/2022, 5:04 PM ? ?

## 2022-01-30 NOTE — Progress Notes (Unsigned)
[place]  [phone]  Fax:(336) [place] DOB: 05-Jun-1951  MR#: [id number]  RAQ#:[id number]  Patient Care Team: [name], DO as PCP - General (Family Medicine) Christene Lye, MD (General Surgery) Forest Gleason, MD (Oncology) Pieter Partridge, DO as Consulting Physician (Neurology)  CHIEF COMPLAINT: Recurrent, metastatic ER/PR, HER2 negative invasive carcinoma of the breast.    INTERVAL HISTORY: Patient returns to clinic today for routine 2-week evaluation and continuation of Kisqali and fulvestrant.  She is tolerating her treatments well without significant side effects.  She has no new neurologic complaints.  She denies any recent fevers or illnesses. She has a good appetite and denies weight loss.  She has no chest pain, shortness of breath, cough, or hemoptysis.  She denies any nausea, vomiting, constipation, or diarrhea. She has no urinary complaints.  Patient offers no further specific complaints today.  REVIEW OF SYSTEMS:   Review of Systems  Constitutional: Negative.  Negative for fever, malaise/fatigue and weight loss.  Respiratory: Negative.  Negative for cough, hemoptysis and shortness of breath.   Cardiovascular: Negative.  Negative for chest pain and leg swelling.  Gastrointestinal: Negative.  Negative for abdominal pain.  Genitourinary: Negative.  Negative for dysuria.  Musculoskeletal: Negative.  Negative for joint pain and myalgias.  Skin: Negative.  Negative for rash.  Neurological: Negative.  Negative for dizziness, sensory change, focal weakness, weakness and headaches.  Psychiatric/Behavioral: Negative.  The patient is not nervous/anxious.    As per HPI. Otherwise, a complete review of systems is negative.  ONCOLOGY HISTORY: Oncology History Overview Note  1. Bilateral carcinoma of breast, February, 2015. Right breast status post radical mastectomy. T1 C. N0M0 (isolated tumor cells in one  lymph node) multifocal invasive cancer. Stage IC, Left breast, Status post radical mastectomy, T2, N1, M0 tumor. Stage II, RIght breast. Both tumors are estrogen receptor positive.  Progesterone receptor positive.  HER-2 receptor negative by FISH. Negative for BRCA mutation.  2. Started on Adriamycin and Cytoxan on March 01, 2014. Last cycle of Cytoxan and Adriamycin on May 01, 2014. Patient has finished last chemotherapy on September 9. (12 th dose was omitted because of neuropathy) 3.  Taking tamoxifen.  Patient had a poor tolerance to letrozole.(October, 2015) 4.  Patient is taking letrozole since November of 2015 5.  May, 2016 Letrozole has been put on hold because of bony pains 6.  Started on Aromasin from July of 2016    History of bilateral breast cancer (Resolved)  11/29/2013 Initial Diagnosis   Breast cancer bilateral, multifocal ER/PR pos, Her 2 neg,.     PAST MEDICAL HISTORY: Past Medical History:  Diagnosis Date   Anemia    Arthritis not really diagnosis but have pain   B12 deficiency 07/23/2016   Will check levels to determine if injections are needed again. Await results. Recent CBC at Onc [facility]   Blood transfusion without reported diagnosis 1983 had a miscarriage/dnc   Cancer ([place]) 12/30/2013   Multifocal disease: pT1c,m, N0(isolated tumor cells) Er/ PR positive, Her 2 negative.   Cancer ([place]) 01/09/2014   Invasive lobular carcinoma, 2.2 cm, T2,N1 ER/PR positive, HER-2/neu negative   Cataract 2014?   Centrilobular emphysema ([place]) 10/27/2021   Diabetes mellitus without complication ([place])    Diffuse cystic mastopathy    Erosive esophagitis    Essential hypertension, benign 09/24/2017   Family history of malignant neoplasm of gastrointestinal tract 2012   Fractured elbow 08/28/2014   Gastroesophageal  reflux disease    Hx of metabolic acidosis with increased anion gap    Increased heart rate    Multiple sclerosis (HCC)    Neuromuscular disorder (HCC)    neuropathy  in bil feet - left is worse.   Obesity, unspecified    Peripheral neuropathy    Personal history of tobacco use, presenting hazards to health    Restless leg syndrome    Sleep apnea    uses CPAP   Special screening for malignant neoplasms, colon     PAST SURGICAL HISTORY: Past Surgical History:  Procedure Laterality Date   BREAST BIOPSY Right 1973,2001   BREAST BIOPSY Right 11-07-13   INVASIVE MAMMARY CARCINOMA , ER/PR positive Her2 negative   BREAST BIOPSY Left 11-27-13   invasive lobular and DCIS   BREAST SURGERY Bilateral 01/09/14   mastectomy   CHOLECYSTECTOMY     COLONOSCOPY  2010   Dr. Jamal Collin   COLONOSCOPY WITH PROPOFOL N/A 06/11/2015   Procedure: COLONOSCOPY WITH PROPOFOL;  Surgeon: Christene Lye, MD;  Location: [facility] ENDOSCOPY;  Service: Endoscopy;  Laterality: N/A;   COLONOSCOPY WITH PROPOFOL N/A 12/23/2021   Procedure: COLONOSCOPY WITH PROPOFOL;  Surgeon: Lin Landsman, MD;  Location: [facility] ENDOSCOPY;  Service: Gastroenterology;  Laterality: N/A;   DILATATION & CURETTAGE/HYSTEROSCOPY WITH MYOSURE N/A 07/12/2018   Procedure: DILATATION & CURETTAGE/HYSTEROSCOPY WITH MYOSURE WITH ENDOMETRIAL POLYPECTOMY;  Surgeon: Will Bonnet, MD;  Location: [facility] ORS;  Service: Gynecology;  Laterality: N/A;   DILATION AND CURETTAGE OF UTERUS  1983   ESOPHAGOGASTRODUODENOSCOPY N/A 12/23/2021   Procedure: ESOPHAGOGASTRODUODENOSCOPY (EGD);  Surgeon: Lin Landsman, MD;  Location: [facility] ENDOSCOPY;  Service: Gastroenterology;  Laterality: N/A;   JOINT REPLACEMENT  11/23/2015   POLYPECTOMY  1989   PORTA CATH REMOVAL     PORTACATH PLACEMENT  2015   SPINE SURGERY  09/14/14   Ruptured disk L4- L5   TOTAL KNEE ARTHROPLASTY Left 11/22/2015   Procedure: TOTAL KNEE ARTHROPLASTY;  Surgeon: Earlie Server, MD;  Location: [facility];  Service: Orthopedics;  Laterality: Left;   TUBAL LIGATION     WISDOM TOOTH EXTRACTION  2006    FAMILY HISTORY Family History  Problem Relation Age of  Onset   Cancer Mother        lung   COPD Mother    Cancer Father        colon, stomach   Diabetes Maternal Grandmother    Hypertension Sister    Rectal cancer Paternal Uncle    Rectal cancer Paternal Uncle     GYNECOLOGIC HISTORY:  Patient's last menstrual period was 12/22/1999 (approximate).     ADVANCED DIRECTIVES:    HEALTH MAINTENANCE: Social History   Tobacco Use   Smoking status: Former    Packs/day: 1.00    Years: 30.00    Pack years: 30.00    Types: Cigarettes    Quit date: 12/11/2013    Years since quitting: 8.1   Smokeless tobacco: Former  Scientific laboratory technician Use: Never used  Substance Use Topics   Alcohol use: Not Currently   Drug use: No     Colonoscopy:  PAP:  Bone density:  Mammogram:  No Known Allergies  Current Outpatient Medications  Medication Sig Dispense Refill   acetaminophen (TYLENOL) 500 MG tablet Take 1,000 mg by mouth 2 (two) times daily as needed for moderate pain or headache.     baclofen (LIORESAL) 10 MG tablet Take 1 tablet (10 mg total) by mouth 3 (three) times  daily as needed for muscle spasms. 1080 tablet 0   calcium carbonate (OSCAL) 1500 (600 Ca) MG TABS tablet Take by mouth 2 (two) times daily with a meal.     diclofenac Sodium (VOLTAREN) 1 % GEL Apply 2 g topically 3 (three) times daily as needed. 100 g 2   fluticasone (FLONASE) 50 MCG/ACT nasal spray PLACE 2 SPRAYS INTO BOTH NOSTRILS AT BEDTIME. 48 g 1   Krill Oil 1000 MG CAPS Take by mouth.     lisinopril (ZESTRIL) 5 MG tablet TAKE 1 TABLET BY MOUTH EVERY EVENING 90 tablet 3   loperamide (IMODIUM A-D) 2 MG tablet Take 2 tablets (4 mg total) by mouth 4 (four) times daily as needed for diarrhea or loose stools. 30 tablet 0   loperamide (IMODIUM) 2 MG capsule Take by mouth as needed.     Melatonin 10 MG CAPS Take by mouth.     metFORMIN (GLUCOPHAGE) 500 MG tablet TAKE 1 TABLET BY MOUTH 2 TIMES DAILY WITH A MEAL. 180 tablet 2   omeprazole (PRILOSEC) 40 MG capsule Take 1 capsule  (40 mg total) by mouth 2 (two) times daily before a meal. 60 capsule 3   ondansetron (ZOFRAN-ODT) 4 MG disintegrating tablet Take 1 tablet (4 mg total) by mouth every 8 (eight) hours as needed for nausea or vomiting. 20 tablet 0   pantoprazole (PROTONIX) 40 MG tablet Take 1 tablet (40 mg total) by mouth daily. 30 tablet 0   polyethylene glycol powder (GLYCOLAX/MIRALAX) powder Take 17 g by mouth 2 (two) times daily as needed for moderate constipation. 3350 g 1   prochlorperazine (COMPAZINE) 10 MG tablet Take 1 tablet (10 mg total) by mouth every 6 (six) hours as needed for nausea or vomiting. 30 tablet 0   ribociclib succ (KISQALI, 600 MG DOSE,) 200 MG Therapy Pack Take 3 tablets (600 mg total) by mouth daily. Take for 21 days on, 7 days off, repeat every 28 days. 63 tablet 0   traZODone (DESYREL) 50 MG tablet Take 50 mg by mouth at bedtime.     venlafaxine XR (EFFEXOR-XR) 75 MG 24 hr capsule Take 1 capsule (75 mg total) by mouth daily with breakfast. 360 capsule 0   vitamin B-12 (CYANOCOBALAMIN) 1000 MCG tablet Take 2,500 mcg by mouth daily.     Vitamin D, Cholecalciferol, 50 MCG (2000 UT) CAPS Take 1 capsule by mouth every 3 (three) days.     No current facility-administered medications for this visit.    OBJECTIVE: LMP 12/22/1999 (Approximate)    There is no height or weight on file to calculate BMI.    ECOG FS:0 - Asymptomatic  General: Well-developed, well-nourished, no acute distress. Eyes: Pink conjunctiva, anicteric sclera. HEENT: Normocephalic, moist mucous membranes. Breast: Bilateral mastectomy. Lungs: No audible wheezing or coughing. Heart: Regular rate and rhythm. Abdomen: Soft, nontender, no obvious distention. Musculoskeletal: No edema, cyanosis, or clubbing. Neuro: Alert, answering all questions appropriately. Cranial nerves grossly intact. Skin: No rashes or petechiae noted. Psych: Normal affect.  LAB RESULTS:  No visits with results within 3 Day(s) from this visit.   Latest known visit with results is:  Admission on 01/28/2022, Discharged on 01/28/2022  Component Date Value Ref Range Status   Lipase 01/28/2022 50  11 - 51 U/L Final   Performed at [facility], [place], [place] [address]   Sodium 01/28/2022 140  135 - 145 mmol/L Final   Potassium 01/28/2022 3.3 (L)  3.5 - 5.1 mmol/L Final  Chloride 01/28/2022 105  98 - 111 mmol/L Final   CO2 01/28/2022 24  22 - 32 mmol/L Final   Glucose, Bld 01/28/2022 128 (H)  70 - 99 mg/dL Final   Glucose reference range applies only to samples taken after fasting for at least 8 hours.   BUN 01/28/2022 19  8 - 23 mg/dL Final   Creatinine, Ser 01/28/2022 1.50 (H)  0.44 - 1.00 mg/dL Final   Calcium 01/28/2022 8.9  8.9 - 10.3 mg/dL Final   Total Protein 01/28/2022 7.4  6.5 - 8.1 g/dL Final   Albumin 01/28/2022 3.9  3.5 - 5.0 g/dL Final   AST 01/28/2022 36  15 - 41 U/L Final   ALT 01/28/2022 26  0 - 44 U/L Final   Alkaline Phosphatase 01/28/2022 71  38 - 126 U/L Final   Total Bilirubin 01/28/2022 1.0  0.3 - 1.2 mg/dL Final   GFR, Estimated 01/28/2022 37 (L)  >60 mL/min Final   Comment: (NOTE) Calculated using the CKD-EPI Creatinine Equation (2021)    Anion gap 01/28/2022 11  5 - 15 Final   Performed at [facility], [address] [place], [place] [address]   WBC 01/28/2022 3.8 (L)  4.0 - 10.5 K/uL Final   RBC 01/28/2022 3.37 (L)  3.87 - 5.11 MIL/uL Final   Hemoglobin 01/28/2022 10.6 (L)  12.0 - 15.0 g/dL Final   HCT 01/28/2022 31.8 (L)  36.0 - 46.0 % Final   MCV 01/28/2022 94.4  80.0 - 100.0 fL Final   MCH 01/28/2022 31.5  26.0 - 34.0 pg Final   MCHC 01/28/2022 33.3  30.0 - 36.0 g/dL Final   RDW 01/28/2022 12.5  11.5 - 15.5 % Final   Platelets 01/28/2022 130 (L)  150 - 400 K/uL Final   Comment: Immature Platelet Fraction may be clinically indicated, consider ordering this additional test [id number]    nRBC 01/28/2022 0.0  0.0 - 0.2 % Final   Performed at [facility], [address] [place], [place] [address]   Color, Urine 01/28/2022 STRAW (A)  YELLOW Final   APPearance 01/28/2022 CLEAR (A)  CLEAR Final   Specific Gravity, Urine 01/28/2022 1.004 (L)  1.005 - 1.030 Final   pH 01/28/2022 5.0  5.0 - 8.0 Final   Glucose, UA 01/28/2022 NEGATIVE  NEGATIVE mg/dL Final   Hgb urine dipstick 01/28/2022 NEGATIVE  NEGATIVE Final   Bilirubin Urine 01/28/2022 NEGATIVE  NEGATIVE Final   Ketones, ur 01/28/2022 NEGATIVE  NEGATIVE mg/dL Final   Protein, ur 01/28/2022 NEGATIVE  NEGATIVE mg/dL Final   Nitrite 01/28/2022 NEGATIVE  NEGATIVE Final   Leukocytes,Ua 01/28/2022 TRACE (A)  NEGATIVE Final   WBC, UA 01/28/2022 0-5  0 - 5 WBC/hpf Final   Bacteria, UA 01/28/2022 MANY (A)  NONE SEEN Final   Squamous Epithelial / LPF 01/28/2022 0-5  0 - 5 Final   Mucus 01/28/2022 PRESENT   Final   Performed at [facility], [address] [place], [place] [address]   Magnesium 01/28/2022 1.3 (L)  1.7 - 2.4 mg/dL Final   Performed at [facility], [address] [place], [place] [address]   Campylobacter species 01/28/2022 NOT DETECTED  NOT DETECTED Final   Plesimonas shigelloides 01/28/2022 NOT DETECTED  NOT DETECTED Final   Salmonella species 01/28/2022 NOT DETECTED  NOT DETECTED Final   Yersinia enterocolitica 01/28/2022 NOT DETECTED  NOT DETECTED Final   Vibrio species 01/28/2022 NOT DETECTED  NOT DETECTED Final   Vibrio cholerae  01/28/2022 NOT DETECTED  NOT DETECTED Final   Enteroaggregative E coli (EAEC) 01/28/2022 NOT DETECTED  NOT DETECTED Final   Enteropathogenic E coli (EPEC) 01/28/2022 NOT DETECTED  NOT DETECTED Final   Enterotoxigenic E coli (ETEC) 01/28/2022 NOT DETECTED  NOT DETECTED Final   Shiga like toxin producing E coli * 01/28/2022 NOT DETECTED  NOT DETECTED Final   Shigella/Enteroinvasive E coli (EI* 01/28/2022 NOT DETECTED  NOT DETECTED Final   Cryptosporidium 01/28/2022 NOT DETECTED  NOT DETECTED Final   Cyclospora  cayetanensis 01/28/2022 NOT DETECTED  NOT DETECTED Final   Entamoeba histolytica 01/28/2022 NOT DETECTED  NOT DETECTED Final   Giardia lamblia 01/28/2022 NOT DETECTED  NOT DETECTED Final   Adenovirus F40/41 01/28/2022 NOT DETECTED  NOT DETECTED Final   Astrovirus 01/28/2022 NOT DETECTED  NOT DETECTED Final   Norovirus GI/GII 01/28/2022 NOT DETECTED  NOT DETECTED Final   Rotavirus A 01/28/2022 NOT DETECTED  NOT DETECTED Final   Sapovirus (I, II, IV, and V) 01/28/2022 NOT DETECTED  NOT DETECTED Final   Performed at [facility], [address] [place], [place] [address]   C Diff antigen 01/28/2022 NEGATIVE  NEGATIVE Final   C Diff toxin 01/28/2022 NEGATIVE  NEGATIVE Final   C Diff interpretation 01/28/2022 No C. difficile detected.   Final   Performed at [facility], [address] [place], [place] [address]   SARS Coronavirus 2 by RT PCR 01/28/2022 NEGATIVE  NEGATIVE Final   Comment: (NOTE) SARS-CoV-2 target nucleic acids are NOT DETECTED.  The SARS-CoV-2 RNA is generally detectable in upper respiratory specimens during the acute phase of infection. The lowest concentration of SARS-CoV-2 viral copies this assay can detect is 138 copies/mL. A negative result does not preclude SARS-Cov-2 infection and should not be used as the sole basis for treatment or other patient management decisions. A negative result may occur with  improper specimen collection/handling, submission of specimen other than nasopharyngeal swab, presence of viral mutation(s) within the areas targeted by this assay, and inadequate number of viral copies(<138 copies/mL). A negative result must be combined with clinical observations, patient history, and epidemiological information. The expected result is Negative.  Fact Sheet for Patients:  EntrepreneurPulse.com.au  Fact Sheet for Healthcare Providers:  IncredibleEmployment.be  This test is no                           t yet approved or cleared by the [place] FDA and  has been authorized for detection and/or diagnosis of SARS-CoV-2 by FDA under an Emergency Use Authorization (EUA). This EUA will remain  in effect (meaning this test can be used) for the duration of the COVID-19 declaration under Section 564(b)(1) of the Act, 21 U.S.C.section 360bbb-3(b)(1), unless the authorization is terminated  or revoked sooner.       Influenza A by PCR 01/28/2022 NEGATIVE  NEGATIVE Final   Influenza B by PCR 01/28/2022 NEGATIVE  NEGATIVE Final   Comment: (NOTE) The Xpert Xpress SARS-CoV-2/FLU/RSV plus assay is intended as an aid in the diagnosis of influenza from Nasopharyngeal swab specimens and should not be used as a sole basis for treatment. Nasal washings and aspirates are unacceptable for Xpert Xpress SARS-CoV-2/FLU/RSV testing.  Fact Sheet for Patients: EntrepreneurPulse.com.au  Fact Sheet for Healthcare Providers: IncredibleEmployment.be  This test is not yet approved or cleared by the [place] FDA and has been authorized for detection and/or diagnosis of SARS-CoV-2 by FDA under an Emergency Use Authorization (  EUA). This EUA will remain in effect (meaning this test can be used) for the duration of the COVID-19 declaration under Section 564(b)(1) of the Act, 21 U.S.C. section 360bbb-3(b)(1), unless the authorization is terminated or revoked.  Performed at [facility], [address] [place], [place] [address]     STUDIES: CT ABDOMEN PELVIS W CONTRAST  Result Date: 01/28/2022 CLINICAL DATA:  Nausea and vomiting. History of breast cancer with double mastectomy. Started on chemotherapy 21 days ago. Diarrhea since. Chills and nausea. * onc * EXAM: CT ABDOMEN AND PELVIS WITH CONTRAST TECHNIQUE: Multidetector CT imaging of the abdomen and pelvis was performed using the standard protocol following bolus administration of intravenous  contrast. RADIATION DOSE REDUCTION: This exam was performed according to the departmental dose-optimization program which includes automated exposure control, adjustment of the mA and/or kV according to patient size and/or use of iterative reconstruction technique. CONTRAST:  11m OMNIPAQUE IOHEXOL 300 MG/ML  SOLN COMPARISON:  12/25/2021 PET FINDINGS: Lower chest: Mild centrilobular emphysema. Mild cardiomegaly. Trace right pleural fluid. Hepatobiliary: Segment 4A subcentimeter low-density lesion is similar on the prior PET and was not FDG avid, favoring a cyst or minimally complex cyst. No suspicious liver lesion. Cholecystectomy, without biliary ductal dilatation. Pancreas: Normal, without mass or ductal dilatation. Spleen: Normal in size, without focal abnormality. Adrenals/Urinary Tract: Normal adrenal glands. Lower pole right renal 11 mm low-density lesion measures slightly greater than fluid density, favoring a minimally complex cyst. Example 44/2. Upper pole left renal 4.4 cm lesion measures slightly greater than fluid density at 24 HU on 25/2. On the order of 11 HU on prior PET noncontrast images. No hydronephrosis.  Normal urinary bladder. Stomach/Bowel: Atypical appearance of the posterior gastric cardia with contrast extending away from the remainder of the gastric lumen. Examples 17/2 and 1/7. No surrounding edema. The majority of the colon is fluid-filled, consistent with the clinical history of diarrhea. No evidence of colitis. Normal terminal ileum and appendix. Normal small bowel. Vascular/Lymphatic: Aortic atherosclerosis. Splenic artery aneurysm of 1.5 cm on 18/2. Possible left renal artery aneurysm at 7 mm on 31/2. No abdominopelvic adenopathy. Reproductive: Possible small uterine fundal fibroid including at 1.5 cm on sagittal image 70. Both ovaries are borderline prominent for age, including on the left at 3.0 cm on 72/2. Other: Trace pelvic fluid on 73/2. Subtle interstitial thickening within  the omentum, for example in the left side of the abdomen on 28/2 and right-side of the abdomen on 45/2. Present on the prior PET and not FDG avid. Musculoskeletal: Subtle left iliac sclerotic lesion of 8 mm on 61/2 was present on the prior PET and not FDG avid. Densely sclerotic lesions within the lower thoracic spine are likely bone islands. Lumbosacral spondylosis. IMPRESSION: 1. No specific explanation for abdominal pain. Fluid-filled colon, consistent with the clinical history of diarrhea. 2. Atypical appearance of the posterior gastric cardia. Contrast extending away from the central lumen, without surrounding edema. Although this could be due to underdistention and redundancy, ulcer could have this appearance. If this is a clinical concern, consider endoscopy. 3. Trace right pleural fluid. 4. Bilateral renal lesions which are technically indeterminate. Most likely minimally complex cysts. If the patient is scheduled for routine imaging surveillance, these could be re-evaluated on follow-up exams. If not, consider nonemergent dedicated pre and post contrast abdominal MRI. 5. Splenic artery and probable left renal artery aneurysms. 6. Prominent ovaries for age. Possibly within normal variation. Correlate with pelvic symptoms. Consider nonemergent ultrasound. 7. Trace nonspecific pelvic cul-de-sac  fluid. Concurrent non FDG avid subtle omental thickening. Recommend attention on follow-up to exclude unlikely early/mild peritoneal metastasis. 8. Suspect a small uterine fundal fibroid. 9. Aortic atherosclerosis (ICD10-I70.0) and emphysema (ICD10-J43.9). Electronically Signed   By: Abigail Miyamoto M.D.   On: 01/28/2022 10:20    ONCOLOGY HISTORY:  Bilateral adenocarcinoma of the breast. Patient is status post bilateral mastectomy in February 2015. She also received adjuvant chemotherapy with Adriamycin, Cytoxan, and Taxol completing treatment in approximately October 2015. She then initiated letrozole, but could not  tolerate secondary to leg pain and was switched to Aromasin.  Patient then switched to tamoxifen in October 2019 secondary to cost.  Patient subsequently discontinued tamoxifen and did not complete the recommended 5 years of treatment.   ASSESSMENT: Recurrent, metastatic ER/PR, HER2 negative invasive carcinoma of the breast.   PLAN:    1. Recurrent, metastatic ER/PR, HER2 negative invasive carcinoma of the breast: See oncology history as above.  Biopsy confirmed recurrent disease.  PET scan results from December 26, 2021 reviewed independently with hypermetabolic left supraclavicular, subpectoral, and axillary lymphadenopathy consistent with nodal recurrence of patient's history of breast cancer.  No distant metastases were identified.  Her most recent CA 27-29 was reported 260.1.  Continue Kisqali 600 mg daily for 21 days with 7 days off.  She will initially receive fulvestrant every 2 weeks as a loading dose and then once per month thereafter.  Proceed with treatment today.  Return to clinic in 2 weeks for further evaluation and continuation of fulvestrant.  At that point, patient can be transitioned to monthly treatments and evaluation.  Appreciate clinical pharmacy input. 2. Postmenopausal: Patient's last bone mineral density was on August 01, 2016 and revealed a T-score of -1.2. Continue monitoring bone mineral density by PCP. 3.  Multiple sclerosis: Chronic.  Continue evaluation and treatment per neurology.  4.  Esophageal discomfort/mild dysphagia: Recent colonoscopy and EGD did not reveal any significant pathology. 5.  Renal insufficiency: Patient admits to not drinking many fluids. Thelma Comp will need to be dose reduced if her GFR falls persistently below 30.  I spent a total of 30 minutes reviewing chart data, face-to-face evaluation with the patient, counseling and coordination of care as detailed above.      Patient expressed understanding and was in agreement with this plan. She also  understands that She can call clinic at any time with any questions, concerns, or complaints.   Breast cancer bilateral, multifocal ER/PR pos, Her 2 neg,.   Staging form: Breast, AJCC 7th Edition     Clinical: Stage IIB (T2, N1, M0) - Signed by Forest Gleason, MD on 04/22/2015   Lloyd Huger, MD   01/30/2022 9:30 AM

## 2022-02-05 ENCOUNTER — Inpatient Hospital Stay: Payer: Medicare Other

## 2022-02-05 ENCOUNTER — Inpatient Hospital Stay: Payer: Medicare Other | Admitting: Pharmacist

## 2022-02-05 ENCOUNTER — Other Ambulatory Visit: Payer: Self-pay

## 2022-02-05 ENCOUNTER — Inpatient Hospital Stay (HOSPITAL_BASED_OUTPATIENT_CLINIC_OR_DEPARTMENT_OTHER): Payer: Medicare Other | Admitting: Oncology

## 2022-02-05 VITALS — BP 147/76 | HR 94 | Temp 97.5°F | Resp 16 | Ht 64.0 in | Wt 200.8 lb

## 2022-02-05 DIAGNOSIS — C50919 Malignant neoplasm of unspecified site of unspecified female breast: Secondary | ICD-10-CM

## 2022-02-05 DIAGNOSIS — Z853 Personal history of malignant neoplasm of breast: Secondary | ICD-10-CM | POA: Diagnosis not present

## 2022-02-05 DIAGNOSIS — G35 Multiple sclerosis: Secondary | ICD-10-CM | POA: Diagnosis not present

## 2022-02-05 DIAGNOSIS — Z9221 Personal history of antineoplastic chemotherapy: Secondary | ICD-10-CM | POA: Diagnosis not present

## 2022-02-05 DIAGNOSIS — C773 Secondary and unspecified malignant neoplasm of axilla and upper limb lymph nodes: Secondary | ICD-10-CM | POA: Diagnosis not present

## 2022-02-05 DIAGNOSIS — Z9013 Acquired absence of bilateral breasts and nipples: Secondary | ICD-10-CM | POA: Diagnosis not present

## 2022-02-05 DIAGNOSIS — Z171 Estrogen receptor negative status [ER-]: Secondary | ICD-10-CM | POA: Diagnosis not present

## 2022-02-05 LAB — COMPREHENSIVE METABOLIC PANEL
ALT: 21 U/L (ref 0–44)
AST: 30 U/L (ref 15–41)
Albumin: 3.9 g/dL (ref 3.5–5.0)
Alkaline Phosphatase: 64 U/L (ref 38–126)
Anion gap: 11 (ref 5–15)
BUN: 16 mg/dL (ref 8–23)
CO2: 27 mmol/L (ref 22–32)
Calcium: 8.5 mg/dL — ABNORMAL LOW (ref 8.9–10.3)
Chloride: 103 mmol/L (ref 98–111)
Creatinine, Ser: 1.23 mg/dL — ABNORMAL HIGH (ref 0.44–1.00)
GFR, Estimated: 48 mL/min — ABNORMAL LOW (ref >60)
Glucose, Bld: 96 mg/dL (ref 70–99)
Potassium: 3.6 mmol/L (ref 3.5–5.1)
Sodium: 141 mmol/L (ref 135–145)
Total Bilirubin: 0.1 mg/dL — ABNORMAL LOW (ref 0.3–1.2)
Total Protein: 7.2 g/dL (ref 6.5–8.1)

## 2022-02-05 LAB — CBC WITH DIFFERENTIAL/PLATELET
Abs Immature Granulocytes: 0.01 10*3/uL (ref 0.00–0.07)
Basophils Absolute: 0.1 10*3/uL (ref 0.0–0.1)
Basophils Relative: 1 %
Eosinophils Absolute: 0.1 10*3/uL (ref 0.0–0.5)
Eosinophils Relative: 2 %
HCT: 31.7 % — ABNORMAL LOW (ref 36.0–46.0)
Hemoglobin: 10.6 g/dL — ABNORMAL LOW (ref 12.0–15.0)
Immature Granulocytes: 0 %
Lymphocytes Relative: 40 %
Lymphs Abs: 1.8 10*3/uL (ref 0.7–4.0)
MCH: 32 pg (ref 26.0–34.0)
MCHC: 33.4 g/dL (ref 30.0–36.0)
MCV: 95.8 fL (ref 80.0–100.0)
Monocytes Absolute: 0.8 10*3/uL (ref 0.1–1.0)
Monocytes Relative: 18 %
Neutro Abs: 1.7 10*3/uL (ref 1.7–7.7)
Neutrophils Relative %: 39 %
Platelets: 212 10*3/uL (ref 150–400)
RBC: 3.31 MIL/uL — ABNORMAL LOW (ref 3.87–5.11)
RDW: 13.7 % (ref 11.5–15.5)
WBC: 4.4 10*3/uL (ref 4.0–10.5)
nRBC: 0 % (ref 0.0–0.2)

## 2022-02-05 LAB — MAGNESIUM: Magnesium: 1.6 mg/dL — ABNORMAL LOW (ref 1.7–2.4)

## 2022-02-05 MED ORDER — FULVESTRANT 250 MG/5ML IM SOSY
500.0000 mg | PREFILLED_SYRINGE | Freq: Once | INTRAMUSCULAR | Status: AC
  Administered 2022-02-05: 500 mg via INTRAMUSCULAR
  Filled 2022-02-05: qty 10

## 2022-02-05 NOTE — Progress Notes (Signed)
Pt seen last week in ER for dehydration but feels much better this week. ?

## 2022-02-05 NOTE — Progress Notes (Signed)
? ?Oral Chemotherapy Clinic- Telephone Visit ?[facility]  ?[phone] [phone] ? ? ? ?I connected with Ms. Offield on 02/05/22 at 11:00 AM EDT by telephone and verified that I am speaking with the correct person using two identifiers. ? ?Name of the patient: Cassidy Norris  ?[id number]  ?[birth year]  ? ?Location: ?Patient: home ?Provider: in office ?  ?I discussed the limitations, risks, security and privacy concerns of performing an evaluation and management service by telephone and the availability of in person appointments. I also discussed with the patient that there may be a patient responsible charge related to this service. The patient expressed understanding and agreed to proceed. ? ?HPI: Patient is a 71 y.o. female with recurrent breast cancer, ER/PR positive/HER2 negative. Planned treatment with Kisqali (ribociclib) and fulvestrant. She received her fulvestrant on 01/07/22 and started her ribociclib on 01/08/22. Cycle 1 of ribociclib was complicated by diarrhea and dehydration.  ? ?Reason for Consult: Oral chemotherapy follow-up for ribociclib therapy. ? ? ?PAST MEDICAL HISTORY: ?Past Medical History:  ?Diagnosis Date  ? Anemia   ? Arthritis not really diagnosis but have pain  ? B12 deficiency 07/23/2016  ? Will check levels to determine if injections are needed again. Await results. Recent CBC at [facility]  ? Blood transfusion without reported diagnosis 1983 had a miscarriage/dnc  ? Cancer ([place]) 12/30/2013  ? Multifocal disease: pT1c,m, N0(isolated tumor cells) Er/ PR positive, Her 2 negative.  ? Cancer ([place]) 01/09/2014  ? Invasive lobular carcinoma, 2.2 cm, T2,N1 ER/PR positive, HER-2/neu negative  ? Cataract 2014?  ? Centrilobular emphysema ([place]) 10/27/2021  ? Diabetes mellitus without complication ([place])   ? Diffuse cystic mastopathy   ? Erosive esophagitis   ? Essential hypertension, benign 09/24/2017  ? Family history of malignant neoplasm of gastrointestinal tract 2012   ? Fractured elbow 08/28/2014  ? Gastroesophageal reflux disease   ? Hx of metabolic acidosis with increased anion gap   ? Increased heart rate   ? Multiple sclerosis ([place])   ? Neuromuscular disorder ([place])   ? neuropathy in bil feet - left is worse.  ? Obesity, unspecified   ? Peripheral neuropathy   ? Personal history of tobacco use, presenting hazards to health   ? Restless leg syndrome   ? Sleep apnea   ? uses CPAP  ? Special screening for malignant neoplasms, colon   ? ? ?HEMATOLOGY/ONCOLOGY HISTORY:  ?Oncology History Overview Note  ?1. Bilateral carcinoma of breast, February, 2015. Right breast status post radical mastectomy. T1 C. N0M0 (isolated tumor cells in one lymph node) multifocal invasive cancer. ?Stage IC, Left breast, Status post radical mastectomy, T2, N1, M0 tumor. ?Stage II, RIght breast. Both tumors are estrogen receptor positive.  Progesterone receptor positive.  HER-2 receptor negative by FISH. ?Negative for BRCA mutation.  ?2. Started on Adriamycin and Cytoxan on March 01, 2014. ?Last cycle of Cytoxan and Adriamycin on May 01, 2014. ?Patient has finished last chemotherapy on September 9. (12 th dose was omitted because of neuropathy) ?3.  Taking tamoxifen.  Patient had a poor tolerance to letrozole.(October, 2015) ?4.  Patient is taking letrozole since November of 2015 ?5.  May, 2016 ?Letrozole has been put on hold because of bony pains ?6.  Started on Aromasin from July of 2016 ? ?  ?History of bilateral breast cancer (Resolved)  ?11/29/2013 Initial Diagnosis  ? Breast cancer bilateral, multifocal ER/PR pos, Her 2 neg,. ?  ? ? ?ALLERGIES:  has No Known Allergies. ? ?MEDICATIONS:  ?  Current Outpatient Medications  ?Medication Sig Dispense Refill  ? acetaminophen (TYLENOL) 500 MG tablet Take 1,000 mg by mouth 2 (two) times daily as needed for moderate pain or headache.    ? baclofen (LIORESAL) 10 MG tablet Take 1 tablet (10 mg total) by mouth 3 (three) times daily as needed for muscle spasms. 1080  tablet 0  ? calcium carbonate (OSCAL) 1500 (600 Ca) MG TABS tablet Take by mouth 2 (two) times daily with a meal.    ? diclofenac Sodium (VOLTAREN) 1 % GEL Apply 2 g topically 3 (three) times daily as needed. 100 g 2  ? fluticasone (FLONASE) 50 MCG/ACT nasal spray PLACE 2 SPRAYS INTO BOTH NOSTRILS AT BEDTIME. 48 g 1  ? Krill Oil 1000 MG CAPS Take by mouth.    ? lisinopril (ZESTRIL) 5 MG tablet TAKE 1 TABLET BY MOUTH EVERY EVENING 90 tablet 3  ? loperamide (IMODIUM A-D) 2 MG tablet Take 2 tablets (4 mg total) by mouth 4 (four) times daily as needed for diarrhea or loose stools. 30 tablet 0  ? loperamide (IMODIUM) 2 MG capsule Take by mouth as needed.    ? Melatonin 10 MG CAPS Take by mouth.    ? metFORMIN (GLUCOPHAGE) 500 MG tablet TAKE 1 TABLET BY MOUTH 2 TIMES DAILY WITH A MEAL. 180 tablet 2  ? omeprazole (PRILOSEC) 40 MG capsule Take 1 capsule (40 mg total) by mouth 2 (two) times daily before a meal. 60 capsule 3  ? pantoprazole (PROTONIX) 40 MG tablet Take 1 tablet (40 mg total) by mouth daily. 30 tablet 0  ? polyethylene glycol powder (GLYCOLAX/MIRALAX) powder Take 17 g by mouth 2 (two) times daily as needed for moderate constipation. 3350 g 1  ? prochlorperazine (COMPAZINE) 10 MG tablet Take 1 tablet (10 mg total) by mouth every 6 (six) hours as needed for nausea or vomiting. 30 tablet 0  ? ribociclib succ (KISQALI, 600 MG DOSE,) 200 MG Therapy Pack Take 3 tablets (600 mg total) by mouth daily. Take for 21 days on, 7 days off, repeat every 28 days. 63 tablet 0  ? traZODone (DESYREL) 50 MG tablet Take 50 mg by mouth at bedtime.    ? venlafaxine XR (EFFEXOR-XR) 75 MG 24 hr capsule Take 1 capsule (75 mg total) by mouth daily with breakfast. 360 capsule 0  ? vitamin B-12 (CYANOCOBALAMIN) 1000 MCG tablet Take 2,500 mcg by mouth daily.    ? Vitamin D, Cholecalciferol, 50 MCG (2000 UT) CAPS Take 1 capsule by mouth every 3 (three) days.    ? ?No current facility-administered medications for this visit.  ? ? ?VITAL  SIGNS: ?LMP 12/22/1999 (Approximate)  ?There were no vitals filed for this visit.  ?Estimated body mass index is 34.47 kg/m? as calculated from the following: ?  Height as of an earlier encounter on 02/05/22: 5' 4" (1.626 m). ?  Weight as of an earlier encounter on 02/05/22: 91.1 kg (200 lb 12.8 oz). ? ?LABS: ?CBC: ?   ?Component Value Date/Time  ? WBC 4.4 02/05/2022 1000  ? HGB 10.6 (L) 02/05/2022 1000  ? HGB 15.5 07/13/2017 0937  ? HCT 31.7 (L) 02/05/2022 1000  ? HCT 46.7 (H) 07/13/2017 4742  ? PLT 212 02/05/2022 1000  ? PLT 211 07/13/2017 0937  ? MCV 95.8 02/05/2022 1000  ? MCV 94 07/13/2017 0937  ? MCV 93 03/11/2015 1356  ? NEUTROABS 1.7 02/05/2022 1000  ? NEUTROABS 5.4 07/13/2017 0937  ? NEUTROABS 5.6 03/11/2015 1356  ?  LYMPHSABS 1.8 02/05/2022 1000  ? LYMPHSABS 2.1 07/13/2017 0937  ? LYMPHSABS 2.3 03/11/2015 1356  ? MONOABS 0.8 02/05/2022 1000  ? MONOABS 1.0 (H) 03/11/2015 1356  ? EOSABS 0.1 02/05/2022 1000  ? EOSABS 0.2 07/13/2017 0937  ? EOSABS 0.3 03/11/2015 1356  ? BASOSABS 0.1 02/05/2022 1000  ? BASOSABS 0.0 07/13/2017 0937  ? BASOSABS 0.1 03/11/2015 1356  ? ?Comprehensive Metabolic Panel: ?   ?Component Value Date/Time  ? NA 141 02/05/2022 1000  ? NA 142 07/14/2019 1516  ? NA 139 03/11/2015 1356  ? K 3.6 02/05/2022 1000  ? K 3.4 (L) 03/11/2015 1356  ? CL 103 02/05/2022 1000  ? CL 103 03/11/2015 1356  ? CO2 27 02/05/2022 1000  ? CO2 28 03/11/2015 1356  ? BUN 16 02/05/2022 1000  ? BUN 12 07/14/2019 1516  ? BUN 13 03/11/2015 1356  ? CREATININE 1.23 (H) 02/05/2022 1000  ? CREATININE 0.85 10/20/2021 0838  ? GLUCOSE 96 02/05/2022 1000  ? GLUCOSE 118 (H) 03/11/2015 1356  ? CALCIUM 8.5 (L) 02/05/2022 1000  ? CALCIUM 9.2 03/11/2015 1356  ? AST 30 02/05/2022 1000  ? AST 28 03/11/2015 1356  ? ALT 21 02/05/2022 1000  ? ALT 32 03/11/2015 1356  ? ALKPHOS 64 02/05/2022 1000  ? ALKPHOS 68 03/11/2015 1356  ? BILITOT 0.1 (L) 02/05/2022 1000  ? BILITOT 0.4 07/14/2019 1516  ? BILITOT 0.7 03/11/2015 1356  ? PROT 7.2 02/05/2022  1000  ? PROT 6.9 07/14/2019 1516  ? PROT 6.9 03/11/2015 1356  ? ALBUMIN 3.9 02/05/2022 1000  ? ALBUMIN 4.5 07/14/2019 1516  ? ALBUMIN 4.2 03/11/2015 1356  ? ? ?On phone during today's visit: patient only

## 2022-02-06 LAB — CANCER ANTIGEN 27.29: CA 27.29: 212.5 U/mL — ABNORMAL HIGH (ref 0.0–38.6)

## 2022-02-07 DIAGNOSIS — Z20822 Contact with and (suspected) exposure to covid-19: Secondary | ICD-10-CM | POA: Diagnosis not present

## 2022-02-08 NOTE — Discharge Instructions (Signed)
Instructions after Total Knee Replacement   Cassidy Norris P. Markesia Crilly, Jr., M.D.     Dept. of Orthopaedics & Sports Medicine  [facility]  [address]  [place], [place]  [address]  Phone: [phone]   Fax: [phone]    DIET: Drink plenty of non-alcoholic fluids. Resume your normal diet. Include foods high in fiber.  ACTIVITY:  You may use crutches or a walker with weight-bearing as tolerated, unless instructed otherwise. You may be weaned off of the walker or crutches by your Physical Therapist.  Do NOT place pillows under the knee. Anything placed under the knee could limit your ability to straighten the knee.   Continue doing gentle exercises. Exercising will reduce the pain and swelling, increase motion, and prevent muscle weakness.   Please continue to use the TED compression stockings for 6 weeks. You may remove the stockings at night, but should reapply them in the morning. Do not drive or operate any equipment until instructed.  WOUND CARE:  Continue to use the PolarCare or ice packs periodically to reduce pain and swelling. You may bathe or shower after the staples are removed at the first office visit following surgery.  MEDICATIONS: You may resume your regular medications. Please take the pain medication as prescribed on the medication. Do not take pain medication on an empty stomach. You have been given a prescription for a blood thinner (Lovenox or Coumadin). Please take the medication as instructed. (NOTE: After completing a 2 week course of Lovenox, take one Enteric-coated aspirin once a day. This along with elevation will help reduce the possibility of phlebitis in your operated leg.) Do not drive or drink alcoholic beverages when taking pain medications.  CALL THE OFFICE FOR: Temperature above 101 degrees Excessive bleeding or drainage on the dressing. Excessive swelling, coldness, or paleness of the toes. Persistent nausea and vomiting.  FOLLOW-UP:  You  should have an appointment to return to the office in 10-14 days after surgery. Arrangements have been made for continuation of Physical Therapy (either home therapy or outpatient therapy).   [facility] Department Directory         [email or web address]       [email or web address]          Cardiology  Appointments: [place] - [phone] [place] - [phone]  Endocrinology  Appointments: [place] - [phone] [place] - [phone]  Gastroenterology  Appointments: [place] - [phone] [place] - [phone]        General Surgery   Appointments: [place] - [phone]  Internal Medicine/Family Medicine  Appointments: [place] - [phone] [place] - [phone] [place] - [phone]  Metabolic and Weigh Loss Surgery  Appointments: [place] - [phone]        Neurology  Appointments: [place] - [phone] [place] - [phone]  Neurosurgery  Appointments: [place] - [phone]  Obstetrics & Gynecology  Appointments: [place] - [phone] [place] - [phone]        Pediatrics  Appointments: [place] - [phone] [place] - [phone]  Physiatry  Appointments: [place] -[phone]  Physical Therapy  Appointments: [place] - [phone] [place] - [phone]        Podiatry  Appointments: [place] - [phone] [place] - [phone]  Pulmonology  Appointments: [place] - [phone]  Rheumatology  Appointments: [place] - [phone]         Location: [facility]  [address] , [place]  [address]  [place] Location: [facility] [address] [place], [place]  [address]  [place] Location: [facility] [address] [place], [place]  [address]    

## 2022-02-11 ENCOUNTER — Other Ambulatory Visit: Payer: Medicare Other

## 2022-02-12 ENCOUNTER — Inpatient Hospital Stay (HOSPITAL_BASED_OUTPATIENT_CLINIC_OR_DEPARTMENT_OTHER): Payer: Medicare Other | Admitting: Pharmacist

## 2022-02-12 DIAGNOSIS — C50919 Malignant neoplasm of unspecified site of unspecified female breast: Secondary | ICD-10-CM

## 2022-02-12 NOTE — Progress Notes (Signed)
? ?Oral Chemotherapy Clinic- Telephone Visit ?[facility]  ?Telephone:([phone] [phone] ? ? ? ?I connected with Cassidy Norris on 02/12/22 at 11:00 AM EDT by telephone and verified that I am speaking with the correct person using two identifiers. ? ?Name of the patient: Cassidy Norris  ?[id number]  ?1951-03-14  ? ?Location: ?Patient: at home ?Provider: in office ?  ?I discussed the limitations, risks, security and privacy concerns of performing an evaluation and management service by telephone and the availability of in person appointments. I also discussed with the patient that there may be a patient responsible charge related to this service. The patient expressed understanding and agreed to proceed. ? ?HPI: Patient is a 71 y.o. female with recurrent breast cancer, ER/PR positive/HER2 negative. Planned treatment with Kisqali (ribociclib) and fulvestrant. She received her fulvestrant on 01/07/22 and started her ribociclib on 01/08/22. Cycle 1 of ribociclib was complicated by diarrhea and dehydration ? ?Reason for Consult: Oral chemotherapy follow-up for ribociclib therapy. ? ? ?PAST MEDICAL HISTORY: ?Past Medical History:  ?Diagnosis Date  ? Anemia   ? Arthritis not really diagnosis but have pain  ? B12 deficiency 07/23/2016  ? Will check levels to determine if injections are needed again. Await results. Recent CBC at Onc WNL  ? Blood transfusion without reported diagnosis 1983 had a miscarriage/dnc  ? Cancer ([place]) 12/30/2013  ? Multifocal disease: pT1c,m, N0(isolated tumor cells) Er/ PR positive, Her 2 negative.  ? Cancer ([place]) 01/09/2014  ? Invasive lobular carcinoma, 2.2 cm, T2,N1 ER/PR positive, HER-2/neu negative  ? Cataract 2014?  ? Centrilobular emphysema ([place]) 10/27/2021  ? Diabetes mellitus without complication ([place])   ? Diffuse cystic mastopathy   ? Erosive esophagitis   ? Essential hypertension, benign 09/24/2017  ? Family history of malignant neoplasm of gastrointestinal tract 2012   ? Fractured elbow 08/28/2014  ? Gastroesophageal reflux disease   ? Hx of metabolic acidosis with increased anion gap   ? Increased heart rate   ? Multiple sclerosis ([place])   ? Neuromuscular disorder ([place])   ? neuropathy in bil feet - left is worse.  ? Obesity, unspecified   ? Peripheral neuropathy   ? Personal history of tobacco use, presenting hazards to health   ? Restless leg syndrome   ? Sleep apnea   ? uses CPAP  ? Special screening for malignant neoplasms, colon   ? ? ?HEMATOLOGY/ONCOLOGY HISTORY:  ?Oncology History Overview Note  ?1. Bilateral carcinoma of breast, February, 2015. Right breast status post radical mastectomy. T1 C. N0M0 (isolated tumor cells in one lymph node) multifocal invasive cancer. ?Stage IC, Left breast, Status post radical mastectomy, T2, N1, M0 tumor. ?Stage II, RIght breast. Both tumors are estrogen receptor positive.  Progesterone receptor positive.  HER-2 receptor negative by FISH. ?Negative for BRCA mutation.  ?2. Started on Adriamycin and Cytoxan on March 01, 2014. ?Last cycle of Cytoxan and Adriamycin on May 01, 2014. ?Patient has finished last chemotherapy on September 9. (12 th dose was omitted because of neuropathy) ?3.  Taking tamoxifen.  Patient had a poor tolerance to letrozole.(October, 2015) ?4.  Patient is taking letrozole since November of 2015 ?5.  May, 2016 ?Letrozole has been put on hold because of bony pains ?6.  Started on Aromasin from July of 2016 ? ?  ?History of bilateral breast cancer (Resolved)  ?11/29/2013 Initial Diagnosis  ? Breast cancer bilateral, multifocal ER/PR pos, Her 2 neg,. ?  ? ? ?ALLERGIES:  has No Known Allergies. ? ?MEDICATIONS:  ?  Current Outpatient Medications  ?Medication Sig Dispense Refill  ? acetaminophen (TYLENOL) 500 MG tablet Take 1,000 mg by mouth 2 (two) times daily as needed for moderate pain or headache.    ? baclofen (LIORESAL) 10 MG tablet Take 1 tablet (10 mg total) by mouth 3 (three) times daily as needed for muscle spasms. 1080  tablet 0  ? calcium carbonate (OSCAL) 1500 (600 Ca) MG TABS tablet Take by mouth 2 (two) times daily with a meal.    ? diclofenac Sodium (VOLTAREN) 1 % GEL Apply 2 g topically 3 (three) times daily as needed. 100 g 2  ? fluticasone (FLONASE) 50 MCG/ACT nasal spray PLACE 2 SPRAYS INTO BOTH NOSTRILS AT BEDTIME. 48 g 1  ? Krill Oil 1000 MG CAPS Take by mouth.    ? lisinopril (ZESTRIL) 5 MG tablet TAKE 1 TABLET BY MOUTH EVERY EVENING 90 tablet 3  ? loperamide (IMODIUM A-D) 2 MG tablet Take 2 tablets (4 mg total) by mouth 4 (four) times daily as needed for diarrhea or loose stools. 30 tablet 0  ? loperamide (IMODIUM) 2 MG capsule Take by mouth as needed.    ? Melatonin 10 MG CAPS Take by mouth.    ? metFORMIN (GLUCOPHAGE) 500 MG tablet TAKE 1 TABLET BY MOUTH 2 TIMES DAILY WITH A MEAL. 180 tablet 2  ? omeprazole (PRILOSEC) 40 MG capsule Take 1 capsule (40 mg total) by mouth 2 (two) times daily before a meal. 60 capsule 3  ? pantoprazole (PROTONIX) 40 MG tablet Take 1 tablet (40 mg total) by mouth daily. 30 tablet 0  ? polyethylene glycol powder (GLYCOLAX/MIRALAX) powder Take 17 g by mouth 2 (two) times daily as needed for moderate constipation. 3350 g 1  ? prochlorperazine (COMPAZINE) 10 MG tablet Take 1 tablet (10 mg total) by mouth every 6 (six) hours as needed for nausea or vomiting. 30 tablet 0  ? ribociclib succ (KISQALI, 600 MG DOSE,) 200 MG Therapy Pack Take 3 tablets (600 mg total) by mouth daily. Take for 21 days on, 7 days off, repeat every 28 days. 63 tablet 0  ? traZODone (DESYREL) 50 MG tablet Take 50 mg by mouth at bedtime.    ? venlafaxine XR (EFFEXOR-XR) 75 MG 24 hr capsule Take 1 capsule (75 mg total) by mouth daily with breakfast. 360 capsule 0  ? vitamin B-12 (CYANOCOBALAMIN) 1000 MCG tablet Take 2,500 mcg by mouth daily.    ? Vitamin D, Cholecalciferol, 50 MCG (2000 UT) CAPS Take 1 capsule by mouth every 3 (three) days.    ? ?No current facility-administered medications for this visit.  ? ? ?VITAL  SIGNS: ?LMP 12/22/1999 (Approximate)  ?There were no vitals filed for this visit.  ?Estimated body mass index is 34.47 kg/m? as calculated from the following: ?  Height as of 02/05/22: 5' 4"  (1.626 m). ?  Weight as of 02/05/22: 91.1 kg (200 lb 12.8 oz). ? ?LABS: ?CBC: ?   ?Component Value Date/Time  ? WBC 4.4 02/05/2022 1000  ? HGB 10.6 (L) 02/05/2022 1000  ? HGB 15.5 07/13/2017 0937  ? HCT 31.7 (L) 02/05/2022 1000  ? HCT 46.7 (H) 07/13/2017 3846  ? PLT 212 02/05/2022 1000  ? PLT 211 07/13/2017 0937  ? MCV 95.8 02/05/2022 1000  ? MCV 94 07/13/2017 0937  ? MCV 93 03/11/2015 1356  ? NEUTROABS 1.7 02/05/2022 1000  ? NEUTROABS 5.4 07/13/2017 0937  ? NEUTROABS 5.6 03/11/2015 1356  ? LYMPHSABS 1.8 02/05/2022 1000  ? LYMPHSABS 2.1  07/13/2017 1747  ? LYMPHSABS 2.3 03/11/2015 1356  ? MONOABS 0.8 02/05/2022 1000  ? MONOABS 1.0 (H) 03/11/2015 1356  ? EOSABS 0.1 02/05/2022 1000  ? EOSABS 0.2 07/13/2017 0937  ? EOSABS 0.3 03/11/2015 1356  ? BASOSABS 0.1 02/05/2022 1000  ? BASOSABS 0.0 07/13/2017 0937  ? BASOSABS 0.1 03/11/2015 1356  ? ?Comprehensive Metabolic Panel: ?   ?Component Value Date/Time  ? NA 141 02/05/2022 1000  ? NA 142 07/14/2019 1516  ? NA 139 03/11/2015 1356  ? K 3.6 02/05/2022 1000  ? K 3.4 (L) 03/11/2015 1356  ? CL 103 02/05/2022 1000  ? CL 103 03/11/2015 1356  ? CO2 27 02/05/2022 1000  ? CO2 28 03/11/2015 1356  ? BUN 16 02/05/2022 1000  ? BUN 12 07/14/2019 1516  ? BUN 13 03/11/2015 1356  ? CREATININE 1.23 (H) 02/05/2022 1000  ? CREATININE 0.85 10/20/2021 0838  ? GLUCOSE 96 02/05/2022 1000  ? GLUCOSE 118 (H) 03/11/2015 1356  ? CALCIUM 8.5 (L) 02/05/2022 1000  ? CALCIUM 9.2 03/11/2015 1356  ? AST 30 02/05/2022 1000  ? AST 28 03/11/2015 1356  ? ALT 21 02/05/2022 1000  ? ALT 32 03/11/2015 1356  ? ALKPHOS 64 02/05/2022 1000  ? ALKPHOS 68 03/11/2015 1356  ? BILITOT 0.1 (L) 02/05/2022 1000  ? BILITOT 0.4 07/14/2019 1516  ? BILITOT 0.7 03/11/2015 1356  ? PROT 7.2 02/05/2022 1000  ? PROT 6.9 07/14/2019 1516  ? PROT 6.9  03/11/2015 1356  ? ALBUMIN 3.9 02/05/2022 1000  ? ALBUMIN 4.5 07/14/2019 1516  ? ALBUMIN 4.2 03/11/2015 1356  ? ? ?On phone during today's visit: patient only ? ?Assessment and Plan-  ?Continue ribociclib

## 2022-02-14 DIAGNOSIS — Z20822 Contact with and (suspected) exposure to covid-19: Secondary | ICD-10-CM | POA: Diagnosis not present

## 2022-02-23 ENCOUNTER — Encounter
Admission: RE | Admit: 2022-02-23 | Discharge: 2022-02-23 | Disposition: A | Discharge status: Home / Self Care | Payer: Medicare Other | Source: Ambulatory Visit | Attending: Orthopedic Surgery | Admitting: Orthopedic Surgery

## 2022-02-23 VITALS — BP 143/72 | HR 106 | Temp 98.0°F | Resp 16 | Ht 64.0 in | Wt 192.0 lb

## 2022-02-23 DIAGNOSIS — C50919 Malignant neoplasm of unspecified site of unspecified female breast: Secondary | ICD-10-CM

## 2022-02-23 DIAGNOSIS — E785 Hyperlipidemia, unspecified: Secondary | ICD-10-CM

## 2022-02-23 DIAGNOSIS — R7989 Other specified abnormal findings of blood chemistry: Secondary | ICD-10-CM | POA: Diagnosis not present

## 2022-02-23 DIAGNOSIS — Z01818 Encounter for other preprocedural examination: Secondary | ICD-10-CM | POA: Insufficient documentation

## 2022-02-23 DIAGNOSIS — E1169 Type 2 diabetes mellitus with other specified complication: Secondary | ICD-10-CM | POA: Diagnosis not present

## 2022-02-23 DIAGNOSIS — M1711 Unilateral primary osteoarthritis, right knee: Secondary | ICD-10-CM | POA: Diagnosis not present

## 2022-02-23 DIAGNOSIS — Z01812 Encounter for preprocedural laboratory examination: Secondary | ICD-10-CM

## 2022-02-23 DIAGNOSIS — R8290 Unspecified abnormal findings in urine: Secondary | ICD-10-CM

## 2022-02-23 LAB — URINALYSIS, ROUTINE W REFLEX MICROSCOPIC
Bilirubin Urine: NEGATIVE
Glucose, UA: NEGATIVE mg/dL
Hgb urine dipstick: NEGATIVE
Ketones, ur: NEGATIVE mg/dL
Nitrite: POSITIVE — AB
Protein, ur: NEGATIVE mg/dL
Specific Gravity, Urine: 1.019 (ref 1.005–1.030)
pH: 5 (ref 5.0–8.0)

## 2022-02-23 LAB — SURGICAL PCR SCREEN
MRSA, PCR: NEGATIVE
Staphylococcus aureus: NEGATIVE

## 2022-02-23 LAB — COMPREHENSIVE METABOLIC PANEL
ALT: 27 U/L (ref 0–44)
AST: 33 U/L (ref 15–41)
Albumin: 4.1 g/dL (ref 3.5–5.0)
Alkaline Phosphatase: 64 U/L (ref 38–126)
Anion gap: 11 (ref 5–15)
BUN: 21 mg/dL (ref 8–23)
CO2: 27 mmol/L (ref 22–32)
Calcium: 9.3 mg/dL (ref 8.9–10.3)
Chloride: 102 mmol/L (ref 98–111)
Creatinine, Ser: 1.4 mg/dL — ABNORMAL HIGH (ref 0.44–1.00)
GFR, Estimated: 41 mL/min — ABNORMAL LOW (ref >60)
Glucose, Bld: 112 mg/dL — ABNORMAL HIGH (ref 70–99)
Potassium: 3.9 mmol/L (ref 3.5–5.1)
Sodium: 140 mmol/L (ref 135–145)
Total Bilirubin: 0.9 mg/dL (ref 0.3–1.2)
Total Protein: 7.6 g/dL (ref 6.5–8.1)

## 2022-02-23 LAB — CBC
HCT: 31.5 % — ABNORMAL LOW (ref 36.0–46.0)
Hemoglobin: 10.4 g/dL — ABNORMAL LOW (ref 12.0–15.0)
MCH: 32 pg (ref 26.0–34.0)
MCHC: 33 g/dL (ref 30.0–36.0)
MCV: 96.9 fL (ref 80.0–100.0)
Platelets: 270 10*3/uL (ref 150–400)
RBC: 3.25 MIL/uL — ABNORMAL LOW (ref 3.87–5.11)
RDW: 15 % (ref 11.5–15.5)
WBC: 5 10*3/uL (ref 4.0–10.5)
nRBC: 0 % (ref 0.0–0.2)

## 2022-02-23 LAB — SEDIMENTATION RATE: Sed Rate: 49 mm/hr — ABNORMAL HIGH (ref 0–30)

## 2022-02-23 LAB — C-REACTIVE PROTEIN: CRP: <0.5 mg/dL (ref <1.0)

## 2022-02-23 NOTE — Patient Instructions (Addendum)
?Your procedure is scheduled on: Friday March 06, 2022. ?Report to Day Surgery inside [facility] 2nd floor, stop by admissions desk before getting on elevator.  ?To find out your arrival time please call [phone] between 1PM - 3PM on Thursday March 05, 2022. ? ?Remember: Instructions that are not followed completely may result in serious medical risk,  ?up to and including death, or upon the discretion of your surgeon and anesthesiologist your  ?surgery may need to be rescheduled.  ? ?  _X__ 1. Do not eat food after midnight the night before your procedure. ?                No chewing gum or hard candies. You may drink clear liquids up to 2 hours ?                before you are scheduled to arrive for your surgery- DO not drink clear ?                liquids within 2 hours of the start of your surgery. ?                Clear Liquids include:  water, apple juice without pulp, clear Gatorade, G2 or  ?                Gatorade Zero (avoid Red/Purple/Blue), Black Coffee or Tea (Do not add ?                anything to coffee or tea). ? ?__X__2.   Complete the "Ensure Clear Pre-surgery Clear Carbohydrate Drink" provided to you, 2 hours before arrival. **If you are diabetic you will be provided with an alternative drink, Gatorade Zero or G2. ? ?__X__3.  On the morning of surgery brush your teeth with toothpaste and water, you ?               may rinse your mouth with mouthwash if you wish.  Do not swallow any toothpaste of mouthwash. ?   ? _X__ 4.  No Alcohol for 24 hours before or after surgery. ? ? _X__ 5.  Do Not Smoke or use e-cigarettes For 24 Hours Prior to Your Surgery. ?                Do not use any chewable tobacco products for at least 6 hours prior to ?                Surgery. ? ?_X__  6.  Do not use any recreational drugs (marijuana, cocaine, heroin, ecstasy, MDMA or other) ?               For at least one week prior to your surgery.  Combination of these drugs with anesthesia ?                May have life threatening results. ? ?____  7.  Bring all medications with you on the day of surgery if instructed.  ? ?__X_ 8.  Notify your doctor if there is any change in your medical condition  ?    (cold, fever, infections). ?    ?Do not wear jewelry, make-up, hairpins, clips or nail polish. ?Do not wear lotions, powders, or perfumes. You may wear deodorant. ?Do not shave 48 hours prior to surgery. Men may shave face and neck. ?Do not bring valuables to the hospital.   ? ?[facility] is not responsible for any belongings or valuables. ? ?  Contacts, dentures or bridgework may not be worn into surgery. ?Leave your suitcase in the car. After surgery it may be brought to your room. ?For patients admitted to the hospital, discharge time is determined by your ?treatment team. ?  ?Patients discharged the day of surgery will not be allowed to drive home.   ?Make arrangements for someone to be with you for the first 24 hours of your ?Same Day Discharge. ? ?  ?Please read over the following fact sheets that you were given:  ? Total Joint Packet  ? ? ?__X__ Take these medicines the morning of surgery with A SIP OF WATER:  ? ? 1. famotidine (PEPCID) 20 MG ? 2. venlafaxine XR (EFFEXOR-XR) 75 MG ? 3. ? 4. ? 5. ? 6. ? ?____ Fleet Enema (as directed)  ? ?__X__ Use CHG Soap (or wipes) as directed ? ?____ Use Benzoyl Peroxide Gel as instructed ? ?____ Use inhalers on the day of surgery ? ?__X__ Stop metFORMIN (GLUCOPHAGE) 500 2 days prior to surgery (stop 4/11//23   ? ?____ Take 1/2 of usual insulin dose the night before surgery. No insulin the morning ?         of surgery.  ? ?____ Call your PCP, cardiologist, or Pulmonologist if taking Coumadin/Plavix/aspirin and ask when to stop before your surgery.  ? ?__X__ One Week prior to surgery- Stop Anti-inflammatories such as Ibuprofen, Aleve, Advil, Motrin, meloxicam (MOBIC), diclofenac, etodolac, ketorolac, Toradol, Daypro, piroxicam, Goody's or BC powders. OK TO USE  TYLENOL IF NEEDED ?  ?__X__ one week prior to surgery Stop ALL vitamins, supplements and over the counter medications until after surgery. Krill Oil  ? ?____ Bring C-Pap to the hospital.  ? ? ?If you have any questions regarding your pre-procedure instructions,  ?Please call Pre-admit Testing at [phone] ?

## 2022-02-24 DIAGNOSIS — M1711 Unilateral primary osteoarthritis, right knee: Secondary | ICD-10-CM | POA: Diagnosis not present

## 2022-02-24 LAB — HEMOGLOBIN A1C
Hgb A1c MFr Bld: 6.2 % — ABNORMAL HIGH (ref 4.8–5.6)
Mean Plasma Glucose: 131 mg/dL

## 2022-02-25 NOTE — Progress Notes (Signed)
?  [facility] ?Perioperative Services: Pre-Admission/Anesthesia Testing ? ?Abnormal Lab Notification ?  ?Date: 02/25/22 ? ?Name: Cassidy Norris ?MRN:   [id number] ? ?Re: Abnormal labs noted during PAT appointment  ? ?Notified:  ?Provider Name Provider Role Notification Mode  ?Skip Estimable, MD Orthopedics (Surgeon) Routed and/or faxed via [facility]  ? ?Abnormal Lab Value(s):  ? ?Lab Results  ?Component Value Date  ? COLORURINE YELLOW (A) 02/23/2022  ? APPEARANCEUR HAZY (A) 02/23/2022  ? LABSPEC 1.019 02/23/2022  ? PHURINE 5.0 02/23/2022  ? GLUCOSEU NEGATIVE 02/23/2022  ? [place] NEGATIVE 02/23/2022  ? [place] NEGATIVE 02/23/2022  ? [place] NEGATIVE 02/23/2022  ? PROTEINUR NEGATIVE 02/23/2022  ? NITRITE POSITIVE (A) 02/23/2022  ? LEUKOCYTESUR SMALL (A) 02/23/2022  ? EPIU 0-5 02/23/2022  ? WBCU 11-20 02/23/2022  ? RBCU 0-5 02/23/2022  ? BACTERIA MANY (A) 02/23/2022  ? CULT (A) 02/23/2022  ?  >=100,000 COLONIES/mL GRAM NEGATIVE RODS ?SUSCEPTIBILITIES TO FOLLOW ?Performed at [facility], [place] [address], [place], [place] [address] ?  ? ?Clinical Information and Notes:  ?Patient is scheduled for COMPUTER ASSISTED TOTAL KNEE ARTHROPLASTY - RNFA (Right: Knee) on 03/06/2022.   ? ?UA performed in PAT consistent with/infection.  ?No leukocytosis noted on CBC; WBC 5000 ?Renal function: Estimated Creatinine Clearance: 40.5 mL/min (A) (by C-G formula based on SCr of 1.4 mg/dL (H)). ?Urine C&S added to assess for pathogenically significant growth.  ? ?Impression and Plan:  ?[name] with a UA that was (+) for infection; reflex culture sent.  Luminary culture (+) for significant GNR colony count; final result pending. Will plan on forwarding final culture results to MD as they become available to me. Sending results for review and consideration of preoperative treatment as deemed appropriate by Dr. Marry Guan.  ? ?This is a Water quality scientist. No formal response is required. ? ?Honor Loh, MSN,  APRN, FNP-C, CEN ?[facility]  ?Peri-operative Services Nurse Practitioner ?Phone: [phone] ?Fax: [phone] ?02/25/22 10:21 AM ? ?NOTE: This note has been prepared using [employer]. Despite my best ability to proofread, there is always the potential that unintentional transcriptional errors may still occur from this process. ? ?

## 2022-02-26 ENCOUNTER — Telehealth: Payer: Self-pay | Admitting: Pharmacy Technician

## 2022-02-26 LAB — URINE CULTURE: Culture: >=100000 — AB

## 2022-02-26 NOTE — Telephone Encounter (Signed)
Oral Oncology Patient Advocate Encounter ?  ?Was successful in securing patient a $9000 grant from [employer] (PAF) to provide reimbursement for Medicare premiums.   ?  ?I have spoken with the patient.   ? ?Member ID: [id number] ?Dates of Eligibility: 08/29/21 through 02/26/23 ? ?Fund: Metastatic Breast Cancer ? ?Dennison Nancy CPHT ?Specialty Pharmacy Patient Advocate ?[place] ?Phone [phone] ?Fax [phone] ?02/26/2022 9:25 AM ? ? ?

## 2022-02-27 DIAGNOSIS — Z20828 Contact with and (suspected) exposure to other viral communicable diseases: Secondary | ICD-10-CM | POA: Diagnosis not present

## 2022-03-01 ENCOUNTER — Other Ambulatory Visit: Payer: Self-pay | Admitting: Family Medicine

## 2022-03-01 ENCOUNTER — Encounter: Payer: Self-pay | Admitting: Orthopedic Surgery

## 2022-03-01 ENCOUNTER — Other Ambulatory Visit: Payer: Self-pay | Admitting: Gastroenterology

## 2022-03-01 DIAGNOSIS — K219 Gastro-esophageal reflux disease without esophagitis: Secondary | ICD-10-CM

## 2022-03-01 DIAGNOSIS — E1169 Type 2 diabetes mellitus with other specified complication: Secondary | ICD-10-CM

## 2022-03-01 DIAGNOSIS — R809 Proteinuria, unspecified: Secondary | ICD-10-CM

## 2022-03-01 NOTE — H&P (Signed)
ORTHOPAEDIC HISTORY & PHYSICAL ?Tamala Julian [place], [place] - 02/24/2022 1:45 PM EDT ?Formatting of this note is different from the original. ?[facility] ?ORTHOPAEDICS [facility]TS [facility]ICINE ?Chief Complaint:  ? ?Chief Complaint  ?Patient presents with  ? Knee Pain  ?H & P RIGHT KNEE  ? ?History of Present Illness:  ? ?Cassidy Norris is a 71 y.o. female that presents to clinic today for her preoperative history and evaluation. Patient presents unaccompanied. The patient is scheduled to undergo a right total knee arthroplasty on 03/06/22 by Dr. Marry Guan. Her pain began many years ago. The pain is located primarily along the lateral aspect of the knee. She describes her pain as worse with weightbearing and rising from a seated position. She reports associated swelling with some giving way of the knee. She denies associated numbness or tingling, denies locking.  ? ?The patient's symptoms have progressed to the point that they decrease her quality of life. The patient has previously undergone conservative treatment including NSAIDS and activity modifications without adequate control of her symptoms. ? ?Denies history of DVT. Is not currently followed by cardiology. Does report history of lumbar surgery but states there is no hardware. Patient does have history of breast cancer recurrence and is followed by Dr. Grayland Ormond. She has started chemotherapy. ? ?Last A1c 6.2 on 02/23/2022. ? ?Past Medical, Surgical, Family, Social History, Allergies, Medications:  ? ?Past Medical History:  ?Past Medical History:  ?Diagnosis Date  ? Anxiety  ? Breast cancer (CMS-HCC)  ? Essential hypertension, benign 09/24/2017  ?Last Assessment & Plan: Formatting of this note might be different from the original. Well-controlled HTN - Home BP readings normal Improved Creatinine NSAID meloxicam OFF Carvedilol from prior visit 03/2018, not indicated if normal HR and no cardiac etiology. Plan: 1. Continue current BP regimen - Lisinopril '5mg'$  daily  - refill 2. Encourage improved lifestyle - low sodium diet, regular exerci  ? Gastroesophageal reflux disease 01/20/2018  ?Last Assessment & Plan: Formatting of this note might be different from the original. Controlled On Famotidine, refill  ? History of cyst of breast  ?Left breast.  ? Hyperlipidemia associated with type 2 diabetes mellitus (CMS-HCC) 07/13/2017  ?Last Assessment & Plan: Formatting of this note might be different from the original. 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 May increase Fish Oil again, reconsider Statin vs Zetia in future 2. Continue ASA '81mg'$  for primary ASCVD risk reduction 3. Encourage improved lifestyle - low  ? Multiple sclerosis (CMS-HCC)  ? Pernicious anemia  ? Type 2 diabetes mellitus with other specified complication (CMS-HCC) 77/41/2878  ?Formatting of this note might be different from the original. Hgb A1C 6.6 09/02/16 Last Assessment & Plan: Formatting of this note might be different from the original. Elevated A1c up to 6.6, overall still at goal but was previously 5.8 range No hypoglycemia. 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 ?Past Surgical History:  ?Past Surgical History:  ?Procedure Laterality Date  ? PORTOCAVAL SHUNT PLACEMENT 02/26/2014  ? BACK SURGERY 09/14/2014  ? Left total knee arthroplasty 11/22/2015  ?Dr. Flossie Dibble  ? Anal polyps removed  ? CHOLECYSTECTOMY  ? Left breast cyst  ? MASTECTOMY Bilateral  ?Jan 09, 2014  ? Right breast cyst removed 1973  ? TUBAL LIGATION  ? ?Current Medications:  ?Current Outpatient Medications  ?Medication Sig Dispense Refill  ?  acetaminophen (TYLENOL) 500 MG tablet Take 1,000 mg by mouth once daily as needed  ? baclofen (LIORESAL) 10 MG tablet Take 20 mg by mouth 3 (three) times daily as needed  ? calcium carbonate  600 mg (1,500 mg) Tab tablet Take 1 tablet by mouth 2 (two) times daily.  ? cholecalciferol, vitamin D3, 10 mcg (400 unit) Cap Take 1 capsule by mouth Monday, Wednesday and Friday  ? cyanocobalamin, vitamin B-12, 5,000 mcg Subl Place 1 tablet under the tongue Monday, Wednesday and Friday  ? famotidine (PEPCID) 20 MG tablet Take 20 mg by mouth 2 (two) times daily before meals  ? fluticasone propionate (FLONASE) 50 mcg/actuation nasal spray Place 2 sprays into both nostrils at bedtime  ? fulvestrant (FASLODEX) 250 mg/5 mL injection Inject 250 mg into the muscle monthly  ? krill-om3-dha-epa-om6-lip-astx (KRILL OIL, OMEGA 3 AND 6,) 1000-130(40-80) mg Cap Take 1 capsule by mouth once daily  ? lisinopriL (ZESTRIL) 5 MG tablet Take 5 mg by mouth once daily  ? loperamide (IMODIUM) 2 mg capsule 4 mg 4 (four) times daily as needed  ? metFORMIN (GLUCOPHAGE) 500 MG tablet Take 500 mg by mouth 2 (two) times daily  ? omeprazole (PRILOSEC) 40 MG DR capsule Take 40 mg by mouth 2 (two) times daily  ? prochlorperazine (COMPAZINE) 10 MG tablet TAKE 1 TABLET BY MOUTH EVERY 6 HOURS AS NEEDED FOR NAUSEA OR VOMITING.  ? sodium chloride (AYR) 0.65 % nasal drops Place 2 sprays into both nostrils as needed  ? traZODone (DESYREL) 50 MG tablet Take 50 mg by mouth at bedtime  ? venlafaxine (EFFEXOR-XR) 75 MG XR capsule Take 1 capsule by mouth once daily  ? ?No current facility-administered medications for this visit.  ? ?Allergies: No Known Allergies ? ?Social History:  ?Social History  ? ?Socioeconomic History  ? Marital status: Widowed  ? Number of children: 0  ? Years of education: 12+  ? Highest education level: High school graduate  ?Occupational History  ? Occupation: Retired  ?Tobacco Use  ? Smoking status: Former  ?Packs/day: 1.00  ?Years: 30.00  ?Pack years: 30.00  ?Types: Cigarettes  ?Quit date: 11/23/2013  ?Years since quitting: 8.2  ? Smokeless tobacco: Never  ?Vaping Use  ? Vaping Use: Never used  ?Substance and Sexual Activity  ?  Alcohol use: Not Currently  ?Comment: Rarely  ? Drug use: No  ? Sexual activity: Defer  ?Partners: Male  ? ?Family History:  ?Family History  ?Problem Relation Age of Onset  ? Lung cancer Mother  ? Colon cancer Father  ? Stomach cancer Father  ? Rectal cancer Paternal Uncle  ? Rectal cancer Paternal Uncle  ? ?Review of Systems:  ? ?A 10+ ROS was performed, reviewed, and the pertinent orthopaedic findings are documented in the HPI.  ? ?Physical Examination:  ? ?BP (!) 140/80 (BP Location: Left upper arm, Patient Position: Sitting, BP Cuff Size: Large Adult)  Ht 162.6 cm ('5\' 4"'$ )  Wt 87.7 kg (193 lb 6.4 oz)  BMI 33.20 kg/m?  ? ?Patient is a well-developed, well-nourished female in no acute distress. Patient has normal mood and affect. Patient is alert and oriented to person, place, and time.  ? ?HEENT: Atraumatic, normocephalic. Pupils equal and reactive to light. Extraocular motion intact. Noninjected sclera. ? ?Cardiovascular: Tachycardic rate and rhythm, with no murmurs, rubs, or gallops. Distal pulses auscultated with Doppler. ? ?Respiratory: Lungs clear to auscultation bilaterally.  ? ?Right Knee: ?Soft tissue swelling: minimal ?Effusion: none ?Erythema: none ?Crepitance:  mild ?Tenderness: lateral ?Alignment: relative valgus ?Mediolateral laxity: lateral pseudolaxity ?Posterior sag: negative ?Patellar tracking: Good tracking without evidence of subluxation or tilt ?Atrophy: No significant atrophy.  ?Quadriceps tone was fair to good. ?Range of motion: 0/10/115 degrees ? ?Able to actively plantarflex and dorsiflex the right ankle. Able to flex and extend the toes. ? ?Sensation intact over the saphenous, lateral sural cutaneous, superficial fibular, decreased over the deep fibular nerve distributions. ? ?Tests Performed/Reviewed:  ?X-rays ? ?Anteroposterior, lateral, and sunrise views of the right knee were reviewed. Images reveal severe loss of lateral compartment joint space with significant osteophyte  formation. No fractures or osseous abnormalities noted. ? ?Impression:  ? ?ICD-10-CM  ?1. Primary osteoarthritis of right knee M17.11  ? ?Plan:  ? ?The patient has end-stage degenerative changes of the right knee. It

## 2022-03-02 ENCOUNTER — Encounter: Payer: Self-pay | Admitting: Family Medicine

## 2022-03-02 DIAGNOSIS — G4700 Insomnia, unspecified: Secondary | ICD-10-CM

## 2022-03-02 NOTE — Telephone Encounter (Signed)
Requested medication (s) are due for refill today:yes ? ?Requested medication (s) are on the active medication list: yes ? ?Last refill:  12/07/21 ? ?Future visit scheduled: yes ? ?Notes to clinic:  Unable to refill per protocol, last refill by another provider.  ? ? ?  ?Requested Prescriptions  ?Pending Prescriptions Disp Refills  ? famotidine (PEPCID) 20 MG tablet [Pharmacy Med Name: FAMOTIDINE 20 MG TABLET] 180 tablet   ?  Sig: TAKE 1 TABLET BY MOUTH TWICE A DAY BEFORE MEALS  ?  ? Gastroenterology:  H2 Antagonists Passed - 03/01/2022 10:09 AM  ?  ?  Passed - Valid encounter within last 12 months  ?  Recent Outpatient Visits   ? ?      ? 4 months ago Annual physical exam  ? [place], DO  ? 10 months ago Chest wall pain  ? [place], DO  ? 1 year ago Type 2 diabetes mellitus with other specified complication, without long-term current use of insulin ([place])  ? [place], DO  ? 1 year ago Type 2 diabetes mellitus with other specified complication, without long-term current use of insulin ([place])  ? [place], DO  ? 2 years ago Primary insomnia  ? [place], DO  ? ?  ?  ?Future Appointments   ? ?        ? In 1 month Karamalegos, [name], DO [facility], [place]  ? In 5 months [name], MD [place]  ? ?  ? ?  ?  ?  ?Signed Prescriptions Disp Refills  ? lisinopril (ZESTRIL) 5 MG tablet 90 tablet 3  ?  Sig: TAKE 1 TABLET BY MOUTH EVERY DAY IN THE EVENING  ?  ? Cardiovascular:  ACE Inhibitors Failed - 03/01/2022 10:09 AM  ?  ?  Failed - Cr in normal range and within 180 days  ?  Creat  ?Date Value Ref Range Status  ?10/20/2021 0.85 0.50 - 1.05 mg/dL Final  ? ?Creatinine, Ser  ?Date Value Ref Range Status  ?02/23/2022 1.40 (H) 0.44 - 1.00 mg/dL Final  ?  ?  ?  ?  Failed - Last BP in  normal range  ?  BP Readings from Last 1 Encounters:  ?02/23/22 (!) 143/72  ?  ?  ?  ?  Passed - K in normal range and within 180 days  ?  Potassium  ?Date Value Ref Range Status  ?02/23/2022 3.9 3.5 - 5.1 mmol/L Final  ?03/11/2015 3.4 (L) mmol/L Final  ?  Comment:  ?  3.5-5.1 ?NOTE: New Reference Range ? 01/29/15 ?  ?  ?  ?  ?  Passed - Patient is not pregnant  ?  ?  Passed - Valid encounter within last 6 months  ?  Recent Outpatient Visits   ? ?      ? 4 months ago Annual physical exam  ? [place], DO  ? 10 months ago Chest wall pain  ? [place], DO  ? 1 year ago Type 2 diabetes mellitus with other specified complication, without long-term current use of insulin ([place])  ? [place], DO  ? 1 year ago Type 2 diabetes mellitus with other specified complication, without long-term current  use of insulin ([place])  ? [place], DO  ? 2 years ago Primary insomnia  ? [place], DO  ? ?  ?  ?Future Appointments   ? ?        ? In 1 month Karamalegos, [name], DO [facility], [place]  ? In 5 months [name], MD [place]  ? ?  ? ?  ?  ?  ? ? ?

## 2022-03-02 NOTE — Telephone Encounter (Signed)
Requested Prescriptions  ?Pending Prescriptions Disp Refills  ?? famotidine (PEPCID) 20 MG tablet [Pharmacy Med Name: FAMOTIDINE 20 MG TABLET] 180 tablet   ?  Sig: TAKE 1 TABLET BY MOUTH TWICE A DAY BEFORE MEALS  ?  ? Gastroenterology:  H2 Antagonists Passed - 03/01/2022 10:09 AM  ?  ?  Passed - Valid encounter within last 12 months  ?  Recent Outpatient Visits   ?      ? 4 months ago Annual physical exam  ? [place], DO  ? 10 months ago Chest wall pain  ? [place], DO  ? 1 year ago Type 2 diabetes mellitus with other specified complication, without long-term current use of insulin ([place])  ? [place], DO  ? 1 year ago Type 2 diabetes mellitus with other specified complication, without long-term current use of insulin ([place])  ? [place], DO  ? 2 years ago Primary insomnia  ? [place], DO  ?  ?  ?Future Appointments   ?        ? In 1 month Karamalegos, [name], DO [facility], [place]  ? In 5 months [name], MD [place]  ?  ? ?  ?  ?  ?? lisinopril (ZESTRIL) 5 MG tablet [Pharmacy Med Name: LISINOPRIL 5 MG TABLET] 90 tablet 3  ?  Sig: TAKE 1 TABLET BY MOUTH EVERY DAY IN THE EVENING  ?  ? Cardiovascular:  ACE Inhibitors Failed - 03/01/2022 10:09 AM  ?  ?  Failed - Cr in normal range and within 180 days  ?  Creat  ?Date Value Ref Range Status  ?10/20/2021 0.85 0.50 - 1.05 mg/dL Final  ? ?Creatinine, Ser  ?Date Value Ref Range Status  ?02/23/2022 1.40 (H) 0.44 - 1.00 mg/dL Final  ?   ?  ?  Failed - Last BP in normal range  ?  BP Readings from Last 1 Encounters:  ?02/23/22 (!) 143/72  ?   ?  ?  Passed - K in normal range and within 180 days  ?  Potassium  ?Date Value Ref Range Status  ?02/23/2022 3.9 3.5 - 5.1 mmol/L Final  ?03/11/2015 3.4 (L) mmol/L Final  ?  Comment:  ?   3.5-5.1 ?NOTE: New Reference Range ? 01/29/15 ?  ?   ?  ?  Passed - Patient is not pregnant  ?  ?  Passed - Valid encounter within last 6 months  ?  Recent Outpatient Visits   ?      ? 4 months ago Annual physical exam  ? [place], DO  ? 10 months ago Chest wall pain  ? [place], DO  ? 1 year ago Type 2 diabetes mellitus with other specified complication, without long-term current use of insulin ([place])  ? [place], DO  ? 1 year ago Type 2 diabetes mellitus with other specified complication, without long-term current use of insulin ([place])  ? [place], DO  ? 2 years ago Primary insomnia  ? [place], DO  ?  ?  ?Future Appointments   ?        ? In 1 month [name], [name],  DO [place]  ? In 5 months [name], MD [place]  ?  ? ?  ?  ?  ? ? ?

## 2022-03-03 MED ORDER — TRAZODONE HCL 50 MG PO TABS: 50.0000 mg | ORAL_TABLET | Freq: Every evening | ORAL | 3 refills | Status: DC | PRN

## 2022-03-05 MED ORDER — CHLORHEXIDINE GLUCONATE 4 % EX LIQD: 60.0000 mL | Freq: Once | CUTANEOUS | Status: DC

## 2022-03-05 MED ORDER — ORAL CARE MOUTH RINSE: 15.0000 mL | Freq: Once | OROMUCOSAL | Status: AC

## 2022-03-05 MED ORDER — CHLORHEXIDINE GLUCONATE 0.12 % MT SOLN: 15.0000 mL | Freq: Once | OROMUCOSAL | Status: AC

## 2022-03-05 MED ORDER — TRANEXAMIC ACID-NACL 1000-0.7 MG/100ML-% IV SOLN
1000.0000 mg | INTRAVENOUS | Status: AC
  Administered 2022-03-06: 1000 mg via INTRAVENOUS

## 2022-03-05 MED ORDER — SODIUM CHLORIDE 0.9 % IV SOLN
INTRAVENOUS | Status: DC
Start: 2022-03-05 — End: 2022-03-06

## 2022-03-05 MED ORDER — CEFAZOLIN SODIUM-DEXTROSE 2-4 GM/100ML-% IV SOLN
2.0000 g | INTRAVENOUS | Status: AC
  Administered 2022-03-06: 2 g via INTRAVENOUS

## 2022-03-05 MED ORDER — GABAPENTIN 300 MG PO CAPS: 300.0000 mg | ORAL_CAPSULE | Freq: Once | ORAL | Status: AC

## 2022-03-05 MED ORDER — DEXAMETHASONE SODIUM PHOSPHATE 10 MG/ML IJ SOLN: 8.0000 mg | Freq: Once | INTRAMUSCULAR | Status: AC

## 2022-03-05 MED ORDER — CELECOXIB 200 MG PO CAPS: 400.0000 mg | ORAL_CAPSULE | Freq: Once | ORAL | Status: AC

## 2022-03-06 ENCOUNTER — Other Ambulatory Visit: Payer: Self-pay

## 2022-03-06 ENCOUNTER — Encounter: Payer: Self-pay | Admitting: Orthopedic Surgery

## 2022-03-06 ENCOUNTER — Ambulatory Visit: Payer: Medicare Other | Admitting: Urgent Care

## 2022-03-06 ENCOUNTER — Observation Stay: Payer: Medicare Other

## 2022-03-06 ENCOUNTER — Encounter
Admission: RE | Disposition: A | Discharge status: Home Health | Payer: Self-pay | Source: Home / Self Care | Attending: Orthopedic Surgery

## 2022-03-06 ENCOUNTER — Observation Stay
Admission: RE | Admit: 2022-03-06 | Discharge: 2022-03-07 | Disposition: A | Discharge status: Home Health | Payer: Medicare Other | Attending: Orthopedic Surgery | Admitting: Orthopedic Surgery

## 2022-03-06 ENCOUNTER — Ambulatory Visit: Payer: Medicare Other | Admitting: Anesthesiology

## 2022-03-06 DIAGNOSIS — E1169 Type 2 diabetes mellitus with other specified complication: Secondary | ICD-10-CM

## 2022-03-06 DIAGNOSIS — Z853 Personal history of malignant neoplasm of breast: Secondary | ICD-10-CM | POA: Insufficient documentation

## 2022-03-06 DIAGNOSIS — E119 Type 2 diabetes mellitus without complications: Secondary | ICD-10-CM | POA: Diagnosis not present

## 2022-03-06 DIAGNOSIS — Z79899 Other long term (current) drug therapy: Secondary | ICD-10-CM | POA: Diagnosis not present

## 2022-03-06 DIAGNOSIS — Z7984 Long term (current) use of oral hypoglycemic drugs: Secondary | ICD-10-CM | POA: Insufficient documentation

## 2022-03-06 DIAGNOSIS — Z96652 Presence of left artificial knee joint: Secondary | ICD-10-CM | POA: Diagnosis not present

## 2022-03-06 DIAGNOSIS — M1711 Unilateral primary osteoarthritis, right knee: Principal | ICD-10-CM

## 2022-03-06 DIAGNOSIS — Z87891 Personal history of nicotine dependence: Secondary | ICD-10-CM | POA: Diagnosis not present

## 2022-03-06 DIAGNOSIS — I10 Essential (primary) hypertension: Secondary | ICD-10-CM | POA: Diagnosis not present

## 2022-03-06 DIAGNOSIS — Z96659 Presence of unspecified artificial knee joint: Secondary | ICD-10-CM

## 2022-03-06 DIAGNOSIS — T797XXA Traumatic subcutaneous emphysema, initial encounter: Secondary | ICD-10-CM | POA: Diagnosis not present

## 2022-03-06 DIAGNOSIS — Z471 Aftercare following joint replacement surgery: Secondary | ICD-10-CM | POA: Diagnosis not present

## 2022-03-06 DIAGNOSIS — T8859XA Other complications of anesthesia, initial encounter: Secondary | ICD-10-CM

## 2022-03-06 DIAGNOSIS — R7989 Other specified abnormal findings of blood chemistry: Secondary | ICD-10-CM

## 2022-03-06 DIAGNOSIS — K219 Gastro-esophageal reflux disease without esophagitis: Secondary | ICD-10-CM | POA: Diagnosis not present

## 2022-03-06 DIAGNOSIS — Z96651 Presence of right artificial knee joint: Secondary | ICD-10-CM | POA: Diagnosis not present

## 2022-03-06 DIAGNOSIS — C50919 Malignant neoplasm of unspecified site of unspecified female breast: Secondary | ICD-10-CM

## 2022-03-06 HISTORY — DX: Other complications of anesthesia, initial encounter: T88.59XA

## 2022-03-06 HISTORY — PX: KNEE ARTHROPLASTY: SHX992

## 2022-03-06 LAB — GLUCOSE, CAPILLARY
Glucose-Capillary: 117 mg/dL — ABNORMAL HIGH (ref 70–99)
Glucose-Capillary: 183 mg/dL — ABNORMAL HIGH (ref 70–99)
Glucose-Capillary: 199 mg/dL — ABNORMAL HIGH (ref 70–99)

## 2022-03-06 SURGERY — ARTHROPLASTY, KNEE, TOTAL, USING IMAGELESS COMPUTER-ASSISTED NAVIGATION
Anesthesia: General | Site: Knee | Laterality: Right

## 2022-03-06 MED ORDER — TRANEXAMIC ACID-NACL 1000-0.7 MG/100ML-% IV SOLN
INTRAVENOUS | Status: AC
  Filled 2022-03-06: qty 100

## 2022-03-06 MED ORDER — BISACODYL 10 MG RE SUPP: 10.0000 mg | Freq: Every day | RECTAL | Status: DC | PRN

## 2022-03-06 MED ORDER — INSULIN ASPART 100 UNIT/ML IJ SOLN: 0.0000 [IU] | Freq: Every day | INTRAMUSCULAR | Status: DC

## 2022-03-06 MED ORDER — TRANEXAMIC ACID-NACL 1000-0.7 MG/100ML-% IV SOLN
1000.0000 mg | Freq: Once | INTRAVENOUS | Status: AC
  Administered 2022-03-06: 1000 mg via INTRAVENOUS

## 2022-03-06 MED ORDER — ONDANSETRON HCL 4 MG PO TABS: 4.0000 mg | ORAL_TABLET | Freq: Four times a day (QID) | ORAL | Status: DC | PRN

## 2022-03-06 MED ORDER — TRAMADOL HCL 50 MG PO TABS
50.0000 mg | ORAL_TABLET | ORAL | Status: DC | PRN
  Administered 2022-03-07: 50 mg via ORAL
  Filled 2022-03-06: qty 1

## 2022-03-06 MED ORDER — ALUM & MAG HYDROXIDE-SIMETH 200-200-20 MG/5ML PO SUSP: 30.0000 mL | ORAL | Status: DC | PRN

## 2022-03-06 MED ORDER — MIDAZOLAM HCL 2 MG/2ML IJ SOLN
INTRAMUSCULAR | Status: AC
  Filled 2022-03-06: qty 2

## 2022-03-06 MED ORDER — OXYCODONE HCL 5 MG PO TABS: 10.0000 mg | ORAL_TABLET | ORAL | Status: DC | PRN

## 2022-03-06 MED ORDER — OXYCODONE HCL 5 MG PO TABS: 5.0000 mg | ORAL_TABLET | Freq: Once | ORAL | Status: DC | PRN

## 2022-03-06 MED ORDER — LIDOCAINE HCL (CARDIAC) PF 100 MG/5ML IV SOSY
PREFILLED_SYRINGE | INTRAVENOUS | Status: DC | PRN
Start: 2022-03-06 — End: 2022-03-06
  Administered 2022-03-06: 80 mg via INTRAVENOUS

## 2022-03-06 MED ORDER — VENLAFAXINE HCL ER 75 MG PO CP24
75.0000 mg | ORAL_CAPSULE | Freq: Every day | ORAL | Status: DC
  Administered 2022-03-07: 75 mg via ORAL
  Filled 2022-03-06: qty 1

## 2022-03-06 MED ORDER — PROMETHAZINE HCL 25 MG/ML IJ SOLN: 6.2500 mg | INTRAMUSCULAR | Status: DC | PRN

## 2022-03-06 MED ORDER — CELECOXIB 200 MG PO CAPS
200.0000 mg | ORAL_CAPSULE | Freq: Two times a day (BID) | ORAL | Status: DC
  Administered 2022-03-06 – 2022-03-07 (×2): 200 mg via ORAL
  Filled 2022-03-06 (×2): qty 1

## 2022-03-06 MED ORDER — ACETAMINOPHEN 325 MG PO TABS: 325.0000 mg | ORAL_TABLET | Freq: Four times a day (QID) | ORAL | Status: DC | PRN

## 2022-03-06 MED ORDER — PROPOFOL 10 MG/ML IV BOLUS
INTRAVENOUS | Status: DC | PRN
  Administered 2022-03-06: 20 mg via INTRAVENOUS
  Administered 2022-03-06: 150 mg via INTRAVENOUS
  Administered 2022-03-06: 50 mg via INTRAVENOUS

## 2022-03-06 MED ORDER — MIDAZOLAM HCL 2 MG/2ML IJ SOLN
INTRAMUSCULAR | Status: DC | PRN
  Administered 2022-03-06 (×2): 1 mg via INTRAVENOUS

## 2022-03-06 MED ORDER — FLEET ENEMA 7-19 GM/118ML RE ENEM: 1.0000 | ENEMA | Freq: Once | RECTAL | Status: DC | PRN

## 2022-03-06 MED ORDER — SALINE SPRAY 0.65 % NA SOLN: 1.0000 | NASAL | Status: DC | PRN

## 2022-03-06 MED ORDER — ONDANSETRON HCL 4 MG/2ML IJ SOLN
INTRAMUSCULAR | Status: DC | PRN
  Administered 2022-03-06: 5 mg via INTRAVENOUS

## 2022-03-06 MED ORDER — FENTANYL CITRATE (PF) 100 MCG/2ML IJ SOLN
INTRAMUSCULAR | Status: AC
  Filled 2022-03-06: qty 2

## 2022-03-06 MED ORDER — TRAZODONE HCL 50 MG PO TABS
50.0000 mg | ORAL_TABLET | Freq: Every evening | ORAL | Status: DC | PRN
  Administered 2022-03-06: 50 mg via ORAL
  Filled 2022-03-06: qty 1

## 2022-03-06 MED ORDER — METOPROLOL TARTRATE 5 MG/5ML IV SOLN
INTRAVENOUS | Status: DC | PRN
  Administered 2022-03-06: 2 mg via INTRAVENOUS

## 2022-03-06 MED ORDER — ACETAMINOPHEN 10 MG/ML IV SOLN: 1000.0000 mg | Freq: Once | INTRAVENOUS | Status: DC | PRN

## 2022-03-06 MED ORDER — ROCURONIUM BROMIDE 10 MG/ML (PF) SYRINGE
PREFILLED_SYRINGE | INTRAVENOUS | Status: AC
  Filled 2022-03-06: qty 10

## 2022-03-06 MED ORDER — LIDOCAINE HCL (PF) 2 % IJ SOLN
INTRAMUSCULAR | Status: AC
  Filled 2022-03-06: qty 5

## 2022-03-06 MED ORDER — ENOXAPARIN SODIUM 30 MG/0.3ML IJ SOSY
30.0000 mg | PREFILLED_SYRINGE | Freq: Two times a day (BID) | INTRAMUSCULAR | Status: DC
  Administered 2022-03-07: 30 mg via SUBCUTANEOUS
  Filled 2022-03-06: qty 0.3

## 2022-03-06 MED ORDER — ACETAMINOPHEN 10 MG/ML IV SOLN
INTRAVENOUS | Status: AC
  Filled 2022-03-06: qty 100

## 2022-03-06 MED ORDER — SODIUM CHLORIDE 0.9 % IV SOLN
INTRAVENOUS | Status: DC | PRN
Start: 2022-03-06 — End: 2022-03-06

## 2022-03-06 MED ORDER — PANTOPRAZOLE SODIUM 40 MG PO TBEC
40.0000 mg | DELAYED_RELEASE_TABLET | Freq: Two times a day (BID) | ORAL | Status: DC
  Administered 2022-03-06 – 2022-03-07 (×2): 40 mg via ORAL
  Filled 2022-03-06 (×2): qty 1

## 2022-03-06 MED ORDER — CELECOXIB 200 MG PO CAPS
ORAL_CAPSULE | ORAL | Status: AC
  Administered 2022-03-06: 400 mg via ORAL
  Filled 2022-03-06: qty 2

## 2022-03-06 MED ORDER — MENTHOL 3 MG MT LOZG: 1.0000 | LOZENGE | OROMUCOSAL | Status: DC | PRN

## 2022-03-06 MED ORDER — POLYETHYLENE GLYCOL 3350 17 G PO PACK: 17.0000 g | PACK | Freq: Two times a day (BID) | ORAL | Status: DC | PRN

## 2022-03-06 MED ORDER — METFORMIN HCL 500 MG PO TABS
500.0000 mg | ORAL_TABLET | Freq: Two times a day (BID) | ORAL | Status: DC
Start: 2022-03-07 — End: 2022-03-07
  Administered 2022-03-07: 500 mg via ORAL
  Filled 2022-03-06: qty 1

## 2022-03-06 MED ORDER — OXYCODONE HCL 5 MG/5ML PO SOLN: 5.0000 mg | Freq: Once | ORAL | Status: DC | PRN

## 2022-03-06 MED ORDER — DEXAMETHASONE SODIUM PHOSPHATE 10 MG/ML IJ SOLN
INTRAMUSCULAR | Status: AC
  Administered 2022-03-06: 8 mg via INTRAVENOUS
  Filled 2022-03-06: qty 1

## 2022-03-06 MED ORDER — LISINOPRIL 5 MG PO TABS
5.0000 mg | ORAL_TABLET | Freq: Every evening | ORAL | Status: DC
  Administered 2022-03-06: 5 mg via ORAL
  Filled 2022-03-06: qty 1

## 2022-03-06 MED ORDER — DIPHENHYDRAMINE HCL 12.5 MG/5ML PO ELIX: 12.5000 mg | ORAL_SOLUTION | ORAL | Status: DC | PRN

## 2022-03-06 MED ORDER — REMIFENTANIL HCL 1 MG IV SOLR
INTRAVENOUS | Status: DC | PRN
  Administered 2022-03-06: .05 ug/kg/min via INTRAVENOUS

## 2022-03-06 MED ORDER — FERROUS SULFATE 325 (65 FE) MG PO TABS
325.0000 mg | ORAL_TABLET | Freq: Two times a day (BID) | ORAL | Status: DC
  Administered 2022-03-07: 325 mg via ORAL
  Filled 2022-03-06: qty 1

## 2022-03-06 MED ORDER — PROPOFOL 10 MG/ML IV BOLUS
INTRAVENOUS | Status: AC
  Filled 2022-03-06: qty 20

## 2022-03-06 MED ORDER — OXYCODONE HCL 5 MG PO TABS: 5.0000 mg | ORAL_TABLET | ORAL | Status: DC | PRN

## 2022-03-06 MED ORDER — 0.9 % SODIUM CHLORIDE (POUR BTL) OPTIME
TOPICAL | Status: DC | PRN
  Administered 2022-03-06: 500 mL

## 2022-03-06 MED ORDER — KRILL OIL 1000 MG PO CAPS: 1000.0000 mg | ORAL_CAPSULE | Freq: Every day | ORAL | Status: DC

## 2022-03-06 MED ORDER — FENTANYL CITRATE (PF) 100 MCG/2ML IJ SOLN
INTRAMUSCULAR | Status: DC | PRN
  Administered 2022-03-06: 50 ug via INTRAVENOUS
  Administered 2022-03-06 (×3): 25 ug via INTRAVENOUS

## 2022-03-06 MED ORDER — REMIFENTANIL HCL 1 MG IV SOLR
INTRAVENOUS | Status: AC
  Filled 2022-03-06: qty 1000

## 2022-03-06 MED ORDER — ONDANSETRON HCL 4 MG/2ML IJ SOLN: 4.0000 mg | Freq: Four times a day (QID) | INTRAMUSCULAR | Status: DC | PRN

## 2022-03-06 MED ORDER — VITAMIN B-12 1000 MCG PO TABS
2500.0000 ug | ORAL_TABLET | ORAL | Status: DC
  Administered 2022-03-06: 2500 ug via ORAL
  Filled 2022-03-06 (×2): qty 3

## 2022-03-06 MED ORDER — BACLOFEN 10 MG PO TABS: 10.0000 mg | ORAL_TABLET | Freq: Three times a day (TID) | ORAL | Status: DC | PRN

## 2022-03-06 MED ORDER — ONDANSETRON HCL 4 MG/2ML IJ SOLN
INTRAMUSCULAR | Status: AC
  Filled 2022-03-06: qty 2

## 2022-03-06 MED ORDER — FENTANYL CITRATE (PF) 100 MCG/2ML IJ SOLN
25.0000 ug | INTRAMUSCULAR | Status: DC | PRN
  Administered 2022-03-06 (×2): 50 ug via INTRAVENOUS

## 2022-03-06 MED ORDER — CHLORHEXIDINE GLUCONATE 0.12 % MT SOLN
OROMUCOSAL | Status: AC
  Administered 2022-03-06: 15 mL via OROMUCOSAL
  Filled 2022-03-06: qty 15

## 2022-03-06 MED ORDER — METOCLOPRAMIDE HCL 10 MG PO TABS
10.0000 mg | ORAL_TABLET | Freq: Three times a day (TID) | ORAL | Status: DC
  Administered 2022-03-06 – 2022-03-07 (×2): 10 mg via ORAL
  Filled 2022-03-06 (×2): qty 1

## 2022-03-06 MED ORDER — CEFAZOLIN SODIUM-DEXTROSE 2-4 GM/100ML-% IV SOLN
INTRAVENOUS | Status: AC
  Filled 2022-03-06: qty 100

## 2022-03-06 MED ORDER — VITAMIN D 25 MCG (1000 UNIT) PO TABS
2000.0000 [IU] | ORAL_TABLET | ORAL | Status: DC
  Administered 2022-03-06: 2000 [IU] via ORAL
  Filled 2022-03-06 (×2): qty 2

## 2022-03-06 MED ORDER — MAGNESIUM HYDROXIDE 400 MG/5ML PO SUSP
30.0000 mL | Freq: Every day | ORAL | Status: DC
  Administered 2022-03-07: 30 mL via ORAL
  Filled 2022-03-06: qty 30

## 2022-03-06 MED ORDER — ACETAMINOPHEN 10 MG/ML IV SOLN
INTRAVENOUS | Status: DC | PRN
  Administered 2022-03-06: 1000 mg via INTRAVENOUS

## 2022-03-06 MED ORDER — PHENOL 1.4 % MT LIQD: 1.0000 | OROMUCOSAL | Status: DC | PRN

## 2022-03-06 MED ORDER — ACETAMINOPHEN 10 MG/ML IV SOLN
1000.0000 mg | Freq: Four times a day (QID) | INTRAVENOUS | Status: DC
  Administered 2022-03-06 – 2022-03-07 (×3): 1000 mg via INTRAVENOUS
  Filled 2022-03-06 (×3): qty 100

## 2022-03-06 MED ORDER — SODIUM CHLORIDE (PF) 0.9 % IJ SOLN
INTRAMUSCULAR | Status: DC | PRN
Start: 2022-03-06 — End: 2022-03-06
  Administered 2022-03-06: 120 mL via INTRAMUSCULAR

## 2022-03-06 MED ORDER — DROPERIDOL 2.5 MG/ML IJ SOLN: 0.6250 mg | Freq: Once | INTRAMUSCULAR | Status: DC | PRN

## 2022-03-06 MED ORDER — INSULIN ASPART 100 UNIT/ML IJ SOLN
0.0000 [IU] | Freq: Three times a day (TID) | INTRAMUSCULAR | Status: DC
  Administered 2022-03-07: 2 [IU] via SUBCUTANEOUS
  Filled 2022-03-06: qty 1

## 2022-03-06 MED ORDER — GABAPENTIN 300 MG PO CAPS
ORAL_CAPSULE | ORAL | Status: AC
  Administered 2022-03-06: 300 mg via ORAL
  Filled 2022-03-06: qty 1

## 2022-03-06 MED ORDER — EPHEDRINE SULFATE (PRESSORS) 50 MG/ML IJ SOLN
INTRAMUSCULAR | Status: DC | PRN
  Administered 2022-03-06 (×3): 5 mg via INTRAVENOUS

## 2022-03-06 MED ORDER — SODIUM CHLORIDE 0.9 % IV SOLN: INTRAVENOUS | Status: DC

## 2022-03-06 MED ORDER — SODIUM CHLORIDE 0.9 % IR SOLN
Status: DC | PRN
  Administered 2022-03-06: 3000 mL

## 2022-03-06 MED ORDER — PHENYLEPHRINE HCL (PRESSORS) 10 MG/ML IV SOLN
INTRAVENOUS | Status: DC | PRN
  Administered 2022-03-06: 120 ug via INTRAVENOUS

## 2022-03-06 MED ORDER — DEXMEDETOMIDINE HCL IN NACL 80 MCG/20ML IV SOLN
INTRAVENOUS | Status: AC
  Filled 2022-03-06: qty 20

## 2022-03-06 MED ORDER — FLUTICASONE PROPIONATE 50 MCG/ACT NA SUSP
2.0000 | Freq: Every day | NASAL | Status: DC
  Administered 2022-03-06: 2 via NASAL
  Filled 2022-03-06: qty 16

## 2022-03-06 MED ORDER — CEFAZOLIN SODIUM-DEXTROSE 2-4 GM/100ML-% IV SOLN
2.0000 g | Freq: Four times a day (QID) | INTRAVENOUS | Status: AC
  Administered 2022-03-06 – 2022-03-07 (×2): 2 g via INTRAVENOUS
  Filled 2022-03-06 (×2): qty 100

## 2022-03-06 MED ORDER — METOPROLOL TARTRATE 5 MG/5ML IV SOLN
INTRAVENOUS | Status: AC
  Filled 2022-03-06: qty 5

## 2022-03-06 MED ORDER — SURGIPHOR WOUND IRRIGATION SYSTEM - OPTIME
TOPICAL | Status: DC | PRN
  Administered 2022-03-06: 1

## 2022-03-06 MED ORDER — SENNOSIDES-DOCUSATE SODIUM 8.6-50 MG PO TABS
1.0000 | ORAL_TABLET | Freq: Two times a day (BID) | ORAL | Status: DC
Start: 2022-03-06 — End: 2022-03-07
  Administered 2022-03-06 – 2022-03-07 (×2): 1 via ORAL
  Filled 2022-03-06 (×2): qty 1

## 2022-03-06 MED ORDER — DEXMEDETOMIDINE HCL IN NACL 200 MCG/50ML IV SOLN
INTRAVENOUS | Status: DC | PRN
  Administered 2022-03-06 (×2): 4 ug via INTRAVENOUS
  Administered 2022-03-06: 12 ug via INTRAVENOUS
  Administered 2022-03-06: 4 ug via INTRAVENOUS
  Administered 2022-03-06: 8 ug via INTRAVENOUS

## 2022-03-06 MED ORDER — LOPERAMIDE HCL 2 MG PO CAPS: 4.0000 mg | ORAL_CAPSULE | Freq: Four times a day (QID) | ORAL | Status: DC | PRN

## 2022-03-06 MED ORDER — ROCURONIUM BROMIDE 100 MG/10ML IV SOLN
INTRAVENOUS | Status: DC | PRN
Start: 2022-03-06 — End: 2022-03-06
  Administered 2022-03-06: 80 mg via INTRAVENOUS

## 2022-03-06 MED ORDER — HYDROMORPHONE HCL 1 MG/ML IJ SOLN: 0.5000 mg | INTRAMUSCULAR | Status: DC | PRN

## 2022-03-06 MED ORDER — SUGAMMADEX SODIUM 200 MG/2ML IV SOLN
INTRAVENOUS | Status: DC | PRN
  Administered 2022-03-06: 190 mg via INTRAVENOUS

## 2022-03-06 SURGICAL SUPPLY — 80 items
ATTUNE PS FEM RT SZ 4 CEM KNEE (Femur) ×1 IMPLANT
ATTUNE PSRP INSR SZ4 5 KNEE (Insert) ×1 IMPLANT
BASE TIBIAL ROT PLAT SZ 5 KNEE (Knees) IMPLANT
BATTERY INSTRU NAVIGATION (MISCELLANEOUS) ×8 IMPLANT
BLADE CLIPPER SURG (BLADE) ×1 IMPLANT
BLADE SAW 70X12.5 (BLADE) ×2 IMPLANT
BLADE SAW 90X13X1.19 OSCILLAT (BLADE) ×2 IMPLANT
BLADE SAW 90X25X1.19 OSCILLAT (BLADE) ×2 IMPLANT
BONE CEMENT GENTAMICIN (Cement) ×4 IMPLANT
CEMENT BONE GENTAMICIN 40 (Cement) IMPLANT
COOLER POLAR GLACIER W/PUMP (MISCELLANEOUS) ×2 IMPLANT
CUFF TOURN SGL QUICK 24 (TOURNIQUET CUFF)
CUFF TOURN SGL QUICK 34 (TOURNIQUET CUFF)
CUFF TRNQT CYL 24X4X16.5-23 (TOURNIQUET CUFF) IMPLANT
CUFF TRNQT CYL 34X4.125X (TOURNIQUET CUFF) IMPLANT
DRAPE 3/4 80X56 (DRAPES) ×2 IMPLANT
DRAPE INCISE IOBAN 66X45 STRL (DRAPES) ×2 IMPLANT
DRSG DERMACEA 8X12 NADH (GAUZE/BANDAGES/DRESSINGS) ×2 IMPLANT
DRSG MEPILEX SACRM 8.7X9.8 (GAUZE/BANDAGES/DRESSINGS) ×2 IMPLANT
DRSG OPSITE POSTOP 4X14 (GAUZE/BANDAGES/DRESSINGS) ×2 IMPLANT
DRSG TEGADERM 4X4.75 (GAUZE/BANDAGES/DRESSINGS) ×2 IMPLANT
DURAPREP 26ML APPLICATOR (WOUND CARE) ×4 IMPLANT
ELECT CAUTERY BLADE 6.4 (BLADE) ×2 IMPLANT
ELECT REM PT RETURN 9FT ADLT (ELECTROSURGICAL) ×2
ELECTRODE REM PT RTRN 9FT ADLT (ELECTROSURGICAL) ×1 IMPLANT
EX-PIN ORTHOLOCK NAV 4X150 (PIN) ×4 IMPLANT
GLOVE BIOGEL PI IND STRL 8 (GLOVE) ×1 IMPLANT
GLOVE BIOGEL PI INDICATOR 8 (GLOVE) ×1
GLOVE SURG ENC TEXT LTX SZ7.5 (GLOVE) ×8 IMPLANT
GLOVE SURG UNDER POLY LF SZ7.5 (GLOVE) ×2 IMPLANT
GOWN STRL REUS W/ TWL LRG LVL3 (GOWN DISPOSABLE) ×2 IMPLANT
GOWN STRL REUS W/ TWL XL LVL3 (GOWN DISPOSABLE) ×1 IMPLANT
GOWN STRL REUS W/TWL LRG LVL3 (GOWN DISPOSABLE) ×2
GOWN STRL REUS W/TWL XL LVL3 (GOWN DISPOSABLE) ×1
HEMOVAC 400CC 10FR (MISCELLANEOUS) ×2 IMPLANT
HOLDER FOLEY CATH W/STRAP (MISCELLANEOUS) ×1 IMPLANT
HOLSTER ELECTROSUGICAL PENCIL (MISCELLANEOUS) ×1 IMPLANT
HOOD PEEL AWAY FLYTE STAYCOOL (MISCELLANEOUS) ×4 IMPLANT
IV NS IRRIG 3000ML ARTHROMATIC (IV SOLUTION) ×2 IMPLANT
KIT TURNOVER KIT A (KITS) ×2 IMPLANT
KNIFE SCULPS 14X20 (INSTRUMENTS) ×2 IMPLANT
LABEL OR SOLS (LABEL) ×1 IMPLANT
MANIFOLD NEPTUNE II (INSTRUMENTS) ×4 IMPLANT
NDL SAFETY ECLIPSE 18X1.5 (NEEDLE) ×1 IMPLANT
NDL SPNL 20GX3.5 QUINCKE YW (NEEDLE) ×2 IMPLANT
NEEDLE HYPO 18GX1.5 SHARP (NEEDLE) ×1
NEEDLE SPNL 20GX3.5 QUINCKE YW (NEEDLE) ×4 IMPLANT
NS IRRIG 500ML POUR BTL (IV SOLUTION) ×2 IMPLANT
PACK TOTAL KNEE (MISCELLANEOUS) ×2 IMPLANT
PAD ABD DERMACEA PRESS 5X9 (GAUZE/BANDAGES/DRESSINGS) ×4 IMPLANT
PAD WRAPON POLAR KNEE (MISCELLANEOUS) ×1 IMPLANT
PATELLA MEDIAL ATTUN 35MM KNEE (Knees) ×1 IMPLANT
PENCIL SMOKE EVACUATOR COATED (MISCELLANEOUS) ×1 IMPLANT
PIN DRILL FIX HALF THREAD (BIT) ×4 IMPLANT
PIN FIXATION 1/8DIA X 3INL (PIN) ×2 IMPLANT
PULSAVAC PLUS IRRIG FAN TIP (DISPOSABLE) ×2
SOL PREP PVP 2OZ (MISCELLANEOUS) ×2
SOLUTION IRRIG SURGIPHOR (IV SOLUTION) ×2 IMPLANT
SOLUTION PREP PVP 2OZ (MISCELLANEOUS) ×1 IMPLANT
SOLUTION PRONTOSAN WOUND 350ML (IRRIGATION / IRRIGATOR) ×1 IMPLANT
SPONGE DRAIN TRACH 4X4 STRL 2S (GAUZE/BANDAGES/DRESSINGS) ×2 IMPLANT
SPONGE T-LAP 18X18 ~~LOC~~+RFID (SPONGE) ×1 IMPLANT
STAPLER SKIN PROX 35W (STAPLE) ×2 IMPLANT
STOCKINETTE IMPERV 14X48 (MISCELLANEOUS) IMPLANT
STRAP TIBIA SHORT (MISCELLANEOUS) ×2 IMPLANT
SUCTION FRAZIER HANDLE 10FR (MISCELLANEOUS) ×1
SUCTION TUBE FRAZIER 10FR DISP (MISCELLANEOUS) ×1 IMPLANT
SUT VIC AB 0 CT1 36 (SUTURE) ×4 IMPLANT
SUT VIC AB 1 CT1 36 (SUTURE) ×4 IMPLANT
SUT VIC AB 2-0 CT2 27 (SUTURE) ×2 IMPLANT
SYR 20ML LL LF (SYRINGE) ×1 IMPLANT
SYR 30ML LL (SYRINGE) ×4 IMPLANT
TIBIAL BASE ROT PLAT SZ 5 KNEE (Knees) ×2 IMPLANT
TIP FAN IRRIG PULSAVAC PLUS (DISPOSABLE) ×1 IMPLANT
TOWEL OR 17X26 4PK STRL BLUE (TOWEL DISPOSABLE) ×1 IMPLANT
TOWER CARTRIDGE SMART MIX (DISPOSABLE) ×2 IMPLANT
TRAY FOLEY MTR SLVR 16FR STAT (SET/KITS/TRAYS/PACK) ×2 IMPLANT
WATER STERILE IRR 1000ML POUR (IV SOLUTION) ×1 IMPLANT
WATER STERILE IRR 500ML POUR (IV SOLUTION) ×1 IMPLANT
WRAPON POLAR PAD KNEE (MISCELLANEOUS) ×2

## 2022-03-06 NOTE — Transfer of Care (Signed)
Immediate Anesthesia Transfer of Care Note ? ?Patient: Cassidy Norris ? ?Procedure(s) Performed: COMPUTER ASSISTED TOTAL KNEE ARTHROPLASTY (Right: Knee) ? ?Patient Location: PACU ? ?Anesthesia Type:General ? ?Level of Consciousness: drowsy ? ?Airway & Oxygen Therapy: Patient Spontanous Breathing and Patient connected to face mask oxygen ? ?Post-op Assessment: Report given to RN and Post -op Vital signs reviewed and stable ? ?Post vital signs: Reviewed and stable ? ?Last Vitals:  ?Vitals Value Taken Time  ?BP 120/80 03/06/22 1545  ?Temp    ?Pulse 109 03/06/22 1551  ?Resp 21 03/06/22 1551  ?SpO2 99 % 03/06/22 1551  ?Vitals shown include unvalidated device data. ? ?Last Pain:  ?Vitals:  ? 03/06/22 0950  ?TempSrc: Tympanic  ?   ? ?  ? ?Complications: No notable events documented. ?

## 2022-03-06 NOTE — Op Note (Signed)
OPERATIVE NOTE ? ?DATE OF SURGERY:  03/06/2022 ? ?PATIENT NAME:  Cassidy Norris   ?DOB: 07/23/1951  ?MRN: [id number] ? ?PRE-OPERATIVE DIAGNOSIS: Degenerative arthrosis of the right knee, primary ? ?POST-OPERATIVE DIAGNOSIS:  Same ? ?PROCEDURE:  Right total knee arthroplasty using computer-assisted navigation ? ?SURGEON:  Marciano Sequin. M.D. ? ?ANESTHESIA: general ? ?ESTIMATED BLOOD LOSS: 50 mL ? ?FLUIDS REPLACED: 900 mL of crystalloid ? ?TOURNIQUET TIME: 108 minutes ? ?DRAINS: 2 medium Hemovac drains ? ?SOFT TISSUE RELEASES: Anterior cruciate ligament, posterior cruciate ligament, deep medial collateral ligament, patellofemoral ligament, and posterolateral corner ? ?IMPLANTS UTILIZED: DePuy Attune size 4 posterior stabilized femoral component (cemented), size 5 rotating platform tibial component (cemented), 35 mm medialized dome patella (cemented), and a 5 mm stabilized rotating platform polyethylene insert. ? ?INDICATIONS FOR SURGERY: Cassidy Norris is a 71 y.o. year old female with a long history of progressive knee pain. X-rays demonstrated severe degenerative changes in tricompartmental fashion. The patient had not seen any significant improvement despite conservative nonsurgical intervention. After discussion of the risks and benefits of surgical intervention, the patient expressed understanding of the risks benefits and agree with plans for total knee arthroplasty.  ? ?The risks, benefits, and alternatives were discussed at length including but not limited to the risks of infection, bleeding, nerve injury, stiffness, blood clots, the need for revision surgery, cardiopulmonary complications, among others, and they were willing to proceed. ? ?PROCEDURE IN DETAIL: The patient was brought into the operating room and, after adequate general anesthesia was achieved, a tourniquet was placed on the patient's upper thigh. The patient's knee and leg were cleaned and prepped with alcohol and DuraPrep and draped in  the usual sterile fashion. A "timeout" was performed as per usual protocol. The lower extremity was exsanguinated using an Esmarch, and the tourniquet was inflated to 300 mmHg. An anterior longitudinal incision was made followed by a standard mid vastus approach. The deep fibers of the medial collateral ligament were elevated in a subperiosteal fashion off of the medial flare of the tibia so as to maintain a continuous soft tissue sleeve. The patella was subluxed laterally and the patellofemoral ligament was incised. Inspection of the knee demonstrated severe degenerative changes with full-thickness loss of articular cartilage. Osteophytes were debrided using a rongeur. Anterior and posterior cruciate ligaments were excised. Two 4.0 mm Schanz pins were inserted in the femur and into the tibia for attachment of the array of trackers used for computer-assisted navigation. Hip center was identified using a circumduction technique. Distal landmarks were mapped using the computer. The distal femur and proximal tibia were mapped using the computer. The distal femoral cutting guide was positioned using computer-assisted navigation so as to achieve a 5? distal valgus cut. The femur was sized and it was felt that a size 4 femoral component was appropriate. A size 4 femoral cutting guide was positioned and the anterior cut was performed and verified using the computer. This was followed by completion of the posterior and chamfer cuts. Femoral cutting guide for the central box was then positioned in the center box cut was performed. ? ?Attention was then directed to the proximal tibia. Medial and lateral menisci were excised. The extramedullary tibial cutting guide was positioned using computer-assisted navigation so as to achieve a 0? varus-valgus alignment and 3? posterior slope. The cut was performed and verified using the computer. The proximal tibia was sized and it was felt that a size 5 tibial tray was appropriate.  Tibial and femoral trials  were inserted followed by insertion of a 5 mm polyethylene insert. The knee was felt to be tight laterally.  The trial components were removed and the knee was brought into full extension and distracted using the Moreland retractors.  The posterolateral corner was carefully released using a combination of electrocautery and Metzenbaum scissors.  Trial components were reinserted followed by placement of a 5 mm polyethylene trial.  This allowed for excellent mediolateral soft tissue balancing both in flexion and in full extension. Finally, the patella was cut and prepared so as to accommodate a 35 mm medialized dome patella. A patella trial was placed and the knee was placed through a range of motion with excellent patellar tracking appreciated. The femoral trial was removed after debridement of posterior osteophytes. The central post-hole for the tibial component was reamed followed by insertion of a keel punch. Tibial trials were then removed. Cut surfaces of bone were irrigated with copious amounts of normal saline using pulsatile lavage and then suctioned dry. Polymethylmethacrylate cement with gentamicin was prepared in the usual fashion using a vacuum mixer. Cement was applied to the cut surface of the proximal tibia as well as along the undersurface of a size 5 rotating platform tibial component. Tibial component was positioned and impacted into place. Excess cement was removed using [employer] fast streamer. Cement was then applied to the cut surfaces of the femur as well as along the posterior flanges of the size 4 femoral component. The femoral component was positioned and impacted into place. Excess cement was removed using [employer] fast streamer. A 5 mm polyethylene trial was inserted and the knee was brought into full extension with steady axial compression applied. Finally, cement was applied to the backside of a 35 mm medialized dome patella and the patellar component was positioned and  patellar clamp applied. Excess cement was removed using [employer] fast streamer. After adequate curing of the cement, the tourniquet was deflated after a total tourniquet time of 108 minutes. Hemostasis was achieved using electrocautery. The knee was irrigated with copious amounts of normal saline using pulsatile lavage followed by 450 ml of Surgiphor and then suctioned dry. 20 mL of 1.3% Exparel and 60 mL of 0.25% Marcaine in 40 mL of normal saline was injected along the posterior capsule, medial and lateral gutters, and along the arthrotomy site. A 5 mm stabilized rotating platform polyethylene insert was inserted and the knee was placed through a range of motion with excellent mediolateral soft tissue balancing appreciated and excellent patellar tracking noted. 2 medium drains were placed in the wound bed and brought out through separate stab incisions. The medial parapatellar portion of the incision was reapproximated using interrupted sutures of #1 Vicryl. Subcutaneous tissue was approximated in layers using first #0 Vicryl followed #2-0 Vicryl. The skin was approximated with skin staples. A sterile dressing was applied. ? ?The patient tolerated the procedure well and was transported to the recovery room in stable condition.   ? ?Nasiir Monts P. Holley Bouche., M.D.  ?

## 2022-03-06 NOTE — Anesthesia Procedure Notes (Signed)
Procedure Name: Intubation ?Date/Time: 03/06/2022 11:44 AM ?Performed by: Zetta Bills, CRNA ?Pre-anesthesia Checklist: Patient identified, Patient being monitored, Timeout performed, Emergency Drugs available and Suction available ?Patient Re-evaluated:Patient Re-evaluated prior to induction ?Oxygen Delivery Method: Circle system utilized ?Preoxygenation: Pre-oxygenation with 100% oxygen ?Induction Type: IV induction ?Ventilation: Mask ventilation without difficulty ?Laryngoscope Size: 3 and McGraph ?Grade View: Grade I ?Tube type: Oral ?Tube size: 7.0 mm ?Number of attempts: 1 ?Airway Equipment and Method: Stylet ?Placement Confirmation: ETT inserted through vocal cords under direct vision, positive ETCO2 and breath sounds checked- equal and bilateral ?Secured at: 22 cm ?Tube secured with: Tape ?Dental Injury: Teeth and Oropharynx as per pre-operative assessment  ? ? ? ? ?

## 2022-03-06 NOTE — Anesthesia Preprocedure Evaluation (Addendum)
Anesthesia Evaluation  ?Patient identified by MRN, date of birth, ID band ?Patient awake ? ? ? ?Reviewed: ?Allergy & Precautions, NPO status , Patient's Chart, lab work & pertinent test results ? ?History of Anesthesia Complications ?Negative for: history of anesthetic complications ? ?Airway ?Mallampati: III ? ?TM Distance: <3 FB ?Neck ROM: full ? ? ? Dental ?no notable dental hx. ? ?  ?Pulmonary ?sleep apnea , COPD, former smoker,  ?  ?Pulmonary exam normal ? ? ? ? ? ? ? Cardiovascular ?hypertension, Pt. on medications ?(-) anginaNormal cardiovascular exam ? ? ?  ?Neuro/Psych ?Multiple Sclerosis- states not on immunosuppression and has no active spinal lesions ? Neuromuscular disease (Nueropathy) negative psych ROS  ? GI/Hepatic ?Neg liver ROS, GERD  Controlled,  ?Endo/Other  ?diabetes (A1C 6.2), Well Controlled, Type 2 ? Renal/GU ?Renal InsufficiencyRenal disease  ?negative genitourinary ?  ?Musculoskeletal ? ?(+) Arthritis ,  ? Abdominal ?Normal abdominal exam  (+)   ?Peds ? Hematology ? ?(+) Blood dyscrasia, anemia ,   ?Anesthesia Other Findings ?Past Medical History: ?No date: Anemia ?not really diagnosis but have pain: Arthritis ?07/23/2016: B12 deficiency ?    Comment:  Will check levels to determine if injections are needed  ?             again. Await results. Recent CBC at [facility] W[facility] ?1983 had a miscarriage/dnc: Blood transfusion without reported  ?diagnosis ?12/30/2013: Cancer ([place]) ?    Comment:  Multifocal disease: pT1c,m, N0(isolated tumor cells) Er/ ?             PR positive, Her 2 negative. ?01/09/2014: Cancer ([place]) ?    Comment:  Invasive lobular carcinoma, 2.2 cm, T2,N1 ER/PR  ?             positive, HER-2/neu negative ?2014?: Cataract ?10/27/2021: Centrilobular emphysema ([place]) ?No date: Diabetes mellitus without complication ([place]) ?No date: Diffuse cystic mastopathy ?09/24/2017: Essential hypertension, benign ?2012: Family history of malignant neoplasm of  gastrointestinal tract ?08/28/2014: Fractured elbow ?No date: Gastroesophageal reflux disease ?No date: Hx of metabolic acidosis with increased anion gap ?No date: Increased heart rate ?No date: Multiple sclerosis ([place]) ?No date: Neuromuscular disorder ([place]) ?    Comment:  neuropathy in bil feet - left is worse. ?No date: Obesity, unspecified ?No date: Peripheral neuropathy ?No date: Personal history of tobacco use, presenting hazards to health ?No date: Restless leg syndrome ?No date: Sleep apnea ?    Comment:  uses CPAP ?No date: Special screening for malignant neoplasms, colon ? ?Past Surgical History: ?8119,1478: BREAST BIOPSY; Right ?11-07-13: BREAST BIOPSY; Right ?    Comment:  INVASIVE MAMMARY CARCINOMA , ER/PR positive Her2  ?             negative ?11-27-13: BREAST BIOPSY; Left ?    Comment:  invasive lobular and DCIS ?01/09/14: BREAST SURGERY; Bilateral ?    Comment:  mastectomy ?No date: CHOLECYSTECTOMY ?2010: COLONOSCOPY ?    Comment:  Dr. Jamal Collin ?06/11/2015: COLONOSCOPY WITH PROPOFOL; N/A ?    Comment:  Procedure: COLONOSCOPY WITH PROPOFOL;  Surgeon:  ?             Seeplaputhur Robinette Haines, MD;  Location: [facility] ENDOSCOPY;   ?             Service: Endoscopy;  Laterality: N/A; ?07/12/2018: DILATATION & CURETTAGE/HYSTEROSCOPY WITH MYOSURE; N/A ?    Comment:  Procedure: DILATATION & CURETTAGE/HYSTEROSCOPY WITH  ?             MYOSURE  WITH ENDOMETRIAL POLYPECTOMY;  Surgeon: Glennon Mac,  ?             Estill Bamberg, MD;  Location: [facility] ORS;  Service: Gynecology; ?             Laterality: N/A; ?1983: DILATION AND CURETTAGE OF UTERUS ?11/23/2015: JOINT REPLACEMENT ?1989: POLYPECTOMY ?No date: PORTA CATH REMOVAL ?2015: PORTACATH PLACEMENT ?09/14/14: SPINE SURGERY ?    Comment:  Ruptured disk L4- L5 ?11/22/2015: TOTAL KNEE ARTHROPLASTY; Left ?    Comment:  Procedure: TOTAL KNEE ARTHROPLASTY;  Surgeon: Quillian Quince  ?             French Ana, MD;  Location: [facility];  Service: Orthopedics;   ?             Laterality: Left; ?No date: TUBAL  LIGATION ?2006: WISDOM TOOTH EXTRACTION ? ?BMI   ? Body Mass Index: 34.67 kg/m?  ?  ? ? Reproductive/Obstetrics ?negative OB ROS ? ?  ? ? ? ? ? ? ? ? ? ? ? ? ? ?  ?  ? ? ? ? ? ? ? ?Anesthesia Physical ? ?Anesthesia Plan ? ?ASA: 3 ? ?Anesthesia Plan: General  ? ?Post-op Pain Management: Regional block* and Ofirmev IV (intra-op)*  ? ?Induction: Intravenous ? ?PONV Risk Score and Plan: 2 and Ondansetron and Dexamethasone ? ?Airway Management Planned: Oral ETT ? ?Additional Equipment:  ? ?Intra-op Plan:  ? ?Post-operative Plan: Extubation in OR ? ?Informed Consent: I have reviewed the patients History and Physical, chart, labs and discussed the procedure including the risks, benefits and alternatives for the proposed anesthesia with the patient or authorized representative who has indicated his/her understanding and acceptance.  ? ? ? ?Dental advisory given ? ?Plan Discussed with: Anesthesiologist, CRNA and Surgeon ? ?Anesthesia Plan Comments: (Patient consented for risks of anesthesia including but not limited to:  ?- adverse reactions to medications ?- risk of airway placement if required ?- damage to eyes, teeth, lips or other oral mucosa ?- nerve damage due to positioning  ?- sore throat or hoarseness ?- Damage to heart, brain, nerves, lungs, other parts of body or loss of life ? ?Patient voiced understanding.)  ? ? ? ? ? ?Anesthesia Quick Evaluation ? ?

## 2022-03-06 NOTE — H&P (Signed)
The patient has been re-examined, and the chart reviewed, and there have been no interval changes to the documented history and physical.    The risks, benefits, and alternatives have been discussed at length. The patient expressed understanding of the risks benefits and agreed with plans for surgical intervention.  Kathlyne Loud P. Arnella Pralle, Jr. M.D.    

## 2022-03-06 NOTE — Progress Notes (Signed)
PHARMACIST - PHYSICIAN ORDER COMMUNICATION ? ?CONCERNING: P&T Medication Policy on Herbal Medications ? ?DESCRIPTION:  This patient?s order for:  Cassidy Norris  has been noted. ? ?This product(s) is classified as an ?herbal? or natural product. ?Due to a lack of definitive safety studies or FDA approval, nonstandard manufacturing practices, plus the potential risk of unknown drug-drug interactions while on inpatient medications, the Pharmacy and Therapeutics Committee does not permit the use of ?herbal? or natural products of this type within [facility]. ?  ?ACTION TAKEN: ?The pharmacy department is unable to verify this order at this time. ?Please reevaluate patient?s clinical condition at discharge and address if the herbal or natural product(s) should be resumed at that time. ? ?Paulina Fusi, PharmD, BCPS ?03/06/2022 ?5:41 PM ? ?

## 2022-03-07 DIAGNOSIS — E119 Type 2 diabetes mellitus without complications: Secondary | ICD-10-CM | POA: Diagnosis not present

## 2022-03-07 DIAGNOSIS — Z853 Personal history of malignant neoplasm of breast: Secondary | ICD-10-CM | POA: Diagnosis not present

## 2022-03-07 DIAGNOSIS — Z7984 Long term (current) use of oral hypoglycemic drugs: Secondary | ICD-10-CM | POA: Diagnosis not present

## 2022-03-07 DIAGNOSIS — M1711 Unilateral primary osteoarthritis, right knee: Secondary | ICD-10-CM | POA: Diagnosis not present

## 2022-03-07 DIAGNOSIS — I10 Essential (primary) hypertension: Secondary | ICD-10-CM | POA: Diagnosis not present

## 2022-03-07 DIAGNOSIS — Z96652 Presence of left artificial knee joint: Secondary | ICD-10-CM | POA: Diagnosis not present

## 2022-03-07 LAB — GLUCOSE, CAPILLARY
Glucose-Capillary: 136 mg/dL — ABNORMAL HIGH (ref 70–99)
Glucose-Capillary: 141 mg/dL — ABNORMAL HIGH (ref 70–99)

## 2022-03-07 MED ORDER — CELECOXIB 200 MG PO CAPS: 200.0000 mg | ORAL_CAPSULE | Freq: Two times a day (BID) | ORAL | 0 refills | Status: DC

## 2022-03-07 MED ORDER — OXYCODONE HCL 5 MG PO TABS: 5.0000 mg | ORAL_TABLET | ORAL | 0 refills | Status: DC | PRN

## 2022-03-07 MED ORDER — SENNOSIDES-DOCUSATE SODIUM 8.6-50 MG PO TABS: 1.0000 | ORAL_TABLET | Freq: Two times a day (BID) | ORAL | Status: DC

## 2022-03-07 MED ORDER — ENOXAPARIN SODIUM 30 MG/0.3ML IJ SOSY: 30.0000 mg | PREFILLED_SYRINGE | Freq: Two times a day (BID) | INTRAMUSCULAR | 0 refills | Status: DC

## 2022-03-07 MED ORDER — TRAMADOL HCL 50 MG PO TABS: 50.0000 mg | ORAL_TABLET | ORAL | 0 refills | Status: DC | PRN

## 2022-03-07 NOTE — Progress Notes (Signed)
?  Transition of Care (TOC) Screening Note ? ? ?Patient Details  ?Name: Cassidy Norris ?Date of Birth: [birth year] ? ? ?Transition of Care (TOC) CM/SW Contact:    ?Harriet Masson, RN ?Phone Number:[phone] ?03/07/2022, 3:00 PM ? ? ? ?Transition of Care Department [facility]) has reviewed patient and no TOC needs have been identified at this time. We will continue to monitor patient advancement through interdisciplinary progression rounds. If new patient transition needs arise, please place a TOC consult. ?  ?

## 2022-03-07 NOTE — Evaluation (Signed)
Occupational Therapy Evaluation ?Patient Details ?Name: Cassidy Norris ?MRN: [id number] ?DOB: 25-Sep-1951 ?Today's Date: 03/07/2022 ? ? ?History of Present Illness Cassidy Norris is a 71 year old woman with multiple sclerosis, type 2 diabetes, former smoker and history of bilateral breast cancer 2015 s/p bilateral mastectomy now s/p R TKR on 03/07/22.  ? ?Clinical Impression ?  ?Cassidy Norris was seen for OT evaluation this date, POD#1 from above surgery. Pt was independent in all ADLs prior to surgery. Pt lives alone in home c 4 STE, family available PRN, recently got new puppy however has fenced in yard. Pt requires SUPERVISION + RW for ADL t/f and functional reaching task with single UE support. MOD I don B socks seated EOB. Pt instructed in polar care mgt, falls prevention strategies, and compression stocking mgt. All education complete, no skilled OT needs identified, will sign off. Do not currently anticipate any OT needs following this hospitalization.   ?   ? ?Recommendations for follow up therapy are one component of a multi-disciplinary discharge planning process, led by the attending physician.  Recommendations may be updated based on patient status, additional functional criteria and insurance authorization.  ? ?Follow Up Recommendations ? No OT follow up  ?  ?Assistance Recommended at Discharge Set up Supervision/Assistance  ?Patient can return home with the following Assistance with cooking/housework;Help with stairs or ramp for entrance ? ?  ?Functional Status Assessment ? Patient has had a recent decline in their functional status and demonstrates the ability to make significant improvements in function in a reasonable and predictable amount of time.  ?Equipment Recommendations ? None recommended by OT  ?  ?Recommendations for Other Services   ? ? ?  ?Precautions / Restrictions Precautions ?Precautions: Knee ?Restrictions ?Weight Bearing Restrictions: Yes ?RLE Weight Bearing: Weight bearing as tolerated   ? ?  ? ?Mobility Bed Mobility ?Overal bed mobility: Independent ?  ?  ?  ?  ?  ?  ?General bed mobility comments: from flat bed ?  ? ?Transfers ?Overall transfer level: Modified independent ?Equipment used: Rolling walker (2 wheels) ?  ?  ?  ?  ?  ?  ?  ?  ?  ? ?  ?Balance Overall balance assessment: Needs assistance ?Sitting-balance support: No upper extremity supported, Feet supported ?Sitting balance-Leahy Scale: Normal ?  ?  ?Standing balance support: Single extremity supported, During functional activity ?Standing balance-Leahy Scale: Good ?  ?  ?  ?  ?  ?  ?  ?  ?  ?  ?  ?  ?   ? ?ADL either performed or assessed with clinical judgement  ? ?ADL Overall ADL's : Needs assistance/impaired ?  ?  ?  ?  ?  ?  ?  ?  ?  ?  ?  ?  ?  ?  ?  ?  ?  ?  ?  ?General ADL Comments: SUPERVISION + RW for ADL t/f and functional reaching task with single UE support. MOD I don B socks seated EOB.  ? ? ? ? ?Pertinent Vitals/Pain Pain Assessment ?Pain Assessment: No/denies pain  ? ? ? ?Hand Dominance   ?  ?Extremity/Trunk Assessment Upper Extremity Assessment ?Upper Extremity Assessment: Overall WFL for tasks assessed ?  ?Lower Extremity Assessment ?Lower Extremity Assessment: Overall WFL for tasks assessed ?  ?  ?  ?Communication   ?  ?Cognition Arousal/Alertness: Awake/alert ?Behavior During Therapy: [facility] for tasks assessed/performed ?Overall Cognitive Status: Within Functional Limits for tasks assessed ?  ?  ?  ?  ?  ?  ?  ?  ?  ?  ?  ?  ?  ?  ?  ?  ?  ?  ?  ?   ?   ?   ? ? ?  Home Living Family/patient expects to be discharged to:: Private residence ?Living Arrangements: Alone ?Available Help at Discharge: Family;Available PRN/intermittently ?Type of Home: House ?Home Access: Stairs to enter ?Entrance Stairs-Number of Steps: 4 ?Entrance Stairs-Rails: Left ?Home Layout: One level ?  ?  ?Bathroom Shower/Tub: Tub/shower unit ?  ?Bathroom Toilet: Handicapped height ?  ?  ?Home Equipment: Conservation officer, nature (2 wheels);Rollator (4  wheels);BSC/3in1 ?  ?  ?  ? ?  ?Prior Functioning/Environment Prior Level of Function : Independent/Modified Independent;Driving ?  ?  ?  ?  ?  ?  ?Mobility Comments: 6EG community mobility ?  ?  ? ?  ?  ?OT Problem List: Decreased range of motion;Decreased activity tolerance ?  ?   ?   ? ?AM-PAC OT "6 Clicks" Daily Activity     ?Outcome Measure Help from another person eating meals?: None ?Help from another person taking care of personal grooming?: A Little ?Help from another person toileting, which includes using toliet, bedpan, or urinal?: A Little ?Help from another person bathing (including washing, rinsing, drying)?: A Little ?Help from another person to put on and taking off regular upper body clothing?: None ?Help from another person to put on and taking off regular lower body clothing?: None ?6 Click Score: 21 ?  ?End of Session   ? ?Activity Tolerance: Patient tolerated treatment well ?Patient left: in chair;with call bell/phone within reach ? ?OT Visit Diagnosis: Other abnormalities of gait and mobility (R26.89)  ?              ?Time: 3151-7616 ?OT Time Calculation (min): 19 min ?Charges:  OT General Charges ?$OT Visit: 1 Visit ?OT Evaluation ?$OT Eval Low Complexity: 1 Low ?OT Treatments ?$Self Care/Home Management : 8-22 mins ? ?Dessie Coma, M.S. OTR/L  ?03/07/22, 9:06 AM  ?ascom [phone] ? ?

## 2022-03-07 NOTE — Plan of Care (Signed)

## 2022-03-07 NOTE — Evaluation (Signed)
Physical Therapy Evaluation ?Patient Details ?Name: Cassidy Norris ?MRN: [id number] ?DOB: Apr 16, 1951 ?Today's Date: 03/07/2022 ? ?History of Present Illness ? Cassidy Norris is a 71 year old woman with multiple sclerosis, type 2 diabetes, former smoker and history of bilateral breast cancer 2015 s/p bilateral mastectomy now s/p R TKR on 03/07/22. ?  ?Clinical Impression ? Patient received in recliner. She is agreeable to PT session. Patient is mod independent with transfers, ambulated 175 feet with RW and supervision. Ambulated up/down 4 steps with B rails and supervision. Patient moving very well and should be ready to discharge home this pm. She will continue to benefit from skilled PT while here to improve mobility and independence.     ?   ? ?Recommendations for follow up therapy are one component of a multi-disciplinary discharge planning process, led by the attending physician.  Recommendations may be updated based on patient status, additional functional criteria and insurance authorization. ? ?Follow Up Recommendations Follow physician's recommendations for discharge plan and follow up therapies ? ?  ?Assistance Recommended at Discharge PRN  ?Patient can return home with the following ? Help with stairs or ramp for entrance;Assistance with cooking/housework;Assist for transportation ? ?  ?Equipment Recommendations None recommended by PT  ?Recommendations for Other Services ?    ?  ?Functional Status Assessment Patient has had a recent decline in their functional status and demonstrates the ability to make significant improvements in function in a reasonable and predictable amount of time.  ? ?  ?Precautions / Restrictions Precautions ?Precautions: Fall ?Restrictions ?Weight Bearing Restrictions: Yes ?RLE Weight Bearing: Weight bearing as tolerated  ? ?  ? ?Mobility ? Bed Mobility ?  ?  ?  ?  ?  ?  ?  ?General bed mobility comments: NT, patient in recliner ?  ? ?Transfers ?Overall transfer level: Modified  independent ?Equipment used: Rolling walker (2 wheels) ?  ?  ?  ?  ?  ?  ?  ?General transfer comment: no assist needed from chair or toilet ?  ? ?Ambulation/Gait ?Ambulation/Gait assistance: Supervision ?Gait Distance (Feet): 175 Feet ?Assistive device: Rolling walker (2 wheels) ?Gait Pattern/deviations: Step-through pattern, WFL(Within Functional Limits) ?Gait velocity: WFL ?  ?  ?General Gait Details: no difficulties with ambulation ? ?Stairs ?Stairs: Yes ?Stairs assistance: Supervision ?Stair Management: Two rails, Step to pattern ?Number of Stairs: 4 ?General stair comments: safe on steps ? ?Wheelchair Mobility ?  ? ?Modified Rankin (Stroke Patients Only) ?  ? ?  ? ?Balance Overall balance assessment: Modified Independent ?Sitting-balance support: Feet supported ?Sitting balance-Leahy Scale: Normal ?  ?  ?Standing balance support: Bilateral upper extremity supported, During functional activity ?Standing balance-Leahy Scale: Good ?  ?  ?  ?  ?  ?  ?  ?  ?  ?  ?  ?  ?   ? ? ? ?Pertinent Vitals/Pain Pain Assessment ?Pain Assessment: 0-10 ?Pain Score: 1  ?Pain Location: R knee ?Pain Descriptors / Indicators: Discomfort ?Pain Intervention(s): Monitored during session, Repositioned, Premedicated before session, Ice applied  ? ? ?Home Living Family/patient expects to be discharged to:: Private residence ?Living Arrangements: Alone ?Available Help at Discharge: Family;Available PRN/intermittently ?Type of Home: House ?Home Access: Stairs to enter ?Entrance Stairs-Rails: Left ?Entrance Stairs-Number of Steps: 4 ?  ?Home Layout: One level ?Home Equipment: Conservation officer, nature (2 wheels);Rollator (4 wheels);BSC/3in1;Cane - single point ?   ?  ?Prior Function Prior Level of Function : Independent/Modified Independent;Driving ?  ?  ?  ?  ?  ?  ?  Mobility Comments: 6KG community mobility ?  ?  ? ? ?Hand Dominance  ? Dominant Hand: Right ? ?  ?Extremity/Trunk Assessment  ? Upper Extremity Assessment ?Upper Extremity Assessment:  Defer to OT evaluation ?  ? ?Lower Extremity Assessment ?Lower Extremity Assessment: Overall WFL for tasks assessed ?  ? ?Cervical / Trunk Assessment ?Cervical / Trunk Assessment: Normal  ?Communication  ? Communication: No difficulties  ?Cognition Arousal/Alertness: Awake/alert ?Behavior During Therapy: [facility] for tasks assessed/performed ?Overall Cognitive Status: Within Functional Limits for tasks assessed ?  ?  ?  ?  ?  ?  ?  ?  ?  ?  ?  ?  ?  ?  ?  ?  ?  ?  ?  ? ?  ?General Comments   ? ?  ?Exercises Total Joint Exercises ?Ankle Circles/Pumps: AROM, Both, 10 reps ?Hip ABduction/ADduction: AROM, Right, 10 reps ?Straight Leg Raises: AROM, Left, 10 reps ?Long Arc Quad: AROM, Left, 10 reps ?Goniometric ROM: 0-95  ? ?Assessment/Plan  ?  ?PT Assessment Patient needs continued PT services  ?PT Problem List Decreased activity tolerance;Decreased mobility;Pain ? ?   ?  ?PT Treatment Interventions DME instruction;Therapeutic exercise;Gait training;Stair training;Functional mobility training;Therapeutic activities;Patient/family education   ? ?PT Goals (Current goals can be found in the Care Plan section)  ?Acute Rehab PT Goals ?Patient Stated Goal: to go home today ?PT Goal Formulation: With patient ?Time For Goal Achievement: 03/10/22 ?Potential to Achieve Goals: Good ? ?  ?Frequency BID ?  ? ? ?Co-evaluation   ?  ?  ?  ?  ? ? ?  ?AM-PAC PT "6 Clicks" Mobility  ?Outcome Measure Help needed turning from your back to your side while in a flat bed without using bedrails?: None ?Help needed moving from lying on your back to sitting on the side of a flat bed without using bedrails?: None ?Help needed moving to and from a bed to a chair (including a wheelchair)?: None ?Help needed standing up from a chair using your arms (e.g., wheelchair or bedside chair)?: None ?Help needed to walk in hospital room?: A Little ?Help needed climbing 3-5 steps with a railing? : A Little ?6 Click Score: 22 ? ?  ?End of Session Equipment Utilized  During Treatment: Gait belt ?Activity Tolerance: Patient tolerated treatment well ?Patient left: in chair;with call bell/phone within reach ?Nurse Communication: Mobility status ?PT Visit Diagnosis: Muscle weakness (generalized) (M62.81);Pain ?Pain - Right/Left: Right ?Pain - part of body: Knee ?  ? ?Time: 8811-0315 ?PT Time Calculation (min) (ACUTE ONLY): 19 min ? ? ?Charges:   PT Evaluation ?$PT Eval Moderate Complexity: 1 Mod ?PT Treatments ?$Gait Training: 8-22 mins ?  ?   ? ? ?Amanda Cockayne, PT, GCS ?03/07/22,9:23 AM ? ?

## 2022-03-07 NOTE — Discharge Summary (Signed)
?Physician Discharge Summary  ?Patient ID: ?Cassidy Norris ?MRN: [id number] ?DOB/AGE: [birth year] 71 y.o. ? ?Admit date: 03/06/2022 ?Discharge date: 03/07/2022 ? ?Admission Diagnoses:  ?Total knee replacement status [Z96.659] ? ? ?Discharge Diagnoses: ?Patient Active Problem List  ? Diagnosis Date Noted  ? Total knee replacement status 03/06/2022  ? Recurrent breast cancer ([place]) 12/30/2021  ? Erosive esophagitis   ? Polyp of descending colon   ? Centrilobular emphysema ([place]) 10/27/2021  ? S/P total knee arthroplasty, left 08/24/2021  ? Aortic atherosclerosis ([place]) 07/08/2020  ? Elevated LFTs 01/04/2020  ? Drug-induced myopathy 04/21/2019  ? Statin intolerance 07/22/2018  ? Multiple sclerosis ([place]) 07/22/2018  ? Endometrial polyp 06/27/2018  ? Postmenopausal bleeding 06/27/2018  ? Gastroesophageal reflux disease 01/20/2018  ? History of non anemic vitamin B12 deficiency 10/22/2017  ? Former smoker 09/24/2017  ? History of bilateral breast cancer 09/24/2017  ? Essential hypertension, benign 09/24/2017  ? Hyperlipidemia associated with type 2 diabetes mellitus ([place]) 07/13/2017  ? Microalbuminuria 01/12/2017  ? Type 2 diabetes mellitus with other specified complication ([place]) 70/78/6754  ? Osteoarthritis of knee 11/22/2015  ? Personal history of colonic polyps 10/10/2015  ? Family history of colon cancer 10/10/2015  ? Elevated fasting blood sugar 09/23/2015  ? Restless leg 07/22/2015  ? Insomnia 07/22/2015  ? Peripheral neuropathy due to chemotherapy ([place]) 03/19/2015  ? Lumbar radiculopathy 03/19/2015  ? Family history of cancer of digestive system   ? ? ?Past Medical History:  ?Diagnosis Date  ? Anemia   ? Arthritis not really diagnosis but have pain  ? B12 deficiency 07/23/2016  ? Will check levels to determine if injections are needed again. Await results. Recent CBC at Onc WNL  ? Blood transfusion without reported diagnosis 1983 had a miscarriage/dnc  ? Cancer ([place]) 12/30/2013  ? Multifocal disease: pT1c,m,  N0(isolated tumor cells) Er/ PR positive, Her 2 negative.  ? Cancer ([place]) 01/09/2014  ? Invasive lobular carcinoma, 2.2 cm, T2,N1 ER/PR positive, HER-2/neu negative  ? Cataract 2014?  ? Centrilobular emphysema ([place]) 10/27/2021  ? Complication of anesthesia 03/06/2022  ? Prolonged metabolism of Rocuronium during case.  ? Diabetes mellitus without complication ([place])   ? Diffuse cystic mastopathy   ? Erosive esophagitis   ? Essential hypertension, benign 09/24/2017  ? Family history of malignant neoplasm of gastrointestinal tract 2012  ? Fractured elbow 08/28/2014  ? Gastroesophageal reflux disease   ? Hx of metabolic acidosis with increased anion gap   ? Increased heart rate   ? Multiple sclerosis ([place])   ? Neuromuscular disorder ([place])   ? neuropathy in bil feet - left is worse.  ? Obesity, unspecified   ? Peripheral neuropathy   ? Personal history of tobacco use, presenting hazards to health   ? Restless leg syndrome   ? Sleep apnea   ? uses CPAP  ? Special screening for malignant neoplasms, colon   ? ?  ?Transfusion: none ?  ?Consultants (if any):  ? ?Discharged Condition: Improved ? ?Hospital Course: Cassidy Norris is an 71 y.o. female who was admitted 03/06/2022 with a diagnosis of Total knee replacement status and went to the operating room on 03/06/2022 and underwent the above named procedures.  ?  ?Surgeries: Procedure(s): ?COMPUTER ASSISTED TOTAL KNEE ARTHROPLASTY on 03/06/2022 ?Patient tolerated the surgery well. Taken to PACU where she was stabilized and then transferred to the orthopedic floor. ? ?Started on Lovenox 30 mg Q12 hrs. Foot pumps applied bilaterally at 80 mm. Heels elevated on bed with rolled  towels. No evidence of DVT. Negative [place]. ?Physical therapy started on day #1 for gait training and transfer. OT started day #1 for ADL and assisted devices. ? ?Patient's foley was d/c on day #1. Patient's IV and hemovac was d/c on day #1. ? ?On post op day #1 patient was stable and ready for discharge to  home with home health PT. ? ?Implants: DePuy Attune size 4 posterior stabilized femoral component (cemented), size 5 rotating platform tibial component (cemented), 35 mm medialized dome patella (cemented), and a 5 mm stabilized rotating platform polyethylene insert. ? ?She was given perioperative antibiotics:  ?Anti-infectives (From admission, onward)  ? ? Start     Dose/Rate Route Frequency Ordered Stop  ? 03/06/22 1815  ceFAZolin (ANCEF) IVPB 2g/100 mL premix       ? 2 g ?200 mL/hr over 30 Minutes Intravenous Every 6 hours 03/06/22 1727 03/07/22 0034  ? 03/06/22 0945  ceFAZolin (ANCEF) 2-4 GM/100ML-% IVPB       ?Note to Pharmacy: Jordan Hawks H: cabinet override  ?    03/06/22 0945 03/06/22 1155  ? 03/06/22 0600  ceFAZolin (ANCEF) IVPB 2g/100 mL premix       ? 2 g ?200 mL/hr over 30 Minutes Intravenous On call to O.R. 03/05/22 2211 03/06/22 1200  ? ?  ?. ? ?She was given sequential compression devices, early ambulation, and Lovenox, teds for DVT prophylaxis. ? ?She benefited maximally from the hospital stay and there were no complications.   ? ?Recent vital signs:  ?Vitals:  ? 03/06/22 2238 03/07/22 0321  ?BP:  116/65  ?Pulse: (!) 106 92  ?Resp: 16 16  ?Temp:  98 ?F (36.7 ?C)  ?SpO2: 96% 99%  ? ? ?Recent laboratory studies:  ?Lab Results  ?Component Value Date  ? HGB 10.4 (L) 02/23/2022  ? HGB 10.6 (L) 02/05/2022  ? HGB 10.6 (L) 01/28/2022  ? ?Lab Results  ?Component Value Date  ? WBC 5.0 02/23/2022  ? PLT 270 02/23/2022  ? ?Lab Results  ?Component Value Date  ? INR 1.04 11/12/2015  ? ?Lab Results  ?Component Value Date  ? NA 140 02/23/2022  ? K 3.9 02/23/2022  ? CL 102 02/23/2022  ? CO2 27 02/23/2022  ? BUN 21 02/23/2022  ? CREATININE 1.40 (H) 02/23/2022  ? GLUCOSE 112 (H) 02/23/2022  ? ? ?Discharge Medications:   ?Allergies as of 03/07/2022   ?No Known Allergies ?  ? ?  ?Medication List  ?  ? ?TAKE these medications   ? ?acetaminophen 500 MG tablet ?Commonly known as: TYLENOL ?Take 1,000 mg by mouth 2 (two)  times daily as needed for moderate pain or headache. ?  ?baclofen 10 MG tablet ?Commonly known as: LIORESAL ?Take 1 tablet (10 mg total) by mouth 3 (three) times daily as needed for muscle spasms. ?What changed: when to take this ?  ?calcium carbonate 1500 (600 Ca) MG Tabs tablet ?Commonly known as: OSCAL ?Take by mouth 2 (two) times daily with a meal. ?  ?celecoxib 200 MG capsule ?Commonly known as: CELEBREX ?Take 1 capsule (200 mg total) by mouth 2 (two) times daily. ?  ?diclofenac Sodium 1 % Gel ?Commonly known as: VOLTAREN ?Apply 2 g topically 3 (three) times daily as needed. ?What changed: reasons to take this ?  ?enoxaparin 30 MG/0.3ML injection ?Commonly known as: LOVENOX ?Inject 0.3 mLs (30 mg total) into the skin every 12 (twelve) hours for 14 days. ?  ?famotidine 20 MG tablet ?Commonly known as: PEPCID ?TAKE  1 TABLET BY MOUTH TWICE A DAY BEFORE MEALS ?  ?fluticasone 50 MCG/ACT nasal spray ?Commonly known as: FLONASE ?PLACE 2 SPRAYS INTO BOTH NOSTRILS AT BEDTIME. ?  ?fulvestrant 250 MG/5ML injection ?Commonly known as: FASLODEX ?Inject 500 mg into the muscle every 30 (thirty) days. One injection each buttock over 1-2 minutes. Warm prior to use. ?  ?Kisqali (600 MG Dose) 200 MG Therapy Pack ?Generic drug: ribociclib succ ?Take 3 tablets (600 mg total) by mouth daily. Take for 21 days on, 7 days off, repeat every 28 days. ?  ?Krill Oil 1000 MG Caps ?Take 1,000 mg by mouth daily. ?  ?lisinopril 5 MG tablet ?Commonly known as: ZESTRIL ?TAKE 1 TABLET BY MOUTH EVERY DAY IN THE EVENING ?  ?loperamide 2 MG tablet ?Commonly known as: IMODIUM A-D ?Take 2 tablets (4 mg total) by mouth 4 (four) times daily as needed for diarrhea or loose stools. ?  ?metFORMIN 500 MG tablet ?Commonly known as: GLUCOPHAGE ?TAKE 1 TABLET BY MOUTH 2 TIMES DAILY WITH A MEAL. ?  ?omeprazole 40 MG capsule ?Commonly known as: PRILOSEC ?TAKE 1 CAPSULE (40 MG TOTAL) BY MOUTH 2 (TWO) TIMES DAILY BEFORE A MEAL. ?  ?oxyCODONE 5 MG immediate release  tablet ?Commonly known as: Oxy IR/ROXICODONE ?Take 1 tablet (5 mg total) by mouth every 4 (four) hours as needed for moderate pain (pain score 4-6). ?  ?polyethylene glycol powder 17 GM/SCOOP powder ?Commonly

## 2022-03-07 NOTE — Progress Notes (Signed)
? ?Subjective: ?1 Day Post-Op Procedure(s) (LRB): ?COMPUTER ASSISTED TOTAL KNEE ARTHROPLASTY (Right) ?Patient reports pain as mild.   ?Patient is well, and has had no acute complaints or problems ?Denies any CP, SOB, ABD pain. ?We will continue therapy today.  ?Plan is to go Home after hospital stay. ? ?Objective: ?Vital signs in last 24 hours: ?Temp:  [97.1 ?F (36.2 ?C)-99.4 ?F (37.4 ?C)] 98 ?F (36.7 ?C) (04/15 0321) ?Pulse Rate:  [92-112] 92 (04/15 0321) ?Resp:  [14-20] 16 (04/15 0321) ?BP: (111-135)/(62-80) 116/65 (04/15 0321) ?SpO2:  [96 %-99 %] 99 % (04/15 0321) ?Weight:  [87.1 kg] 87.1 kg (04/14 0950) ? ?Intake/Output from previous day: ?04/14 0701 - 04/15 0700 ?In: 2410 [P.O.:240; I.V.:1672.7; IV Piggyback:497.3] ?Out: 505 [Urine:225; Drains:230; Blood:50] ?Intake/Output this shift: ?No intake/output data recorded. ? ?No results for input(s): HGB in the last 72 hours. ?No results for input(s): WBC, RBC, HCT, PLT in the last 72 hours. ?No results for input(s): NA, K, CL, CO2, BUN, CREATININE, GLUCOSE, CALCIUM in the last 72 hours. ?No results for input(s): LABPT, INR in the last 72 hours. ? ?EXAM ?General - Patient is Alert, Appropriate, and Oriented ?Extremity - Neurovascular intact ?Sensation intact distally ?Intact pulses distally ?Dorsiflexion/Plantar flexion intact ?No cellulitis present ?Compartment soft ?Dressing - dressing C/D/I and no drainage, Hemovac intact without drainage ?Motor Function - intact, moving foot and toes well on exam.  ? ?Past Medical History:  ?Diagnosis Date  ? Anemia   ? Arthritis not really diagnosis but have pain  ? B12 deficiency 07/23/2016  ? Will check levels to determine if injections are needed again. Await results. Recent CBC at Onc WNL  ? Blood transfusion without reported diagnosis 1983 had a miscarriage/dnc  ? Cancer ([facility]) 12/30/2013  ? Multifocal disease: pT1c,m, N0(isolated tumor cells) Er/ PR positive, Her 2 negative.  ? Cancer ([facility]) 01/09/2014  ? Invasive lobular  carcinoma, 2.2 cm, T2,N1 ER/PR positive, HER-2/neu negative  ? Cataract 2014?  ? Centrilobular emphysema ([facility]) 10/27/2021  ? Complication of anesthesia 03/06/2022  ? Prolonged metabolism of Rocuronium during case.  ? Diabetes mellitus without complication ([facility])   ? Diffuse cystic mastopathy   ? Erosive esophagitis   ? Essential hypertension, benign 09/24/2017  ? Family history of malignant neoplasm of gastrointestinal tract 2012  ? Fractured elbow 08/28/2014  ? Gastroesophageal reflux disease   ? Hx of metabolic acidosis with increased anion gap   ? Increased heart rate   ? Multiple sclerosis ([facility])   ? Neuromuscular disorder ([facility])   ? neuropathy in bil feet - left is worse.  ? Obesity, unspecified   ? Peripheral neuropathy   ? Personal history of tobacco use, presenting hazards to health   ? Restless leg syndrome   ? Sleep apnea   ? uses CPAP  ? Special screening for malignant neoplasms, colon   ? ? ?Assessment/Plan:   ?1 Day Post-Op Procedure(s) (LRB): ?COMPUTER ASSISTED TOTAL KNEE ARTHROPLASTY (Right) ?Principal Problem: ?  Total knee replacement status ? ?Estimated body mass index is 32.96 kg/m? as calculated from the following: ?  Height as of this encounter: 5' 4" (1.626 m). ?  Weight as of this encounter: 87.1 kg. ?Advance diet ?Up with therapy\ ?Vital signs are stable ?Pain well controlled ?Care management to assist with discharge to home with home health PT.  Anticipate discharge to home today pending completion of PT goals ? ?DVT Prophylaxis - Lovenox, TED hose, and SCDs ?Weight-Bearing as tolerated to right leg ? ? ?T. Chris   Arvella Nigh, PA-C ?[place] ?03/07/2022, 7:52 AM ?  ?

## 2022-03-08 DIAGNOSIS — N840 Polyp of corpus uteri: Secondary | ICD-10-CM | POA: Diagnosis not present

## 2022-03-08 DIAGNOSIS — Z7951 Long term (current) use of inhaled steroids: Secondary | ICD-10-CM | POA: Diagnosis not present

## 2022-03-08 DIAGNOSIS — G4730 Sleep apnea, unspecified: Secondary | ICD-10-CM | POA: Diagnosis not present

## 2022-03-08 DIAGNOSIS — E669 Obesity, unspecified: Secondary | ICD-10-CM | POA: Diagnosis not present

## 2022-03-08 DIAGNOSIS — I700 Atherosclerosis of aorta: Secondary | ICD-10-CM | POA: Diagnosis not present

## 2022-03-08 DIAGNOSIS — E1169 Type 2 diabetes mellitus with other specified complication: Secondary | ICD-10-CM | POA: Diagnosis not present

## 2022-03-08 DIAGNOSIS — Z96653 Presence of artificial knee joint, bilateral: Secondary | ICD-10-CM | POA: Diagnosis not present

## 2022-03-08 DIAGNOSIS — G35 Multiple sclerosis: Secondary | ICD-10-CM | POA: Diagnosis not present

## 2022-03-08 DIAGNOSIS — J432 Centrilobular emphysema: Secondary | ICD-10-CM | POA: Diagnosis not present

## 2022-03-08 DIAGNOSIS — Z79899 Other long term (current) drug therapy: Secondary | ICD-10-CM | POA: Diagnosis not present

## 2022-03-08 DIAGNOSIS — G720 Drug-induced myopathy: Secondary | ICD-10-CM | POA: Diagnosis not present

## 2022-03-08 DIAGNOSIS — Z471 Aftercare following joint replacement surgery: Secondary | ICD-10-CM | POA: Diagnosis not present

## 2022-03-08 DIAGNOSIS — E039 Hypothyroidism, unspecified: Secondary | ICD-10-CM | POA: Diagnosis not present

## 2022-03-08 DIAGNOSIS — Z7901 Long term (current) use of anticoagulants: Secondary | ICD-10-CM | POA: Diagnosis not present

## 2022-03-08 DIAGNOSIS — E1136 Type 2 diabetes mellitus with diabetic cataract: Secondary | ICD-10-CM | POA: Diagnosis not present

## 2022-03-08 DIAGNOSIS — I10 Essential (primary) hypertension: Secondary | ICD-10-CM | POA: Diagnosis not present

## 2022-03-08 DIAGNOSIS — Z7984 Long term (current) use of oral hypoglycemic drugs: Secondary | ICD-10-CM | POA: Diagnosis not present

## 2022-03-08 DIAGNOSIS — G4700 Insomnia, unspecified: Secondary | ICD-10-CM | POA: Diagnosis not present

## 2022-03-08 DIAGNOSIS — Z9049 Acquired absence of other specified parts of digestive tract: Secondary | ICD-10-CM | POA: Diagnosis not present

## 2022-03-08 DIAGNOSIS — G2581 Restless legs syndrome: Secondary | ICD-10-CM | POA: Diagnosis not present

## 2022-03-08 DIAGNOSIS — E1142 Type 2 diabetes mellitus with diabetic polyneuropathy: Secondary | ICD-10-CM | POA: Diagnosis not present

## 2022-03-08 DIAGNOSIS — E785 Hyperlipidemia, unspecified: Secondary | ICD-10-CM | POA: Diagnosis not present

## 2022-03-08 DIAGNOSIS — D510 Vitamin B12 deficiency anemia due to intrinsic factor deficiency: Secondary | ICD-10-CM | POA: Diagnosis not present

## 2022-03-08 DIAGNOSIS — K2100 Gastro-esophageal reflux disease with esophagitis, without bleeding: Secondary | ICD-10-CM | POA: Diagnosis not present

## 2022-03-08 DIAGNOSIS — M5416 Radiculopathy, lumbar region: Secondary | ICD-10-CM | POA: Diagnosis not present

## 2022-03-08 LAB — TYPE AND SCREEN
ABO/RH(D): O POS
Antibody Screen: POSITIVE
Unit division: 0
Unit division: 0

## 2022-03-08 LAB — BPAM RBC
Blood Product Expiration Date: 202305112359
Blood Product Expiration Date: 202305132359
Unit Type and Rh: 5100
Unit Type and Rh: 5100

## 2022-03-08 NOTE — Anesthesia Postprocedure Evaluation (Signed)
Anesthesia Post Note ? ?Patient: Cassidy Norris ? ?Procedure(s) Performed: COMPUTER ASSISTED TOTAL KNEE ARTHROPLASTY (Right: Knee) ? ?Patient location during evaluation: PACU ?Anesthesia Type: General ?Level of consciousness: awake and alert ?Pain management: pain level controlled ?Vital Signs Assessment: post-procedure vital signs reviewed and stable ?Respiratory status: spontaneous breathing, nonlabored ventilation and respiratory function stable ?Cardiovascular status: blood pressure returned to baseline and stable ?Postop Assessment: no apparent nausea or vomiting ?Anesthetic complications: no ? ? ?No notable events documented. ? ? ?Last Vitals:  ?Vitals:  ? 03/07/22 0801 03/07/22 1120  ?BP: (!) 114/58 120/66  ?Pulse: (!) 106 (!) 110  ?Resp: 16 16  ?Temp: 36.8 ?C 36.7 ?C  ?SpO2: 100% 100%  ?  ?Last Pain:  ?Vitals:  ? 03/07/22 1000  ?TempSrc:   ?PainSc: 0-No pain  ? ? ?  ?  ?  ?  ?  ?  ? ?[place] Ouch ? ? ? ? ?

## 2022-03-09 ENCOUNTER — Encounter: Payer: Self-pay | Admitting: Orthopedic Surgery

## 2022-03-09 DIAGNOSIS — Z20822 Contact with and (suspected) exposure to covid-19: Secondary | ICD-10-CM | POA: Diagnosis not present

## 2022-03-09 NOTE — Progress Notes (Signed)
?[place]  ?[phone]  [phone]  ? ? ? ?Cassidy Norris DOB: 18-Dec-1950  MR#: [id number]  Cassidy Norris#:[id number] ? ?Patient Care Team: ?[name], DO as PCP - General (Family Medicine) ?Christene Lye, MD (General Surgery) ?Forest Gleason, MD (Oncology) ?Pieter Partridge, DO as Consulting Physician (Neurology) ? ?CHIEF COMPLAINT: Recurrent, metastatic ER/PR, HER2 negative invasive carcinoma of the breast.   ? ?INTERVAL HISTORY: Patient returns to clinic today for further evaluation and continuation of Kisqali and fulvestrant.  She underwent right knee replacement approximately 1 week ago and tolerated the procedure well.  She currently feels well and is asymptomatic. She has no new neurologic complaints.  She denies any fevers.  She has a good appetite and denies weight loss.  She has no chest pain, shortness of breath, cough, or hemoptysis.  She denies any nausea, vomiting, or constipation. She has no urinary complaints.  Patient offers no further specific complaints today. ? ?REVIEW OF SYSTEMS:   ?Review of Systems  ?Constitutional: Negative.  Negative for fever, malaise/fatigue and weight loss.  ?Respiratory: Negative.  Negative for cough, hemoptysis and shortness of breath.   ?Cardiovascular: Negative.  Negative for chest pain and leg swelling.  ?Gastrointestinal: Negative.  Negative for abdominal pain.  ?Genitourinary: Negative.  Negative for dysuria.  ?Musculoskeletal: Negative.  Negative for joint pain and myalgias.  ?Skin: Negative.  Negative for rash.  ?Neurological: Negative.  Negative for dizziness, sensory change, focal weakness, weakness and headaches.  ?Psychiatric/Behavioral: Negative.  The patient is not nervous/anxious.   ? ?As per HPI. Otherwise, a complete review of systems is negative. ? ?ONCOLOGY HISTORY: ?Oncology History Overview Note  ?1. Bilateral carcinoma of breast, February, 2015. Right breast status post radical mastectomy. T1 C. N0M0  (isolated tumor cells in one lymph node) multifocal invasive cancer. ?Stage IC, Left breast, Status post radical mastectomy, T2, N1, M0 tumor. ?Stage II, RIght breast. Both tumors are estrogen receptor positive.  Progesterone receptor positive.  HER-2 receptor negative by FISH. ?Negative for BRCA mutation.  ?2. Started on Adriamycin and Cytoxan on March 01, 2014. ?Last cycle of Cytoxan and Adriamycin on May 01, 2014. ?Patient has finished last chemotherapy on September 9. (12 th dose was omitted because of neuropathy) ?3.  Taking tamoxifen.  Patient had a poor tolerance to letrozole.(October, 2015) ?4.  Patient is taking letrozole since November of 2015 ?5.  May, 2016 ?Letrozole has been put on hold because of bony pains ?6.  Started on Aromasin from July of 2016 ? ?  ?History of bilateral breast cancer (Resolved)  ?11/29/2013 Initial Diagnosis  ? Breast cancer bilateral, multifocal ER/PR pos, Her 2 neg,. ? ?  ? ? ?PAST MEDICAL HISTORY: ?Past Medical History:  ?Diagnosis Date  ? Anemia   ? Arthritis not really diagnosis but have pain  ? B12 deficiency 07/23/2016  ? Will check levels to determine if injections are needed again. Await results. Recent CBC at Onc [facility]  ? Blood transfusion without reported diagnosis 1983 had a miscarriage/dnc  ? Cancer ([place]) 12/30/2013  ? Multifocal disease: pT1c,m, N0(isolated tumor cells) Er/ PR positive, Her 2 negative.  ? Cancer ([place]) 01/09/2014  ? Invasive lobular carcinoma, 2.2 cm, T2,N1 ER/PR positive, HER-2/neu negative  ? Cataract 2014?  ? Centrilobular emphysema ([place]) 10/27/2021  ? Complication of anesthesia 03/06/2022  ? Prolonged metabolism of Rocuronium during case.  ? Diabetes mellitus without complication ([place])   ? Diffuse cystic mastopathy   ? Erosive esophagitis   ? Essential  hypertension, benign 09/24/2017  ? Family history of malignant neoplasm of gastrointestinal tract 2012  ? Fractured elbow 08/28/2014  ? Gastroesophageal reflux disease   ? Hx of metabolic acidosis with  increased anion gap   ? Increased heart rate   ? Multiple sclerosis ([place])   ? Neuromuscular disorder ([place])   ? neuropathy in bil feet - left is worse.  ? Obesity, unspecified   ? Peripheral neuropathy   ? Personal history of tobacco use, presenting hazards to health   ? Restless leg syndrome   ? Sleep apnea   ? uses CPAP  ? Special screening for malignant neoplasms, colon   ? ? ?PAST SURGICAL HISTORY: ?Past Surgical History:  ?Procedure Laterality Date  ? BREAST BIOPSY Right 1973,2001  ? BREAST BIOPSY Right 11-07-13  ? INVASIVE MAMMARY CARCINOMA , ER/PR positive Her2 negative  ? BREAST BIOPSY Left 11-27-13  ? invasive lobular and DCIS  ? BREAST SURGERY Bilateral 01/09/14  ? mastectomy  ? CHOLECYSTECTOMY    ? COLONOSCOPY  2010  ? Dr. Jamal Collin  ? COLONOSCOPY WITH PROPOFOL N/A 06/11/2015  ? Procedure: COLONOSCOPY WITH PROPOFOL;  Surgeon: Christene Lye, MD;  Location: [facility] ENDOSCOPY;  Service: Endoscopy;  Laterality: N/A;  ? COLONOSCOPY WITH PROPOFOL N/A 12/23/2021  ? Procedure: COLONOSCOPY WITH PROPOFOL;  Surgeon: Lin Landsman, MD;  Location: [facility] ENDOSCOPY;  Service: Gastroenterology;  Laterality: N/A;  ? DILATATION & CURETTAGE/HYSTEROSCOPY WITH MYOSURE N/A 07/12/2018  ? Procedure: DILATATION & CURETTAGE/HYSTEROSCOPY WITH MYOSURE WITH ENDOMETRIAL POLYPECTOMY;  Surgeon: Will Bonnet, MD;  Location: [facility] ORS;  Service: Gynecology;  Laterality: N/A;  ? [place] OF UTERUS  1983  ? ESOPHAGOGASTRODUODENOSCOPY N/A 12/23/2021  ? Procedure: ESOPHAGOGASTRODUODENOSCOPY (EGD);  Surgeon: Lin Landsman, MD;  Location: [facility] ENDOSCOPY;  Service: Gastroenterology;  Laterality: N/A;  ? JOINT REPLACEMENT  11/23/2015  ? KNEE ARTHROPLASTY Right 03/06/2022  ? Procedure: COMPUTER ASSISTED TOTAL KNEE ARTHROPLASTY;  Surgeon: Dereck Leep, MD;  Location: [facility] ORS;  Service: Orthopedics;  Laterality: Right;  ? POLYPECTOMY  1989  ? PORTA CATH REMOVAL    ? PORTACATH PLACEMENT  2015  ? SPINE SURGERY  09/14/14  ?  Ruptured disk L4- L5  ? TOTAL KNEE ARTHROPLASTY Left 11/22/2015  ? Procedure: TOTAL KNEE ARTHROPLASTY;  Surgeon: Earlie Server, MD;  Location: [facility];  Service: Orthopedics;  Laterality: Left;  ? TUBAL LIGATION    ? [place] EXTRACTION  2006  ? ? ?FAMILY HISTORY ?Family History  ?Problem Relation Age of Onset  ? Cancer Mother   ?     lung  ? COPD Mother   ? Cancer Father   ?     colon, stomach  ? Diabetes Maternal Grandmother   ? Hypertension Sister   ? Rectal cancer Paternal Uncle   ? Rectal cancer Paternal Uncle   ? ? ?GYNECOLOGIC HISTORY:  Patient's last menstrual period was 12/22/1999 (approximate).  ? ?  ?ADVANCED DIRECTIVES:  ? ? ?HEALTH MAINTENANCE: ?Social History  ? ?Tobacco Use  ? Smoking status: Former  ?  Packs/day: 1.00  ?  Years: 30.00  ?  Pack years: 30.00  ?  Types: Cigarettes  ?  Quit date: 12/11/2013  ?  Years since quitting: 8.2  ? Smokeless tobacco: Former  ?Vaping Use  ? Vaping Use: Never used  ?Substance Use Topics  ? Alcohol use: Not Currently  ? Drug use: No  ? ? ? Colonoscopy: ? PAP: ? Bone density: ? Mammogram: ? ?No Known Allergies ? ?  Current Outpatient Medications  ?Medication Sig Dispense Refill  ? acetaminophen (TYLENOL) 500 MG tablet Take 1,000 mg by mouth 2 (two) times daily as needed for moderate pain or headache.    ? baclofen (LIORESAL) 10 MG tablet Take 1 tablet (10 mg total) by mouth 3 (three) times daily as needed for muscle spasms. (Patient taking differently: Take 10 mg by mouth 2 (two) times daily.) 1080 tablet 0  ? calcium carbonate (OSCAL) 1500 (600 Ca) MG TABS tablet Take by mouth 2 (two) times daily with a meal.    ? celecoxib (CELEBREX) 200 MG capsule Take 1 capsule (200 mg total) by mouth 2 (two) times daily. 15 capsule 0  ? enoxaparin (LOVENOX) 30 MG/0.3ML injection Inject 0.3 mLs (30 mg total) into the skin every 12 (twelve) hours for 14 days. 5.6 mL 0  ? famotidine (PEPCID) 20 MG tablet TAKE 1 TABLET BY MOUTH TWICE A DAY BEFORE MEALS 180 tablet 1  ? fluticasone  (FLONASE) 50 MCG/ACT nasal spray PLACE 2 SPRAYS INTO BOTH NOSTRILS AT BEDTIME. 48 g 1  ? fulvestrant (FASLODEX) 250 MG/5ML injection Inject 500 mg into the muscle every 30 (thirty) days. One injection e

## 2022-03-10 DIAGNOSIS — Z96653 Presence of artificial knee joint, bilateral: Secondary | ICD-10-CM | POA: Diagnosis not present

## 2022-03-10 DIAGNOSIS — M5416 Radiculopathy, lumbar region: Secondary | ICD-10-CM | POA: Diagnosis not present

## 2022-03-10 DIAGNOSIS — Z471 Aftercare following joint replacement surgery: Secondary | ICD-10-CM | POA: Diagnosis not present

## 2022-03-10 DIAGNOSIS — I10 Essential (primary) hypertension: Secondary | ICD-10-CM | POA: Diagnosis not present

## 2022-03-10 DIAGNOSIS — J432 Centrilobular emphysema: Secondary | ICD-10-CM | POA: Diagnosis not present

## 2022-03-10 DIAGNOSIS — E1142 Type 2 diabetes mellitus with diabetic polyneuropathy: Secondary | ICD-10-CM | POA: Diagnosis not present

## 2022-03-11 DIAGNOSIS — E1142 Type 2 diabetes mellitus with diabetic polyneuropathy: Secondary | ICD-10-CM | POA: Diagnosis not present

## 2022-03-11 DIAGNOSIS — Z471 Aftercare following joint replacement surgery: Secondary | ICD-10-CM | POA: Diagnosis not present

## 2022-03-11 DIAGNOSIS — J432 Centrilobular emphysema: Secondary | ICD-10-CM | POA: Diagnosis not present

## 2022-03-11 DIAGNOSIS — I10 Essential (primary) hypertension: Secondary | ICD-10-CM | POA: Diagnosis not present

## 2022-03-11 DIAGNOSIS — Z96653 Presence of artificial knee joint, bilateral: Secondary | ICD-10-CM | POA: Diagnosis not present

## 2022-03-11 DIAGNOSIS — M5416 Radiculopathy, lumbar region: Secondary | ICD-10-CM | POA: Diagnosis not present

## 2022-03-12 ENCOUNTER — Inpatient Hospital Stay: Payer: Medicare Other

## 2022-03-12 ENCOUNTER — Inpatient Hospital Stay (HOSPITAL_BASED_OUTPATIENT_CLINIC_OR_DEPARTMENT_OTHER): Payer: Medicare Other | Admitting: Oncology

## 2022-03-12 ENCOUNTER — Inpatient Hospital Stay: Payer: Medicare Other | Attending: Oncology

## 2022-03-12 ENCOUNTER — Encounter: Payer: Self-pay | Admitting: Oncology

## 2022-03-12 ENCOUNTER — Inpatient Hospital Stay: Payer: Medicare Other | Admitting: Pharmacist

## 2022-03-12 VITALS — BP 110/70 | HR 111 | Temp 97.9°F | Resp 16 | Ht 64.0 in | Wt 190.0 lb

## 2022-03-12 DIAGNOSIS — C50911 Malignant neoplasm of unspecified site of right female breast: Secondary | ICD-10-CM | POA: Diagnosis not present

## 2022-03-12 DIAGNOSIS — Z170 Estrogen receptor positive status [ER+]: Secondary | ICD-10-CM | POA: Diagnosis not present

## 2022-03-12 DIAGNOSIS — Z87891 Personal history of nicotine dependence: Secondary | ICD-10-CM | POA: Diagnosis not present

## 2022-03-12 DIAGNOSIS — Z79811 Long term (current) use of aromatase inhibitors: Secondary | ICD-10-CM | POA: Diagnosis not present

## 2022-03-12 DIAGNOSIS — M8580 Other specified disorders of bone density and structure, unspecified site: Secondary | ICD-10-CM | POA: Diagnosis not present

## 2022-03-12 DIAGNOSIS — D649 Anemia, unspecified: Secondary | ICD-10-CM | POA: Diagnosis not present

## 2022-03-12 DIAGNOSIS — G35 Multiple sclerosis: Secondary | ICD-10-CM | POA: Diagnosis not present

## 2022-03-12 DIAGNOSIS — C50919 Malignant neoplasm of unspecified site of unspecified female breast: Secondary | ICD-10-CM

## 2022-03-12 DIAGNOSIS — C50912 Malignant neoplasm of unspecified site of left female breast: Secondary | ICD-10-CM | POA: Diagnosis not present

## 2022-03-12 DIAGNOSIS — Z7984 Long term (current) use of oral hypoglycemic drugs: Secondary | ICD-10-CM | POA: Insufficient documentation

## 2022-03-12 DIAGNOSIS — Z5111 Encounter for antineoplastic chemotherapy: Secondary | ICD-10-CM | POA: Diagnosis not present

## 2022-03-12 DIAGNOSIS — Z79899 Other long term (current) drug therapy: Secondary | ICD-10-CM | POA: Insufficient documentation

## 2022-03-12 DIAGNOSIS — N289 Disorder of kidney and ureter, unspecified: Secondary | ICD-10-CM | POA: Diagnosis not present

## 2022-03-12 LAB — CBC WITH DIFFERENTIAL/PLATELET
Abs Immature Granulocytes: 0.13 10*3/uL — ABNORMAL HIGH (ref 0.00–0.07)
Basophils Absolute: 0.1 10*3/uL (ref 0.0–0.1)
Basophils Relative: 1 %
Eosinophils Absolute: 0.4 10*3/uL (ref 0.0–0.5)
Eosinophils Relative: 4 %
HCT: 30.2 % — ABNORMAL LOW (ref 36.0–46.0)
Hemoglobin: 10.1 g/dL — ABNORMAL LOW (ref 12.0–15.0)
Immature Granulocytes: 1 %
Lymphocytes Relative: 16 %
Lymphs Abs: 1.8 10*3/uL (ref 0.7–4.0)
MCH: 33.1 pg (ref 26.0–34.0)
MCHC: 33.4 g/dL (ref 30.0–36.0)
MCV: 99 fL (ref 80.0–100.0)
Monocytes Absolute: 1.3 10*3/uL — ABNORMAL HIGH (ref 0.1–1.0)
Monocytes Relative: 11 %
Neutro Abs: 7.5 10*3/uL (ref 1.7–7.7)
Neutrophils Relative %: 67 %
Platelets: 312 10*3/uL (ref 150–400)
RBC: 3.05 MIL/uL — ABNORMAL LOW (ref 3.87–5.11)
RDW: 16.7 % — ABNORMAL HIGH (ref 11.5–15.5)
WBC: 11.1 10*3/uL — ABNORMAL HIGH (ref 4.0–10.5)
nRBC: 0 % (ref 0.0–0.2)

## 2022-03-12 LAB — COMPREHENSIVE METABOLIC PANEL
ALT: 37 U/L (ref 0–44)
AST: 44 U/L — ABNORMAL HIGH (ref 15–41)
Albumin: 4 g/dL (ref 3.5–5.0)
Alkaline Phosphatase: 64 U/L (ref 38–126)
Anion gap: 9 (ref 5–15)
BUN: 20 mg/dL (ref 8–23)
CO2: 25 mmol/L (ref 22–32)
Calcium: 9 mg/dL (ref 8.9–10.3)
Chloride: 103 mmol/L (ref 98–111)
Creatinine, Ser: 1.25 mg/dL — ABNORMAL HIGH (ref 0.44–1.00)
GFR, Estimated: 47 mL/min — ABNORMAL LOW (ref >60)
Glucose, Bld: 127 mg/dL — ABNORMAL HIGH (ref 70–99)
Potassium: 3.9 mmol/L (ref 3.5–5.1)
Sodium: 137 mmol/L (ref 135–145)
Total Bilirubin: 0.6 mg/dL (ref 0.3–1.2)
Total Protein: 7.3 g/dL (ref 6.5–8.1)

## 2022-03-12 LAB — MAGNESIUM: Magnesium: 1.7 mg/dL (ref 1.7–2.4)

## 2022-03-12 MED ORDER — FULVESTRANT 250 MG/5ML IM SOSY
500.0000 mg | PREFILLED_SYRINGE | Freq: Once | INTRAMUSCULAR | Status: AC
  Administered 2022-03-12: 500 mg via INTRAMUSCULAR
  Filled 2022-03-12: qty 10

## 2022-03-12 MED ORDER — PROCHLORPERAZINE MALEATE 10 MG PO TABS: 10.0000 mg | ORAL_TABLET | Freq: Four times a day (QID) | ORAL | 1 refills | Status: DC | PRN

## 2022-03-12 NOTE — Progress Notes (Signed)
? ?Oral Chemotherapy Clinic ?[place]  ?[phone]) [phone] [phone] ? ?Patient Care Team: ?[name], DO as PCP - General (Family Medicine) ?Christene Lye, MD (General Surgery) ?Forest Gleason, MD (Oncology) ?Pieter Partridge, DO as Consulting Physician (Neurology)  ? ?Name of the patient: Cassidy Norris  ?[id number]  ?1951/08/04  ? ?Date of visit: 03/12/22 ? ?HPI: Patient is a 71 y.o. female with recurrent breast cancer, ER/PR positive/HER2 negative. Currently treating with Kisqali (ribociclib) and fulvestrant. She is starting Cycle 3 today (03/12/22). ? ?Reason for Consult: Oral chemotherapy follow-up for ribociclib therapy. ? ? ?PAST MEDICAL HISTORY: ?Past Medical History:  ?Diagnosis Date  ? Anemia   ? Arthritis not really diagnosis but have pain  ? B12 deficiency 07/23/2016  ? Will check levels to determine if injections are needed again. Await results. Recent CBC at Onc WNL  ? Blood transfusion without reported diagnosis 1983 had a miscarriage/dnc  ? Cancer ([place]) 12/30/2013  ? Multifocal disease: pT1c,m, N0(isolated tumor cells) Er/ PR positive, Her 2 negative.  ? Cancer ([place]) 01/09/2014  ? Invasive lobular carcinoma, 2.2 cm, T2,N1 ER/PR positive, HER-2/neu negative  ? Cataract 2014?  ? Centrilobular emphysema ([place]) 10/27/2021  ? Complication of anesthesia 03/06/2022  ? Prolonged metabolism of Rocuronium during case.  ? Diabetes mellitus without complication ([place])   ? Diffuse cystic mastopathy   ? Erosive esophagitis   ? Essential hypertension, benign 09/24/2017  ? Family history of malignant neoplasm of gastrointestinal tract 2012  ? Fractured elbow 08/28/2014  ? Gastroesophageal reflux disease   ? Hx of metabolic acidosis with increased anion gap   ? Increased heart rate   ? Multiple sclerosis ([place])   ? Neuromuscular disorder ([place])   ? neuropathy in bil feet - left is worse.  ? Obesity, unspecified   ? Peripheral neuropathy   ? Personal history of tobacco  use, presenting hazards to health   ? Restless leg syndrome   ? Sleep apnea   ? uses CPAP  ? Special screening for malignant neoplasms, colon   ? ? ?HEMATOLOGY/ONCOLOGY HISTORY:  ?Oncology History Overview Note  ?1. Bilateral carcinoma of breast, February, 2015. Right breast status post radical mastectomy. T1 C. N0M0 (isolated tumor cells in one lymph node) multifocal invasive cancer. ?Stage IC, Left breast, Status post radical mastectomy, T2, N1, M0 tumor. ?Stage II, RIght breast. Both tumors are estrogen receptor positive.  Progesterone receptor positive.  HER-2 receptor negative by FISH. ?Negative for BRCA mutation.  ?2. Started on Adriamycin and Cytoxan on March 01, 2014. ?Last cycle of Cytoxan and Adriamycin on May 01, 2014. ?Patient has finished last chemotherapy on September 9. (12 th dose was omitted because of neuropathy) ?3.  Taking tamoxifen.  Patient had a poor tolerance to letrozole.(October, 2015) ?4.  Patient is taking letrozole since November of 2015 ?5.  May, 2016 ?Letrozole has been put on hold because of bony pains ?6.  Started on Aromasin from July of 2016 ? ?  ?History of bilateral breast cancer (Resolved)  ?11/29/2013 Initial Diagnosis  ? Breast cancer bilateral, multifocal ER/PR pos, Her 2 neg,. ? ?  ? ? ?ALLERGIES:  has No Known Allergies. ? ?MEDICATIONS:  ?Current Outpatient Medications  ?Medication Sig Dispense Refill  ? acetaminophen (TYLENOL) 500 MG tablet Take 1,000 mg by mouth 2 (two) times daily as needed for moderate pain or headache.    ? baclofen (LIORESAL) 10 MG tablet Take 1 tablet (10 mg total) by mouth 3 (three) times daily as  needed for muscle spasms. (Patient taking differently: Take 10 mg by mouth 2 (two) times daily.) 1080 tablet 0  ? calcium carbonate (OSCAL) 1500 (600 Ca) MG TABS tablet Take by mouth 2 (two) times daily with a meal.    ? celecoxib (CELEBREX) 200 MG capsule Take 1 capsule (200 mg total) by mouth 2 (two) times daily. 15 capsule 0  ? diclofenac Sodium (VOLTAREN)  1 % GEL Apply 2 g topically 3 (three) times daily as needed. (Patient not taking: Reported on 03/12/2022) 100 g 2  ? enoxaparin (LOVENOX) 30 MG/0.3ML injection Inject 0.3 mLs (30 mg total) into the skin every 12 (twelve) hours for 14 days. 5.6 mL 0  ? famotidine (PEPCID) 20 MG tablet TAKE 1 TABLET BY MOUTH TWICE A DAY BEFORE MEALS 180 tablet 1  ? fluticasone (FLONASE) 50 MCG/ACT nasal spray PLACE 2 SPRAYS INTO BOTH NOSTRILS AT BEDTIME. 48 g 1  ? fulvestrant (FASLODEX) 250 MG/5ML injection Inject 500 mg into the muscle every 30 (thirty) days. One injection each buttock over 1-2 minutes. Warm prior to use.    ? Krill Oil 1000 MG CAPS Take 1,000 mg by mouth daily.    ? lisinopril (ZESTRIL) 5 MG tablet TAKE 1 TABLET BY MOUTH EVERY DAY IN THE EVENING 90 tablet 3  ? loperamide (IMODIUM A-D) 2 MG tablet Take 2 tablets (4 mg total) by mouth 4 (four) times daily as needed for diarrhea or loose stools. 30 tablet 0  ? metFORMIN (GLUCOPHAGE) 500 MG tablet TAKE 1 TABLET BY MOUTH 2 TIMES DAILY WITH A MEAL. 180 tablet 2  ? omeprazole (PRILOSEC) 40 MG capsule TAKE 1 CAPSULE (40 MG TOTAL) BY MOUTH 2 (TWO) TIMES DAILY BEFORE A MEAL. 180 capsule 1  ? oxyCODONE (OXY IR/ROXICODONE) 5 MG immediate release tablet Take 1 tablet (5 mg total) by mouth every 4 (four) hours as needed for moderate pain (pain score 4-6). 30 tablet 0  ? polyethylene glycol powder (GLYCOLAX/MIRALAX) powder Take 17 g by mouth 2 (two) times daily as needed for moderate constipation. 3350 g 1  ? prochlorperazine (COMPAZINE) 10 MG tablet Take 1 tablet (10 mg total) by mouth every 6 (six) hours as needed for nausea or vomiting. 30 tablet 0  ? ribociclib succ (KISQALI, 600 MG DOSE,) 200 MG Therapy Pack Take 3 tablets (600 mg total) by mouth daily. Take for 21 days on, 7 days off, repeat every 28 days. 63 tablet 0  ? senna-docusate (SENOKOT-S) 8.6-50 MG tablet Take 1 tablet by mouth 2 (two) times daily. (Patient not taking: Reported on 03/12/2022)    ? sodium chloride  (OCEAN) 0.65 % SOLN nasal spray Place 1 spray into both nostrils as needed for congestion.    ? traMADol (ULTRAM) 50 MG tablet Take 1-2 tablets (50-100 mg total) by mouth every 4 (four) hours as needed for moderate pain. 30 tablet 0  ? traZODone (DESYREL) 50 MG tablet Take 1 tablet (50 mg total) by mouth at bedtime as needed for sleep. 30 tablet 3  ? venlafaxine XR (EFFEXOR-XR) 75 MG 24 hr capsule Take 1 capsule (75 mg total) by mouth daily with breakfast. 360 capsule 0  ? vitamin B-12 (CYANOCOBALAMIN) 1000 MCG tablet Take 2,500 mcg by mouth every Monday, Wednesday, and Friday.    ? Vitamin D, Cholecalciferol, 50 MCG (2000 UT) CAPS Take 2,000 Units by mouth every Monday, Wednesday, and Friday.    ? ?No current facility-administered medications for this visit.  ? ?Facility-Administered Medications Ordered in Other Visits  ?  Medication Dose Route Frequency Provider Last Rate Last Admin  ? fulvestrant (FASLODEX) injection 500 mg  500 mg Intramuscular Once Lloyd Huger, MD      ? ? ?VITAL SIGNS: ?LMP 12/22/1999 (Approximate)  ?There were no vitals filed for this visit.  ?Estimated body mass index is 32.61 kg/m? as calculated from the following: ?  Height as of an earlier encounter on 03/12/22: _0  (1.626 m). ?  Weight as of an earlier encounter on 03/12/22: 86.2 kg (190 lb). ? ?LABS: ?CBC: ?   ?Component Value Date/Time  ? WBC 11.1 (H) 03/12/2022 0955  ? HGB 10.1 (L) 03/12/2022 0955  ? HGB 15.5 07/13/2017 0937  ? HCT 30.2 (L) 03/12/2022 0955  ? HCT 46.7 (H) 07/13/2017 9166  ? PLT 312 03/12/2022 0955  ? PLT 211 07/13/2017 0937  ? MCV 99.0 03/12/2022 0955  ? MCV 94 07/13/2017 0937  ? MCV 93 03/11/2015 1356  ? NEUTROABS 7.5 03/12/2022 0955  ? NEUTROABS 5.4 07/13/2017 0937  ? NEUTROABS 5.6 03/11/2015 1356  ? LYMPHSABS 1.8 03/12/2022 0955  ? LYMPHSABS 2.1 07/13/2017 0937  ? LYMPHSABS 2.3 03/11/2015 1356  ? MONOABS 1.3 (H) 03/12/2022 0955  ? MONOABS 1.0 (H) 03/11/2015 1356  ? EOSABS 0.4 03/12/2022 0955  ? EOSABS 0.2  07/13/2017 0937  ? EOSABS 0.3 03/11/2015 1356  ? BASOSABS 0.1 03/12/2022 0955  ? BASOSABS 0.0 07/13/2017 0937  ? BASOSABS 0.1 03/11/2015 1356  ? ?Comprehensive Metabolic Panel: ?   ?Component Value Date/Time

## 2022-03-13 DIAGNOSIS — Z471 Aftercare following joint replacement surgery: Secondary | ICD-10-CM | POA: Diagnosis not present

## 2022-03-13 DIAGNOSIS — I10 Essential (primary) hypertension: Secondary | ICD-10-CM | POA: Diagnosis not present

## 2022-03-13 DIAGNOSIS — E1142 Type 2 diabetes mellitus with diabetic polyneuropathy: Secondary | ICD-10-CM | POA: Diagnosis not present

## 2022-03-13 DIAGNOSIS — M5416 Radiculopathy, lumbar region: Secondary | ICD-10-CM | POA: Diagnosis not present

## 2022-03-13 DIAGNOSIS — J432 Centrilobular emphysema: Secondary | ICD-10-CM | POA: Diagnosis not present

## 2022-03-13 DIAGNOSIS — Z96653 Presence of artificial knee joint, bilateral: Secondary | ICD-10-CM | POA: Diagnosis not present

## 2022-03-13 LAB — CANCER ANTIGEN 27.29: CA 27.29: 155.1 U/mL — ABNORMAL HIGH (ref 0.0–38.6)

## 2022-03-16 DIAGNOSIS — Z96653 Presence of artificial knee joint, bilateral: Secondary | ICD-10-CM | POA: Diagnosis not present

## 2022-03-16 DIAGNOSIS — J432 Centrilobular emphysema: Secondary | ICD-10-CM | POA: Diagnosis not present

## 2022-03-16 DIAGNOSIS — Z471 Aftercare following joint replacement surgery: Secondary | ICD-10-CM | POA: Diagnosis not present

## 2022-03-16 DIAGNOSIS — E1142 Type 2 diabetes mellitus with diabetic polyneuropathy: Secondary | ICD-10-CM | POA: Diagnosis not present

## 2022-03-16 DIAGNOSIS — M5416 Radiculopathy, lumbar region: Secondary | ICD-10-CM | POA: Diagnosis not present

## 2022-03-16 DIAGNOSIS — I10 Essential (primary) hypertension: Secondary | ICD-10-CM | POA: Diagnosis not present

## 2022-03-17 DIAGNOSIS — Z20822 Contact with and (suspected) exposure to covid-19: Secondary | ICD-10-CM | POA: Diagnosis not present

## 2022-03-18 DIAGNOSIS — M5416 Radiculopathy, lumbar region: Secondary | ICD-10-CM | POA: Diagnosis not present

## 2022-03-18 DIAGNOSIS — Z471 Aftercare following joint replacement surgery: Secondary | ICD-10-CM | POA: Diagnosis not present

## 2022-03-18 DIAGNOSIS — I10 Essential (primary) hypertension: Secondary | ICD-10-CM | POA: Diagnosis not present

## 2022-03-18 DIAGNOSIS — Z96653 Presence of artificial knee joint, bilateral: Secondary | ICD-10-CM | POA: Diagnosis not present

## 2022-03-18 DIAGNOSIS — E1142 Type 2 diabetes mellitus with diabetic polyneuropathy: Secondary | ICD-10-CM | POA: Diagnosis not present

## 2022-03-18 DIAGNOSIS — J432 Centrilobular emphysema: Secondary | ICD-10-CM | POA: Diagnosis not present

## 2022-03-19 DIAGNOSIS — J432 Centrilobular emphysema: Secondary | ICD-10-CM | POA: Diagnosis not present

## 2022-03-19 DIAGNOSIS — Z96653 Presence of artificial knee joint, bilateral: Secondary | ICD-10-CM | POA: Diagnosis not present

## 2022-03-19 DIAGNOSIS — Z471 Aftercare following joint replacement surgery: Secondary | ICD-10-CM | POA: Diagnosis not present

## 2022-03-19 DIAGNOSIS — M5416 Radiculopathy, lumbar region: Secondary | ICD-10-CM | POA: Diagnosis not present

## 2022-03-19 DIAGNOSIS — I10 Essential (primary) hypertension: Secondary | ICD-10-CM | POA: Diagnosis not present

## 2022-03-19 DIAGNOSIS — E1142 Type 2 diabetes mellitus with diabetic polyneuropathy: Secondary | ICD-10-CM | POA: Diagnosis not present

## 2022-03-20 ENCOUNTER — Other Ambulatory Visit (HOSPITAL_COMMUNITY): Payer: Self-pay

## 2022-03-20 DIAGNOSIS — Z96651 Presence of right artificial knee joint: Secondary | ICD-10-CM | POA: Diagnosis not present

## 2022-03-24 DIAGNOSIS — Z96651 Presence of right artificial knee joint: Secondary | ICD-10-CM | POA: Diagnosis not present

## 2022-03-27 DIAGNOSIS — Z96651 Presence of right artificial knee joint: Secondary | ICD-10-CM | POA: Diagnosis not present

## 2022-03-27 DIAGNOSIS — M25661 Stiffness of right knee, not elsewhere classified: Secondary | ICD-10-CM | POA: Diagnosis not present

## 2022-03-29 ENCOUNTER — Other Ambulatory Visit: Payer: Self-pay | Admitting: Family Medicine

## 2022-03-29 DIAGNOSIS — G4700 Insomnia, unspecified: Secondary | ICD-10-CM

## 2022-03-30 DIAGNOSIS — Z96651 Presence of right artificial knee joint: Secondary | ICD-10-CM | POA: Diagnosis not present

## 2022-03-30 NOTE — Telephone Encounter (Signed)
Requested Prescriptions  ?Pending Prescriptions Disp Refills  ?? traZODone (DESYREL) 50 MG tablet [Pharmacy Med Name: TRAZODONE 50 MG TABLET] 90 tablet 2  ?  Sig: TAKE 1 TABLET BY MOUTH AT BEDTIME AS NEEDED FOR SLEEP.  ?  ? Psychiatry: Antidepressants - Serotonin Modulator Passed - 03/29/2022 12:32 PM  ?  ?  Passed - Valid encounter within last 6 months  ?  Recent Outpatient Visits   ?      ? 5 months ago Annual physical exam  ? [place], DO  ? 11 months ago Chest wall pain  ? [place], DO  ? 1 year ago Type 2 diabetes mellitus with other specified complication, without long-term current use of insulin ([place])  ? [place], DO  ? 1 year ago Type 2 diabetes mellitus with other specified complication, without long-term current use of insulin ([place])  ? [place], DO  ? 2 years ago Primary insomnia  ? [place], DO  ?  ?  ?Future Appointments   ?        ? In 4 weeks [name], [name], DO [facility], [place]  ? In 4 months [name], MD [place]  ?  ? ?  ?  ?  ? ? ?

## 2022-04-02 DIAGNOSIS — Z96651 Presence of right artificial knee joint: Secondary | ICD-10-CM | POA: Diagnosis not present

## 2022-04-05 NOTE — Progress Notes (Signed)
[facility]  [phone]  [phone]     Cassidy Norris DOB: 08/07/1951  MR#: [id number]  CSN#:[id number]  Patient Care Team: Karamalegos, Alexander J, DO as PCP - General (Family Medicine) Sankar, Seeplaputhur G, MD (General Surgery) Choksi, Janak, MD (Oncology) Jaffe, Adam R, DO as Consulting Physician (Neurology) Finnegan, Timothy J, MD as Consulting Physician (Oncology)  CHIEF COMPLAINT: Recurrent, metastatic ER/PR, HER2 negative invasive carcinoma of the breast.    INTERVAL HISTORY: Patient returns to clinic today for further evaluation and continuation of Kisqali and fulvestrant.  She currently feels well and is asymptomatic.  She is tolerating her treatments without significant side effects. She currently feels well and is asymptomatic. She has no new neurologic complaints.  She denies any fevers.  She has a good appetite and denies weight loss.  She has no chest pain, shortness of breath, cough, or hemoptysis.  She denies any nausea, vomiting, or constipation. She has no urinary complaints.  Patient offers no specific complaints today.  REVIEW OF SYSTEMS:   Review of Systems  Constitutional: Negative.  Negative for fever, malaise/fatigue and weight loss.  Respiratory: Negative.  Negative for cough, hemoptysis and shortness of breath.   Cardiovascular: Negative.  Negative for chest pain and leg swelling.  Gastrointestinal: Negative.  Negative for abdominal pain.  Genitourinary: Negative.  Negative for dysuria.  Musculoskeletal: Negative.  Negative for joint pain and myalgias.  Skin: Negative.  Negative for rash.  Neurological: Negative.  Negative for dizziness, sensory change, focal weakness, weakness and headaches.  Psychiatric/Behavioral: Negative.  The patient is not nervous/anxious.    As per HPI. Otherwise, a complete review of systems is negative.  ONCOLOGY HISTORY: Oncology History Overview Note  1. Bilateral carcinoma of breast,  February, 2015. Right breast status post radical mastectomy. T1 C. N0M0 (isolated tumor cells in one lymph node) multifocal invasive cancer. Stage IC, Left breast, Status post radical mastectomy, T2, N1, M0 tumor. Stage II, RIght breast. Both tumors are estrogen receptor positive.  Progesterone receptor positive.  HER-2 receptor negative by FISH. Negative for BRCA mutation.  2. Started on Adriamycin and Cytoxan on March 01, 2014. Last cycle of Cytoxan and Adriamycin on May 01, 2014. Patient has finished last chemotherapy on September 9. (12 th dose was omitted because of neuropathy) 3.  Taking tamoxifen.  Patient had a poor tolerance to letrozole.(October, 2015) 4.  Patient is taking letrozole since November of 2015 5.  May, 2016 Letrozole has been put on hold because of bony pains 6.  Started on Aromasin from July of 2016    History of bilateral breast cancer (Resolved)  11/29/2013 Initial Diagnosis   Breast cancer bilateral, multifocal ER/PR pos, Her 2 neg,.      PAST MEDICAL HISTORY: Past Medical History:  Diagnosis Date   Anemia    Arthritis not really diagnosis but have pain   B12 deficiency 07/23/2016   Will check levels to determine if injections are needed again. Await results. Recent CBC at Onc WNL   Blood transfusion without reported diagnosis 1983 had a miscarriage/dnc   Cancer (HCC) 12/30/2013   Multifocal disease: pT1c,m, N0(isolated tumor cells) Er/ PR positive, Her 2 negative.   Cancer (HCC) 01/09/2014   Invasive lobular carcinoma, 2.2 cm, T2,N1 ER/PR positive, HER-2/neu negative   Cataract 2014?   Centrilobular emphysema (HCC) 10/27/2021   Complication of anesthesia 03/06/2022   Prolonged metabolism of Rocuronium during case.   Diabetes mellitus without complication (HCC)    Diffuse cystic mastopathy      Erosive esophagitis    Essential hypertension, benign 09/24/2017   Family history of malignant neoplasm of gastrointestinal tract 2012   Fractured elbow  08/28/2014   Gastroesophageal reflux disease    Hx of metabolic acidosis with increased anion gap    Increased heart rate    Multiple sclerosis (HCC)    Neuromuscular disorder (HCC)    neuropathy in bil feet - left is worse.   Obesity, unspecified    Peripheral neuropathy    Personal history of tobacco use, presenting hazards to health    Restless leg syndrome    Sleep apnea    uses CPAP   Special screening for malignant neoplasms, colon     PAST SURGICAL HISTORY: Past Surgical History:  Procedure Laterality Date   BREAST BIOPSY Right 1973,2001   BREAST BIOPSY Right 11-07-13   INVASIVE MAMMARY CARCINOMA , ER/PR positive Her2 negative   BREAST BIOPSY Left 11-27-13   invasive lobular and DCIS   BREAST SURGERY Bilateral 01/09/14   mastectomy   CHOLECYSTECTOMY     COLONOSCOPY  2010   Dr. Sankar   COLONOSCOPY WITH PROPOFOL N/A 06/11/2015   Procedure: COLONOSCOPY WITH PROPOFOL;  Surgeon: Seeplaputhur G Sankar, MD;  Location: [facility];  Service: Endoscopy;  Laterality: N/A;   COLONOSCOPY WITH PROPOFOL N/A 12/23/2021   Procedure: COLONOSCOPY WITH PROPOFOL;  Surgeon: Vanga, Rohini Reddy, MD;  Location: [facility];  Service: Gastroenterology;  Laterality: N/A;   DILATATION & CURETTAGE/HYSTEROSCOPY WITH MYOSURE N/A 07/12/2018   Procedure: DILATATION & CURETTAGE/HYSTEROSCOPY WITH MYOSURE WITH ENDOMETRIAL POLYPECTOMY;  Surgeon: Jackson, Stephen D, MD;  Location: [facility];  Service: Gynecology;  Laterality: N/A;   DILATION AND CURETTAGE OF UTERUS  1983   ESOPHAGOGASTRODUODENOSCOPY N/A 12/23/2021   Procedure: ESOPHAGOGASTRODUODENOSCOPY (EGD);  Surgeon: Vanga, Rohini Reddy, MD;  Location: [facility];  Service: Gastroenterology;  Laterality: N/A;   JOINT REPLACEMENT  11/23/2015   KNEE ARTHROPLASTY Right 03/06/2022   Procedure: COMPUTER ASSISTED TOTAL KNEE ARTHROPLASTY;  Surgeon: Hooten, James P, MD;  Location: [facility];  Service: Orthopedics;  Laterality: Right;   POLYPECTOMY  1989    PORTA CATH REMOVAL     PORTACATH PLACEMENT  2015   SPINE SURGERY  09/14/14   Ruptured disk L4- L5   TOTAL KNEE ARTHROPLASTY Left 11/22/2015   Procedure: TOTAL KNEE ARTHROPLASTY;  Surgeon: Daniel Caffrey, MD;  Location: [facility];  Service: Orthopedics;  Laterality: Left;   TUBAL LIGATION     WISDOM TOOTH EXTRACTION  2006    FAMILY HISTORY Family History  Problem Relation Age of Onset   Cancer Mother        lung   COPD Mother    Cancer Father        colon, stomach   Diabetes Maternal Grandmother    Hypertension Sister    Rectal cancer Paternal Uncle    Rectal cancer Paternal Uncle     GYNECOLOGIC HISTORY:  Patient's last menstrual period was 12/22/1999 (approximate).     ADVANCED DIRECTIVES:    HEALTH MAINTENANCE: Social History   Tobacco Use   Smoking status: Former    Packs/day: 1.00    Years: 30.00    Pack years: 30.00    Types: Cigarettes    Quit date: 12/11/2013    Years since quitting: 8.3   Smokeless tobacco: Former  Vaping Use   Vaping Use: Never used  Substance Use Topics   Alcohol use: Not Currently   Drug use: No     Colonoscopy:  PAP:  Bone density:    Mammogram:  No Known Allergies  Current Outpatient Medications  Medication Sig Dispense Refill   acetaminophen (TYLENOL) 500 MG tablet Take 1,000 mg by mouth 2 (two) times daily as needed for moderate pain or headache.     aspirin 81 MG chewable tablet Chew by mouth daily.     baclofen (LIORESAL) 10 MG tablet Take 1 tablet (10 mg total) by mouth 3 (three) times daily as needed for muscle spasms. (Patient taking differently: Take 10 mg by mouth 2 (two) times daily.) 1080 tablet 0   calcium carbonate (OSCAL) 1500 (600 Ca) MG TABS tablet Take by mouth 2 (two) times daily with a meal.     celecoxib (CELEBREX) 200 MG capsule Take 1 capsule (200 mg total) by mouth 2 (two) times daily. 15 capsule 0   famotidine (PEPCID) 20 MG tablet TAKE 1 TABLET BY MOUTH TWICE A DAY BEFORE MEALS 180 tablet 1   fluticasone  (FLONASE) 50 MCG/ACT nasal spray PLACE 2 SPRAYS INTO BOTH NOSTRILS AT BEDTIME. 48 g 1   fulvestrant (FASLODEX) 250 MG/5ML injection Inject 500 mg into the muscle every 30 (thirty) days. One injection each buttock over 1-2 minutes. Warm prior to use.     Krill Oil 1000 MG CAPS Take 1,000 mg by mouth daily.     lisinopril (ZESTRIL) 5 MG tablet TAKE 1 TABLET BY MOUTH EVERY DAY IN THE EVENING 90 tablet 3   loperamide (IMODIUM A-D) 2 MG tablet Take 2 tablets (4 mg total) by mouth 4 (four) times daily as needed for diarrhea or loose stools. 30 tablet 0   metFORMIN (GLUCOPHAGE) 500 MG tablet TAKE 1 TABLET BY MOUTH 2 TIMES DAILY WITH A MEAL. 180 tablet 2   oxyCODONE (OXY IR/ROXICODONE) 5 MG immediate release tablet Take 1 tablet (5 mg total) by mouth every 4 (four) hours as needed for moderate pain (pain score 4-6). 30 tablet 0   polyethylene glycol powder (GLYCOLAX/MIRALAX) powder Take 17 g by mouth 2 (two) times daily as needed for moderate constipation. 3350 g 1   prochlorperazine (COMPAZINE) 10 MG tablet Take 1 tablet (10 mg total) by mouth every 6 (six) hours as needed for nausea or vomiting. 20 tablet 1   sodium chloride (OCEAN) 0.65 % SOLN nasal spray Place 1 spray into both nostrils as needed for congestion.     traMADol (ULTRAM) 50 MG tablet Take 1-2 tablets (50-100 mg total) by mouth every 4 (four) hours as needed for moderate pain. 30 tablet 0   traZODone (DESYREL) 50 MG tablet TAKE 1 TABLET BY MOUTH AT BEDTIME AS NEEDED FOR SLEEP. 90 tablet 0   venlafaxine XR (EFFEXOR-XR) 75 MG 24 hr capsule Take 1 capsule (75 mg total) by mouth daily with breakfast. 360 capsule 0   vitamin B-12 (CYANOCOBALAMIN) 1000 MCG tablet Take 2,500 mcg by mouth every Monday, Wednesday, and Friday.     Vitamin D, Cholecalciferol, 50 MCG (2000 UT) CAPS Take 2,000 Units by mouth every Monday, Wednesday, and Friday.     diclofenac Sodium (VOLTAREN) 1 % GEL Apply 2 g topically 3 (three) times daily as needed. (Patient not  taking: Reported on 03/12/2022) 100 g 2   omeprazole (PRILOSEC) 40 MG capsule TAKE 1 CAPSULE (40 MG TOTAL) BY MOUTH 2 (TWO) TIMES DAILY BEFORE A MEAL. 180 capsule 1   ribociclib succ (KISQALI, 600 MG DOSE,) 200 MG Therapy Pack Take 3 tablets (600 mg total) by mouth daily. Take for 21 days on, 7 days off, repeat every 28 days. 63   tablet 3   senna-docusate (SENOKOT-S) 8.6-50 MG tablet Take 1 tablet by mouth 2 (two) times daily. (Patient not taking: Reported on 03/12/2022)     No current facility-administered medications for this visit.    OBJECTIVE: BP 126/72   Pulse 96   Temp (!) 97.1 F (36.2 C) (Tympanic)   Resp 18   Wt 189 lb 4.8 oz (85.9 kg)   LMP 12/22/1999 (Approximate)   BMI 32.49 kg/m    Body mass index is 32.49 kg/m.    ECOG FS:0 - Asymptomatic  General: Well-developed, well-nourished, no acute distress. Eyes: Pink conjunctiva, anicteric sclera. HEENT: Normocephalic, moist mucous membranes. Breast: Bilateral mastectomy. Lungs: No audible wheezing or coughing. Heart: Regular rate and rhythm. Abdomen: Soft, nontender, no obvious distention. Musculoskeletal: No edema, cyanosis, or clubbing. Neuro: Alert, answering all questions appropriately. Cranial nerves grossly intact. Skin: No rashes or petechiae noted. Psych: Normal affect.   LAB RESULTS:  Appointment on 04/09/2022  Component Date Value Ref Range Status   CA 27.29 04/09/2022 116.1 (H)  0.0 - 38.6 U/mL Final   Comment: (NOTE) Siemens Centaur Immunochemiluminometric Methodology (ICMA) Values obtained with different assay methods or kits cannot be used interchangeably. Results cannot be interpreted as absolute evidence of the presence or absence of malignant disease. Performed At: [facility] [address] [place], [place] [id number] Nagendra Sanjai MD Ph:[phone]    Magnesium 04/09/2022 1.6 (L)  1.7 - 2.4 mg/dL Final   Performed at [facility], [address] [place], [place] [address]    Sodium 04/09/2022 139  135 - 145 mmol/L Final   Potassium 04/09/2022 4.2  3.5 - 5.1 mmol/L Final   Chloride 04/09/2022 105  98 - 111 mmol/L Final   CO2 04/09/2022 25  22 - 32 mmol/L Final   Glucose, Bld 04/09/2022 110 (H)  70 - 99 mg/dL Final   Glucose reference range applies only to samples taken after fasting for at least 8 hours.   BUN 04/09/2022 19  8 - 23 mg/dL Final   Creatinine, Ser 04/09/2022 1.48 (H)  0.44 - 1.00 mg/dL Final   Calcium 04/09/2022 8.7 (L)  8.9 - 10.3 mg/dL Final   Total Protein 04/09/2022 7.3  6.5 - 8.1 g/dL Final   Albumin 04/09/2022 3.8  3.5 - 5.0 g/dL Final   AST 04/09/2022 25  15 - 41 U/L Final   ALT 04/09/2022 15  0 - 44 U/L Final   Alkaline Phosphatase 04/09/2022 65  38 - 126 U/L Final   Total Bilirubin 04/09/2022 0.5  0.3 - 1.2 mg/dL Final   GFR, Estimated 04/09/2022 38 (L)  >60 mL/min Final   Comment: (NOTE) Calculated using the CKD-EPI Creatinine Equation (2021)    Anion gap 04/09/2022 9  5 - 15 Final   Performed at [facility], [address] [place], [place] [address]   WBC 04/09/2022 4.8  4.0 - 10.5 K/uL Final   RBC 04/09/2022 2.40 (L)  3.87 - 5.11 MIL/uL Final   Hemoglobin 04/09/2022 8.2 (L)  12.0 - 15.0 g/dL Final   HCT 04/09/2022 24.5 (L)  36.0 - 46.0 % Final   MCV 04/09/2022 102.1 (H)  80.0 - 100.0 fL Final   MCH 04/09/2022 34.2 (H)  26.0 - 34.0 pg Final   MCHC 04/09/2022 33.5  30.0 - 36.0 g/dL Final   RDW 04/09/2022 16.5 (H)  11.5 - 15.5 % Final   Platelets 04/09/2022 190  150 - 400 K/uL Final   nRBC 04/09/2022 0.0    0.0 - 0.2 % Final   Neutrophils Relative % 04/09/2022 45  % Final   Neutro Abs 04/09/2022 2.2  1.7 - 7.7 K/uL Final   Lymphocytes Relative 04/09/2022 35  % Final   Lymphs Abs 04/09/2022 1.6  0.7 - 4.0 K/uL Final   Monocytes Relative 04/09/2022 15  % Final   Monocytes Absolute 04/09/2022 0.7  0.1 - 1.0 K/uL Final   Eosinophils Relative 04/09/2022 4  % Final   Eosinophils Absolute 04/09/2022 0.2  0.0 - 0.5 K/uL Final    Basophils Relative 04/09/2022 1  % Final   Basophils Absolute 04/09/2022 0.1  0.0 - 0.1 K/uL Final   Immature Granulocytes 04/09/2022 0  % Final   Abs Immature Granulocytes 04/09/2022 0.01  0.00 - 0.07 K/uL Final   Performed at [facility], [address] [place], [place] [address]    STUDIES: No results found.  ONCOLOGY HISTORY:  Bilateral adenocarcinoma of the breast. Patient is status post bilateral mastectomy in February 2015. She also received adjuvant chemotherapy with Adriamycin, Cytoxan, and Taxol completing treatment in approximately October 2015. She then initiated letrozole, but could not tolerate secondary to leg pain and was switched to Aromasin.  Patient then switched to tamoxifen in October 2019 secondary to cost.  Patient subsequently discontinued tamoxifen and did not complete the recommended 5 years of treatment.   ASSESSMENT: Recurrent, metastatic ER/PR, HER2 negative invasive carcinoma of the breast.   PLAN:    1. Recurrent, metastatic ER/PR, HER2 negative invasive carcinoma of the breast: See oncology history as above.  Biopsy confirmed recurrent disease.  PET scan results from December 26, 2021 reviewed independently with hypermetabolic left supraclavicular, subpectoral, and axillary lymphadenopathy consistent with nodal recurrence of patient's history of breast cancer.  No distant metastases were identified.  Her initial CA 27-29 was reported 260.1.  This is now trended down to 116.1. Continue Kisqali 600 mg daily for 21 days with 7 days off.  Proceed with monthly fulvestrant today.  Return to clinic in 4 weeks for further evaluation and continuation of treatment.  Appreciate clinical pharmacy input.  Will get repeat PET scan prior to next visit. 2.  Osteopenia: Patient's last bone mineral density was on August 01, 2016 and revealed a T-score of -1.2. Continue monitoring bone mineral density by PCP. 3.  Multiple sclerosis: Chronic.  Continue evaluation and treatment  per neurology.  4.  Esophageal discomfort/mild dysphagia: Patient does not complain of this today.  Recent colonoscopy and EGD did not reveal any significant pathology. 5.  Renal insufficiency: Chronic and unchanged.  Patient's creatinine has trended up to 1.47, but her GFR remains greater than 30.  Kisqali needs to be dose reduced if GFR falls below 30. 6.  Anemia: Hemoglobin has declined to 8.2, but patient is asymptomatic.  Continue to monitor and consider transfusion if it declines further. 7.  Diarrhea: Patient does not complain of this today.  Continue Imodium as needed. 8.  Hypomagnesia: Resolved. 9.  Right knee replacement: Continue follow-up with orthopedics as indicated.  Patient has nearly completed rehab.    Patient expressed understanding and was in agreement with this plan. She also understands that She can call clinic at any time with any questions, concerns, or complaints.   Breast cancer bilateral, multifocal ER/PR pos, Her 2 neg,.   Staging form: Breast, AJCC 7th Edition     Clinical: Stage IIB (T2, N1, M0) - Signed by Janak Choksi, MD on 04/22/2015   Timothy J Finnegan, MD     04/11/2022 6:59 AM      

## 2022-04-07 DIAGNOSIS — Z96651 Presence of right artificial knee joint: Secondary | ICD-10-CM | POA: Diagnosis not present

## 2022-04-09 ENCOUNTER — Inpatient Hospital Stay: Payer: Medicare Other | Admitting: Pharmacist

## 2022-04-09 ENCOUNTER — Inpatient Hospital Stay (HOSPITAL_BASED_OUTPATIENT_CLINIC_OR_DEPARTMENT_OTHER): Payer: Medicare Other | Admitting: Oncology

## 2022-04-09 ENCOUNTER — Inpatient Hospital Stay: Payer: Medicare Other | Attending: Oncology

## 2022-04-09 ENCOUNTER — Encounter: Payer: Self-pay | Admitting: Oncology

## 2022-04-09 ENCOUNTER — Inpatient Hospital Stay: Payer: Medicare Other

## 2022-04-09 VITALS — BP 126/72 | HR 96 | Temp 97.1°F | Resp 18 | Wt 189.3 lb

## 2022-04-09 DIAGNOSIS — C773 Secondary and unspecified malignant neoplasm of axilla and upper limb lymph nodes: Secondary | ICD-10-CM | POA: Diagnosis present

## 2022-04-09 DIAGNOSIS — C50919 Malignant neoplasm of unspecified site of unspecified female breast: Secondary | ICD-10-CM

## 2022-04-09 DIAGNOSIS — C50911 Malignant neoplasm of unspecified site of right female breast: Secondary | ICD-10-CM | POA: Insufficient documentation

## 2022-04-09 DIAGNOSIS — G35 Multiple sclerosis: Secondary | ICD-10-CM | POA: Insufficient documentation

## 2022-04-09 DIAGNOSIS — Z170 Estrogen receptor positive status [ER+]: Secondary | ICD-10-CM | POA: Insufficient documentation

## 2022-04-09 DIAGNOSIS — Z79811 Long term (current) use of aromatase inhibitors: Secondary | ICD-10-CM | POA: Insufficient documentation

## 2022-04-09 DIAGNOSIS — C50912 Malignant neoplasm of unspecified site of left female breast: Secondary | ICD-10-CM | POA: Insufficient documentation

## 2022-04-09 DIAGNOSIS — M8580 Other specified disorders of bone density and structure, unspecified site: Secondary | ICD-10-CM | POA: Diagnosis not present

## 2022-04-09 DIAGNOSIS — D649 Anemia, unspecified: Secondary | ICD-10-CM | POA: Insufficient documentation

## 2022-04-09 DIAGNOSIS — Z79899 Other long term (current) drug therapy: Secondary | ICD-10-CM | POA: Diagnosis not present

## 2022-04-09 LAB — COMPREHENSIVE METABOLIC PANEL
ALT: 15 U/L (ref 0–44)
AST: 25 U/L (ref 15–41)
Albumin: 3.8 g/dL (ref 3.5–5.0)
Alkaline Phosphatase: 65 U/L (ref 38–126)
Anion gap: 9 (ref 5–15)
BUN: 19 mg/dL (ref 8–23)
CO2: 25 mmol/L (ref 22–32)
Calcium: 8.7 mg/dL — ABNORMAL LOW (ref 8.9–10.3)
Chloride: 105 mmol/L (ref 98–111)
Creatinine, Ser: 1.48 mg/dL — ABNORMAL HIGH (ref 0.44–1.00)
GFR, Estimated: 38 mL/min — ABNORMAL LOW (ref >60)
Glucose, Bld: 110 mg/dL — ABNORMAL HIGH (ref 70–99)
Potassium: 4.2 mmol/L (ref 3.5–5.1)
Sodium: 139 mmol/L (ref 135–145)
Total Bilirubin: 0.5 mg/dL (ref 0.3–1.2)
Total Protein: 7.3 g/dL (ref 6.5–8.1)

## 2022-04-09 LAB — CBC WITH DIFFERENTIAL/PLATELET
Abs Immature Granulocytes: 0.01 10*3/uL (ref 0.00–0.07)
Basophils Absolute: 0.1 10*3/uL (ref 0.0–0.1)
Basophils Relative: 1 %
Eosinophils Absolute: 0.2 10*3/uL (ref 0.0–0.5)
Eosinophils Relative: 4 %
HCT: 24.5 % — ABNORMAL LOW (ref 36.0–46.0)
Hemoglobin: 8.2 g/dL — ABNORMAL LOW (ref 12.0–15.0)
Immature Granulocytes: 0 %
Lymphocytes Relative: 35 %
Lymphs Abs: 1.6 10*3/uL (ref 0.7–4.0)
MCH: 34.2 pg — ABNORMAL HIGH (ref 26.0–34.0)
MCHC: 33.5 g/dL (ref 30.0–36.0)
MCV: 102.1 fL — ABNORMAL HIGH (ref 80.0–100.0)
Monocytes Absolute: 0.7 10*3/uL (ref 0.1–1.0)
Monocytes Relative: 15 %
Neutro Abs: 2.2 10*3/uL (ref 1.7–7.7)
Neutrophils Relative %: 45 %
Platelets: 190 10*3/uL (ref 150–400)
RBC: 2.4 MIL/uL — ABNORMAL LOW (ref 3.87–5.11)
RDW: 16.5 % — ABNORMAL HIGH (ref 11.5–15.5)
WBC: 4.8 10*3/uL (ref 4.0–10.5)
nRBC: 0 % (ref 0.0–0.2)

## 2022-04-09 LAB — MAGNESIUM: Magnesium: 1.6 mg/dL — ABNORMAL LOW (ref 1.7–2.4)

## 2022-04-09 MED ORDER — FULVESTRANT 250 MG/5ML IM SOSY
500.0000 mg | PREFILLED_SYRINGE | Freq: Once | INTRAMUSCULAR | Status: AC
  Administered 2022-04-09: 500 mg via INTRAMUSCULAR
  Filled 2022-04-09: qty 10

## 2022-04-09 MED ORDER — RIBOCICLIB SUCC (600 MG DOSE) 200 MG PO TBPK: 600.0000 mg | ORAL_TABLET | Freq: Every day | ORAL | 3 refills | Status: DC

## 2022-04-09 NOTE — Progress Notes (Signed)
Patient denies any concerns today.  

## 2022-04-09 NOTE — Progress Notes (Signed)
[facility]  [phone] [phone]  Patient Care Team: [name], DO as PCP - General (Family Medicine) Christene Lye, MD (General Surgery) Forest Gleason, MD (Oncology) Pieter Partridge, DO as Consulting Physician (Neurology) Lloyd Huger, MD as Consulting Physician (Oncology)   Name of the patient: Cassidy Norris  [id number]  [birth year]   Date of visit: 04/09/22  HPI: Patient is a 71 y.o. female with recurrent breast cancer, ER/PR positive/HER2 negative. Currently treating with Kisqali (ribociclib) and fulvestrant. She started ribociclib on 01/08/22.  Reason for Consult: Oral chemotherapy follow-up for ribociclib therapy.   PAST MEDICAL HISTORY: Past Medical History:  Diagnosis Date   Anemia    Arthritis not really diagnosis but have pain   B12 deficiency 07/23/2016   Will check levels to determine if injections are needed again. Await results. Recent CBC at Onc [facility]   Blood transfusion without reported diagnosis 1983 had a miscarriage/dnc   Cancer ([place]) 12/30/2013   Multifocal disease: pT1c,m, N0(isolated tumor cells) Er/ PR positive, Her 2 negative.   Cancer ([place]) 01/09/2014   Invasive lobular carcinoma, 2.2 cm, T2,N1 ER/PR positive, HER-2/neu negative   Cataract 2014?   Centrilobular emphysema ([place]) 46/80/3212   Complication of anesthesia 03/06/2022   Prolonged metabolism of Rocuronium during case.   Diabetes mellitus without complication ([place])    Diffuse cystic mastopathy    Erosive esophagitis    Essential hypertension, benign 09/24/2017   Family history of malignant neoplasm of gastrointestinal tract 2012   Fractured elbow 08/28/2014   Gastroesophageal reflux disease    Hx of metabolic acidosis with increased anion gap    Increased heart rate    Multiple sclerosis (HCC)    Neuromuscular disorder (HCC)    neuropathy in bil feet - left is worse.   Obesity, unspecified     Peripheral neuropathy    Personal history of tobacco use, presenting hazards to health    Restless leg syndrome    Sleep apnea    uses CPAP   Special screening for malignant neoplasms, colon     HEMATOLOGY/ONCOLOGY HISTORY:  Oncology History Overview Note  1. Bilateral carcinoma of breast, February, 2015. Right breast status post radical mastectomy. T1 C. N0M0 (isolated tumor cells in one lymph node) multifocal invasive cancer. Stage IC, Left breast, Status post radical mastectomy, T2, N1, M0 tumor. Stage II, RIght breast. Both tumors are estrogen receptor positive.  Progesterone receptor positive.  HER-2 receptor negative by FISH. Negative for BRCA mutation.  2. Started on Adriamycin and Cytoxan on March 01, 2014. Last cycle of Cytoxan and Adriamycin on May 01, 2014. Patient has finished last chemotherapy on September 9. (12 th dose was omitted because of neuropathy) 3.  Taking tamoxifen.  Patient had a poor tolerance to letrozole.(October, 2015) 4.  Patient is taking letrozole since November of 2015 5.  May, 2016 Letrozole has been put on hold because of bony pains 6.  Started on Aromasin from July of 2016    History of bilateral breast cancer (Resolved)  11/29/2013 Initial Diagnosis   Breast cancer bilateral, multifocal ER/PR pos, Her 2 neg,.      ALLERGIES:  has No Known Allergies.  MEDICATIONS:  Current Outpatient Medications  Medication Sig Dispense Refill   acetaminophen (TYLENOL) 500 MG tablet Take 1,000 mg by mouth 2 (two) times daily as needed for moderate pain or headache.     aspirin 81 MG chewable tablet Chew by mouth daily.  baclofen (LIORESAL) 10 MG tablet Take 1 tablet (10 mg total) by mouth 3 (three) times daily as needed for muscle spasms. (Patient taking differently: Take 10 mg by mouth 2 (two) times daily.) 1080 tablet 0   calcium carbonate (OSCAL) 1500 (600 Ca) MG TABS tablet Take by mouth 2 (two) times daily with a meal.     celecoxib (CELEBREX) 200 MG  capsule Take 1 capsule (200 mg total) by mouth 2 (two) times daily. 15 capsule 0   diclofenac Sodium (VOLTAREN) 1 % GEL Apply 2 g topically 3 (three) times daily as needed. (Patient not taking: Reported on 03/12/2022) 100 g 2   famotidine (PEPCID) 20 MG tablet TAKE 1 TABLET BY MOUTH TWICE A DAY BEFORE MEALS 180 tablet 1   fluticasone (FLONASE) 50 MCG/ACT nasal spray PLACE 2 SPRAYS INTO BOTH NOSTRILS AT BEDTIME. 48 g 1   fulvestrant (FASLODEX) 250 MG/5ML injection Inject 500 mg into the muscle every 30 (thirty) days. One injection each buttock over 1-2 minutes. Warm prior to use.     Krill Oil 1000 MG CAPS Take 1,000 mg by mouth daily.     lisinopril (ZESTRIL) 5 MG tablet TAKE 1 TABLET BY MOUTH EVERY DAY IN THE EVENING 90 tablet 3   loperamide (IMODIUM A-D) 2 MG tablet Take 2 tablets (4 mg total) by mouth 4 (four) times daily as needed for diarrhea or loose stools. 30 tablet 0   metFORMIN (GLUCOPHAGE) 500 MG tablet TAKE 1 TABLET BY MOUTH 2 TIMES DAILY WITH A MEAL. 180 tablet 2   omeprazole (PRILOSEC) 40 MG capsule TAKE 1 CAPSULE (40 MG TOTAL) BY MOUTH 2 (TWO) TIMES DAILY BEFORE A MEAL. 180 capsule 1   oxyCODONE (OXY IR/ROXICODONE) 5 MG immediate release tablet Take 1 tablet (5 mg total) by mouth every 4 (four) hours as needed for moderate pain (pain score 4-6). 30 tablet 0   polyethylene glycol powder (GLYCOLAX/MIRALAX) powder Take 17 g by mouth 2 (two) times daily as needed for moderate constipation. 3350 g 1   prochlorperazine (COMPAZINE) 10 MG tablet Take 1 tablet (10 mg total) by mouth every 6 (six) hours as needed for nausea or vomiting. 20 tablet 1   ribociclib succ (KISQALI, 600 MG DOSE,) 200 MG Therapy Pack Take 3 tablets (600 mg total) by mouth daily. Take for 21 days on, 7 days off, repeat every 28 days. 63 tablet 0   senna-docusate (SENOKOT-S) 8.6-50 MG tablet Take 1 tablet by mouth 2 (two) times daily. (Patient not taking: Reported on 03/12/2022)     sodium chloride (OCEAN) 0.65 % SOLN nasal  spray Place 1 spray into both nostrils as needed for congestion.     traMADol (ULTRAM) 50 MG tablet Take 1-2 tablets (50-100 mg total) by mouth every 4 (four) hours as needed for moderate pain. 30 tablet 0   traZODone (DESYREL) 50 MG tablet TAKE 1 TABLET BY MOUTH AT BEDTIME AS NEEDED FOR SLEEP. 90 tablet 0   venlafaxine XR (EFFEXOR-XR) 75 MG 24 hr capsule Take 1 capsule (75 mg total) by mouth daily with breakfast. 360 capsule 0   vitamin B-12 (CYANOCOBALAMIN) 1000 MCG tablet Take 2,500 mcg by mouth every Monday, Wednesday, and Friday.     Vitamin D, Cholecalciferol, 50 MCG (2000 UT) CAPS Take 2,000 Units by mouth every Monday, Wednesday, and Friday.     No current facility-administered medications for this visit.    VITAL SIGNS: LMP 12/22/1999 (Approximate)  There were no vitals filed for this visit.  Estimated  body mass index is 32.49 kg/m as calculated from the following:   Height as of 03/12/22: 5' 4"  (1.626 m).   Weight as of an earlier encounter on 04/09/22: 85.9 kg (189 lb 4.8 oz).  LABS: CBC:    Component Value Date/Time   WBC 4.8 04/09/2022 0841   HGB 8.2 (L) 04/09/2022 0841   HGB 15.5 07/13/2017 0937   HCT 24.5 (L) 04/09/2022 0841   HCT 46.7 (H) 07/13/2017 0937   PLT 190 04/09/2022 0841   PLT 211 07/13/2017 0937   MCV 102.1 (H) 04/09/2022 0841   MCV 94 07/13/2017 0937   MCV 93 03/11/2015 1356   NEUTROABS 2.2 04/09/2022 0841   NEUTROABS 5.4 07/13/2017 0937   NEUTROABS 5.6 03/11/2015 1356   LYMPHSABS 1.6 04/09/2022 0841   LYMPHSABS 2.1 07/13/2017 0937   LYMPHSABS 2.3 03/11/2015 1356   MONOABS 0.7 04/09/2022 0841   MONOABS 1.0 (H) 03/11/2015 1356   EOSABS 0.2 04/09/2022 0841   EOSABS 0.2 07/13/2017 0937   EOSABS 0.3 03/11/2015 1356   BASOSABS 0.1 04/09/2022 0841   BASOSABS 0.0 07/13/2017 0937   BASOSABS 0.1 03/11/2015 1356   Comprehensive Metabolic Panel:    Component Value Date/Time   NA 139 04/09/2022 0841   NA 142 07/14/2019 1516   NA 139 03/11/2015 1356    K 4.2 04/09/2022 0841   K 3.4 (L) 03/11/2015 1356   CL 105 04/09/2022 0841   CL 103 03/11/2015 1356   CO2 25 04/09/2022 0841   CO2 28 03/11/2015 1356   BUN 19 04/09/2022 0841   BUN 12 07/14/2019 1516   BUN 13 03/11/2015 1356   CREATININE 1.48 (H) 04/09/2022 0841   CREATININE 0.85 10/20/2021 0838   GLUCOSE 110 (H) 04/09/2022 0841   GLUCOSE 118 (H) 03/11/2015 1356   CALCIUM 8.7 (L) 04/09/2022 0841   CALCIUM 9.2 03/11/2015 1356   AST 25 04/09/2022 0841   AST 28 03/11/2015 1356   ALT 15 04/09/2022 0841   ALT 32 03/11/2015 1356   ALKPHOS 65 04/09/2022 0841   ALKPHOS 68 03/11/2015 1356   BILITOT 0.5 04/09/2022 0841   BILITOT 0.4 07/14/2019 1516   BILITOT 0.7 03/11/2015 1356   PROT 7.3 04/09/2022 0841   PROT 6.9 07/14/2019 1516   PROT 6.9 03/11/2015 1356   ALBUMIN 3.8 04/09/2022 0841   ALBUMIN 4.5 07/14/2019 1516   ALBUMIN 4.2 03/11/2015 1356     Present during today's visit: patient and patient's sister  Assessment and Plan: Continue ribociclib 600 mg 21 days on/14 days off.  CBC and CMP assessed Hgb decreased since last visit, this could be related to recent knee surgery. She is not feeling SOB or fatigued. Will continue to monitor. SCr slightly increased, will keep monitoring, patient encouraged to stay hydrated   Oral Chemotherapy Side Effect/Intolerance:  Constipation: mild, she is going to give it a few days, then start back on her stool softener if needed Nausea/Vomiting: 2 days of during week 3 of last cycle, she thinks this was from a meal she ate No reported fatigue  Other: Patient is recovering well from her knee surgery and is almost done with physical therapy.   Oral Chemotherapy Adherence: no missed dose reported No patient barriers to medication adherence identified.   New medications: none reported  Medication Access Issues: no issues, refill for ribociclib sent to [facility] today  Patient expressed understanding and was in agreement with  this plan. She also understands that She can call clinic at any time  with any questions, concerns, or complaints.   Follow-up plan: RTC in 4 weeks  Thank you for allowing me to participate in the care of this very pleasant patient.   Time Total: 15 min  Visit consisted of counseling and education on dealing with issues of symptom management in the setting of serious and potentially life-threatening illness.Greater than 50%  of this time was spent counseling and coordinating care related to the above assessment and plan.  Signed by: Darl Pikes, PharmD, BCPS, Salley Slaughter, CPP Hematology/Oncology Clinical Pharmacist Practitioner [place]/DB/AP [facility] [phone]  04/09/2022 12:13 PM

## 2022-04-10 DIAGNOSIS — Z96651 Presence of right artificial knee joint: Secondary | ICD-10-CM | POA: Diagnosis not present

## 2022-04-10 LAB — CANCER ANTIGEN 27.29: CA 27.29: 116.1 U/mL — ABNORMAL HIGH (ref 0.0–38.6)

## 2022-04-11 ENCOUNTER — Encounter: Payer: Self-pay | Admitting: Oncology

## 2022-04-13 DIAGNOSIS — Z96651 Presence of right artificial knee joint: Secondary | ICD-10-CM | POA: Diagnosis not present

## 2022-04-16 DIAGNOSIS — R29898 Other symptoms and signs involving the musculoskeletal system: Secondary | ICD-10-CM | POA: Diagnosis not present

## 2022-04-16 DIAGNOSIS — Z96651 Presence of right artificial knee joint: Secondary | ICD-10-CM | POA: Diagnosis not present

## 2022-04-17 ENCOUNTER — Encounter: Payer: Self-pay | Admitting: Pharmacist

## 2022-04-23 ENCOUNTER — Encounter
Admission: RE | Admit: 2022-04-23 | Discharge: 2022-04-23 | Disposition: A | Discharge status: Home / Self Care | Payer: Medicare Other | Source: Ambulatory Visit | Attending: Oncology | Admitting: Oncology

## 2022-04-23 DIAGNOSIS — I700 Atherosclerosis of aorta: Secondary | ICD-10-CM | POA: Insufficient documentation

## 2022-04-23 DIAGNOSIS — C50919 Malignant neoplasm of unspecified site of unspecified female breast: Secondary | ICD-10-CM | POA: Insufficient documentation

## 2022-04-23 DIAGNOSIS — R590 Localized enlarged lymph nodes: Secondary | ICD-10-CM | POA: Diagnosis not present

## 2022-04-23 DIAGNOSIS — J439 Emphysema, unspecified: Secondary | ICD-10-CM | POA: Diagnosis not present

## 2022-04-23 LAB — GLUCOSE, CAPILLARY: Glucose-Capillary: 95 mg/dL (ref 70–99)

## 2022-04-23 MED ORDER — FLUDEOXYGLUCOSE F - 18 (FDG) INJECTION
9.8000 | Freq: Once | INTRAVENOUS | Status: AC | PRN
  Administered 2022-04-23: 10.42 via INTRAVENOUS

## 2022-04-28 ENCOUNTER — Other Ambulatory Visit: Payer: Self-pay | Admitting: Family Medicine

## 2022-04-28 ENCOUNTER — Encounter: Payer: Self-pay | Admitting: Family Medicine

## 2022-04-28 ENCOUNTER — Ambulatory Visit (INDEPENDENT_AMBULATORY_CARE_PROVIDER_SITE_OTHER): Payer: Medicare Other | Admitting: Family Medicine

## 2022-04-28 VITALS — BP 122/64 | HR 91 | Ht 64.0 in | Wt 180.0 lb

## 2022-04-28 DIAGNOSIS — G720 Drug-induced myopathy: Secondary | ICD-10-CM

## 2022-04-28 DIAGNOSIS — I700 Atherosclerosis of aorta: Secondary | ICD-10-CM

## 2022-04-28 DIAGNOSIS — E1169 Type 2 diabetes mellitus with other specified complication: Secondary | ICD-10-CM

## 2022-04-28 DIAGNOSIS — E785 Hyperlipidemia, unspecified: Secondary | ICD-10-CM

## 2022-04-28 DIAGNOSIS — G35D Multiple sclerosis, unspecified: Secondary | ICD-10-CM

## 2022-04-28 DIAGNOSIS — Z0000 Encounter for general adult medical examination without abnormal findings: Secondary | ICD-10-CM

## 2022-04-28 DIAGNOSIS — G35 Multiple sclerosis: Secondary | ICD-10-CM

## 2022-04-28 LAB — POCT GLYCOSYLATED HEMOGLOBIN (HGB A1C): Hemoglobin A1C: 5.9 % — AB (ref 4.0–5.6)

## 2022-04-28 NOTE — Patient Instructions (Addendum)
Thank you for coming to the office today.  Good updates on the PET Scan. It looks reassuring  The knee looks to be healing fine.  Recent Labs    10/20/21 0838 02/23/22 0953 04/28/22 0854  HGBA1C 6.6* 6.2* 5.9*   DUE for FASTING BLOOD WORK (no food or drink after midnight before the lab appointment, only water or coffee without cream/sugar on the morning of)  SCHEDULE "Lab Only" visit in the morning at the clinic for lab draw in 6 MONTHS   - Make sure Lab Only appointment is at about 1 week before your next appointment, so that results will be available  For Lab Results, once available within 2-3 days of blood draw, you can can log in to MyChart online to view your results and a brief explanation. Also, we can discuss results at next follow-up visit.   Please schedule a Follow-up Appointment to: Return in about 6 months (around 10/28/2022) for 6 month fasting lab only then 1 week later Medicare Physical.  If you have any other questions or concerns, please feel free to call the office or send a message through [facility]. You may also schedule an earlier appointment if necessary.  Additionally, you may be receiving a survey about your experience at our office within a few days to 1 week by e-mail or mail. We value your feedback.  Nobie Putnam, DO [facility]

## 2022-04-28 NOTE — Progress Notes (Signed)
Subjective:    Patient ID: Cassidy Norris, female    DOB: December 25, 1950, 71 y.o.   MRN: [id number]  SHAINDEL SWEETEN is a 71 y.o. female presenting on 04/28/2022 for Diabetes, Hypertension, and Hyperlipidemia   HPI  Repeat Breast Cancer Followed by Dr Grayland Ormond, [facility] PET Scan recently without new abnormality, shows improvement.  Right Knee Right knee incision with some issues with healing now improved, had small cystlike swelling  CHRONIC DM, Type 2: Reports A1c down to 6.6 improved CBG Meds: Metformin 500 BID Reports good compliance. Tolerating well w/o side-effects Currently on ACEi Lifestyle: - Diet (admits starches were increasing her sugars now improving)  Denies hypoglycemia, polyuria, visual changes, numbness or tingling.   HYPERLIPIDEMIA: - Reports no concerns. Last lipid panel 09/2021, elevated overall but improved LDL On Krill Oil, not on statin due to myopathy    Chemotherapy Induced Neuropathy Chronic problem with nerve pain Previous treatment not effective         04/28/2022    8:53 AM 10/27/2021    8:27 AM 10/21/2020    8:12 AM  Depression screen PHQ 2/9  Decreased Interest 0 0 0  Down, Depressed, Hopeless 0 0 0  PHQ - 2 Score 0 0 0  Altered sleeping 3 3   Tired, decreased energy 0 2   Change in appetite 1 1   Feeling bad or failure about yourself  0 0   Trouble concentrating 0 0   Moving slowly or fidgety/restless 0 0   Suicidal thoughts 0 0   PHQ-9 Score 4 6   Difficult doing work/chores Not difficult at all Not difficult at all     Social History   Tobacco Use   Smoking status: Former    Packs/day: 1.00    Years: 30.00    Pack years: 30.00    Types: Cigarettes    Quit date: 12/11/2013    Years since quitting: 8.3   Smokeless tobacco: Former  Scientific laboratory technician Use: Never used  Substance Use Topics   Alcohol use: Not Currently   Drug use: No    Review of Systems Per HPI unless specifically indicated above      Objective:    BP 122/64   Pulse 91   Ht '5\' 4"'$  (1.626 m)   Wt 180 lb (81.6 kg)   LMP 12/22/1999 (Approximate)   SpO2 99%   BMI 30.90 kg/m   Wt Readings from Last 3 Encounters:  04/28/22 180 lb (81.6 kg)  04/09/22 189 lb 4.8 oz (85.9 kg)  03/12/22 190 lb (86.2 kg)    Physical Exam Vitals and nursing note reviewed.  Constitutional:      General: She is not in acute distress.    Appearance: She is well-developed. She is not diaphoretic.     Comments: Well-appearing, comfortable, cooperative  HENT:     Head: Normocephalic and atraumatic.  Eyes:     General:        Right eye: No discharge.        Left eye: No discharge.     Conjunctiva/sclera: Conjunctivae normal.  Neck:     Thyroid: No thyromegaly.  Cardiovascular:     Rate and Rhythm: Normal rate and regular rhythm.     Heart sounds: Normal heart sounds. No murmur heard. Pulmonary:     Effort: Pulmonary effort is normal. No respiratory distress.     Breath sounds: Normal breath sounds. No wheezing or rales.  Musculoskeletal:  General: Normal range of motion.     Cervical back: Normal range of motion and neck supple.  Lymphadenopathy:     Cervical: No cervical adenopathy.  Skin:    General: Skin is warm and dry.     Findings: No erythema or rash.  Neurological:     Mental Status: She is alert and oriented to person, place, and time.  Psychiatric:        Behavior: Behavior normal.     Comments: Well groomed, good eye contact, normal speech and thoughts    I have personally reviewed the radiology report from 04/23/22.  CLINICAL DATA:  Subsequent treatment strategy for recurrent malignant neoplasm of breast.   EXAM: NUCLEAR MEDICINE PET SKULL BASE TO THIGH   TECHNIQUE: 10.42 mCi F-18 FDG was injected intravenously. Full-ring PET imaging was performed from the skull base to thigh after the radiotracer. CT data was obtained and used for attenuation correction and anatomic localization.   Fasting blood  glucose: 95 mg/dl   COMPARISON:  Multiple priors including most recent PET-CT December 25, 2021 and CT abdomen pelvis January 29, 2019   FINDINGS: Mediastinal blood pool activity: SUV max 1.8   Liver activity: SUV max NA   NECK: No hypermetabolic cervical adenopathy.   Incidental CT findings: none   CHEST: Decreased size and metabolic activity in the left supraclavicular, subpectoral and axillary lymph nodes. Index lymph nodes are as follows:   -Minimally metabolic soft tissue stranding in the region of the prior supraclavicular lymph node for instance on axial fused image 59 previously measuring 3.9 x 2.7 cm with a max SUV of 7.5.   -left axillary lymph node now measures 11 mm in short axis on image 72/2 with a max SUV of 2.7 previously measuring 3.2 cm in short axis with a max SUV of 8.7.   No hypermetabolic mediastinal, hilar internal mammary or right axillary adenopathy.   No hypermetabolic pulmonary nodules or masses.   Incidental CT findings: Aortic atherosclerosis. Trace pericardial effusion is similar prior and within physiologic normal limits. Emphysema.   ABDOMEN/PELVIS: No abnormal hypermetabolic activity within the liver, pancreas, adrenal glands, or spleen. No hypermetabolic lymph nodes in the abdomen or pelvis.   Incidental CT findings: Benign left upper pole renal cyst does not demonstrate abnormal FDG avidity and requires no imaging follow-up. Gallbladder surgically absent. Aortic atherosclerosis.   Bilateral ovaries are prominent for age, similar prior without abnormal FDG avidity possibly within normal anatomic variation.   SKELETON: No focal hypermetabolic activity to suggest skeletal metastasis.   Incidental CT findings: Multilevel degenerative changes spine disease and mild multifocal degenerative joint. Stable non FDG avid sclerotic lesions in the spine.   IMPRESSION: 1. Decreased size and abnormal hypermetabolic activity in the  left supraclavicular, subpectoral and axillary lymph nodes consistent with treatment response.   2.  No hypermetabolic distant metastatic disease.   3. Similar prominent for age bilateral ovaries without abnormal FDG avidity, possibly within normal anatomic variation. Consider further evaluation with nonemergent pelvic ultrasound if not previously performed.   4.  Aortic Atherosclerosis (ICD10-I70.0).     Electronically Signed   By: Dahlia Bailiff M.D.   On: 04/24/2022 08:49   Recent Labs    10/20/21 0838 02/23/22 0953 04/28/22 0854  HGBA1C 6.6* 6.2* 5.9*     Results for orders placed or performed in visit on 04/28/22  POCT HgB A1C  Result Value Ref Range   Hemoglobin A1C 5.9 (A) 4.0 - 5.6 %  Assessment & Plan:   Problem List Items Addressed This Visit     Type 2 diabetes mellitus with other specified complication ([place]) - Primary    A1c 5.9 improved No hypoglycemia. Complications - peripheral neuropathy mostly due to chemotherapy toxicity, other including hyperlipidemia, GERD, , obesity, hypothyroidism, OSA - increases risk of future cardiovascular complications  OFF Metformin not needed Did not take Rybelsus  Plan:  1. Continue Metformin 2. Encourage improved lifestyle - low carb, low sugar diet, reduce portion size, continue improving regular exercise 3. Check CBG if needed 4. Continue ACEi, - refused restart Statin - due to myopathy      Relevant Orders   POCT HgB A1C (Completed)   Drug-induced myopathy    Secondary to statin Remains off therapy - reconsider alternative statin vs zetia next time       Aortic atherosclerosis (HCC)    Known vascular disease, on imaging No longer on statin therapy        No orders of the defined types were placed in this encounter.     Follow up plan: Return in about 6 months (around 10/28/2022) for 6 month fasting lab only then 1 week later Medicare Physical.  Future labs ordered for 10/2022  Nobie Putnam, [facility] 04/28/2022, 8:54 AM

## 2022-04-28 NOTE — Assessment & Plan Note (Signed)
Secondary to statin Remains off therapy - reconsider alternative statin vs zetia next time 

## 2022-04-28 NOTE — Assessment & Plan Note (Signed)
A1c 5.9 improved No hypoglycemia. Complications - peripheral neuropathy mostly due to chemotherapy toxicity, other including hyperlipidemia, GERD, , obesity, hypothyroidism, OSA - increases risk of future cardiovascular complications  OFF Metformin not needed Did not take Rybelsus  Plan:  1. Continue Metformin 2. Encourage improved lifestyle - low carb, low sugar diet, reduce portion size, continue improving regular exercise 3. Check CBG if needed 4. Continue ACEi, - refused restart Statin - due to myopathy

## 2022-04-28 NOTE — Assessment & Plan Note (Signed)
Known vascular disease, on imaging No longer on statin therapy 

## 2022-05-05 NOTE — Progress Notes (Signed)
[place]  [phone]  [phone]     JEARLINE HIRSCHHORN DOB: [birth year]  MR#: [id number]  BDZ#:[id number]  Patient Care Team: [name], DO as PCP - General (Family Medicine) Pieter Partridge, DO as Consulting Physician (Neurology) Lloyd Huger, MD as Consulting Physician (Oncology)  CHIEF COMPLAINT: Recurrent, metastatic ER/PR, HER2 negative invasive carcinoma of the breast.    INTERVAL HISTORY: Patient returns to clinic today for further evaluation and continuation of Kisqali and fulvestrant.  She continues to feel well and remains asymptomatic.  She is tolerating her treatments without significant side effects.  She is now nearly fully recovered from her knee replacement.  She has no new neurologic complaints.  She denies any fevers.  She has a good appetite and denies weight loss.  She has no chest pain, shortness of breath, cough, or hemoptysis.  She denies any nausea, vomiting, or constipation. She has no urinary complaints.  Patient offers no further specific complaints today.  REVIEW OF SYSTEMS:   Review of Systems  Constitutional: Negative.  Negative for fever, malaise/fatigue and weight loss.  Respiratory: Negative.  Negative for cough, hemoptysis and shortness of breath.   Cardiovascular: Negative.  Negative for chest pain and leg swelling.  Gastrointestinal: Negative.  Negative for abdominal pain.  Genitourinary: Negative.  Negative for dysuria.  Musculoskeletal: Negative.  Negative for joint pain and myalgias.  Skin: Negative.  Negative for rash.  Neurological: Negative.  Negative for dizziness, sensory change, focal weakness, weakness and headaches.  Psychiatric/Behavioral: Negative.  The patient is not nervous/anxious.     As per HPI. Otherwise, a complete review of systems is negative.  ONCOLOGY HISTORY: Oncology History Overview Note  1. Bilateral carcinoma of breast, February, 2015. Right breast status post  radical mastectomy. T1 C. N0M0 (isolated tumor cells in one lymph node) multifocal invasive cancer. Stage IC, Left breast, Status post radical mastectomy, T2, N1, M0 tumor. Stage II, RIght breast. Both tumors are estrogen receptor positive.  Progesterone receptor positive.  HER-2 receptor negative by FISH. Negative for BRCA mutation.  2. Started on Adriamycin and Cytoxan on March 01, 2014. Last cycle of Cytoxan and Adriamycin on May 01, 2014. Patient has finished last chemotherapy on September 9. (12 th dose was omitted because of neuropathy) 3.  Taking tamoxifen.  Patient had a poor tolerance to letrozole.(October, 2015) 4.  Patient is taking letrozole since November of 2015 5.  May, 2016 Letrozole has been put on hold because of bony pains 6.  Started on Aromasin from July of 2016    History of bilateral breast cancer (Resolved)  11/29/2013 Initial Diagnosis   Breast cancer bilateral, multifocal ER/PR pos, Her 2 neg,.     PAST MEDICAL HISTORY: Past Medical History:  Diagnosis Date   Anemia    Arthritis not really diagnosis but have pain   B12 deficiency 07/23/2016   Will check levels to determine if injections are needed again. Await results. Recent CBC at On[facility]   Blood transfusion without reported diagnosis 1983 had a miscarriage/dnc   Cancer ([place]) 12/30/2013   Multifocal disease: pT1c,m, N0(isolated tumor cells) Er/ PR positive, Her 2 negative.   Cancer ([place]) 01/09/2014   Invasive lobular carcinoma, 2.2 cm, T2,N1 ER/PR positive, HER-2/neu negative   Cataract 2014?   Centrilobular emphysema ([place]) 34/19/6222   Complication of anesthesia 03/06/2022   Prolonged metabolism of Rocuronium during case.   Diabetes mellitus without complication (HCC)    Diffuse cystic mastopathy  Erosive esophagitis    Essential hypertension, benign 09/24/2017   Family history of malignant neoplasm of gastrointestinal tract 2012   Fractured elbow 08/28/2014   Gastroesophageal reflux disease     Hx of metabolic acidosis with increased anion gap    Increased heart rate    Multiple sclerosis (HCC)    Neuromuscular disorder (HCC)    neuropathy in bil feet - left is worse.   Obesity, unspecified    Peripheral neuropathy    Personal history of tobacco use, presenting hazards to health    Restless leg syndrome    Sleep apnea    uses CPAP   Special screening for malignant neoplasms, colon     PAST SURGICAL HISTORY: Past Surgical History:  Procedure Laterality Date   BREAST BIOPSY Right 1973,2001   BREAST BIOPSY Right 11-07-13   INVASIVE MAMMARY CARCINOMA , ER/PR positive Her2 negative   BREAST BIOPSY Left 11-27-13   invasive lobular and DCIS   BREAST SURGERY Bilateral 01/09/14   mastectomy   CHOLECYSTECTOMY     COLONOSCOPY  2010   Dr. Jamal Collin   COLONOSCOPY WITH PROPOFOL N/A 06/11/2015   Procedure: COLONOSCOPY WITH PROPOFOL;  Surgeon: Christene Lye, MD;  Location: [facility] ENDOSCOPY;  Service: Endoscopy;  Laterality: N/A;   COLONOSCOPY WITH PROPOFOL N/A 12/23/2021   Procedure: COLONOSCOPY WITH PROPOFOL;  Surgeon: Lin Landsman, MD;  Location: [facility] ENDOSCOPY;  Service: Gastroenterology;  Laterality: N/A;   DILATATION & CURETTAGE/HYSTEROSCOPY WITH MYOSURE N/A 07/12/2018   Procedure: DILATATION & CURETTAGE/HYSTEROSCOPY WITH MYOSURE WITH ENDOMETRIAL POLYPECTOMY;  Surgeon: Will Bonnet, MD;  Location: [facility] ORS;  Service: Gynecology;  Laterality: N/A;   DILATION AND CURETTAGE OF UTERUS  1983   ESOPHAGOGASTRODUODENOSCOPY N/A 12/23/2021   Procedure: ESOPHAGOGASTRODUODENOSCOPY (EGD);  Surgeon: Lin Landsman, MD;  Location: [facility] ENDOSCOPY;  Service: Gastroenterology;  Laterality: N/A;   JOINT REPLACEMENT  11/23/2015   KNEE ARTHROPLASTY Right 03/06/2022   Procedure: COMPUTER ASSISTED TOTAL KNEE ARTHROPLASTY;  Surgeon: Dereck Leep, MD;  Location: [facility] ORS;  Service: Orthopedics;  Laterality: Right;   POLYPECTOMY  1989   PORTA CATH REMOVAL     PORTACATH PLACEMENT  2015    SPINE SURGERY  09/14/14   Ruptured disk L4- L5   TOTAL KNEE ARTHROPLASTY Left 11/22/2015   Procedure: TOTAL KNEE ARTHROPLASTY;  Surgeon: Earlie Server, MD;  Location: [place];  Service: Orthopedics;  Laterality: Left;   TUBAL LIGATION     WISDOM TOOTH EXTRACTION  2006    FAMILY HISTORY Family History  Problem Relation Age of Onset   Cancer Mother        lung   COPD Mother    Cancer Father        colon, stomach   Diabetes Maternal Grandmother    Hypertension Sister    Rectal cancer Paternal Uncle    Rectal cancer Paternal Uncle     GYNECOLOGIC HISTORY:  Patient's last menstrual period was 12/22/1999 (approximate).     ADVANCED DIRECTIVES:    HEALTH MAINTENANCE: Social History   Tobacco Use   Smoking status: Former    Packs/day: 1.00    Years: 30.00    Total pack years: 30.00    Types: Cigarettes    Quit date: 12/11/2013    Years since quitting: 8.4   Smokeless tobacco: Former  Scientific laboratory technician Use: Never used  Substance Use Topics   Alcohol use: Not Currently   Drug use: No     Colonoscopy:  PAP:  Bone  density:  Mammogram:  No Known Allergies  Current Outpatient Medications  Medication Sig Dispense Refill   acetaminophen (TYLENOL) 500 MG tablet Take 1,000 mg by mouth 2 (two) times daily as needed for moderate pain or headache.     baclofen (LIORESAL) 10 MG tablet Take 1 tablet (10 mg total) by mouth 3 (three) times daily as needed for muscle spasms. (Patient taking differently: Take 10 mg by mouth 2 (two) times daily.) 1080 tablet 0   calcium carbonate (OSCAL) 1500 (600 Ca) MG TABS tablet Take by mouth 2 (two) times daily with a meal.     diclofenac Sodium (VOLTAREN) 1 % GEL Apply 2 g topically 3 (three) times daily as needed. 100 g 2   famotidine (PEPCID) 20 MG tablet TAKE 1 TABLET BY MOUTH TWICE A DAY BEFORE MEALS 180 tablet 1   fluticasone (FLONASE) 50 MCG/ACT nasal spray PLACE 2 SPRAYS INTO BOTH NOSTRILS AT BEDTIME. 48 g 1   fulvestrant (FASLODEX)  250 MG/5ML injection Inject 500 mg into the muscle every 30 (thirty) days. One injection each buttock over 1-2 minutes. Warm prior to use.     Krill Oil 1000 MG CAPS Take 1,000 mg by mouth daily.     lisinopril (ZESTRIL) 5 MG tablet TAKE 1 TABLET BY MOUTH EVERY DAY IN THE EVENING 90 tablet 3   loperamide (IMODIUM A-D) 2 MG tablet Take 2 tablets (4 mg total) by mouth 4 (four) times daily as needed for diarrhea or loose stools. 30 tablet 0   metFORMIN (GLUCOPHAGE) 500 MG tablet TAKE 1 TABLET BY MOUTH 2 TIMES DAILY WITH A MEAL. 180 tablet 2   neomycin-polymyxin-dexamethasone (MAXITROL) 0.1 % ophthalmic suspension Place 1 drop into the right eye 4 (four) times daily.     polyethylene glycol powder (GLYCOLAX/MIRALAX) powder Take 17 g by mouth 2 (two) times daily as needed for moderate constipation. 3350 g 1   prochlorperazine (COMPAZINE) 10 MG tablet Take 1 tablet (10 mg total) by mouth every 6 (six) hours as needed for nausea or vomiting. 20 tablet 1   ribociclib succ (KISQALI, 600 MG DOSE,) 200 MG Therapy Pack Take 3 tablets (600 mg total) by mouth daily. Take for 21 days on, 7 days off, repeat every 28 days. 63 tablet 3   sodium chloride (OCEAN) 0.65 % SOLN nasal spray Place 1 spray into both nostrils as needed for congestion.     traMADol (ULTRAM) 50 MG tablet Take 1-2 tablets (50-100 mg total) by mouth every 4 (four) hours as needed for moderate pain. 30 tablet 0   traZODone (DESYREL) 50 MG tablet TAKE 1 TABLET BY MOUTH AT BEDTIME AS NEEDED FOR SLEEP. 90 tablet 0   venlafaxine XR (EFFEXOR-XR) 75 MG 24 hr capsule Take 1 capsule (75 mg total) by mouth daily with breakfast. 360 capsule 0   vitamin B-12 (CYANOCOBALAMIN) 1000 MCG tablet Take 2,500 mcg by mouth every Monday, Wednesday, and Friday.     Vitamin D, Cholecalciferol, 50 MCG (2000 UT) CAPS Take 2,000 Units by mouth every Monday, Wednesday, and Friday.     omeprazole (PRILOSEC) 40 MG capsule TAKE 1 CAPSULE (40 MG TOTAL) BY MOUTH 2 (TWO) TIMES  DAILY BEFORE A MEAL. 180 capsule 1   No current facility-administered medications for this visit.    OBJECTIVE: BP 126/72   Pulse 92   Temp (!) 97 F (36.1 C) (Tympanic)   Resp 18   Wt 182 lb 6.4 oz (82.7 kg)   LMP 12/22/1999 (Approximate)   BMI  31.31 kg/m    Body mass index is 31.31 kg/m.    ECOG FS:0 - Asymptomatic  General: Well-developed, well-nourished, no acute distress. Eyes: Pink conjunctiva, anicteric sclera. HEENT: Normocephalic, moist mucous membranes. Breast: Bilateral mastectomy. Lungs: No audible wheezing or coughing. Heart: Regular rate and rhythm. Abdomen: Soft, nontender, no obvious distention. Musculoskeletal: No edema, cyanosis, or clubbing. Neuro: Alert, answering all questions appropriately. Cranial nerves grossly intact. Skin: No rashes or petechiae noted. Psych: Normal affect.  LAB RESULTS:  Orders Only on 05/07/2022  Component Date Value Ref Range Status   WBC 05/07/2022 5.5  4.0 - 10.5 K/uL Final   RBC 05/07/2022 2.54 (L)  3.87 - 5.11 MIL/uL Final   Hemoglobin 05/07/2022 9.0 (L)  12.0 - 15.0 g/dL Final   HCT 05/07/2022 27.2 (L)  36.0 - 46.0 % Final   MCV 05/07/2022 107.1 (H)  80.0 - 100.0 fL Final   MCH 05/07/2022 35.4 (H)  26.0 - 34.0 pg Final   MCHC 05/07/2022 33.1  30.0 - 36.0 g/dL Final   RDW 05/07/2022 16.6 (H)  11.5 - 15.5 % Final   Platelets 05/07/2022 201  150 - 400 K/uL Final   nRBC 05/07/2022 0.0  0.0 - 0.2 % Final   Neutrophils Relative % 05/07/2022 46  % Final   Neutro Abs 05/07/2022 2.6  1.7 - 7.7 K/uL Final   Lymphocytes Relative 05/07/2022 35  % Final   Lymphs Abs 05/07/2022 1.9  0.7 - 4.0 K/uL Final   Monocytes Relative 05/07/2022 15  % Final   Monocytes Absolute 05/07/2022 0.8  0.1 - 1.0 K/uL Final   Eosinophils Relative 05/07/2022 2  % Final   Eosinophils Absolute 05/07/2022 0.1  0.0 - 0.5 K/uL Final   Basophils Relative 05/07/2022 2  % Final   Basophils Absolute 05/07/2022 0.1  0.0 - 0.1 K/uL Final   Immature  Granulocytes 05/07/2022 0  % Final   Abs Immature Granulocytes 05/07/2022 0.01  0.00 - 0.07 K/uL Final   Performed at [facility], [address] [place], [place] [address]   Sodium 05/07/2022 142  135 - 145 mmol/L Final   Potassium 05/07/2022 4.3  3.5 - 5.1 mmol/L Final   Chloride 05/07/2022 103  98 - 111 mmol/L Final   CO2 05/07/2022 29  22 - 32 mmol/L Final   Glucose, Bld 05/07/2022 114 (H)  70 - 99 mg/dL Final   Glucose reference range applies only to samples taken after fasting for at least 8 hours.   BUN 05/07/2022 18  8 - 23 mg/dL Final   Creatinine, Ser 05/07/2022 1.41 (H)  0.44 - 1.00 mg/dL Final   Calcium 05/07/2022 9.2  8.9 - 10.3 mg/dL Final   Total Protein 05/07/2022 7.8  6.5 - 8.1 g/dL Final   Albumin 05/07/2022 4.1  3.5 - 5.0 g/dL Final   AST 05/07/2022 24  15 - 41 U/L Final   ALT 05/07/2022 16  0 - 44 U/L Final   Alkaline Phosphatase 05/07/2022 71  38 - 126 U/L Final   Total Bilirubin 05/07/2022 0.5  0.3 - 1.2 mg/dL Final   GFR, Estimated 05/07/2022 40 (L)  >60 mL/min Final   Comment: (NOTE) Calculated using the CKD-EPI Creatinine Equation (2021)    Anion gap 05/07/2022 10  5 - 15 Final   Performed at [facility], [place], [place] [address]   Magnesium 05/07/2022 1.7  1.7 - 2.4 mg/dL Final   Performed at [facility], [address] [place], [place] [address]   CA 27.29 05/07/2022 113.9 (H)  0.0 - 38.6 U/mL Final   Comment: (NOTE) Siemens Centaur Immunochemiluminometric Methodology [facility]) Values obtained with different assay methods or kits cannot be used interchangeably. Results cannot be interpreted as absolute evidence of the presence or absence of malignant disease. Performed At: [facility] [place], [place] [id number] Rush Farmer MD IO:[id number]     STUDIES: No results found.  ONCOLOGY HISTORY:  Bilateral adenocarcinoma of the breast. Patient is status post bilateral mastectomy in  February 2015. She also received adjuvant chemotherapy with Adriamycin, Cytoxan, and Taxol completing treatment in approximately October 2015. She then initiated letrozole, but could not tolerate secondary to leg pain and was switched to Aromasin.  Patient then switched to tamoxifen in October 2019 secondary to cost.  Patient subsequently discontinued tamoxifen and did not complete the recommended 5 years of treatment.   ASSESSMENT: Recurrent, metastatic ER/PR, HER2 negative invasive carcinoma of the breast.   PLAN:    1. Recurrent, metastatic ER/PR, HER2 negative invasive carcinoma of the breast: See oncology history as above.  Biopsy confirmed recurrent disease.  PET scan results from December 26, 2021 reviewed independently with hypermetabolic left supraclavicular, subpectoral, and axillary lymphadenopathy consistent with nodal recurrence of patient's history of breast cancer.  No distant metastases were identified.  Repeat PET scan on April 23, 2022 reviewed independently and reported as above with significant improvement of patient's disease burden.  Her initial CA 27-29 was reported 260.1, this is now 113.9. Continue Kisqali 600 mg daily for 21 days with 7 days off.  Proceed with monthly fulvestrant today.  Return to clinic in 4 weeks for further evaluation by clinical pharmacy and continuation of treatment.  Patient will then return to clinic in 8 weeks for further evaluation by MD and continuation of treatment.   2.  Osteopenia: Patient's last bone mineral density was on August 01, 2016 and revealed a T-score of -1.2. Continue monitoring bone mineral density by PCP. 3.  Multiple sclerosis: Chronic.  Continue evaluation and treatment per neurology.  4.  Esophageal discomfort/mild dysphagia: Patient does not complain of this today.  Recent colonoscopy and EGD did not reveal any significant pathology. 5.  Renal insufficiency: Chronic and unchanged.  Patient's GFR remains greater than 30.  Kisqali needs  to be dose reduced if GFR falls below 30. 6.  Anemia: Hemoglobin mildly improved to 9.0.  Monitor.   7.  Diarrhea: Patient does not complain of this today.  Continue Imodium as needed. 8.  Hypomagnesia: Resolved. 9.  Right knee replacement: Continue follow-up with orthopedics as indicated.     Patient expressed understanding and was in agreement with this plan. She also understands that She can call clinic at any time with any questions, concerns, or complaints.   Breast cancer bilateral, multifocal ER/PR pos, Her 2 neg,.   Staging form: Breast, AJCC 7th Edition     Clinical: Stage IIB (T2, N1, M0) - Signed by Forest Gleason, MD on 04/22/2015   Lloyd Huger, MD   05/08/2022 9:28 AM

## 2022-05-06 DIAGNOSIS — H16041 Marginal corneal ulcer, right eye: Secondary | ICD-10-CM | POA: Diagnosis not present

## 2022-05-07 ENCOUNTER — Inpatient Hospital Stay: Payer: Medicare Other | Admitting: Pharmacist

## 2022-05-07 ENCOUNTER — Inpatient Hospital Stay: Payer: Medicare Other | Attending: Oncology

## 2022-05-07 ENCOUNTER — Inpatient Hospital Stay: Payer: Medicare Other

## 2022-05-07 ENCOUNTER — Inpatient Hospital Stay (HOSPITAL_BASED_OUTPATIENT_CLINIC_OR_DEPARTMENT_OTHER): Payer: Medicare Other | Admitting: Oncology

## 2022-05-07 ENCOUNTER — Other Ambulatory Visit: Payer: Self-pay

## 2022-05-07 ENCOUNTER — Encounter: Payer: Self-pay | Admitting: Oncology

## 2022-05-07 VITALS — BP 126/72 | HR 92 | Temp 97.0°F | Resp 18 | Wt 182.4 lb

## 2022-05-07 DIAGNOSIS — C50919 Malignant neoplasm of unspecified site of unspecified female breast: Secondary | ICD-10-CM

## 2022-05-07 DIAGNOSIS — Z800 Family history of malignant neoplasm of digestive organs: Secondary | ICD-10-CM | POA: Insufficient documentation

## 2022-05-07 DIAGNOSIS — Z9013 Acquired absence of bilateral breasts and nipples: Secondary | ICD-10-CM | POA: Insufficient documentation

## 2022-05-07 DIAGNOSIS — R1310 Dysphagia, unspecified: Secondary | ICD-10-CM | POA: Insufficient documentation

## 2022-05-07 DIAGNOSIS — Z79811 Long term (current) use of aromatase inhibitors: Secondary | ICD-10-CM | POA: Diagnosis not present

## 2022-05-07 DIAGNOSIS — I10 Essential (primary) hypertension: Secondary | ICD-10-CM | POA: Insufficient documentation

## 2022-05-07 DIAGNOSIS — Z833 Family history of diabetes mellitus: Secondary | ICD-10-CM | POA: Diagnosis not present

## 2022-05-07 DIAGNOSIS — M8580 Other specified disorders of bone density and structure, unspecified site: Secondary | ICD-10-CM | POA: Diagnosis not present

## 2022-05-07 DIAGNOSIS — Z853 Personal history of malignant neoplasm of breast: Secondary | ICD-10-CM | POA: Diagnosis not present

## 2022-05-07 DIAGNOSIS — Z9049 Acquired absence of other specified parts of digestive tract: Secondary | ICD-10-CM | POA: Diagnosis not present

## 2022-05-07 DIAGNOSIS — E119 Type 2 diabetes mellitus without complications: Secondary | ICD-10-CM | POA: Insufficient documentation

## 2022-05-07 DIAGNOSIS — N289 Disorder of kidney and ureter, unspecified: Secondary | ICD-10-CM | POA: Diagnosis not present

## 2022-05-07 DIAGNOSIS — Z6831 Body mass index (BMI) 31.0-31.9, adult: Secondary | ICD-10-CM | POA: Diagnosis not present

## 2022-05-07 DIAGNOSIS — Z8249 Family history of ischemic heart disease and other diseases of the circulatory system: Secondary | ICD-10-CM | POA: Insufficient documentation

## 2022-05-07 DIAGNOSIS — Z801 Family history of malignant neoplasm of trachea, bronchus and lung: Secondary | ICD-10-CM | POA: Insufficient documentation

## 2022-05-07 DIAGNOSIS — G35 Multiple sclerosis: Secondary | ICD-10-CM | POA: Insufficient documentation

## 2022-05-07 DIAGNOSIS — Z836 Family history of other diseases of the respiratory system: Secondary | ICD-10-CM | POA: Insufficient documentation

## 2022-05-07 DIAGNOSIS — Z79899 Other long term (current) drug therapy: Secondary | ICD-10-CM | POA: Insufficient documentation

## 2022-05-07 DIAGNOSIS — E669 Obesity, unspecified: Secondary | ICD-10-CM | POA: Insufficient documentation

## 2022-05-07 LAB — COMPREHENSIVE METABOLIC PANEL
ALT: 16 U/L (ref 0–44)
AST: 24 U/L (ref 15–41)
Albumin: 4.1 g/dL (ref 3.5–5.0)
Alkaline Phosphatase: 71 U/L (ref 38–126)
Anion gap: 10 (ref 5–15)
BUN: 18 mg/dL (ref 8–23)
CO2: 29 mmol/L (ref 22–32)
Calcium: 9.2 mg/dL (ref 8.9–10.3)
Chloride: 103 mmol/L (ref 98–111)
Creatinine, Ser: 1.41 mg/dL — ABNORMAL HIGH (ref 0.44–1.00)
GFR, Estimated: 40 mL/min — ABNORMAL LOW (ref >60)
Glucose, Bld: 114 mg/dL — ABNORMAL HIGH (ref 70–99)
Potassium: 4.3 mmol/L (ref 3.5–5.1)
Sodium: 142 mmol/L (ref 135–145)
Total Bilirubin: 0.5 mg/dL (ref 0.3–1.2)
Total Protein: 7.8 g/dL (ref 6.5–8.1)

## 2022-05-07 LAB — CBC WITH DIFFERENTIAL/PLATELET
Abs Immature Granulocytes: 0.01 10*3/uL (ref 0.00–0.07)
Basophils Absolute: 0.1 10*3/uL (ref 0.0–0.1)
Basophils Relative: 2 %
Eosinophils Absolute: 0.1 10*3/uL (ref 0.0–0.5)
Eosinophils Relative: 2 %
HCT: 27.2 % — ABNORMAL LOW (ref 36.0–46.0)
Hemoglobin: 9 g/dL — ABNORMAL LOW (ref 12.0–15.0)
Immature Granulocytes: 0 %
Lymphocytes Relative: 35 %
Lymphs Abs: 1.9 10*3/uL (ref 0.7–4.0)
MCH: 35.4 pg — ABNORMAL HIGH (ref 26.0–34.0)
MCHC: 33.1 g/dL (ref 30.0–36.0)
MCV: 107.1 fL — ABNORMAL HIGH (ref 80.0–100.0)
Monocytes Absolute: 0.8 10*3/uL (ref 0.1–1.0)
Monocytes Relative: 15 %
Neutro Abs: 2.6 10*3/uL (ref 1.7–7.7)
Neutrophils Relative %: 46 %
Platelets: 201 10*3/uL (ref 150–400)
RBC: 2.54 MIL/uL — ABNORMAL LOW (ref 3.87–5.11)
RDW: 16.6 % — ABNORMAL HIGH (ref 11.5–15.5)
WBC: 5.5 10*3/uL (ref 4.0–10.5)
nRBC: 0 % (ref 0.0–0.2)

## 2022-05-07 LAB — MAGNESIUM: Magnesium: 1.7 mg/dL (ref 1.7–2.4)

## 2022-05-07 MED ORDER — FULVESTRANT 250 MG/5ML IM SOSY
500.0000 mg | PREFILLED_SYRINGE | Freq: Once | INTRAMUSCULAR | Status: AC
  Administered 2022-05-07: 500 mg via INTRAMUSCULAR
  Filled 2022-05-07: qty 10

## 2022-05-07 NOTE — Progress Notes (Signed)
Patient here today for follow up regarding breast cancer. Patient reports eye infection, started eye drops.

## 2022-05-07 NOTE — Progress Notes (Signed)
[facility]  [phone] [phone]  Patient Care Team: [name], DO as PCP - General (Family Medicine) Christene Lye, MD (General Surgery) Forest Gleason, MD (Oncology) Pieter Partridge, DO as Consulting Physician (Neurology) Lloyd Huger, MD as Consulting Physician (Oncology)   Name of the patient: Cassidy Norris  [id number]  May 05, 1951   Date of visit: 05/07/22  HPI: Patient is a 71 y.o. female with recurrent breast cancer, ER/PR positive/HER2 negative. Currently treating with Kisqali (ribociclib) and fulvestrant. She started ribociclib on 01/08/22.  Reason for Consult: Oral chemotherapy follow-up for ribociclib therapy.   PAST MEDICAL HISTORY: Past Medical History:  Diagnosis Date   Anemia    Arthritis not really diagnosis but have pain   B12 deficiency 07/23/2016   Will check levels to determine if injections are needed again. Await results. Recent CBC at Onc [facility]   Blood transfusion without reported diagnosis 1983 had a miscarriage/dnc   Cancer ([place]) 12/30/2013   Multifocal disease: pT1c,m, N0(isolated tumor cells) Er/ PR positive, Her 2 negative.   Cancer ([place]) 01/09/2014   Invasive lobular carcinoma, 2.2 cm, T2,N1 ER/PR positive, HER-2/neu negative   Cataract 2014?   Centrilobular emphysema ([place]) 28/78/6767   Complication of anesthesia 03/06/2022   Prolonged metabolism of Rocuronium during case.   Diabetes mellitus without complication ([place])    Diffuse cystic mastopathy    Erosive esophagitis    Essential hypertension, benign 09/24/2017   Family history of malignant neoplasm of gastrointestinal tract 2012   Fractured elbow 08/28/2014   Gastroesophageal reflux disease    Hx of metabolic acidosis with increased anion gap    Increased heart rate    Multiple sclerosis (HCC)    Neuromuscular disorder (HCC)    neuropathy in bil feet - left is worse.   Obesity, unspecified     Peripheral neuropathy    Personal history of tobacco use, presenting hazards to health    Restless leg syndrome    Sleep apnea    uses CPAP   Special screening for malignant neoplasms, colon     HEMATOLOGY/ONCOLOGY HISTORY:  Oncology History Overview Note  1. Bilateral carcinoma of breast, February, 2015. Right breast status post radical mastectomy. T1 C. N0M0 (isolated tumor cells in one lymph node) multifocal invasive cancer. Stage IC, Left breast, Status post radical mastectomy, T2, N1, M0 tumor. Stage II, RIght breast. Both tumors are estrogen receptor positive.  Progesterone receptor positive.  HER-2 receptor negative by FISH. Negative for BRCA mutation.  2. Started on Adriamycin and Cytoxan on March 01, 2014. Last cycle of Cytoxan and Adriamycin on May 01, 2014. Patient has finished last chemotherapy on September 9. (12 th dose was omitted because of neuropathy) 3.  Taking tamoxifen.  Patient had a poor tolerance to letrozole.(October, 2015) 4.  Patient is taking letrozole since November of 2015 5.  May, 2016 Letrozole has been put on hold because of bony pains 6.  Started on Aromasin from July of 2016    History of bilateral breast cancer (Resolved)  11/29/2013 Initial Diagnosis   Breast cancer bilateral, multifocal ER/PR pos, Her 2 neg,.     ALLERGIES:  has No Known Allergies.  MEDICATIONS:  Current Outpatient Medications  Medication Sig Dispense Refill   acetaminophen (TYLENOL) 500 MG tablet Take 1,000 mg by mouth 2 (two) times daily as needed for moderate pain or headache.     baclofen (LIORESAL) 10 MG tablet Take 1 tablet (10 mg total) by mouth  3 (three) times daily as needed for muscle spasms. (Patient taking differently: Take 10 mg by mouth 2 (two) times daily.) 1080 tablet 0   calcium carbonate (OSCAL) 1500 (600 Ca) MG TABS tablet Take by mouth 2 (two) times daily with a meal.     diclofenac Sodium (VOLTAREN) 1 % GEL Apply 2 g topically 3 (three) times daily as  needed. 100 g 2   famotidine (PEPCID) 20 MG tablet TAKE 1 TABLET BY MOUTH TWICE A DAY BEFORE MEALS 180 tablet 1   fluticasone (FLONASE) 50 MCG/ACT nasal spray PLACE 2 SPRAYS INTO BOTH NOSTRILS AT BEDTIME. 48 g 1   fulvestrant (FASLODEX) 250 MG/5ML injection Inject 500 mg into the muscle every 30 (thirty) days. One injection each buttock over 1-2 minutes. Warm prior to use.     Krill Oil 1000 MG CAPS Take 1,000 mg by mouth daily.     lisinopril (ZESTRIL) 5 MG tablet TAKE 1 TABLET BY MOUTH EVERY DAY IN THE EVENING 90 tablet 3   loperamide (IMODIUM A-D) 2 MG tablet Take 2 tablets (4 mg total) by mouth 4 (four) times daily as needed for diarrhea or loose stools. 30 tablet 0   metFORMIN (GLUCOPHAGE) 500 MG tablet TAKE 1 TABLET BY MOUTH 2 TIMES DAILY WITH A MEAL. 180 tablet 2   neomycin-polymyxin-dexamethasone (MAXITROL) 0.1 % ophthalmic suspension Place 1 drop into the right eye 4 (four) times daily.     omeprazole (PRILOSEC) 40 MG capsule TAKE 1 CAPSULE (40 MG TOTAL) BY MOUTH 2 (TWO) TIMES DAILY BEFORE A MEAL. 180 capsule 1   polyethylene glycol powder (GLYCOLAX/MIRALAX) powder Take 17 g by mouth 2 (two) times daily as needed for moderate constipation. 3350 g 1   prochlorperazine (COMPAZINE) 10 MG tablet Take 1 tablet (10 mg total) by mouth every 6 (six) hours as needed for nausea or vomiting. 20 tablet 1   ribociclib succ (KISQALI, 600 MG DOSE,) 200 MG Therapy Pack Take 3 tablets (600 mg total) by mouth daily. Take for 21 days on, 7 days off, repeat every 28 days. 63 tablet 3   sodium chloride (OCEAN) 0.65 % SOLN nasal spray Place 1 spray into both nostrils as needed for congestion.     traMADol (ULTRAM) 50 MG tablet Take 1-2 tablets (50-100 mg total) by mouth every 4 (four) hours as needed for moderate pain. 30 tablet 0   traZODone (DESYREL) 50 MG tablet TAKE 1 TABLET BY MOUTH AT BEDTIME AS NEEDED FOR SLEEP. 90 tablet 0   venlafaxine XR (EFFEXOR-XR) 75 MG 24 hr capsule Take 1 capsule (75 mg total) by  mouth daily with breakfast. 360 capsule 0   vitamin B-12 (CYANOCOBALAMIN) 1000 MCG tablet Take 2,500 mcg by mouth every Monday, Wednesday, and Friday.     Vitamin D, Cholecalciferol, 50 MCG (2000 UT) CAPS Take 2,000 Units by mouth every Monday, Wednesday, and Friday.     No current facility-administered medications for this visit.   Facility-Administered Medications Ordered in Other Visits  Medication Dose Route Frequency Provider Last Rate Last Admin   fulvestrant (FASLODEX) injection 500 mg  500 mg Intramuscular Once Lloyd Huger, MD        VITAL SIGNS: LMP 12/22/1999 (Approximate)  There were no vitals filed for this visit.  Estimated body mass index is 31.31 kg/m as calculated from the following:   Height as of 04/28/22: _0  (1.626 m).   Weight as of an earlier encounter on 05/07/22: 82.7 kg (182 lb 6.4 oz).  LABS:  CBC:    Component Value Date/Time   WBC 5.5 05/07/2022 1005   HGB 9.0 (L) 05/07/2022 1005   HGB 15.5 07/13/2017 0937   HCT 27.2 (L) 05/07/2022 1005   HCT 46.7 (H) 07/13/2017 0937   PLT 201 05/07/2022 1005   PLT 211 07/13/2017 0937   MCV 107.1 (H) 05/07/2022 1005   MCV 94 07/13/2017 0937   MCV 93 03/11/2015 1356   NEUTROABS 2.6 05/07/2022 1005   NEUTROABS 5.4 07/13/2017 0937   NEUTROABS 5.6 03/11/2015 1356   LYMPHSABS 1.9 05/07/2022 1005   LYMPHSABS 2.1 07/13/2017 0937   LYMPHSABS 2.3 03/11/2015 1356   MONOABS 0.8 05/07/2022 1005   MONOABS 1.0 (H) 03/11/2015 1356   EOSABS 0.1 05/07/2022 1005   EOSABS 0.2 07/13/2017 0937   EOSABS 0.3 03/11/2015 1356   BASOSABS 0.1 05/07/2022 1005   BASOSABS 0.0 07/13/2017 0937   BASOSABS 0.1 03/11/2015 1356   Comprehensive Metabolic Panel:    Component Value Date/Time   NA 142 05/07/2022 1005   NA 142 07/14/2019 1516   NA 139 03/11/2015 1356   K 4.3 05/07/2022 1005   K 3.4 (L) 03/11/2015 1356   CL 103 05/07/2022 1005   CL 103 03/11/2015 1356   CO2 29 05/07/2022 1005   CO2 28 03/11/2015 1356   BUN 18  05/07/2022 1005   BUN 12 07/14/2019 1516   BUN 13 03/11/2015 1356   CREATININE 1.41 (H) 05/07/2022 1005   CREATININE 0.85 10/20/2021 0838   GLUCOSE 114 (H) 05/07/2022 1005   GLUCOSE 118 (H) 03/11/2015 1356   CALCIUM 9.2 05/07/2022 1005   CALCIUM 9.2 03/11/2015 1356   AST 24 05/07/2022 1005   AST 28 03/11/2015 1356   ALT 16 05/07/2022 1005   ALT 32 03/11/2015 1356   ALKPHOS 71 05/07/2022 1005   ALKPHOS 68 03/11/2015 1356   BILITOT 0.5 05/07/2022 1005   BILITOT 0.4 07/14/2019 1516   BILITOT 0.7 03/11/2015 1356   PROT 7.8 05/07/2022 1005   PROT 6.9 07/14/2019 1516   PROT 6.9 03/11/2015 1356   ALBUMIN 4.1 05/07/2022 1005   ALBUMIN 4.5 07/14/2019 1516   ALBUMIN 4.2 03/11/2015 1356     Present during today's visit: patient only  Assessment and Plan: Continue ribociclib 600 mg 21 days on/7 days off.  CBC and CMP assessed Hgb improved since last visit. She is not feeling SOB or fatigued. Will continue to monitor. SCr decreased slightly since last visit, will keep monitoring.   Oral Chemotherapy Side Effect/Intolerance:  Diarrhea: mild, had a few instances of diarrhea, resolved after taking loperamide. No reported fatigue or nausea/vomitting  Other: Patient is recovering well from her knee surgery, increasing her activity/movement. She has completed physical therapy.  Oral Chemotherapy Adherence: no missed dose reported No patient barriers to medication adherence identified.   New medications: none reported  Medication Access Issues: no issues, refill for ribociclib sent to [facility] today  Patient expressed understanding and was in agreement with this plan. She also understands that She can call clinic at any time with any questions, concerns, or complaints.   Follow-up plan: RTC in 4 weeks  Thank you for allowing me to participate in the care of this very pleasant patient.   Time Total: 15 min  Visit consisted of counseling and education on dealing with  issues of symptom management in the setting of serious and potentially life-threatening illness.Greater than 50%  of this time was spent counseling and coordinating care related to the above assessment and  plan.  Signed by: Darl Pikes, PharmD, BCPS, Salley Slaughter, CPP Hematology/Oncology Clinical Pharmacist Practitioner [place]/DB/AP Oral [facility] [phone]  05/07/2022 11:28 AM

## 2022-05-08 ENCOUNTER — Encounter: Payer: Self-pay | Admitting: Oncology

## 2022-05-08 LAB — CANCER ANTIGEN 27.29: CA 27.29: 113.9 U/mL — ABNORMAL HIGH (ref 0.0–38.6)

## 2022-05-19 ENCOUNTER — Other Ambulatory Visit (HOSPITAL_COMMUNITY): Payer: Self-pay

## 2022-05-21 ENCOUNTER — Ambulatory Visit (INDEPENDENT_AMBULATORY_CARE_PROVIDER_SITE_OTHER): Payer: Medicare Other | Admitting: Family Medicine

## 2022-05-21 ENCOUNTER — Encounter: Payer: Self-pay | Admitting: Family Medicine

## 2022-05-21 VITALS — BP 145/65 | HR 98 | Ht 64.0 in | Wt 184.2 lb

## 2022-05-21 DIAGNOSIS — L239 Allergic contact dermatitis, unspecified cause: Secondary | ICD-10-CM | POA: Diagnosis not present

## 2022-05-21 DIAGNOSIS — L298 Other pruritus: Secondary | ICD-10-CM | POA: Diagnosis not present

## 2022-05-21 MED ORDER — PREDNISONE 10 MG PO TABS: ORAL_TABLET | ORAL | 0 refills | Status: DC

## 2022-05-21 MED ORDER — TRIAMCINOLONE ACETONIDE 0.1 % EX CREA: 1.0000 | TOPICAL_CREAM | Freq: Two times a day (BID) | CUTANEOUS | 1 refills | Status: DC

## 2022-05-21 NOTE — Progress Notes (Signed)
Subjective:    Patient ID: Cassidy Norris, female    DOB: 12-Jan-1951, 71 y.o.   MRN: [id number]  Cassidy Norris is a 71 y.o. female presenting on 05/21/2022 for Rash   HPI  Pruritic Erythematous Rash  She is on chemotherapy agent Kisqali since Feb 2023, has not had reaction. Her Oncology pharmacist said that unlikely to be the cause. No other med identified that would cause it except Amoxicillin course recently.  Possible exposure to poison ivy from puppy  She seems to have some improvement on topical OTC cortisone. Has some spread of rash but mostly itching and improving.  No other complication Denies pain or fever      05/21/2022   11:26 AM 04/28/2022    8:53 AM 10/27/2021    8:27 AM  Depression screen PHQ 2/9  Decreased Interest 0 0 0  Down, Depressed, Hopeless 0 0 0  PHQ - 2 Score 0 0 0  Altered sleeping '3 3 3  '$ Tired, decreased energy  0 2  Change in appetite 0 1 1  Feeling bad or failure about yourself  0 0 0  Trouble concentrating 0 0 0  Moving slowly or fidgety/restless 0 0 0  Suicidal thoughts 0 0 0  PHQ-9 Score '3 4 6  '$ Difficult doing work/chores Not difficult at all Not difficult at all Not difficult at all    Social History   Tobacco Use   Smoking status: Former    Packs/day: 1.00    Years: 30.00    Total pack years: 30.00    Types: Cigarettes    Quit date: 12/11/2013    Years since quitting: 8.4   Smokeless tobacco: Former  Scientific laboratory technician Use: Never used  Substance Use Topics   Alcohol use: Not Currently   Drug use: No    Review of Systems Per HPI unless specifically indicated above     Objective:    BP (!) 145/65   Pulse 98   Ht '5\' 4"'$  (1.626 m)   Wt 184 lb 3.2 oz (83.6 kg)   LMP 12/22/1999 (Approximate)   SpO2 100%   BMI 31.62 kg/m   Wt Readings from Last 3 Encounters:  05/21/22 184 lb 3.2 oz (83.6 kg)  05/07/22 182 lb 6.4 oz (82.7 kg)  04/28/22 180 lb (81.6 kg)    Physical Exam Vitals and nursing note reviewed.   Constitutional:      General: She is not in acute distress.    Appearance: Normal appearance. She is well-developed. She is not diaphoretic.     Comments: Well-appearing, comfortable, cooperative  HENT:     Head: Normocephalic and atraumatic.  Eyes:     General:        Right eye: No discharge.        Left eye: No discharge.     Conjunctiva/sclera: Conjunctivae normal.  Cardiovascular:     Rate and Rhythm: Normal rate.  Pulmonary:     Effort: Pulmonary effort is normal.  Skin:    General: Skin is warm and dry.     Findings: Rash present. No erythema.  Neurological:     Mental Status: She is alert and oriented to person, place, and time.  Psychiatric:        Mood and Affect: Mood normal.        Behavior: Behavior normal.        Thought Content: Thought content normal.     Comments: Well groomed, good eye contact,  normal speech and thoughts    Results for orders placed or performed in visit on 05/07/22  CBC with Differential/Platelet  Result Value Ref Range   WBC 5.5 4.0 - 10.5 K/uL   RBC 2.54 (L) 3.87 - 5.11 MIL/uL   Hemoglobin 9.0 (L) 12.0 - 15.0 g/dL   HCT 27.2 (L) 36.0 - 46.0 %   MCV 107.1 (H) 80.0 - 100.0 fL   MCH 35.4 (H) 26.0 - 34.0 pg   MCHC 33.1 30.0 - 36.0 g/dL   RDW 16.6 (H) 11.5 - 15.5 %   Platelets 201 150 - 400 K/uL   nRBC 0.0 0.0 - 0.2 %   Neutrophils Relative % 46 %   Neutro Abs 2.6 1.7 - 7.7 K/uL   Lymphocytes Relative 35 %   Lymphs Abs 1.9 0.7 - 4.0 K/uL   Monocytes Relative 15 %   Monocytes Absolute 0.8 0.1 - 1.0 K/uL   Eosinophils Relative 2 %   Eosinophils Absolute 0.1 0.0 - 0.5 K/uL   Basophils Relative 2 %   Basophils Absolute 0.1 0.0 - 0.1 K/uL   Immature Granulocytes 0 %   Abs Immature Granulocytes 0.01 0.00 - 0.07 K/uL  Comprehensive metabolic panel  Result Value Ref Range   Sodium 142 135 - 145 mmol/L   Potassium 4.3 3.5 - 5.1 mmol/L   Chloride 103 98 - 111 mmol/L   CO2 29 22 - 32 mmol/L   Glucose, Bld 114 (H) 70 - 99 mg/dL   BUN 18  8 - 23 mg/dL   Creatinine, Ser 1.41 (H) 0.44 - 1.00 mg/dL   Calcium 9.2 8.9 - 10.3 mg/dL   Total Protein 7.8 6.5 - 8.1 g/dL   Albumin 4.1 3.5 - 5.0 g/dL   AST 24 15 - 41 U/L   ALT 16 0 - 44 U/L   Alkaline Phosphatase 71 38 - 126 U/L   Total Bilirubin 0.5 0.3 - 1.2 mg/dL   GFR, Estimated 40 (L) >60 mL/min   Anion gap 10 5 - 15  Magnesium  Result Value Ref Range   Magnesium 1.7 1.7 - 2.4 mg/dL  Cancer antigen 27.29  Result Value Ref Range   CA 27.29 113.9 (H) 0.0 - 38.6 U/mL      Assessment & Plan:   Problem List Items Addressed This Visit   None Visit Diagnoses     Pruritic erythematous rash    -  Primary   Relevant Medications   predniSONE (DELTASONE) 10 MG tablet   triamcinolone cream (KENALOG) 0.1 %   Allergic dermatitis       Relevant Medications   triamcinolone cream (KENALOG) 0.1 %       Use Prednisone taper then after can use topical triamcinolone steroid for up to 2 weeks  If recurrent or not improve, then discuss with Dermatology  Most likely from puppy /environmental poison ivy OR maybe component of the Amoxicillin causing a raash.   Eczema / Rash guidelines per AVS   Meds ordered this encounter  Medications   predniSONE (DELTASONE) 10 MG tablet    Sig: Take 6 tabs with breakfast Day 1, 5 tabs Day 2, 4 tabs Day 3, 3 tabs Day 4, 2 tabs Day 5, 1 tab Day 6.    Dispense:  21 tablet    Refill:  0   triamcinolone cream (KENALOG) 0.1 %    Sig: Apply 1 Application topically 2 (two) times daily. For 2 weeks then may repeat as needed.    Dispense:  30 g    Refill:  1      Follow up plan: Return if symptoms worsen or fail to improve.   Nobie Putnam, [facility] 05/21/2022, 10:54 AM

## 2022-05-21 NOTE — Patient Instructions (Addendum)
Thank you for coming to the office today.  Use Prednisone taper then after can use topical triamcinolone steroid for up to 2 weeks  If recurrent or not improve, then discuss with Dermatology  Most likely from puppy /environmental poison ivy OR maybe component of the Amoxicillin causing a raash.   Use the topical steroid creams twice a day for up to 1 week, maximum duration of use per one flare is 10 to 14 days, then STOP using it and allow skin to recover. Caution with over-use may cause lightening of the skin.   Hydrocortisone on face only and the Triamcinolone / Kenalog on body only.  Tips to reduce Eczema Flares: For baths/showers, limit bathing to every other day if you can (max 1 x daily)  Use a gentle, unscented soap and lukewarm water (hot water is most irritating to skin) Never scrub skin with too much pressure, this causes more irritation. Pat skin dry, then leave it slightly damp. DO NOT scrub it dry. Apply steroid cream to skin and rub in all the way, wait 15 min, then apply a daily moisturizer (Vaseline, Eucerin, Aveeno). Continue daily moisturizer every day of the year (even after flare is resolved) - If you have eczema on hands or dry hands, recommend wearing any type of gloves overnight (cloth, fabric, or even nitrile/latex) to improve effect of topical moisturizer  If develops redness, honey colored crust oozing, drainage of pus, bleeding, or redness / swelling, pain, please return for re-evaluation, may have become infected after scratching.     Please schedule a Follow-up Appointment to: Return if symptoms worsen or fail to improve.  If you have any other questions or concerns, please feel free to call the office or send a message through [facility]. You may also schedule an earlier appointment if necessary.  Additionally, you may be receiving a survey about your experience at our office within a few days to 1 week by e-mail or mail. We value your feedback.  Nobie Putnam, DO [facility]

## 2022-06-04 ENCOUNTER — Inpatient Hospital Stay: Payer: Medicare Other | Attending: Oncology

## 2022-06-04 ENCOUNTER — Encounter: Payer: Self-pay | Admitting: Pharmacist

## 2022-06-04 ENCOUNTER — Inpatient Hospital Stay: Payer: Medicare Other | Admitting: Pharmacist

## 2022-06-04 ENCOUNTER — Inpatient Hospital Stay: Payer: Medicare Other

## 2022-06-04 VITALS — BP 119/70 | HR 82 | Temp 97.6°F | Resp 16 | Ht 64.0 in | Wt 185.4 lb

## 2022-06-04 DIAGNOSIS — Z853 Personal history of malignant neoplasm of breast: Secondary | ICD-10-CM | POA: Insufficient documentation

## 2022-06-04 DIAGNOSIS — C50919 Malignant neoplasm of unspecified site of unspecified female breast: Secondary | ICD-10-CM

## 2022-06-04 DIAGNOSIS — M8580 Other specified disorders of bone density and structure, unspecified site: Secondary | ICD-10-CM | POA: Insufficient documentation

## 2022-06-04 DIAGNOSIS — Z79899 Other long term (current) drug therapy: Secondary | ICD-10-CM | POA: Diagnosis not present

## 2022-06-04 LAB — CBC WITH DIFFERENTIAL/PLATELET
Abs Immature Granulocytes: 0.02 10*3/uL (ref 0.00–0.07)
Basophils Absolute: 0.1 10*3/uL (ref 0.0–0.1)
Basophils Relative: 2 %
Eosinophils Absolute: 0.1 10*3/uL (ref 0.0–0.5)
Eosinophils Relative: 2 %
HCT: 29.8 % — ABNORMAL LOW (ref 36.0–46.0)
Hemoglobin: 10 g/dL — ABNORMAL LOW (ref 12.0–15.0)
Immature Granulocytes: 0 %
Lymphocytes Relative: 30 %
Lymphs Abs: 1.8 10*3/uL (ref 0.7–4.0)
MCH: 36.5 pg — ABNORMAL HIGH (ref 26.0–34.0)
MCHC: 33.6 g/dL (ref 30.0–36.0)
MCV: 108.8 fL — ABNORMAL HIGH (ref 80.0–100.0)
Monocytes Absolute: 0.8 10*3/uL (ref 0.1–1.0)
Monocytes Relative: 13 %
Neutro Abs: 3.2 10*3/uL (ref 1.7–7.7)
Neutrophils Relative %: 53 %
Platelets: 179 10*3/uL (ref 150–400)
RBC: 2.74 MIL/uL — ABNORMAL LOW (ref 3.87–5.11)
RDW: 15.8 % — ABNORMAL HIGH (ref 11.5–15.5)
WBC: 6 10*3/uL (ref 4.0–10.5)
nRBC: 0 % (ref 0.0–0.2)

## 2022-06-04 LAB — COMPREHENSIVE METABOLIC PANEL
ALT: 18 U/L (ref 0–44)
AST: 26 U/L (ref 15–41)
Albumin: 3.9 g/dL (ref 3.5–5.0)
Alkaline Phosphatase: 71 U/L (ref 38–126)
Anion gap: 10 (ref 5–15)
BUN: 21 mg/dL (ref 8–23)
CO2: 26 mmol/L (ref 22–32)
Calcium: 9 mg/dL (ref 8.9–10.3)
Chloride: 106 mmol/L (ref 98–111)
Creatinine, Ser: 1.24 mg/dL — ABNORMAL HIGH (ref 0.44–1.00)
GFR, Estimated: 47 mL/min — ABNORMAL LOW (ref >60)
Glucose, Bld: 137 mg/dL — ABNORMAL HIGH (ref 70–99)
Potassium: 4.3 mmol/L (ref 3.5–5.1)
Sodium: 142 mmol/L (ref 135–145)
Total Bilirubin: 0.5 mg/dL (ref 0.3–1.2)
Total Protein: 7.3 g/dL (ref 6.5–8.1)

## 2022-06-04 LAB — MAGNESIUM: Magnesium: 2 mg/dL (ref 1.7–2.4)

## 2022-06-04 MED ORDER — FULVESTRANT 250 MG/5ML IM SOSY
500.0000 mg | PREFILLED_SYRINGE | Freq: Once | INTRAMUSCULAR | Status: AC
  Administered 2022-06-04: 500 mg via INTRAMUSCULAR
  Filled 2022-06-04: qty 10

## 2022-06-04 NOTE — Progress Notes (Signed)
[place]  [phone] [phone]  Patient Care Team: [name], DO as PCP - General (Family Medicine) Pieter Partridge, DO as Consulting Physician (Neurology) Lloyd Huger, MD as Consulting Physician (Oncology)   Name of the patient: Cassidy Norris  [id number]  02/09/1951   Date of visit: 06/04/22  HPI: Patient is a 71 y.o. female with recurrent breast cancer, ER/PR positive/HER2 negative. Currently treating with Kisqali (ribociclib) and fulvestrant. She started ribociclib on 01/08/22.  Reason for Consult: Oral chemotherapy follow-up for ribociclib therapy.   PAST MEDICAL HISTORY: Past Medical History:  Diagnosis Date   Anemia    Arthritis not really diagnosis but have pain   B12 deficiency 07/23/2016   Will check levels to determine if injections are needed again. Await results. Recent CBC at Onc [facility]NL   Blood transfusion without reported diagnosis 1983 had a miscarriage/dnc   Cancer ([place]) 12/30/2013   Multifocal disease: pT1c,m, N0(isolated tumor cells) Er/ PR positive, Her 2 negative.   Cancer ([place]) 01/09/2014   Invasive lobular carcinoma, 2.2 cm, T2,N1 ER/PR positive, HER-2/neu negative   Cataract 2014?   Centrilobular emphysema ([place]) 02/04/9457   Complication of anesthesia 03/06/2022   Prolonged metabolism of Rocuronium during case.   Diabetes mellitus without complication ([place])    Diffuse cystic mastopathy    Erosive esophagitis    Essential hypertension, benign 09/24/2017   Family history of malignant neoplasm of gastrointestinal tract 2012   Fractured elbow 08/28/2014   Gastroesophageal reflux disease    Hx of metabolic acidosis with increased anion gap    Increased heart rate    Multiple sclerosis (HCC)    Neuromuscular disorder (HCC)    neuropathy in bil feet - left is worse.   Obesity, unspecified    Peripheral neuropathy    Personal history of tobacco use, presenting hazards  to health    Restless leg syndrome    Sleep apnea    uses CPAP   Special screening for malignant neoplasms, colon     HEMATOLOGY/ONCOLOGY HISTORY:  Oncology History Overview Note  1. Bilateral carcinoma of breast, February, 2015. Right breast status post radical mastectomy. T1 C. N0M0 (isolated tumor cells in one lymph node) multifocal invasive cancer. Stage IC, Left breast, Status post radical mastectomy, T2, N1, M0 tumor. Stage II, RIght breast. Both tumors are estrogen receptor positive.  Progesterone receptor positive.  HER-2 receptor negative by FISH. Negative for BRCA mutation.  2. Started on Adriamycin and Cytoxan on March 01, 2014. Last cycle of Cytoxan and Adriamycin on May 01, 2014. Patient has finished last chemotherapy on September 9. (12 th dose was omitted because of neuropathy) 3.  Taking tamoxifen.  Patient had a poor tolerance to letrozole.(October, 2015) 4.  Patient is taking letrozole since November of 2015 5.  May, 2016 Letrozole has been put on hold because of bony pains 6.  Started on Aromasin from July of 2016    History of bilateral breast cancer (Resolved)  11/29/2013 Initial Diagnosis   Breast cancer bilateral, multifocal ER/PR pos, Her 2 neg,.     ALLERGIES:  has No Known Allergies.  MEDICATIONS:  Current Outpatient Medications  Medication Sig Dispense Refill   acetaminophen (TYLENOL) 500 MG tablet Take 1,000 mg by mouth 2 (two) times daily as needed for moderate pain or headache.     baclofen (LIORESAL) 10 MG tablet Take 1 tablet (10 mg total) by mouth 3 (three) times daily as needed for muscle spasms. (Patient  taking differently: Take 10 mg by mouth 2 (two) times daily.) 1080 tablet 0   calcium carbonate (OSCAL) 1500 (600 Ca) MG TABS tablet Take by mouth 2 (two) times daily with a meal.     diclofenac Sodium (VOLTAREN) 1 % GEL Apply 2 g topically 3 (three) times daily as needed. 100 g 2   famotidine (PEPCID) 20 MG tablet TAKE 1 TABLET BY MOUTH TWICE A  DAY BEFORE MEALS 180 tablet 1   fluticasone (FLONASE) 50 MCG/ACT nasal spray PLACE 2 SPRAYS INTO BOTH NOSTRILS AT BEDTIME. 48 g 1   fulvestrant (FASLODEX) 250 MG/5ML injection Inject 500 mg into the muscle every 30 (thirty) days. One injection each buttock over 1-2 minutes. Warm prior to use.     Krill Oil 1000 MG CAPS Take 1,000 mg by mouth daily.     lisinopril (ZESTRIL) 5 MG tablet TAKE 1 TABLET BY MOUTH EVERY DAY IN THE EVENING 90 tablet 3   loperamide (IMODIUM A-D) 2 MG tablet Take 2 tablets (4 mg total) by mouth 4 (four) times daily as needed for diarrhea or loose stools. 30 tablet 0   metFORMIN (GLUCOPHAGE) 500 MG tablet TAKE 1 TABLET BY MOUTH 2 TIMES DAILY WITH A MEAL. 180 tablet 2   omeprazole (PRILOSEC) 40 MG capsule TAKE 1 CAPSULE (40 MG TOTAL) BY MOUTH 2 (TWO) TIMES DAILY BEFORE A MEAL. 180 capsule 1   predniSONE (DELTASONE) 10 MG tablet Take 6 tabs with breakfast Day 1, 5 tabs Day 2, 4 tabs Day 3, 3 tabs Day 4, 2 tabs Day 5, 1 tab Day 6. 21 tablet 0   prochlorperazine (COMPAZINE) 10 MG tablet Take 1 tablet (10 mg total) by mouth every 6 (six) hours as needed for nausea or vomiting. 20 tablet 1   ribociclib succ (KISQALI, 600 MG DOSE,) 200 MG Therapy Pack Take 3 tablets (600 mg total) by mouth daily. Take for 21 days on, 7 days off, repeat every 28 days. 63 tablet 3   sodium chloride (OCEAN) 0.65 % SOLN nasal spray Place 1 spray into both nostrils as needed for congestion.     traMADol (ULTRAM) 50 MG tablet Take 1-2 tablets (50-100 mg total) by mouth every 4 (four) hours as needed for moderate pain. 30 tablet 0   traZODone (DESYREL) 50 MG tablet TAKE 1 TABLET BY MOUTH AT BEDTIME AS NEEDED FOR SLEEP. 90 tablet 0   triamcinolone cream (KENALOG) 0.1 % Apply 1 Application topically 2 (two) times daily. For 2 weeks then may repeat as needed. 30 g 1   venlafaxine XR (EFFEXOR-XR) 75 MG 24 hr capsule Take 1 capsule (75 mg total) by mouth daily with breakfast. 360 capsule 0   vitamin B-12  (CYANOCOBALAMIN) 1000 MCG tablet Take 2,500 mcg by mouth every Monday, Wednesday, and Friday.     Vitamin D, Cholecalciferol, 50 MCG (2000 UT) CAPS Take 2,000 Units by mouth every Monday, Wednesday, and Friday.     No current facility-administered medications for this visit.    VITAL SIGNS: LMP 12/22/1999 (Approximate)  There were no vitals filed for this visit.  Estimated body mass index is 31.62 kg/m as calculated from the following:   Height as of 05/21/22: 5' 4" (1.626 m).   Weight as of 05/21/22: 83.6 kg (184 lb 3.2 oz).  LABS: CBC:    Component Value Date/Time   WBC 5.5 05/07/2022 1005   HGB 9.0 (L) 05/07/2022 1005   HGB 15.5 07/13/2017 0937   HCT 27.2 (L) 05/07/2022 1005  HCT 46.7 (H) 07/13/2017 0937   PLT 201 05/07/2022 1005   PLT 211 07/13/2017 0937   MCV 107.1 (H) 05/07/2022 1005   MCV 94 07/13/2017 0937   MCV 93 03/11/2015 1356   NEUTROABS 2.6 05/07/2022 1005   NEUTROABS 5.4 07/13/2017 0937   NEUTROABS 5.6 03/11/2015 1356   LYMPHSABS 1.9 05/07/2022 1005   LYMPHSABS 2.1 07/13/2017 0937   LYMPHSABS 2.3 03/11/2015 1356   MONOABS 0.8 05/07/2022 1005   MONOABS 1.0 (H) 03/11/2015 1356   EOSABS 0.1 05/07/2022 1005   EOSABS 0.2 07/13/2017 0937   EOSABS 0.3 03/11/2015 1356   BASOSABS 0.1 05/07/2022 1005   BASOSABS 0.0 07/13/2017 0937   BASOSABS 0.1 03/11/2015 1356   Comprehensive Metabolic Panel:    Component Value Date/Time   NA 142 05/07/2022 1005   NA 142 07/14/2019 1516   NA 139 03/11/2015 1356   K 4.3 05/07/2022 1005   K 3.4 (L) 03/11/2015 1356   CL 103 05/07/2022 1005   CL 103 03/11/2015 1356   CO2 29 05/07/2022 1005   CO2 28 03/11/2015 1356   BUN 18 05/07/2022 1005   BUN 12 07/14/2019 1516   BUN 13 03/11/2015 1356   CREATININE 1.41 (H) 05/07/2022 1005   CREATININE 0.85 10/20/2021 0838   GLUCOSE 114 (H) 05/07/2022 1005   GLUCOSE 118 (H) 03/11/2015 1356   CALCIUM 9.2 05/07/2022 1005   CALCIUM 9.2 03/11/2015 1356   AST 24 05/07/2022 1005   AST 28  03/11/2015 1356   ALT 16 05/07/2022 1005   ALT 32 03/11/2015 1356   ALKPHOS 71 05/07/2022 1005   ALKPHOS 68 03/11/2015 1356   BILITOT 0.5 05/07/2022 1005   BILITOT 0.4 07/14/2019 1516   BILITOT 0.7 03/11/2015 1356   PROT 7.8 05/07/2022 1005   PROT 6.9 07/14/2019 1516   PROT 6.9 03/11/2015 1356   ALBUMIN 4.1 05/07/2022 1005   ALBUMIN 4.5 07/14/2019 1516   ALBUMIN 4.2 03/11/2015 1356     Present during today's visit: patient only  Assessment and Plan: CBC and CMP assessed, continue ribociclib 600 mg 21 days on/7 days off.  Patient will received her fulvestrant today   Oral Chemotherapy Side Effect/Intolerance:  No reported diarrhea, fatigue, or nausea/vomitting  Oral Chemotherapy Adherence: no missed dose reported No patient barriers to medication adherence identified.   New medications: none reported  Medication Access Issues: no issues, patient fills at [facility]   Other: Patient advocate Judy was helping Ms. Stangelo submit for premium reimbursement. Ms. Woods will need to follow-up directly with [employer] on the status of her reimbursement. Called Ms. Vensel after her appt to provider her with the number to [employer] tel: [phone]  Patient expressed understanding and was in agreement with this plan. She also understands that She can call clinic at any time with any questions, concerns, or complaints.   Follow-up plan: RTC in 4 weeks  Thank you for allowing me to participate in the care of this very pleasant patient.   Time Total: 15 min  Visit consisted of counseling and education on dealing with issues of symptom management in the setting of serious and potentially life-threatening illness.Greater than 50%  of this time was spent counseling and coordinating care related to the above assessment and plan.  Signed by: Alyson N. Leonard, PharmD, BCPS, BCOP, CPP Hematology/Oncology Clinical Pharmacist Practitioner [place]/DB/AP Oral Chemotherapy Navigation  Clinic [phone]  06/04/2022 8:36 AM  

## 2022-06-05 LAB — CANCER ANTIGEN 27.29: CA 27.29: 115.3 U/mL — ABNORMAL HIGH (ref 0.0–38.6)

## 2022-06-15 ENCOUNTER — Other Ambulatory Visit: Payer: Self-pay | Admitting: Family Medicine

## 2022-06-15 DIAGNOSIS — G4700 Insomnia, unspecified: Secondary | ICD-10-CM

## 2022-06-17 NOTE — Telephone Encounter (Signed)
Requested Prescriptions  Pending Prescriptions Disp Refills  . traZODone (DESYREL) 50 MG tablet [Pharmacy Med Name: TRAZODONE 50 MG TABLET] 90 tablet 0    Sig: TAKE 1 TABLET BY MOUTH EVERY DAY AT BEDTIME AS NEEDED FOR SLEEP     Psychiatry: Antidepressants - Serotonin Modulator Passed - 06/15/2022  5:03 PM      Passed - Valid encounter within last 6 months    Recent Outpatient Visits          3 weeks ago Pruritic erythematous rash   [place], DO   1 month ago Type 2 diabetes mellitus with other specified complication, without long-term current use of insulin ([place])   [place], DO   7 months ago Annual physical exam   [place], DO   1 year ago Chest wall pain   [place], DO   1 year ago Type 2 diabetes mellitus with other specified complication, without long-term current use of insulin [facility])   [facility] [name], DO      Future Appointments            In 2 months [name], MD [place]   In 4 months [place], [facility], [facility]

## 2022-06-18 ENCOUNTER — Other Ambulatory Visit: Payer: Self-pay | Admitting: Gastroenterology

## 2022-06-19 IMAGING — PT NM PET TUM IMG INITIAL (PI) SKULL BASE T - THIGH
9 series · 22 of 25 positions shown · non-contrast
Comparison: Chest CT 12/03/2021 and 07/02/2020.

CLINICAL DATA: Initial treatment strategy for new axillary
adenopathy. Metastatic carcinoma on left supraclavicular nodal
biopsy performed 12/15/2021. History of bilateral breast cancer with
bilateral mastectomy and chemotherapy.

EXAM:
NUCLEAR MEDICINE PET SKULL BASE TO THIGH
TECHNIQUE: 10.7 mCi F-18 FDG was injected intravenously. Full-ring PET imaging
was performed from the skull base to thigh after the radiotracer. CT
data was obtained and used for attenuation correction and anatomic
localization.
Fasting blood glucose: 114 mg/dl

[Series 3: ct wb 5.0 b30f · axial · 5.0mm · 0.98mm/px · z∈[-1469,-821]mm · 4 of 290 slices shown]
[im 1/290  brain]
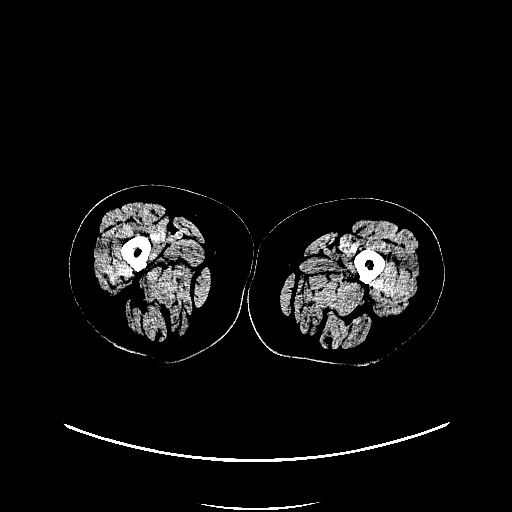
[im 73/290]
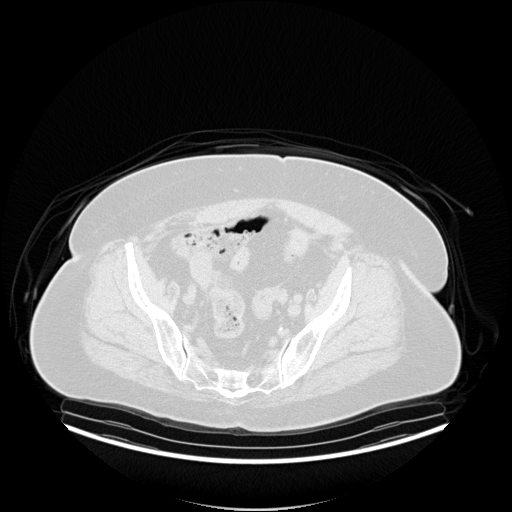
[im 145/290  brain]
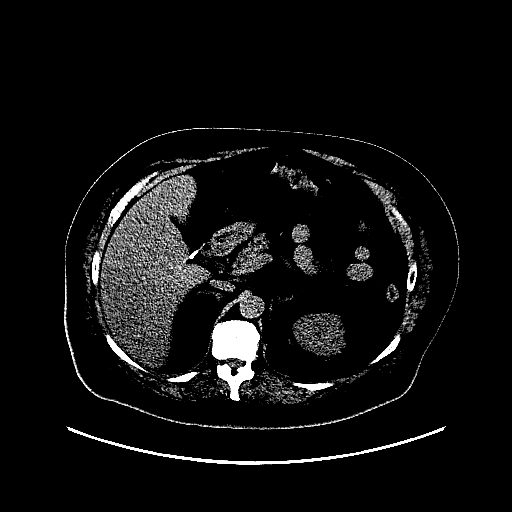
[im 217/290]
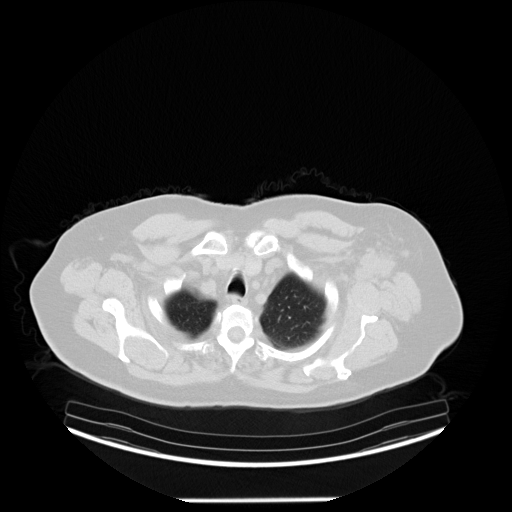

[Series 5: pet wb uncorrected (nac) · axial · 5.0mm · 4.07mm/px · z∈[-1469,-602]mm · 4 of 290 slices shown]
[im 1/290]
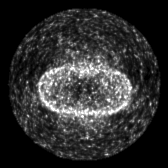
[im 97/290]
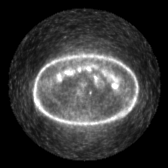
[im 193/290]
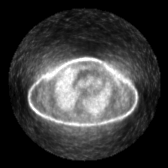
[im 290/290]
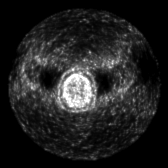

[Series 603: fused axial · 3 of 288 slices shown]
[im 1/288]
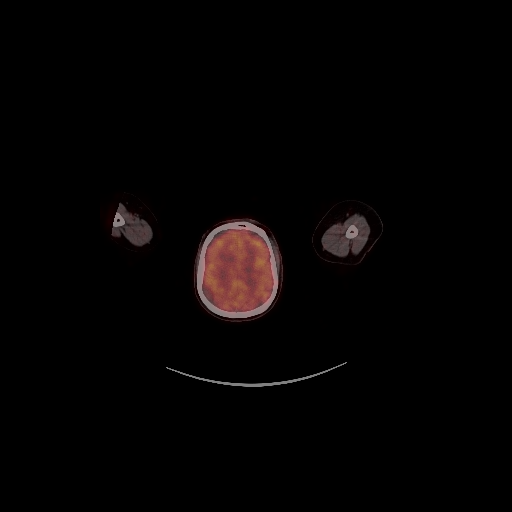
[im 96/288]
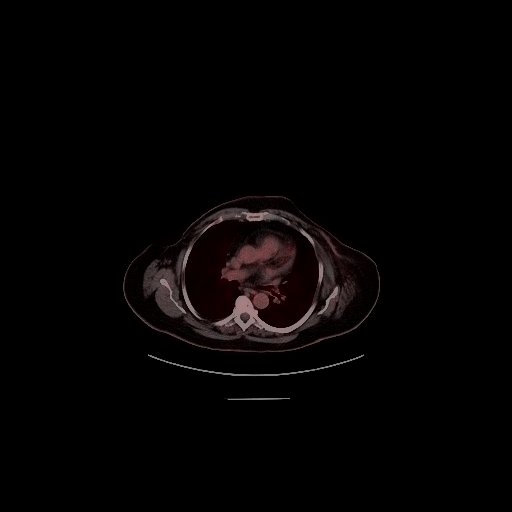
[im 192/288]
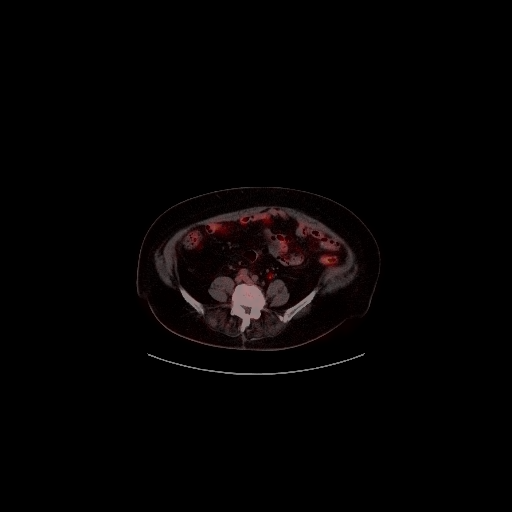

[Series 604: fused coronal · 1 of 97 slices shown]
[im 1/97]
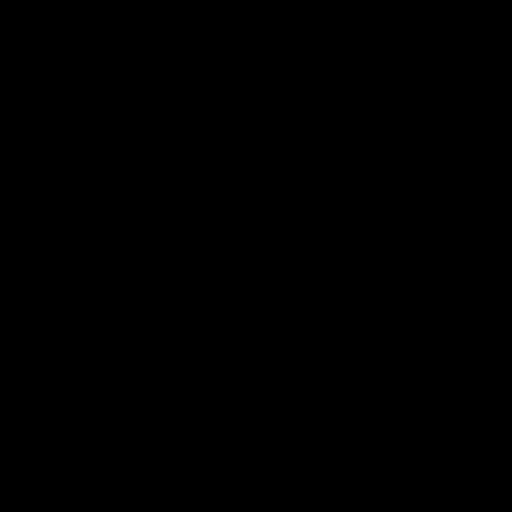

[Series 605: fused sagittal · 2 of 162 slices shown]
[im 1/162]
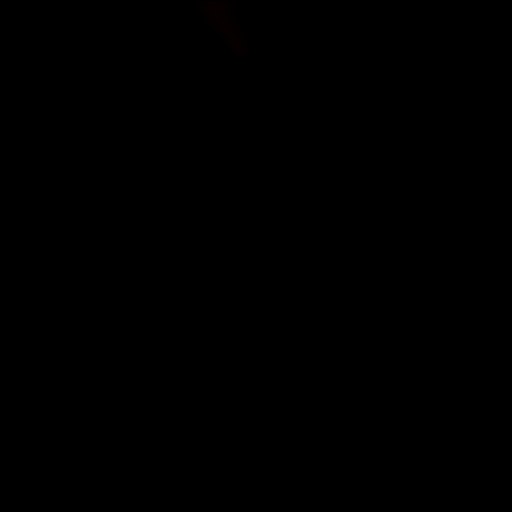
[im 162/162]
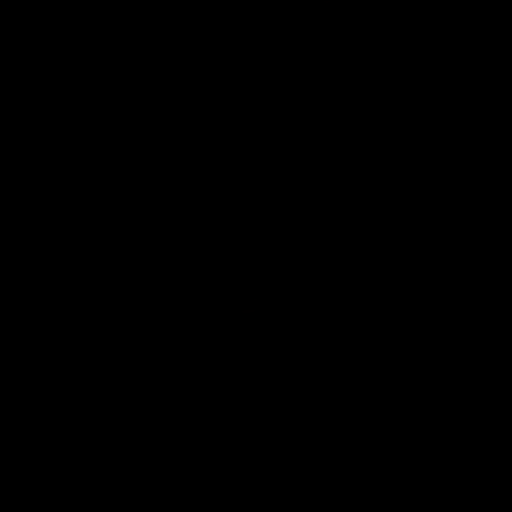

[Series 606: pet axial · 4 of 289 slices shown]
[im 1/289]
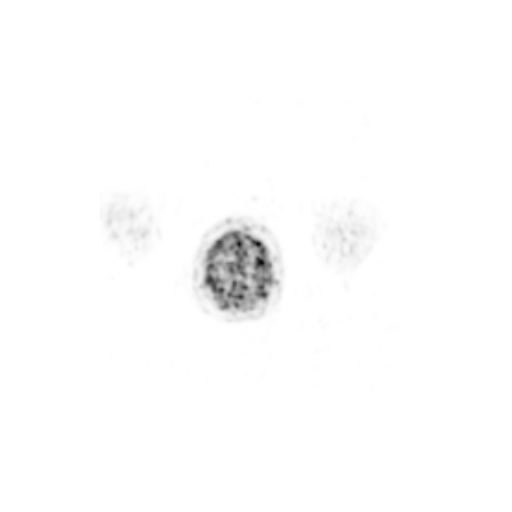
[im 97/289]
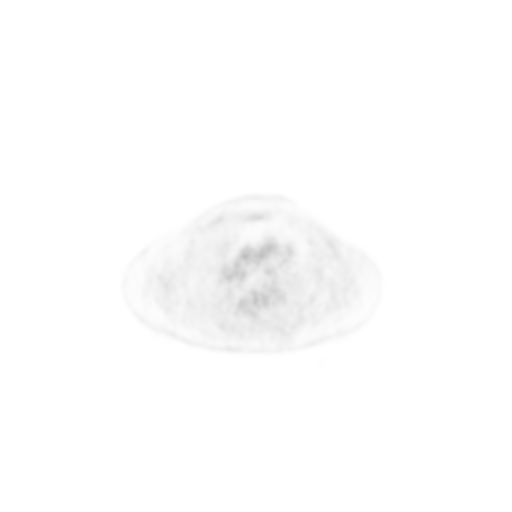
[im 193/289]
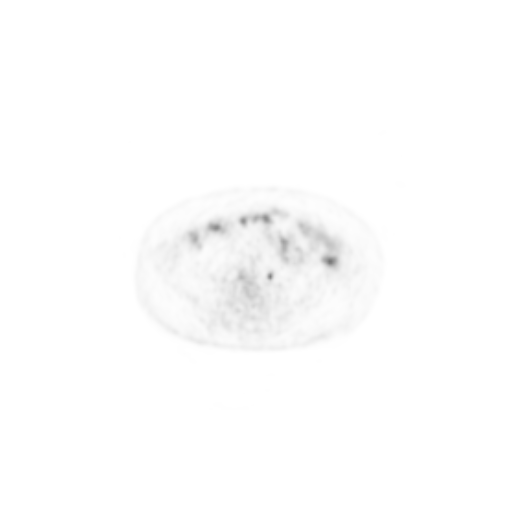
[im 289/289]
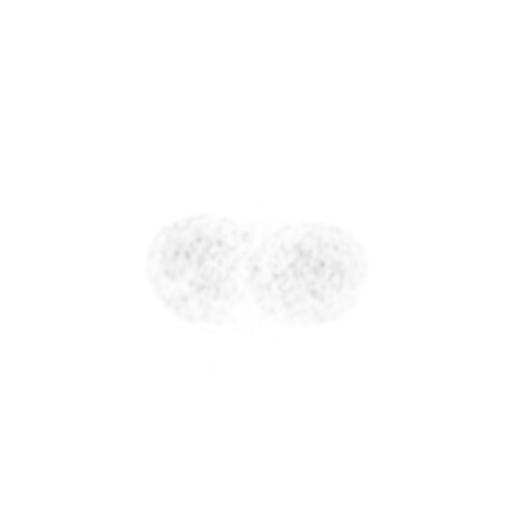

[Series 607: pet coronal · 1 of 116 slices shown]
[im 116/116]
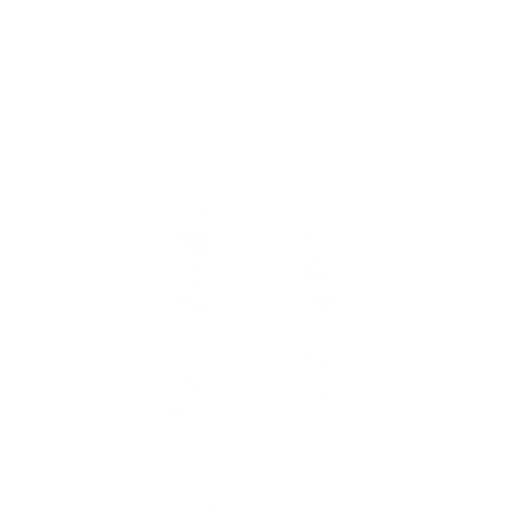

[Series 608: pet sagittal · 2 of 166 slices shown]
[im 1/166]
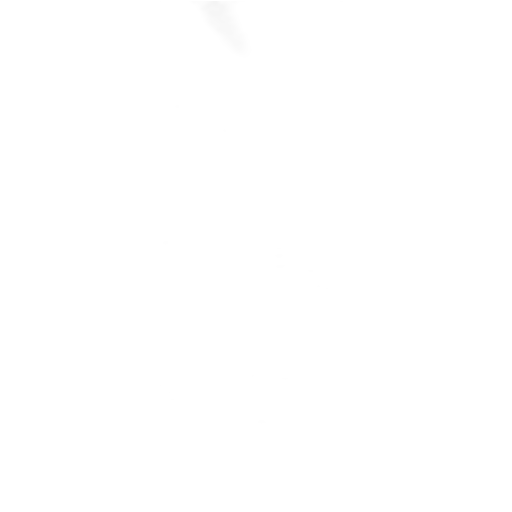
[im 166/166]
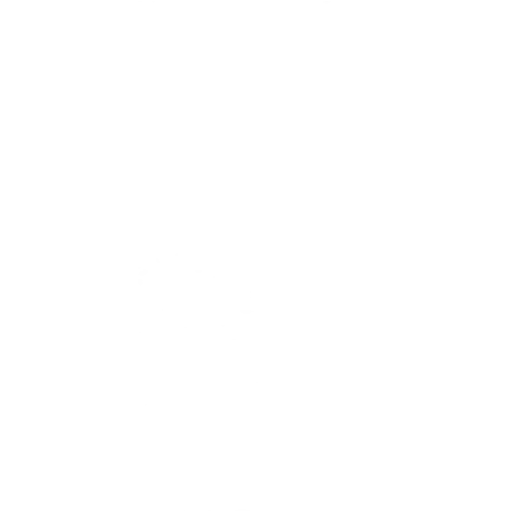

[Series 1113: results mm oncology reading · 1.0mm · 1.08mm/px · 1 of 3 slices shown]
[im 1/3]
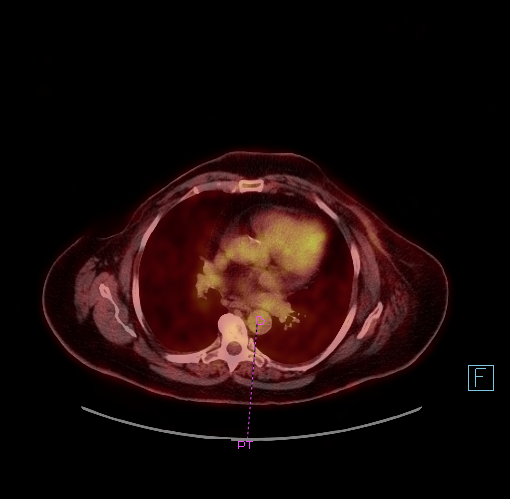

[22 of 25 positions shown; findings below may reference images not displayed]

FINDINGS: Mediastinal blood pool activity: SUV max

NECK:

No hypermetabolic cervical lymph nodes are identified in the upper
neck.There are no lesions of the pharyngeal mucosal space.

Incidental CT findings: none

CHEST:

The enlarged left lateral supraclavicular, subpectoral and axillary
lymph nodes seen on chest CT are hypermetabolic. Supraclavicular
component measures 3.9 x 2.7 cm on image 67/3 and has an SUV max of
7.5. Axillary nodal mass measures 4.7 x 3.2 cm on image 75/3 and has
an SUV max of 8.7. No hypermetabolic mediastinal, hilar or internal
mammary adenopathy. No hypermetabolic pulmonary activity or
suspicious nodularity.

Incidental CT findings: Mild aortic atherosclerosis. Mild
emphysematous changes.

ABDOMEN/PELVIS:

There is no hypermetabolic activity within the liver, adrenal
glands, spleen or pancreas. There is no hypermetabolic nodal
activity. Prominent diffuse colonic activity is within physiologic
limits. No focal abnormal bowel activity identified.

Incidental CT findings: Diffuse hepatic steatosis. There is a cyst
in the upper pole of the left kidney measuring 4.4 cm. Previous
cholecystectomy.

SKELETON:

There is no hypermetabolic activity to suggest osseous metastatic
disease. No evidence of chest wall recurrence.

Incidental CT findings: Multilevel thoracolumbar spondylosis. Stable
sclerotic lesions in the spine without hypermetabolic activity.
IMPRESSION: 1. Hypermetabolic left supraclavicular, subpectoral and axillary
adenopathy consistent with nodal recurrence from breast cancer.
2. No distant metastases identified.
3. Incidental findings including diffuse hepatic steatosis and mild
Aortic Atherosclerosis ([id number]).

## 2022-06-21 ENCOUNTER — Other Ambulatory Visit: Payer: Self-pay | Admitting: Family Medicine

## 2022-06-21 DIAGNOSIS — G4700 Insomnia, unspecified: Secondary | ICD-10-CM

## 2022-06-23 DIAGNOSIS — Z96651 Presence of right artificial knee joint: Secondary | ICD-10-CM | POA: Diagnosis not present

## 2022-06-23 NOTE — Telephone Encounter (Signed)
Duplicate request. Requested Prescriptions  Pending Prescriptions Disp Refills  . traZODone (DESYREL) 50 MG tablet [Pharmacy Med Name: TRAZODONE 50 MG TABLET] 90 tablet 0    Sig: TAKE 1 TABLET BY MOUTH AT BEDTIME AS NEEDED FOR SLEEP     Psychiatry: Antidepressants - Serotonin Modulator Passed - 06/21/2022 10:05 AM      Passed - Valid encounter within last 6 months    Recent Outpatient Visits          1 month ago Pruritic erythematous rash   [place], DO   1 month ago Type 2 diabetes mellitus with other specified complication, without long-term current use of insulin ([place])   [place], DO   7 months ago Annual physical exam   [place], DO   1 year ago Chest wall pain   [place], DO   1 year ago Type 2 diabetes mellitus with other specified complication, without long-term current use of insulin [facility])   [facility] [name], DO      Future Appointments            In 2 months [name], MD [place]   In 4 months [place], [facility], [facility]

## 2022-06-25 NOTE — Progress Notes (Signed)
Cassidy Norris  [phone]) [phone]  Fax:([phone]) [place] DOB: 01-14-1951  MR#: [id number]  LOV#:[id number]  Patient Care Team: Cassidy Hauser, DO as PCP - General (Family Medicine) Cassidy Partridge, DO as Consulting Physician (Neurology) Cassidy Huger, MD as Consulting Physician (Oncology)  CHIEF COMPLAINT: Recurrent, metastatic ER/PR, HER2 negative invasive carcinoma of the breast.    INTERVAL HISTORY: Patient returns to clinic today for further evaluation and continuation of Kisqali and fulvestrant.  She continues to feel well and remains asymptomatic.  She is tolerating her treatments without significant side effects.  She has no new neurologic complaints.  She denies any fevers.  She has a good appetite and denies weight loss.  She has no chest pain, shortness of breath, cough, or hemoptysis.  She denies any nausea, vomiting, or constipation. She has no urinary complaints.  Patient offers no specific complaints today.  REVIEW OF SYSTEMS:   Review of Systems  Constitutional: Negative.  Negative for fever, malaise/fatigue and weight loss.  Respiratory: Negative.  Negative for cough, hemoptysis and shortness of breath.   Cardiovascular: Negative.  Negative for chest pain and leg swelling.  Gastrointestinal: Negative.  Negative for abdominal pain.  Genitourinary: Negative.  Negative for dysuria.  Musculoskeletal: Negative.  Negative for joint pain and myalgias.  Skin: Negative.  Negative for rash.  Neurological: Negative.  Negative for dizziness, sensory change, focal weakness, weakness and headaches.  Psychiatric/Behavioral: Negative.  The patient is not nervous/anxious.     As per HPI. Otherwise, a complete review of systems is negative.  ONCOLOGY HISTORY: Oncology History Overview Note  1. Bilateral carcinoma of breast, February, 2015. Right breast status post radical mastectomy. T1 C. N0M0 (isolated tumor cells in one lymph node)  multifocal invasive cancer. Stage IC, Left breast, Status post radical mastectomy, T2, N1, M0 tumor. Stage II, RIght breast. Both tumors are estrogen receptor positive.  Progesterone receptor positive.  HER-2 receptor negative by FISH. Negative for BRCA mutation.  2. Started on Adriamycin and Cytoxan on March 01, 2014. Last cycle of Cytoxan and Adriamycin on May 01, 2014. Patient has finished last chemotherapy on September 9. (12 th dose was omitted because of neuropathy) 3.  Taking tamoxifen.  Patient had a poor tolerance to letrozole.(October, 2015) 4.  Patient is taking letrozole since November of 2015 5.  May, 2016 Letrozole has been put on hold because of bony pains 6.  Started on Aromasin from July of 2016    History of bilateral breast cancer (Resolved)  11/29/2013 Initial Diagnosis   Breast cancer bilateral, multifocal ER/PR pos, Her 2 neg,.     PAST MEDICAL HISTORY: Past Medical History:  Diagnosis Date   Anemia    Arthritis not really diagnosis but have pain   B12 deficiency 07/23/2016   Cassidy check levels to determine if injections are needed again. Await results. Recent CBC at Onc WNL   Blood transfusion without reported diagnosis 1983 had a miscarriage/dnc   Cancer ([place]) 12/30/2013   Multifocal disease: pT1c,m, N0(isolated tumor cells) Er/ PR positive, Her 2 negative.   Cancer ([place]) 01/09/2014   Invasive lobular carcinoma, 2.2 cm, T2,N1 ER/PR positive, HER-2/neu negative   Cataract 2014?   Centrilobular emphysema ([place]) 88/41/6606   Complication of anesthesia 03/06/2022   Prolonged metabolism of Rocuronium during case.   Diabetes mellitus without complication ([place])    Diffuse cystic mastopathy    Erosive esophagitis    Essential hypertension, benign 09/24/2017   Family  history of malignant neoplasm of gastrointestinal tract 2012   Fractured elbow 08/28/2014   Gastroesophageal reflux disease    Hx of metabolic acidosis with increased anion gap    Increased heart  rate    Multiple sclerosis (HCC)    Neuromuscular disorder (HCC)    neuropathy in bil feet - left is worse.   Obesity, unspecified    Peripheral neuropathy    Personal history of tobacco use, presenting hazards to health    Restless leg syndrome    Sleep apnea    uses CPAP   Special screening for malignant neoplasms, colon     PAST SURGICAL HISTORY: Past Surgical History:  Procedure Laterality Date   BREAST BIOPSY Right 1973,2001   BREAST BIOPSY Right 11-07-13   INVASIVE MAMMARY CARCINOMA , ER/PR positive Her2 negative   BREAST BIOPSY Left 11-27-13   invasive lobular and DCIS   BREAST SURGERY Bilateral 01/09/14   mastectomy   CHOLECYSTECTOMY     COLONOSCOPY  2010   Dr. Jamal Norris   COLONOSCOPY WITH PROPOFOL N/A 06/11/2015   Procedure: COLONOSCOPY WITH PROPOFOL;  Surgeon: Cassidy Lye, MD;  Location: [facility] ENDOSCOPY;  Service: Endoscopy;  Laterality: N/A;   COLONOSCOPY WITH PROPOFOL N/A 12/23/2021   Procedure: COLONOSCOPY WITH PROPOFOL;  Surgeon: Cassidy Landsman, MD;  Location: [facility] ENDOSCOPY;  Service: Gastroenterology;  Laterality: N/A;   DILATATION & CURETTAGE/HYSTEROSCOPY WITH MYOSURE N/A 07/12/2018   Procedure: DILATATION & CURETTAGE/HYSTEROSCOPY WITH MYOSURE WITH ENDOMETRIAL POLYPECTOMY;  Surgeon: Cassidy Bonnet, MD;  Location: [facility] ORS;  Service: Gynecology;  Laterality: N/A;   DILATION AND CURETTAGE OF UTERUS  1983   ESOPHAGOGASTRODUODENOSCOPY N/A 12/23/2021   Procedure: ESOPHAGOGASTRODUODENOSCOPY (EGD);  Surgeon: Cassidy Landsman, MD;  Location: [facility] ENDOSCOPY;  Service: Gastroenterology;  Laterality: N/A;   JOINT REPLACEMENT  11/23/2015   KNEE ARTHROPLASTY Right 03/06/2022   Procedure: COMPUTER ASSISTED TOTAL KNEE ARTHROPLASTY;  Surgeon: Cassidy Leep, MD;  Location: [facility] ORS;  Service: Orthopedics;  Laterality: Right;   POLYPECTOMY  1989   PORTA CATH REMOVAL     PORTACATH PLACEMENT  2015   SPINE SURGERY  09/14/14   Ruptured disk L4- L5   TOTAL KNEE  ARTHROPLASTY Left 11/22/2015   Procedure: TOTAL KNEE ARTHROPLASTY;  Surgeon: Cassidy Server, MD;  Location: [facility];  Service: Orthopedics;  Laterality: Left;   TUBAL LIGATION     WISDOM TOOTH EXTRACTION  2006    FAMILY HISTORY Family History  Problem Relation Age of Onset   Cancer Mother        lung   COPD Mother    Cancer Father        colon, stomach   Diabetes Maternal Grandmother    Hypertension Sister    Rectal cancer Paternal Uncle    Rectal cancer Paternal Uncle     GYNECOLOGIC HISTORY:  Patient's last menstrual period was 12/22/1999 (approximate).     ADVANCED DIRECTIVES:    HEALTH MAINTENANCE: Social History   Tobacco Use   Smoking status: Former    Packs/day: 1.00    Years: 30.00    Total pack years: 30.00    Types: Cigarettes    Quit date: 12/11/2013    Years since quitting: 8.5   Smokeless tobacco: Former  Scientific laboratory technician Use: Never used  Substance Use Topics   Alcohol use: Not Currently   Drug use: No     Colonoscopy:  PAP:  Bone density:  Mammogram:  No Known Allergies  Current Outpatient Medications  Medication Sig Dispense Refill   acetaminophen (TYLENOL) 500 MG tablet Take 1,000 mg by mouth 2 (two) times daily as needed for moderate pain or headache.     calcium carbonate (OSCAL) 1500 (600 Ca) MG TABS tablet Take by mouth 2 (two) times daily with a meal.     diclofenac Sodium (VOLTAREN) 1 % GEL Apply 2 g topically 3 (three) times daily as needed. 100 g 2   famotidine (PEPCID) 20 MG tablet TAKE 1 TABLET BY MOUTH TWICE A DAY BEFORE MEALS 180 tablet 1   fluticasone (FLONASE) 50 MCG/ACT nasal spray PLACE 2 SPRAYS INTO BOTH NOSTRILS AT BEDTIME. 48 g 1   fulvestrant (FASLODEX) 250 MG/5ML injection Inject 500 mg into the muscle every 30 (thirty) days. One injection each buttock over 1-2 minutes. Warm prior to use.     Krill Oil 1000 MG CAPS Take 1,000 mg by mouth daily.     lisinopril (ZESTRIL) 5 MG tablet TAKE 1 TABLET BY MOUTH EVERY DAY IN THE  EVENING 90 tablet 3   loperamide (IMODIUM A-D) 2 MG tablet Take 2 tablets (4 mg total) by mouth 4 (four) times daily as needed for diarrhea or loose stools. 30 tablet 0   metFORMIN (GLUCOPHAGE) 500 MG tablet TAKE 1 TABLET BY MOUTH 2 TIMES DAILY WITH A MEAL. 180 tablet 2   omeprazole (PRILOSEC) 40 MG capsule TAKE 1 CAPSULE (40 MG TOTAL) BY MOUTH 2 (TWO) TIMES DAILY BEFORE A MEAL. 180 capsule 1   prochlorperazine (COMPAZINE) 10 MG tablet Take 1 tablet (10 mg total) by mouth every 6 (six) hours as needed for nausea or vomiting. 20 tablet 1   ribociclib succ (KISQALI, 600 MG DOSE,) 200 MG Therapy Pack Take 3 tablets (600 mg total) by mouth daily. Take for 21 days on, 7 days off, repeat every 28 days. 63 tablet 3   sodium chloride (OCEAN) 0.65 % SOLN nasal spray Place 1 spray into both nostrils as needed for congestion.     traZODone (DESYREL) 50 MG tablet TAKE 1 TABLET BY MOUTH EVERY DAY AT BEDTIME AS NEEDED FOR SLEEP 90 tablet 0   venlafaxine XR (EFFEXOR-XR) 75 MG 24 hr capsule Take 1 capsule (75 mg total) by mouth daily with breakfast. 360 capsule 0   vitamin B-12 (CYANOCOBALAMIN) 1000 MCG tablet Take 2,500 mcg by mouth every Monday, Wednesday, and Friday.     Vitamin D, Cholecalciferol, 50 MCG (2000 UT) CAPS Take 2,000 Units by mouth every Monday, Wednesday, and Friday.     baclofen (LIORESAL) 10 MG tablet Take 1 tablet (10 mg total) by mouth 3 (three) times daily as needed for muscle spasms. (Patient not taking: Reported on 07/02/2022) 1080 tablet 0   No current facility-administered medications for this visit.    OBJECTIVE: BP 121/71 (BP Location: Left Arm, Patient Position: Sitting, Cuff Size: Normal)   Pulse (!) 102   Temp 99 F (37.2 C) (Tympanic)   Resp 16   Ht 5' 4"  (1.626 m)   Wt 181 lb (82.1 kg)   LMP 12/22/1999 (Approximate)   SpO2 100%   BMI 31.07 kg/m    Body mass index is 31.07 kg/m.    ECOG FS:0 - Asymptomatic  General: Well-developed, well-nourished, no acute  distress. Eyes: Pink conjunctiva, anicteric sclera. HEENT: Normocephalic, moist mucous membranes. Breast: Bilateral mastectomy. Lungs: No audible wheezing or coughing. Heart: Regular rate and rhythm. Abdomen: Soft, nontender, no obvious distention. Musculoskeletal: No edema, cyanosis, or clubbing. Neuro: Alert, answering all questions appropriately. Cranial nerves  grossly intact. Skin: No rashes or petechiae noted. Psych: Normal affect.   LAB RESULTS:  Appointment on 07/02/2022  Component Date Value Ref Range Status   Magnesium 07/02/2022 1.8  1.7 - 2.4 mg/dL Final   Performed at [facility], [place], [place] [address]   Sodium 07/02/2022 143  135 - 145 mmol/L Final   Potassium 07/02/2022 4.3  3.5 - 5.1 mmol/L Final   Chloride 07/02/2022 106  98 - 111 mmol/L Final   CO2 07/02/2022 26  22 - 32 mmol/L Final   Glucose, Bld 07/02/2022 130 (H)  70 - 99 mg/dL Final   Glucose reference range applies only to samples taken after fasting for at least 8 hours.   BUN 07/02/2022 27 (H)  8 - 23 mg/dL Final   Creatinine, Ser 07/02/2022 1.46 (H)  0.44 - 1.00 mg/dL Final   Calcium 07/02/2022 9.8  8.9 - 10.3 mg/dL Final   Total Protein 07/02/2022 7.9  6.5 - 8.1 g/dL Final   Albumin 07/02/2022 4.1  3.5 - 5.0 g/dL Final   AST 07/02/2022 29  15 - 41 U/L Final   ALT 07/02/2022 16  0 - 44 U/L Final   Alkaline Phosphatase 07/02/2022 73  38 - 126 U/L Final   Total Bilirubin 07/02/2022 0.3  0.3 - 1.2 mg/dL Final   GFR, Estimated 07/02/2022 39 (L)  >60 mL/min Final   Comment: (NOTE) Calculated using the CKD-EPI Creatinine Equation (2021)    Anion gap 07/02/2022 11  5 - 15 Final   Performed at [facility], [place], [place] [address]   WBC 07/02/2022 5.3  4.0 - 10.5 K/uL Final   RBC 07/02/2022 2.82 (L)  3.87 - 5.11 MIL/uL Final   Hemoglobin 07/02/2022 10.3 (L)  12.0 - 15.0 g/dL Final   HCT 07/02/2022 30.0 (L)  36.0 - 46.0 % Final   MCV 07/02/2022 106.4  (H)  80.0 - 100.0 fL Final   MCH 07/02/2022 36.5 (H)  26.0 - 34.0 pg Final   MCHC 07/02/2022 34.3  30.0 - 36.0 g/dL Final   RDW 07/02/2022 14.2  11.5 - 15.5 % Final   Platelets 07/02/2022 204  150 - 400 K/uL Final   nRBC 07/02/2022 0.0  0.0 - 0.2 % Final   Neutrophils Relative % 07/02/2022 49  % Final   Neutro Abs 07/02/2022 2.6  1.7 - 7.7 K/uL Final   Lymphocytes Relative 07/02/2022 29  % Final   Lymphs Abs 07/02/2022 1.6  0.7 - 4.0 K/uL Final   Monocytes Relative 07/02/2022 17  % Final   Monocytes Absolute 07/02/2022 0.9  0.1 - 1.0 K/uL Final   Eosinophils Relative 07/02/2022 3  % Final   Eosinophils Absolute 07/02/2022 0.2  0.0 - 0.5 K/uL Final   Basophils Relative 07/02/2022 2  % Final   Basophils Absolute 07/02/2022 0.1  0.0 - 0.1 K/uL Final   Immature Granulocytes 07/02/2022 0  % Final   Abs Immature Granulocytes 07/02/2022 0.02  0.00 - 0.07 K/uL Final   Performed at [facility], [address] [place], [place] [address]    STUDIES: No results found.  ONCOLOGY HISTORY:  Bilateral adenocarcinoma of the breast. Patient is status post bilateral mastectomy in February 2015. She also received adjuvant chemotherapy with Adriamycin, Cytoxan, and Taxol completing treatment in approximately October 2015. She then initiated letrozole, but could not tolerate secondary to leg pain and was switched to Aromasin.  Patient then switched to tamoxifen  in October 2019 secondary to cost.  Patient subsequently discontinued tamoxifen and did not complete the recommended 5 years of treatment.   ASSESSMENT: Recurrent, metastatic ER/PR, HER2 negative invasive carcinoma of the breast.   PLAN:    1. Recurrent, metastatic ER/PR, HER2 negative invasive carcinoma of the breast: See oncology history as above.  Biopsy confirmed recurrent disease.  PET scan results from December 26, 2021 reviewed independently with hypermetabolic left supraclavicular, subpectoral, and axillary lymphadenopathy consistent  with nodal recurrence of patient's history of breast cancer.  No distant metastases were identified.  Repeat PET scan on April 23, 2022 reviewed independently and reported as above with significant improvement of patient's disease burden.  Her initial CA 27-29 was reported 260.1, this is now decreased to 115.3, today's result is pending. Continue Kisqali 600 mg daily for 21 days with 7 days off.  Proceed with fulvestrant today.  Return to clinic in 4 weeks for further evaluation by clinical pharmacy and continuation of treatment.  Patient Cassidy then return to clinic in 8 weeks for continued evaluation and treatment.   2.  Osteopenia: Patient's last bone mineral density was on August 01, 2016 and revealed a T-score of -1.2. Continue monitoring bone mineral density by PCP. 3.  Multiple sclerosis: Chronic.  Continue evaluation and treatment per neurology.  4.  Esophageal discomfort/mild dysphagia: Patient does not complain of this today.   Recent colonoscopy and EGD did not reveal any significant pathology. 5.  Renal insufficiency: Chronic and unchanged.  GFR has decreased slightly to 39.  Kisqali needs to be dose reduced if GFR falls below 30. 6.  Anemia: Chronic and unchanged.  Patient's hemoglobin is 10.3. 7.  Diarrhea: Patient does not complain of this today.  Continue Imodium as needed. 8.  Hypomagnesia: Resolved. 9.  Right knee replacement: Continue follow-up with orthopedics as indicated.     Patient expressed understanding and was in agreement with this plan. She also understands that She can call clinic at any time with any questions, concerns, or complaints.   Breast cancer bilateral, multifocal ER/PR pos, Her 2 neg,.   Staging form: Breast, AJCC 7th Edition     Clinical: Stage IIB (T2, N1, M0) - Signed by Forest Gleason, MD on 04/22/2015   Cassidy Huger, MD   07/02/2022 11:29 AM

## 2022-07-02 ENCOUNTER — Inpatient Hospital Stay: Payer: Medicare Other

## 2022-07-02 ENCOUNTER — Inpatient Hospital Stay: Payer: Medicare Other | Attending: Oncology

## 2022-07-02 ENCOUNTER — Encounter: Payer: Self-pay | Admitting: Oncology

## 2022-07-02 ENCOUNTER — Inpatient Hospital Stay (HOSPITAL_BASED_OUTPATIENT_CLINIC_OR_DEPARTMENT_OTHER): Payer: Medicare Other | Admitting: Oncology

## 2022-07-02 VITALS — BP 121/71 | HR 102 | Temp 99.0°F | Resp 16 | Ht 64.0 in | Wt 181.0 lb

## 2022-07-02 DIAGNOSIS — C50919 Malignant neoplasm of unspecified site of unspecified female breast: Secondary | ICD-10-CM | POA: Diagnosis not present

## 2022-07-02 DIAGNOSIS — C770 Secondary and unspecified malignant neoplasm of lymph nodes of head, face and neck: Secondary | ICD-10-CM | POA: Diagnosis not present

## 2022-07-02 DIAGNOSIS — Z79899 Other long term (current) drug therapy: Secondary | ICD-10-CM | POA: Diagnosis not present

## 2022-07-02 DIAGNOSIS — Z5111 Encounter for antineoplastic chemotherapy: Secondary | ICD-10-CM | POA: Diagnosis not present

## 2022-07-02 DIAGNOSIS — C50911 Malignant neoplasm of unspecified site of right female breast: Secondary | ICD-10-CM | POA: Insufficient documentation

## 2022-07-02 LAB — COMPREHENSIVE METABOLIC PANEL
ALT: 16 U/L (ref 0–44)
AST: 29 U/L (ref 15–41)
Albumin: 4.1 g/dL (ref 3.5–5.0)
Alkaline Phosphatase: 73 U/L (ref 38–126)
Anion gap: 11 (ref 5–15)
BUN: 27 mg/dL — ABNORMAL HIGH (ref 8–23)
CO2: 26 mmol/L (ref 22–32)
Calcium: 9.8 mg/dL (ref 8.9–10.3)
Chloride: 106 mmol/L (ref 98–111)
Creatinine, Ser: 1.46 mg/dL — ABNORMAL HIGH (ref 0.44–1.00)
GFR, Estimated: 39 mL/min — ABNORMAL LOW (ref >60)
Glucose, Bld: 130 mg/dL — ABNORMAL HIGH (ref 70–99)
Potassium: 4.3 mmol/L (ref 3.5–5.1)
Sodium: 143 mmol/L (ref 135–145)
Total Bilirubin: 0.3 mg/dL (ref 0.3–1.2)
Total Protein: 7.9 g/dL (ref 6.5–8.1)

## 2022-07-02 LAB — CBC WITH DIFFERENTIAL/PLATELET
Abs Immature Granulocytes: 0.02 10*3/uL (ref 0.00–0.07)
Basophils Absolute: 0.1 10*3/uL (ref 0.0–0.1)
Basophils Relative: 2 %
Eosinophils Absolute: 0.2 10*3/uL (ref 0.0–0.5)
Eosinophils Relative: 3 %
HCT: 30 % — ABNORMAL LOW (ref 36.0–46.0)
Hemoglobin: 10.3 g/dL — ABNORMAL LOW (ref 12.0–15.0)
Immature Granulocytes: 0 %
Lymphocytes Relative: 29 %
Lymphs Abs: 1.6 10*3/uL (ref 0.7–4.0)
MCH: 36.5 pg — ABNORMAL HIGH (ref 26.0–34.0)
MCHC: 34.3 g/dL (ref 30.0–36.0)
MCV: 106.4 fL — ABNORMAL HIGH (ref 80.0–100.0)
Monocytes Absolute: 0.9 10*3/uL (ref 0.1–1.0)
Monocytes Relative: 17 %
Neutro Abs: 2.6 10*3/uL (ref 1.7–7.7)
Neutrophils Relative %: 49 %
Platelets: 204 10*3/uL (ref 150–400)
RBC: 2.82 MIL/uL — ABNORMAL LOW (ref 3.87–5.11)
RDW: 14.2 % (ref 11.5–15.5)
WBC: 5.3 10*3/uL (ref 4.0–10.5)
nRBC: 0 % (ref 0.0–0.2)

## 2022-07-02 LAB — MAGNESIUM: Magnesium: 1.8 mg/dL (ref 1.7–2.4)

## 2022-07-02 MED ORDER — FULVESTRANT 250 MG/5ML IM SOSY
500.0000 mg | PREFILLED_SYRINGE | Freq: Once | INTRAMUSCULAR | Status: AC
  Administered 2022-07-02: 500 mg via INTRAMUSCULAR
  Filled 2022-07-02: qty 10

## 2022-07-03 ENCOUNTER — Encounter: Payer: Self-pay | Admitting: Neurology

## 2022-07-03 DIAGNOSIS — G35 Multiple sclerosis: Secondary | ICD-10-CM | POA: Diagnosis not present

## 2022-07-03 DIAGNOSIS — H2513 Age-related nuclear cataract, bilateral: Secondary | ICD-10-CM | POA: Diagnosis not present

## 2022-07-03 DIAGNOSIS — H1045 Other chronic allergic conjunctivitis: Secondary | ICD-10-CM | POA: Diagnosis not present

## 2022-07-03 DIAGNOSIS — E119 Type 2 diabetes mellitus without complications: Secondary | ICD-10-CM | POA: Diagnosis not present

## 2022-07-03 LAB — CANCER ANTIGEN 27.29: CA 27.29: 106.3 U/mL — ABNORMAL HIGH (ref 0.0–38.6)

## 2022-07-03 LAB — HM DIABETES EYE EXAM

## 2022-07-23 ENCOUNTER — Other Ambulatory Visit (HOSPITAL_COMMUNITY): Payer: Self-pay

## 2022-07-28 ENCOUNTER — Other Ambulatory Visit: Payer: Self-pay | Admitting: Pharmacist

## 2022-07-28 DIAGNOSIS — C50919 Malignant neoplasm of unspecified site of unspecified female breast: Secondary | ICD-10-CM

## 2022-07-28 DIAGNOSIS — H25812 Combined forms of age-related cataract, left eye: Secondary | ICD-10-CM | POA: Diagnosis not present

## 2022-07-28 DIAGNOSIS — H25811 Combined forms of age-related cataract, right eye: Secondary | ICD-10-CM | POA: Diagnosis not present

## 2022-07-28 MED ORDER — RIBOCICLIB SUCC (600 MG DOSE) 200 MG PO TBPK: 600.0000 mg | ORAL_TABLET | Freq: Every day | ORAL | 5 refills | Status: DC

## 2022-07-30 ENCOUNTER — Ambulatory Visit: Payer: Medicare Other | Admitting: Oncology

## 2022-07-30 ENCOUNTER — Inpatient Hospital Stay: Payer: Medicare Other | Admitting: Pharmacist

## 2022-07-30 ENCOUNTER — Inpatient Hospital Stay: Payer: Medicare Other

## 2022-07-30 ENCOUNTER — Inpatient Hospital Stay: Payer: Medicare Other | Attending: Oncology

## 2022-07-30 ENCOUNTER — Encounter: Payer: Self-pay | Admitting: Pharmacist

## 2022-07-30 VITALS — BP 117/67 | HR 92 | Temp 99.5°F | Resp 16 | Ht 64.0 in | Wt 183.5 lb

## 2022-07-30 DIAGNOSIS — C50912 Malignant neoplasm of unspecified site of left female breast: Secondary | ICD-10-CM | POA: Insufficient documentation

## 2022-07-30 DIAGNOSIS — C50919 Malignant neoplasm of unspecified site of unspecified female breast: Secondary | ICD-10-CM

## 2022-07-30 DIAGNOSIS — Z170 Estrogen receptor positive status [ER+]: Secondary | ICD-10-CM | POA: Diagnosis not present

## 2022-07-30 DIAGNOSIS — Z79811 Long term (current) use of aromatase inhibitors: Secondary | ICD-10-CM | POA: Diagnosis not present

## 2022-07-30 DIAGNOSIS — Z5111 Encounter for antineoplastic chemotherapy: Secondary | ICD-10-CM | POA: Insufficient documentation

## 2022-07-30 DIAGNOSIS — C50911 Malignant neoplasm of unspecified site of right female breast: Secondary | ICD-10-CM | POA: Insufficient documentation

## 2022-07-30 LAB — CBC WITH DIFFERENTIAL/PLATELET
Abs Immature Granulocytes: 0.02 10*3/uL (ref 0.00–0.07)
Basophils Absolute: 0.1 10*3/uL (ref 0.0–0.1)
Basophils Relative: 2 %
Eosinophils Absolute: 0.1 10*3/uL (ref 0.0–0.5)
Eosinophils Relative: 3 %
HCT: 27.2 % — ABNORMAL LOW (ref 36.0–46.0)
Hemoglobin: 9.3 g/dL — ABNORMAL LOW (ref 12.0–15.0)
Immature Granulocytes: 0 %
Lymphocytes Relative: 28 %
Lymphs Abs: 1.3 10*3/uL (ref 0.7–4.0)
MCH: 36.3 pg — ABNORMAL HIGH (ref 26.0–34.0)
MCHC: 34.2 g/dL (ref 30.0–36.0)
MCV: 106.3 fL — ABNORMAL HIGH (ref 80.0–100.0)
Monocytes Absolute: 0.9 10*3/uL (ref 0.1–1.0)
Monocytes Relative: 20 %
Neutro Abs: 2.2 10*3/uL (ref 1.7–7.7)
Neutrophils Relative %: 47 %
Platelets: 168 10*3/uL (ref 150–400)
RBC: 2.56 MIL/uL — ABNORMAL LOW (ref 3.87–5.11)
RDW: 14.6 % (ref 11.5–15.5)
WBC: 4.7 10*3/uL (ref 4.0–10.5)
nRBC: 0 % (ref 0.0–0.2)

## 2022-07-30 LAB — COMPREHENSIVE METABOLIC PANEL
ALT: 18 U/L (ref 0–44)
AST: 29 U/L (ref 15–41)
Albumin: 4.1 g/dL (ref 3.5–5.0)
Alkaline Phosphatase: 75 U/L (ref 38–126)
Anion gap: 8 (ref 5–15)
BUN: 19 mg/dL (ref 8–23)
CO2: 26 mmol/L (ref 22–32)
Calcium: 9.2 mg/dL (ref 8.9–10.3)
Chloride: 108 mmol/L (ref 98–111)
Creatinine, Ser: 1.47 mg/dL — ABNORMAL HIGH (ref 0.44–1.00)
GFR, Estimated: 38 mL/min — ABNORMAL LOW (ref >60)
Glucose, Bld: 125 mg/dL — ABNORMAL HIGH (ref 70–99)
Potassium: 3.8 mmol/L (ref 3.5–5.1)
Sodium: 142 mmol/L (ref 135–145)
Total Bilirubin: 0.4 mg/dL (ref 0.3–1.2)
Total Protein: 7.4 g/dL (ref 6.5–8.1)

## 2022-07-30 LAB — MAGNESIUM: Magnesium: 1.6 mg/dL — ABNORMAL LOW (ref 1.7–2.4)

## 2022-07-30 MED ORDER — FULVESTRANT 250 MG/5ML IM SOSY
500.0000 mg | PREFILLED_SYRINGE | Freq: Once | INTRAMUSCULAR | Status: AC
  Administered 2022-07-30: 500 mg via INTRAMUSCULAR
  Filled 2022-07-30: qty 10

## 2022-07-30 NOTE — Progress Notes (Signed)
[place]  [phone] [phone]  Patient Care Team: [name], DO as PCP - General (Family Medicine) Pieter Partridge, DO as Consulting Physician (Neurology) Lloyd Huger, MD as Consulting Physician (Oncology)   Name of the patient: Cassidy Norris  [id number]  [birth year]   Date of visit: 07/30/22  HPI: Patient is a 71 y.o. female with recurrent breast cancer, ER/PR positive/HER2 negative. Currently treating with Kisqali (ribociclib) and fulvestrant. She started ribociclib on 01/08/22.  Reason for Consult: Oral chemotherapy follow-up for ribociclib therapy.   PAST MEDICAL HISTORY: Past Medical History:  Diagnosis Date   Anemia    Arthritis not really diagnosis but have pain   B12 deficiency 07/23/2016   Will check levels to determine if injections are needed again. Await results. Recent CBC at [facility]   Blood transfusion without reported diagnosis 1983 had a miscarriage/dnc   Cancer ([place]) 12/30/2013   Multifocal disease: pT1c,m, N0(isolated tumor cells) Er/ PR positive, Her 2 negative.   Cancer ([place]) 01/09/2014   Invasive lobular carcinoma, 2.2 cm, T2,N1 ER/PR positive, HER-2/neu negative   Cataract 2014?   Centrilobular emphysema ([place]) 70/26/3785   Complication of anesthesia 03/06/2022   Prolonged metabolism of Rocuronium during case.   Diabetes mellitus without complication ([place])    Diffuse cystic mastopathy    Erosive esophagitis    Essential hypertension, benign 09/24/2017   Family history of malignant neoplasm of gastrointestinal tract 2012   Fractured elbow 08/28/2014   Gastroesophageal reflux disease    Hx of metabolic acidosis with increased anion gap    Increased heart rate    Multiple sclerosis (HCC)    Neuromuscular disorder (HCC)    neuropathy in bil feet - left is worse.   Obesity, unspecified    Peripheral neuropathy    Personal history of tobacco use, presenting hazards  to health    Restless leg syndrome    Sleep apnea    uses CPAP   Special screening for malignant neoplasms, colon     HEMATOLOGY/ONCOLOGY HISTORY:  Oncology History Overview Note  1. Bilateral carcinoma of breast, February, 2015. Right breast status post radical mastectomy. T1 C. N0M0 (isolated tumor cells in one lymph node) multifocal invasive cancer. Stage IC, Left breast, Status post radical mastectomy, T2, N1, M0 tumor. Stage II, RIght breast. Both tumors are estrogen receptor positive.  Progesterone receptor positive.  HER-2 receptor negative by FISH. Negative for BRCA mutation.  2. Started on Adriamycin and Cytoxan on March 01, 2014. Last cycle of Cytoxan and Adriamycin on May 01, 2014. Patient has finished last chemotherapy on September 9. (12 th dose was omitted because of neuropathy) 3.  Taking tamoxifen.  Patient had a poor tolerance to letrozole.(October, 2015) 4.  Patient is taking letrozole since November of 2015 5.  May, 2016 Letrozole has been put on hold because of bony pains 6.  Started on Aromasin from July of 2016    History of bilateral breast cancer (Resolved)  11/29/2013 Initial Diagnosis   Breast cancer bilateral, multifocal ER/PR pos, Her 2 neg,.     ALLERGIES:  has No Known Allergies.  MEDICATIONS:  Current Outpatient Medications  Medication Sig Dispense Refill   baclofen (LIORESAL) 10 MG tablet Take 1 tablet (10 mg total) by mouth 3 (three) times daily as needed for muscle spasms. 1080 tablet 0   calcium carbonate (OSCAL) 1500 (600 Ca) MG TABS tablet Take by mouth 2 (two) times daily with a meal.  fluticasone (FLONASE) 50 MCG/ACT nasal spray PLACE 2 SPRAYS INTO BOTH NOSTRILS AT BEDTIME. 48 g 1   fulvestrant (FASLODEX) 250 MG/5ML injection Inject 500 mg into the muscle every 30 (thirty) days. One injection each buttock over 1-2 minutes. Warm prior to use.     Krill Oil 1000 MG CAPS Take 1,000 mg by mouth daily.     lisinopril (ZESTRIL) 5 MG tablet TAKE  1 TABLET BY MOUTH EVERY DAY IN THE EVENING 90 tablet 3   metFORMIN (GLUCOPHAGE) 500 MG tablet TAKE 1 TABLET BY MOUTH 2 TIMES DAILY WITH A MEAL. 180 tablet 2   omeprazole (PRILOSEC) 40 MG capsule TAKE 1 CAPSULE (40 MG TOTAL) BY MOUTH 2 (TWO) TIMES DAILY BEFORE A MEAL. 180 capsule 1   ribociclib succ (KISQALI, 600 MG DOSE,) 200 MG Therapy Pack Take 3 tablets (600 mg total) by mouth daily. Take for 21 days on, 7 days off, repeat every 28 days. 63 tablet 5   traZODone (DESYREL) 50 MG tablet TAKE 1 TABLET BY MOUTH EVERY DAY AT BEDTIME AS NEEDED FOR SLEEP 90 tablet 0   venlafaxine XR (EFFEXOR-XR) 75 MG 24 hr capsule Take 1 capsule (75 mg total) by mouth daily with breakfast. 360 capsule 0   vitamin B-12 (CYANOCOBALAMIN) 1000 MCG tablet Take 2,500 mcg by mouth every Monday, Wednesday, and Friday.     Vitamin D, Cholecalciferol, 50 MCG (2000 UT) CAPS Take 2,000 Units by mouth every Monday, Wednesday, and Friday.     acetaminophen (TYLENOL) 500 MG tablet Take 1,000 mg by mouth 2 (two) times daily as needed for moderate pain or headache. (Patient not taking: Reported on 07/30/2022)     diclofenac Sodium (VOLTAREN) 1 % GEL Apply 2 g topically 3 (three) times daily as needed. (Patient not taking: Reported on 07/30/2022) 100 g 2   famotidine (PEPCID) 20 MG tablet TAKE 1 TABLET BY MOUTH TWICE A DAY BEFORE MEALS (Patient not taking: Reported on 07/30/2022) 180 tablet 1   loperamide (IMODIUM A-D) 2 MG tablet Take 2 tablets (4 mg total) by mouth 4 (four) times daily as needed for diarrhea or loose stools. (Patient not taking: Reported on 07/30/2022) 30 tablet 0   prochlorperazine (COMPAZINE) 10 MG tablet Take 1 tablet (10 mg total) by mouth every 6 (six) hours as needed for nausea or vomiting. (Patient not taking: Reported on 07/30/2022) 20 tablet 1   sodium chloride (OCEAN) 0.65 % SOLN nasal spray Place 1 spray into both nostrils as needed for congestion. (Patient not taking: Reported on 07/30/2022)     No current  facility-administered medications for this visit.    VITAL SIGNS: BP 117/67 (BP Location: Left Arm, Patient Position: Sitting, Cuff Size: Normal)   Pulse 92   Temp 99.5 F (37.5 C) (Tympanic)   Resp 16   Ht 5' 4"  (1.626 m)   Wt 83.2 kg (183 lb 8 oz)   LMP 12/22/1999 (Approximate)   SpO2 98%   BMI 31.50 kg/m  Filed Weights   07/30/22 0928  Weight: 83.2 kg (183 lb 8 oz)    Estimated body mass index is 31.5 kg/m as calculated from the following:   Height as of this encounter: 5' 4"  (1.626 m).   Weight as of this encounter: 83.2 kg (183 lb 8 oz).  LABS: CBC:    Component Value Date/Time   WBC 4.7 07/30/2022 0914   HGB 9.3 (L) 07/30/2022 0914   HGB 15.5 07/13/2017 0937   HCT 27.2 (L) 07/30/2022 1470  HCT 46.7 (H) 07/13/2017 0937   PLT 168 07/30/2022 0914   PLT 211 07/13/2017 0937   MCV 106.3 (H) 07/30/2022 0914   MCV 94 07/13/2017 0937   MCV 93 03/11/2015 1356   NEUTROABS 2.2 07/30/2022 0914   NEUTROABS 5.4 07/13/2017 0937   NEUTROABS 5.6 03/11/2015 1356   LYMPHSABS 1.3 07/30/2022 0914   LYMPHSABS 2.1 07/13/2017 0937   LYMPHSABS 2.3 03/11/2015 1356   MONOABS 0.9 07/30/2022 0914   MONOABS 1.0 (H) 03/11/2015 1356   EOSABS 0.1 07/30/2022 0914   EOSABS 0.2 07/13/2017 0937   EOSABS 0.3 03/11/2015 1356   BASOSABS 0.1 07/30/2022 0914   BASOSABS 0.0 07/13/2017 0937   BASOSABS 0.1 03/11/2015 1356   Comprehensive Metabolic Panel:    Component Value Date/Time   NA 142 07/30/2022 0914   NA 142 07/14/2019 1516   NA 139 03/11/2015 1356   K 3.8 07/30/2022 0914   K 3.4 (L) 03/11/2015 1356   CL 108 07/30/2022 0914   CL 103 03/11/2015 1356   CO2 26 07/30/2022 0914   CO2 28 03/11/2015 1356   BUN 19 07/30/2022 0914   BUN 12 07/14/2019 1516   BUN 13 03/11/2015 1356   CREATININE 1.47 (H) 07/30/2022 0914   CREATININE 0.85 10/20/2021 0838   GLUCOSE 125 (H) 07/30/2022 0914   GLUCOSE 118 (H) 03/11/2015 1356   CALCIUM 9.2 07/30/2022 0914   CALCIUM 9.2 03/11/2015 1356   AST  29 07/30/2022 0914   AST 28 03/11/2015 1356   ALT 18 07/30/2022 0914   ALT 32 03/11/2015 1356   ALKPHOS 75 07/30/2022 0914   ALKPHOS 68 03/11/2015 1356   BILITOT 0.4 07/30/2022 0914   BILITOT 0.4 07/14/2019 1516   BILITOT 0.7 03/11/2015 1356   PROT 7.4 07/30/2022 0914   PROT 6.9 07/14/2019 1516   PROT 6.9 03/11/2015 1356   ALBUMIN 4.1 07/30/2022 0914   ALBUMIN 4.5 07/14/2019 1516   ALBUMIN 4.2 03/11/2015 1356     Present during today's visit: patient only  Assessment and Plan: CBC and CMP assessed, continue ribociclib 600 mg 21 days on/7 days off.  Patient will received her fulvestrant today Mag checked today slightly low, reviewed with patient foods she can eat that are high in magnesium, will recheck at next visit   Oral Chemotherapy Side Effect/Intolerance:  No reported diarrhea, fatigue, or nausea/vomiting on ribociclib  Other:  Patient is scheduled for cataract surgery on 9/21 and 10/12  Oral Chemotherapy Adherence: no missed dose reported No patient barriers to medication adherence identified.   New medications: none reported  Medication Access Issues: no issues, patient fills at [facility]    Patient expressed understanding and was in agreement with this plan. She also understands that She can call clinic at any time with any questions, concerns, or complaints.   Follow-up plan: RTC in 4 weeks  Thank you for allowing me to participate in the care of this very pleasant patient.   Time Total: 15 min  Visit consisted of counseling and education on dealing with issues of symptom management in the setting of serious and potentially life-threatening illness.Greater than 50%  of this time was spent counseling and coordinating care related to the above assessment and plan.  Signed by: Darl Pikes, PharmD, BCPS, Salley Slaughter, CPP Hematology/Oncology Clinical Pharmacist Practitioner [place]/DB/AP [facility] [phone]  07/30/2022 10:16 AM

## 2022-07-31 LAB — CANCER ANTIGEN 27.29: CA 27.29: 86.7 U/mL — ABNORMAL HIGH (ref 0.0–38.6)

## 2022-08-06 MED ORDER — RIBOCICLIB SUCC (600 MG DOSE) 200 MG PO TBPK: 600.0000 mg | ORAL_TABLET | Freq: Every day | ORAL | 5 refills | Status: DC

## 2022-08-06 NOTE — Addendum Note (Signed)
Addended by: Darl Pikes on: 08/06/2022 10:20 AM   Modules accepted: Orders

## 2022-08-13 DIAGNOSIS — H25812 Combined forms of age-related cataract, left eye: Secondary | ICD-10-CM | POA: Diagnosis not present

## 2022-08-13 DIAGNOSIS — H2512 Age-related nuclear cataract, left eye: Secondary | ICD-10-CM | POA: Diagnosis not present

## 2022-08-13 DIAGNOSIS — H269 Unspecified cataract: Secondary | ICD-10-CM | POA: Diagnosis not present

## 2022-08-13 HISTORY — PX: OTHER SURGICAL HISTORY: SHX169

## 2022-08-17 DIAGNOSIS — G4733 Obstructive sleep apnea (adult) (pediatric): Secondary | ICD-10-CM | POA: Diagnosis not present

## 2022-08-17 DIAGNOSIS — J301 Allergic rhinitis due to pollen: Secondary | ICD-10-CM | POA: Diagnosis not present

## 2022-08-24 ENCOUNTER — Ambulatory Visit (INDEPENDENT_AMBULATORY_CARE_PROVIDER_SITE_OTHER): Payer: Medicare Other | Admitting: Dermatology

## 2022-08-24 ENCOUNTER — Encounter: Payer: Self-pay | Admitting: Dermatology

## 2022-08-24 DIAGNOSIS — Z1283 Encounter for screening for malignant neoplasm of skin: Secondary | ICD-10-CM | POA: Diagnosis not present

## 2022-08-24 DIAGNOSIS — L578 Other skin changes due to chronic exposure to nonionizing radiation: Secondary | ICD-10-CM | POA: Diagnosis not present

## 2022-08-24 DIAGNOSIS — D044 Carcinoma in situ of skin of scalp and neck: Secondary | ICD-10-CM | POA: Diagnosis not present

## 2022-08-24 DIAGNOSIS — L2081 Atopic neurodermatitis: Secondary | ICD-10-CM

## 2022-08-24 DIAGNOSIS — L814 Other melanin hyperpigmentation: Secondary | ICD-10-CM

## 2022-08-24 DIAGNOSIS — D229 Melanocytic nevi, unspecified: Secondary | ICD-10-CM | POA: Diagnosis not present

## 2022-08-24 DIAGNOSIS — C50912 Malignant neoplasm of unspecified site of left female breast: Secondary | ICD-10-CM

## 2022-08-24 DIAGNOSIS — D1801 Hemangioma of skin and subcutaneous tissue: Secondary | ICD-10-CM

## 2022-08-24 DIAGNOSIS — L820 Inflamed seborrheic keratosis: Secondary | ICD-10-CM | POA: Diagnosis not present

## 2022-08-24 DIAGNOSIS — D492 Neoplasm of unspecified behavior of bone, soft tissue, and skin: Secondary | ICD-10-CM

## 2022-08-24 DIAGNOSIS — C4492 Squamous cell carcinoma of skin, unspecified: Secondary | ICD-10-CM

## 2022-08-24 DIAGNOSIS — C50911 Malignant neoplasm of unspecified site of right female breast: Secondary | ICD-10-CM | POA: Diagnosis not present

## 2022-08-24 HISTORY — DX: Squamous cell carcinoma of skin, unspecified: C44.92

## 2022-08-24 MED ORDER — MOMETASONE FUROATE 0.1 % EX CREA: TOPICAL_CREAM | CUTANEOUS | 2 refills | Status: DC

## 2022-08-24 NOTE — Patient Instructions (Addendum)
Start over the counter Cerave cream daily after bath or shower    Cryotherapy Aftercare  Wash gently with soap and water everyday.   Apply Vaseline and Band-Aid daily until healed.    Wound Care Instructions  Cleanse wound gently with soap and water once a day then pat dry with clean gauze. Apply a thin coat of Petrolatum (petroleum jelly, "Vaseline") over the wound (unless you have an allergy to this). We recommend that you use a new, sterile tube of Vaseline. Do not pick or remove scabs. Do not remove the yellow or white "healing tissue" from the base of the wound.  Cover the wound with fresh, clean, nonstick gauze and secure with paper tape. You may use Band-Aids in place of gauze and tape if the wound is small enough, but would recommend trimming much of the tape off as there is often too much. Sometimes Band-Aids can irritate the skin.  You should call the office for your biopsy report after 1 week if you have not already been contacted.  If you experience any problems, such as abnormal amounts of bleeding, swelling, significant bruising, significant pain, or evidence of infection, please call the office immediately.  FOR ADULT SURGERY PATIENTS: If you need something for pain relief you may take 1 extra strength Tylenol (acetaminophen) AND 2 Ibuprofen ('200mg'$  each) together every 4 hours as needed for pain. (do not take these if you are allergic to them or if you have a reason you should not take them.) Typically, you may only need pain medication for 1 to 3 days.        Due to recent changes in healthcare laws, you may see results of your pathology and/or laboratory studies on MyChart before the doctors have had a chance to review them. We understand that in some cases there may be results that are confusing or concerning to you. Please understand that not all results are received at the same time and often the doctors may need to interpret multiple results in order to provide you  with the best plan of care or course of treatment. Therefore, we ask that you please give [place] 2 business days to thoroughly review all your results before contacting the office for clarification. Should we see a critical lab result, you will be contacted sooner.   If You Need Anything After Your Visit  If you have any questions or concerns for your doctor, please call our main line at [phone] and press option 4 to reach your doctor's medical assistant. If no one answers, please leave a voicemail as directed and we will return your call as soon as possible. Messages left after 4 pm will be answered the following business day.   You may also send [place] a message via [place]. We typically respond to MyChart messages within 1-2 business days.  For prescription refills, please ask your pharmacy to contact our office. Our fax number is [phone].  If you have an urgent issue when the clinic is closed that cannot wait until the next business day, you can page your doctor at the number below.    Please note that while we do our best to be available for urgent issues outside of office hours, we are not available 24/7.   If you have an urgent issue and are unable to reach [place], you may choose to seek medical care at your doctor's office, retail clinic, urgent care center, or emergency room.  If you have a medical emergency, please immediately  call 911 or go to the emergency department.  Pager Numbers  - Dr. Nehemiah Massed: [phone]  - Dr. Laurence Ferrari: [phone]  - Dr. Nicole Kindred: [phone]  In the event of inclement weather, please call our main line at [phone] for an update on the status of any delays or closures.  Dermatology Medication Tips: Please keep the boxes that topical medications come in in order to help keep track of the instructions about where and how to use these. Pharmacies typically print the medication instructions only on the boxes and not directly on the medication tubes.    If your medication is too expensive, please contact our office at [phone] option 4 or send [place] a message through [place].   We are unable to tell what your co-pay for medications will be in advance as this is different depending on your insurance coverage. However, we may be able to find a substitute medication at lower cost or fill out paperwork to get insurance to cover a needed medication.   If a prior authorization is required to get your medication covered by your insurance company, please allow [place] 1-2 business days to complete this process.  Drug prices often vary depending on where the prescription is filled and some pharmacies may offer cheaper prices.  The website www.goodrx.com contains coupons for medications through different pharmacies. The prices here do not account for what the cost may be with help from insurance (it may be cheaper with your insurance), but the website can give you the price if you did not use any insurance.  - You can print the associated coupon and take it with your prescription to the pharmacy.  - You may also stop by our office during regular business hours and pick up a GoodRx coupon card.  - If you need your prescription sent electronically to a different pharmacy, notify our office through [facility] or by phone at [phone] option 4.     Si Usted Necesita Algo Despus de Su Visita  Tambin puede enviarnos un mensaje a travs de Pharmacist, community. Por lo general respondemos a los mensajes de MyChart en el transcurso de 1 a 2 das hbiles.  Para renovar recetas, por favor pida a su farmacia que se ponga en contacto con nuestra oficina. Harland Dingwall de fax es [place] [phone].  Si tiene un asunto urgente cuando la clnica est cerrada y que no puede esperar hasta el siguiente da hbil, puede llamar/localizar a su doctor(a) al nmero que aparece a continuacin.   Por favor, tenga en cuenta que aunque hacemos todo lo posible para estar  disponibles para asuntos urgentes fuera del horario de [place], no estamos disponibles las 24 horas del da, los 7 das de la [place].   Si tiene un problema urgente y no puede comunicarse con nosotros, puede optar por buscar atencin mdica  en el consultorio de su doctor(a), en una clnica privada, en un centro de atencin urgente o en una sala de emergencias.  Si tiene Engineering geologist, por favor llame inmediatamente al 911 o vaya a la sala de emergencias.  Nmeros de bper  - Dr. Nehemiah Massed: [phone]  - Dra. Moye: [phone]  - Dra. Nicole Kindred: [phone]  En caso de inclemencias del [place], por favor llame a Johnsie Kindred principal al [phone] para una actualizacin sobre el [place] de cualquier retraso o cierre.  Consejos para la medicacin en dermatologa: Por favor, guarde las cajas en las que vienen los medicamentos de uso tpico para ayudarle a seguir las instrucciones  sobre dnde y cmo usarlos. Las farmacias generalmente imprimen las instrucciones del medicamento slo en las cajas y no directamente en los tubos del [place].   Si su medicamento es muy caro, por favor, pngase en contacto con Zigmund Daniel llamando al [phone] y presione la opcin 4 o envenos un mensaje a travs de Pharmacist, community.   No podemos decirle cul ser su copago por los medicamentos por adelantado ya que esto es diferente dependiendo de la cobertura de su seguro. Sin embargo, es posible que podamos encontrar un medicamento sustituto a Electrical engineer un formulario para que el seguro cubra el medicamento que se considera necesario.   Si se requiere una autorizacin previa para que su compaa de seguros [place] su medicamento, por favor permtanos de 1 a 2 das hbiles para completar este proceso.  Los precios de los medicamentos varan con frecuencia dependiendo del Environmental consultant de dnde se surte la receta y alguna farmacias pueden ofrecer precios ms baratos.  El sitio web www.goodrx.com tiene  cupones para medicamentos de Airline pilot. Los precios aqu no tienen en cuenta lo que podra costar con la ayuda del seguro (puede ser ms barato con su seguro), pero el sitio web puede darle el precio si no utiliz Research scientist (physical sciences).  - Puede imprimir el cupn correspondiente y llevarlo con su receta a la farmacia.  - Tambin puede pasar por nuestra oficina durante el horario de atencin regular y Charity fundraiser una tarjeta de cupones de GoodRx.  - Si necesita que su receta se enve electrnicamente a una farmacia diferente, informe a nuestra oficina a travs de MyChart de [place] o por telfono llamando al [phone] y presione la opcin 4.

## 2022-08-24 NOTE — Progress Notes (Signed)
Follow-Up Visit   Subjective  Cassidy Norris is a 71 y.o. female who presents for the following: Annual Exam. Yearly mole check, recurrent breast cancer on the right and left breast taking Kisqali tablets and going for injections once a month. The patient presents for Total-Body Skin Exam (TBSE) for skin cancer screening and mole check.  The patient has spots, moles and lesions to be evaluated, some may be new or changing and the patient has concerns that these could be cancer.   The following portions of the chart were reviewed this encounter and updated as appropriate:   Tobacco  Allergies  Meds  Problems  Med Hx  Surg Hx  Fam Hx     Review of Systems:  No other skin or systemic complaints except as noted in HPI or Assessment and Plan.  Objective  Well appearing patient in no apparent distress; mood and affect are within normal limits.  A full examination was performed including scalp, head, eyes, ears, nose, lips, neck, chest, axillae, abdomen, back, buttocks, bilateral upper extremities, bilateral lower extremities, hands, feet, fingers, toes, fingernails, and toenails. All findings within normal limits unless otherwise noted below.  scalp 2.5 cm crusted patch       right arm x 2, right leg x 4  (6) (6) Stuck-on, waxy, tan-brown papules-- Discussed benign etiology and prognosis.   Right Lower Leg - Anterior Dry skin, Scaly erythematous papules and patches +/- dyspigmentation, lichenification, excoriations.    Assessment & Plan   Neoplasm of skin scalp  Epidermal / dermal shaving  Lesion diameter (cm):  2.5 Informed consent: discussed and consent obtained   Timeout: patient name, date of birth, surgical site, and procedure verified   Procedure prep:  Patient was prepped and draped in usual sterile fashion Prep type:  Isopropyl alcohol Anesthesia: the lesion was anesthetized in a standard fashion   Anesthetic:  1% lidocaine w/ epinephrine 1-100,000 buffered  w/ 8.4% NaHCO3 Hemostasis achieved with: pressure, aluminum chloride and electrodesiccation   Outcome: patient tolerated procedure well   Post-procedure details: sterile dressing applied and wound care instructions given   Dressing type: bandage and petrolatum    Destruction of lesion  Destruction method: electrodesiccation and curettage   Informed consent: discussed and consent obtained   Timeout:  patient name, date of birth, surgical site, and procedure verified Anesthesia: the lesion was anesthetized in a standard fashion   Anesthetic:  1% lidocaine w/ epinephrine 1-100,000 buffered w/ 8.4% NaHCO3 Curettage performed in three different directions: Yes   Electrodesiccation performed over the curetted area: Yes   Curettage cycles:  3 Lesion length (cm):  2.5 Lesion width (cm):  2.5 Margin per side (cm):  0.3 Final wound size (cm):  3.1 Hemostasis achieved with:  electrodesiccation Outcome: patient tolerated procedure well with no complications   Post-procedure details: sterile dressing applied and wound care instructions given   Dressing type: petrolatum    Specimen 1 - Surgical pathology Differential Diagnosis: RO Irritated Seborrheic keratosis  Check Margins: No  Inflamed seborrheic keratosis (6) right arm x 2, right leg x 4  (6) Symptomatic, irritating, patient would like treated.  Destruction of lesion - right arm x 2, right leg x 4  (6) Complexity: simple   Destruction method: cryotherapy   Informed consent: discussed and consent obtained   Timeout:  patient name, date of birth, surgical site, and procedure verified Lesion destroyed using liquid nitrogen: Yes   Region frozen until ice ball extended beyond lesion: Yes  Outcome: patient tolerated procedure well with no complications   Post-procedure details: wound care instructions given    Atopic neurodermatitis Right Lower Leg - Anterior Atopic dermatitis (eczema) is a chronic, relapsing, pruritic condition that  can significantly affect quality of life. It is often associated with allergic rhinitis and/or asthma and can require treatment with topical medications, phototherapy, or in severe cases biologic injectable medication (Dupixent; Adbry) or Oral JAK inhibitors.   Start Mometasone cream apply to affected skin qd-bid prn flares  Use otc Cerave moisturizer daily  Related Medications mometasone (ELOCON) 0.1 % cream Apply to affected skin qd-bid prn flares  Bilateral malignant neoplasm of breast in female, unspecified estrogen receptor status, unspecified site of breast ([place]) left breast, right breast Recurrent breast cancer  Continue treatment as directed by oncologist  - Recommend regular full body skin exams - Recommend daily broad spectrum sunscreen SPF 30+ to sun-exposed areas, reapply every 2 hours as needed.  Lentigines - Scattered tan macules - Due to sun exposure - Benign-appearing, observe - Recommend daily broad spectrum sunscreen SPF 30+ to sun-exposed areas, reapply every 2 hours as needed. - Call for any changes  Seborrheic Keratoses - Stuck-on, waxy, tan-brown papules and/or plaques  - Benign-appearing - Discussed benign etiology and prognosis. - Observe - Call for any changes  Melanocytic Nevi - Tan-brown and/or pink-flesh-colored symmetric macules and papules - Benign appearing on exam today - Observation - Call clinic for new or changing moles - Recommend daily use of broad spectrum spf 30+ sunscreen to sun-exposed areas.   Hemangiomas - Red papules - Discussed benign nature - Observe - Call for any changes  Actinic Damage - Chronic condition, secondary to cumulative UV/sun exposure - diffuse scaly erythematous macules with underlying dyspigmentation - Recommend daily broad spectrum sunscreen SPF 30+ to sun-exposed areas, reapply every 2 hours as needed.  - Staying in the shade or wearing long sleeves, sun glasses (UVA+UVB protection) and wide brim hats  (4-inch brim around the entire circumference of the hat) are also recommended for sun protection.  - Call for new or changing lesions.  Skin cancer screening performed today.   Return in about 1 year (around 08/25/2023) for TBSE, ISks .  IMarye Round, CMA, am acting as scribe for Sarina Ser, MD .  Documentation: I have reviewed the above documentation for accuracy and completeness, and I agree with the above.  Sarina Ser, MD

## 2022-08-25 ENCOUNTER — Other Ambulatory Visit: Payer: Self-pay

## 2022-08-25 ENCOUNTER — Telehealth: Payer: Self-pay

## 2022-08-25 DIAGNOSIS — Z853 Personal history of malignant neoplasm of breast: Secondary | ICD-10-CM

## 2022-08-25 NOTE — Telephone Encounter (Signed)
Patient called she was in the office yesterday and she had several places froze, today she have some blisters from the freezing, she would like to know if blisters are normal,  Discussed with -patient blisters are normal after LN2,`

## 2022-08-25 NOTE — Progress Notes (Unsigned)
[facility]  [phone]  Fax:([phone]) [place] DOB: 12-11-1950  MR#: [id number]  ZHY#:[id number]  Patient Care Team: [name], DO as PCP - General (Family Medicine) Pieter Partridge, DO as Consulting Physician (Neurology) Lloyd Huger, MD as Consulting Physician (Oncology)  CHIEF COMPLAINT: Recurrent, metastatic ER/PR, HER2 negative invasive carcinoma of the breast.    INTERVAL HISTORY: Patient returns to clinic today for further evaluation and continuation of Kisqali and fulvestrant.  She recently had cataract surgery.  She currently feels well and is asymptomatic.  She continues to tolerate her treatments without significant side effects.  She has no new neurologic complaints.  She denies any fevers.  She has a good appetite and denies weight loss.  She has no chest pain, shortness of breath, cough, or hemoptysis.  She denies any nausea, vomiting, or constipation. She has no urinary complaints.  Patient offers no specific complaints today.  REVIEW OF SYSTEMS:   Review of Systems  Constitutional: Negative.  Negative for fever, malaise/fatigue and weight loss.  Respiratory: Negative.  Negative for cough, hemoptysis and shortness of breath.   Cardiovascular: Negative.  Negative for chest pain and leg swelling.  Gastrointestinal: Negative.  Negative for abdominal pain.  Genitourinary: Negative.  Negative for dysuria.  Musculoskeletal: Negative.  Negative for joint pain and myalgias.  Skin: Negative.  Negative for rash.  Neurological: Negative.  Negative for dizziness, sensory change, focal weakness, weakness and headaches.  Psychiatric/Behavioral: Negative.  The patient is not nervous/anxious.     As per HPI. Otherwise, a complete review of systems is negative.  ONCOLOGY HISTORY: Oncology History Overview Note  1. Bilateral carcinoma of breast, February, 2015. Right breast status post radical mastectomy. T1 C. N0M0  (isolated tumor cells in one lymph node) multifocal invasive cancer. Stage IC, Left breast, Status post radical mastectomy, T2, N1, M0 tumor. Stage II, RIght breast. Both tumors are estrogen receptor positive.  Progesterone receptor positive.  HER-2 receptor negative by FISH. Negative for BRCA mutation.  2. Started on Adriamycin and Cytoxan on March 01, 2014. Last cycle of Cytoxan and Adriamycin on May 01, 2014. Patient has finished last chemotherapy on September 9. (12 th dose was omitted because of neuropathy) 3.  Taking tamoxifen.  Patient had a poor tolerance to letrozole.(October, 2015) 4.  Patient is taking letrozole since November of 2015 5.  May, 2016 Letrozole has been put on hold because of bony pains 6.  Started on Aromasin from July of 2016    History of bilateral breast cancer (Resolved)  11/29/2013 Initial Diagnosis   Breast cancer bilateral, multifocal ER/PR pos, Her 2 neg,.     PAST MEDICAL HISTORY: Past Medical History:  Diagnosis Date   Anemia    Arthritis not really diagnosis but have pain   B12 deficiency 07/23/2016   Will check levels to determine if injections are needed again. Await results. Recent CBC at Onc WNL   Blood transfusion without reported diagnosis 1983 had a miscarriage/dnc   Cancer ([place]) 12/30/2013   Multifocal disease: pT1c,m, N0(isolated tumor cells) Er/ PR positive, Her 2 negative.   Cancer ([place]) 01/09/2014   Invasive lobular carcinoma, 2.2 cm, T2,N1 ER/PR positive, HER-2/neu negative   Cataract 2014?   Centrilobular emphysema ([place]) 29/52/8413   Complication of anesthesia 03/06/2022   Prolonged metabolism of Rocuronium during case.   Diabetes mellitus without complication ([place])    Diffuse cystic mastopathy    Erosive esophagitis    Essential  hypertension, benign 09/24/2017   Family history of malignant neoplasm of gastrointestinal tract 2012   Fractured elbow 08/28/2014   Gastroesophageal reflux disease    Hx of metabolic acidosis with  increased anion gap    Increased heart rate    Multiple sclerosis (HCC)    Neuromuscular disorder (HCC)    neuropathy in bil feet - left is worse.   Obesity, unspecified    Peripheral neuropathy    Personal history of tobacco use, presenting hazards to health    Restless leg syndrome    Sleep apnea    uses CPAP   Special screening for malignant neoplasms, colon    Squamous cell carcinoma of skin 08/24/2022   SCCIS - scalp, ED&C    PAST SURGICAL HISTORY: Past Surgical History:  Procedure Laterality Date   BREAST BIOPSY Right 1973,2001   BREAST BIOPSY Right 11/07/2013   INVASIVE MAMMARY CARCINOMA , ER/PR positive Her2 negative   BREAST BIOPSY Left 11/27/2013   invasive lobular and DCIS   BREAST SURGERY Bilateral 01/09/2014   mastectomy   cataract surgery Left 08/13/2022   CHOLECYSTECTOMY     COLONOSCOPY  2010   Dr. Jamal Collin   COLONOSCOPY WITH PROPOFOL N/A 06/11/2015   Procedure: COLONOSCOPY WITH PROPOFOL;  Surgeon: Christene Lye, MD;  Location: [facility] ENDOSCOPY;  Service: Endoscopy;  Laterality: N/A;   COLONOSCOPY WITH PROPOFOL N/A 12/23/2021   Procedure: COLONOSCOPY WITH PROPOFOL;  Surgeon: Lin Landsman, MD;  Location: [facility] ENDOSCOPY;  Service: Gastroenterology;  Laterality: N/A;   DILATATION & CURETTAGE/HYSTEROSCOPY WITH MYOSURE N/A 07/12/2018   Procedure: DILATATION & CURETTAGE/HYSTEROSCOPY WITH MYOSURE WITH ENDOMETRIAL POLYPECTOMY;  Surgeon: Will Bonnet, MD;  Location: [facility] ORS;  Service: Gynecology;  Laterality: N/A;   DILATION AND CURETTAGE OF UTERUS  1983   ESOPHAGOGASTRODUODENOSCOPY N/A 12/23/2021   Procedure: ESOPHAGOGASTRODUODENOSCOPY (EGD);  Surgeon: Lin Landsman, MD;  Location: [facility] ENDOSCOPY;  Service: Gastroenterology;  Laterality: N/A;   JOINT REPLACEMENT  11/23/2015   KNEE ARTHROPLASTY Right 03/06/2022   Procedure: COMPUTER ASSISTED TOTAL KNEE ARTHROPLASTY;  Surgeon: Dereck Leep, MD;  Location: [facility] ORS;  Service: Orthopedics;   Laterality: Right;   POLYPECTOMY  1989   PORTA CATH REMOVAL     PORTACATH PLACEMENT  2015   SKIN BIOPSY     on scalp   SPINE SURGERY  09/14/2014   Ruptured disk L4- L5   TOTAL KNEE ARTHROPLASTY Left 11/22/2015   Procedure: TOTAL KNEE ARTHROPLASTY;  Surgeon: Earlie Server, MD;  Location: [place];  Service: Orthopedics;  Laterality: Left;   TUBAL LIGATION     WISDOM TOOTH EXTRACTION  2006    FAMILY HISTORY Family History  Problem Relation Age of Onset   Cancer Mother        lung   COPD Mother    Cancer Father        colon, stomach   Diabetes Maternal Grandmother    Hypertension Sister    Rectal cancer Paternal Uncle    Rectal cancer Paternal Uncle     GYNECOLOGIC HISTORY:  Patient's last menstrual period was 12/22/1999 (approximate).     ADVANCED DIRECTIVES:    HEALTH MAINTENANCE: Social History   Tobacco Use   Smoking status: Former    Packs/day: 1.00    Years: 30.00    Total pack years: 30.00    Types: Cigarettes    Quit date: 12/11/2013    Years since quitting: 8.7   Smokeless tobacco: Former  Scientific laboratory technician Use: Never used  Substance Use Topics   Alcohol use: Not Currently   Drug use: No     Colonoscopy:  PAP:  Bone density:  Mammogram:  No Known Allergies  Current Outpatient Medications  Medication Sig Dispense Refill   baclofen (LIORESAL) 10 MG tablet Take 1 tablet (10 mg total) by mouth 3 (three) times daily as needed for muscle spasms. 1080 tablet 0   calcium carbonate (OSCAL) 1500 (600 Ca) MG TABS tablet Take by mouth 2 (two) times daily with a meal.     fluticasone (FLONASE) 50 MCG/ACT nasal spray PLACE 2 SPRAYS INTO BOTH NOSTRILS AT BEDTIME. 48 g 1   fulvestrant (FASLODEX) 250 MG/5ML injection Inject 500 mg into the muscle every 30 (thirty) days. One injection each buttock over 1-2 minutes. Warm prior to use.     Krill Oil 1000 MG CAPS Take 1,000 mg by mouth daily.     lisinopril (ZESTRIL) 5 MG tablet TAKE 1 TABLET BY MOUTH EVERY DAY IN  THE EVENING 90 tablet 3   metFORMIN (GLUCOPHAGE) 500 MG tablet TAKE 1 TABLET BY MOUTH 2 TIMES DAILY WITH A MEAL. 180 tablet 2   mometasone (ELOCON) 0.1 % cream Apply to affected skin qd-bid prn flares 45 g 2   ribociclib succ (KISQALI, 600 MG DOSE,) 200 MG Therapy Pack Take 3 tablets (600 mg total) by mouth daily. Take for 21 days on, 7 days off, repeat every 28 days. 63 tablet 5   traZODone (DESYREL) 50 MG tablet TAKE 1 TABLET BY MOUTH EVERY DAY AT BEDTIME AS NEEDED FOR SLEEP 90 tablet 0   venlafaxine XR (EFFEXOR-XR) 75 MG 24 hr capsule Take 1 capsule (75 mg total) by mouth daily with breakfast. 360 capsule 0   vitamin B-12 (CYANOCOBALAMIN) 1000 MCG tablet Take 2,500 mcg by mouth every Monday, Wednesday, and Friday.     Vitamin D, Cholecalciferol, 50 MCG (2000 UT) CAPS Take 2,000 Units by mouth every Monday, Wednesday, and Friday.     acetaminophen (TYLENOL) 500 MG tablet Take 1,000 mg by mouth 2 (two) times daily as needed for moderate pain or headache. (Patient not taking: Reported on 07/30/2022)     diclofenac Sodium (VOLTAREN) 1 % GEL Apply 2 g topically 3 (three) times daily as needed. (Patient not taking: Reported on 07/30/2022) 100 g 2   famotidine (PEPCID) 20 MG tablet TAKE 1 TABLET BY MOUTH TWICE A DAY BEFORE MEALS (Patient not taking: Reported on 07/30/2022) 180 tablet 1   loperamide (IMODIUM A-D) 2 MG tablet Take 2 tablets (4 mg total) by mouth 4 (four) times daily as needed for diarrhea or loose stools. (Patient not taking: Reported on 07/30/2022) 30 tablet 0   omeprazole (PRILOSEC) 40 MG capsule TAKE 1 CAPSULE (40 MG TOTAL) BY MOUTH 2 (TWO) TIMES DAILY BEFORE A MEAL. 180 capsule 1   prochlorperazine (COMPAZINE) 10 MG tablet Take 1 tablet (10 mg total) by mouth every 6 (six) hours as needed for nausea or vomiting. (Patient not taking: Reported on 07/30/2022) 20 tablet 1   sodium chloride (OCEAN) 0.65 % SOLN nasal spray Place 1 spray into both nostrils as needed for congestion. (Patient not taking:  Reported on 07/30/2022)     No current facility-administered medications for this visit.    OBJECTIVE: BP (!) 144/70 (BP Location: Left Arm, Patient Position: Sitting)   Pulse 98   Temp 98.6 F (37 C) (Tympanic)   Ht 5' 4"  (1.626 m)   Wt 184 lb (83.5 kg)   LMP 12/22/1999 (Approximate)  SpO2 100%   BMI 31.58 kg/m    Body mass index is 31.58 kg/m.    ECOG FS:0 - Asymptomatic  General: Well-developed, well-nourished, no acute distress. Eyes: Pink conjunctiva, anicteric sclera. HEENT: Normocephalic, moist mucous membranes. Breast: Bilateral mastectomy. Lungs: No audible wheezing or coughing. Heart: Regular rate and rhythm. Abdomen: Soft, nontender, no obvious distention. Musculoskeletal: No edema, cyanosis, or clubbing. Neuro: Alert, answering all questions appropriately. Cranial nerves grossly intact. Skin: No rashes or petechiae noted. Psych: Normal affect.  LAB RESULTS:  Appointment on 08/27/2022  Component Date Value Ref Range Status   Magnesium 08/27/2022 1.7  1.7 - 2.4 mg/dL Final   Performed at [facility], [address] [place], [place] [address]   WBC 08/27/2022 4.5  4.0 - 10.5 K/uL Final   RBC 08/27/2022 2.39 (L)  3.87 - 5.11 MIL/uL Final   Hemoglobin 08/27/2022 8.7 (L)  12.0 - 15.0 g/dL Final   HCT 08/27/2022 25.4 (L)  36.0 - 46.0 % Final   MCV 08/27/2022 106.3 (H)  80.0 - 100.0 fL Final   MCH 08/27/2022 36.4 (H)  26.0 - 34.0 pg Final   MCHC 08/27/2022 34.3  30.0 - 36.0 g/dL Final   RDW 08/27/2022 15.6 (H)  11.5 - 15.5 % Final   Platelets 08/27/2022 188  150 - 400 K/uL Final   nRBC 08/27/2022 0.0  0.0 - 0.2 % Final   Neutrophils Relative % 08/27/2022 45  % Final   Neutro Abs 08/27/2022 2.1  1.7 - 7.7 K/uL Final   Lymphocytes Relative 08/27/2022 31  % Final   Lymphs Abs 08/27/2022 1.4  0.7 - 4.0 K/uL Final   Monocytes Relative 08/27/2022 17  % Final   Monocytes Absolute 08/27/2022 0.8  0.1 - 1.0 K/uL Final   Eosinophils Relative 08/27/2022 4  % Final    Eosinophils Absolute 08/27/2022 0.2  0.0 - 0.5 K/uL Final   Basophils Relative 08/27/2022 3  % Final   Basophils Absolute 08/27/2022 0.1  0.0 - 0.1 K/uL Final   Immature Granulocytes 08/27/2022 0  % Final   Abs Immature Granulocytes 08/27/2022 0.02  0.00 - 0.07 K/uL Final   Performed at [facility], [address] [place], [place] [address]   Sodium 08/27/2022 141  135 - 145 mmol/L Final   Potassium 08/27/2022 3.4 (L)  3.5 - 5.1 mmol/L Final   Chloride 08/27/2022 106  98 - 111 mmol/L Final   CO2 08/27/2022 27  22 - 32 mmol/L Final   Glucose, Bld 08/27/2022 124 (H)  70 - 99 mg/dL Final   Glucose reference range applies only to samples taken after fasting for at least 8 hours.   BUN 08/27/2022 16  8 - 23 mg/dL Final   Creatinine, Ser 08/27/2022 1.47 (H)  0.44 - 1.00 mg/dL Final   Calcium 08/27/2022 8.7 (L)  8.9 - 10.3 mg/dL Final   Total Protein 08/27/2022 7.4  6.5 - 8.1 g/dL Final   Albumin 08/27/2022 4.1  3.5 - 5.0 g/dL Final   AST 08/27/2022 25  15 - 41 U/L Final   ALT 08/27/2022 15  0 - 44 U/L Final   Alkaline Phosphatase 08/27/2022 69  38 - 126 U/L Final   Total Bilirubin 08/27/2022 0.4  0.3 - 1.2 mg/dL Final   GFR, Estimated 08/27/2022 38 (L)  >60 mL/min Final   Comment: (NOTE) Calculated using the CKD-EPI Creatinine Equation (2021)    Anion gap 08/27/2022 8  5 - 15 Final   Performed  at [facility], [address] [place], [place] [address]    STUDIES: No results found.  ONCOLOGY HISTORY:  Bilateral adenocarcinoma of the breast. Patient is status post bilateral mastectomy in February 2015. She also received adjuvant chemotherapy with Adriamycin, Cytoxan, and Taxol completing treatment in approximately October 2015. She then initiated letrozole, but could not tolerate secondary to leg pain and was switched to Aromasin.  Patient then switched to tamoxifen in October 2019 secondary to cost.  Patient subsequently discontinued tamoxifen and did not complete the  recommended 5 years of treatment.   ASSESSMENT: Recurrent, metastatic ER/PR, HER2 negative invasive carcinoma of the breast.   PLAN:    1. Recurrent, metastatic ER/PR, HER2 negative invasive carcinoma of the breast: See oncology history as above.  Biopsy confirmed recurrent disease.  PET scan results from December 26, 2021 reviewed independently with hypermetabolic left supraclavicular, subpectoral, and axillary lymphadenopathy consistent with nodal recurrence of patient's history of breast cancer.  No distant metastases were identified.  Repeat PET scan on April 23, 2022 reviewed independently with significant improvement of patient's disease burden.  Her initial CA 27-29 was reported 260.1, this is now decreased to 86.7.  Today's result is pending. Continue Kisqali 600 mg daily for 21 days with 7 days off.  Proceed with fulvestrant today.  Return to clinic in 4 weeks for further evaluation and continuation of treatment.   2.  Osteopenia: Patient's last bone mineral density was on August 01, 2016 and revealed a T-score of -1.2. Continue monitoring bone mineral density by PCP. 3.  Multiple sclerosis: Chronic.  Continue evaluation and treatment per neurology.  4.  Esophageal discomfort/mild dysphagia: Patient does not complain of this today.   Recent colonoscopy and EGD did not reveal any significant pathology. 5.  Renal insufficiency: Chronic and unchanged.  Patient's GFR is stable at 38.  Kisqali needs to be dose reduced if GFR falls below 30. 6.  Anemia: Hemoglobin slowly trending down and is now 8.7.  Continue to monitor closely.  Consider transfusion if falls below 8.0.   7.  Diarrhea: Patient does not complain of this today.  Continue Imodium as needed. 8.  Hypomagnesia: Resolved. 9.  Hypokalemia: Mild, monitor.   Patient expressed understanding and was in agreement with this plan. She also understands that She can call clinic at any time with any questions, concerns, or complaints.   Breast  cancer bilateral, multifocal ER/PR pos, Her 2 neg,.   Staging form: Breast, AJCC 7th Edition     Clinical: Stage IIB (T2, N1, M0) - Signed by Forest Gleason, MD on 04/22/2015   Lloyd Huger, MD   08/27/2022 4:33 PM

## 2022-08-26 ENCOUNTER — Telehealth: Payer: Self-pay

## 2022-08-26 NOTE — Telephone Encounter (Signed)
Left voicemail for patient to return my call. 

## 2022-08-26 NOTE — Telephone Encounter (Signed)
-----   Message from [name], MD sent at 08/25/2022  6:43 PM EDT ----- Diagnosis Skin , scalp SQUAMOUS CELL CARCINOMA IN SITU ARISING IN A SEBORRHEIC KERATOSIS   Cancer - SCC in situ Superficial Already treated Recheck next visit

## 2022-08-26 NOTE — Telephone Encounter (Signed)
Patient notified of bx results  

## 2022-08-27 ENCOUNTER — Inpatient Hospital Stay (HOSPITAL_BASED_OUTPATIENT_CLINIC_OR_DEPARTMENT_OTHER): Payer: Medicare Other | Admitting: Oncology

## 2022-08-27 ENCOUNTER — Inpatient Hospital Stay: Payer: Medicare Other | Attending: Oncology

## 2022-08-27 ENCOUNTER — Inpatient Hospital Stay: Payer: Medicare Other

## 2022-08-27 ENCOUNTER — Encounter: Payer: Self-pay | Admitting: Oncology

## 2022-08-27 VITALS — BP 144/70 | HR 98 | Temp 98.6°F | Ht 64.0 in | Wt 184.0 lb

## 2022-08-27 DIAGNOSIS — G35 Multiple sclerosis: Secondary | ICD-10-CM | POA: Insufficient documentation

## 2022-08-27 DIAGNOSIS — M8580 Other specified disorders of bone density and structure, unspecified site: Secondary | ICD-10-CM | POA: Diagnosis not present

## 2022-08-27 DIAGNOSIS — R1310 Dysphagia, unspecified: Secondary | ICD-10-CM | POA: Diagnosis not present

## 2022-08-27 DIAGNOSIS — D649 Anemia, unspecified: Secondary | ICD-10-CM | POA: Insufficient documentation

## 2022-08-27 DIAGNOSIS — Z170 Estrogen receptor positive status [ER+]: Secondary | ICD-10-CM | POA: Insufficient documentation

## 2022-08-27 DIAGNOSIS — C50912 Malignant neoplasm of unspecified site of left female breast: Secondary | ICD-10-CM | POA: Insufficient documentation

## 2022-08-27 DIAGNOSIS — Z79899 Other long term (current) drug therapy: Secondary | ICD-10-CM | POA: Insufficient documentation

## 2022-08-27 DIAGNOSIS — E876 Hypokalemia: Secondary | ICD-10-CM | POA: Insufficient documentation

## 2022-08-27 DIAGNOSIS — Z79811 Long term (current) use of aromatase inhibitors: Secondary | ICD-10-CM | POA: Diagnosis not present

## 2022-08-27 DIAGNOSIS — C50919 Malignant neoplasm of unspecified site of unspecified female breast: Secondary | ICD-10-CM

## 2022-08-27 DIAGNOSIS — C50911 Malignant neoplasm of unspecified site of right female breast: Secondary | ICD-10-CM | POA: Insufficient documentation

## 2022-08-27 DIAGNOSIS — Z5111 Encounter for antineoplastic chemotherapy: Secondary | ICD-10-CM | POA: Diagnosis not present

## 2022-08-27 DIAGNOSIS — Z9013 Acquired absence of bilateral breasts and nipples: Secondary | ICD-10-CM | POA: Insufficient documentation

## 2022-08-27 DIAGNOSIS — Z853 Personal history of malignant neoplasm of breast: Secondary | ICD-10-CM

## 2022-08-27 LAB — CBC WITH DIFFERENTIAL/PLATELET
Abs Immature Granulocytes: 0.02 10*3/uL (ref 0.00–0.07)
Basophils Absolute: 0.1 10*3/uL (ref 0.0–0.1)
Basophils Relative: 3 %
Eosinophils Absolute: 0.2 10*3/uL (ref 0.0–0.5)
Eosinophils Relative: 4 %
HCT: 25.4 % — ABNORMAL LOW (ref 36.0–46.0)
Hemoglobin: 8.7 g/dL — ABNORMAL LOW (ref 12.0–15.0)
Immature Granulocytes: 0 %
Lymphocytes Relative: 31 %
Lymphs Abs: 1.4 10*3/uL (ref 0.7–4.0)
MCH: 36.4 pg — ABNORMAL HIGH (ref 26.0–34.0)
MCHC: 34.3 g/dL (ref 30.0–36.0)
MCV: 106.3 fL — ABNORMAL HIGH (ref 80.0–100.0)
Monocytes Absolute: 0.8 10*3/uL (ref 0.1–1.0)
Monocytes Relative: 17 %
Neutro Abs: 2.1 10*3/uL (ref 1.7–7.7)
Neutrophils Relative %: 45 %
Platelets: 188 10*3/uL (ref 150–400)
RBC: 2.39 MIL/uL — ABNORMAL LOW (ref 3.87–5.11)
RDW: 15.6 % — ABNORMAL HIGH (ref 11.5–15.5)
WBC: 4.5 10*3/uL (ref 4.0–10.5)
nRBC: 0 % (ref 0.0–0.2)

## 2022-08-27 LAB — COMPREHENSIVE METABOLIC PANEL
ALT: 15 U/L (ref 0–44)
AST: 25 U/L (ref 15–41)
Albumin: 4.1 g/dL (ref 3.5–5.0)
Alkaline Phosphatase: 69 U/L (ref 38–126)
Anion gap: 8 (ref 5–15)
BUN: 16 mg/dL (ref 8–23)
CO2: 27 mmol/L (ref 22–32)
Calcium: 8.7 mg/dL — ABNORMAL LOW (ref 8.9–10.3)
Chloride: 106 mmol/L (ref 98–111)
Creatinine, Ser: 1.47 mg/dL — ABNORMAL HIGH (ref 0.44–1.00)
GFR, Estimated: 38 mL/min — ABNORMAL LOW (ref >60)
Glucose, Bld: 124 mg/dL — ABNORMAL HIGH (ref 70–99)
Potassium: 3.4 mmol/L — ABNORMAL LOW (ref 3.5–5.1)
Sodium: 141 mmol/L (ref 135–145)
Total Bilirubin: 0.4 mg/dL (ref 0.3–1.2)
Total Protein: 7.4 g/dL (ref 6.5–8.1)

## 2022-08-27 LAB — MAGNESIUM: Magnesium: 1.7 mg/dL (ref 1.7–2.4)

## 2022-08-27 MED ORDER — FULVESTRANT 250 MG/5ML IM SOSY
500.0000 mg | PREFILLED_SYRINGE | Freq: Once | INTRAMUSCULAR | Status: AC
  Administered 2022-08-27: 500 mg via INTRAMUSCULAR
  Filled 2022-08-27: qty 10

## 2022-08-28 LAB — CANCER ANTIGEN 27.29: CA 27.29: 90.7 U/mL — ABNORMAL HIGH (ref 0.0–38.6)

## 2022-08-29 ENCOUNTER — Other Ambulatory Visit: Payer: Self-pay | Admitting: Family Medicine

## 2022-08-29 DIAGNOSIS — K219 Gastro-esophageal reflux disease without esophagitis: Secondary | ICD-10-CM

## 2022-08-31 NOTE — Telephone Encounter (Signed)
Requested Prescriptions  Pending Prescriptions Disp Refills  . famotidine (PEPCID) 20 MG tablet [Pharmacy Med Name: FAMOTIDINE 20 MG TABLET] 180 tablet 1    Sig: TAKE 1 TABLET BY MOUTH TWICE A DAY BEFORE MEALS     Gastroenterology:  H2 Antagonists Passed - 08/29/2022 12:19 PM      Passed - Valid encounter within last 12 months    Recent Outpatient Visits          3 months ago Pruritic erythematous rash   [place], DO   4 months ago Type 2 diabetes mellitus with other specified complication, without long-term current use of insulin [facility])   [place], DO   10 months ago Annual physical exam   [place], DO   1 year ago Chest wall pain   [place], DO   1 year ago Type 2 diabetes mellitus with other specified complication, without long-term current use of insulin ([place])   [facility] [name], [name], DO      Future Appointments            In 1 month [name], [name], DO [facility], [place]   In 1 year [name], MD [place]

## 2022-09-03 DIAGNOSIS — H2511 Age-related nuclear cataract, right eye: Secondary | ICD-10-CM | POA: Diagnosis not present

## 2022-09-03 DIAGNOSIS — H269 Unspecified cataract: Secondary | ICD-10-CM | POA: Diagnosis not present

## 2022-09-03 DIAGNOSIS — H25811 Combined forms of age-related cataract, right eye: Secondary | ICD-10-CM | POA: Diagnosis not present

## 2022-09-10 ENCOUNTER — Other Ambulatory Visit: Payer: Medicare Other

## 2022-09-10 ENCOUNTER — Ambulatory Visit: Payer: Medicare Other | Admitting: Oncology

## 2022-09-10 ENCOUNTER — Ambulatory Visit: Payer: Medicare Other

## 2022-09-12 ENCOUNTER — Other Ambulatory Visit: Payer: Self-pay | Admitting: Family Medicine

## 2022-09-12 DIAGNOSIS — G4700 Insomnia, unspecified: Secondary | ICD-10-CM

## 2022-09-14 NOTE — Telephone Encounter (Signed)
Requested Prescriptions  Pending Prescriptions Disp Refills  . traZODone (DESYREL) 50 MG tablet [Pharmacy Med Name: TRAZODONE 50 MG TABLET] 90 tablet 0    Sig: TAKE 1 TABLET BY MOUTH AT BEDTIME AS NEEDED FOR SLEEP     Psychiatry: Antidepressants - Serotonin Modulator Passed - 09/12/2022  9:09 AM      Passed - Valid encounter within last 6 months    Recent Outpatient Visits          3 months ago Pruritic erythematous rash   [place], DO   4 months ago Type 2 diabetes mellitus with other specified complication, without long-term current use of insulin [facility])   [place], DO   10 months ago Annual physical exam   [place], DO   1 year ago Chest wall pain   [place], DO   1 year ago Type 2 diabetes mellitus with other specified complication, without long-term current use of insulin ([place])   [facility] [name], [name], DO      Future Appointments            In 1 month [name], [name], DO [facility], [place]   In 1 year [name], MD [place]

## 2022-09-17 ENCOUNTER — Encounter: Payer: Self-pay | Admitting: Family Medicine

## 2022-09-21 ENCOUNTER — Other Ambulatory Visit: Payer: Self-pay | Admitting: Family Medicine

## 2022-09-21 DIAGNOSIS — F5101 Primary insomnia: Secondary | ICD-10-CM

## 2022-09-21 DIAGNOSIS — E1169 Type 2 diabetes mellitus with other specified complication: Secondary | ICD-10-CM

## 2022-09-22 NOTE — Telephone Encounter (Signed)
Metformin: last RF 01/23/22 #180 2 RF   Trazodone dc'd 03/02/22 Ashley Royalty CMA- dose change  Requested Prescriptions  Refused Prescriptions Disp Refills  . metFORMIN (GLUCOPHAGE) 500 MG tablet [Pharmacy Med Name: METFORMIN HCL 500 MG TABLET] 180 tablet 2    Sig: TAKE 1 TABLET BY MOUTH TWICE A DAY WITH MEALS     Endocrinology:  Diabetes - Biguanides Failed - 09/21/2022  3:55 PM      Failed - Cr in normal range and within 360 days    Creat  Date Value Ref Range Status  10/20/2021 0.85 0.50 - 1.05 mg/dL Final   Creatinine, Ser  Date Value Ref Range Status  08/27/2022 1.47 (H) 0.44 - 1.00 mg/dL Final         Failed - eGFR in normal range and within 360 days    GFR, Est African American  Date Value Ref Range Status  10/15/2020 72 > OR = 60 mL/min/1.67m Final   GFR, Est Non African American  Date Value Ref Range Status  10/15/2020 62 > OR = 60 mL/min/1.780mFinal   GFR, Estimated  Date Value Ref Range Status  08/27/2022 38 (L) >60 mL/min Final    Comment:    (NOTE) Calculated using the CKD-EPI Creatinine Equation (2021)    GFR  Date Value Ref Range Status  12/28/2019 62.40 >60.00 mL/min Final   eGFR  Date Value Ref Range Status  10/20/2021 74 > OR = 60 mL/min/1.7383minal    Comment:    The eGFR is based on the CKD-EPI 2021 equation. To calculate  the new eGFR from a previous Creatinine or Cystatin C result, go to https://www.kidney.org/professionals/ kdoqi/gfr%5Fcalculator          Failed - B12 Level in normal range and within 720 days    Vitamin B-12  Date Value Ref Range Status  10/20/2021 >2,000 (H) 200 - 1,100 pg/mL Final         Passed - HBA1C is between 0 and 7.9 and within 180 days    Hemoglobin A1C  Date Value Ref Range Status  04/28/2022 5.9 (A) 4.0 - 5.6 % Final   HB A1C (BAYER DCA - WAIVED)  Date Value Ref Range Status  07/13/2017 6.5 <7.0 % Final    Comment:                                          Diabetic Adult            <7.0                                        Healthy Adult        4.3 - 5.7                                                           (DCCT/NGSP) American Diabetes Association's Summary of Glycemic Recommendations for Adults with Diabetes: Hemoglobin A1c <7.0%. More stringent glycemic goals (A1c <6.0%) may further reduce complications at the cost of increased risk of hypoglycemia.    Hgb A1c MFr Bld  Date Value Ref Range Status  02/23/2022 6.2 (H) 4.8 - 5.6 % Final    Comment:    (NOTE)         Prediabetes: 5.7 - 6.4         Diabetes: >6.4         Glycemic control for adults with diabetes: <7.0          Passed - Valid encounter within last 6 months    Recent Outpatient Visits          4 months ago Pruritic erythematous rash   [place], DO   4 months ago Type 2 diabetes mellitus with other specified complication, without long-term current use of insulin ([place])   [facility], [name], DO   11 months ago Annual physical exam   [place], DO   1 year ago Chest wall pain   [facility] [place], [name], DO   1 year ago Type 2 diabetes mellitus with other specified complication, without long-term current use of insulin ([place])   [facility] [place], [name], DO      Future Appointments            In 1 month [name], [name], DO [facility], [place]   In 11 months [name], MD [place] within normal limits and completed in the last 12 months    WBC  Date Value Ref Range Status  08/27/2022 4.5 4.0 - 10.5 K/uL Final   RBC  Date Value Ref Range Status  08/27/2022 2.39 (L) 3.87 - 5.11 MIL/uL Final   Hemoglobin  Date Value Ref Range Status  08/27/2022 8.7 (L) 12.0 - 15.0 g/dL Final  07/13/2017 15.5 11.1 - 15.9 g/dL Final   HCT  Date Value Ref Range Status  08/27/2022  25.4 (L) 36.0 - 46.0 % Final   Hematocrit  Date Value Ref Range Status  07/13/2017 46.7 (H) 34.0 - 46.6 % Final   MCHC  Date Value Ref Range Status  08/27/2022 34.3 30.0 - 36.0 g/dL Final   [facility]  Date Value Ref Range Status  08/27/2022 36.4 (H) 26.0 - 34.0 pg Final   MCV  Date Value Ref Range Status  08/27/2022 106.3 (H) 80.0 - 100.0 fL Final  07/13/2017 94 79 - 97 fL Final  03/11/2015 93 80 - 100 fL Final   No results found for: "PLTCOUNTKUC", "LABPLAT", "POCPLA" RDW  Date Value Ref Range Status  08/27/2022 15.6 (H) 11.5 - 15.5 % Final  07/13/2017 13.1 12.3 - 15.4 % Final  03/11/2015 13.8 11.5 - 14.5 % Final         . traZODone (DESYREL) 100 MG tablet [Pharmacy Med Name: TRAZODONE 100 MG TABLET] 90 tablet 0    Sig: TAKE 1 TABLET BY MOUTH EVERYDAY AT BEDTIME     Psychiatry: Antidepressants - Serotonin Modulator Passed - 09/21/2022  3:55 PM      Passed - Valid encounter within last 6 months    Recent Outpatient Visits          4 months ago Pruritic erythematous rash   [place], DO   4 months ago Type 2 diabetes mellitus with other specified complication, without long-term current use of insulin [facility])   [facility] [name], DO   11 months  ago Annual physical exam   [place], DO   1 year ago Chest wall pain   [facility] [place], [name], DO   1 year ago Type 2 diabetes mellitus with other specified complication, without long-term current use of insulin [facility])   [facility] [name], [name], DO      Future Appointments            In 1 month [name], [name], DO [facility], [name]   In 11 months [name], MD [place]

## 2022-09-24 ENCOUNTER — Inpatient Hospital Stay: Payer: Medicare Other

## 2022-09-24 ENCOUNTER — Inpatient Hospital Stay: Payer: Medicare Other | Attending: Oncology

## 2022-09-24 ENCOUNTER — Encounter: Payer: Self-pay | Admitting: Oncology

## 2022-09-24 ENCOUNTER — Inpatient Hospital Stay (HOSPITAL_BASED_OUTPATIENT_CLINIC_OR_DEPARTMENT_OTHER): Payer: Medicare Other | Admitting: Oncology

## 2022-09-24 VITALS — BP 128/79 | HR 101 | Temp 98.5°F | Resp 16 | Ht 64.0 in | Wt 185.0 lb

## 2022-09-24 DIAGNOSIS — Z87891 Personal history of nicotine dependence: Secondary | ICD-10-CM | POA: Diagnosis not present

## 2022-09-24 DIAGNOSIS — M8580 Other specified disorders of bone density and structure, unspecified site: Secondary | ICD-10-CM | POA: Diagnosis not present

## 2022-09-24 DIAGNOSIS — Z79811 Long term (current) use of aromatase inhibitors: Secondary | ICD-10-CM | POA: Diagnosis not present

## 2022-09-24 DIAGNOSIS — Z5111 Encounter for antineoplastic chemotherapy: Secondary | ICD-10-CM | POA: Insufficient documentation

## 2022-09-24 DIAGNOSIS — C50919 Malignant neoplasm of unspecified site of unspecified female breast: Secondary | ICD-10-CM

## 2022-09-24 DIAGNOSIS — C50912 Malignant neoplasm of unspecified site of left female breast: Secondary | ICD-10-CM | POA: Insufficient documentation

## 2022-09-24 DIAGNOSIS — Z9013 Acquired absence of bilateral breasts and nipples: Secondary | ICD-10-CM | POA: Insufficient documentation

## 2022-09-24 DIAGNOSIS — C50911 Malignant neoplasm of unspecified site of right female breast: Secondary | ICD-10-CM | POA: Diagnosis not present

## 2022-09-24 DIAGNOSIS — Z170 Estrogen receptor positive status [ER+]: Secondary | ICD-10-CM | POA: Insufficient documentation

## 2022-09-24 LAB — CBC WITH DIFFERENTIAL/PLATELET
Abs Immature Granulocytes: 0.02 10*3/uL (ref 0.00–0.07)
Basophils Absolute: 0.1 10*3/uL (ref 0.0–0.1)
Basophils Relative: 3 %
Eosinophils Absolute: 0.1 10*3/uL (ref 0.0–0.5)
Eosinophils Relative: 2 %
HCT: 27.9 % — ABNORMAL LOW (ref 36.0–46.0)
Hemoglobin: 9.3 g/dL — ABNORMAL LOW (ref 12.0–15.0)
Immature Granulocytes: 0 %
Lymphocytes Relative: 31 %
Lymphs Abs: 1.4 10*3/uL (ref 0.7–4.0)
MCH: 36.8 pg — ABNORMAL HIGH (ref 26.0–34.0)
MCHC: 33.3 g/dL (ref 30.0–36.0)
MCV: 110.3 fL — ABNORMAL HIGH (ref 80.0–100.0)
Monocytes Absolute: 0.7 10*3/uL (ref 0.1–1.0)
Monocytes Relative: 15 %
Neutro Abs: 2.3 10*3/uL (ref 1.7–7.7)
Neutrophils Relative %: 49 %
Platelets: 192 10*3/uL (ref 150–400)
RBC: 2.53 MIL/uL — ABNORMAL LOW (ref 3.87–5.11)
RDW: 15.3 % (ref 11.5–15.5)
WBC: 4.6 10*3/uL (ref 4.0–10.5)
nRBC: 0 % (ref 0.0–0.2)

## 2022-09-24 LAB — COMPREHENSIVE METABOLIC PANEL
ALT: 18 U/L (ref 0–44)
AST: 29 U/L (ref 15–41)
Albumin: 4.1 g/dL (ref 3.5–5.0)
Alkaline Phosphatase: 70 U/L (ref 38–126)
Anion gap: 9 (ref 5–15)
BUN: 21 mg/dL (ref 8–23)
CO2: 25 mmol/L (ref 22–32)
Calcium: 9.4 mg/dL (ref 8.9–10.3)
Chloride: 108 mmol/L (ref 98–111)
Creatinine, Ser: 1.56 mg/dL — ABNORMAL HIGH (ref 0.44–1.00)
GFR, Estimated: 36 mL/min — ABNORMAL LOW (ref >60)
Glucose, Bld: 122 mg/dL — ABNORMAL HIGH (ref 70–99)
Potassium: 4.2 mmol/L (ref 3.5–5.1)
Sodium: 142 mmol/L (ref 135–145)
Total Bilirubin: 0.4 mg/dL (ref 0.3–1.2)
Total Protein: 7.6 g/dL (ref 6.5–8.1)

## 2022-09-24 LAB — MAGNESIUM: Magnesium: 1.8 mg/dL (ref 1.7–2.4)

## 2022-09-24 MED ORDER — FULVESTRANT 250 MG/5ML IM SOSY
500.0000 mg | PREFILLED_SYRINGE | Freq: Once | INTRAMUSCULAR | Status: AC
  Administered 2022-09-24: 500 mg via INTRAMUSCULAR
  Filled 2022-09-24: qty 10

## 2022-09-24 NOTE — Progress Notes (Signed)
[place]  [phone]  Fax:([phone]) [place] DOB: 1951/02/08  MR#: [id number]  NTZ#:[id number]  Patient Care Team: [name], DO as PCP - General (Family Medicine) Pieter Partridge, DO as Consulting Physician (Neurology) Lloyd Huger, MD as Consulting Physician (Oncology)  CHIEF COMPLAINT: Recurrent, metastatic ER/PR, HER2 negative invasive carcinoma of the breast.    INTERVAL HISTORY: Patient returns to clinic today for further evaluation and continuation of Kisqali and fulvestrant.  She currently feels well and is asymptomatic.  She is tolerating her treatments well without significant side effects.  She has no neurologic complaints.  She denies any recent fevers or illnesses. She has a good appetite and denies weight loss.  She has no chest pain, shortness of breath, cough, or hemoptysis.  She denies any nausea, vomiting, or constipation. She has no urinary complaints.  Patient offers no specific complaints today.  REVIEW OF SYSTEMS:   Review of Systems  Constitutional: Negative.  Negative for fever, malaise/fatigue and weight loss.  Respiratory: Negative.  Negative for cough, hemoptysis and shortness of breath.   Cardiovascular: Negative.  Negative for chest pain and leg swelling.  Gastrointestinal: Negative.  Negative for abdominal pain.  Genitourinary: Negative.  Negative for dysuria.  Musculoskeletal: Negative.  Negative for joint pain and myalgias.  Skin: Negative.  Negative for rash.  Neurological: Negative.  Negative for dizziness, sensory change, focal weakness, weakness and headaches.  Psychiatric/Behavioral: Negative.  The patient is not nervous/anxious.     As per HPI. Otherwise, a complete review of systems is negative.  ONCOLOGY HISTORY: Oncology History Overview Note  1. Bilateral carcinoma of breast, February, 2015. Right breast status post radical mastectomy. T1 C. N0M0 (isolated tumor cells in one  lymph node) multifocal invasive cancer. Stage IC, Left breast, Status post radical mastectomy, T2, N1, M0 tumor. Stage II, RIght breast. Both tumors are estrogen receptor positive.  Progesterone receptor positive.  HER-2 receptor negative by FISH. Negative for BRCA mutation.  2. Started on Adriamycin and Cytoxan on March 01, 2014. Last cycle of Cytoxan and Adriamycin on May 01, 2014. Patient has finished last chemotherapy on September 9. (12 th dose was omitted because of neuropathy) 3.  Taking tamoxifen.  Patient had a poor tolerance to letrozole.(October, 2015) 4.  Patient is taking letrozole since November of 2015 5.  May, 2016 Letrozole has been put on hold because of bony pains 6.  Started on Aromasin from July of 2016    History of bilateral breast cancer (Resolved)  11/29/2013 Initial Diagnosis   Breast cancer bilateral, multifocal ER/PR pos, Her 2 neg,.     PAST MEDICAL HISTORY: Past Medical History:  Diagnosis Date   Anemia    Arthritis not really diagnosis but have pain   B12 deficiency 07/23/2016   Will check levels to determine if injections are needed again. Await results. Recent CBC at Onc WNL   Blood transfusion without reported diagnosis 1983 had a miscarriage/dnc   Cancer ([place]) 12/30/2013   Multifocal disease: pT1c,m, N0(isolated tumor cells) Er/ PR positive, Her 2 negative.   Cancer ([place]) 01/09/2014   Invasive lobular carcinoma, 2.2 cm, T2,N1 ER/PR positive, HER-2/neu negative   Cataract 2014?   Centrilobular emphysema ([place]) 67/59/1638   Complication of anesthesia 03/06/2022   Prolonged metabolism of Rocuronium during case.   Diabetes mellitus without complication ([place])    Diffuse cystic mastopathy    Erosive esophagitis    Essential hypertension, benign 09/24/2017  Family history of malignant neoplasm of gastrointestinal tract 2012   Fractured elbow 08/28/2014   Gastroesophageal reflux disease    Hx of metabolic acidosis with increased anion gap     Increased heart rate    Multiple sclerosis (HCC)    Neuromuscular disorder (HCC)    neuropathy in bil feet - left is worse.   Obesity, unspecified    Peripheral neuropathy    Personal history of tobacco use, presenting hazards to health    Restless leg syndrome    Sleep apnea    uses CPAP   Special screening for malignant neoplasms, colon    Squamous cell carcinoma of skin 08/24/2022   SCCIS - scalp, ED&C    PAST SURGICAL HISTORY: Past Surgical History:  Procedure Laterality Date   BREAST BIOPSY Right 1973,2001   BREAST BIOPSY Right 11/07/2013   INVASIVE MAMMARY CARCINOMA , ER/PR positive Her2 negative   BREAST BIOPSY Left 11/27/2013   invasive lobular and DCIS   BREAST SURGERY Bilateral 01/09/2014   mastectomy   cataract surgery Left 08/13/2022   CHOLECYSTECTOMY     COLONOSCOPY  2010   Dr. Jamal Collin   COLONOSCOPY WITH PROPOFOL N/A 06/11/2015   Procedure: COLONOSCOPY WITH PROPOFOL;  Surgeon: Christene Lye, MD;  Location: [facility] ENDOSCOPY;  Service: Endoscopy;  Laterality: N/A;   COLONOSCOPY WITH PROPOFOL N/A 12/23/2021   Procedure: COLONOSCOPY WITH PROPOFOL;  Surgeon: Lin Landsman, MD;  Location: [facility] ENDOSCOPY;  Service: Gastroenterology;  Laterality: N/A;   DILATATION & CURETTAGE/HYSTEROSCOPY WITH MYOSURE N/A 07/12/2018   Procedure: DILATATION & CURETTAGE/HYSTEROSCOPY WITH MYOSURE WITH ENDOMETRIAL POLYPECTOMY;  Surgeon: Will Bonnet, MD;  Location: [facility] ORS;  Service: Gynecology;  Laterality: N/A;   DILATION AND CURETTAGE OF UTERUS  1983   ESOPHAGOGASTRODUODENOSCOPY N/A 12/23/2021   Procedure: ESOPHAGOGASTRODUODENOSCOPY (EGD);  Surgeon: Lin Landsman, MD;  Location: [facility] ENDOSCOPY;  Service: Gastroenterology;  Laterality: N/A;   JOINT REPLACEMENT  11/23/2015   KNEE ARTHROPLASTY Right 03/06/2022   Procedure: COMPUTER ASSISTED TOTAL KNEE ARTHROPLASTY;  Surgeon: Dereck Leep, MD;  Location: [facility] ORS;  Service: Orthopedics;  Laterality: Right;    POLYPECTOMY  1989   PORTA CATH REMOVAL     PORTACATH PLACEMENT  2015   SKIN BIOPSY     on scalp   SPINE SURGERY  09/14/2014   Ruptured disk L4- L5   TOTAL KNEE ARTHROPLASTY Left 11/22/2015   Procedure: TOTAL KNEE ARTHROPLASTY;  Surgeon: Earlie Server, MD;  Location: [facility];  Service: Orthopedics;  Laterality: Left;   TUBAL LIGATION     WISDOM TOOTH EXTRACTION  2006    FAMILY HISTORY Family History  Problem Relation Age of Onset   Cancer Mother        lung   COPD Mother    Cancer Father        colon, stomach   Diabetes Maternal Grandmother    Hypertension Sister    Rectal cancer Paternal Uncle    Rectal cancer Paternal Uncle     GYNECOLOGIC HISTORY:  Patient's last menstrual period was 12/22/1999 (approximate).     ADVANCED DIRECTIVES:    HEALTH MAINTENANCE: Social History   Tobacco Use   Smoking status: Former    Packs/day: 1.00    Years: 30.00    Total pack years: 30.00    Types: Cigarettes    Quit date: 12/11/2013    Years since quitting: 8.7   Smokeless tobacco: Former  Scientific laboratory technician Use: Never used  Substance Use Topics  Alcohol use: Not Currently   Drug use: No     Colonoscopy:  PAP:  Bone density:  Mammogram:  No Known Allergies  Current Outpatient Medications  Medication Sig Dispense Refill   baclofen (LIORESAL) 10 MG tablet Take 1 tablet (10 mg total) by mouth 3 (three) times daily as needed for muscle spasms. 1080 tablet 0   calcium carbonate (OSCAL) 1500 (600 Ca) MG TABS tablet Take by mouth 2 (two) times daily with a meal.     famotidine (PEPCID) 20 MG tablet TAKE 1 TABLET BY MOUTH TWICE A DAY BEFORE MEALS 180 tablet 1   fluticasone (FLONASE) 50 MCG/ACT nasal spray PLACE 2 SPRAYS INTO BOTH NOSTRILS AT BEDTIME. 48 g 1   fulvestrant (FASLODEX) 250 MG/5ML injection Inject 500 mg into the muscle every 30 (thirty) days. One injection each buttock over 1-2 minutes. Warm prior to use.     Krill Oil 1000 MG CAPS Take 1,000 mg by mouth daily.      lisinopril (ZESTRIL) 5 MG tablet TAKE 1 TABLET BY MOUTH EVERY DAY IN THE EVENING 90 tablet 3   metFORMIN (GLUCOPHAGE) 500 MG tablet TAKE 1 TABLET BY MOUTH 2 TIMES DAILY WITH A MEAL. 180 tablet 2   mometasone (ELOCON) 0.1 % cream Apply to affected skin qd-bid prn flares 45 g 2   omeprazole (PRILOSEC) 40 MG capsule TAKE 1 CAPSULE (40 MG TOTAL) BY MOUTH 2 (TWO) TIMES DAILY BEFORE A MEAL. 180 capsule 1   prochlorperazine (COMPAZINE) 10 MG tablet Take 1 tablet (10 mg total) by mouth every 6 (six) hours as needed for nausea or vomiting. 20 tablet 1   ribociclib succ (KISQALI, 600 MG DOSE,) 200 MG Therapy Pack Take 3 tablets (600 mg total) by mouth daily. Take for 21 days on, 7 days off, repeat every 28 days. 63 tablet 5   traZODone (DESYREL) 50 MG tablet TAKE 1 TABLET BY MOUTH AT BEDTIME AS NEEDED FOR SLEEP 90 tablet 0   venlafaxine XR (EFFEXOR-XR) 75 MG 24 hr capsule Take 1 capsule (75 mg total) by mouth daily with breakfast. 360 capsule 0   vitamin B-12 (CYANOCOBALAMIN) 1000 MCG tablet Take 2,500 mcg by mouth every Monday, Wednesday, and Friday.     Vitamin D, Cholecalciferol, 50 MCG (2000 UT) CAPS Take 2,000 Units by mouth every Monday, Wednesday, and Friday.     acetaminophen (TYLENOL) 500 MG tablet Take 1,000 mg by mouth 2 (two) times daily as needed for moderate pain or headache. (Patient not taking: Reported on 07/30/2022)     diclofenac Sodium (VOLTAREN) 1 % GEL Apply 2 g topically 3 (three) times daily as needed. (Patient not taking: Reported on 07/30/2022) 100 g 2   loperamide (IMODIUM A-D) 2 MG tablet Take 2 tablets (4 mg total) by mouth 4 (four) times daily as needed for diarrhea or loose stools. (Patient not taking: Reported on 09/24/2022) 30 tablet 0   sodium chloride (OCEAN) 0.65 % SOLN nasal spray Place 1 spray into both nostrils as needed for congestion. (Patient not taking: Reported on 07/30/2022)     No current facility-administered medications for this visit.   Facility-Administered  Medications Ordered in Other Visits  Medication Dose Route Frequency Provider Last Rate Last Admin   fulvestrant (FASLODEX) injection 500 mg  500 mg Intramuscular Once Lloyd Huger, MD        OBJECTIVE: BP 128/79 (BP Location: Left Arm, Patient Position: Sitting, Cuff Size: Normal)   Pulse (!) 101   Temp 98.5 F (36.9  C) (Oral)   Resp 16   Ht _0  (1.626 m)   Wt 185 lb (83.9 kg)   LMP 12/22/1999 (Approximate)   SpO2 100%   BMI 31.76 kg/m    Body mass index is 31.76 kg/m.    ECOG FS:0 - Asymptomatic  General: Well-developed, well-nourished, no acute distress. Eyes: Pink conjunctiva, anicteric sclera. HEENT: Normocephalic, moist mucous membranes. Lungs: No audible wheezing or coughing. Heart: Regular rate and rhythm. Abdomen: Soft, nontender, no obvious distention. Musculoskeletal: No edema, cyanosis, or clubbing. Neuro: Alert, answering all questions appropriately. Cranial nerves grossly intact. Skin: No rashes or petechiae noted. Psych: Normal affect.  LAB RESULTS:  Appointment on 09/24/2022  Component Date Value Ref Range Status   WBC 09/24/2022 4.6  4.0 - 10.5 K/uL Final   RBC 09/24/2022 2.53 (L)  3.87 - 5.11 MIL/uL Final   Hemoglobin 09/24/2022 9.3 (L)  12.0 - 15.0 g/dL Final   HCT 09/24/2022 27.9 (L)  36.0 - 46.0 % Final   MCV 09/24/2022 110.3 (H)  80.0 - 100.0 fL Final   MCH 09/24/2022 36.8 (H)  26.0 - 34.0 pg Final   MCHC 09/24/2022 33.3  30.0 - 36.0 g/dL Final   RDW 09/24/2022 15.3  11.5 - 15.5 % Final   Platelets 09/24/2022 192  150 - 400 K/uL Final   nRBC 09/24/2022 0.0  0.0 - 0.2 % Final   Neutrophils Relative % 09/24/2022 49  % Final   Neutro Abs 09/24/2022 2.3  1.7 - 7.7 K/uL Final   Lymphocytes Relative 09/24/2022 31  % Final   Lymphs Abs 09/24/2022 1.4  0.7 - 4.0 K/uL Final   Monocytes Relative 09/24/2022 15  % Final   Monocytes Absolute 09/24/2022 0.7  0.1 - 1.0 K/uL Final   Eosinophils Relative 09/24/2022 2  % Final   Eosinophils Absolute  09/24/2022 0.1  0.0 - 0.5 K/uL Final   Basophils Relative 09/24/2022 3  % Final   Basophils Absolute 09/24/2022 0.1  0.0 - 0.1 K/uL Final   Immature Granulocytes 09/24/2022 0  % Final   Abs Immature Granulocytes 09/24/2022 0.02  0.00 - 0.07 K/uL Final   Performed at [facility], [place], [place] [address]   Sodium 09/24/2022 142  135 - 145 mmol/L Final   Potassium 09/24/2022 4.2  3.5 - 5.1 mmol/L Final   Chloride 09/24/2022 108  98 - 111 mmol/L Final   CO2 09/24/2022 25  22 - 32 mmol/L Final   Glucose, Bld 09/24/2022 122 (H)  70 - 99 mg/dL Final   Glucose reference range applies only to samples taken after fasting for at least 8 hours.   BUN 09/24/2022 21  8 - 23 mg/dL Final   Creatinine, Ser 09/24/2022 1.56 (H)  0.44 - 1.00 mg/dL Final   Calcium 09/24/2022 9.4  8.9 - 10.3 mg/dL Final   Total Protein 09/24/2022 7.6  6.5 - 8.1 g/dL Final   Albumin 09/24/2022 4.1  3.5 - 5.0 g/dL Final   AST 09/24/2022 29  15 - 41 U/L Final   ALT 09/24/2022 18  0 - 44 U/L Final   Alkaline Phosphatase 09/24/2022 70  38 - 126 U/L Final   Total Bilirubin 09/24/2022 0.4  0.3 - 1.2 mg/dL Final   GFR, Estimated 09/24/2022 36 (L)  >60 mL/min Final   Comment: (NOTE) Calculated using the CKD-EPI Creatinine Equation (2021)    Anion gap 09/24/2022 9  5 - 15 Final   Performed at [facility],  [place], [place] [address]   Magnesium 09/24/2022 1.8  1.7 - 2.4 mg/dL Final   Performed at [facility], [address] [place], [place] [address]    STUDIES: No results found.  ONCOLOGY HISTORY:  Bilateral adenocarcinoma of the breast. Patient is status post bilateral mastectomy in February 2015. She also received adjuvant chemotherapy with Adriamycin, Cytoxan, and Taxol completing treatment in approximately October 2015. She then initiated letrozole, but could not tolerate secondary to leg pain and was switched to Aromasin.  Patient then switched to tamoxifen in  October 2019 secondary to cost.  Patient subsequently discontinued tamoxifen and did not complete the recommended 5 years of treatment.   ASSESSMENT: Recurrent, metastatic ER/PR, HER2 negative invasive carcinoma of the breast.   PLAN:    1. Recurrent, metastatic ER/PR, HER2 negative invasive carcinoma of the breast: See oncology history as above.  Biopsy confirmed recurrent disease.  PET scan results from December 26, 2021 reviewed independently with hypermetabolic left supraclavicular, subpectoral, and axillary lymphadenopathy consistent with nodal recurrence of patient's history of breast cancer.  No distant metastases were identified.  Repeat PET scan on April 23, 2022 reviewed independently with significant improvement of patient's disease burden.  Her initial CA 27-29 was reported 260.1, decreased to 86.7, but now has bumped slightly to 90.7.  Today's result is pending. Continue Kisqali 600 mg daily for 21 days with 7 days off.  Proceed with fulvestrant today.  Return to clinic in 4 weeks for further evaluation and continuation of treatment.  2.  Osteopenia: Patient's last bone mineral density was on August 01, 2016 and revealed a T-score of -1.2. Continue monitoring bone mineral density by PCP. 3.  Multiple sclerosis: Chronic.  Continue evaluation and treatment per neurology.  4.  Esophageal discomfort/mild dysphagia: Patient does not complain of this today.  Recent colonoscopy and EGD did not reveal any significant pathology. 5.  Renal insufficiency: Chronic and unchanged.  Patient's GFR stable at 36.  Kisqali needs to be dose reduced if GFR falls below 30. 6.  Anemia: Hemoglobin mildly improved to 9.3.  Continue to monitor closely.  Consider transfusion if falls below 8.0.   7.  Diarrhea: Patient admits to loose stools, but not diarrhea.  She takes in the a.m. and frequently.   8.  Hypomagnesia: Resolved. 9.  Hypokalemia: Resolved.  Patient expressed understanding and was in agreement with  this plan. She also understands that She can call clinic at any time with any questions, concerns, or complaints.   Breast cancer bilateral, multifocal ER/PR pos, Her 2 neg,.   Staging form: Breast, AJCC 7th Edition     Clinical: Stage IIB (T2, N1, M0) - Signed by Forest Gleason, MD on 04/22/2015   Lloyd Huger, MD   09/24/2022 10:50 AM

## 2022-09-26 LAB — CANCER ANTIGEN 27.29: CA 27.29: 88.9 U/mL — ABNORMAL HIGH (ref 0.0–38.6)

## 2022-09-28 ENCOUNTER — Ambulatory Visit: Payer: Medicare Other

## 2022-09-28 ENCOUNTER — Ambulatory Visit: Payer: Medicare Other | Admitting: Pharmacist

## 2022-09-28 ENCOUNTER — Other Ambulatory Visit: Payer: Medicare Other

## 2022-09-30 ENCOUNTER — Other Ambulatory Visit (HOSPITAL_COMMUNITY): Payer: Self-pay

## 2022-10-01 ENCOUNTER — Ambulatory Visit: Payer: Medicare Other

## 2022-10-01 ENCOUNTER — Other Ambulatory Visit: Payer: Medicare Other

## 2022-10-01 ENCOUNTER — Ambulatory Visit: Payer: Medicare Other | Admitting: Pharmacist

## 2022-10-01 ENCOUNTER — Telehealth: Payer: Self-pay

## 2022-10-01 NOTE — Telephone Encounter (Signed)
Oral Oncology Patient Advocate Encounter   Received notification that patient is due for re-enrollment for assistance for Kisqali through [employer].   Re-enrollment process has been initiated and will be submitted upon completion of necessary documents.  [employer]' phone number [phone].   I will continue to follow until final determination.  Berdine Addison, [facility] [place]  [phone] (phone) [phone] (fax) 10/01/2022 3:07 PM

## 2022-10-20 ENCOUNTER — Other Ambulatory Visit: Payer: Self-pay

## 2022-10-20 DIAGNOSIS — I700 Atherosclerosis of aorta: Secondary | ICD-10-CM

## 2022-10-20 DIAGNOSIS — Z0000 Encounter for general adult medical examination without abnormal findings: Secondary | ICD-10-CM

## 2022-10-20 DIAGNOSIS — E1169 Type 2 diabetes mellitus with other specified complication: Secondary | ICD-10-CM

## 2022-10-21 ENCOUNTER — Other Ambulatory Visit: Payer: Medicare Other

## 2022-10-21 DIAGNOSIS — E1169 Type 2 diabetes mellitus with other specified complication: Secondary | ICD-10-CM | POA: Diagnosis not present

## 2022-10-21 DIAGNOSIS — E785 Hyperlipidemia, unspecified: Secondary | ICD-10-CM | POA: Diagnosis not present

## 2022-10-21 DIAGNOSIS — Z0000 Encounter for general adult medical examination without abnormal findings: Secondary | ICD-10-CM | POA: Diagnosis not present

## 2022-10-21 DIAGNOSIS — I700 Atherosclerosis of aorta: Secondary | ICD-10-CM | POA: Diagnosis not present

## 2022-10-21 NOTE — Progress Notes (Signed)
[place]  [phone] [phone]  Patient Care Team: [name], DO as PCP - General (Family Medicine) Pieter Partridge, DO as Consulting Physician (Neurology) Lloyd Huger, MD as Consulting Physician (Oncology)   Name of the patient: Cassidy Norris  [id number]  November 09, 1951   Date of visit: 10/21/22  HPI: Patient is a 71 y.o. female with recurrent breast cancer, ER/PR positive/HER2 negative. Currently treating with Kisqali (ribociclib) and fulvestrant. She started ribociclib on 01/08/22.  Reason for Consult: Oral chemotherapy follow-up for ribociclib therapy.   PAST MEDICAL HISTORY: Past Medical History:  Diagnosis Date   Anemia    Arthritis not really diagnosis but have pain   B12 deficiency 07/23/2016   Will check levels to determine if injections are needed again. Await results. Recent CBC at [facility]   Blood transfusion without reported diagnosis 1983 had a miscarriage/dnc   Cancer ([place]) 12/30/2013   Multifocal disease: pT1c,m, N0(isolated tumor cells) Er/ PR positive, Her 2 negative.   Cancer ([facility]) 01/09/2014   Invasive lobular carcinoma, 2.2 cm, T2,N1 ER/PR positive, HER-2/neu negative   Cataract 2014?   Centrilobular emphysema ([place]) 90/24/0973   Complication of anesthesia 03/06/2022   Prolonged metabolism of Rocuronium during case.   Diabetes mellitus without complication ([place])    Diffuse cystic mastopathy    Erosive esophagitis    Essential hypertension, benign 09/24/2017   Family history of malignant neoplasm of gastrointestinal tract 2012   Fractured elbow 08/28/2014   Gastroesophageal reflux disease    Hx of metabolic acidosis with increased anion gap    Increased heart rate    Multiple sclerosis (HCC)    Neuromuscular disorder (HCC)    neuropathy in bil feet - left is worse.   Obesity, unspecified    Peripheral neuropathy    Personal history of tobacco use, presenting hazards  to health    Restless leg syndrome    Sleep apnea    uses CPAP   Special screening for malignant neoplasms, colon    Squamous cell carcinoma of skin 08/24/2022   SCCIS - scalp, ED&C    HEMATOLOGY/ONCOLOGY HISTORY:  Oncology History Overview Note  1. Bilateral carcinoma of breast, February, 2015. Right breast status post radical mastectomy. T1 C. N0M0 (isolated tumor cells in one lymph node) multifocal invasive cancer. Stage IC, Left breast, Status post radical mastectomy, T2, N1, M0 tumor. Stage II, RIght breast. Both tumors are estrogen receptor positive.  Progesterone receptor positive.  HER-2 receptor negative by FISH. Negative for BRCA mutation.  2. Started on Adriamycin and Cytoxan on March 01, 2014. Last cycle of Cytoxan and Adriamycin on May 01, 2014. Patient has finished last chemotherapy on September 9. (12 th dose was omitted because of neuropathy) 3.  Taking tamoxifen.  Patient had a poor tolerance to letrozole.(October, 2015) 4.  Patient is taking letrozole since November of 2015 5.  May, 2016 Letrozole has been put on hold because of bony pains 6.  Started on Aromasin from July of 2016    History of bilateral breast cancer (Resolved)  11/29/2013 Initial Diagnosis   Breast cancer bilateral, multifocal ER/PR pos, Her 2 neg,.     ALLERGIES:  has No Known Allergies.  MEDICATIONS:  Current Outpatient Medications  Medication Sig Dispense Refill   acetaminophen (TYLENOL) 500 MG tablet Take 1,000 mg by mouth 2 (two) times daily as needed for moderate pain or headache. (Patient not taking: Reported on 07/30/2022)     baclofen (LIORESAL) 10  MG tablet Take 1 tablet (10 mg total) by mouth 3 (three) times daily as needed for muscle spasms. 1080 tablet 0   calcium carbonate (OSCAL) 1500 (600 Ca) MG TABS tablet Take by mouth 2 (two) times daily with a meal.     diclofenac Sodium (VOLTAREN) 1 % GEL Apply 2 g topically 3 (three) times daily as needed. (Patient not taking: Reported on  07/30/2022) 100 g 2   famotidine (PEPCID) 20 MG tablet TAKE 1 TABLET BY MOUTH TWICE A DAY BEFORE MEALS 180 tablet 1   fluticasone (FLONASE) 50 MCG/ACT nasal spray PLACE 2 SPRAYS INTO BOTH NOSTRILS AT BEDTIME. 48 g 1   fulvestrant (FASLODEX) 250 MG/5ML injection Inject 500 mg into the muscle every 30 (thirty) days. One injection each buttock over 1-2 minutes. Warm prior to use.     Krill Oil 1000 MG CAPS Take 1,000 mg by mouth daily.     lisinopril (ZESTRIL) 5 MG tablet TAKE 1 TABLET BY MOUTH EVERY DAY IN THE EVENING 90 tablet 3   loperamide (IMODIUM A-D) 2 MG tablet Take 2 tablets (4 mg total) by mouth 4 (four) times daily as needed for diarrhea or loose stools. (Patient not taking: Reported on 09/24/2022) 30 tablet 0   metFORMIN (GLUCOPHAGE) 500 MG tablet TAKE 1 TABLET BY MOUTH 2 TIMES DAILY WITH A MEAL. 180 tablet 2   mometasone (ELOCON) 0.1 % cream Apply to affected skin qd-bid prn flares 45 g 2   omeprazole (PRILOSEC) 40 MG capsule TAKE 1 CAPSULE (40 MG TOTAL) BY MOUTH 2 (TWO) TIMES DAILY BEFORE A MEAL. 180 capsule 1   prochlorperazine (COMPAZINE) 10 MG tablet Take 1 tablet (10 mg total) by mouth every 6 (six) hours as needed for nausea or vomiting. 20 tablet 1   ribociclib succ (KISQALI, 600 MG DOSE,) 200 MG Therapy Pack Take 3 tablets (600 mg total) by mouth daily. Take for 21 days on, 7 days off, repeat every 28 days. 63 tablet 5   sodium chloride (OCEAN) 0.65 % SOLN nasal spray Place 1 spray into both nostrils as needed for congestion. (Patient not taking: Reported on 07/30/2022)     traZODone (DESYREL) 50 MG tablet TAKE 1 TABLET BY MOUTH AT BEDTIME AS NEEDED FOR SLEEP 90 tablet 0   venlafaxine XR (EFFEXOR-XR) 75 MG 24 hr capsule Take 1 capsule (75 mg total) by mouth daily with breakfast. 360 capsule 0   vitamin B-12 (CYANOCOBALAMIN) 1000 MCG tablet Take 2,500 mcg by mouth every Monday, Wednesday, and Friday.     Vitamin D, Cholecalciferol, 50 MCG (2000 UT) CAPS Take 2,000 Units by mouth every  Monday, Wednesday, and Friday.     No current facility-administered medications for this visit.    VITAL SIGNS: LMP 12/22/1999 (Approximate)  There were no vitals filed for this visit.   Estimated body mass index is 31.76 kg/m as calculated from the following:   Height as of 09/24/22: _0  (1.626 m).   Weight as of 09/24/22: 83.9 kg (185 lb).  LABS: CBC:    Component Value Date/Time   WBC 4.6 09/24/2022 1002   HGB 9.3 (L) 09/24/2022 1002   HGB 15.5 07/13/2017 0937   HCT 27.9 (L) 09/24/2022 1002   HCT 46.7 (H) 07/13/2017 0937   PLT 192 09/24/2022 1002   PLT 211 07/13/2017 0937   MCV 110.3 (H) 09/24/2022 1002   MCV 94 07/13/2017 0937   MCV 93 03/11/2015 1356   NEUTROABS 2.3 09/24/2022 1002   NEUTROABS 5.4 07/13/2017  1165   NEUTROABS 5.6 03/11/2015 1356   LYMPHSABS 1.4 09/24/2022 1002   LYMPHSABS 2.1 07/13/2017 0937   LYMPHSABS 2.3 03/11/2015 1356   MONOABS 0.7 09/24/2022 1002   MONOABS 1.0 (H) 03/11/2015 1356   EOSABS 0.1 09/24/2022 1002   EOSABS 0.2 07/13/2017 0937   EOSABS 0.3 03/11/2015 1356   BASOSABS 0.1 09/24/2022 1002   BASOSABS 0.0 07/13/2017 0937   BASOSABS 0.1 03/11/2015 1356   Comprehensive Metabolic Panel:    Component Value Date/Time   NA 142 09/24/2022 1002   NA 142 07/14/2019 1516   NA 139 03/11/2015 1356   K 4.2 09/24/2022 1002   K 3.4 (L) 03/11/2015 1356   CL 108 09/24/2022 1002   CL 103 03/11/2015 1356   CO2 25 09/24/2022 1002   CO2 28 03/11/2015 1356   BUN 21 09/24/2022 1002   BUN 12 07/14/2019 1516   BUN 13 03/11/2015 1356   CREATININE 1.56 (H) 09/24/2022 1002   CREATININE 0.85 10/20/2021 0838   GLUCOSE 122 (H) 09/24/2022 1002   GLUCOSE 118 (H) 03/11/2015 1356   CALCIUM 9.4 09/24/2022 1002   CALCIUM 9.2 03/11/2015 1356   AST 29 09/24/2022 1002   AST 28 03/11/2015 1356   ALT 18 09/24/2022 1002   ALT 32 03/11/2015 1356   ALKPHOS 70 09/24/2022 1002   ALKPHOS 68 03/11/2015 1356   BILITOT 0.4 09/24/2022 1002   BILITOT 0.4 07/14/2019  1516   BILITOT 0.7 03/11/2015 1356   PROT 7.6 09/24/2022 1002   PROT 6.9 07/14/2019 1516   PROT 6.9 03/11/2015 1356   ALBUMIN 4.1 09/24/2022 1002   ALBUMIN 4.5 07/14/2019 1516   ALBUMIN 4.2 03/11/2015 1356     Present during today's visit: patient only  Assessment and Plan: CBC and CMP assessed, continue ribociclib 600 mg 21 days on/7 days off.  Patient will received her fulvestrant today Mag checked today slightly low, reviewed with patient foods she can eat that are high in magnesium, will recheck at next visit   Oral Chemotherapy Side Effect/Intolerance:  No reported diarrhea, fatigue, or nausea/vomiting on ribociclib  Other:  Patient is scheduled for cataract surgery on 9/21 and 10/12  Oral Chemotherapy Adherence: no missed dose reported No patient barriers to medication adherence identified.   New medications: none reported  Medication Access Issues: no issues, patient fills at [facility]    Patient expressed understanding and was in agreement with this plan. She also understands that She can call clinic at any time with any questions, concerns, or complaints.   Follow-up plan: RTC in 4 weeks  Thank you for allowing me to participate in the care of this very pleasant patient.   Time Total: 15 min  Visit consisted of counseling and education on dealing with issues of symptom management in the setting of serious and potentially life-threatening illness.Greater than 50%  of this time was spent counseling and coordinating care related to the above assessment and plan.  Signed by: Darl Pikes, PharmD, BCPS, Salley Slaughter, CPP Hematology/Oncology Clinical Pharmacist Practitioner [place]/DB/AP Oral [facility] [phone]  10/21/2022 3:44 PM

## 2022-10-22 ENCOUNTER — Ambulatory Visit: Payer: Medicare Other | Admitting: Oncology

## 2022-10-22 ENCOUNTER — Inpatient Hospital Stay: Payer: Medicare Other | Admitting: Pharmacist

## 2022-10-22 ENCOUNTER — Inpatient Hospital Stay (HOSPITAL_BASED_OUTPATIENT_CLINIC_OR_DEPARTMENT_OTHER): Payer: Medicare Other | Admitting: Oncology

## 2022-10-22 ENCOUNTER — Inpatient Hospital Stay: Payer: Medicare Other

## 2022-10-22 ENCOUNTER — Other Ambulatory Visit: Payer: Medicare Other

## 2022-10-22 ENCOUNTER — Encounter: Payer: Self-pay | Admitting: Oncology

## 2022-10-22 DIAGNOSIS — C50919 Malignant neoplasm of unspecified site of unspecified female breast: Secondary | ICD-10-CM

## 2022-10-22 DIAGNOSIS — C50911 Malignant neoplasm of unspecified site of right female breast: Secondary | ICD-10-CM | POA: Diagnosis not present

## 2022-10-22 DIAGNOSIS — M8580 Other specified disorders of bone density and structure, unspecified site: Secondary | ICD-10-CM | POA: Diagnosis not present

## 2022-10-22 DIAGNOSIS — Z170 Estrogen receptor positive status [ER+]: Secondary | ICD-10-CM | POA: Diagnosis not present

## 2022-10-22 DIAGNOSIS — Z79811 Long term (current) use of aromatase inhibitors: Secondary | ICD-10-CM | POA: Diagnosis not present

## 2022-10-22 DIAGNOSIS — C50912 Malignant neoplasm of unspecified site of left female breast: Secondary | ICD-10-CM | POA: Diagnosis not present

## 2022-10-22 DIAGNOSIS — Z5111 Encounter for antineoplastic chemotherapy: Secondary | ICD-10-CM | POA: Diagnosis not present

## 2022-10-22 LAB — HEMOGLOBIN A1C
Hgb A1c MFr Bld: 5.8 % of total Hgb — ABNORMAL HIGH (ref <5.7)
Mean Plasma Glucose: 120 mg/dL
eAG (mmol/L): 6.6 mmol/L

## 2022-10-22 LAB — COMPREHENSIVE METABOLIC PANEL
ALT: 19 U/L (ref 0–44)
AST: 33 U/L (ref 15–41)
Albumin: 4 g/dL (ref 3.5–5.0)
Alkaline Phosphatase: 69 U/L (ref 38–126)
Anion gap: 9 (ref 5–15)
BUN: 20 mg/dL (ref 8–23)
CO2: 26 mmol/L (ref 22–32)
Calcium: 9.4 mg/dL (ref 8.9–10.3)
Chloride: 105 mmol/L (ref 98–111)
Creatinine, Ser: 1.53 mg/dL — ABNORMAL HIGH (ref 0.44–1.00)
GFR, Estimated: 36 mL/min — ABNORMAL LOW (ref >60)
Glucose, Bld: 147 mg/dL — ABNORMAL HIGH (ref 70–99)
Potassium: 3.8 mmol/L (ref 3.5–5.1)
Sodium: 140 mmol/L (ref 135–145)
Total Bilirubin: 0.3 mg/dL (ref 0.3–1.2)
Total Protein: 7.4 g/dL (ref 6.5–8.1)

## 2022-10-22 LAB — MAGNESIUM: Magnesium: 1.8 mg/dL (ref 1.7–2.4)

## 2022-10-22 LAB — CBC WITH DIFFERENTIAL/PLATELET
Abs Immature Granulocytes: 0.01 10*3/uL (ref 0.00–0.07)
Basophils Absolute: 0.1 10*3/uL (ref 0.0–0.1)
Basophils Relative: 2 %
Eosinophils Absolute: 0.1 10*3/uL (ref 0.0–0.5)
Eosinophils Relative: 2 %
HCT: 27.6 % — ABNORMAL LOW (ref 36.0–46.0)
Hemoglobin: 9.6 g/dL — ABNORMAL LOW (ref 12.0–15.0)
Immature Granulocytes: 0 %
Lymphocytes Relative: 32 %
Lymphs Abs: 1.4 10*3/uL (ref 0.7–4.0)
MCH: 37.5 pg — ABNORMAL HIGH (ref 26.0–34.0)
MCHC: 34.8 g/dL (ref 30.0–36.0)
MCV: 107.8 fL — ABNORMAL HIGH (ref 80.0–100.0)
Monocytes Absolute: 0.7 10*3/uL (ref 0.1–1.0)
Monocytes Relative: 15 %
Neutro Abs: 2.2 10*3/uL (ref 1.7–7.7)
Neutrophils Relative %: 49 %
Platelets: 180 10*3/uL (ref 150–400)
RBC: 2.56 MIL/uL — ABNORMAL LOW (ref 3.87–5.11)
RDW: 14.2 % (ref 11.5–15.5)
WBC: 4.5 10*3/uL (ref 4.0–10.5)
nRBC: 0 % (ref 0.0–0.2)

## 2022-10-22 LAB — LIPID PANEL
Cholesterol: 194 mg/dL (ref <200)
HDL: 35 mg/dL — ABNORMAL LOW (ref >=50)
LDL Cholesterol (Calc): 118 mg/dL (calc) — ABNORMAL HIGH
Non-HDL Cholesterol (Calc): 159 mg/dL (calc) — ABNORMAL HIGH (ref <130)
Total CHOL/HDL Ratio: 5.5 (calc) — ABNORMAL HIGH (ref <5.0)
Triglycerides: 307 mg/dL — ABNORMAL HIGH (ref <150)

## 2022-10-22 LAB — TSH: TSH: 0.84 mIU/L (ref 0.40–4.50)

## 2022-10-22 MED ORDER — FULVESTRANT 250 MG/5ML IM SOSY
500.0000 mg | PREFILLED_SYRINGE | Freq: Once | INTRAMUSCULAR | Status: AC
  Administered 2022-10-22: 500 mg via INTRAMUSCULAR
  Filled 2022-10-22: qty 10

## 2022-10-22 NOTE — Progress Notes (Signed)
Pt has mild pain in right chest and shoulder area. Knee pain bilaterally. Denies hot flashes. Does have fatigue.

## 2022-10-22 NOTE — Progress Notes (Signed)
Cassidy Norris  [phone]) [phone]  Fax:([phone]) [place] DOB: [birth year]  MR#: [id number]  TOI#:[id number]  Patient Care Team: [name], DO as PCP - General (Family Medicine) Pieter Partridge, DO as Consulting Physician (Neurology) Lloyd Huger, MD as Consulting Physician (Oncology)  CHIEF COMPLAINT: Recurrent, metastatic ER/PR, HER2 negative invasive carcinoma of the breast.    INTERVAL HISTORY: Patient returns to clinic today for further evaluation and continuation of Kisqali and fulvestrant.  She continues to feel well and remains asymptomatic.  She is tolerating her treatments without significant side effects.  She has no neurologic complaints.  She denies any recent fevers or illnesses. She has a good appetite and denies weight loss.  She has no chest pain, shortness of breath, cough, or hemoptysis.  She denies any nausea, vomiting, or constipation. She has no urinary complaints.  Patient offers no specific complaints today.  REVIEW OF SYSTEMS:   Review of Systems  Constitutional: Negative.  Negative for fever, malaise/fatigue and weight loss.  Respiratory: Negative.  Negative for cough, hemoptysis and shortness of breath.   Cardiovascular: Negative.  Negative for chest pain and leg swelling.  Gastrointestinal: Negative.  Negative for abdominal pain.  Genitourinary: Negative.  Negative for dysuria.  Musculoskeletal: Negative.  Negative for joint pain and myalgias.  Skin: Negative.  Negative for rash.  Neurological: Negative.  Negative for dizziness, sensory change, focal weakness, weakness and headaches.  Psychiatric/Behavioral: Negative.  The patient is not nervous/anxious.     As per HPI. Otherwise, a complete review of systems is negative.  ONCOLOGY HISTORY: Oncology History Overview Note  1. Bilateral carcinoma of breast, February, 2015. Right breast status post radical mastectomy. T1 C. N0M0 (isolated tumor cells in  one lymph node) multifocal invasive cancer. Stage IC, Left breast, Status post radical mastectomy, T2, N1, M0 tumor. Stage II, RIght breast. Both tumors are estrogen receptor positive.  Progesterone receptor positive.  HER-2 receptor negative by FISH. Negative for BRCA mutation.  2. Started on Adriamycin and Cytoxan on March 01, 2014. Last cycle of Cytoxan and Adriamycin on May 01, 2014. Patient has finished last chemotherapy on September 9. (12 th dose was omitted because of neuropathy) 3.  Taking tamoxifen.  Patient had a poor tolerance to letrozole.(October, 2015) 4.  Patient is taking letrozole since November of 2015 5.  May, 2016 Letrozole has been put on hold because of bony pains 6.  Started on Aromasin from July of 2016    History of bilateral breast cancer (Resolved)  11/29/2013 Initial Diagnosis   Breast cancer bilateral, multifocal ER/PR pos, Her 2 neg,.     PAST MEDICAL HISTORY: Past Medical History:  Diagnosis Date   Anemia    Arthritis not really diagnosis but have pain   B12 deficiency 07/23/2016   Will check levels to determine if injections are needed again. Await results. Recent CBC at Onc WNL   Blood transfusion without reported diagnosis 1983 had a miscarriage/dnc   Cancer ([place]) 12/30/2013   Multifocal disease: pT1c,m, N0(isolated tumor cells) Er/ PR positive, Her 2 negative.   Cancer ([place]) 01/09/2014   Invasive lobular carcinoma, 2.2 cm, T2,N1 ER/PR positive, HER-2/neu negative   Cataract 2014?   Centrilobular emphysema ([place]) 83/38/2505   Complication of anesthesia 03/06/2022   Prolonged metabolism of Rocuronium during case.   Diabetes mellitus without complication ([place])    Diffuse cystic mastopathy    Erosive esophagitis    Essential hypertension, benign 09/24/2017  Family history of malignant neoplasm of gastrointestinal tract 2012   Fractured elbow 08/28/2014   Gastroesophageal reflux disease    Hx of metabolic acidosis with increased anion gap     Increased heart rate    Multiple sclerosis (HCC)    Neuromuscular disorder (HCC)    neuropathy in bil feet - left is worse.   Obesity, unspecified    Peripheral neuropathy    Personal history of tobacco use, presenting hazards to health    Restless leg syndrome    Sleep apnea    uses CPAP   Special screening for malignant neoplasms, colon    Squamous cell carcinoma of skin 08/24/2022   SCCIS - scalp, ED&C    PAST SURGICAL HISTORY: Past Surgical History:  Procedure Laterality Date   BREAST BIOPSY Right 1973,2001   BREAST BIOPSY Right 11/07/2013   INVASIVE MAMMARY CARCINOMA , ER/PR positive Her2 negative   BREAST BIOPSY Left 11/27/2013   invasive lobular and DCIS   BREAST SURGERY Bilateral 01/09/2014   mastectomy   cataract surgery Left 08/13/2022   CHOLECYSTECTOMY     COLONOSCOPY  2010   Dr. Jamal Collin   COLONOSCOPY WITH PROPOFOL N/A 06/11/2015   Procedure: COLONOSCOPY WITH PROPOFOL;  Surgeon: Christene Lye, MD;  Location: [facility] ENDOSCOPY;  Service: Endoscopy;  Laterality: N/A;   COLONOSCOPY WITH PROPOFOL N/A 12/23/2021   Procedure: COLONOSCOPY WITH PROPOFOL;  Surgeon: Lin Landsman, MD;  Location[facility] ENDOSCOPY;  Service: Gastroenterology;  Laterality: N/A;   DILATATION & CURETTAGE/HYSTEROSCOPY WITH MYOSURE N/A 07/12/2018   Procedure: DILATATION & CURETTAGE/HYSTEROSCOPY WITH MYOSURE WITH ENDOMETRIAL POLYPECTOMY;  Surgeon: Will Bonnet, MD;  Location: [facility] ORS;  Service: Gynecology;  Laterality: N/A;   DILATION AND CURETTAGE OF UTERUS  1983   ESOPHAGOGASTRODUODENOSCOPY N/A 12/23/2021   Procedure: ESOPHAGOGASTRODUODENOSCOPY (EGD);  Surgeon: Lin Landsman, MD;  Location: [facility] ENDOSCOPY;  Service: Gastroenterology;  Laterality: N/A;   JOINT REPLACEMENT  11/23/2015   KNEE ARTHROPLASTY Right 03/06/2022   Procedure: COMPUTER ASSISTED TOTAL KNEE ARTHROPLASTY;  Surgeon: Dereck Leep, MD;  Location: [facility] ORS;  Service: Orthopedics;  Laterality: Right;    POLYPECTOMY  1989   PORTA CATH REMOVAL     PORTACATH PLACEMENT  2015   SKIN BIOPSY     on scalp   SPINE SURGERY  09/14/2014   Ruptured disk L4- L5   TOTAL KNEE ARTHROPLASTY Left 11/22/2015   Procedure: TOTAL KNEE ARTHROPLASTY;  Surgeon: Earlie Server, MD;  Location: [place];  Service: Orthopedics;  Laterality: Left;   TUBAL LIGATION     WISDOM TOOTH EXTRACTION  2006    FAMILY HISTORY Family History  Problem Relation Age of Onset   Cancer Mother        lung   COPD Mother    Cancer Father        colon, stomach   Diabetes Maternal Grandmother    Hypertension Sister    Rectal cancer Paternal Uncle    Rectal cancer Paternal Uncle     GYNECOLOGIC HISTORY:  Patient's last menstrual period was 12/22/1999 (approximate).     ADVANCED DIRECTIVES:    HEALTH MAINTENANCE: Social History   Tobacco Use   Smoking status: Former    Packs/day: 1.00    Years: 30.00    Total pack years: 30.00    Types: Cigarettes    Quit date: 12/11/2013    Years since quitting: 8.8   Smokeless tobacco: Former  Scientific laboratory technician Use: Never used  Substance Use Topics  Alcohol use: Not Currently   Drug use: No     Colonoscopy:  PAP:  Bone density:  Mammogram:  No Known Allergies  Current Outpatient Medications  Medication Sig Dispense Refill   baclofen (LIORESAL) 10 MG tablet Take 1 tablet (10 mg total) by mouth 3 (three) times daily as needed for muscle spasms. 1080 tablet 0   calcium carbonate (OSCAL) 1500 (600 Ca) MG TABS tablet Take by mouth 2 (two) times daily with a meal.     diclofenac Sodium (VOLTAREN) 1 % GEL Apply 2 g topically 3 (three) times daily as needed. 100 g 2   famotidine (PEPCID) 20 MG tablet TAKE 1 TABLET BY MOUTH TWICE A DAY BEFORE MEALS 180 tablet 1   fluticasone (FLONASE) 50 MCG/ACT nasal spray PLACE 2 SPRAYS INTO BOTH NOSTRILS AT BEDTIME. 48 g 1   fulvestrant (FASLODEX) 250 MG/5ML injection Inject 500 mg into the muscle every 30 (thirty) days. One injection each  buttock over 1-2 minutes. Warm prior to use.     Krill Oil 1000 MG CAPS Take 1,000 mg by mouth daily.     lisinopril (ZESTRIL) 5 MG tablet TAKE 1 TABLET BY MOUTH EVERY DAY IN THE EVENING 90 tablet 3   metFORMIN (GLUCOPHAGE) 500 MG tablet TAKE 1 TABLET BY MOUTH 2 TIMES DAILY WITH A MEAL. 180 tablet 2   mometasone (ELOCON) 0.1 % cream Apply to affected skin qd-bid prn flares 45 g 2   omeprazole (PRILOSEC) 40 MG capsule TAKE 1 CAPSULE (40 MG TOTAL) BY MOUTH 2 (TWO) TIMES DAILY BEFORE A MEAL. 180 capsule 1   ribociclib succ (KISQALI, 600 MG DOSE,) 200 MG Therapy Pack Take 3 tablets (600 mg total) by mouth daily. Take for 21 days on, 7 days off, repeat every 28 days. 63 tablet 5   traZODone (DESYREL) 50 MG tablet TAKE 1 TABLET BY MOUTH AT BEDTIME AS NEEDED FOR SLEEP 90 tablet 0   venlafaxine XR (EFFEXOR-XR) 75 MG 24 hr capsule Take 1 capsule (75 mg total) by mouth daily with breakfast. 360 capsule 0   vitamin B-12 (CYANOCOBALAMIN) 1000 MCG tablet Take 2,500 mcg by mouth every Monday, Wednesday, and Friday.     Vitamin D, Cholecalciferol, 50 MCG (2000 UT) CAPS Take 2,000 Units by mouth every Monday, Wednesday, and Friday.     acetaminophen (TYLENOL) 500 MG tablet Take 1,000 mg by mouth 2 (two) times daily as needed for moderate pain or headache. (Patient not taking: Reported on 10/22/2022)     loperamide (IMODIUM A-D) 2 MG tablet Take 2 tablets (4 mg total) by mouth 4 (four) times daily as needed for diarrhea or loose stools. (Patient not taking: Reported on 09/24/2022) 30 tablet 0   prochlorperazine (COMPAZINE) 10 MG tablet Take 1 tablet (10 mg total) by mouth every 6 (six) hours as needed for nausea or vomiting. (Patient not taking: Reported on 10/22/2022) 20 tablet 1   sodium chloride (OCEAN) 0.65 % SOLN nasal spray Place 1 spray into both nostrils as needed for congestion. (Patient not taking: Reported on 07/30/2022)     No current facility-administered medications for this visit.    OBJECTIVE: LMP  12/22/1999 (Approximate)    There is no height or weight on file to calculate BMI.    ECOG FS:0 - Asymptomatic  General: Well-developed, well-nourished, no acute distress. Eyes: Pink conjunctiva, anicteric sclera. HEENT: Normocephalic, moist mucous membranes. Lungs: No audible wheezing or coughing. Heart: Regular rate and rhythm. Abdomen: Soft, nontender, no obvious distention. Musculoskeletal: No edema,  cyanosis, or clubbing. Neuro: Alert, answering all questions appropriately. Cranial nerves grossly intact. Skin: No rashes or petechiae noted. Psych: Normal affect.  LAB RESULTS:  Appointment on 10/22/2022  Component Date Value Ref Range Status   Magnesium 10/22/2022 1.8  1.7 - 2.4 mg/dL Final   Performed at [facility], [address] [place], [place] [address]   Sodium 10/22/2022 140  135 - 145 mmol/L Final   Potassium 10/22/2022 3.8  3.5 - 5.1 mmol/L Final   Chloride 10/22/2022 105  98 - 111 mmol/L Final   CO2 10/22/2022 26  22 - 32 mmol/L Final   Glucose, Bld 10/22/2022 147 (H)  70 - 99 mg/dL Final   Glucose reference range applies only to samples taken after fasting for at least 8 hours.   BUN 10/22/2022 20  8 - 23 mg/dL Final   Creatinine, Ser 10/22/2022 1.53 (H)  0.44 - 1.00 mg/dL Final   Calcium 10/22/2022 9.4  8.9 - 10.3 mg/dL Final   Total Protein 10/22/2022 7.4  6.5 - 8.1 g/dL Final   Albumin 10/22/2022 4.0  3.5 - 5.0 g/dL Final   AST 10/22/2022 33  15 - 41 U/L Final   ALT 10/22/2022 19  0 - 44 U/L Final   Alkaline Phosphatase 10/22/2022 69  38 - 126 U/L Final   Total Bilirubin 10/22/2022 0.3  0.3 - 1.2 mg/dL Final   GFR, Estimated 10/22/2022 36 (L)  >60 mL/min Final   Comment: (NOTE) Calculated using the CKD-EPI Creatinine Equation (2021)    Anion gap 10/22/2022 9  5 - 15 Final   Performed at [facility], [place], [place] [address]   WBC 10/22/2022 4.5  4.0 - 10.5 K/uL Final   RBC 10/22/2022 2.56 (L)  3.87 - 5.11 MIL/uL Final    Hemoglobin 10/22/2022 9.6 (L)  12.0 - 15.0 g/dL Final   HCT 10/22/2022 27.6 (L)  36.0 - 46.0 % Final   MCV 10/22/2022 107.8 (H)  80.0 - 100.0 fL Final   MCH 10/22/2022 37.5 (H)  26.0 - 34.0 pg Final   MCHC 10/22/2022 34.8  30.0 - 36.0 g/dL Final   RDW 10/22/2022 14.2  11.5 - 15.5 % Final   Platelets 10/22/2022 180  150 - 400 K/uL Final   nRBC 10/22/2022 0.0  0.0 - 0.2 % Final   Neutrophils Relative % 10/22/2022 49  % Final   Neutro Abs 10/22/2022 2.2  1.7 - 7.7 K/uL Final   Lymphocytes Relative 10/22/2022 32  % Final   Lymphs Abs 10/22/2022 1.4  0.7 - 4.0 K/uL Final   Monocytes Relative 10/22/2022 15  % Final   Monocytes Absolute 10/22/2022 0.7  0.1 - 1.0 K/uL Final   Eosinophils Relative 10/22/2022 2  % Final   Eosinophils Absolute 10/22/2022 0.1  0.0 - 0.5 K/uL Final   Basophils Relative 10/22/2022 2  % Final   Basophils Absolute 10/22/2022 0.1  0.0 - 0.1 K/uL Final   Immature Granulocytes 10/22/2022 0  % Final   Abs Immature Granulocytes 10/22/2022 0.01  0.00 - 0.07 K/uL Final   Performed at [facility], [address] [place], [place] [address]  Orders Only on 10/20/2022  Component Date Value Ref Range Status   TSH 10/21/2022 0.84  0.40 - 4.50 mIU/L Final   Hgb A1c MFr Bld 10/21/2022 5.8 (H)  <5.7 % of total Hgb Final   Comment: For someone without known diabetes, a hemoglobin  A1c value between 5.7% and  6.4% is consistent with prediabetes and should be confirmed with a  follow-up test. . For someone with known diabetes, a value <7% indicates that their diabetes is well controlled. A1c targets should be individualized based on duration of diabetes, age, comorbid conditions, and other considerations. . This assay result is consistent with an increased risk of diabetes. . Currently, no consensus exists regarding use of hemoglobin A1c for diagnosis of diabetes for children. .    Mean Plasma Glucose 10/21/2022 120  mg/dL Final   eAG (mmol/L) 10/21/2022 6.6  mmol/L  Final   Cholesterol 10/21/2022 194  <200 mg/dL Final   HDL 10/21/2022 35 (L)  > OR = 50 mg/dL Final   Triglycerides 10/21/2022 307 (H)  <150 mg/dL Final   Comment: . If a non-fasting specimen was collected, consider repeat triglyceride testing on a fasting specimen if clinically indicated.  Yates Decamp et al. J. of Clin. Lipidol. 4008;6:761-950. Marland Kitchen    LDL Cholesterol (Calc) 10/21/2022 118 (H)  mg/dL (calc) Final   Comment: Reference range: <100 . Desirable range <100 mg/dL for primary prevention;   <70 mg/dL for patients with CHD or diabetic patients  with > or = 2 CHD risk factors. Marland Kitchen LDL-C is now calculated using the Martin-Hopkins  calculation, which is a validated novel method providing  better accuracy than the Friedewald equation in the  estimation of LDL-C.  Cresenciano Genre et al. Annamaria Helling. 9326;712(45): 2061-2068  (http://education.[facility].com/faq/FAQ164)    Total CHOL/HDL Ratio 10/21/2022 5.5 (H)  <5.0 (calc) Final   Non-HDL Cholesterol (Calc) 10/21/2022 159 (H)  <130 mg/dL (calc) Final   Comment: For patients with diabetes plus 1 major ASCVD risk  factor, treating to a non-HDL-C goal of <100 mg/dL  (LDL-C of <70 mg/dL) is considered a therapeutic  option.     STUDIES: No results found.  ONCOLOGY HISTORY:  Bilateral adenocarcinoma of the breast. Patient is status post bilateral mastectomy in February 2015. She also received adjuvant chemotherapy with Adriamycin, Cytoxan, and Taxol completing treatment in approximately October 2015. She then initiated letrozole, but could not tolerate secondary to leg pain and was switched to Aromasin.  Patient then switched to tamoxifen in October 2019 secondary to cost.  Patient subsequently discontinued tamoxifen and did not complete the recommended 5 years of treatment.   ASSESSMENT: Recurrent, metastatic ER/PR, HER2 negative invasive carcinoma of the breast.   PLAN:    1. Recurrent, metastatic ER/PR, HER2 negative invasive carcinoma  of the breast: See oncology history as above.  Biopsy confirmed recurrent disease.  PET scan results from December 26, 2021 reviewed independently with hypermetabolic left supraclavicular, subpectoral, and axillary lymphadenopathy consistent with nodal recurrence of patient's history of breast cancer.  No distant metastases were identified.  Repeat PET scan on April 23, 2022 reviewed independently with significant improvement of patient's disease burden.  Her initial CA 27-29 initially was 260.1.  Now seems to have plateaued between 86 and 91.  Today's result is pending. Continue Kisqali 600 mg daily for 21 days with 7 days off.  Proceed with fulvestrant today.  Return to clinic in 4 weeks for further evaluation by clinical pharmacy and continuation of treatment.  Patient will then return to clinic in 8 weeks for MD evaluation and continuation of treatment. 2.  Osteopenia: Patient's last bone mineral density was on August 01, 2016 and revealed a T-score of -1.2. Continue monitoring bone mineral density by PCP. 3.  Multiple sclerosis: Chronic.  Continue evaluation and treatment per neurology.  4.  Esophageal discomfort/mild  dysphagia: Patient does not complain of this today.  Recent colonoscopy and EGD did not reveal any significant pathology. 5.  Renal insufficiency: Chronic and unchanged.  Patient's GFR stable at 36.  Kisqali needs to be dose reduced if GFR falls below 30. 6.  Anemia: Chronic and unchanged.  Patient's hemoglobin is 9.6.  Consider transfusion if falls below 8.0.   7.  Diarrhea: Patient admits to loose stools, but not diarrhea.  She takes Imodium as needed 8.  Hypomagnesia: Resolved. 9.  Hypokalemia: Resolved.  Patient expressed understanding and was in agreement with this plan. She also understands that She can call clinic at any time with any questions, concerns, or complaints.   Breast cancer bilateral, multifocal ER/PR pos, Her 2 neg,.   Staging form: Breast, AJCC 7th Edition      Clinical: Stage IIB (T2, N1, M0) - Signed by Forest Gleason, MD on 04/22/2015   Lloyd Huger, MD   10/23/2022 6:40 AM

## 2022-10-22 NOTE — Telephone Encounter (Signed)
Oral Oncology Patient Advocate Encounter   Submitted application for assistance for Kisqali to [employer].   Application submitted via e-fax to [employer]' phone number [phone].   I will continue to check the status until final determination.   Berdine Addison, [facility] Patient [place]  [phone] (phone) [phone] (fax) 10/22/2022 4:16 PM

## 2022-10-23 ENCOUNTER — Encounter: Payer: Self-pay | Admitting: Oncology

## 2022-10-23 LAB — CANCER ANTIGEN 27.29: CA 27.29: 87.6 U/mL — ABNORMAL HIGH (ref 0.0–38.6)

## 2022-10-29 ENCOUNTER — Encounter: Payer: Self-pay | Admitting: Family Medicine

## 2022-10-29 ENCOUNTER — Ambulatory Visit (INDEPENDENT_AMBULATORY_CARE_PROVIDER_SITE_OTHER): Payer: Medicare Other | Admitting: Family Medicine

## 2022-10-29 VITALS — BP 128/64 | HR 97 | Ht 64.0 in | Wt 184.4 lb

## 2022-10-29 DIAGNOSIS — G35 Multiple sclerosis: Secondary | ICD-10-CM | POA: Diagnosis not present

## 2022-10-29 DIAGNOSIS — I10 Essential (primary) hypertension: Secondary | ICD-10-CM

## 2022-10-29 DIAGNOSIS — E1169 Type 2 diabetes mellitus with other specified complication: Secondary | ICD-10-CM

## 2022-10-29 DIAGNOSIS — E669 Obesity, unspecified: Secondary | ICD-10-CM

## 2022-10-29 DIAGNOSIS — G720 Drug-induced myopathy: Secondary | ICD-10-CM

## 2022-10-29 DIAGNOSIS — F5101 Primary insomnia: Secondary | ICD-10-CM | POA: Diagnosis not present

## 2022-10-29 DIAGNOSIS — G4700 Insomnia, unspecified: Secondary | ICD-10-CM | POA: Diagnosis not present

## 2022-10-29 DIAGNOSIS — J432 Centrilobular emphysema: Secondary | ICD-10-CM

## 2022-10-29 DIAGNOSIS — C50919 Malignant neoplasm of unspecified site of unspecified female breast: Secondary | ICD-10-CM

## 2022-10-29 DIAGNOSIS — I700 Atherosclerosis of aorta: Secondary | ICD-10-CM

## 2022-10-29 DIAGNOSIS — E785 Hyperlipidemia, unspecified: Secondary | ICD-10-CM | POA: Diagnosis not present

## 2022-10-29 DIAGNOSIS — G620 Drug-induced polyneuropathy: Secondary | ICD-10-CM

## 2022-10-29 MED ORDER — TRAZODONE HCL 50 MG PO TABS: 50.0000 mg | ORAL_TABLET | Freq: Every evening | ORAL | 3 refills | Status: DC | PRN

## 2022-10-29 MED ORDER — VENLAFAXINE HCL ER 75 MG PO CP24: 75.0000 mg | ORAL_CAPSULE | Freq: Every day | ORAL | 0 refills | Status: DC

## 2022-10-29 NOTE — Assessment & Plan Note (Signed)
A1c 5.8 stable to improved, well controlled No hypoglycemia. Complications - peripheral neuropathy mostly due to chemotherapy toxicity, other including hyperlipidemia, GERD, , obesity, hypothyroidism, OSA - increases risk of future cardiovascular complications  OFF Metformin not needed Did not take Rybelsus  Plan:  1. Continue Metformin 2. Encourage improved lifestyle - low carb, low sugar diet, reduce portion size, continue improving regular exercise 3. Check CBG if needed 4. Continue ACEi, - refused restart Statin - due to myopathy

## 2022-10-29 NOTE — Assessment & Plan Note (Signed)
On imaging Asymptomatic not on maintenance

## 2022-10-29 NOTE — Patient Instructions (Addendum)
Thank you for coming to the office today.  Cholesterol panel with mild low HDL (good cholesterol) and LDL is improved with 118, this is good news. Triglyceride level has been elevated slightly 300 range, this can be from carbs starches sugars, I am not worried about this.  Recent Labs    02/23/22 0953 04/28/22 0854 10/21/22 0815  HGBA1C 6.2* 5.9* 5.8*   Re ordered Venlafaxine Effexor to [facility] for 360 days  Refilled Trazodone '50mg'$   Recommend topical muscle rub, voltaren, range of motion / exercise to help the back muscles.  Please schedule a Follow-up Appointment to: Return in about 6 months (around 04/30/2023) for 6 month follow-up DM A1c, updates Oncology.  If you have any other questions or concerns, please feel free to call the office or send a message through [place]. You may also schedule an earlier appointment if necessary.  Additionally, you may be receiving a survey about your experience at our office within a few days to 1 week by e-mail or mail. We value your feedback.  Nobie Putnam, DO [place]

## 2022-10-29 NOTE — Telephone Encounter (Signed)
Received notification from [employer] that they were missing Proof of Income. Patient is waiting for SSI letter to come in mail so we can submit. I will continue to follow and update.  Berdine Addison, [facility] Patient [place]  [phone] (phone) [phone] (fax) 10/29/2022 11:32 AM

## 2022-10-29 NOTE — Assessment & Plan Note (Addendum)
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 May increase Fish Oil again, reconsider Statin vs Zetia in future 2. Continue ASA '81mg'$  for primary ASCVD risk reduction 3. Encourage improved lifestyle - low carb/cholesterol, reduce portion size, continue improving regular exercise

## 2022-10-29 NOTE — Assessment & Plan Note (Signed)
Secondary to statin Remains off therapy - reconsider alternative statin vs zetia next time

## 2022-10-29 NOTE — Assessment & Plan Note (Signed)
Well-controlled HYPERTENSION, repeat normal - Home BP readings normal  Improved Creatinine  NSAID meloxicam OFF Carvedilol from prior visit 03/2018, not indicated if normal HR and no cardiac etiology.    Plan:  1. Continue current BP regimen - Lisinopril '5mg'$  daily - refill 2. Encourage improved lifestyle - low sodium diet, regular exercise 3. Continue monitor BP outside office, bring readings to next visit, if persistently >140/90 or new symptoms notify office sooner

## 2022-10-29 NOTE — Progress Notes (Signed)
Subjective:    Patient ID: Cassidy Norris, female    DOB: 1950/12/03, 71 y.o.   MRN: [id number]  Cassidy Norris is a 71 y.o. female presenting on 10/29/2022 for Annual Exam   HPI  Here for Annual Physical and Lab Review  Recurrent, metastatic ER/PR, HER2 negative Breast Cancer Followed by Dr Grayland Ormond, [facility] PET Scan from Feb 2023, and biopsy proven recurrent disease Initial CA 27-29 260.1, now stable between 86-91 On oral chemotherapy Kisqali 672m daily for 21 days then 7 days off, she had loose stools on med. She did lose 20 lbs initially. She came off stool softeners.  Anemia, chronic stable unchanged Hgb 9.6 previously  Creatinine CKD-IIIb Last lab Cr 1.53, GFR 36 Followed by Oncology On chemotherapy Potassium has stabilized and Mag has normalized.   Right Knee Right knee incision with some issues with healing now improved, had small cystlike swelling   CHRONIC DM, Type 2: A1c down to 5.8, previously >6-7 range. CBG Meds: Metformin 500 BID Reports good compliance. Tolerating well w/o side-effects Currently on ACEi Lifestyle: - Diet (admits starches were increasing her sugars now improving)  Denies hypoglycemia, polyuria, visual changes, numbness or tingling.  Cataracts Followed by Eye Doctor   HYPERLIPIDEMIA: - Reports concerns with slight elevated TG but improved LDL. Last lipid panel 09/2022, improved LDL 118 from 124 On Krill Oil, not on statin due to myopathy    Chemotherapy Induced Neuropathy Chronic problem with nerve pain Previous treatment not effective  Insomnia Controlled on Trazodone 556mnightly, refills due today  Health Maintenance: Declines Vaccines.     10/29/2022   10:12 AM 05/21/2022   11:26 AM 04/28/2022    8:53 AM  Depression screen PHQ 2/9  Decreased Interest 0 0 0  Down, Depressed, Hopeless 0 0 0  PHQ - 2 Score 0 0 0  Altered sleeping _0 Tired, decreased energy 0  0  Change in appetite 0 0 1  Feeling bad or  failure about yourself  0 0 0  Trouble concentrating 0 0 0  Moving slowly or fidgety/restless 0 0 0  Suicidal thoughts 0 0 0  PHQ-9 Score _1 Difficult doing work/chores Not difficult at all Not difficult at all Not difficult at all    Past Medical History:  Diagnosis Date   Anemia    Arthritis not really diagnosis but have pain   B12 deficiency 07/23/2016   Will check levels to determine if injections are needed again. Await results. Recent CBC at Onc WNL   Blood transfusion without reported diagnosis 1983 had a miscarriage/dnc   Cancer ([facility]02/05/2014   Multifocal disease: pT1c,m, N0(isolated tumor cells) Er/ PR positive, Her 2 negative.   Cancer ([facility]CR[place]02/17/2015   Invasive lobular carcinoma, 2.2 cm, T2,N1 ER/PR positive, HER-2/neu negative   Cataract 2014?   Centrilobular emphysema (HCS[place]1210/21/1173 Complication of anesthesia 03/06/2022   Prolonged metabolism of Rocuronium during case.   Diabetes mellitus without complication ([place]   Diffuse cystic mastopathy    Erosive esophagitis    Essential hypertension, benign 09/24/2017   Family history of malignant neoplasm of gastrointestinal tract 2012   Fractured elbow 08/28/2014   Gastroesophageal reflux disease    Hx of metabolic acidosis with increased anion gap    Increased heart rate    Multiple sclerosis (HCC)    Neuromuscular disorder (HCC)    neuropathy in bil feet - left is worse.   Obesity, unspecified  Peripheral neuropathy    Personal history of tobacco use, presenting hazards to health    Restless leg syndrome    Sleep apnea    uses CPAP   Special screening for malignant neoplasms, colon    Squamous cell carcinoma of skin 08/24/2022   SCCIS - scalp, ED&C   Past Surgical History:  Procedure Laterality Date   BREAST BIOPSY Right 1973,2001   BREAST BIOPSY Right 11/07/2013   INVASIVE MAMMARY CARCINOMA , ER/PR positive Her2 negative   BREAST BIOPSY Left 11/27/2013   invasive lobular and DCIS   BREAST  SURGERY Bilateral 01/09/2014   mastectomy   cataract surgery Left 08/13/2022   CHOLECYSTECTOMY     COLONOSCOPY  2010   Dr. Jamal Collin   COLONOSCOPY WITH PROPOFOL N/A 06/11/2015   Procedure: COLONOSCOPY WITH PROPOFOL;  Surgeon: Christene Lye, MD;  Location: [facility] ENDOSCOPY;  Service: Endoscopy;  Laterality: N/A;   COLONOSCOPY WITH PROPOFOL N/A 12/23/2021   Procedure: COLONOSCOPY WITH PROPOFOL;  Surgeon: Lin Landsman, MD;  Location: [facility] ENDOSCOPY;  Service: Gastroenterology;  Laterality: N/A;   DILATATION & CURETTAGE/HYSTEROSCOPY WITH MYOSURE N/A 07/12/2018   Procedure: DILATATION & CURETTAGE/HYSTEROSCOPY WITH MYOSURE WITH ENDOMETRIAL POLYPECTOMY;  Surgeon: Will Bonnet, MD;  Location: [facility] ORS;  Service: Gynecology;  Laterality: N/A;   DILATION AND CURETTAGE OF UTERUS  1983   ESOPHAGOGASTRODUODENOSCOPY N/A 12/23/2021   Procedure: ESOPHAGOGASTRODUODENOSCOPY (EGD);  Surgeon: Lin Landsman, MD;  Location[facility] ENDOSCOPY;  Service: Gastroenterology;  Laterality: N/A;   JOINT REPLACEMENT  11/23/2015   KNEE ARTHROPLASTY Right 03/06/2022   Procedure: COMPUTER ASSISTED TOTAL KNEE ARTHROPLASTY;  Surgeon: Dereck Leep, MD;  Location: [facility] ORS;  Service: Orthopedics;  Laterality: Right;   POLYPECTOMY  1989   PORTA CATH REMOVAL     PORTACATH PLACEMENT  2015   SKIN BIOPSY     on scalp   SPINE SURGERY  09/14/2014   Ruptured disk L4- L5   TOTAL KNEE ARTHROPLASTY Left 11/22/2015   Procedure: TOTAL KNEE ARTHROPLASTY;  Surgeon: Earlie Server, MD;  Location: [place];  Service: Orthopedics;  Laterality: Left;   TUBAL LIGATION     WISDOM TOOTH EXTRACTION  2006   Social History   Socioeconomic History   Marital status: Widowed    Spouse name: Not on file   Number of children: Not on file   Years of education: some college   Highest education level: High school graduate  Occupational History   Occupation: retired  Tobacco Use   Smoking status: Former    Packs/day: 1.00     Years: 30.00    Total pack years: 30.00    Types: Cigarettes    Quit date: 12/11/2013    Years since quitting: 8.8   Smokeless tobacco: Former  Scientific laboratory technician Use: Never used  Substance and Sexual Activity   Alcohol use: Not Currently   Drug use: No   Sexual activity: Not Currently    Birth control/protection: Post-menopausal  Other Topics Concern   Not on file  Social History Narrative   Right handed   Drinks caffeine   One story home   Social Determinants of Health   Financial Resource Strain: Low Risk  (05/09/2019)   Overall Financial Resource Strain (CARDIA)    Difficulty of Paying Living Expenses: Not hard at all  Food Insecurity: No Food Insecurity (05/09/2019)   Hunger Vital Sign    Worried About Running Out of Food in the Last Year: Never true    Ran  Out of Food in the Last Year: Never true  Transportation Needs: No Transportation Needs (05/09/2019)   PRAPARE - Hydrologist (Medical): No    Lack of Transportation (Non-Medical): No  Physical Activity: Inactive (05/09/2019)   Exercise Vital Sign    Days of Exercise per Week: 0 days    Minutes of Exercise per Session: 0 min  Stress: Not on file  Social Connections: Not on file  Intimate Partner Violence: Not on file   Family History  Problem Relation Age of Onset   Cancer Mother        lung   COPD Mother    Cancer Father        colon, stomach   Diabetes Maternal Grandmother    Hypertension Sister    Rectal cancer Paternal Uncle    Rectal cancer Paternal Uncle    Current Outpatient Medications on File Prior to Visit  Medication Sig   baclofen (LIORESAL) 10 MG tablet Take 1 tablet (10 mg total) by mouth 3 (three) times daily as needed for muscle spasms.   calcium carbonate (OSCAL) 1500 (600 Ca) MG TABS tablet Take by mouth 2 (two) times daily with a meal.   diclofenac Sodium (VOLTAREN) 1 % GEL Apply 2 g topically 3 (three) times daily as needed.   famotidine (PEPCID) 20 MG  tablet TAKE 1 TABLET BY MOUTH TWICE A DAY BEFORE MEALS   fluticasone (FLONASE) 50 MCG/ACT nasal spray PLACE 2 SPRAYS INTO BOTH NOSTRILS AT BEDTIME.   fulvestrant (FASLODEX) 250 MG/5ML injection Inject 500 mg into the muscle every 30 (thirty) days. One injection each buttock over 1-2 minutes. Warm prior to use.   Krill Oil 1000 MG CAPS Take 1,000 mg by mouth daily.   lisinopril (ZESTRIL) 5 MG tablet TAKE 1 TABLET BY MOUTH EVERY DAY IN THE EVENING   metFORMIN (GLUCOPHAGE) 500 MG tablet TAKE 1 TABLET BY MOUTH 2 TIMES DAILY WITH A MEAL.   mometasone (ELOCON) 0.1 % cream Apply to affected skin qd-bid prn flares   prochlorperazine (COMPAZINE) 10 MG tablet Take 1 tablet (10 mg total) by mouth every 6 (six) hours as needed for nausea or vomiting.   ribociclib succ (KISQALI, 600 MG DOSE,) 200 MG Therapy Pack Take 3 tablets (600 mg total) by mouth daily. Take for 21 days on, 7 days off, repeat every 28 days.   sodium chloride (OCEAN) 0.65 % SOLN nasal spray Place 1 spray into both nostrils as needed for congestion.   vitamin B-12 (CYANOCOBALAMIN) 1000 MCG tablet Take 2,500 mcg by mouth every Monday, Wednesday, and Friday.   Vitamin D, Cholecalciferol, 50 MCG (2000 UT) CAPS Take 2,000 Units by mouth every Monday, Wednesday, and Friday.   acetaminophen (TYLENOL) 500 MG tablet Take 1,000 mg by mouth 2 (two) times daily as needed for moderate pain or headache. (Patient not taking: Reported on 10/22/2022)   loperamide (IMODIUM A-D) 2 MG tablet Take 2 tablets (4 mg total) by mouth 4 (four) times daily as needed for diarrhea or loose stools. (Patient not taking: Reported on 09/24/2022)   omeprazole (PRILOSEC) 40 MG capsule TAKE 1 CAPSULE (40 MG TOTAL) BY MOUTH 2 (TWO) TIMES DAILY BEFORE A MEAL.   No current facility-administered medications on file prior to visit.    Review of Systems  Constitutional:  Negative for activity change, appetite change, chills, diaphoresis, fatigue and fever.  HENT:  Negative for  congestion and hearing loss.   Eyes:  Negative for visual disturbance.  Respiratory:  Negative for cough, chest tightness, shortness of breath and wheezing.   Cardiovascular:  Negative for chest pain, palpitations and leg swelling.  Gastrointestinal:  Negative for abdominal pain, constipation, diarrhea, nausea and vomiting.  Genitourinary:  Negative for dysuria, frequency and hematuria.  Musculoskeletal:  Positive for back pain (R sided). Negative for arthralgias and neck pain.  Skin:  Negative for rash.  Neurological:  Negative for dizziness, weakness, light-headedness, numbness and headaches.  Hematological:  Negative for adenopathy.  Psychiatric/Behavioral:  Negative for behavioral problems, dysphoric mood and sleep disturbance.    Per HPI unless specifically indicated above      Objective:    BP 128/64 (BP Location: Left Arm, Cuff Size: Normal)   Pulse 97   Ht _0  (1.626 m)   Wt 184 lb 6.4 oz (83.6 kg)   LMP 12/22/1999 (Approximate)   SpO2 100%   BMI 31.65 kg/m   Wt Readings from Last 3 Encounters:  10/29/22 184 lb 6.4 oz (83.6 kg)  09/24/22 185 lb (83.9 kg)  08/27/22 184 lb (83.5 kg)    Physical Exam Vitals and nursing note reviewed.  Constitutional:      General: She is not in acute distress.    Appearance: She is well-developed. She is not diaphoretic.     Comments: Well-appearing, comfortable, cooperative  HENT:     Head: Normocephalic and atraumatic.  Eyes:     General:        Right eye: No discharge.        Left eye: No discharge.     Conjunctiva/sclera: Conjunctivae normal.     Pupils: Pupils are equal, round, and reactive to light.  Neck:     Thyroid: No thyromegaly.     Vascular: No carotid bruit.  Cardiovascular:     Rate and Rhythm: Normal rate and regular rhythm.     Pulses: Normal pulses.     Heart sounds: Normal heart sounds. No murmur heard. Pulmonary:     Effort: Pulmonary effort is normal. No respiratory distress.     Breath sounds: Normal  breath sounds. No wheezing or rales.  Abdominal:     General: Bowel sounds are normal. There is no distension.     Palpations: Abdomen is soft. There is no mass.     Tenderness: There is no abdominal tenderness.  Musculoskeletal:        General: No tenderness. Normal range of motion.     Cervical back: Normal range of motion and neck supple.     Right lower leg: No edema.     Left lower leg: No edema.     Comments: Upper / Lower Extremities: - Normal muscle tone, strength bilateral upper extremities 5/5, lower extremities 5/5  Varicose veins spider veins mild, limited trace edema  Lymphadenopathy:     Cervical: No cervical adenopathy.  Skin:    General: Skin is warm and dry.     Findings: No erythema or rash.  Neurological:     Mental Status: She is alert and oriented to person, place, and time.     Comments: Distal sensation intact to light touch all extremities  Psychiatric:        Mood and Affect: Mood normal.        Behavior: Behavior normal.        Thought Content: Thought content normal.     Comments: Well groomed, good eye contact, normal speech and thoughts     Diabetic Foot Exam - Simple   Simple  Foot Form Diabetic Foot exam was performed with the following findings: Yes 10/29/2022  8:57 AM  Visual Inspection See comments: Yes Sensation Testing See comments: Yes Pulse Check Posterior Tibialis and Dorsalis pulse intact bilaterally: Yes Comments Left > R with worse with underlying neuropathy reduced monofilament sensation. Dry skin. No callus formation or ulceration.     Recent Labs    02/23/22 0953 04/28/22 0854 10/21/22 0815  HGBA1C 6.2* 5.9* 5.8*     Results for orders placed or performed in visit on 10/22/22  Cancer antigen 27.29  Result Value Ref Range   CA 27.29 87.6 (H) 0.0 - 38.6 U/mL  Magnesium  Result Value Ref Range   Magnesium 1.8 1.7 - 2.4 mg/dL  Comprehensive metabolic panel  Result Value Ref Range   Sodium 140 135 - 145 mmol/L    Potassium 3.8 3.5 - 5.1 mmol/L   Chloride 105 98 - 111 mmol/L   CO2 26 22 - 32 mmol/L   Glucose, Bld 147 (H) 70 - 99 mg/dL   BUN 20 8 - 23 mg/dL   Creatinine, Ser 1.53 (H) 0.44 - 1.00 mg/dL   Calcium 9.4 8.9 - 10.3 mg/dL   Total Protein 7.4 6.5 - 8.1 g/dL   Albumin 4.0 3.5 - 5.0 g/dL   AST 33 15 - 41 U/L   ALT 19 0 - 44 U/L   Alkaline Phosphatase 69 38 - 126 U/L   Total Bilirubin 0.3 0.3 - 1.2 mg/dL   GFR, Estimated 36 (L) >60 mL/min   Anion gap 9 5 - 15  CBC with Differential/Platelet  Result Value Ref Range   WBC 4.5 4.0 - 10.5 K/uL   RBC 2.56 (L) 3.87 - 5.11 MIL/uL   Hemoglobin 9.6 (L) 12.0 - 15.0 g/dL   HCT 27.6 (L) 36.0 - 46.0 %   MCV 107.8 (H) 80.0 - 100.0 fL   MCH 37.5 (H) 26.0 - 34.0 pg   MCHC 34.8 30.0 - 36.0 g/dL   RDW 14.2 11.5 - 15.5 %   Platelets 180 150 - 400 K/uL   nRBC 0.0 0.0 - 0.2 %   Neutrophils Relative % 49 %   Neutro Abs 2.2 1.7 - 7.7 K/uL   Lymphocytes Relative 32 %   Lymphs Abs 1.4 0.7 - 4.0 K/uL   Monocytes Relative 15 %   Monocytes Absolute 0.7 0.1 - 1.0 K/uL   Eosinophils Relative 2 %   Eosinophils Absolute 0.1 0.0 - 0.5 K/uL   Basophils Relative 2 %   Basophils Absolute 0.1 0.0 - 0.1 K/uL   Immature Granulocytes 0 %   Abs Immature Granulocytes 0.01 0.00 - 0.07 K/uL      Assessment & Plan:   Problem List Items Addressed This Visit     Aortic atherosclerosis (HCC)    Known vascular disease, on imaging No longer on statin therapy      Centrilobular emphysema (HCC)    On imaging Asymptomatic not on maintenance      Drug-induced myopathy    Secondary to statin Remains off therapy - reconsider alternative statin vs zetia next time      Essential hypertension, benign - Primary    Well-controlled HYPERTENSION, repeat normal - Home BP readings normal  Improved Creatinine  NSAID meloxicam OFF Carvedilol from prior visit 03/2018, not indicated if normal HR and no cardiac etiology.    Plan:  1. Continue current BP regimen -  Lisinopril 20m daily - refill 2. Encourage improved lifestyle - low sodium diet,  regular exercise 3. Continue monitor BP outside office, bring readings to next visit, if persistently >140/90 or new symptoms notify office sooner      Hyperlipidemia associated with type 2 diabetes mellitus ([place])    Improved LDL but mild high TG Last lipid panel 09/2022  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 May increase Fish Oil again, reconsider Statin vs Zetia in future 2. Continue ASA 15m for primary ASCVD risk reduction 3. Encourage improved lifestyle - low carb/cholesterol, reduce portion size, continue improving regular exercise      Insomnia   Relevant Medications   venlafaxine XR (EFFEXOR-XR) 75 MG 24 hr capsule   traZODone (DESYREL) 50 MG tablet   Multiple sclerosis (HCC)   Peripheral neuropathy due to chemotherapy (HCC)    Chronic problem, stable Secondary to chemotherapy Less likely secondary to diabetes as well      Relevant Medications   venlafaxine XR (EFFEXOR-XR) 75 MG 24 hr capsule   traZODone (DESYREL) 50 MG tablet   Recurrent breast cancer ([place]    Managed by Oncology Upcoming chemotherapy      Type 2 diabetes mellitus with other specified complication (HCC)    AZ0C5.8 stable to improved, well controlled No hypoglycemia. Complications - peripheral neuropathy mostly due to chemotherapy toxicity, other including hyperlipidemia, GERD, , obesity, hypothyroidism, OSA - increases risk of future cardiovascular complications  OFF Metformin not needed Did not take Rybelsus  Plan:  1. Continue Metformin 2. Encourage improved lifestyle - low carb, low sugar diet, reduce portion size, continue improving regular exercise 3. Check CBG if needed 4. Continue ACEi, - refused restart Statin - due to myopathy      Relevant Orders   Urine Microalbumin w/creat. ratio   Other Visit Diagnoses     Obesity (BMI 30.0-34.9)           Updated Health Maintenance  information Reviewed recent lab results with patient Encouraged improvement to lifestyle with diet and exercise Goal of weight loss   Re ordered Venlafaxine Effexor to [facility] for 360 days  Refilled Trazodone 522m Recommend topical muscle rub, voltaren, range of motion / exercise to help the back muscles.  Orders Placed This Encounter  Procedures   Urine Microalbumin w/creat. ratio     Meds ordered this encounter  Medications   venlafaxine XR (EFFEXOR-XR) 75 MG 24 hr capsule    Sig: Take 1 capsule (75 mg total) by mouth daily with breakfast.    Dispense:  360 capsule    Refill:  0   traZODone (DESYREL) 50 MG tablet    Sig: Take 1 tablet (50 mg total) by mouth at bedtime as needed. for sleep    Dispense:  90 tablet    Refill:  3    Add refills      Follow up plan: Return in about 6 months (around 04/30/2023) for 6 month follow-up DM A1c, updates Oncology.  [facility]ie [facility]nam[facility]ed[facility] 10/29/2022, 8:31 AM

## 2022-10-29 NOTE — Assessment & Plan Note (Signed)
Known vascular disease, on imaging No longer on statin therapy

## 2022-10-29 NOTE — Assessment & Plan Note (Signed)
Chronic problem, stable Secondary to chemotherapy Less likely secondary to diabetes as well

## 2022-10-29 NOTE — Assessment & Plan Note (Signed)
Managed by Oncology Upcoming chemotherapy

## 2022-10-29 NOTE — Telephone Encounter (Signed)
Patient will be dropping off Financial Documents Friday 12/08 for submission.   Cassidy Norris, [facility] Patient [place]  [phone] (phone) [phone] (fax) 10/29/2022 3:01 PM

## 2022-10-30 ENCOUNTER — Telehealth: Payer: Self-pay | Admitting: Family Medicine

## 2022-10-30 LAB — MICROALBUMIN / CREATININE URINE RATIO
Creatinine, Urine: 131 mg/dL (ref 20–275)
Microalb Creat Ratio: 5 mcg/mg creat (ref <30)
Microalb, Ur: 0.7 mg/dL

## 2022-10-30 NOTE — Telephone Encounter (Signed)
N/A unable to leave a message for patient to call back and schedule the Medicare Annual Wellness Visit (AWV) virtually or by telephone.  Last AWV 10/27/21  Please schedule at anytime with [place].    Any questions, please call me at [phone]

## 2022-11-02 NOTE — Telephone Encounter (Signed)
Faxed Proof of Income to [employer]. I will continue to follow and update.  Cassidy Norris, [facility] Patient [place]  [phone] (phone) [phone] (fax) 11/02/2022 8:22 AM

## 2022-11-03 NOTE — Telephone Encounter (Signed)
Oral Oncology Patient Advocate Encounter   Received notification re-enrollment for assistance for Kisqali through [employer] has been approved. Patient may continue to receive their medication at $0 from this program.    [employer]' phone number [phone].   Effective dates: 01.01.24 through 12.31.24  I have spoken to the patient.  Berdine Addison, [facility] Patient [place]  [phone] (phone) [phone] (fax) 11/03/2022 3:24 PM

## 2022-11-19 ENCOUNTER — Encounter: Payer: Self-pay | Admitting: Pharmacist

## 2022-11-19 ENCOUNTER — Inpatient Hospital Stay: Payer: Medicare Other

## 2022-11-19 ENCOUNTER — Inpatient Hospital Stay: Payer: Medicare Other | Admitting: Pharmacist

## 2022-11-19 ENCOUNTER — Inpatient Hospital Stay: Payer: Medicare Other | Attending: Oncology

## 2022-11-19 VITALS — BP 140/73 | HR 102 | Temp 97.7°F | Resp 18 | Ht 64.0 in | Wt 187.0 lb

## 2022-11-19 DIAGNOSIS — C50911 Malignant neoplasm of unspecified site of right female breast: Secondary | ICD-10-CM | POA: Insufficient documentation

## 2022-11-19 DIAGNOSIS — Z5111 Encounter for antineoplastic chemotherapy: Secondary | ICD-10-CM | POA: Diagnosis not present

## 2022-11-19 DIAGNOSIS — Z170 Estrogen receptor positive status [ER+]: Secondary | ICD-10-CM | POA: Insufficient documentation

## 2022-11-19 DIAGNOSIS — Z79899 Other long term (current) drug therapy: Secondary | ICD-10-CM | POA: Diagnosis not present

## 2022-11-19 DIAGNOSIS — C50919 Malignant neoplasm of unspecified site of unspecified female breast: Secondary | ICD-10-CM

## 2022-11-19 DIAGNOSIS — C50912 Malignant neoplasm of unspecified site of left female breast: Secondary | ICD-10-CM | POA: Diagnosis not present

## 2022-11-19 LAB — COMPREHENSIVE METABOLIC PANEL
ALT: 16 U/L (ref 0–44)
AST: 26 U/L (ref 15–41)
Albumin: 4 g/dL (ref 3.5–5.0)
Alkaline Phosphatase: 60 U/L (ref 38–126)
Anion gap: 10 (ref 5–15)
BUN: 20 mg/dL (ref 8–23)
CO2: 25 mmol/L (ref 22–32)
Calcium: 9.2 mg/dL (ref 8.9–10.3)
Chloride: 108 mmol/L (ref 98–111)
Creatinine, Ser: 1.54 mg/dL — ABNORMAL HIGH (ref 0.44–1.00)
GFR, Estimated: 36 mL/min — ABNORMAL LOW (ref >60)
Glucose, Bld: 113 mg/dL — ABNORMAL HIGH (ref 70–99)
Potassium: 3.8 mmol/L (ref 3.5–5.1)
Sodium: 143 mmol/L (ref 135–145)
Total Bilirubin: 0.4 mg/dL (ref 0.3–1.2)
Total Protein: 7.3 g/dL (ref 6.5–8.1)

## 2022-11-19 LAB — CBC WITH DIFFERENTIAL/PLATELET
Abs Immature Granulocytes: 0.02 10*3/uL (ref 0.00–0.07)
Basophils Absolute: 0.1 10*3/uL (ref 0.0–0.1)
Basophils Relative: 2 %
Eosinophils Absolute: 0.1 10*3/uL (ref 0.0–0.5)
Eosinophils Relative: 2 %
HCT: 26.1 % — ABNORMAL LOW (ref 36.0–46.0)
Hemoglobin: 9.1 g/dL — ABNORMAL LOW (ref 12.0–15.0)
Immature Granulocytes: 1 %
Lymphocytes Relative: 34 %
Lymphs Abs: 1.5 10*3/uL (ref 0.7–4.0)
MCH: 36.7 pg — ABNORMAL HIGH (ref 26.0–34.0)
MCHC: 34.9 g/dL (ref 30.0–36.0)
MCV: 105.2 fL — ABNORMAL HIGH (ref 80.0–100.0)
Monocytes Absolute: 0.7 10*3/uL (ref 0.1–1.0)
Monocytes Relative: 17 %
Neutro Abs: 1.9 10*3/uL (ref 1.7–7.7)
Neutrophils Relative %: 44 %
Platelets: 185 10*3/uL (ref 150–400)
RBC: 2.48 MIL/uL — ABNORMAL LOW (ref 3.87–5.11)
RDW: 14.6 % (ref 11.5–15.5)
WBC: 4.3 10*3/uL (ref 4.0–10.5)
nRBC: 0 % (ref 0.0–0.2)

## 2022-11-19 LAB — MAGNESIUM: Magnesium: 1.7 mg/dL (ref 1.7–2.4)

## 2022-11-19 MED ORDER — FULVESTRANT 250 MG/5ML IM SOSY
500.0000 mg | PREFILLED_SYRINGE | Freq: Once | INTRAMUSCULAR | Status: AC
  Administered 2022-11-19: 500 mg via INTRAMUSCULAR
  Filled 2022-11-19: qty 10

## 2022-11-19 NOTE — Progress Notes (Signed)
[facility]  Telephone:([phone] [phone]  Patient Care Team: [name], DO as PCP - General (Family Medicine) Pieter Partridge, DO as Consulting Physician (Neurology) Lloyd Huger, MD as Consulting Physician (Oncology)   Name of the patient: Cassidy Norris  [id number]  Dec 26, 1950   Date of visit: 11/19/22  HPI: Patient is a 71 y.o. female with recurrent breast cancer, ER/PR positive/HER2 negative. Currently treating with Kisqali (ribociclib) and fulvestrant. She started ribociclib on 01/08/22.  Reason for Consult: Oral chemotherapy follow-up for ribociclib therapy.   PAST MEDICAL HISTORY: Past Medical History:  Diagnosis Date   Anemia    Arthritis not really diagnosis but have pain   B12 deficiency 07/23/2016   Will check levels to determine if injections are needed again. Await results. Recent CBC at Onc WNL   Blood transfusion without reported diagnosis 1983 had a miscarriage/dnc   Cancer ([place]) 12/30/2013   Multifocal disease: pT1c,m, N0(isolated tumor cells) Er/ PR positive, Her 2 negative.   Cancer ([place]) 01/09/2014   Invasive lobular carcinoma, 2.2 cm, T2,N1 ER/PR positive, HER-2/neu negative   Cataract 2014?   Centrilobular emphysema ([place]) 76/16/0737   Complication of anesthesia 03/06/2022   Prolonged metabolism of Rocuronium during case.   Diabetes mellitus without complication ([place])    Diffuse cystic mastopathy    Erosive esophagitis    Essential hypertension, benign 09/24/2017   Family history of malignant neoplasm of gastrointestinal tract 2012   Fractured elbow 08/28/2014   Gastroesophageal reflux disease    Hx of metabolic acidosis with increased anion gap    Increased heart rate    Multiple sclerosis (HCC)    Neuromuscular disorder (HCC)    neuropathy in bil feet - left is worse.   Obesity, unspecified    Peripheral neuropathy    Personal history of tobacco use, presenting hazards  to health    Restless leg syndrome    Sleep apnea    uses CPAP   Special screening for malignant neoplasms, colon    Squamous cell carcinoma of skin 08/24/2022   SCCIS - scalp, ED&C    HEMATOLOGY/ONCOLOGY HISTORY:  Oncology History Overview Note  1. Bilateral carcinoma of breast, February, 2015. Right breast status post radical mastectomy. T1 C. N0M0 (isolated tumor cells in one lymph node) multifocal invasive cancer. Stage IC, Left breast, Status post radical mastectomy, T2, N1, M0 tumor. Stage II, RIght breast. Both tumors are estrogen receptor positive.  Progesterone receptor positive.  HER-2 receptor negative by FISH. Negative for BRCA mutation.  2. Started on Adriamycin and Cytoxan on March 01, 2014. Last cycle of Cytoxan and Adriamycin on May 01, 2014. Patient has finished last chemotherapy on September 9. (12 th dose was omitted because of neuropathy) 3.  Taking tamoxifen.  Patient had a poor tolerance to letrozole.(October, 2015) 4.  Patient is taking letrozole since November of 2015 5.  May, 2016 Letrozole has been put on hold because of bony pains 6.  Started on Aromasin from July of 2016    History of bilateral breast cancer (Resolved)  11/29/2013 Initial Diagnosis   Breast cancer bilateral, multifocal ER/PR pos, Her 2 neg,.     ALLERGIES:  has No Known Allergies.  MEDICATIONS:  Current Outpatient Medications  Medication Sig Dispense Refill   baclofen (LIORESAL) 10 MG tablet Take 1 tablet (10 mg total) by mouth 3 (three) times daily as needed for muscle spasms. 1080 tablet 0   calcium carbonate (OSCAL) 1500 (600 Ca) MG  TABS tablet Take by mouth 2 (two) times daily with a meal.     diclofenac Sodium (VOLTAREN) 1 % GEL Apply 2 g topically 3 (three) times daily as needed. 100 g 2   famotidine (PEPCID) 20 MG tablet TAKE 1 TABLET BY MOUTH TWICE A DAY BEFORE MEALS 180 tablet 1   fluticasone (FLONASE) 50 MCG/ACT nasal spray PLACE 2 SPRAYS INTO BOTH NOSTRILS AT BEDTIME. 48 g 1    fulvestrant (FASLODEX) 250 MG/5ML injection Inject 500 mg into the muscle every 30 (thirty) days. One injection each buttock over 1-2 minutes. Warm prior to use.     Krill Oil 1000 MG CAPS Take 1,000 mg by mouth daily.     lisinopril (ZESTRIL) 5 MG tablet TAKE 1 TABLET BY MOUTH EVERY DAY IN THE EVENING 90 tablet 3   metFORMIN (GLUCOPHAGE) 500 MG tablet TAKE 1 TABLET BY MOUTH 2 TIMES DAILY WITH A MEAL. 180 tablet 2   mometasone (ELOCON) 0.1 % cream Apply to affected skin qd-bid prn flares 45 g 2   prochlorperazine (COMPAZINE) 10 MG tablet Take 1 tablet (10 mg total) by mouth every 6 (six) hours as needed for nausea or vomiting. 20 tablet 1   ribociclib succ (KISQALI, 600 MG DOSE,) 200 MG Therapy Pack Take 3 tablets (600 mg total) by mouth daily. Take for 21 days on, 7 days off, repeat every 28 days. 63 tablet 5   sodium chloride (OCEAN) 0.65 % SOLN nasal spray Place 1 spray into both nostrils as needed for congestion.     traZODone (DESYREL) 50 MG tablet Take 1 tablet (50 mg total) by mouth at bedtime as needed. for sleep 90 tablet 3   venlafaxine XR (EFFEXOR-XR) 75 MG 24 hr capsule Take 1 capsule (75 mg total) by mouth daily with breakfast. 360 capsule 0   vitamin B-12 (CYANOCOBALAMIN) 1000 MCG tablet Take 2,500 mcg by mouth every Monday, Wednesday, and Friday.     Vitamin D, Cholecalciferol, 50 MCG (2000 UT) CAPS Take 2,000 Units by mouth every Monday, Wednesday, and Friday.     acetaminophen (TYLENOL) 500 MG tablet Take 1,000 mg by mouth 2 (two) times daily as needed for moderate pain or headache. (Patient not taking: Reported on 10/22/2022)     loperamide (IMODIUM A-D) 2 MG tablet Take 2 tablets (4 mg total) by mouth 4 (four) times daily as needed for diarrhea or loose stools. (Patient not taking: Reported on 09/24/2022) 30 tablet 0   omeprazole (PRILOSEC) 40 MG capsule TAKE 1 CAPSULE (40 MG TOTAL) BY MOUTH 2 (TWO) TIMES DAILY BEFORE A MEAL. 180 capsule 1   No current facility-administered  medications for this visit.    VITAL SIGNS: BP (!) 140/73 (BP Location: Left Arm, Patient Position: Sitting, Cuff Size: Normal)   Pulse (!) 102   Temp 97.7 F (36.5 C) (Tympanic)   Resp 18   Ht 5' 4" (1.626 m)   Wt 84.8 kg (187 lb)   LMP 12/22/1999 (Approximate)   SpO2 100%   BMI 32.10 kg/m  Filed Weights   11/19/22 1005  Weight: 84.8 kg (187 lb)     Estimated body mass index is 32.1 kg/m as calculated from the following:   Height as of this encounter: 5' 4" (1.626 m).   Weight as of this encounter: 84.8 kg (187 lb).  LABS: CBC:    Component Value Date/Time   WBC 4.3 11/19/2022 0937   HGB 9.1 (L) 11/19/2022 0937   HGB 15.5 07/13/2017 0937     HCT 26.1 (L) 11/19/2022 0937   HCT 46.7 (H) 07/13/2017 0937   PLT 185 11/19/2022 0937   PLT 211 07/13/2017 0937   MCV 105.2 (H) 11/19/2022 0937   MCV 94 07/13/2017 0937   MCV 93 03/11/2015 1356   NEUTROABS 1.9 11/19/2022 0937   NEUTROABS 5.4 07/13/2017 0937   NEUTROABS 5.6 03/11/2015 1356   LYMPHSABS 1.5 11/19/2022 0937   LYMPHSABS 2.1 07/13/2017 0937   LYMPHSABS 2.3 03/11/2015 1356   MONOABS 0.7 11/19/2022 0937   MONOABS 1.0 (H) 03/11/2015 1356   EOSABS 0.1 11/19/2022 0937   EOSABS 0.2 07/13/2017 0937   EOSABS 0.3 03/11/2015 1356   BASOSABS 0.1 11/19/2022 0937   BASOSABS 0.0 07/13/2017 0937   BASOSABS 0.1 03/11/2015 1356   Comprehensive Metabolic Panel:    Component Value Date/Time   NA 143 11/19/2022 0937   NA 142 07/14/2019 1516   NA 139 03/11/2015 1356   K 3.8 11/19/2022 0937   K 3.4 (L) 03/11/2015 1356   CL 108 11/19/2022 0937   CL 103 03/11/2015 1356   CO2 25 11/19/2022 0937   CO2 28 03/11/2015 1356   BUN 20 11/19/2022 0937   BUN 12 07/14/2019 1516   BUN 13 03/11/2015 1356   CREATININE 1.54 (H) 11/19/2022 0937   CREATININE 0.85 10/20/2021 0838   GLUCOSE 113 (H) 11/19/2022 0937   GLUCOSE 118 (H) 03/11/2015 1356   CALCIUM 9.2 11/19/2022 0937   CALCIUM 9.2 03/11/2015 1356   AST 26 11/19/2022 0937   AST  28 03/11/2015 1356   ALT 16 11/19/2022 0937   ALT 32 03/11/2015 1356   ALKPHOS 60 11/19/2022 0937   ALKPHOS 68 03/11/2015 1356   BILITOT 0.4 11/19/2022 0937   BILITOT 0.4 07/14/2019 1516   BILITOT 0.7 03/11/2015 1356   PROT 7.3 11/19/2022 0937   PROT 6.9 07/14/2019 1516   PROT 6.9 03/11/2015 1356   ALBUMIN 4.0 11/19/2022 0937   ALBUMIN 4.5 07/14/2019 1516   ALBUMIN 4.2 03/11/2015 1356     Present during today's visit: patient only  Assessment and Plan: CBC and CMP assessed with patient, continue ribociclib 600 mg 21 days on/7 days off. (Patient is still awaiting medication delivery) Patient will received her fulvestrant today  Other:  Patient did report that she had two falls recently, one going up the stairs and one going down the stairs. She thinks feels like her knees gave out on her and caused the falls. She did not need to seek urgent care for the falls, she reports only bruising.   Oral Chemotherapy Side Effect/Intolerance:  No issues with diarrhea, fatigue, or nausea/vomiting reported  Oral Chemotherapy Adherence: no missed dose reported No patient barriers to medication adherence identified.   New medications: none reported  Medication Access Issues:  Patient has not yet received her medication shipment and it is past her two promised delivery. Called and spoke the [employer] Patient assistance program and they do not see that a med was shipped. They are reprocessing this order, but it may be 11/25/22 before Ms. Bosch receives her medication. Called MS. Pitt to let her know this information after her appt. She has been approved for 2024 patient assistance  Patient expressed understanding and was in agreement with this plan. She also understands that She can call clinic at any time with any questions, concerns, or complaints.   Follow-up plan: RTC in 4 weeks (pending ribociclib delivery/start)  Thank you for allowing me to participate in the care of this very pleasant    patient.   Time Total: 15 min  Visit consisted of counseling and education on dealing with issues of symptom management in the setting of serious and potentially life-threatening illness.Greater than 50%  of this time was spent counseling and coordinating care related to the above assessment and plan.  Signed by: Alyson N. Leonard, PharmD, BCPS, BCOP, CPP Hematology/Oncology Clinical Pharmacist Practitioner [facility]/AP Oral Chemotherapy [facility] [phone]  11/19/2022 1:25 PM  

## 2022-11-20 LAB — CANCER ANTIGEN 27.29: CA 27.29: 81.1 U/mL — ABNORMAL HIGH (ref 0.0–38.6)

## 2022-12-17 ENCOUNTER — Ambulatory Visit: Payer: Medicare Other

## 2022-12-17 ENCOUNTER — Other Ambulatory Visit: Payer: Medicare Other

## 2022-12-17 ENCOUNTER — Ambulatory Visit: Payer: Medicare Other | Admitting: Oncology

## 2022-12-23 NOTE — Progress Notes (Signed)
NEUROLOGY FOLLOW UP OFFICE NOTE  Cassidy Norris [id number]  Assessment/Plan:   Multiple sclerosis, stable Gait instability - multifactorial related to underlying neuropathy, vestibulopathy and arthritis.  Subjective memory deficits - no evidence of cognitive impairment thus far.  Likely related to anxiety   1 D3 2000 IU three days weekly 2 Baclofen '10mg'$  TID as needed muscle cramps 3 monitor memory 4 Follow up one year  Subjective:  Cassidy Norris is a 72 year old right-handed woman with multiple sclerosis, breast cancer, type 2 diabetes, former smoker and s/p L4-5 laminectomy who follows up for multiple sclerosis   UPDATE: Current disease modifying therapy:  none Other medications:  baclofen '10mg'$  twice to three times daily; D3 2000 IU three times a week    Vision:  No change Motor:  No issues Sensory:  Neuropathy in feet, stable Pain:  Still having muscle cramps and neuropathic pain in legs at night.  When driving, feels cramp in hand.  Both knees replaced.   Gait:  unsteady.  Feels they are getting worse.   Bowel/Bladder:  no Fatigue:  No.  Still having trouble sleeping despite trazodone.   Cognition:  Feels short term memory is worse.  Forgets what she has to do.  Sometimes disoriented while driving but usually happens when she is talking to somebody else Mood:  Anxiety Directional vertigo to the left.   HISTORY: In November 2001, she tripped and fell, hitting her head.  She went to the ED where a CT of the head was reportedly unremarkable.  She was advised to have an MRI of the brain, which was performed on 12/12/00.  It revealed small round and elliptical white matter lesions less than 1 cm in the periventricular region.  In December of 2002, she followed up with a neurologist, Dr. Corinna Capra.  VEP performed in 2003 showed prolonged p100 latency in the right optic nerve.  BAER was normal.  She reportedly had a lumbar puncture where CSF results were supportive of MS.  She  was given 5 days of Solu-Medrol followed by 3 weeks of oral prednisone.  She was advised to start an interferon, but due to possible side effects, she decided to hold off and see if she clinically progresses.  She did have a follow up MRI of the brain in December 2004, which reportedly showed an enhancing lesion as well as a remote lesion in the cerebellum in addition to the periventricular white matter lesions.  She never had any noticeable clinical flare-ups and had done well off of a disease-modifying agent.  Over the past few years, she notes possible worsening in depth perception.  Sometimes she notes difficulty grabbing for objects or will drive too close to the curb.   Imaging: 03/12/2015 MRI BRAIN & CERVICAL SPINE W WO: non-enhancing bilateral periventricular white matter lesions oriented perpendicular to the ventricles and corpus callosum, as well as involving temporal lobe white matter and mild involvement of the left side of cerebellum.  No cervical cord lesions.  01/28/2017 MRI BRAIN W WO:  "two small new or increased right hemisphere subcortical white matter lesions when compared to 2016" with no enhancement or active demyelination.  Otherwise, MRI is stable.   01/25/2018 MRI BRAIN W WO:  stable chronic white matter hyperintensities consistent with MS.  08/10/2019 MRI BRAIN W WO:  Multiple T2/FLAIR hyperintense foci without enhancement, stable compared to imaging from 03/27/2018. 10/01/2020 MRI BRAIN W WO:  stable compared to imaging from 08/10/2019 12/14/2021 MRI BRAIN W WO:  stable chronic demyelination with no new or active lesions.     In 2015, she was diagnosed with bilateral breast cancer.  She underwent bilateral mastectomy and chemotherapy, which ended in September.  As a result of the chemotherapy, she has neuropathy in the feet.  She also had problems with her left leg and was found to have a ruptured disc at L4-5 and underwent surgery in October 2015.  She has baseline numbness over  the dorsum of left foot and lateral leg.  The toes in her left foot sometimes curl.  Since chemotherapy, she notes some mild memory problems.  She sometimes misplaces objects or will forget if she locked the front door when she left.  She will sometimes have difficulty getting a word out.   Past DMT:  Tecfidera (stopped in early 2020.  She didn't feel well on it - slurred speech, worse balance.  Also, she was tired with fighting with the insurance company to get it)  PAST MEDICAL HISTORY: Past Medical History:  Diagnosis Date   Anemia    Arthritis not really diagnosis but have pain   B12 deficiency 07/23/2016   Will check levels to determine if injections are needed again. Await results. Recent CBC at Onc WNL   Blood transfusion without reported diagnosis 1983 had a miscarriage/dnc   Cancer ([place]) 12/30/2013   Multifocal disease: pT1c,m, N0(isolated tumor cells) Er/ PR positive, Her 2 negative.   Cancer ([place]) 01/09/2014   Invasive lobular carcinoma, 2.2 cm, T2,N1 ER/PR positive, HER-2/neu negative   Cataract 2014?   Centrilobular emphysema ([place]) 04/54/0981   Complication of anesthesia 03/06/2022   Prolonged metabolism of Rocuronium during case.   Diabetes mellitus without complication ([place])    Diffuse cystic mastopathy    Erosive esophagitis    Essential hypertension, benign 09/24/2017   Family history of malignant neoplasm of gastrointestinal tract 2012   Fractured elbow 08/28/2014   Gastroesophageal reflux disease    Hx of metabolic acidosis with increased anion gap    Increased heart rate    Multiple sclerosis (HCC)    Neuromuscular disorder (HCC)    neuropathy in bil feet - left is worse.   Obesity, unspecified    Peripheral neuropathy    Personal history of tobacco use, presenting hazards to health    Restless leg syndrome    Sleep apnea    uses CPAP   Special screening for malignant neoplasms, colon    Squamous cell carcinoma of skin 08/24/2022   SCCIS - scalp, ED&C     MEDICATIONS: Current Outpatient Medications on File Prior to Visit  Medication Sig Dispense Refill   acetaminophen (TYLENOL) 500 MG tablet Take 1,000 mg by mouth 2 (two) times daily as needed for moderate pain or headache. (Patient not taking: Reported on 10/22/2022)     baclofen (LIORESAL) 10 MG tablet Take 1 tablet (10 mg total) by mouth 3 (three) times daily as needed for muscle spasms. 1080 tablet 0   calcium carbonate (OSCAL) 1500 (600 Ca) MG TABS tablet Take by mouth 2 (two) times daily with a meal.     diclofenac Sodium (VOLTAREN) 1 % GEL Apply 2 g topically 3 (three) times daily as needed. 100 g 2   famotidine (PEPCID) 20 MG tablet TAKE 1 TABLET BY MOUTH TWICE A DAY BEFORE MEALS 180 tablet 1   fluticasone (FLONASE) 50 MCG/ACT nasal spray PLACE 2 SPRAYS INTO BOTH NOSTRILS AT BEDTIME. 48 g 1   fulvestrant (FASLODEX) 250 MG/5ML injection Inject 500 mg  into the muscle every 30 (thirty) days. One injection each buttock over 1-2 minutes. Warm prior to use.     Krill Oil 1000 MG CAPS Take 1,000 mg by mouth daily.     lisinopril (ZESTRIL) 5 MG tablet TAKE 1 TABLET BY MOUTH EVERY DAY IN THE EVENING 90 tablet 3   loperamide (IMODIUM A-D) 2 MG tablet Take 2 tablets (4 mg total) by mouth 4 (four) times daily as needed for diarrhea or loose stools. (Patient not taking: Reported on 09/24/2022) 30 tablet 0   metFORMIN (GLUCOPHAGE) 500 MG tablet TAKE 1 TABLET BY MOUTH 2 TIMES DAILY WITH A MEAL. 180 tablet 2   mometasone (ELOCON) 0.1 % cream Apply to affected skin qd-bid prn flares 45 g 2   omeprazole (PRILOSEC) 40 MG capsule TAKE 1 CAPSULE (40 MG TOTAL) BY MOUTH 2 (TWO) TIMES DAILY BEFORE A MEAL. 180 capsule 1   prochlorperazine (COMPAZINE) 10 MG tablet Take 1 tablet (10 mg total) by mouth every 6 (six) hours as needed for nausea or vomiting. 20 tablet 1   ribociclib succ (KISQALI, 600 MG DOSE,) 200 MG Therapy Pack Take 3 tablets (600 mg total) by mouth daily. Take for 21 days on, 7 days off, repeat  every 28 days. 63 tablet 5   sodium chloride (OCEAN) 0.65 % SOLN nasal spray Place 1 spray into both nostrils as needed for congestion.     traZODone (DESYREL) 50 MG tablet Take 1 tablet (50 mg total) by mouth at bedtime as needed. for sleep 90 tablet 3   venlafaxine XR (EFFEXOR-XR) 75 MG 24 hr capsule Take 1 capsule (75 mg total) by mouth daily with breakfast. 360 capsule 0   vitamin B-12 (CYANOCOBALAMIN) 1000 MCG tablet Take 2,500 mcg by mouth every Monday, Wednesday, and Friday.     Vitamin D, Cholecalciferol, 50 MCG (2000 UT) CAPS Take 2,000 Units by mouth every Monday, Wednesday, and Friday.     No current facility-administered medications on file prior to visit.    ALLERGIES: No Known Allergies  FAMILY HISTORY: Family History  Problem Relation Age of Onset   Cancer Mother        lung   COPD Mother    Cancer Father        colon, stomach   Diabetes Maternal Grandmother    Hypertension Sister    Rectal cancer Paternal Uncle    Rectal cancer Paternal Uncle       Objective:  Blood pressure 127/79, pulse 100, resp. rate 20, height 5' 4.5" (1.638 m), weight 202 lb (91.6 kg), last menstrual period 12/22/1999, SpO2 98 %. General: No acute distress.  Patient appears well-groomed.   Head:  Normocephalic/atraumatic Eyes:  Fundi examined but not visualized Neck: supple, no paraspinal tenderness, full range of motion Heart:  Regular rate and rhythm Lungs:  Clear to auscultation bilaterally Back: No paraspinal tenderness Neurological Exam: alert and oriented to person, place, and time.  Speech fluent and not dysarthric, language intact.      12/25/2022   10:00 AM  [employer] Mental Exam  Weekday Correct 1  Current year 1  What state are we in? 1  Amount spent 1  Amount left 0  # of Animals 3  5 objects recall 4  Number series 2  Hour markers 2  Time correct 2  Placed X in triangle correctly 1  Largest Figure 1  Name of female 2  Date back to work 2  Type of work  2  [employer]  she lived in 2  Total score 27   CN II-XII intact. Bulk and tone normal, muscle strength 5/5 throughout.  Sensation to pinprick reduced in left foot, reduced to vibration in both feet..  Deep tendon reflexes trace throughout.  Finger to nose testing intact.  Overall steady.  Able to turn.  Some mild unsteadiness with tandem walk.  Romberg negative.  Metta Clines, DO  CC: Nobie Putnam, DO

## 2022-12-24 ENCOUNTER — Encounter: Payer: Self-pay | Admitting: Oncology

## 2022-12-24 ENCOUNTER — Inpatient Hospital Stay: Payer: Medicare Other

## 2022-12-24 ENCOUNTER — Inpatient Hospital Stay: Payer: Medicare Other | Attending: Oncology | Admitting: Oncology

## 2022-12-24 VITALS — BP 141/79 | HR 102 | Temp 97.5°F | Resp 16 | Ht 64.0 in | Wt 184.0 lb

## 2022-12-24 DIAGNOSIS — Z170 Estrogen receptor positive status [ER+]: Secondary | ICD-10-CM | POA: Insufficient documentation

## 2022-12-24 DIAGNOSIS — Z79899 Other long term (current) drug therapy: Secondary | ICD-10-CM | POA: Insufficient documentation

## 2022-12-24 DIAGNOSIS — C50919 Malignant neoplasm of unspecified site of unspecified female breast: Secondary | ICD-10-CM

## 2022-12-24 DIAGNOSIS — C50912 Malignant neoplasm of unspecified site of left female breast: Secondary | ICD-10-CM | POA: Insufficient documentation

## 2022-12-24 DIAGNOSIS — C50911 Malignant neoplasm of unspecified site of right female breast: Secondary | ICD-10-CM | POA: Diagnosis not present

## 2022-12-24 DIAGNOSIS — Z5111 Encounter for antineoplastic chemotherapy: Secondary | ICD-10-CM | POA: Insufficient documentation

## 2022-12-24 LAB — CBC WITH DIFFERENTIAL/PLATELET
Abs Immature Granulocytes: 0.01 10*3/uL (ref 0.00–0.07)
Basophils Absolute: 0.1 10*3/uL (ref 0.0–0.1)
Basophils Relative: 2 %
Eosinophils Absolute: 0.1 10*3/uL (ref 0.0–0.5)
Eosinophils Relative: 2 %
HCT: 27.3 % — ABNORMAL LOW (ref 36.0–46.0)
Hemoglobin: 9.3 g/dL — ABNORMAL LOW (ref 12.0–15.0)
Immature Granulocytes: 0 %
Lymphocytes Relative: 36 %
Lymphs Abs: 1.6 10*3/uL (ref 0.7–4.0)
MCH: 36.2 pg — ABNORMAL HIGH (ref 26.0–34.0)
MCHC: 34.1 g/dL (ref 30.0–36.0)
MCV: 106.2 fL — ABNORMAL HIGH (ref 80.0–100.0)
Monocytes Absolute: 0.6 10*3/uL (ref 0.1–1.0)
Monocytes Relative: 14 %
Neutro Abs: 2 10*3/uL (ref 1.7–7.7)
Neutrophils Relative %: 46 %
Platelets: 206 10*3/uL (ref 150–400)
RBC: 2.57 MIL/uL — ABNORMAL LOW (ref 3.87–5.11)
RDW: 14.8 % (ref 11.5–15.5)
WBC: 4.4 10*3/uL (ref 4.0–10.5)
nRBC: 0 % (ref 0.0–0.2)

## 2022-12-24 LAB — COMPREHENSIVE METABOLIC PANEL
ALT: 15 U/L (ref 0–44)
AST: 30 U/L (ref 15–41)
Albumin: 4.3 g/dL (ref 3.5–5.0)
Alkaline Phosphatase: 68 U/L (ref 38–126)
Anion gap: 13 (ref 5–15)
BUN: 21 mg/dL (ref 8–23)
CO2: 27 mmol/L (ref 22–32)
Calcium: 9 mg/dL (ref 8.9–10.3)
Chloride: 103 mmol/L (ref 98–111)
Creatinine, Ser: 1.64 mg/dL — ABNORMAL HIGH (ref 0.44–1.00)
GFR, Estimated: 33 mL/min — ABNORMAL LOW (ref >60)
Glucose, Bld: 116 mg/dL — ABNORMAL HIGH (ref 70–99)
Potassium: 3.7 mmol/L (ref 3.5–5.1)
Sodium: 143 mmol/L (ref 135–145)
Total Bilirubin: 0.4 mg/dL (ref 0.3–1.2)
Total Protein: 7.8 g/dL (ref 6.5–8.1)

## 2022-12-24 LAB — MAGNESIUM: Magnesium: 1.8 mg/dL (ref 1.7–2.4)

## 2022-12-24 MED ORDER — FULVESTRANT 250 MG/5ML IM SOSY
500.0000 mg | PREFILLED_SYRINGE | Freq: Once | INTRAMUSCULAR | Status: AC
  Administered 2022-12-24: 500 mg via INTRAMUSCULAR
  Filled 2022-12-24: qty 10

## 2022-12-24 NOTE — Progress Notes (Signed)
Wanted to make mention that she did miss a Kisqali because she lost the pill.

## 2022-12-24 NOTE — Progress Notes (Signed)
[place]  [phone]  [phone]     Cassidy Norris DOB: 1951/09/16  MR#: [id number]  ZHG#:[id number]  Patient Care Team: [name], DO as PCP - General (Family Medicine) Pieter Partridge, DO as Consulting Physician (Neurology) Lloyd Huger, MD as Consulting Physician (Oncology)  CHIEF COMPLAINT: Recurrent, metastatic ER/PR, HER2 negative invasive carcinoma of the breast.    INTERVAL HISTORY: Patient returns to clinic today for repeat laboratory work, further evaluation, and continuation of Kisqali and fulvestrant.  She currently feels well and is asymptomatic.  She continues to tolerate her treatments well without significant side effects.  She has no neurologic complaints.  She denies any recent fevers or illnesses. She has a good appetite and denies weight loss.  She has no chest pain, shortness of breath, cough, or hemoptysis.  She denies any nausea, vomiting, or constipation. She has no urinary complaints.  Patient offers no specific complaints today.  REVIEW OF SYSTEMS:   Review of Systems  Constitutional: Negative.  Negative for fever, malaise/fatigue and weight loss.  Respiratory: Negative.  Negative for cough, hemoptysis and shortness of breath.   Cardiovascular: Negative.  Negative for chest pain and leg swelling.  Gastrointestinal: Negative.  Negative for abdominal pain.  Genitourinary: Negative.  Negative for dysuria.  Musculoskeletal: Negative.  Negative for joint pain and myalgias.  Skin: Negative.  Negative for rash.  Neurological: Negative.  Negative for dizziness, sensory change, focal weakness, weakness and headaches.  Psychiatric/Behavioral: Negative.  The patient is not nervous/anxious.     As per HPI. Otherwise, a complete review of systems is negative.  ONCOLOGY HISTORY: Oncology History Overview Note  1. Bilateral carcinoma of breast, February, 2015. Right breast status post radical mastectomy. T1 C.  N0M0 (isolated tumor cells in one lymph node) multifocal invasive cancer. Stage IC, Left breast, Status post radical mastectomy, T2, N1, M0 tumor. Stage II, RIght breast. Both tumors are estrogen receptor positive.  Progesterone receptor positive.  HER-2 receptor negative by FISH. Negative for BRCA mutation.  2. Started on Adriamycin and Cytoxan on March 01, 2014. Last cycle of Cytoxan and Adriamycin on May 01, 2014. Patient has finished last chemotherapy on September 9. (12 th dose was omitted because of neuropathy) 3.  Taking tamoxifen.  Patient had a poor tolerance to letrozole.(October, 2015) 4.  Patient is taking letrozole since November of 2015 5.  May, 2016 Letrozole has been put on hold because of bony pains 6.  Started on Aromasin from July of 2016    History of bilateral breast cancer (Resolved)  11/29/2013 Initial Diagnosis   Breast cancer bilateral, multifocal ER/PR pos, Her 2 neg,.     PAST MEDICAL HISTORY: Past Medical History:  Diagnosis Date   Anemia    Arthritis not really diagnosis but have pain   B12 deficiency 07/23/2016   Will check levels to determine if injections are needed again. Await results. Recent CBC at Onc WNL   Blood transfusion without reported diagnosis 1983 had a miscarriage/dnc   Cancer ([place]) 12/30/2013   Multifocal disease: pT1c,m, N0(isolated tumor cells) Er/ PR positive, Her 2 negative.   Cancer ([place]) 01/09/2014   Invasive lobular carcinoma, 2.2 cm, T2,N1 ER/PR positive, HER-2/neu negative   Cataract 2014?   Centrilobular emphysema ([place]) 19/62/2297   Complication of anesthesia 03/06/2022   Prolonged metabolism of Rocuronium during case.   Diabetes mellitus without complication ([place])    Diffuse cystic mastopathy    Erosive esophagitis    Essential hypertension,  benign 09/24/2017   Family history of malignant neoplasm of gastrointestinal tract 2012   Fractured elbow 08/28/2014   Gastroesophageal reflux disease    Hx of metabolic acidosis  with increased anion gap    Increased heart rate    Multiple sclerosis (HCC)    Neuromuscular disorder (HCC)    neuropathy in bil feet - left is worse.   Obesity, unspecified    Peripheral neuropathy    Personal history of tobacco use, presenting hazards to health    Restless leg syndrome    Sleep apnea    uses CPAP   Special screening for malignant neoplasms, colon    Squamous cell carcinoma of skin 08/24/2022   SCCIS - scalp, ED&C    PAST SURGICAL HISTORY: Past Surgical History:  Procedure Laterality Date   BREAST BIOPSY Right 1973,2001   BREAST BIOPSY Right 11/07/2013   INVASIVE MAMMARY CARCINOMA , ER/PR positive Her2 negative   BREAST BIOPSY Left 11/27/2013   invasive lobular and DCIS   BREAST SURGERY Bilateral 01/09/2014   mastectomy   cataract surgery Left 08/13/2022   CHOLECYSTECTOMY     COLONOSCOPY  2010   Dr. Jamal Collin   COLONOSCOPY WITH PROPOFOL N/A 06/11/2015   Procedure: COLONOSCOPY WITH PROPOFOL;  Surgeon: Christene Lye, MD;  Location: [facility] ENDOSCOPY;  Service: Endoscopy;  Laterality: N/A;   COLONOSCOPY WITH PROPOFOL N/A 12/23/2021   Procedure: COLONOSCOPY WITH PROPOFOL;  Surgeon: Lin Landsman, MD;  Location: [facility] ENDOSCOPY;  Service: Gastroenterology;  Laterality: N/A;   DILATATION & CURETTAGE/HYSTEROSCOPY WITH MYOSURE N/A 07/12/2018   Procedure: DILATATION & CURETTAGE/HYSTEROSCOPY WITH MYOSURE WITH ENDOMETRIAL POLYPECTOMY;  Surgeon: Will Bonnet, MD;  Location: [facility] ORS;  Service: Gynecology;  Laterality: N/A;   DILATION AND CURETTAGE OF UTERUS  1983   ESOPHAGOGASTRODUODENOSCOPY N/A 12/23/2021   Procedure: ESOPHAGOGASTRODUODENOSCOPY (EGD);  Surgeon: Lin Landsman, MD;  Location: [facility] ENDOSCOPY;  Service: Gastroenterology;  Laterality: N/A;   JOINT REPLACEMENT  11/23/2015   KNEE ARTHROPLASTY Right 03/06/2022   Procedure: COMPUTER ASSISTED TOTAL KNEE ARTHROPLASTY;  Surgeon: Dereck Leep, MD;  Location: [facility] ORS;  Service: Orthopedics;   Laterality: Right;   POLYPECTOMY  1989   PORTA CATH REMOVAL     PORTACATH PLACEMENT  2015   SKIN BIOPSY     on scalp   SPINE SURGERY  09/14/2014   Ruptured disk L4- L5   TOTAL KNEE ARTHROPLASTY Left 11/22/2015   Procedure: TOTAL KNEE ARTHROPLASTY;  Surgeon: Earlie Server, MD;  Location: [place] [place] [facility];  Service: Orthopedics;  Laterality: Left;   TUBAL LIGATION     WISDOM TOOTH EXTRACTION  2006    FAMILY HISTORY Family History  Problem Relation Age of Onset   Cancer Mother        lung   COPD Mother    Cancer Father        colon, stomach   Diabetes Maternal Grandmother    Hypertension Sister    Rectal cancer Paternal Uncle    Rectal cancer Paternal Uncle     GYNECOLOGIC HISTORY:  Patient's last menstrual period was 12/22/1999 (approximate).     ADVANCED DIRECTIVES:    HEALTH MAINTENANCE: Social History   Tobacco Use   Smoking status: Former    Packs/day: 1.00    Years: 30.00    Total pack years: 30.00    Types: Cigarettes    Quit date: 12/11/2013    Years since quitting: 9.0   Smokeless tobacco: Former  Scientific laboratory technician Use: Never used  Substance Use Topics   Alcohol use: Not Currently   Drug use: No     Colonoscopy:  PAP:  Bone density:  Mammogram:  No Known Allergies  Current Outpatient Medications  Medication Sig Dispense Refill   baclofen (LIORESAL) 10 MG tablet Take 1 tablet (10 mg total) by mouth 3 (three) times daily as needed for muscle spasms. 1080 tablet 0   calcium carbonate (OSCAL) 1500 (600 Ca) MG TABS tablet Take by mouth 2 (two) times daily with a meal.     diclofenac Sodium (VOLTAREN) 1 % GEL Apply 2 g topically 3 (three) times daily as needed. 100 g 2   famotidine (PEPCID) 20 MG tablet TAKE 1 TABLET BY MOUTH TWICE A DAY BEFORE MEALS 180 tablet 1   fluticasone (FLONASE) 50 MCG/ACT nasal spray PLACE 2 SPRAYS INTO BOTH NOSTRILS AT BEDTIME. 48 g 1   fulvestrant (FASLODEX) 250 MG/5ML injection Inject 500 mg into the muscle every 30 (thirty) days.  One injection each buttock over 1-2 minutes. Warm prior to use.     Krill Oil 1000 MG CAPS Take 1,000 mg by mouth daily.     lisinopril (ZESTRIL) 5 MG tablet TAKE 1 TABLET BY MOUTH EVERY DAY IN THE EVENING 90 tablet 3   metFORMIN (GLUCOPHAGE) 500 MG tablet TAKE 1 TABLET BY MOUTH 2 TIMES DAILY WITH A MEAL. 180 tablet 2   mometasone (ELOCON) 0.1 % cream Apply to affected skin qd-bid prn flares 45 g 2   omeprazole (PRILOSEC) 40 MG capsule TAKE 1 CAPSULE (40 MG TOTAL) BY MOUTH 2 (TWO) TIMES DAILY BEFORE A MEAL. 180 capsule 1   prochlorperazine (COMPAZINE) 10 MG tablet Take 1 tablet (10 mg total) by mouth every 6 (six) hours as needed for nausea or vomiting. 20 tablet 1   ribociclib succ (KISQALI, 600 MG DOSE,) 200 MG Therapy Pack Take 3 tablets (600 mg total) by mouth daily. Take for 21 days on, 7 days off, repeat every 28 days. 63 tablet 5   sodium chloride (OCEAN) 0.65 % SOLN nasal spray Place 1 spray into both nostrils as needed for congestion.     traZODone (DESYREL) 50 MG tablet Take 1 tablet (50 mg total) by mouth at bedtime as needed. for sleep 90 tablet 3   venlafaxine XR (EFFEXOR-XR) 75 MG 24 hr capsule Take 1 capsule (75 mg total) by mouth daily with breakfast. 360 capsule 0   vitamin B-12 (CYANOCOBALAMIN) 1000 MCG tablet Take 2,500 mcg by mouth every Monday, Wednesday, and Friday.     Vitamin D, Cholecalciferol, 50 MCG (2000 UT) CAPS Take 2,000 Units by mouth every Monday, Wednesday, and Friday.     acetaminophen (TYLENOL) 500 MG tablet Take 1,000 mg by mouth 2 (two) times daily as needed for moderate pain or headache. (Patient not taking: Reported on 10/22/2022)     loperamide (IMODIUM A-D) 2 MG tablet Take 2 tablets (4 mg total) by mouth 4 (four) times daily as needed for diarrhea or loose stools. (Patient not taking: Reported on 09/24/2022) 30 tablet 0   No current facility-administered medications for this visit.   Facility-Administered Medications Ordered in Other Visits  Medication  Dose Route Frequency Provider Last Rate Last Admin   fulvestrant (FASLODEX) injection 500 mg  500 mg Intramuscular Once Lloyd Huger, MD        OBJECTIVE: BP (!) 141/79 (BP Location: Right Arm, Patient Position: Sitting, Cuff Size: Normal)   Pulse (!) 102   Temp (!) 97.5 F (36.4 C) (Tympanic)  Resp 16   Ht '5\' 4"'$  (1.626 m)   Wt 184 lb (83.5 kg)   LMP 12/22/1999 (Approximate)   SpO2 100%   BMI 31.58 kg/m    Body mass index is 31.58 kg/m.    ECOG FS:0 - Asymptomatic  General: Well-developed, well-nourished, no acute distress. Eyes: Pink conjunctiva, anicteric sclera. HEENT: Normocephalic, moist mucous membranes. Lungs: No audible wheezing or coughing. Heart: Regular rate and rhythm. Abdomen: Soft, nontender, no obvious distention. Musculoskeletal: No edema, cyanosis, or clubbing. Neuro: Alert, answering all questions appropriately. Cranial nerves grossly intact. Skin: No rashes or petechiae noted. Psych: Normal affect.  LAB RESULTS:  Appointment on 12/24/2022  Component Date Value Ref Range Status   Magnesium 12/24/2022 1.8  1.7 - 2.4 mg/dL Final   Performed at [facility], [address] [place], [place] [address]   Sodium 12/24/2022 143  135 - 145 mmol/L Final   Potassium 12/24/2022 3.7  3.5 - 5.1 mmol/L Final   Chloride 12/24/2022 103  98 - 111 mmol/L Final   CO2 12/24/2022 27  22 - 32 mmol/L Final   Glucose, Bld 12/24/2022 116 (H)  70 - 99 mg/dL Final   Glucose reference range applies only to samples taken after fasting for at least 8 hours.   BUN 12/24/2022 21  8 - 23 mg/dL Final   Creatinine, Ser 12/24/2022 1.64 (H)  0.44 - 1.00 mg/dL Final   Calcium 12/24/2022 9.0  8.9 - 10.3 mg/dL Final   Total Protein 12/24/2022 7.8  6.5 - 8.1 g/dL Final   Albumin 12/24/2022 4.3  3.5 - 5.0 g/dL Final   AST 12/24/2022 30  15 - 41 U/L Final   ALT 12/24/2022 15  0 - 44 U/L Final   Alkaline Phosphatase 12/24/2022 68  38 - 126 U/L Final   Total Bilirubin 12/24/2022  0.4  0.3 - 1.2 mg/dL Final   GFR, Estimated 12/24/2022 33 (L)  >60 mL/min Final   Comment: (NOTE) Calculated using the CKD-EPI Creatinine Equation (2021)    Anion gap 12/24/2022 13  5 - 15 Final   Performed at [facility], [place], [place] [address]   WBC 12/24/2022 4.4  4.0 - 10.5 K/uL Final   RBC 12/24/2022 2.57 (L)  3.87 - 5.11 MIL/uL Final   Hemoglobin 12/24/2022 9.3 (L)  12.0 - 15.0 g/dL Final   HCT 12/24/2022 27.3 (L)  36.0 - 46.0 % Final   MCV 12/24/2022 106.2 (H)  80.0 - 100.0 fL Final   MCH 12/24/2022 36.2 (H)  26.0 - 34.0 pg Final   MCHC 12/24/2022 34.1  30.0 - 36.0 g/dL Final   RDW 12/24/2022 14.8  11.5 - 15.5 % Final   Platelets 12/24/2022 206  150 - 400 K/uL Final   nRBC 12/24/2022 0.0  0.0 - 0.2 % Final   Neutrophils Relative % 12/24/2022 46  % Final   Neutro Abs 12/24/2022 2.0  1.7 - 7.7 K/uL Final   Lymphocytes Relative 12/24/2022 36  % Final   Lymphs Abs 12/24/2022 1.6  0.7 - 4.0 K/uL Final   Monocytes Relative 12/24/2022 14  % Final   Monocytes Absolute 12/24/2022 0.6  0.1 - 1.0 K/uL Final   Eosinophils Relative 12/24/2022 2  % Final   Eosinophils Absolute 12/24/2022 0.1  0.0 - 0.5 K/uL Final   Basophils Relative 12/24/2022 2  % Final   Basophils Absolute 12/24/2022 0.1  0.0 - 0.1 K/uL Final   Immature Granulocytes 12/24/2022 0  %  Final   Abs Immature Granulocytes 12/24/2022 0.01  0.00 - 0.07 K/uL Final   Performed at [facility], [address] [place], [place] [address]    STUDIES: No results found.  ONCOLOGY HISTORY:  Bilateral adenocarcinoma of the breast. Patient is status post bilateral mastectomy in February 2015. She also received adjuvant chemotherapy with Adriamycin, Cytoxan, and Taxol completing treatment in approximately October 2015. She then initiated letrozole, but could not tolerate secondary to leg pain and was switched to Aromasin.  Patient then switched to tamoxifen in October 2019 secondary to cost.  Patient  subsequently discontinued tamoxifen and did not complete the recommended 5 years of treatment.   ASSESSMENT: Recurrent, metastatic ER/PR, HER2 negative invasive carcinoma of the breast.   PLAN:    1. Recurrent, metastatic ER/PR, HER2 negative invasive carcinoma of the breast: See oncology history as above.  Biopsy confirmed recurrent disease.  PET scan results from December 26, 2021 reviewed independently with hypermetabolic left supraclavicular, subpectoral, and axillary lymphadenopathy consistent with nodal recurrence of patient's history of breast cancer.  No distant metastases were identified.  Repeat PET scan on April 23, 2022 reviewed independently with significant improvement of patient's disease burden.  Her initial CA 27-29 initially was 260.1.  Now seems to have plateaued between 81.1 and 91.  Her most recent result is 81.1.  Continue Kisqali 600 mg daily for 21 days with 7 days off.  Proceed with fulvestrant today.  Return to clinic in 4 weeks for further evaluation and continuation of treatment with clinical pharmacist.  Patient will then return to clinic in 8 weeks for repeat laboratory work, further evaluation and continuation of treatment.   2.  Osteopenia: Patient's last bone mineral density was on August 01, 2016 and revealed a T-score of -1.2. Continue monitoring bone mineral density by PCP. 3.  Multiple sclerosis: Chronic and unchanged.  Continue evaluation and treatment per neurology.  4.  Esophageal discomfort/mild dysphagia: Patient does not complain of this today.  Recent colonoscopy and EGD did not reveal any significant pathology. 5.  Renal insufficiency: Chronic and unchanged.  Patient's GFR stable at 33.  Kisqali needs to be dose reduced if GFR falls below 30. 6.  Anemia: Chronic and unchanged.  Patient's hemoglobin is 9.3 today.  7.  Diarrhea: Patient does not complain of this today.  Continue Imodium as needed.   8.  Hypomagnesia: Resolved. 9.  Hypokalemia:  Resolved.  Patient expressed understanding and was in agreement with this plan. She also understands that She can call clinic at any time with any questions, concerns, or complaints.   Breast cancer bilateral, multifocal ER/PR pos, Her 2 neg,.   Staging form: Breast, AJCC 7th Edition     Clinical: Stage IIB (T2, N1, M0) - Signed by Forest Gleason, MD on 04/22/2015   Lloyd Huger, MD   12/24/2022 10:04 AM

## 2022-12-25 ENCOUNTER — Encounter: Payer: Self-pay | Admitting: Neurology

## 2022-12-25 ENCOUNTER — Ambulatory Visit (INDEPENDENT_AMBULATORY_CARE_PROVIDER_SITE_OTHER): Payer: Medicare Other | Admitting: Neurology

## 2022-12-25 VITALS — BP 162/74 | HR 78 | Resp 18 | Ht 65.0 in | Wt 185.0 lb

## 2022-12-25 DIAGNOSIS — G629 Polyneuropathy, unspecified: Secondary | ICD-10-CM

## 2022-12-25 DIAGNOSIS — R4189 Other symptoms and signs involving cognitive functions and awareness: Secondary | ICD-10-CM

## 2022-12-25 DIAGNOSIS — G35 Multiple sclerosis: Secondary | ICD-10-CM

## 2022-12-25 LAB — CANCER ANTIGEN 27.29: CA 27.29: 87.6 U/mL — ABNORMAL HIGH (ref 0.0–38.6)

## 2022-12-25 MED ORDER — BACLOFEN 10 MG PO TABS: 10.0000 mg | ORAL_TABLET | Freq: Three times a day (TID) | ORAL | 0 refills | Status: DC | PRN

## 2023-01-01 ENCOUNTER — Ambulatory Visit: Payer: Medicare Other | Admitting: Neurology

## 2023-01-15 ENCOUNTER — Other Ambulatory Visit: Payer: Self-pay | Admitting: Gastroenterology

## 2023-01-15 ENCOUNTER — Other Ambulatory Visit: Payer: Self-pay | Admitting: Family Medicine

## 2023-01-15 DIAGNOSIS — E1169 Type 2 diabetes mellitus with other specified complication: Secondary | ICD-10-CM

## 2023-01-15 DIAGNOSIS — R809 Proteinuria, unspecified: Secondary | ICD-10-CM

## 2023-01-15 DIAGNOSIS — K219 Gastro-esophageal reflux disease without esophagitis: Secondary | ICD-10-CM

## 2023-01-15 NOTE — Telephone Encounter (Signed)
Famotidine 08/31/22 #180 1RF- 6 month supply Lisinopril 03/02/22 #90 3RF- 1 year- too soon Requested Prescriptions  Pending Prescriptions Disp Refills   lisinopril (ZESTRIL) 5 MG tablet [Pharmacy Med Name: LISINOPRIL 5 MG TABLET] 90 tablet 3    Sig: TAKE 1 TABLET BY MOUTH EVERY DAY IN THE EVENING     Cardiovascular:  ACE Inhibitors Failed - 01/15/2023  1:33 PM      Failed - Cr in normal range and within 180 days    Creat  Date Value Ref Range Status  10/20/2021 0.85 0.50 - 1.05 mg/dL Final   Creatinine, Ser  Date Value Ref Range Status  12/24/2022 1.64 (H) 0.44 - 1.00 mg/dL Final   Creatinine, Urine  Date Value Ref Range Status  10/29/2022 131 20 - 275 mg/dL Final         Failed - Last BP in normal range    BP Readings from Last 1 Encounters:  12/25/22 (!) 162/74         Passed - K in normal range and within 180 days    Potassium  Date Value Ref Range Status  12/24/2022 3.7 3.5 - 5.1 mmol/L Final  03/11/2015 3.4 (L) mmol/L Final    Comment:    3.5-5.1 NOTE: New Reference Range  01/29/15          Passed - Patient is not pregnant      Passed - Valid encounter within last 6 months    Recent Outpatient Visits           2 months ago Essential hypertension, benign   [name], DO   7 months ago Pruritic erythematous rash   [facility] [name], DO   8 months ago Type 2 diabetes mellitus with other specified complication, without long-term current use of insulin [name]   [place], DO   1 year ago Annual physical exam   [place], DO   1 year ago Chest wall pain   [name], DO       Future Appointments             In 3 months [name], [name], DO [facility], [place]   In 8  months [name], MD [place]             famotidine (PEPCID) 20 MG tablet [Pharmacy Med Name: FAMOTIDINE 20 MG TABLET] 180 tablet 1    Sig: TAKE 1 TABLET BY MOUTH TWICE A [place]     Gastroenterology:  H2 Antagonists Passed - 01/15/2023  1:33 PM      Passed - Valid encounter within last 12 months    Recent Outpatient Visits           2 months ago Essential hypertension, benign   [place], DO   7 months ago Pruritic erythematous rash   [place], DO   8 months ago Type 2 diabetes mellitus with other specified complication, without long-term current use of insulin [facility])   [place], DO   1 year ago Annual physical exam   [name], DO  1 year ago Chest wall pain   [name], [name], DO       Future Appointments             In 3 months [name], [name], [name], [name]   In 8 months [name], MD [name]             metFORMIN (GLUCOPHAGE) 500 MG tablet [Pharmacy Med Name: METFORMIN HCL 500 MG TABLET] 180 tablet 2    Sig: TAKE 1 TABLET BY MOUTH TWICE A DAY WITH MEALS     Endocrinology:  Diabetes - Biguanides Failed - 01/15/2023  1:33 PM      Failed - Cr in normal range and within 360 days    Creat  Date Value Ref Range Status  10/20/2021 0.85 0.50 - 1.05 mg/dL Final   Creatinine, Ser  Date Value Ref Range Status  12/24/2022 1.64 (H) 0.44 - 1.00 mg/dL Final   Creatinine, Urine  Date Value Ref Range Status  10/29/2022 131 20 - 275 mg/dL Final         Failed - eGFR in normal range and within 360 days    GFR, Est African American  Date Value Ref Range Status  10/15/2020 72 > OR = 60  mL/min/1.27m Final   GFR, Est Non African American  Date Value Ref Range Status  10/15/2020 62 > OR = 60 mL/min/1.745mFinal   GFR, Estimated  Date Value Ref Range Status  12/24/2022 33 (L) >60 mL/min Final    Comment:    (NOTE) Calculated using the CKD-EPI Creatinine Equation (2021)    GFR  Date Value Ref Range Status  12/28/2019 62.40 >60.00 mL/min Final   eGFR  Date Value Ref Range Status  10/20/2021 74 > OR = 60 mL/min/1.732minal    Comment:    The eGFR is based on the CKD-EPI 2021 equation. To calculate  the new eGFR from a previous Creatinine or Cystatin C result, go to https://www.kidney.org/professionals/ kdoqi/gfr%5Fcalculator          Failed - B12 Level in normal range and within 720 days    Vitamin B-12  Date Value Ref Range Status  10/20/2021 >2,000 (H) 200 - 1,100 pg/mL Final         Passed - HBA1C is between 0 and 7.9 and within 180 days    HB A1C (BAYER DCA - WAIVED)  Date Value Ref Range Status  07/13/2017 6.5 <7.0 % Final    Comment:                                          Diabetic Adult            <7.0                                       Healthy Adult        4.3 - 5.7                                                           (  DCCT/NGSP) American Diabetes Association's Summary of Glycemic Recommendations for Adults with Diabetes: Hemoglobin A1c <7.0%. More stringent glycemic goals (A1c <6.0%) may further reduce complications at the cost of increased risk of hypoglycemia.    Hgb A1c MFr Bld  Date Value Ref Range Status  10/21/2022 5.8 (H) <5.7 % of total Hgb Final    Comment:    For someone without known diabetes, a hemoglobin  A1c value between 5.7% and 6.4% is consistent with prediabetes and should be confirmed with a  follow-up test. . For someone with known diabetes, a value <7% indicates that their diabetes is well controlled. A1c targets should be individualized based on duration of diabetes, age, comorbid conditions, and  other considerations. . This assay result is consistent with an increased risk of diabetes. . Currently, no consensus exists regarding use of hemoglobin A1c for diagnosis of diabetes for children. [name] - Valid encounter within last 6 months    Recent Outpatient Visits           2 months ago Essential hypertension, benign   [facility] [name], [name], DO   7 months ago Pruritic erythematous rash   [name], DO   8 months ago Type 2 diabetes mellitus with other specified complication, without long-term current use of insulin ([place])   [place], DO   1 year ago Annual physical exam   [place], DO   1 year ago Chest wall pain   [place], DO       Future Appointments             In 3 months [name], [name], DO [facility], [place]   In 8 months [name], MD [place] within normal limits and completed in the last 12 months    WBC  Date Value Ref Range Status  12/24/2022 4.4 4.0 - 10.5 K/uL Final   RBC  Date Value Ref Range Status  12/24/2022 2.57 (L) 3.87 - 5.11 MIL/uL Final   Hemoglobin  Date Value Ref Range Status  12/24/2022 9.3 (L) 12.0 - 15.0 g/dL Final  07/13/2017 15.5 11.1 - 15.9 g/dL Final   HCT  Date Value Ref Range Status  12/24/2022 27.3 (L) 36.0 - 46.0 % Final   Hematocrit  Date Value Ref Range Status  07/13/2017 46.7 (H) 34.0 - 46.6 % Final   MCHC  Date Value Ref Range Status  12/24/2022 34.1 30.0 - 36.0 g/dL Final   [facility]  Date Value Ref Range Status  12/24/2022 36.2 (H) 26.0 - 34.0 pg Final   MCV  Date Value Ref Range Status  12/24/2022 106.2 (H) 80.0 - 100.0 fL Final  07/13/2017 94  79 - 97 fL Final  03/11/2015 93 80 - 100 fL Final   No results found for: "PLTCOUNTKUC", "LABPLAT", "POCPLA" RDW  Date Value Ref Range Status  12/24/2022 14.8 11.5 - 15.5 % Final  07/13/2017 13.1 12.3 - 15.4 % Final  03/11/2015 13.8 11.5 - 14.5 % Final

## 2023-01-21 ENCOUNTER — Ambulatory Visit
Admission: RE | Admit: 2023-01-21 | Discharge: 2023-01-21 | Disposition: A | Discharge status: Home / Self Care | Payer: Medicare Other | Source: Ambulatory Visit | Attending: Oncology | Admitting: Oncology

## 2023-01-21 ENCOUNTER — Inpatient Hospital Stay: Payer: Medicare Other

## 2023-01-21 ENCOUNTER — Encounter: Payer: Self-pay | Admitting: Pharmacist

## 2023-01-21 ENCOUNTER — Inpatient Hospital Stay: Payer: Medicare Other | Admitting: Pharmacist

## 2023-01-21 VITALS — BP 120/75 | HR 106

## 2023-01-21 VITALS — BP 126/79 | HR 120 | Temp 97.9°F | Resp 16 | Ht 65.0 in | Wt 181.0 lb

## 2023-01-21 DIAGNOSIS — C50919 Malignant neoplasm of unspecified site of unspecified female breast: Secondary | ICD-10-CM | POA: Diagnosis not present

## 2023-01-21 DIAGNOSIS — C50911 Malignant neoplasm of unspecified site of right female breast: Secondary | ICD-10-CM | POA: Diagnosis not present

## 2023-01-21 DIAGNOSIS — R519 Headache, unspecified: Secondary | ICD-10-CM | POA: Diagnosis not present

## 2023-01-21 DIAGNOSIS — G9389 Other specified disorders of brain: Secondary | ICD-10-CM | POA: Diagnosis not present

## 2023-01-21 DIAGNOSIS — Z79899 Other long term (current) drug therapy: Secondary | ICD-10-CM | POA: Diagnosis not present

## 2023-01-21 DIAGNOSIS — C50912 Malignant neoplasm of unspecified site of left female breast: Secondary | ICD-10-CM | POA: Diagnosis not present

## 2023-01-21 DIAGNOSIS — Z5111 Encounter for antineoplastic chemotherapy: Secondary | ICD-10-CM | POA: Diagnosis not present

## 2023-01-21 DIAGNOSIS — Z170 Estrogen receptor positive status [ER+]: Secondary | ICD-10-CM | POA: Diagnosis not present

## 2023-01-21 LAB — CBC WITH DIFFERENTIAL/PLATELET
Abs Immature Granulocytes: 0.01 10*3/uL (ref 0.00–0.07)
Basophils Absolute: 0.1 10*3/uL (ref 0.0–0.1)
Basophils Relative: 2 %
Eosinophils Absolute: 0.1 10*3/uL (ref 0.0–0.5)
Eosinophils Relative: 1 %
HCT: 28.5 % — ABNORMAL LOW (ref 36.0–46.0)
Hemoglobin: 9.8 g/dL — ABNORMAL LOW (ref 12.0–15.0)
Immature Granulocytes: 0 %
Lymphocytes Relative: 31 %
Lymphs Abs: 1.5 10*3/uL (ref 0.7–4.0)
MCH: 36.4 pg — ABNORMAL HIGH (ref 26.0–34.0)
MCHC: 34.4 g/dL (ref 30.0–36.0)
MCV: 105.9 fL — ABNORMAL HIGH (ref 80.0–100.0)
Monocytes Absolute: 1.1 10*3/uL — ABNORMAL HIGH (ref 0.1–1.0)
Monocytes Relative: 22 %
Neutro Abs: 2.2 10*3/uL (ref 1.7–7.7)
Neutrophils Relative %: 44 %
Platelets: 209 10*3/uL (ref 150–400)
RBC: 2.69 MIL/uL — ABNORMAL LOW (ref 3.87–5.11)
RDW: 15.7 % — ABNORMAL HIGH (ref 11.5–15.5)
WBC: 5 10*3/uL (ref 4.0–10.5)
nRBC: 0 % (ref 0.0–0.2)

## 2023-01-21 LAB — COMPREHENSIVE METABOLIC PANEL
ALT: 13 U/L (ref 0–44)
AST: 25 U/L (ref 15–41)
Albumin: 4.1 g/dL (ref 3.5–5.0)
Alkaline Phosphatase: 68 U/L (ref 38–126)
Anion gap: 14 (ref 5–15)
BUN: 27 mg/dL — ABNORMAL HIGH (ref 8–23)
CO2: 24 mmol/L (ref 22–32)
Calcium: 8.9 mg/dL (ref 8.9–10.3)
Chloride: 99 mmol/L (ref 98–111)
Creatinine, Ser: 1.92 mg/dL — ABNORMAL HIGH (ref 0.44–1.00)
GFR, Estimated: 28 mL/min — ABNORMAL LOW (ref >60)
Glucose, Bld: 124 mg/dL — ABNORMAL HIGH (ref 70–99)
Potassium: 3.5 mmol/L (ref 3.5–5.1)
Sodium: 137 mmol/L (ref 135–145)
Total Bilirubin: 0.3 mg/dL (ref 0.3–1.2)
Total Protein: 8 g/dL (ref 6.5–8.1)

## 2023-01-21 LAB — MAGNESIUM: Magnesium: 1.6 mg/dL — ABNORMAL LOW (ref 1.7–2.4)

## 2023-01-21 MED ORDER — ONDANSETRON HCL 8 MG PO TABS: 8.0000 mg | ORAL_TABLET | Freq: Three times a day (TID) | ORAL | 0 refills | Status: DC | PRN

## 2023-01-21 MED ORDER — SODIUM CHLORIDE 0.9 % IV SOLN
INTRAVENOUS | Status: DC
  Filled 2023-01-21 (×2): qty 250

## 2023-01-21 MED ORDER — FULVESTRANT 250 MG/5ML IM SOSY
500.0000 mg | PREFILLED_SYRINGE | Freq: Once | INTRAMUSCULAR | Status: AC
  Administered 2023-01-21: 500 mg via INTRAMUSCULAR
  Filled 2023-01-21: qty 10

## 2023-01-21 MED ORDER — GADOBUTROL 1 MMOL/ML IV SOLN
10.0000 mL | Freq: Once | INTRAVENOUS | Status: AC | PRN
  Administered 2023-01-21: 8 mL via INTRAVENOUS

## 2023-01-21 NOTE — Progress Notes (Signed)
[facility]  [phone] [phone]  Patient Care Team: [name], DO as PCP - General (Family Medicine) Pieter Partridge, DO as Consulting Physician (Neurology) Lloyd Huger, MD as Consulting Physician (Oncology)   Name of the patient: Cassidy Norris  [id number]  31-Aug-1951   Date of visit: 01/21/23  HPI: Patient is a 72 y.o. female with recurrent breast cancer, ER/PR positive/HER2 negative. Currently treating with Kisqali (ribociclib) and fulvestrant. She started ribociclib on 01/08/22.   Reason for Consult: Oral chemotherapy follow-up for ribociclib therapy.   PAST MEDICAL HISTORY: Past Medical History:  Diagnosis Date   Anemia    Arthritis not really diagnosis but have pain   B12 deficiency 07/23/2016   Will check levels to determine if injections are needed again. Await results. Recent CBC at [facility]   Blood transfusion without reported diagnosis 1983 had a miscarriage/dnc   Cancer ([place]) 12/30/2013   Multifocal disease: pT1c,m, N0(isolated tumor cells) Er/ PR positive, Her 2 negative.   Cancer ([place]) 01/09/2014   Invasive lobular carcinoma, 2.2 cm, T2,N1 ER/PR positive, HER-2/neu negative   Cataract 2014?   Centrilobular emphysema ([place]) [id number]   Complication of anesthesia 03/06/2022   Prolonged metabolism of Rocuronium during case.   Diabetes mellitus without complication ([place])    Diffuse cystic mastopathy    Erosive esophagitis    Essential hypertension, benign 09/24/2017   Family history of malignant neoplasm of gastrointestinal tract 2012   Fractured elbow 08/28/2014   Gastroesophageal reflux disease    Hx of metabolic acidosis with increased anion gap    Increased heart rate    Multiple sclerosis (HCC)    Neuromuscular disorder (HCC)    neuropathy in bil feet - left is worse.   Obesity, unspecified    Peripheral neuropathy    Personal history of tobacco use, presenting hazards  to health    Restless leg syndrome    Sleep apnea    uses CPAP   Special screening for malignant neoplasms, colon    Squamous cell carcinoma of skin 08/24/2022   SCCIS - scalp, ED&C    HEMATOLOGY/ONCOLOGY HISTORY:  Oncology History Overview Note  1. Bilateral carcinoma of breast, February, 2015. Right breast status post radical mastectomy. T1 C. N0M0 (isolated tumor cells in one lymph node) multifocal invasive cancer. Stage IC, Left breast, Status post radical mastectomy, T2, N1, M0 tumor. Stage II, RIght breast. Both tumors are estrogen receptor positive.  Progesterone receptor positive.  HER-2 receptor negative by FISH. Negative for BRCA mutation.  2. Started on Adriamycin and Cytoxan on March 01, 2014. Last cycle of Cytoxan and Adriamycin on May 01, 2014. Patient has finished last chemotherapy on September 9. (12 th dose was omitted because of neuropathy) 3.  Taking tamoxifen.  Patient had a poor tolerance to letrozole.(October, 2015) 4.  Patient is taking letrozole since November of 2015 5.  May, 2016 Letrozole has been put on hold because of bony pains 6.  Started on Aromasin from July of 2016    History of bilateral breast cancer (Resolved)  11/29/2013 Initial Diagnosis   Breast cancer bilateral, multifocal ER/PR pos, Her 2 neg,.     ALLERGIES:  has No Known Allergies.  MEDICATIONS:  Current Outpatient Medications  Medication Sig Dispense Refill   acetaminophen (TYLENOL) 500 MG tablet Take 1,000 mg by mouth 2 (two) times daily as needed for moderate pain or headache.     baclofen (LIORESAL) 10 MG tablet Take 1 tablet (  10 mg total) by mouth 3 (three) times daily as needed for muscle spasms. 1080 tablet 0   calcium carbonate (OSCAL) 1500 (600 Ca) MG TABS tablet Take by mouth 2 (two) times daily with a meal.     diclofenac Sodium (VOLTAREN) 1 % GEL Apply 2 g topically 3 (three) times daily as needed. 100 g 2   famotidine (PEPCID) 20 MG tablet TAKE 1 TABLET BY MOUTH TWICE A DAY  BEFORE MEALS 180 tablet 1   fluticasone (FLONASE) 50 MCG/ACT nasal spray PLACE 2 SPRAYS INTO BOTH NOSTRILS AT BEDTIME. 48 g 1   fulvestrant (FASLODEX) 250 MG/5ML injection Inject 500 mg into the muscle every 30 (thirty) days. One injection each buttock over 1-2 minutes. Warm prior to use.     Krill Oil 1000 MG CAPS Take 1,000 mg by mouth daily.     lisinopril (ZESTRIL) 5 MG tablet TAKE 1 TABLET BY MOUTH EVERY DAY IN THE EVENING 90 tablet 3   loperamide (IMODIUM A-D) 2 MG tablet Take 2 tablets (4 mg total) by mouth 4 (four) times daily as needed for diarrhea or loose stools. 30 tablet 0   metFORMIN (GLUCOPHAGE) 500 MG tablet TAKE 1 TABLET BY MOUTH TWICE A DAY WITH MEALS 180 tablet 1   mometasone (ELOCON) 0.1 % cream Apply to affected skin qd-bid prn flares 45 g 2   omeprazole (PRILOSEC) 40 MG capsule TAKE 1 CAPSULE (40 MG TOTAL) BY MOUTH 2 (TWO) TIMES DAILY BEFORE A MEAL. 180 capsule 1   prochlorperazine (COMPAZINE) 10 MG tablet Take 1 tablet (10 mg total) by mouth every 6 (six) hours as needed for nausea or vomiting. 20 tablet 1   ribociclib succ (KISQALI, 600 MG DOSE,) 200 MG Therapy Pack Take 3 tablets (600 mg total) by mouth daily. Take for 21 days on, 7 days off, repeat every 28 days. 63 tablet 5   sodium chloride (OCEAN) 0.65 % SOLN nasal spray Place 1 spray into both nostrils as needed for congestion.     traZODone (DESYREL) 50 MG tablet Take 1 tablet (50 mg total) by mouth at bedtime as needed. for sleep 90 tablet 3   venlafaxine XR (EFFEXOR-XR) 75 MG 24 hr capsule Take 1 capsule (75 mg total) by mouth daily with breakfast. 360 capsule 0   vitamin B-12 (CYANOCOBALAMIN) 1000 MCG tablet Take 2,500 mcg by mouth every Monday, Wednesday, and Friday.     Vitamin D, Cholecalciferol, 50 MCG (2000 UT) CAPS Take 2,000 Units by mouth every Monday, Wednesday, and Friday.     No current facility-administered medications for this visit.   Facility-Administered Medications Ordered in Other Visits   Medication Dose Route Frequency Provider Last Rate Last Admin   0.9 %  sodium chloride infusion   Intravenous Continuous Darl Pikes, RPH-CPP 999 mL/hr at 01/21/23 1037 New Bag at 01/21/23 1037    VITAL SIGNS: BP 126/79 (BP Location: Right Arm, Patient Position: Sitting, Cuff Size: Normal)   Pulse (!) 120   Temp 97.9 F (36.6 C) (Tympanic)   Resp 16   Ht '5\' 5"'$  (1.651 m)   Wt 82.1 kg (181 lb)   LMP 12/22/1999 (Approximate)   SpO2 100%   BMI 30.12 kg/m  Filed Weights   01/21/23 0938  Weight: 82.1 kg (181 lb)    Estimated body mass index is 30.12 kg/m as calculated from the following:   Height as of this encounter: '5\' 5"'$  (1.651 m).   Weight as of this encounter: 82.1 kg (181 lb).  LABS: CBC:    Component Value Date/Time   WBC 5.0 01/21/2023 0914   HGB 9.8 (L) 01/21/2023 0914   HGB 15.5 07/13/2017 0937   HCT 28.5 (L) 01/21/2023 0914   HCT 46.7 (H) 07/13/2017 0937   PLT 209 01/21/2023 0914   PLT 211 07/13/2017 0937   MCV 105.9 (H) 01/21/2023 0914   MCV 94 07/13/2017 0937   MCV 93 03/11/2015 1356   NEUTROABS 2.2 01/21/2023 0914   NEUTROABS 5.4 07/13/2017 0937   NEUTROABS 5.6 03/11/2015 1356   LYMPHSABS 1.5 01/21/2023 0914   LYMPHSABS 2.1 07/13/2017 0937   LYMPHSABS 2.3 03/11/2015 1356   MONOABS 1.1 (H) 01/21/2023 0914   MONOABS 1.0 (H) 03/11/2015 1356   EOSABS 0.1 01/21/2023 0914   EOSABS 0.2 07/13/2017 0937   EOSABS 0.3 03/11/2015 1356   BASOSABS 0.1 01/21/2023 0914   BASOSABS 0.0 07/13/2017 0937   BASOSABS 0.1 03/11/2015 1356   Comprehensive Metabolic Panel:    Component Value Date/Time   NA 137 01/21/2023 0914   NA 142 07/14/2019 1516   NA 139 03/11/2015 1356   K 3.5 01/21/2023 0914   K 3.4 (L) 03/11/2015 1356   CL 99 01/21/2023 0914   CL 103 03/11/2015 1356   CO2 24 01/21/2023 0914   CO2 28 03/11/2015 1356   BUN 27 (H) 01/21/2023 0914   BUN 12 07/14/2019 1516   BUN 13 03/11/2015 1356   CREATININE 1.92 (H) 01/21/2023 0914   CREATININE 0.85  10/20/2021 0838   GLUCOSE 124 (H) 01/21/2023 0914   GLUCOSE 118 (H) 03/11/2015 1356   CALCIUM 8.9 01/21/2023 0914   CALCIUM 9.2 03/11/2015 1356   AST 25 01/21/2023 0914   AST 28 03/11/2015 1356   ALT 13 01/21/2023 0914   ALT 32 03/11/2015 1356   ALKPHOS 68 01/21/2023 0914   ALKPHOS 68 03/11/2015 1356   BILITOT 0.3 01/21/2023 0914   BILITOT 0.4 07/14/2019 1516   BILITOT 0.7 03/11/2015 1356   PROT 8.0 01/21/2023 0914   PROT 6.9 07/14/2019 1516   PROT 6.9 03/11/2015 1356   ALBUMIN 4.1 01/21/2023 0914   ALBUMIN 4.5 07/14/2019 1516   ALBUMIN 4.2 03/11/2015 1356     Present during today's visit: patient only  Assessment and Plan: CBC and CMP assesed with pateint, continue ribociclib '600mg'$  21 days on/ 7 days off.  Patient will receive fulvestrant today.  SCr was elevated to 1.92 today and patient reports nausea/vomiting with decreased fluid intake.  Added on for fluid clinic today, 1L IV fluids Prescription for ondansetron sent to patient's pharmacy. Recommended that patient take on a scheduled basis for 2-3 days to see if this improves nausea and vomting. Patient reported right sided eye, ear and neck pain/swelling with headaches.  Discussed with Dr. Grayland Ormond MRI of head/neck ordered today Recommended that patient pick up an over the counter antihistamine (Zyrtec) to see if that helps with the pain and pressure of the right ear and eye. In case this is allergy related   Oral Chemotherapy Side Effect/Intolerance:  Nausea/vomiting: Patient reports nausea and vomiting on 2/12, 2/14 and 2/28. She reports that she took antiemetics, but that she experienced vomiting after taking them.  Patient reports an ongoing headache, which is sometimes painful to touch. She also reports right eye and ear pain. BP at today's visit well controlled.   Oral Chemotherapy Adherence: There are no patient barriers to medication adherence identified.   New medications: None reported  Medication Access  Issues: She has been approved  for 2024 [employer] patient assistance   Patient expressed understanding and was in agreement with this plan. She also understands that She can call clinic at any time with any questions, concerns, or complaints.   Follow-up plan: RTC in 4 weeks.   Thank you for allowing me to participate in the care of this very pleasant patient.   Time Total: 20 minutes  Visit consisted of counseling and education on dealing with issues of symptom management in the setting of serious and potentially life-threatening illness.Greater than 50%  of this time was spent counseling and coordinating care related to the above assessment and plan.  Signed by: Darl Pikes, PharmD, BCPS, Salley Slaughter, CPP Hematology/Oncology Clinical Pharmacist Practitioner [place]/DB/AP [facility] [phone]  01/21/2023 10:50 AM

## 2023-01-21 NOTE — Progress Notes (Signed)
Has had a headache the last 3 weeks. Has been getting nauseated and sick with the headaches. Has noted swelling in both sides of jaws on certain days and pain in Right eye with the headaches. Chest pain in right side since headache.

## 2023-01-23 LAB — CANCER ANTIGEN 27.29: CA 27.29: 89.9 U/mL — ABNORMAL HIGH (ref 0.0–38.6)

## 2023-02-10 DIAGNOSIS — H8112 Benign paroxysmal vertigo, left ear: Secondary | ICD-10-CM | POA: Diagnosis not present

## 2023-02-10 DIAGNOSIS — J310 Chronic rhinitis: Secondary | ICD-10-CM | POA: Diagnosis not present

## 2023-02-17 ENCOUNTER — Other Ambulatory Visit: Payer: Self-pay

## 2023-02-17 DIAGNOSIS — H8112 Benign paroxysmal vertigo, left ear: Secondary | ICD-10-CM | POA: Diagnosis not present

## 2023-02-17 DIAGNOSIS — C50919 Malignant neoplasm of unspecified site of unspecified female breast: Secondary | ICD-10-CM

## 2023-02-18 ENCOUNTER — Inpatient Hospital Stay: Payer: Medicare Other

## 2023-02-18 ENCOUNTER — Ambulatory Visit: Payer: Medicare Other

## 2023-02-18 ENCOUNTER — Inpatient Hospital Stay (HOSPITAL_BASED_OUTPATIENT_CLINIC_OR_DEPARTMENT_OTHER): Payer: Medicare Other | Admitting: Oncology

## 2023-02-18 ENCOUNTER — Encounter: Payer: Self-pay | Admitting: Oncology

## 2023-02-18 ENCOUNTER — Inpatient Hospital Stay: Payer: Medicare Other | Attending: Oncology

## 2023-02-18 VITALS — BP 122/76 | HR 105 | Temp 98.1°F | Resp 16 | Ht 65.0 in | Wt 188.0 lb

## 2023-02-18 DIAGNOSIS — C50912 Malignant neoplasm of unspecified site of left female breast: Secondary | ICD-10-CM | POA: Insufficient documentation

## 2023-02-18 DIAGNOSIS — N289 Disorder of kidney and ureter, unspecified: Secondary | ICD-10-CM | POA: Insufficient documentation

## 2023-02-18 DIAGNOSIS — E860 Dehydration: Secondary | ICD-10-CM

## 2023-02-18 DIAGNOSIS — I10 Essential (primary) hypertension: Secondary | ICD-10-CM | POA: Diagnosis not present

## 2023-02-18 DIAGNOSIS — Z87891 Personal history of nicotine dependence: Secondary | ICD-10-CM | POA: Diagnosis not present

## 2023-02-18 DIAGNOSIS — Z5111 Encounter for antineoplastic chemotherapy: Secondary | ICD-10-CM | POA: Insufficient documentation

## 2023-02-18 DIAGNOSIS — D649 Anemia, unspecified: Secondary | ICD-10-CM | POA: Diagnosis not present

## 2023-02-18 DIAGNOSIS — C50919 Malignant neoplasm of unspecified site of unspecified female breast: Secondary | ICD-10-CM

## 2023-02-18 DIAGNOSIS — G35 Multiple sclerosis: Secondary | ICD-10-CM | POA: Insufficient documentation

## 2023-02-18 DIAGNOSIS — Z9013 Acquired absence of bilateral breasts and nipples: Secondary | ICD-10-CM | POA: Insufficient documentation

## 2023-02-18 DIAGNOSIS — E119 Type 2 diabetes mellitus without complications: Secondary | ICD-10-CM | POA: Insufficient documentation

## 2023-02-18 DIAGNOSIS — Z79899 Other long term (current) drug therapy: Secondary | ICD-10-CM | POA: Diagnosis not present

## 2023-02-18 DIAGNOSIS — Z170 Estrogen receptor positive status [ER+]: Secondary | ICD-10-CM | POA: Insufficient documentation

## 2023-02-18 DIAGNOSIS — Z7984 Long term (current) use of oral hypoglycemic drugs: Secondary | ICD-10-CM | POA: Diagnosis not present

## 2023-02-18 DIAGNOSIS — M8580 Other specified disorders of bone density and structure, unspecified site: Secondary | ICD-10-CM | POA: Insufficient documentation

## 2023-02-18 DIAGNOSIS — C50911 Malignant neoplasm of unspecified site of right female breast: Secondary | ICD-10-CM | POA: Insufficient documentation

## 2023-02-18 DIAGNOSIS — Z79811 Long term (current) use of aromatase inhibitors: Secondary | ICD-10-CM | POA: Insufficient documentation

## 2023-02-18 LAB — CBC WITH DIFFERENTIAL (CANCER CENTER ONLY)
Abs Immature Granulocytes: 0.01 10*3/uL (ref 0.00–0.07)
Basophils Absolute: 0.1 10*3/uL (ref 0.0–0.1)
Basophils Relative: 2 %
Eosinophils Absolute: 0.1 10*3/uL (ref 0.0–0.5)
Eosinophils Relative: 2 %
HCT: 27.4 % — ABNORMAL LOW (ref 36.0–46.0)
Hemoglobin: 9.1 g/dL — ABNORMAL LOW (ref 12.0–15.0)
Immature Granulocytes: 0 %
Lymphocytes Relative: 29 %
Lymphs Abs: 1.2 10*3/uL (ref 0.7–4.0)
MCH: 36 pg — ABNORMAL HIGH (ref 26.0–34.0)
MCHC: 33.2 g/dL (ref 30.0–36.0)
MCV: 108.3 fL — ABNORMAL HIGH (ref 80.0–100.0)
Monocytes Absolute: 0.7 10*3/uL (ref 0.1–1.0)
Monocytes Relative: 16 %
Neutro Abs: 2.2 10*3/uL (ref 1.7–7.7)
Neutrophils Relative %: 51 %
Platelet Count: 211 10*3/uL (ref 150–400)
RBC: 2.53 MIL/uL — ABNORMAL LOW (ref 3.87–5.11)
RDW: 16 % — ABNORMAL HIGH (ref 11.5–15.5)
WBC Count: 4.2 10*3/uL (ref 4.0–10.5)
nRBC: 0 % (ref 0.0–0.2)

## 2023-02-18 LAB — CMP (CANCER CENTER ONLY)
ALT: 17 U/L (ref 0–44)
AST: 33 U/L (ref 15–41)
Albumin: 4.2 g/dL (ref 3.5–5.0)
Alkaline Phosphatase: 63 U/L (ref 38–126)
Anion gap: 11 (ref 5–15)
BUN: 24 mg/dL — ABNORMAL HIGH (ref 8–23)
CO2: 24 mmol/L (ref 22–32)
Calcium: 8.8 mg/dL — ABNORMAL LOW (ref 8.9–10.3)
Chloride: 102 mmol/L (ref 98–111)
Creatinine: 1.91 mg/dL — ABNORMAL HIGH (ref 0.44–1.00)
GFR, Estimated: 28 mL/min — ABNORMAL LOW (ref >60)
Glucose, Bld: 118 mg/dL — ABNORMAL HIGH (ref 70–99)
Potassium: 4.1 mmol/L (ref 3.5–5.1)
Sodium: 137 mmol/L (ref 135–145)
Total Bilirubin: 0.3 mg/dL (ref 0.3–1.2)
Total Protein: 7.7 g/dL (ref 6.5–8.1)

## 2023-02-18 LAB — MAGNESIUM: Magnesium: 1.8 mg/dL (ref 1.7–2.4)

## 2023-02-18 MED ORDER — FULVESTRANT 250 MG/5ML IM SOSY
500.0000 mg | PREFILLED_SYRINGE | Freq: Once | INTRAMUSCULAR | Status: AC
  Administered 2023-02-18: 500 mg via INTRAMUSCULAR
  Filled 2023-02-18: qty 10

## 2023-02-18 MED ORDER — SODIUM CHLORIDE 0.9 % IV SOLN
INTRAVENOUS | Status: DC
  Filled 2023-02-18 (×2): qty 250

## 2023-02-18 NOTE — Progress Notes (Signed)
[place]  [phone]  Fax:([phone]) [place] DOB: 1951/03/12  MR#: [id number]  [id number]  Patient Care Team: [name], DO as PCP - General (Family Medicine) Pieter Partridge, DO as Consulting Physician (Neurology) Lloyd Huger, MD as Consulting Physician (Oncology)  CHIEF COMPLAINT: Recurrent, metastatic ER/PR, HER2 negative invasive carcinoma of the breast.    INTERVAL HISTORY: Patient returns to clinic today for repeat laboratory work, further evaluation, and continuation of Kisqali and fulvestrant.  She currently feels well and is asymptomatic.  She is tolerating her treatments without significant side effects.  She has no new neurologic complaints.  She denies any recent fevers or illnesses. She has a good appetite and denies weight loss.  She has no chest pain, shortness of breath, cough, or hemoptysis.  She denies any nausea, vomiting, or constipation. She has no urinary complaints.  Patient offers no specific complaints today.  REVIEW OF SYSTEMS:   Review of Systems  Constitutional: Negative.  Negative for fever, malaise/fatigue and weight loss.  Respiratory: Negative.  Negative for cough, hemoptysis and shortness of breath.   Cardiovascular: Negative.  Negative for chest pain and leg swelling.  Gastrointestinal: Negative.  Negative for abdominal pain.  Genitourinary: Negative.  Negative for dysuria.  Musculoskeletal: Negative.  Negative for joint pain and myalgias.  Skin: Negative.  Negative for rash.  Neurological: Negative.  Negative for dizziness, sensory change, focal weakness, weakness and headaches.  Psychiatric/Behavioral: Negative.  The patient is not nervous/anxious.     As per HPI. Otherwise, a complete review of systems is negative.  ONCOLOGY HISTORY: Oncology History Overview Note  1. Bilateral carcinoma of breast, February, 2015. Right breast status post radical mastectomy. T1 C. N0M0  (isolated tumor cells in one lymph node) multifocal invasive cancer. Stage IC, Left breast, Status post radical mastectomy, T2, N1, M0 tumor. Stage II, RIght breast. Both tumors are estrogen receptor positive.  Progesterone receptor positive.  HER-2 receptor negative by FISH. Negative for BRCA mutation.  2. Started on Adriamycin and Cytoxan on March 01, 2014. Last cycle of Cytoxan and Adriamycin on May 01, 2014. Patient has finished last chemotherapy on September 9. (12 th dose was omitted because of neuropathy) 3.  Taking tamoxifen.  Patient had a poor tolerance to letrozole.(October, 2015) 4.  Patient is taking letrozole since November of 2015 5.  May, 2016 Letrozole has been put on hold because of bony pains 6.  Started on Aromasin from July of 2016    History of bilateral breast cancer (Resolved)  11/29/2013 Initial Diagnosis   Breast cancer bilateral, multifocal ER/PR pos, Her 2 neg,.     PAST MEDICAL HISTORY: Past Medical History:  Diagnosis Date   Anemia    Arthritis not really diagnosis but have pain   B12 deficiency 07/23/2016   Will check levels to determine if injections are needed again. Await results. Recent CBC at Onc WNL   Blood transfusion without reported diagnosis 1983 had a miscarriage/dnc   Cancer ([place]) 12/30/2013   Multifocal disease: pT1c,m, N0(isolated tumor cells) Er/ PR positive, Her 2 negative.   Cancer ([place]) 01/09/2014   Invasive lobular carcinoma, 2.2 cm, T2,N1 ER/PR positive, HER-2/neu negative   Cataract 2014?   Centrilobular emphysema ([place]) [id number]   Complication of anesthesia 03/06/2022   Prolonged metabolism of Rocuronium during case.   Diabetes mellitus without complication ([place])    Diffuse cystic mastopathy    Erosive esophagitis    Essential hypertension, benign  09/24/2017   Family history of malignant neoplasm of gastrointestinal tract 2012   Fractured elbow 08/28/2014   Gastroesophageal reflux disease    Hx of metabolic acidosis with  increased anion gap    Increased heart rate    Multiple sclerosis (HCC)    Neuromuscular disorder (HCC)    neuropathy in bil feet - left is worse.   Obesity, unspecified    Peripheral neuropathy    Personal history of tobacco use, presenting hazards to health    Restless leg syndrome    Sleep apnea    uses CPAP   Special screening for malignant neoplasms, colon    Squamous cell carcinoma of skin 08/24/2022   SCCIS - scalp, ED&C    PAST SURGICAL HISTORY: Past Surgical History:  Procedure Laterality Date   BREAST BIOPSY Right 1973,2001   BREAST BIOPSY Right 11/07/2013   INVASIVE MAMMARY CARCINOMA , ER/PR positive Her2 negative   BREAST BIOPSY Left 11/27/2013   invasive lobular and DCIS   BREAST SURGERY Bilateral 01/09/2014   mastectomy   cataract surgery Left 08/13/2022   CHOLECYSTECTOMY     COLONOSCOPY  2010   Dr. Jamal Collin   COLONOSCOPY WITH PROPOFOL N/A 06/11/2015   Procedure: COLONOSCOPY WITH PROPOFOL;  Surgeon: Christene Lye, MD;  Location: [facility] ENDOSCOPY;  Service: Endoscopy;  Laterality: N/A;   COLONOSCOPY WITH PROPOFOL N/A 12/23/2021   Procedure: COLONOSCOPY WITH PROPOFOL;  Surgeon: Lin Landsman, MD;  Location: [facility] ENDOSCOPY;  Service: Gastroenterology;  Laterality: N/A;   DILATATION & CURETTAGE/HYSTEROSCOPY WITH MYOSURE N/A 07/12/2018   Procedure: DILATATION & CURETTAGE/HYSTEROSCOPY WITH MYOSURE WITH ENDOMETRIAL POLYPECTOMY;  Surgeon: Will Bonnet, MD;  Location: [facility] ORS;  Service: Gynecology;  Laterality: N/A;   DILATION AND CURETTAGE OF UTERUS  1983   ESOPHAGOGASTRODUODENOSCOPY N/A 12/23/2021   Procedure: ESOPHAGOGASTRODUODENOSCOPY (EGD);  Surgeon: Lin Landsman, MD;  Location: [facility] ENDOSCOPY;  Service: Gastroenterology;  Laterality: N/A;   JOINT REPLACEMENT  11/23/2015   KNEE ARTHROPLASTY Right 03/06/2022   Procedure: COMPUTER ASSISTED TOTAL KNEE ARTHROPLASTY;  Surgeon: Dereck Leep, MD;  Location: [facility] ORS;  Service: Orthopedics;   Laterality: Right;   POLYPECTOMY  1989   PORTA CATH REMOVAL     PORTACATH PLACEMENT  2015   SKIN BIOPSY     on scalp   SPINE SURGERY  09/14/2014   Ruptured disk L4- L5   TOTAL KNEE ARTHROPLASTY Left 11/22/2015   Procedure: TOTAL KNEE ARTHROPLASTY;  Surgeon: Earlie Server, MD;  Location: [place];  Service: Orthopedics;  Laterality: Left;   TUBAL LIGATION     WISDOM TOOTH EXTRACTION  2006    FAMILY HISTORY Family History  Problem Relation Age of Onset   Cancer Mother        lung   COPD Mother    Cancer Father        colon, stomach   Diabetes Maternal Grandmother    Hypertension Sister    Rectal cancer Paternal Uncle    Rectal cancer Paternal Uncle     GYNECOLOGIC HISTORY:  Patient's last menstrual period was 12/22/1999 (approximate).     ADVANCED DIRECTIVES:    HEALTH MAINTENANCE: Social History   Tobacco Use   Smoking status: Former    Packs/day: 1.00    Years: 30.00    Additional pack years: 0.00    Total pack years: 30.00    Types: Cigarettes    Quit date: 12/11/2013    Years since quitting: 9.1   Smokeless tobacco: Former  Media [employer]  Vaping Use: Never used  Substance Use Topics   Alcohol use: Not Currently   Drug use: No     Colonoscopy:  PAP:  Bone density:  Mammogram:  No Known Allergies  Current Outpatient Medications  Medication Sig Dispense Refill   acetaminophen (TYLENOL) 500 MG tablet Take 1,000 mg by mouth 2 (two) times daily as needed for moderate pain or headache.     baclofen (LIORESAL) 10 MG tablet Take 1 tablet (10 mg total) by mouth 3 (three) times daily as needed for muscle spasms. 1080 tablet 0   calcium carbonate (OSCAL) 1500 (600 Ca) MG TABS tablet Take by mouth 2 (two) times daily with a meal.     cetirizine (ZYRTEC) 10 MG chewable tablet Chew 10 mg by mouth daily.     diclofenac Sodium (VOLTAREN) 1 % GEL Apply 2 g topically 3 (three) times daily as needed. 100 g 2   famotidine (PEPCID) 20 MG tablet TAKE 1 TABLET BY MOUTH TWICE  A DAY BEFORE MEALS 180 tablet 1   fluticasone (FLONASE) 50 MCG/ACT nasal spray PLACE 2 SPRAYS INTO BOTH NOSTRILS AT BEDTIME. 48 g 1   fulvestrant (FASLODEX) 250 MG/5ML injection Inject 500 mg into the muscle every 30 (thirty) days. One injection each buttock over 1-2 minutes. Warm prior to use.     Krill Oil 1000 MG CAPS Take 1,000 mg by mouth daily.     lisinopril (ZESTRIL) 5 MG tablet TAKE 1 TABLET BY MOUTH EVERY DAY IN THE EVENING 90 tablet 3   loperamide (IMODIUM A-D) 2 MG tablet Take 2 tablets (4 mg total) by mouth 4 (four) times daily as needed for diarrhea or loose stools. 30 tablet 0   metFORMIN (GLUCOPHAGE) 500 MG tablet TAKE 1 TABLET BY MOUTH TWICE A DAY WITH MEALS 180 tablet 1   mometasone (ELOCON) 0.1 % cream Apply to affected skin qd-bid prn flares 45 g 2   ondansetron (ZOFRAN) 8 MG tablet Take 1 tablet (8 mg total) by mouth every 8 (eight) hours as needed for nausea or vomiting. 20 tablet 0   prochlorperazine (COMPAZINE) 10 MG tablet Take 1 tablet (10 mg total) by mouth every 6 (six) hours as needed for nausea or vomiting. 20 tablet 1   ribociclib succ (KISQALI, 600 MG DOSE,) 200 MG Therapy Pack Take 3 tablets (600 mg total) by mouth daily. Take for 21 days on, 7 days off, repeat every 28 days. 63 tablet 5   sodium chloride (OCEAN) 0.65 % SOLN nasal spray Place 1 spray into both nostrils as needed for congestion.     traZODone (DESYREL) 50 MG tablet Take 1 tablet (50 mg total) by mouth at bedtime as needed. for sleep 90 tablet 3   venlafaxine XR (EFFEXOR-XR) 75 MG 24 hr capsule Take 1 capsule (75 mg total) by mouth daily with breakfast. 360 capsule 0   vitamin B-12 (CYANOCOBALAMIN) 1000 MCG tablet Take 2,500 mcg by mouth every Monday, Wednesday, and Friday.     Vitamin D, Cholecalciferol, 50 MCG (2000 UT) CAPS Take 2,000 Units by mouth every Monday, Wednesday, and Friday.     omeprazole (PRILOSEC) 40 MG capsule TAKE 1 CAPSULE (40 MG TOTAL) BY MOUTH 2 (TWO) TIMES DAILY BEFORE A MEAL. 180  capsule 1   No current facility-administered medications for this visit.   Facility-Administered Medications Ordered in Other Visits  Medication Dose Route Frequency Provider Last Rate Last Admin   fulvestrant (FASLODEX) injection 500 mg  500 mg Intramuscular Once Susane Bey J,  MD        OBJECTIVE: BP 122/76 (BP Location: Left Arm, Patient Position: Sitting, Cuff Size: Normal)   Pulse (!) 105   Temp 98.1 F (36.7 C) (Tympanic)   Resp 16   Ht 5\' 5"  (1.651 m)   Wt 188 lb (85.3 kg)   LMP 12/22/1999 (Approximate)   SpO2 100%   BMI 31.28 kg/m    Body mass index is 31.28 kg/m.    ECOG FS:0 - Asymptomatic  General: Well-developed, well-nourished, no acute distress. Eyes: Pink conjunctiva, anicteric sclera. HEENT: Normocephalic, moist mucous membranes. Breast: Bilateral mastectomy. Lungs: No audible wheezing or coughing. Heart: Regular rate and rhythm. Abdomen: Soft, nontender, no obvious distention. Musculoskeletal: No edema, cyanosis, or clubbing. Neuro: Alert, answering all questions appropriately. Cranial nerves grossly intact. Skin: No rashes or petechiae noted. Psych: Normal affect.  LAB RESULTS:  Appointment on 02/18/2023  Component Date Value Ref Range Status   Magnesium 02/18/2023 1.8  1.7 - 2.4 mg/dL Final   Performed at [facility], [address] [place], [place] [address]   Sodium 02/18/2023 137  135 - 145 mmol/L Final   Potassium 02/18/2023 4.1  3.5 - 5.1 mmol/L Final   Chloride 02/18/2023 102  98 - 111 mmol/L Final   CO2 02/18/2023 24  22 - 32 mmol/L Final   Glucose, Bld 02/18/2023 118 (H)  70 - 99 mg/dL Final   Glucose reference range applies only to samples taken after fasting for at least 8 hours.   BUN 02/18/2023 24 (H)  8 - 23 mg/dL Final   Creatinine 02/18/2023 1.91 (H)  0.44 - 1.00 mg/dL Final   Calcium 02/18/2023 8.8 (L)  8.9 - 10.3 mg/dL Final   Total Protein 02/18/2023 7.7  6.5 - 8.1 g/dL Final   Albumin 02/18/2023 4.2  3.5 - 5.0  g/dL Final   AST 02/18/2023 33  15 - 41 U/L Final   ALT 02/18/2023 17  0 - 44 U/L Final   Alkaline Phosphatase 02/18/2023 63  38 - 126 U/L Final   Total Bilirubin 02/18/2023 0.3  0.3 - 1.2 mg/dL Final   GFR, Estimated 02/18/2023 28 (L)  >60 mL/min Final   Comment: (NOTE) Calculated using the CKD-EPI Creatinine Equation (2021)    Anion gap 02/18/2023 11  5 - 15 Final   Performed at [facility], [address] [place], [place] [address]   WBC Count 02/18/2023 4.2  4.0 - 10.5 K/uL Final   RBC 02/18/2023 2.53 (L)  3.87 - 5.11 MIL/uL Final   Hemoglobin 02/18/2023 9.1 (L)  12.0 - 15.0 g/dL Final   HCT 02/18/2023 27.4 (L)  36.0 - 46.0 % Final   MCV 02/18/2023 108.3 (H)  80.0 - 100.0 fL Final   MCH 02/18/2023 36.0 (H)  26.0 - 34.0 pg Final   MCHC 02/18/2023 33.2  30.0 - 36.0 g/dL Final   RDW 02/18/2023 16.0 (H)  11.5 - 15.5 % Final   Platelet Count 02/18/2023 211  150 - 400 K/uL Final   nRBC 02/18/2023 0.0  0.0 - 0.2 % Final   Neutrophils Relative % 02/18/2023 51  % Final   Neutro Abs 02/18/2023 2.2  1.7 - 7.7 K/uL Final   Lymphocytes Relative 02/18/2023 29  % Final   Lymphs Abs 02/18/2023 1.2  0.7 - 4.0 K/uL Final   Monocytes Relative 02/18/2023 16  % Final   Monocytes Absolute 02/18/2023 0.7  0.1 - 1.0 K/uL Final   Eosinophils Relative 02/18/2023 2  %  Final   Eosinophils Absolute 02/18/2023 0.1  0.0 - 0.5 K/uL Final   Basophils Relative 02/18/2023 2  % Final   Basophils Absolute 02/18/2023 0.1  0.0 - 0.1 K/uL Final   Immature Granulocytes 02/18/2023 0  % Final   Abs Immature Granulocytes 02/18/2023 0.01  0.00 - 0.07 K/uL Final   Performed at [facility], [address] [place],  [address]    STUDIES: No results found.  ONCOLOGY HISTORY:  Bilateral adenocarcinoma of the breast. Patient is status post bilateral mastectomy in February 2015. She also received adjuvant chemotherapy with Adriamycin, Cytoxan, and Taxol completing treatment in approximately October  2015. She then initiated letrozole, but could not tolerate secondary to leg pain and was switched to Aromasin.  Patient then switched to tamoxifen in October 2019 secondary to cost.  Patient subsequently discontinued tamoxifen and did not complete the recommended 5 years of treatment.   ASSESSMENT: Recurrent, metastatic ER/PR, HER2 negative invasive carcinoma of the breast.   PLAN:    Recurrent, metastatic ER/PR, HER2 negative invasive carcinoma of the breast: See oncology history as above.  Biopsy confirmed recurrent disease.  PET scan results from December 26, 2021 reviewed independently with hypermetabolic left supraclavicular, subpectoral, and axillary lymphadenopathy consistent with nodal recurrence of patient's history of breast cancer.  No distant metastases were identified.  Repeat PET scan on April 23, 2022 reviewed independently with significant improvement of patient's disease burden.  Her initial CA 27-29 initially was 260.1.  Now seems to have plateaued between 81.1 and 91.  Her most recent result is 86.7.  Continue Kisqali 600 mg daily for 21 days with 7 days off.  Proceed with fulvestrant today.  Return to clinic in 4 weeks for for continuation of treatment and further evaluation by clinical pharmacy.  Patient will then return to clinic in 8 weeks for evaluation by MD and treatment.  Consider repeat imaging in June 2024.  Osteopenia: Patient's last bone mineral density was on August 01, 2016 and revealed a T-score of -1.2. Continue monitoring bone mineral density by PCP. Multiple sclerosis: Chronic and unchanged.  Continue evaluation and treatment per neurology.  Recent MRI of the brain did not reveal any new lesions. Esophageal discomfort/mild dysphagia: Patient does not complain of this today.  Recent colonoscopy and EGD did not reveal any significant pathology. Renal insufficiency: Chronic and unchanged.  Patient's most recent GFR is 28.  Continue to monitor and may need to dose reduced  his quality in the near future.  Kisqali needs to be dose reduced if GFR falls below 30. Anemia: Chronic and unchanged.  Patient's hemoglobin is 9.1 today.   Diarrhea: Patient does not complain of this today.  Continue Imodium as needed.    Patient expressed understanding and was in agreement with this plan. She also understands that She can call clinic at any time with any questions, concerns, or complaints.   Breast cancer bilateral, multifocal ER/PR pos, Her 2 neg,.   Staging form: Breast, AJCC 7th Edition     Clinical: Stage IIB (T2, N1, M0) - Signed by Forest Gleason, MD on 04/22/2015   Lloyd Huger, MD   02/18/2023 10:52 AM

## 2023-02-18 NOTE — Progress Notes (Signed)
Having issues with left leg "drawing in" and affecting her walking. Started 1 week and a half ago which is on and off. Hands are cramping.

## 2023-02-19 ENCOUNTER — Encounter: Payer: Self-pay | Admitting: Oncology

## 2023-02-19 LAB — CANCER ANTIGEN 27.29: CA 27.29: 86.7 U/mL — ABNORMAL HIGH (ref 0.0–38.6)

## 2023-02-25 DIAGNOSIS — H8112 Benign paroxysmal vertigo, left ear: Secondary | ICD-10-CM | POA: Diagnosis not present

## 2023-03-11 DIAGNOSIS — E785 Hyperlipidemia, unspecified: Secondary | ICD-10-CM | POA: Diagnosis not present

## 2023-03-11 DIAGNOSIS — E1169 Type 2 diabetes mellitus with other specified complication: Secondary | ICD-10-CM | POA: Diagnosis not present

## 2023-03-11 DIAGNOSIS — T451X5A Adverse effect of antineoplastic and immunosuppressive drugs, initial encounter: Secondary | ICD-10-CM | POA: Diagnosis not present

## 2023-03-11 DIAGNOSIS — Z96651 Presence of right artificial knee joint: Secondary | ICD-10-CM | POA: Diagnosis not present

## 2023-03-11 DIAGNOSIS — G620 Drug-induced polyneuropathy: Secondary | ICD-10-CM | POA: Diagnosis not present

## 2023-03-11 DIAGNOSIS — I700 Atherosclerosis of aorta: Secondary | ICD-10-CM | POA: Diagnosis not present

## 2023-03-15 ENCOUNTER — Other Ambulatory Visit: Payer: Self-pay | Admitting: Family Medicine

## 2023-03-15 ENCOUNTER — Other Ambulatory Visit: Payer: Self-pay | Admitting: Gastroenterology

## 2023-03-15 DIAGNOSIS — E1169 Type 2 diabetes mellitus with other specified complication: Secondary | ICD-10-CM

## 2023-03-15 DIAGNOSIS — K219 Gastro-esophageal reflux disease without esophagitis: Secondary | ICD-10-CM

## 2023-03-15 DIAGNOSIS — R809 Proteinuria, unspecified: Secondary | ICD-10-CM

## 2023-03-16 NOTE — Telephone Encounter (Signed)
Rx metformin- 01/15/23 #180 1RF- too soon Lisinopril expired- but OV 10/29/22 states continue so will RF Requested Prescriptions  Pending Prescriptions Disp Refills   metFORMIN (GLUCOPHAGE) 500 MG tablet [Pharmacy Med Name: METFORMIN HCL 500 MG TABLET] 180 tablet 1    Sig: TAKE 1 TABLET BY MOUTH TWICE A DAY WITH FOOD     Endocrinology:  Diabetes - Biguanides Failed - 03/15/2023  9:02 AM      Failed - Cr in normal range and within 360 days    Creatinine  Date Value Ref Range Status  02/18/2023 1.91 (H) 0.44 - 1.00 mg/dL Final   Creat  Date Value Ref Range Status  10/20/2021 0.85 0.50 - 1.05 mg/dL Final   Creatinine, Urine  Date Value Ref Range Status  10/29/2022 131 20 - 275 mg/dL Final         Failed - eGFR in normal range and within 360 days    GFR, Est African American  Date Value Ref Range Status  10/15/2020 72 > OR = 60 mL/min/1.8m2 Final   GFR, Est Non African American  Date Value Ref Range Status  10/15/2020 62 > OR = 60 mL/min/1.41m2 Final   GFR, Estimated  Date Value Ref Range Status  02/18/2023 28 (L) >60 mL/min Final    Comment:    (NOTE) Calculated using the CKD-EPI Creatinine Equation (2021)    GFR  Date Value Ref Range Status  12/28/2019 62.40 >60.00 mL/min Final   eGFR  Date Value Ref Range Status  10/20/2021 74 > OR = 60 mL/min/1.77m2 Final    Comment:    The eGFR is based on the CKD-EPI 2021 equation. To calculate  the new eGFR from a previous Creatinine or Cystatin C result, go to https://www.kidney.org/professionals/ kdoqi/gfr%5Fcalculator          Failed - B12 Level in normal range and within 720 days    Vitamin B-12  Date Value Ref Range Status  10/20/2021 >2,000 (H) 200 - 1,100 pg/mL Final         Passed - HBA1C is between 0 and 7.9 and within 180 days    HB A1C (BAYER DCA - WAIVED)  Date Value Ref Range Status  07/13/2017 6.5 <7.0 % Final    Comment:                                          Diabetic Adult            <7.0                                        Healthy Adult        4.3 - 5.7                                                           (DCCT/NGSP) American Diabetes Association's Summary of Glycemic Recommendations for Adults with Diabetes: Hemoglobin A1c <7.0%. More stringent glycemic goals (A1c <6.0%) may further reduce complications at the cost of increased risk of hypoglycemia.    Hgb A1c MFr Bld  Date Value Ref Range Status  10/21/2022 5.8 (  H) <5.7 % of total Hgb Final    Comment:    For someone without known diabetes, a hemoglobin  A1c value between 5.7% and 6.4% is consistent with prediabetes and should be confirmed with a  follow-up test. . For someone with known diabetes, a value <7% indicates that their diabetes is well controlled. A1c targets should be individualized based on duration of diabetes, age, comorbid conditions, and other considerations. . This assay result is consistent with an increased risk of diabetes. . Currently, no consensus exists regarding use of hemoglobin A1c for diagnosis of diabetes for children. Verna Czech - Valid encounter within last 6 months    Recent Outpatient Visits           4 months ago Essential hypertension, benign   [place] [facility] [place], Netta Neat, DO   9 months ago Pruritic erythematous rash   [place] [facility] [place], Netta Neat, DO   10 months ago Type 2 diabetes mellitus with other specified complication, without long-term current use of insulin (HCC)   [place] [facility] [place], Netta Neat, DO   1 year ago Annual physical exam   [place] [facility] Smitty Cords, DO   1 year ago Chest wall pain   [place] Smitty Cords, DO       Future Appointments             In 1 month Althea Charon, Netta Neat, DO [place] [facility], [place]   In 6 months  Deirdre Evener, MD [facility]            Passed - CBC within normal limits and completed in the last 12 months    WBC  Date Value Ref Range Status  01/21/2023 5.0 4.0 - 10.5 K/uL Final   WBC Count  Date Value Ref Range Status  02/18/2023 4.2 4.0 - 10.5 K/uL Final   RBC  Date Value Ref Range Status  02/18/2023 2.53 (L) 3.87 - 5.11 MIL/uL Final   Hemoglobin  Date Value Ref Range Status  02/18/2023 9.1 (L) 12.0 - 15.0 g/dL Final  16/08/9603 54.0 11.1 - 15.9 g/dL Final   HCT  Date Value Ref Range Status  02/18/2023 27.4 (L) 36.0 - 46.0 % Final   Hematocrit  Date Value Ref Range Status  07/13/2017 46.7 (H) 34.0 - 46.6 % Final   MCHC  Date Value Ref Range Status  02/18/2023 33.2 30.0 - 36.0 g/dL Final   [place] [facility]  Date Value Ref Range Status  02/18/2023 36.0 (H) 26.0 - 34.0 pg Final   MCV  Date Value Ref Range Status  02/18/2023 108.3 (H) 80.0 - 100.0 fL Final  07/13/2017 94 79 - 97 fL Final  03/11/2015 93 80 - 100 fL Final   No results found for: "PLTCOUNTKUC", "LABPLAT", "POCPLA" RDW  Date Value Ref Range Status  02/18/2023 16.0 (H) 11.5 - 15.5 % Final  07/13/2017 13.1 12.3 - 15.4 % Final  03/11/2015 13.8 11.5 - 14.5 % Final          famotidine (PEPCID) 20 MG tablet [Pharmacy Med Name: FAMOTIDINE 20 MG TABLET] 180 tablet 1    Sig: TAKE 1 TABLET BY MOUTH TWICE A DAY BEFORE MEALS     Gastroenterology:  H2 Antagonists Passed - 03/15/2023  9:02 AM  Passed - Valid encounter within last 12 months    Recent Outpatient Visits           4 months ago Essential hypertension, benign   [place] [facility] [place] Smitty Cords, DO   9 months ago Pruritic erythematous rash   [place] [facility] [place], Netta Neat, DO   10 months ago Type 2 diabetes mellitus with other specified complication, without long-term current use of insulin [facility])   [place] [facility] Althea Charon,  Netta Neat, DO   1 year ago Annual physical exam   [place] [facility] Smitty Cords, DO   1 year ago Chest wall pain   [place] [facility] Smitty Cords, DO       Future Appointments             In 1 month Althea Charon, Netta Neat, DO [place] [facility], [place]   In 6 months Deirdre Evener, MD [place] [facility]             lisinopril (ZESTRIL) 5 MG tablet [Pharmacy Med Name: LISINOPRIL 5 MG TABLET] 90 tablet 0    Sig: TAKE 1 TABLET BY MOUTH EVERY DAY IN THE EVENING     Cardiovascular:  ACE Inhibitors Failed - 03/15/2023  9:02 AM      Failed - Cr in normal range and within 180 days    Creatinine  Date Value Ref Range Status  02/18/2023 1.91 (H) 0.44 - 1.00 mg/dL Final   Creat  Date Value Ref Range Status  10/20/2021 0.85 0.50 - 1.05 mg/dL Final   Creatinine, Urine  Date Value Ref Range Status  10/29/2022 131 20 - 275 mg/dL Final         Passed - K in normal range and within 180 days    Potassium  Date Value Ref Range Status  02/18/2023 4.1 3.5 - 5.1 mmol/L Final  03/11/2015 3.4 (L) mmol/L Final    Comment:    3.5-5.1 NOTE: New Reference Range  01/29/15          Passed - Patient is not pregnant      Passed - Last BP in normal range    BP Readings from Last 1 Encounters:  02/18/23 122/76         Passed - Valid encounter within last 6 months    Recent Outpatient Visits           4 months ago Essential hypertension, benign   [place] [facility] Smitty Cords, DO   9 months ago Pruritic erythematous rash   [place] [facility] Smitty Cords, DO   10 months ago Type 2 diabetes mellitus with other specified complication, without long-term current use of insulin [facility])   [place] [facility] Smitty Cords, DO   1 year ago Annual physical exam   [place] [facility] Smitty Cords, DO   1 year ago Chest wall pain   [place] [facility] Smitty Cords, DO       Future Appointments             In 1 month Althea Charon, Netta Neat, DO  [facility], [place]   In 6 months Deirdre Evener, MD [facility]

## 2023-03-18 ENCOUNTER — Inpatient Hospital Stay: Payer: Medicare Other | Admitting: Pharmacist

## 2023-03-18 ENCOUNTER — Telehealth: Payer: Self-pay

## 2023-03-18 ENCOUNTER — Other Ambulatory Visit: Payer: Self-pay

## 2023-03-18 ENCOUNTER — Inpatient Hospital Stay: Payer: Medicare Other

## 2023-03-18 ENCOUNTER — Inpatient Hospital Stay: Payer: Medicare Other | Attending: Oncology

## 2023-03-18 VITALS — BP 134/82 | HR 85 | Temp 96.6°F | Resp 17

## 2023-03-18 DIAGNOSIS — C50911 Malignant neoplasm of unspecified site of right female breast: Secondary | ICD-10-CM | POA: Insufficient documentation

## 2023-03-18 DIAGNOSIS — C50919 Malignant neoplasm of unspecified site of unspecified female breast: Secondary | ICD-10-CM

## 2023-03-18 DIAGNOSIS — Z122 Encounter for screening for malignant neoplasm of respiratory organs: Secondary | ICD-10-CM

## 2023-03-18 DIAGNOSIS — Z170 Estrogen receptor positive status [ER+]: Secondary | ICD-10-CM | POA: Diagnosis not present

## 2023-03-18 DIAGNOSIS — Z79899 Other long term (current) drug therapy: Secondary | ICD-10-CM | POA: Insufficient documentation

## 2023-03-18 DIAGNOSIS — C50912 Malignant neoplasm of unspecified site of left female breast: Secondary | ICD-10-CM | POA: Insufficient documentation

## 2023-03-18 DIAGNOSIS — Z5111 Encounter for antineoplastic chemotherapy: Secondary | ICD-10-CM | POA: Insufficient documentation

## 2023-03-18 LAB — CBC WITH DIFFERENTIAL/PLATELET
Abs Immature Granulocytes: 0.01 10*3/uL (ref 0.00–0.07)
Basophils Absolute: 0.1 10*3/uL (ref 0.0–0.1)
Basophils Relative: 2 %
Eosinophils Absolute: 0.1 10*3/uL (ref 0.0–0.5)
Eosinophils Relative: 3 %
HCT: 26.7 % — ABNORMAL LOW (ref 36.0–46.0)
Hemoglobin: 9 g/dL — ABNORMAL LOW (ref 12.0–15.0)
Immature Granulocytes: 0 %
Lymphocytes Relative: 32 %
Lymphs Abs: 1.3 10*3/uL (ref 0.7–4.0)
MCH: 36.6 pg — ABNORMAL HIGH (ref 26.0–34.0)
MCHC: 33.7 g/dL (ref 30.0–36.0)
MCV: 108.5 fL — ABNORMAL HIGH (ref 80.0–100.0)
Monocytes Absolute: 0.7 10*3/uL (ref 0.1–1.0)
Monocytes Relative: 18 %
Neutro Abs: 1.8 10*3/uL (ref 1.7–7.7)
Neutrophils Relative %: 45 %
Platelets: 184 10*3/uL (ref 150–400)
RBC: 2.46 MIL/uL — ABNORMAL LOW (ref 3.87–5.11)
RDW: 15.9 % — ABNORMAL HIGH (ref 11.5–15.5)
WBC: 4.1 10*3/uL (ref 4.0–10.5)
nRBC: 0 % (ref 0.0–0.2)

## 2023-03-18 LAB — CMP (CANCER CENTER ONLY)
ALT: 17 U/L (ref 0–44)
AST: 26 U/L (ref 15–41)
Albumin: 4.1 g/dL (ref 3.5–5.0)
Alkaline Phosphatase: 62 U/L (ref 38–126)
Anion gap: 9 (ref 5–15)
BUN: 29 mg/dL — ABNORMAL HIGH (ref 8–23)
CO2: 25 mmol/L (ref 22–32)
Calcium: 9.3 mg/dL (ref 8.9–10.3)
Chloride: 104 mmol/L (ref 98–111)
Creatinine: 2.04 mg/dL — ABNORMAL HIGH (ref 0.44–1.00)
GFR, Estimated: 26 mL/min — ABNORMAL LOW (ref >60)
Glucose, Bld: 118 mg/dL — ABNORMAL HIGH (ref 70–99)
Potassium: 4.3 mmol/L (ref 3.5–5.1)
Sodium: 138 mmol/L (ref 135–145)
Total Bilirubin: 0.3 mg/dL (ref 0.3–1.2)
Total Protein: 7.6 g/dL (ref 6.5–8.1)

## 2023-03-18 LAB — MAGNESIUM: Magnesium: 1.8 mg/dL (ref 1.7–2.4)

## 2023-03-18 MED ORDER — FULVESTRANT 250 MG/5ML IM SOSY
500.0000 mg | PREFILLED_SYRINGE | Freq: Once | INTRAMUSCULAR | Status: AC
  Administered 2023-03-18: 500 mg via INTRAMUSCULAR
  Filled 2023-03-18: qty 10

## 2023-03-18 NOTE — Progress Notes (Signed)
Oral Chemotherapy Clinic [facility]  [phone] [phone]  Patient Care Team: Smitty Cords, DO as PCP - General (Family Medicine) Drema Dallas, DO as Consulting Physician (Neurology) Jeralyn Ruths, MD as Consulting Physician (Oncology)   Name of the patient: Cassidy Norris  [id number]  28-Apr-1951   Date of visit: 03/18/23  HPI: Patient is a 72 y.o. female with recurrent breast cancer, ER/PR positive/HER2 negative. Currently treating with Kisqali (ribociclib) and fulvestrant. She started ribociclib on 01/08/22.   Reason for Consult: Oral chemotherapy follow-up for ribociclib therapy.   PAST MEDICAL HISTORY: Past Medical History:  Diagnosis Date   Anemia    Arthritis not really diagnosis but have pain   B12 deficiency 07/23/2016   Will check levels to determine if injections are needed again. Await results. Recent CBC at Onc WNL   Blood transfusion without reported diagnosis 1983 had a miscarriage/dnc   Cancer (HCC) 12/30/2013   Multifocal disease: pT1c,m, N0(isolated tumor cells) Er/ PR positive, Her 2 negative.   Cancer (HCC) 01/09/2014   Invasive lobular carcinoma, 2.2 cm, T2,N1 ER/PR positive, HER-2/neu negative   Cataract 2014?   Centrilobular emphysema (HCC) 10/27/2021   Complication of anesthesia 03/06/2022   Prolonged metabolism of Rocuronium during case.   Diabetes mellitus without complication (HCC)    Diffuse cystic mastopathy    Erosive esophagitis    Essential hypertension, benign 09/24/2017   Family history of malignant neoplasm of gastrointestinal tract 2012   Fractured elbow 08/28/2014   Gastroesophageal reflux disease    Hx of metabolic acidosis with increased anion gap    Increased heart rate    Multiple sclerosis (HCC)    Neuromuscular disorder (HCC)    neuropathy in bil feet - left is worse.   Obesity, unspecified    Peripheral neuropathy    Personal history of tobacco use, presenting hazards  to health    Restless leg syndrome    Sleep apnea    uses CPAP   Special screening for malignant neoplasms, colon    Squamous cell carcinoma of skin 08/24/2022   SCCIS - scalp, ED&C    HEMATOLOGY/ONCOLOGY HISTORY:  Oncology History Overview Note  1. Bilateral carcinoma of breast, February, 2015. Right breast status post radical mastectomy. T1 C. N0M0 (isolated tumor cells in one lymph node) multifocal invasive cancer. Stage IC, Left breast, Status post radical mastectomy, T2, N1, M0 tumor. Stage II, RIght breast. Both tumors are estrogen receptor positive.  Progesterone receptor positive.  HER-2 receptor negative by FISH. Negative for BRCA mutation.  2. Started on Adriamycin and Cytoxan on March 01, 2014. Last cycle of Cytoxan and Adriamycin on May 01, 2014. Patient has finished last chemotherapy on September 9. (12 th dose was omitted because of neuropathy) 3.  Taking tamoxifen.  Patient had a poor tolerance to letrozole.(October, 2015) 4.  Patient is taking letrozole since November of 2015 5.  May, 2016 Letrozole has been put on hold because of bony pains 6.  Started on Aromasin from July of 2016    History of bilateral breast cancer (Resolved)  11/29/2013 Initial Diagnosis   Breast cancer bilateral, multifocal ER/PR pos, Her 2 neg,.     ALLERGIES:  has No Known Allergies.  MEDICATIONS:  Current Outpatient Medications  Medication Sig Dispense Refill   acetaminophen (TYLENOL) 500 MG tablet Take 1,000 mg by mouth 2 (two) times daily as needed for moderate pain or headache.     baclofen (LIORESAL) 10 MG tablet Take 1 tablet (  10 mg total) by mouth 3 (three) times daily as needed for muscle spasms. 1080 tablet 0   calcium carbonate (OSCAL) 1500 (600 Ca) MG TABS tablet Take by mouth 2 (two) times daily with a meal.     cetirizine (ZYRTEC) 10 MG chewable tablet Chew 10 mg by mouth daily.     diclofenac Sodium (VOLTAREN) 1 % GEL Apply 2 g topically 3 (three) times daily as needed. 100  g 2   famotidine (PEPCID) 20 MG tablet TAKE 1 TABLET BY MOUTH TWICE A DAY BEFORE MEALS 180 tablet 1   fluticasone (FLONASE) 50 MCG/ACT nasal spray PLACE 2 SPRAYS INTO BOTH NOSTRILS AT BEDTIME. 48 g 1   fulvestrant (FASLODEX) 250 MG/5ML injection Inject 500 mg into the muscle every 30 (thirty) days. One injection each buttock over 1-2 minutes. Warm prior to use.     Krill Oil 1000 MG CAPS Take 1,000 mg by mouth daily.     lisinopril (ZESTRIL) 5 MG tablet TAKE 1 TABLET BY MOUTH EVERY DAY IN THE EVENING 90 tablet 0   loperamide (IMODIUM A-D) 2 MG tablet Take 2 tablets (4 mg total) by mouth 4 (four) times daily as needed for diarrhea or loose stools. 30 tablet 0   metFORMIN (GLUCOPHAGE) 500 MG tablet TAKE 1 TABLET BY MOUTH TWICE A DAY WITH MEALS 180 tablet 1   mometasone (ELOCON) 0.1 % cream Apply to affected skin qd-bid prn flares 45 g 2   omeprazole (PRILOSEC) 40 MG capsule Take 1 capsule (40 mg total) by mouth 2 (two) times daily before a meal. 60 capsule 0   ondansetron (ZOFRAN) 8 MG tablet Take 1 tablet (8 mg total) by mouth every 8 (eight) hours as needed for nausea or vomiting. 20 tablet 0   prochlorperazine (COMPAZINE) 10 MG tablet Take 1 tablet (10 mg total) by mouth every 6 (six) hours as needed for nausea or vomiting. 20 tablet 1   ribociclib succ (KISQALI, 600 MG DOSE,) 200 MG Therapy Pack Take 3 tablets (600 mg total) by mouth daily. Take for 21 days on, 7 days off, repeat every 28 days. 63 tablet 5   sodium chloride (OCEAN) 0.65 % SOLN nasal spray Place 1 spray into both nostrils as needed for congestion.     traZODone (DESYREL) 50 MG tablet Take 1 tablet (50 mg total) by mouth at bedtime as needed. for sleep 90 tablet 3   venlafaxine XR (EFFEXOR-XR) 75 MG 24 hr capsule Take 1 capsule (75 mg total) by mouth daily with breakfast. 360 capsule 0   vitamin B-12 (CYANOCOBALAMIN) 1000 MCG tablet Take 2,500 mcg by mouth every Monday, Wednesday, and Friday.     Vitamin D, Cholecalciferol, 50 MCG  (2000 UT) CAPS Take 2,000 Units by mouth every Monday, Wednesday, and Friday.     No current facility-administered medications for this visit.    VITAL SIGNS: BP 134/82 (Patient Position: Sitting)   Pulse 85   Temp (!) 96.6 F (35.9 C) (Tympanic)   Resp 17   LMP 12/22/1999 (Approximate)   SpO2 99%  There were no vitals filed for this visit.   Estimated body mass index is 31.28 kg/m as calculated from the following:   Height as of 02/18/23: 5\' 5"  (1.651 m).   Weight as of 02/18/23: 85.3 kg (188 lb).  LABS: CBC:    Component Value Date/Time   WBC 4.1 03/18/2023 1035   HGB 9.0 (L) 03/18/2023 1035   HGB 9.1 (L) 02/18/2023 0944   HGB 15.5  07/13/2017 0937   HCT 26.7 (L) 03/18/2023 1035   HCT 46.7 (H) 07/13/2017 0937   PLT 184 03/18/2023 1035   PLT 211 02/18/2023 0944   PLT 211 07/13/2017 0937   MCV 108.5 (H) 03/18/2023 1035   MCV 94 07/13/2017 0937   MCV 93 03/11/2015 1356   NEUTROABS 1.8 03/18/2023 1035   NEUTROABS 5.4 07/13/2017 0937   NEUTROABS 5.6 03/11/2015 1356   LYMPHSABS 1.3 03/18/2023 1035   LYMPHSABS 2.1 07/13/2017 0937   LYMPHSABS 2.3 03/11/2015 1356   MONOABS 0.7 03/18/2023 1035   MONOABS 1.0 (H) 03/11/2015 1356   EOSABS 0.1 03/18/2023 1035   EOSABS 0.2 07/13/2017 0937   EOSABS 0.3 03/11/2015 1356   BASOSABS 0.1 03/18/2023 1035   BASOSABS 0.0 07/13/2017 0937   BASOSABS 0.1 03/11/2015 1356   Comprehensive Metabolic Panel:    Component Value Date/Time   NA 138 03/18/2023 1035   NA 142 07/14/2019 1516   NA 139 03/11/2015 1356   K 4.3 03/18/2023 1035   K 3.4 (L) 03/11/2015 1356   CL 104 03/18/2023 1035   CL 103 03/11/2015 1356   CO2 25 03/18/2023 1035   CO2 28 03/11/2015 1356   BUN 29 (H) 03/18/2023 1035   BUN 12 07/14/2019 1516   BUN 13 03/11/2015 1356   CREATININE 2.04 (H) 03/18/2023 1035   CREATININE 0.85 10/20/2021 0838   GLUCOSE 118 (H) 03/18/2023 1035   GLUCOSE 118 (H) 03/11/2015 1356   CALCIUM 9.3 03/18/2023 1035   CALCIUM 9.2 03/11/2015  1356   AST 26 03/18/2023 1035   ALT 17 03/18/2023 1035   ALT 32 03/11/2015 1356   ALKPHOS 62 03/18/2023 1035   ALKPHOS 68 03/11/2015 1356   BILITOT 0.3 03/18/2023 1035   PROT 7.6 03/18/2023 1035   PROT 6.9 07/14/2019 1516   PROT 6.9 03/11/2015 1356   ALBUMIN 4.1 03/18/2023 1035   ALBUMIN 4.5 07/14/2019 1516   ALBUMIN 4.2 03/11/2015 1356     Present during today's visit: patient only  Assessment and Plan: CBC and CMP assesed with pateint, continue ribociclib  21 days on/ 7 days off.  Patient will receive fulvestrant today.  SCr was elevated continues to increase, referral being sent to nephrology Will continue to monitor SCr, if CrCl decreases for <30 ml/min, patient will need to dose reduce to    Oral Chemotherapy Side Effect/Intolerance:  Nausea/vomiting: occasional, patient thinks this is more so related to issues she is having with swallowing. She plans to f/u with her PCP.   Oral Chemotherapy Adherence: No missed doses reported There are no patient barriers to medication adherence identified.   New medications: None reported  Medication Access Issues: She has been approved for 2024 [employer] patient assistance. After issues with shipments at the beginning of the year, she now reports receiving her medication on time.  Patient expressed understanding and was in agreement with this plan. She also understands that She can call clinic at any time with any questions, concerns, or complaints.   Follow-up plan: RTC in 4 weeks.   Thank you for allowing me to participate in the care of this very pleasant patient.   Time Total: 20 minutes  Visit consisted of counseling and education on dealing with issues of symptom management in the setting of serious and potentially life-threatening illness.Greater than 50%  of this time was spent counseling and coordinating care related to the above assessment and plan.  Signed by: Remi Haggard, PharmD, BCPS, BCOP,  CPP Hematology/Oncology Clinical  Pharmacist Practitioner [place]/DB/AP Oral Chemotherapy Navigation Clinic [phone]  03/18/2023 1:12 PM

## 2023-03-18 NOTE — Telephone Encounter (Signed)
Faxed referral to Nephology for creatinine levels

## 2023-03-19 ENCOUNTER — Ambulatory Visit (INDEPENDENT_AMBULATORY_CARE_PROVIDER_SITE_OTHER): Payer: Medicare Other | Admitting: Family Medicine

## 2023-03-19 ENCOUNTER — Encounter: Payer: Self-pay | Admitting: Family Medicine

## 2023-03-19 VITALS — BP 133/75 | HR 108 | Ht 65.0 in | Wt 191.0 lb

## 2023-03-19 DIAGNOSIS — L720 Epidermal cyst: Secondary | ICD-10-CM

## 2023-03-19 NOTE — Progress Notes (Signed)
CC: Knots on labia x a while, Pt starting to notice pain in the knots while urination Pt has Breast cancer(metastatic cancer)

## 2023-03-19 NOTE — Progress Notes (Signed)
    Subjective:    Patient ID: Cassidy Norris is a 72 y.o. female presenting with Gynecologic Exam  on 03/19/2023  HPI: Started kiqali and filbestrin and has noticed bumps on vagina. This started 2/23 and they have gotten bigger and are on the left and toward the back. Feels like 2 on the inside and 3rd on the outside. They seem tender with siping. She feels uncomfortable.   Review of Systems  Constitutional:  Negative for chills and fever.  Respiratory:  Negative for shortness of breath.   Cardiovascular:  Negative for chest pain.  Gastrointestinal:  Negative for abdominal pain, nausea and vomiting.  Genitourinary:  Negative for dysuria.  Skin:  Negative for rash.      Objective:    BP 133/75   Pulse (!) 108   Ht 5\' 5"  (1.651 m)   Wt 191 lb (86.6 kg)   LMP 12/22/1999 (Approximate)   BMI 31.78 kg/m  Physical Exam Exam conducted with a chaperone present.  Constitutional:      General: She is not in acute distress.    Appearance: She is well-developed.  HENT:     Head: Normocephalic and atraumatic.  Eyes:     General: No scleral icterus. Cardiovascular:     Rate and Rhythm: Normal rate.  Pulmonary:     Effort: Pulmonary effort is normal.  Abdominal:     Palpations: Abdomen is soft.  Genitourinary:    Comments: Small inclusions noted on both labia minora. Left one is about 0.75 cm. On right are several that are sub 0.25 cm. Musculoskeletal:     Cervical back: Neck supple.  Skin:    General: Skin is warm and dry.  Neurological:     Mental Status: She is alert and oriented to person, place, and time.         Assessment & Plan:   Inclusion cyst - Of the vulva--not inflammed. No treatment is necessary.  Return in about 1 year (around 03/18/2024), or if symptoms worsen or fail to improve.  Reva Bores, MD 03/19/2023 11:20 AM

## 2023-03-23 LAB — CANCER ANTIGEN 27.29: CA 27.29: 88.7 U/mL — ABNORMAL HIGH (ref 0.0–38.6)

## 2023-03-26 NOTE — Telephone Encounter (Signed)
[facility] to follow up on referral. No answer. LVM for them to call back.

## 2023-04-06 ENCOUNTER — Other Ambulatory Visit: Payer: Self-pay | Admitting: Gastroenterology

## 2023-04-09 ENCOUNTER — Other Ambulatory Visit: Payer: Self-pay | Admitting: Pharmacist

## 2023-04-09 DIAGNOSIS — C50919 Malignant neoplasm of unspecified site of unspecified female breast: Secondary | ICD-10-CM

## 2023-04-09 MED ORDER — RIBOCICLIB SUCC (600 MG DOSE) 200 MG PO TBPK: 600.0000 mg | ORAL_TABLET | Freq: Every day | ORAL | 5 refills | Status: DC

## 2023-04-15 ENCOUNTER — Encounter: Payer: Self-pay | Admitting: Oncology

## 2023-04-15 ENCOUNTER — Inpatient Hospital Stay: Payer: Medicare Other | Attending: Oncology

## 2023-04-15 ENCOUNTER — Inpatient Hospital Stay: Payer: Medicare Other

## 2023-04-15 ENCOUNTER — Inpatient Hospital Stay (HOSPITAL_BASED_OUTPATIENT_CLINIC_OR_DEPARTMENT_OTHER): Payer: Medicare Other | Admitting: Oncology

## 2023-04-15 VITALS — BP 131/81 | HR 101 | Temp 98.3°F | Resp 16 | Ht 65.0 in | Wt 190.0 lb

## 2023-04-15 DIAGNOSIS — C50919 Malignant neoplasm of unspecified site of unspecified female breast: Secondary | ICD-10-CM

## 2023-04-15 DIAGNOSIS — Z79811 Long term (current) use of aromatase inhibitors: Secondary | ICD-10-CM | POA: Diagnosis not present

## 2023-04-15 DIAGNOSIS — M8580 Other specified disorders of bone density and structure, unspecified site: Secondary | ICD-10-CM | POA: Diagnosis not present

## 2023-04-15 DIAGNOSIS — Z5111 Encounter for antineoplastic chemotherapy: Secondary | ICD-10-CM | POA: Diagnosis not present

## 2023-04-15 DIAGNOSIS — Z170 Estrogen receptor positive status [ER+]: Secondary | ICD-10-CM | POA: Diagnosis not present

## 2023-04-15 DIAGNOSIS — Z79899 Other long term (current) drug therapy: Secondary | ICD-10-CM | POA: Insufficient documentation

## 2023-04-15 DIAGNOSIS — C50912 Malignant neoplasm of unspecified site of left female breast: Secondary | ICD-10-CM | POA: Diagnosis not present

## 2023-04-15 DIAGNOSIS — Z87891 Personal history of nicotine dependence: Secondary | ICD-10-CM | POA: Diagnosis not present

## 2023-04-15 DIAGNOSIS — Z9013 Acquired absence of bilateral breasts and nipples: Secondary | ICD-10-CM | POA: Diagnosis not present

## 2023-04-15 DIAGNOSIS — C50911 Malignant neoplasm of unspecified site of right female breast: Secondary | ICD-10-CM | POA: Insufficient documentation

## 2023-04-15 LAB — CBC WITH DIFFERENTIAL/PLATELET
Abs Immature Granulocytes: 0.01 10*3/uL (ref 0.00–0.07)
Basophils Absolute: 0.1 10*3/uL (ref 0.0–0.1)
Basophils Relative: 3 %
Eosinophils Absolute: 0.1 10*3/uL (ref 0.0–0.5)
Eosinophils Relative: 2 %
HCT: 25.7 % — ABNORMAL LOW (ref 36.0–46.0)
Hemoglobin: 8.7 g/dL — ABNORMAL LOW (ref 12.0–15.0)
Immature Granulocytes: 0 %
Lymphocytes Relative: 28 %
Lymphs Abs: 1.2 10*3/uL (ref 0.7–4.0)
MCH: 36.9 pg — ABNORMAL HIGH (ref 26.0–34.0)
MCHC: 33.9 g/dL (ref 30.0–36.0)
MCV: 108.9 fL — ABNORMAL HIGH (ref 80.0–100.0)
Monocytes Absolute: 0.8 10*3/uL (ref 0.1–1.0)
Monocytes Relative: 19 %
Neutro Abs: 2.1 10*3/uL (ref 1.7–7.7)
Neutrophils Relative %: 48 %
Platelets: 185 10*3/uL (ref 150–400)
RBC: 2.36 MIL/uL — ABNORMAL LOW (ref 3.87–5.11)
RDW: 15.4 % (ref 11.5–15.5)
WBC: 4.3 10*3/uL (ref 4.0–10.5)
nRBC: 0 % (ref 0.0–0.2)

## 2023-04-15 LAB — CMP (CANCER CENTER ONLY)
ALT: 14 U/L (ref 0–44)
AST: 26 U/L (ref 15–41)
Albumin: 4.1 g/dL (ref 3.5–5.0)
Alkaline Phosphatase: 66 U/L (ref 38–126)
Anion gap: 10 (ref 5–15)
BUN: 26 mg/dL — ABNORMAL HIGH (ref 8–23)
CO2: 26 mmol/L (ref 22–32)
Calcium: 8.9 mg/dL (ref 8.9–10.3)
Chloride: 103 mmol/L (ref 98–111)
Creatinine: 1.71 mg/dL — ABNORMAL HIGH (ref 0.44–1.00)
GFR, Estimated: 32 mL/min — ABNORMAL LOW (ref >60)
Glucose, Bld: 110 mg/dL — ABNORMAL HIGH (ref 70–99)
Potassium: 3.9 mmol/L (ref 3.5–5.1)
Sodium: 139 mmol/L (ref 135–145)
Total Bilirubin: 0.6 mg/dL (ref 0.3–1.2)
Total Protein: 7.5 g/dL (ref 6.5–8.1)

## 2023-04-15 LAB — MAGNESIUM: Magnesium: 1.9 mg/dL (ref 1.7–2.4)

## 2023-04-15 MED ORDER — FULVESTRANT 250 MG/5ML IM SOSY
500.0000 mg | PREFILLED_SYRINGE | Freq: Once | INTRAMUSCULAR | Status: AC
  Administered 2023-04-15: 500 mg via INTRAMUSCULAR
  Filled 2023-04-15: qty 10

## 2023-04-15 NOTE — Progress Notes (Signed)
[facility]  [phone]  [phone]     Cassidy Norris DOB: 1951/09/18  MR#: [id number]  AOZ#:[id number]  Patient Care Team: Smitty Cords, DO as PCP - General (Family Medicine) Drema Dallas, DO as Consulting Physician (Neurology) Jeralyn Ruths, MD as Consulting Physician (Oncology)  CHIEF COMPLAINT: Recurrent, metastatic ER/PR, HER2 negative invasive carcinoma of the breast.    INTERVAL HISTORY: Patient returns to clinic today for repeat laboratory work, further evaluation, and continuation of Kisqali and fulvestrant.  She continues to feel well and remains asymptomatic.  She is tolerating her treatments without significant side effects.  She has no new neurologic complaints.  She denies any recent fevers or illnesses. She has a good appetite and denies weight loss.  She has no chest pain, shortness of breath, cough, or hemoptysis.  She denies any nausea, vomiting, or constipation. She has no urinary complaints.  Patient offers no specific complaints today.  REVIEW OF SYSTEMS:   Review of Systems  Constitutional: Negative.  Negative for fever, malaise/fatigue and weight loss.  Respiratory: Negative.  Negative for cough, hemoptysis and shortness of breath.   Cardiovascular: Negative.  Negative for chest pain and leg swelling.  Gastrointestinal: Negative.  Negative for abdominal pain.  Genitourinary: Negative.  Negative for dysuria.  Musculoskeletal: Negative.  Negative for joint pain and myalgias.  Skin: Negative.  Negative for rash.  Neurological: Negative.  Negative for dizziness, sensory change, focal weakness, weakness and headaches.  Psychiatric/Behavioral: Negative.  The patient is not nervous/anxious.     As per HPI. Otherwise, a complete review of systems is negative.  ONCOLOGY HISTORY: Oncology History Overview Note  1. Bilateral carcinoma of breast, February, 2015. Right breast status post radical mastectomy. T1 C.  N0M0 (isolated tumor cells in one lymph node) multifocal invasive cancer. Stage IC, Left breast, Status post radical mastectomy, T2, N1, M0 tumor. Stage II, RIght breast. Both tumors are estrogen receptor positive.  Progesterone receptor positive.  HER-2 receptor negative by FISH. Negative for BRCA mutation.  2. Started on Adriamycin and Cytoxan on March 01, 2014. Last cycle of Cytoxan and Adriamycin on May 01, 2014. Patient has finished last chemotherapy on September 9. (12 th dose was omitted because of neuropathy) 3.  Taking tamoxifen.  Patient had a poor tolerance to letrozole.(October, 2015) 4.  Patient is taking letrozole since November of 2015 5.  May, 2016 Letrozole has been put on hold because of bony pains 6.  Started on Aromasin from July of 2016    History of bilateral breast cancer (Resolved)  11/29/2013 Initial Diagnosis   Breast cancer bilateral, multifocal ER/PR pos, Her 2 neg,.     PAST MEDICAL HISTORY: Past Medical History:  Diagnosis Date   Anemia    Arthritis not really diagnosis but have pain   B12 deficiency 07/23/2016   Will check levels to determine if injections are needed again. Await results. Recent CBC at Onc WNL   Blood transfusion without reported diagnosis 1983 had a miscarriage/dnc   Cancer (HCC) 12/30/2013   Multifocal disease: pT1c,m, N0(isolated tumor cells) Er/ PR positive, Her 2 negative.   Cancer (HCC) 01/09/2014   Invasive lobular carcinoma, 2.2 cm, T2,N1 ER/PR positive, HER-2/neu negative   Cataract 2014?   Centrilobular emphysema (HCC) 10/27/2021   Complication of anesthesia 03/06/2022   Prolonged metabolism of Rocuronium during case.   Diabetes mellitus without complication (HCC)    Diffuse cystic mastopathy    Erosive esophagitis    Essential hypertension,  benign 09/24/2017   Family history of malignant neoplasm of gastrointestinal tract 2012   Fractured elbow 08/28/2014   Gastroesophageal reflux disease    Hx of metabolic acidosis  with increased anion gap    Increased heart rate    Multiple sclerosis (HCC)    Neuromuscular disorder (HCC)    neuropathy in bil feet - left is worse.   Obesity, unspecified    Peripheral neuropathy    Personal history of tobacco use, presenting hazards to health    Restless leg syndrome    Sleep apnea    uses CPAP   Special screening for malignant neoplasms, colon    Squamous cell carcinoma of skin 08/24/2022   SCCIS - scalp, ED&C    PAST SURGICAL HISTORY: Past Surgical History:  Procedure Laterality Date   BREAST BIOPSY Right 1973,2001   BREAST BIOPSY Right 11/07/2013   INVASIVE MAMMARY CARCINOMA , ER/PR positive Her2 negative   BREAST BIOPSY Left 11/27/2013   invasive lobular and DCIS   BREAST SURGERY Bilateral 01/09/2014   mastectomy   cataract surgery Left 08/13/2022   CHOLECYSTECTOMY     COLONOSCOPY  2010   Dr. Evette Cristal   COLONOSCOPY WITH PROPOFOL N/A 06/11/2015   Procedure: COLONOSCOPY WITH PROPOFOL;  Surgeon: Kieth Brightly, MD;  Location: [facility] ENDOSCOPY;  Service: Endoscopy;  Laterality: N/A;   COLONOSCOPY WITH PROPOFOL N/A 12/23/2021   Procedure: COLONOSCOPY WITH PROPOFOL;  Surgeon: Toney Reil, MD;  Location: [facility] ENDOSCOPY;  Service: Gastroenterology;  Laterality: N/A;   DILATATION & CURETTAGE/HYSTEROSCOPY WITH MYOSURE N/A 07/12/2018   Procedure: DILATATION & CURETTAGE/HYSTEROSCOPY WITH MYOSURE WITH ENDOMETRIAL POLYPECTOMY;  Surgeon: Conard Novak, MD;  Location: [facility] ORS;  Service: Gynecology;  Laterality: N/A;   DILATION AND CURETTAGE OF UTERUS  1983   ESOPHAGOGASTRODUODENOSCOPY N/A 12/23/2021   Procedure: ESOPHAGOGASTRODUODENOSCOPY (EGD);  Surgeon: Toney Reil, MD;  Location[facility] END[facility]COPY;  Service: Gastroenterology;  Laterality: N/A;   JOINT REPLACEMENT  11/23/2015   KNEE ARTHROPLASTY Right 03/06/2022   Procedure: COMPUTER ASSISTED TOTAL KNEE ARTHROPLASTY;  Surgeon: Donato Heinz, MD;  Location: [facility] ORS;  Service: Orthopedics;   Laterality: Right;   POLYPECTOMY  1989   PORTA CATH REMOVAL     PORTACATH PLACEMENT  2015   SKIN BIOPSY     on scalp   SPINE SURGERY  09/14/2014   Ruptured disk L4- L5   TOTAL KNEE ARTHROPLASTY Left 11/22/2015   Procedure: TOTAL KNEE ARTHROPLASTY;  Surgeon: Frederico Hamman, MD;  Location: [facility] OR;  Service: Orthopedics;  Laterality: Left;   TUBAL LIGATION     WISDOM TOOTH EXTRACTION  2006    FAMILY HISTORY Family History  Problem Relation Age of Onset   Cancer Mother        lung   COPD Mother    Cancer Father        colon, stomach   Diabetes Maternal Grandmother    Hypertension Sister    Rectal cancer Paternal Uncle    Rectal cancer Paternal Uncle     GYNECOLOGIC HISTORY:  Patient's last menstrual period was 12/22/1999 (approximate).     ADVANCED DIRECTIVES:    HEALTH MAINTENANCE: Social History   Tobacco Use   Smoking status: Former    Packs/day: 1.00    Years: 30.00    Additional pack years: 0.00    Total pack years: 30.00    Types: Cigarettes    Quit date: 12/11/2013    Years since quitting: 9.3   Smokeless tobacco: Former  Advertising account planner  Vaping Use: Never used  Substance Use Topics   Alcohol use: Not Currently   Drug use: No     Colonoscopy:  PAP:  Bone density:  Mammogram:  No Known Allergies  Current Outpatient Medications  Medication Sig Dispense Refill   acetaminophen (TYLENOL) 500 MG tablet Take 1,000 mg by mouth 2 (two) times daily as needed for moderate pain or headache.     baclofen (LIORESAL) 10 MG tablet Take 1 tablet (10 mg total) by mouth 3 (three) times daily as needed for muscle spasms. 1080 tablet 0   calcium carbonate (OSCAL) 1500 (600 Ca) MG TABS tablet Take by mouth 2 (two) times daily with a meal.     diclofenac Sodium (VOLTAREN) 1 % GEL Apply 2 g topically 3 (three) times daily as needed. 100 g 2   famotidine (PEPCID) 20 MG tablet TAKE 1 TABLET BY MOUTH TWICE A DAY BEFORE MEALS 180 tablet 1   fluticasone (FLONASE) 50 MCG/ACT nasal  spray PLACE 2 SPRAYS INTO BOTH NOSTRILS AT BEDTIME. 48 g 1   fulvestrant (FASLODEX) 250 MG/5ML injection Inject 500 mg into the muscle every 30 (thirty) days. One injection each buttock over 1-2 minutes. Warm prior to use.     Krill Oil 1000 MG CAPS Take 1,000 mg by mouth daily.     lisinopril (ZESTRIL) 5 MG tablet TAKE 1 TABLET BY MOUTH EVERY DAY IN THE EVENING 90 tablet 0   loperamide (IMODIUM A-D) 2 MG tablet Take 2 tablets (4 mg total) by mouth 4 (four) times daily as needed for diarrhea or loose stools. 30 tablet 0   metFORMIN (GLUCOPHAGE) 500 MG tablet TAKE 1 TABLET BY MOUTH TWICE A DAY WITH MEALS 180 tablet 1   mometasone (ELOCON) 0.1 % cream Apply to affected skin qd-bid prn flares 45 g 2   ondansetron (ZOFRAN) 8 MG tablet Take 1 tablet (8 mg total) by mouth every 8 (eight) hours as needed for nausea or vomiting. 20 tablet 0   prochlorperazine (COMPAZINE) 10 MG tablet Take 1 tablet (10 mg total) by mouth every 6 (six) hours as needed for nausea or vomiting. 20 tablet 1   ribociclib succ (KISQALI, 600 MG DOSE,) 200 MG Therapy Pack Take 3 tablets (600 mg total) by mouth daily. Take for 21 days on, 7 days off, repeat every 28 days. 63 tablet 5   sodium chloride (OCEAN) 0.65 % SOLN nasal spray Place 1 spray into both nostrils as needed for congestion.     traZODone (DESYREL) 50 MG tablet Take 1 tablet (50 mg total) by mouth at bedtime as needed. for sleep 90 tablet 3   venlafaxine XR (EFFEXOR-XR) 75 MG 24 hr capsule Take 1 capsule (75 mg total) by mouth daily with breakfast. 360 capsule 0   vitamin B-12 (CYANOCOBALAMIN) 1000 MCG tablet Take 2,500 mcg by mouth every Monday, Wednesday, and Friday.     Vitamin D, Cholecalciferol, 50 MCG (2000 UT) CAPS Take 2,000 Units by mouth every Monday, Wednesday, and Friday.     omeprazole (PRILOSEC) 40 MG capsule Take 1 capsule (40 mg total) by mouth 2 (two) times daily before a meal. 60 capsule 0   No current facility-administered medications for this visit.     OBJECTIVE: BP 131/81 (BP Location: Left Arm, Patient Position: Sitting, Cuff Size: Normal)   Pulse (!) 101   Temp 98.3 F (36.8 C) (Tympanic)   Resp 16   Ht 5\' 5"  (1.651 m)   Wt 190 lb (86.2 kg)  LMP 12/22/1999 (Approximate)   SpO2 99%   BMI 31.62 kg/m    Body mass index is 31.62 kg/m.    ECOG FS:0 - Asymptomatic  General: Well-developed, well-nourished, no acute distress. Eyes: Pink conjunctiva, anicteric sclera. HEENT: Normocephalic, moist mucous membranes. Lungs: No audible wheezing or coughing. Heart: Regular rate and rhythm. Abdomen: Soft, nontender, no obvious distention. Musculoskeletal: No edema, cyanosis, or clubbing. Neuro: Alert, answering all questions appropriately. Cranial nerves grossly intact. Skin: No rashes or petechiae noted. Psych: Normal affect.  LAB RESULTS:  Appointment on 04/15/2023  Component Date Value Ref Range Status   Magnesium 04/15/2023 1.9  1.7 - 2.4 mg/dL Final   Performed at [facility], [address] [place], [place] [address]   Sodium 04/15/2023 139  135 - 145 mmol/L Final   Potassium 04/15/2023 3.9  3.5 - 5.1 mmol/L Final   Chloride 04/15/2023 103  98 - 111 mmol/L Final   CO2 04/15/2023 26  22 - 32 mmol/L Final   Glucose, Bld 04/15/2023 110 (H)  70 - 99 mg/dL Final   Glucose reference range applies only to samples taken after fasting for at least 8 hours.   BUN 04/15/2023 26 (H)  8 - 23 mg/dL Final   Creatinine 57/84/6962 1.71 (H)  0.44 - 1.00 mg/dL Final   Calcium 95/28/4132 8.9  8.9 - 10.3 mg/dL Final   Total Protein 44/11/270 7.5  6.5 - 8.1 g/dL Final   Albumin 53/66/4403 4.1  3.5 - 5.0 g/dL Final   AST 47/42/5956 26  15 - 41 U/L Final   ALT 04/15/2023 14  0 - 44 U/L Final   Alkaline Phosphatase 04/15/2023 66  38 - 126 U/L Final   Total Bilirubin 04/15/2023 0.6  0.3 - 1.2 mg/dL Final   GFR, Estimated 04/15/2023 32 (L)  >60 mL/min Final   Comment: (NOTE) Calculated using the CKD-EPI Creatinine Equation (2021)     Anion gap 04/15/2023 10  5 - 15 Final   Performed at [facility], [address] [place], [place] [address]   WBC 04/15/2023 4.3  4.0 - 10.5 K/uL Final   RBC 04/15/2023 2.36 (L)  3.87 - 5.11 MIL/uL Final   Hemoglobin 04/15/2023 8.7 (L)  12.0 - 15.0 g/dL Final   HCT 43/32/9518 25.7 (L)  36.0 - 46.0 % Final   MCV 04/15/2023 108.9 (H)  80.0 - 100.0 fL Final   MCH 04/15/2023 36.9 (H)  26.0 - 34.0 pg Final   MCHC 04/15/2023 33.9  30.0 - 36.0 g/dL Final   RDW 84/16/6063 15.4  11.5 - 15.5 % Final   Platelets 04/15/2023 185  150 - 400 K/uL Final   nRBC 04/15/2023 0.0  0.0 - 0.2 % Final   Neutrophils Relative % 04/15/2023 48  % Final   Neutro Abs 04/15/2023 2.1  1.7 - 7.7 K/uL Final   Lymphocytes Relative 04/15/2023 28  % Final   Lymphs Abs 04/15/2023 1.2  0.7 - 4.0 K/uL Final   Monocytes Relative 04/15/2023 19  % Final   Monocytes Absolute 04/15/2023 0.8  0.1 - 1.0 K/uL Final   Eosinophils Relative 04/15/2023 2  % Final   Eosinophils Absolute 04/15/2023 0.1  0.0 - 0.5 K/uL Final   Basophils Relative 04/15/2023 3  % Final   Basophils Absolute 04/15/2023 0.1  0.0 - 0.1 K/uL Final   Immature Granulocytes 04/15/2023 0  % Final   Abs Immature Granulocytes 04/15/2023 0.01  0.00 - 0.07 K/uL Final   Performed at  [facility], [address] [place], [place] [address]    STUDIES: No results found.  ONCOLOGY HISTORY:  Bilateral adenocarcinoma of the breast. Patient is status post bilateral mastectomy in February 2015. She also received adjuvant chemotherapy with Adriamycin, Cytoxan, and Taxol completing treatment in approximately October 2015. She then initiated letrozole, but could not tolerate secondary to leg pain and was switched to Aromasin.  Patient then switched to tamoxifen in October 2019 secondary to cost.  Patient subsequently discontinued tamoxifen and did not complete the recommended 5 years of treatment.   ASSESSMENT: Recurrent, metastatic ER/PR, HER2 negative invasive  carcinoma of the breast.   PLAN:    Recurrent, metastatic ER/PR, HER2 negative invasive carcinoma of the breast: See oncology history as above.  Biopsy confirmed recurrent disease.  PET scan results from December 26, 2021 reviewed independently with hypermetabolic left supraclavicular, subpectoral, and axillary lymphadenopathy consistent with nodal recurrence of patient's history of breast cancer.  No distant metastases were identified.  Repeat PET scan on April 23, 2022 reviewed independently with significant improvement of patient's disease burden.  Her initial CA 27-29 initially was 260.1.  Now seems to have plateaued between 81.1 and 91.  Her most recent result is 88.7.  Continue Kisqali 600 mg daily for 21 days with 7 days off.  Proceed with fulvestrant today.  Return to clinic in 4 weeks for continuation of treatment and evaluation by clinical pharmacy.  Patient will then return to clinic in 8 weeks with repeat imaging with PET scan, further evaluation, and continuation of treatment.   Osteopenia: Patient's last bone mineral density was on August 01, 2016 and revealed a T-score of -1.2. Continue monitoring bone mineral density by PCP. Multiple sclerosis: Chronic and.  Continue evaluation and treatment per neurology.  Recent MRI of the brain did not reveal any new lesions. Renal insufficiency: Chronic and unchanged.  Patient's GFR is 32 today.  Kisqali needs to be dose reduced if GFR falls below 30. Anemia: Chronic and unchanged.  Patient's hemoglobin is 8.7..   Diarrhea: Patient does not complain of this today.  Continue Imodium as needed.    Patient expressed understanding and was in agreement with this plan. She also understands that She can call clinic at any time with any questions, concerns, or complaints.   Breast cancer bilateral, multifocal ER/PR pos, Her 2 neg,.   Staging form: Breast, AJCC 7th Edition     Clinical: Stage IIB (T2, N1, M0) - Signed by Johney Maine, MD on  04/22/2015   Jeralyn Ruths, MD   04/15/2023 12:56 PM

## 2023-04-16 LAB — CANCER ANTIGEN 27.29: CA 27.29: 95.5 U/mL — ABNORMAL HIGH (ref 0.0–38.6)

## 2023-04-18 ENCOUNTER — Other Ambulatory Visit: Payer: Self-pay | Admitting: Gastroenterology

## 2023-04-18 ENCOUNTER — Other Ambulatory Visit: Payer: Self-pay | Admitting: Family Medicine

## 2023-04-18 DIAGNOSIS — R809 Proteinuria, unspecified: Secondary | ICD-10-CM

## 2023-04-18 DIAGNOSIS — E1169 Type 2 diabetes mellitus with other specified complication: Secondary | ICD-10-CM

## 2023-04-20 NOTE — Telephone Encounter (Signed)
Requested Prescriptions  Refused Prescriptions Disp Refills   metFORMIN (GLUCOPHAGE) 500 MG tablet [Pharmacy Med Name: METFORMIN HCL 500 MG TABLET] 180 tablet 1    Sig: TAKE 1 TABLET BY MOUTH TWICE A DAY WITH FOOD     Endocrinology:  Diabetes - Biguanides Failed - 04/18/2023 10:03 AM      Failed - Cr in normal range and within 360 days    Creatinine  Date Value Ref Range Status  04/15/2023 1.71 (H) 0.44 - 1.00 mg/dL Final   Creat  Date Value Ref Range Status  10/20/2021 0.85 0.50 - 1.05 mg/dL Final   Creatinine, Urine  Date Value Ref Range Status  10/29/2022 131 20 - 275 mg/dL Final         Failed - eGFR in normal range and within 360 days    GFR, Est African American  Date Value Ref Range Status  10/15/2020 72 > OR = 60 mL/min/1.39m2 Final   GFR, Est Non African American  Date Value Ref Range Status  10/15/2020 62 > OR = 60 mL/min/1.24m2 Final   GFR, Estimated  Date Value Ref Range Status  04/15/2023 32 (L) >60 mL/min Final    Comment:    (NOTE) Calculated using the CKD-EPI Creatinine Equation (2021)    GFR  Date Value Ref Range Status  12/28/2019 62.40 >60.00 mL/min Final   eGFR  Date Value Ref Range Status  10/20/2021 74 > OR = 60 mL/min/1.52m2 Final    Comment:    The eGFR is based on the CKD-EPI 2021 equation. To calculate  the new eGFR from a previous Creatinine or Cystatin C result, go to https://www.kidney.org/professionals/ kdoqi/gfr%5Fcalculator          Failed - B12 Level in normal range and within 720 days    Vitamin B-12  Date Value Ref Range Status  10/20/2021 >2,000 (H) 200 - 1,100 pg/mL Final         Failed - CBC within normal limits and completed in the last 12 months    WBC  Date Value Ref Range Status  04/15/2023 4.3 4.0 - 10.5 K/uL Final   RBC  Date Value Ref Range Status  04/15/2023 2.36 (L) 3.87 - 5.11 MIL/uL Final   Hemoglobin  Date Value Ref Range Status  04/15/2023 8.7 (L) 12.0 - 15.0 g/dL Final  16/08/9603 9.1 (L) 12.0 -  15.0 g/dL Final  54/07/8118 14.7 11.1 - 15.9 g/dL Final   HCT  Date Value Ref Range Status  04/15/2023 25.7 (L) 36.0 - 46.0 % Final   Hematocrit  Date Value Ref Range Status  07/13/2017 46.7 (H) 34.0 - 46.6 % Final   MCHC  Date Value Ref Range Status  04/15/2023 33.9 30.0 - 36.0 g/dL Final   [facility]  Date Value Ref Range Status  04/15/2023 36.9 (H) 26.0 - 34.0 pg Final   MCV  Date Value Ref Range Status  04/15/2023 108.9 (H) 80.0 - 100.0 fL Final  07/13/2017 94 79 - 97 fL Final  03/11/2015 93 80 - 100 fL Final   No results found for: "PLTCOUNTKUC", "LABPLAT", "POCPLA" RDW  Date Value Ref Range Status  04/15/2023 15.4 11.5 - 15.5 % Final  07/13/2017 13.1 12.3 - 15.4 % Final  03/11/2015 13.8 11.5 - 14.5 % Final         Passed - HBA1C is between 0 and 7.9 and within 180 days    HB A1C (BAYER DCA - WAIVED)  Date Value Ref Range Status  07/13/2017 6.5 <  7.0 % Final    Comment:                                          Diabetic Adult            <7.0                                       Healthy Adult        4.3 - 5.7                                                           (DCCT/NGSP) American Diabetes Association's Summary of Glycemic Recommendations for Adults with Diabetes: Hemoglobin A1c <7.0%. More stringent glycemic goals (A1c <6.0%) may further reduce complications at the cost of increased risk of hypoglycemia.    Hgb A1c MFr Bld  Date Value Ref Range Status  10/21/2022 5.8 (H) <5.7 % of total Hgb Final    Comment:    For someone without known diabetes, a hemoglobin  A1c value between 5.7% and 6.4% is consistent with prediabetes and should be confirmed with a  follow-up test. . For someone with known diabetes, a value <7% indicates that their diabetes is well controlled. A1c targets should be individualized based on duration of diabetes, age, comorbid conditions, and other considerations. . This assay result is consistent with an increased risk of  diabetes. . Currently, no consensus exists regarding use of hemoglobin A1c for diagnosis of diabetes for children. Verna Czech - Valid encounter within last 6 months    Recent Outpatient Visits           5 months ago Essential hypertension, benign   [place] [facility] Smitty Cords, [place]   11 months ago Pruritic erythematous rash   [place] [facility] Smitty Cords, DO   11 months ago Type 2 diabetes mellitus with other specified complication, without long-term current use of insulin [facility])   [place] [facility] Althea Charon, Netta Neat, DO   1 year ago Annual physical exam   [place] [facility] Smitty Cords, DO   1 year ago Chest wall pain   [place] [facility] Smitty Cords, DO       Future Appointments             In 1 week Althea Charon, Netta Neat, DO [place] [facility], [place]   In 4 months Deirdre Evener, MD [place] [facility]             lisinopril (ZESTRIL) 5 MG tablet [Pharmacy Med Name: LISINOPRIL 5 MG TABLET] 90 tablet 0    Sig: TAKE 1 TABLET BY MOUTH EVERY DAY IN THE EVENING     Cardiovascular:  ACE Inhibitors Failed - 04/18/2023 10:03 AM      Failed - Cr in normal range and within 180 days    Creatinine  Date Value Ref Range Status  04/15/2023 1.71 (H) 0.44 - 1.00 mg/dL  Final   Creat  Date Value Ref Range Status  10/20/2021 0.85 0.50 - 1.05 mg/dL Final   Creatinine, Urine  Date Value Ref Range Status  10/29/2022 131 20 - 275 mg/dL Final         Passed - K in normal range and within 180 days    Potassium  Date Value Ref Range Status  04/15/2023 3.9 3.5 - 5.1 mmol/L Final  03/11/2015 3.4 (L) mmol/L Final    Comment:    3.5-5.1 NOTE: New Reference Range  01/29/15          Passed - Patient is not pregnant      Passed - Last BP in normal range    BP Readings  from Last 1 Encounters:  04/15/23 131/81         Passed - Valid encounter within last 6 months    Recent Outpatient Visits           5 months ago Essential hypertension, benign   [place] [facility] Smitty Cords, DO   11 months ago Pruritic erythematous rash   [place] [facility] Smitty Cords, DO   11 months ago Type 2 diabetes mellitus with other specified complication, without long-term current use of insulin [facility])   [place] [facility], Netta Neat, DO   1 year ago Annual physical exam   [place] [facility] Smitty Cords, DO   1 year ago Chest wall pain   [place] [facility] Smitty Cords, DO       Future Appointments             In 1 week Althea Charon, Netta Neat, DO  [facility], [place]   In 4 months Deirdre Evener, MD [facility]

## 2023-04-22 ENCOUNTER — Other Ambulatory Visit: Payer: Self-pay | Admitting: Gastroenterology

## 2023-04-26 ENCOUNTER — Other Ambulatory Visit: Payer: Self-pay | Admitting: Gastroenterology

## 2023-04-26 ENCOUNTER — Ambulatory Visit: Payer: Medicare Other

## 2023-04-26 DIAGNOSIS — R809 Proteinuria, unspecified: Secondary | ICD-10-CM | POA: Diagnosis not present

## 2023-04-26 DIAGNOSIS — I10 Essential (primary) hypertension: Secondary | ICD-10-CM | POA: Diagnosis not present

## 2023-04-26 DIAGNOSIS — N1832 Chronic kidney disease, stage 3b: Secondary | ICD-10-CM | POA: Diagnosis not present

## 2023-04-27 ENCOUNTER — Other Ambulatory Visit: Payer: Self-pay | Admitting: Pharmacist

## 2023-04-27 DIAGNOSIS — C50919 Malignant neoplasm of unspecified site of unspecified female breast: Secondary | ICD-10-CM

## 2023-04-27 NOTE — Telephone Encounter (Signed)
After chart review Kisqali refill was sent last month to the assistance program. I was able to have the program pull the rx manually and connected Cassidy Norris with the pharmacy to set-up delivery.

## 2023-04-28 ENCOUNTER — Ambulatory Visit
Admission: RE | Admit: 2023-04-28 | Discharge: 2023-04-28 | Disposition: A | Discharge status: Home / Self Care | Payer: Medicare Other | Source: Ambulatory Visit | Attending: Oncology | Admitting: Oncology

## 2023-04-28 DIAGNOSIS — C50919 Malignant neoplasm of unspecified site of unspecified female breast: Secondary | ICD-10-CM | POA: Diagnosis not present

## 2023-04-28 DIAGNOSIS — I728 Aneurysm of other specified arteries: Secondary | ICD-10-CM | POA: Insufficient documentation

## 2023-04-28 DIAGNOSIS — I700 Atherosclerosis of aorta: Secondary | ICD-10-CM | POA: Diagnosis not present

## 2023-04-28 DIAGNOSIS — I3139 Other pericardial effusion (noninflammatory): Secondary | ICD-10-CM | POA: Diagnosis not present

## 2023-04-28 LAB — GLUCOSE, CAPILLARY: Glucose-Capillary: 95 mg/dL (ref 70–99)

## 2023-04-28 MED ORDER — FLUDEOXYGLUCOSE F - 18 (FDG) INJECTION
9.8000 | Freq: Once | INTRAVENOUS | Status: AC
  Administered 2023-04-28: 10.27 via INTRAVENOUS

## 2023-04-29 ENCOUNTER — Other Ambulatory Visit: Payer: Self-pay | Admitting: Gastroenterology

## 2023-04-30 ENCOUNTER — Encounter: Payer: Self-pay | Admitting: Family Medicine

## 2023-04-30 ENCOUNTER — Ambulatory Visit (INDEPENDENT_AMBULATORY_CARE_PROVIDER_SITE_OTHER): Payer: Medicare Other | Admitting: Family Medicine

## 2023-04-30 ENCOUNTER — Other Ambulatory Visit: Payer: Self-pay | Admitting: Family Medicine

## 2023-04-30 VITALS — BP 138/68 | HR 100 | Temp 98.9°F | Resp 18 | Ht 65.0 in | Wt 191.2 lb

## 2023-04-30 DIAGNOSIS — E785 Hyperlipidemia, unspecified: Secondary | ICD-10-CM

## 2023-04-30 DIAGNOSIS — J432 Centrilobular emphysema: Secondary | ICD-10-CM

## 2023-04-30 DIAGNOSIS — G720 Drug-induced myopathy: Secondary | ICD-10-CM | POA: Diagnosis not present

## 2023-04-30 DIAGNOSIS — G35 Multiple sclerosis: Secondary | ICD-10-CM

## 2023-04-30 DIAGNOSIS — R809 Proteinuria, unspecified: Secondary | ICD-10-CM | POA: Diagnosis not present

## 2023-04-30 DIAGNOSIS — I10 Essential (primary) hypertension: Secondary | ICD-10-CM | POA: Diagnosis not present

## 2023-04-30 DIAGNOSIS — E559 Vitamin D deficiency, unspecified: Secondary | ICD-10-CM

## 2023-04-30 DIAGNOSIS — I700 Atherosclerosis of aorta: Secondary | ICD-10-CM | POA: Diagnosis not present

## 2023-04-30 DIAGNOSIS — E1169 Type 2 diabetes mellitus with other specified complication: Secondary | ICD-10-CM

## 2023-04-30 DIAGNOSIS — F5101 Primary insomnia: Secondary | ICD-10-CM | POA: Diagnosis not present

## 2023-04-30 LAB — POCT GLYCOSYLATED HEMOGLOBIN (HGB A1C): Hemoglobin A1C: 5.8 % — AB (ref 4.0–5.6)

## 2023-04-30 MED ORDER — METFORMIN HCL 500 MG PO TABS: 500.0000 mg | ORAL_TABLET | Freq: Two times a day (BID) | ORAL | 3 refills | Status: DC

## 2023-04-30 MED ORDER — LISINOPRIL 5 MG PO TABS: 5.0000 mg | ORAL_TABLET | Freq: Every day | ORAL | 3 refills | Status: DC

## 2023-04-30 NOTE — Progress Notes (Addendum)
Subjective:    Patient ID: Cassidy Norris, female    DOB: Apr 15, 1951, 72 y.o.   MRN: [id number]  Cassidy Norris is a 72 y.o. female presenting on 04/30/2023 for Prediabetes (6 mth f/u )   HPI     Dysphagia Difficulty swallowing since March 2024, she says feels like stuck in throat. She has stopped Famotidine (smallest pill) thought it was stuck in throat. She ran out of Omeprazole 40mg  BID. She had some occasional regurgitation. She had a pain in back of throat and felt radiation pain into R ear. Some associated nausea - She tried Zyrtec around that time but it did not seem to be interfering - Usually when taking pills very early AM only - Has had EGD 11/2021, with gastric - She is off Famotidine since not taking Meloxicam regularly - Not drinking enough water  Recurrent, metastatic ER/PR, HER2 negative Breast Cancer Followed by Dr Orlie Dakin, [facility] Oncology Updated    Anemia macrocytic With some reduced Hgb to 8.7 on last labs   Creatinine CKD-IIIb Followed by Nephro Last lab Cr 1.71 prior 1.9 to 2.0, showed some improvement. Followed by Oncology On chemotherapy Potassium has stabilized and Mag has normalized.   Right Knee Right knee incision with some issues with healing now improved, had small cystlike swelling   CHRONIC DM, Type 2: A1c down to 5.8, previously >6-7 range. CBG Meds: Metformin 500 BID Reports good compliance. Tolerating well w/o side-effects Currently on ACEi Lifestyle: - Diet (admits starches were increasing her sugars now improving)  Denies hypoglycemia, polyuria, visual changes, numbness or tingling.   Cataracts Followed by Eye Doctor   HYPERLIPIDEMIA: - Reports concerns with slight elevated TG but improved LDL. Last lipid panel 09/2022, improved LDL 118 from 124 On Krill Oil, not on statin due to myopathy    Chemotherapy Induced Neuropathy Chronic problem with nerve pain Previous treatment not effective   Insomnia Controlled on  Trazodone 50mg  nightly, refills due today       04/30/2023    8:22 AM 03/19/2023   11:47 AM 10/29/2022   10:12 AM  Depression screen PHQ 2/9  Decreased Interest 1 0 0  Down, Depressed, Hopeless 0 0 0  PHQ - 2 Score 1 0 0  Altered sleeping 2 2 3   Tired, decreased energy 2 2 0  Change in appetite 1 2 0  Feeling bad or failure about yourself  0 0 0  Trouble concentrating 0 0 0  Moving slowly or fidgety/restless 0 2 0  Suicidal thoughts 0 0 0  PHQ-9 Score 6 8 3   Difficult doing work/chores Not difficult at all Not difficult at all Not difficult at all      04/30/2023    8:22 AM 03/19/2023   11:47 AM 10/29/2022   10:12 AM 05/21/2022   11:27 AM  GAD 7 : Generalized Anxiety Score  Nervous, Anxious, on Edge 0 0 0 0  Control/stop worrying 0 0 0 0  Worry too much - different things 0 0 0 0  Trouble relaxing 2 0 0 0  Restless 0 0 0 0  Easily annoyed or irritable 1 1 1 1   Afraid - awful might happen 0 0 0 0  Total GAD 7 Score 3 1 1 1   Anxiety Difficulty Not difficult at all Not difficult at all Not difficult at all Not difficult at all      Social History   Tobacco Use   Smoking status: Former    Packs/day: 1.00  Years: 30.00    Additional pack years: 0.00    Total pack years: 30.00    Types: Cigarettes    Quit date: 12/11/2013    Years since quitting: 9.3   Smokeless tobacco: Former  Building services engineer Use: Never used  Substance Use Topics   Alcohol use: Not Currently   Drug use: No    Review of Systems Per HPI unless specifically indicated above     Objective:    BP 138/68 (BP Location: Right Arm, Patient Position: Sitting, Cuff Size: Large)   Pulse 100   Temp 98.9 F (37.2 C) (Oral)   Resp 18   Ht 5\' 5"  (1.651 m)   Wt 191 lb 3.2 oz (86.7 kg)   LMP 12/22/1999 (Approximate)   BMI 31.82 kg/m   Wt Readings from Last 3 Encounters:  04/30/23 191 lb 3.2 oz (86.7 kg)  04/15/23 190 lb (86.2 kg)  03/19/23 191 lb (86.6 kg)    Physical Exam Vitals and nursing  note reviewed.  Constitutional:      General: She is not in acute distress.    Appearance: She is well-developed. She is not diaphoretic.     Comments: Well-appearing, comfortable, cooperative  HENT:     Head: Normocephalic and atraumatic.  Eyes:     General:        Right eye: No discharge.        Left eye: No discharge.     Conjunctiva/sclera: Conjunctivae normal.  Neck:     Thyroid: No thyromegaly.     Vascular: Carotid bruit (bilateral stable) present.  Cardiovascular:     Rate and Rhythm: Normal rate and regular rhythm.     Heart sounds: Normal heart sounds. No murmur heard. Pulmonary:     Effort: Pulmonary effort is normal. No respiratory distress.     Breath sounds: Normal breath sounds. No wheezing or rales.  Musculoskeletal:        General: Normal range of motion.     Cervical back: Normal range of motion and neck supple.  Lymphadenopathy:     Cervical: No cervical adenopathy.  Skin:    General: Skin is warm and dry.     Findings: No erythema or rash.  Neurological:     Mental Status: She is alert and oriented to person, place, and time.  Psychiatric:        Behavior: Behavior normal.     Comments: Well groomed, good eye contact, normal speech and thoughts      Results for orders placed or performed in visit on 04/30/23  POCT glycosylated hemoglobin (Hb A1C)  Result Value Ref Range   Hemoglobin A1C 5.8 (A) 4.0 - 5.6 %   HbA1c POC (<> result, manual entry)     HbA1c, POC (prediabetic range)     HbA1c, POC (controlled diabetic range)        Assessment & Plan:   Problem List Items Addressed This Visit     Aortic atherosclerosis (HCC)    Known vascular disease, on imaging No longer on statin therapy      Relevant Medications   lisinopril (ZESTRIL) 5 MG tablet   Centrilobular emphysema (HCC)    On imaging Asymptomatic not on maintenance      Drug-induced myopathy    Secondary to statin with myopathy Remains off therapy      Essential  hypertension, benign - Primary    Well-controlled HYPERTENSION, repeat normal - Home BP readings normal  Improved Creatinine  NSAID  meloxicam OFF Carvedilol from prior visit 03/2018, not indicated if normal HR and no cardiac etiology.    Plan:  1. Continue current BP regimen - Lisinopril 5mg  daily - refill 2. Encourage improved lifestyle - low sodium diet, regular exercise 3. Continue monitor BP outside office, bring readings to next visit, if persistently >140/90 or new symptoms notify office sooner      Relevant Medications   lisinopril (ZESTRIL) 5 MG tablet   Hyperlipidemia associated with type 2 diabetes mellitus (HCC)   Relevant Medications   metFORMIN (GLUCOPHAGE) 500 MG tablet   lisinopril (ZESTRIL) 5 MG tablet   Insomnia    Chronic insomnia Primary Controlled Re order Trazodone      Microalbuminuria   Relevant Medications   lisinopril (ZESTRIL) 5 MG tablet   Multiple sclerosis (HCC)   Type 2 diabetes mellitus with other specified complication (HCC)    A1c 5.8 stable to improved, well controlled No hypoglycemia. Complications - peripheral neuropathy mostly due to chemotherapy toxicity, other including hyperlipidemia, GERD, , obesity, hypothyroidism, OSA - increases risk of future cardiovascular complications  OFF Metformin not needed Did not take Rybelsus  Plan:  1. Continue Metformin 500mg  TWICE A DAY but we discussed goal to taper down to 1 x daily in future or adjust accordingly 2. Encourage improved lifestyle - low carb, low sugar diet, reduce portion size, continue improving regular exercise 3. Check CBG if needed 4. Continue ACEi, - refused restart Statin - due to myopathy      Relevant Medications   metFORMIN (GLUCOPHAGE) 500 MG tablet   lisinopril (ZESTRIL) 5 MG tablet   Other Relevant Orders   POCT glycosylated hemoglobin (Hb A1C) (Completed)    Orders Placed This Encounter  Procedures   POCT glycosylated hemoglobin (Hb A1C)     Meds ordered  this encounter  Medications   metFORMIN (GLUCOPHAGE) 500 MG tablet    Sig: Take 1 tablet (500 mg total) by mouth 2 (two) times daily with a meal.    Dispense:  180 tablet    Refill:  3   lisinopril (ZESTRIL) 5 MG tablet    Sig: Take 1 tablet (5 mg total) by mouth daily.    Dispense:  90 tablet    Refill:  3     Follow up plan: Return in about 6 months (around 10/30/2023) for 6 month fasting lab only then 1 week later Annual Physical.  Future labs ordered for 10/25/23 Vit D  Lipid A1c, TSH   Saralyn Pilar, DO [facility] 04/30/2023, 8:26 AM

## 2023-04-30 NOTE — Assessment & Plan Note (Addendum)
Secondary to statin with myopathy Remains off therapy

## 2023-04-30 NOTE — Assessment & Plan Note (Signed)
Known vascular disease, on imaging No longer on statin therapy 

## 2023-04-30 NOTE — Assessment & Plan Note (Signed)
Well-controlled HYPERTENSION, repeat normal - Home BP readings normal  Improved Creatinine  NSAID meloxicam OFF Carvedilol from prior visit 03/2018, not indicated if normal HR and no cardiac etiology.    Plan:  1. Continue current BP regimen - Lisinopril 5mg daily - refill 2. Encourage improved lifestyle - low sodium diet, regular exercise 3. Continue monitor BP outside office, bring readings to next visit, if persistently >140/90 or new symptoms notify office sooner 

## 2023-04-30 NOTE — Assessment & Plan Note (Signed)
Chronic insomnia Primary Controlled Re order Trazodone 

## 2023-04-30 NOTE — Patient Instructions (Addendum)
Thank you for coming to the office today.    Please schedule a Follow-up Appointment to: Return in about 6 months (around 10/30/2023) for 6 month fasting lab only then 1 week later Annual Physical.  If you have any other questions or concerns, please feel free to call the office or send a message through MyChart. You may also schedule an earlier appointment if necessary.  Additionally, you may be receiving a survey about your experience at our office within a few days to 1 week by e-mail or mail. We value your feedback.  Saralyn Pilar, DO [facility], [place]

## 2023-04-30 NOTE — Assessment & Plan Note (Signed)
On imaging Asymptomatic not on maintenance 

## 2023-04-30 NOTE — Assessment & Plan Note (Signed)
A1c 5.8 stable to improved, well controlled No hypoglycemia. Complications - peripheral neuropathy mostly due to chemotherapy toxicity, other including hyperlipidemia, GERD, , obesity, hypothyroidism, OSA - increases risk of future cardiovascular complications  OFF Metformin not needed Did not take Rybelsus  Plan:  1. Continue Metformin 500mg  TWICE A DAY but we discussed goal to taper down to 1 x daily in future or adjust accordingly 2. Encourage improved lifestyle - low carb, low sugar diet, reduce portion size, continue improving regular exercise 3. Check CBG if needed 4. Continue ACEi, - refused restart Statin - due to myopathy

## 2023-05-13 ENCOUNTER — Inpatient Hospital Stay: Payer: Medicare Other | Admitting: Pharmacist

## 2023-05-13 ENCOUNTER — Inpatient Hospital Stay: Payer: Medicare Other

## 2023-05-13 ENCOUNTER — Inpatient Hospital Stay: Payer: Medicare Other | Attending: Oncology

## 2023-05-13 VITALS — BP 131/69 | HR 72 | Temp 97.5°F | Resp 16 | Ht 65.0 in | Wt 190.0 lb

## 2023-05-13 DIAGNOSIS — Z79810 Long term (current) use of selective estrogen receptor modulators (SERMs): Secondary | ICD-10-CM | POA: Diagnosis not present

## 2023-05-13 DIAGNOSIS — Z170 Estrogen receptor positive status [ER+]: Secondary | ICD-10-CM | POA: Diagnosis not present

## 2023-05-13 DIAGNOSIS — C50912 Malignant neoplasm of unspecified site of left female breast: Secondary | ICD-10-CM | POA: Diagnosis not present

## 2023-05-13 DIAGNOSIS — N840 Polyp of corpus uteri: Secondary | ICD-10-CM

## 2023-05-13 DIAGNOSIS — C50919 Malignant neoplasm of unspecified site of unspecified female breast: Secondary | ICD-10-CM

## 2023-05-13 DIAGNOSIS — C50911 Malignant neoplasm of unspecified site of right female breast: Secondary | ICD-10-CM | POA: Diagnosis not present

## 2023-05-13 DIAGNOSIS — Z5111 Encounter for antineoplastic chemotherapy: Secondary | ICD-10-CM | POA: Diagnosis not present

## 2023-05-13 DIAGNOSIS — N838 Other noninflammatory disorders of ovary, fallopian tube and broad ligament: Secondary | ICD-10-CM

## 2023-05-13 DIAGNOSIS — D3912 Neoplasm of uncertain behavior of left ovary: Secondary | ICD-10-CM

## 2023-05-13 LAB — CBC WITH DIFFERENTIAL/PLATELET
Abs Immature Granulocytes: 0.02 10*3/uL (ref 0.00–0.07)
Basophils Absolute: 0.1 10*3/uL (ref 0.0–0.1)
Basophils Relative: 2 %
Eosinophils Absolute: 0.1 10*3/uL (ref 0.0–0.5)
Eosinophils Relative: 2 %
HCT: 26.6 % — ABNORMAL LOW (ref 36.0–46.0)
Hemoglobin: 9 g/dL — ABNORMAL LOW (ref 12.0–15.0)
Immature Granulocytes: 1 %
Lymphocytes Relative: 33 %
Lymphs Abs: 1.3 10*3/uL (ref 0.7–4.0)
MCH: 37 pg — ABNORMAL HIGH (ref 26.0–34.0)
MCHC: 33.8 g/dL (ref 30.0–36.0)
MCV: 109.5 fL — ABNORMAL HIGH (ref 80.0–100.0)
Monocytes Absolute: 0.7 10*3/uL (ref 0.1–1.0)
Monocytes Relative: 18 %
Neutro Abs: 1.7 10*3/uL (ref 1.7–7.7)
Neutrophils Relative %: 44 %
Platelets: 199 10*3/uL (ref 150–400)
RBC: 2.43 MIL/uL — ABNORMAL LOW (ref 3.87–5.11)
RDW: 14.9 % (ref 11.5–15.5)
WBC: 3.8 10*3/uL — ABNORMAL LOW (ref 4.0–10.5)
nRBC: 0 % (ref 0.0–0.2)

## 2023-05-13 LAB — CMP (CANCER CENTER ONLY)
ALT: 14 U/L (ref 0–44)
AST: 21 U/L (ref 15–41)
Albumin: 4.2 g/dL (ref 3.5–5.0)
Alkaline Phosphatase: 60 U/L (ref 38–126)
Anion gap: 12 (ref 5–15)
BUN: 25 mg/dL — ABNORMAL HIGH (ref 8–23)
CO2: 25 mmol/L (ref 22–32)
Calcium: 9.7 mg/dL (ref 8.9–10.3)
Chloride: 102 mmol/L (ref 98–111)
Creatinine: 1.84 mg/dL — ABNORMAL HIGH (ref 0.44–1.00)
GFR, Estimated: 29 mL/min — ABNORMAL LOW (ref >60)
Glucose, Bld: 119 mg/dL — ABNORMAL HIGH (ref 70–99)
Potassium: 3.8 mmol/L (ref 3.5–5.1)
Sodium: 139 mmol/L (ref 135–145)
Total Bilirubin: 0.5 mg/dL (ref 0.3–1.2)
Total Protein: 7.7 g/dL (ref 6.5–8.1)

## 2023-05-13 LAB — MAGNESIUM: Magnesium: 1.8 mg/dL (ref 1.7–2.4)

## 2023-05-13 MED ORDER — FULVESTRANT 250 MG/5ML IM SOSY
500.0000 mg | PREFILLED_SYRINGE | Freq: Once | INTRAMUSCULAR | Status: AC
  Administered 2023-05-13: 500 mg via INTRAMUSCULAR
  Filled 2023-05-13: qty 10

## 2023-05-13 NOTE — Progress Notes (Signed)
Oral Chemotherapy Clinic [facility]  [phone] [phone]  Patient Care Team: Smitty Cords, DO as PCP - General (Family Medicine) Drema Dallas, DO as Consulting Physician (Neurology) Jeralyn Ruths, MD as Consulting Physician (Oncology)   Name of the patient: Cassidy Norris  [id number]  02-Sep-1951   Date of visit: 05/13/23  HPI: Patient is a 72 y.o. female with recurrent breast cancer, ER/PR positive/HER2 negative. Currently treating with Kisqali (ribociclib) and fulvestrant. She started ribociclib on 01/08/22.   Reason for Consult: Oral chemotherapy follow-up for ribociclib therapy.   PAST MEDICAL HISTORY: Past Medical History:  Diagnosis Date   Anemia    Arthritis not really diagnosis but have pain   B12 deficiency 07/23/2016   Will check levels to determine if injections are needed again. Await results. Recent CBC at Onc WNL   Blood transfusion without reported diagnosis 1983 had a miscarriage/dnc   Cancer (HCC) 12/30/2013   Multifocal disease: pT1c,m, N0(isolated tumor cells) Er/ PR positive, Her 2 negative.   Cancer (HCC) 01/09/2014   Invasive lobular carcinoma, 2.2 cm, T2,N1 ER/PR positive, HER-2/neu negative   Cataract 2014?   Centrilobular emphysema (HCC) 10/27/2021   Complication of anesthesia 03/06/2022   Prolonged metabolism of Rocuronium during case.   Diabetes mellitus without complication (HCC)    Diffuse cystic mastopathy    Erosive esophagitis    Essential hypertension, benign 09/24/2017   Family history of malignant neoplasm of gastrointestinal tract 2012   Fractured elbow 08/28/2014   Gastroesophageal reflux disease    Hx of metabolic acidosis with increased anion gap    Increased heart rate    Multiple sclerosis (HCC)    Neuromuscular disorder (HCC)    neuropathy in bil feet - left is worse.   Obesity, unspecified    Peripheral neuropathy    Personal history of tobacco use, presenting hazards  to health    Restless leg syndrome    Sleep apnea    uses CPAP   Special screening for malignant neoplasms, colon    Squamous cell carcinoma of skin 08/24/2022   SCCIS - scalp, ED&C    HEMATOLOGY/ONCOLOGY HISTORY:  Oncology History Overview Note  1. Bilateral carcinoma of breast, February, 2015. Right breast status post radical mastectomy. T1 C. N0M0 (isolated tumor cells in one lymph node) multifocal invasive cancer. Stage IC, Left breast, Status post radical mastectomy, T2, N1, M0 tumor. Stage II, RIght breast. Both tumors are estrogen receptor positive.  Progesterone receptor positive.  HER-2 receptor negative by FISH. Negative for BRCA mutation.  2. Started on Adriamycin and Cytoxan on March 01, 2014. Last cycle of Cytoxan and Adriamycin on May 01, 2014. Patient has finished last chemotherapy on September 9. (12 th dose was omitted because of neuropathy) 3.  Taking tamoxifen.  Patient had a poor tolerance to letrozole.(October, 2015) 4.  Patient is taking letrozole since November of 2015 5.  May, 2016 Letrozole has been put on hold because of bony pains 6.  Started on Aromasin from July of 2016    History of bilateral breast cancer (Resolved)  11/29/2013 Initial Diagnosis   Breast cancer bilateral, multifocal ER/PR pos, Her 2 neg,.     ALLERGIES:  has No Known Allergies.  MEDICATIONS:  Current Outpatient Medications  Medication Sig Dispense Refill   acetaminophen (TYLENOL) 500 MG tablet Take 1,000 mg by mouth 2 (two) times daily as needed for moderate pain or headache.     baclofen (LIORESAL) 10 MG tablet Take 1 tablet (  10 mg total) by mouth 3 (three) times daily as needed for muscle spasms. 1080 tablet 0   calcium carbonate (OSCAL) 1500 (600 Ca) MG TABS tablet Take by mouth 2 (two) times daily with a meal.     diclofenac Sodium (VOLTAREN) 1 % GEL Apply 2 g topically 3 (three) times daily as needed. 100 g 2   fluticasone (FLONASE) 50 MCG/ACT nasal spray PLACE 2 SPRAYS INTO  BOTH NOSTRILS AT BEDTIME. 48 g 1   fulvestrant (FASLODEX) 250 MG/5ML injection Inject 500 mg into the muscle every 30 (thirty) days. One injection each buttock over 1-2 minutes. Warm prior to use.     Krill Oil 1000 MG CAPS Take 1,000 mg by mouth daily.     lisinopril (ZESTRIL) 5 MG tablet Take 1 tablet (5 mg total) by mouth daily. 90 tablet 3   loperamide (IMODIUM A-D) 2 MG tablet Take 2 tablets (4 mg total) by mouth 4 (four) times daily as needed for diarrhea or loose stools. 30 tablet 0   metFORMIN (GLUCOPHAGE) 500 MG tablet Take 1 tablet (500 mg total) by mouth 2 (two) times daily with a meal. 180 tablet 3   mometasone (ELOCON) 0.1 % cream Apply to affected skin qd-bid prn flares 45 g 2   ondansetron (ZOFRAN) 8 MG tablet Take 1 tablet (8 mg total) by mouth every 8 (eight) hours as needed for nausea or vomiting. 20 tablet 0   prochlorperazine (COMPAZINE) 10 MG tablet Take 1 tablet (10 mg total) by mouth every 6 (six) hours as needed for nausea or vomiting. 20 tablet 1   ribociclib succ (KISQALI, 600 MG DOSE,) 200 MG Therapy Pack Take 3 tablets (600 mg total) by mouth daily. Take for 21 days on, 7 days off, repeat every 28 days. 63 tablet 5   sodium chloride (OCEAN) 0.65 % SOLN nasal spray Place 1 spray into both nostrils as needed for congestion.     traZODone (DESYREL) 50 MG tablet Take 1 tablet (50 mg total) by mouth at bedtime as needed. for sleep 90 tablet 3   venlafaxine XR (EFFEXOR-XR) 75 MG 24 hr capsule Take 1 capsule (75 mg total) by mouth daily with breakfast. 360 capsule 0   vitamin B-12 (CYANOCOBALAMIN) 1000 MCG tablet Take 2,500 mcg by mouth every Monday, Wednesday, and Friday.     Vitamin D, Cholecalciferol, 50 MCG (2000 UT) CAPS Take 2,000 Units by mouth every Monday, Wednesday, and Friday.     No current facility-administered medications for this visit.    VITAL SIGNS: BP 131/69   Pulse 72   Temp (!) 97.5 F (36.4 C)   Resp 16   Ht 5\' 5"  (1.651 m)   Wt 86.2 kg (190 lb)    LMP 12/22/1999 (Approximate)   SpO2 100%   BMI 31.62 kg/m  Filed Weights   05/13/23 1025  Weight: 86.2 kg (190 lb)     Estimated body mass index is 31.62 kg/m as calculated from the following:   Height as of this encounter: 5\' 5"  (1.651 m).   Weight as of this encounter: 86.2 kg (190 lb).  LABS: CBC:    Component Value Date/Time   WBC 3.8 (L) 05/13/2023 1008   HGB 9.0 (L) 05/13/2023 1008   HGB 9.1 (L) 02/18/2023 0944   HGB 15.5 07/13/2017 0937   HCT 26.6 (L) 05/13/2023 1008   HCT 46.7 (H) 07/13/2017 0937   PLT 199 05/13/2023 1008   PLT 211 02/18/2023 0944   PLT 211 07/13/2017  1610   MCV 109.5 (H) 05/13/2023 1008   MCV 94 07/13/2017 0937   MCV 93 03/11/2015 1356   NEUTROABS 1.7 05/13/2023 1008   NEUTROABS 5.4 07/13/2017 0937   NEUTROABS 5.6 03/11/2015 1356   LYMPHSABS 1.3 05/13/2023 1008   LYMPHSABS 2.1 07/13/2017 0937   LYMPHSABS 2.3 03/11/2015 1356   MONOABS 0.7 05/13/2023 1008   MONOABS 1.0 (H) 03/11/2015 1356   EOSABS 0.1 05/13/2023 1008   EOSABS 0.2 07/13/2017 0937   EOSABS 0.3 03/11/2015 1356   BASOSABS 0.1 05/13/2023 1008   BASOSABS 0.0 07/13/2017 0937   BASOSABS 0.1 03/11/2015 1356   Comprehensive Metabolic Panel:    Component Value Date/Time   NA 139 05/13/2023 1009   NA 142 07/14/2019 1516   NA 139 03/11/2015 1356   K 3.8 05/13/2023 1009   K 3.4 (L) 03/11/2015 1356   CL 102 05/13/2023 1009   CL 103 03/11/2015 1356   CO2 25 05/13/2023 1009   CO2 28 03/11/2015 1356   BUN 25 (H) 05/13/2023 1009   BUN 12 07/14/2019 1516   BUN 13 03/11/2015 1356   CREATININE 1.84 (H) 05/13/2023 1009   CREATININE 0.85 10/20/2021 0838   GLUCOSE 119 (H) 05/13/2023 1009   GLUCOSE 118 (H) 03/11/2015 1356   CALCIUM 9.7 05/13/2023 1009   CALCIUM 9.2 03/11/2015 1356   AST 21 05/13/2023 1009   ALT 14 05/13/2023 1009   ALT 32 03/11/2015 1356   ALKPHOS 60 05/13/2023 1009   ALKPHOS 68 03/11/2015 1356   BILITOT 0.5 05/13/2023 1009   PROT 7.7 05/13/2023 1009   PROT 6.9  07/14/2019 1516   PROT 6.9 03/11/2015 1356   ALBUMIN 4.2 05/13/2023 1009   ALBUMIN 4.5 07/14/2019 1516   ALBUMIN 4.2 03/11/2015 1356     Present during today's visit: patient only  Assessment and Plan: CBC and CMP assesed with pateint, continue ribociclib 600mg  21 days on/ 7 days off.  Patient will receive fulvestrant today.  Continue to monitor renal function, if CrCl <30, might need to dose decrease ribociclib    Oral Chemotherapy Side Effect/Intolerance:  Patient tolerating treatment well, nausea has recently improved with stopping some of her OTC medications  Oral Chemotherapy Adherence: No missed doses reported There are no patient barriers to medication adherence identified.   New medications: None reported  Medication Access Issues: No issues, She has been approved for 2024 [employer] patient assistance.   Patient expressed understanding and was in agreement with this plan. She also understands that She can call clinic at any time with any questions, concerns, or complaints.   Follow-up plan: RTC in 4 weeks.   Thank you for allowing me to participate in the care of this very pleasant patient.   Time Total: 20 minutes  Visit consisted of counseling and education on dealing with issues of symptom management in the setting of serious and potentially life-threatening illness.Greater than 50%  of this time was spent counseling and coordinating care related to the above assessment and plan.  Signed by: Remi Haggard, PharmD, BCPS, Nolon Bussing, CPP Hematology/Oncology Clinical Pharmacist Practitioner [place]/DB/AP Oral Chemotherapy Navigation Clinic [phone]  05/13/2023 1:46 PM

## 2023-05-14 LAB — CANCER ANTIGEN 27.29: CA 27.29: 90.4 U/mL — ABNORMAL HIGH (ref 0.0–38.6)

## 2023-05-18 DIAGNOSIS — L723 Sebaceous cyst: Secondary | ICD-10-CM | POA: Diagnosis not present

## 2023-05-18 DIAGNOSIS — D485 Neoplasm of uncertain behavior of skin: Secondary | ICD-10-CM | POA: Diagnosis not present

## 2023-05-26 ENCOUNTER — Ambulatory Visit
Admission: RE | Admit: 2023-05-26 | Discharge: 2023-05-26 | Disposition: A | Discharge status: Home / Self Care | Payer: Medicare Other | Source: Ambulatory Visit | Attending: Oncology | Admitting: Oncology

## 2023-05-26 DIAGNOSIS — N838 Other noninflammatory disorders of ovary, fallopian tube and broad ligament: Secondary | ICD-10-CM | POA: Diagnosis not present

## 2023-05-26 DIAGNOSIS — N888 Other specified noninflammatory disorders of cervix uteri: Secondary | ICD-10-CM | POA: Diagnosis not present

## 2023-05-26 DIAGNOSIS — N840 Polyp of corpus uteri: Secondary | ICD-10-CM | POA: Diagnosis not present

## 2023-05-26 DIAGNOSIS — D259 Leiomyoma of uterus, unspecified: Secondary | ICD-10-CM | POA: Diagnosis not present

## 2023-05-26 DIAGNOSIS — C50919 Malignant neoplasm of unspecified site of unspecified female breast: Secondary | ICD-10-CM | POA: Insufficient documentation

## 2023-06-02 ENCOUNTER — Telehealth: Payer: Self-pay | Admitting: *Deleted

## 2023-06-02 NOTE — Telephone Encounter (Signed)
Called report  IMPRESSION: 1. Solid heterogeneous mass within the LEFT ovary measures 2.7 centimeters and correlates well with recent PET-CT. Recommend further evaluation with pelvic MRI with intravenous contrast. 2. Posterior uterine fibroid is 1.7 centimeters. 3. RIGHT ovary is normal in appearance. 4. No ascites.   These results will be called to the ordering clinician or representative by the Radiologist Assistant, and communication documented in the PACS or [employer].     Electronically Signed   By: Norva Pavlov M.D.   On: 06/02/2023 13:31

## 2023-06-03 ENCOUNTER — Encounter: Payer: Self-pay | Admitting: Oncology

## 2023-06-10 ENCOUNTER — Inpatient Hospital Stay (HOSPITAL_BASED_OUTPATIENT_CLINIC_OR_DEPARTMENT_OTHER): Payer: Medicare Other | Admitting: Oncology

## 2023-06-10 ENCOUNTER — Encounter: Payer: Self-pay | Admitting: Family Medicine

## 2023-06-10 ENCOUNTER — Inpatient Hospital Stay: Payer: Medicare Other

## 2023-06-10 ENCOUNTER — Encounter: Payer: Self-pay | Admitting: Oncology

## 2023-06-10 ENCOUNTER — Inpatient Hospital Stay: Payer: Medicare Other | Attending: Oncology

## 2023-06-10 VITALS — BP 162/69 | HR 88 | Temp 97.1°F | Resp 16 | Ht 65.0 in | Wt 192.0 lb

## 2023-06-10 DIAGNOSIS — N189 Chronic kidney disease, unspecified: Secondary | ICD-10-CM | POA: Diagnosis not present

## 2023-06-10 DIAGNOSIS — R630 Anorexia: Secondary | ICD-10-CM | POA: Diagnosis not present

## 2023-06-10 DIAGNOSIS — D3912 Neoplasm of uncertain behavior of left ovary: Secondary | ICD-10-CM | POA: Insufficient documentation

## 2023-06-10 DIAGNOSIS — Z79899 Other long term (current) drug therapy: Secondary | ICD-10-CM | POA: Diagnosis not present

## 2023-06-10 DIAGNOSIS — Z853 Personal history of malignant neoplasm of breast: Secondary | ICD-10-CM | POA: Diagnosis not present

## 2023-06-10 DIAGNOSIS — D649 Anemia, unspecified: Secondary | ICD-10-CM | POA: Diagnosis not present

## 2023-06-10 DIAGNOSIS — Z801 Family history of malignant neoplasm of trachea, bronchus and lung: Secondary | ICD-10-CM | POA: Insufficient documentation

## 2023-06-10 DIAGNOSIS — Z170 Estrogen receptor positive status [ER+]: Secondary | ICD-10-CM | POA: Diagnosis not present

## 2023-06-10 DIAGNOSIS — C50919 Malignant neoplasm of unspecified site of unspecified female breast: Secondary | ICD-10-CM

## 2023-06-10 DIAGNOSIS — Z96652 Presence of left artificial knee joint: Secondary | ICD-10-CM | POA: Insufficient documentation

## 2023-06-10 DIAGNOSIS — E785 Hyperlipidemia, unspecified: Secondary | ICD-10-CM | POA: Diagnosis not present

## 2023-06-10 DIAGNOSIS — Z7984 Long term (current) use of oral hypoglycemic drugs: Secondary | ICD-10-CM | POA: Insufficient documentation

## 2023-06-10 DIAGNOSIS — C778 Secondary and unspecified malignant neoplasm of lymph nodes of multiple regions: Secondary | ICD-10-CM | POA: Diagnosis not present

## 2023-06-10 DIAGNOSIS — Z87891 Personal history of nicotine dependence: Secondary | ICD-10-CM | POA: Insufficient documentation

## 2023-06-10 DIAGNOSIS — Z85828 Personal history of other malignant neoplasm of skin: Secondary | ICD-10-CM | POA: Insufficient documentation

## 2023-06-10 DIAGNOSIS — E1142 Type 2 diabetes mellitus with diabetic polyneuropathy: Secondary | ICD-10-CM | POA: Insufficient documentation

## 2023-06-10 DIAGNOSIS — I129 Hypertensive chronic kidney disease with stage 1 through stage 4 chronic kidney disease, or unspecified chronic kidney disease: Secondary | ICD-10-CM | POA: Diagnosis not present

## 2023-06-10 DIAGNOSIS — I700 Atherosclerosis of aorta: Secondary | ICD-10-CM | POA: Diagnosis not present

## 2023-06-10 DIAGNOSIS — L723 Sebaceous cyst: Secondary | ICD-10-CM | POA: Diagnosis not present

## 2023-06-10 DIAGNOSIS — M8580 Other specified disorders of bone density and structure, unspecified site: Secondary | ICD-10-CM | POA: Diagnosis not present

## 2023-06-10 DIAGNOSIS — Z800 Family history of malignant neoplasm of digestive organs: Secondary | ICD-10-CM | POA: Insufficient documentation

## 2023-06-10 DIAGNOSIS — E1136 Type 2 diabetes mellitus with diabetic cataract: Secondary | ICD-10-CM | POA: Diagnosis not present

## 2023-06-10 DIAGNOSIS — E1122 Type 2 diabetes mellitus with diabetic chronic kidney disease: Secondary | ICD-10-CM | POA: Insufficient documentation

## 2023-06-10 DIAGNOSIS — N838 Other noninflammatory disorders of ovary, fallopian tube and broad ligament: Secondary | ICD-10-CM

## 2023-06-10 DIAGNOSIS — Z9013 Acquired absence of bilateral breasts and nipples: Secondary | ICD-10-CM | POA: Insufficient documentation

## 2023-06-10 DIAGNOSIS — R971 Elevated cancer antigen 125 [CA 125]: Secondary | ICD-10-CM | POA: Insufficient documentation

## 2023-06-10 DIAGNOSIS — J432 Centrilobular emphysema: Secondary | ICD-10-CM | POA: Diagnosis not present

## 2023-06-10 DIAGNOSIS — K219 Gastro-esophageal reflux disease without esophagitis: Secondary | ICD-10-CM | POA: Diagnosis not present

## 2023-06-10 DIAGNOSIS — E860 Dehydration: Secondary | ICD-10-CM | POA: Diagnosis not present

## 2023-06-10 DIAGNOSIS — L680 Hirsutism: Secondary | ICD-10-CM | POA: Diagnosis not present

## 2023-06-10 DIAGNOSIS — G35 Multiple sclerosis: Secondary | ICD-10-CM | POA: Diagnosis not present

## 2023-06-10 DIAGNOSIS — Z79818 Long term (current) use of other agents affecting estrogen receptors and estrogen levels: Secondary | ICD-10-CM | POA: Insufficient documentation

## 2023-06-10 LAB — CBC WITH DIFFERENTIAL/PLATELET
Abs Immature Granulocytes: 0.01 10*3/uL (ref 0.00–0.07)
Basophils Absolute: 0.1 10*3/uL (ref 0.0–0.1)
Basophils Relative: 3 %
Eosinophils Absolute: 0.1 10*3/uL (ref 0.0–0.5)
Eosinophils Relative: 2 %
HCT: 28.4 % — ABNORMAL LOW (ref 36.0–46.0)
Hemoglobin: 9.9 g/dL — ABNORMAL LOW (ref 12.0–15.0)
Immature Granulocytes: 0 %
Lymphocytes Relative: 32 %
Lymphs Abs: 1.3 10*3/uL (ref 0.7–4.0)
MCH: 37.2 pg — ABNORMAL HIGH (ref 26.0–34.0)
MCHC: 34.9 g/dL (ref 30.0–36.0)
MCV: 106.8 fL — ABNORMAL HIGH (ref 80.0–100.0)
Monocytes Absolute: 0.7 10*3/uL (ref 0.1–1.0)
Monocytes Relative: 18 %
Neutro Abs: 1.9 10*3/uL (ref 1.7–7.7)
Neutrophils Relative %: 45 %
Platelets: 206 10*3/uL (ref 150–400)
RBC: 2.66 MIL/uL — ABNORMAL LOW (ref 3.87–5.11)
RDW: 14.7 % (ref 11.5–15.5)
WBC: 4.1 10*3/uL (ref 4.0–10.5)
nRBC: 0 % (ref 0.0–0.2)

## 2023-06-10 LAB — CMP (CANCER CENTER ONLY)
ALT: 19 U/L (ref 0–44)
AST: 32 U/L (ref 15–41)
Albumin: 4.3 g/dL (ref 3.5–5.0)
Alkaline Phosphatase: 68 U/L (ref 38–126)
Anion gap: 13 (ref 5–15)
BUN: 28 mg/dL — ABNORMAL HIGH (ref 8–23)
CO2: 21 mmol/L — ABNORMAL LOW (ref 22–32)
Calcium: 9.1 mg/dL (ref 8.9–10.3)
Chloride: 101 mmol/L (ref 98–111)
Creatinine: 1.85 mg/dL — ABNORMAL HIGH (ref 0.44–1.00)
GFR, Estimated: 29 mL/min — ABNORMAL LOW (ref >60)
Glucose, Bld: 127 mg/dL — ABNORMAL HIGH (ref 70–99)
Potassium: 3.5 mmol/L (ref 3.5–5.1)
Sodium: 135 mmol/L (ref 135–145)
Total Bilirubin: 0.4 mg/dL (ref 0.3–1.2)
Total Protein: 8.1 g/dL (ref 6.5–8.1)

## 2023-06-10 LAB — MAGNESIUM: Magnesium: 1.9 mg/dL (ref 1.7–2.4)

## 2023-06-10 MED ORDER — LOPERAMIDE HCL 2 MG PO TABS: 4.0000 mg | ORAL_TABLET | Freq: Four times a day (QID) | ORAL | 0 refills | Status: DC | PRN

## 2023-06-10 MED ORDER — SODIUM CHLORIDE 0.9 % IV SOLN
Freq: Once | INTRAVENOUS | Status: AC
  Filled 2023-06-10: qty 250

## 2023-06-10 MED ORDER — FULVESTRANT 250 MG/5ML IM SOSY
500.0000 mg | PREFILLED_SYRINGE | Freq: Once | INTRAMUSCULAR | Status: AC
  Administered 2023-06-10: 500 mg via INTRAMUSCULAR
  Filled 2023-06-10: qty 10

## 2023-06-10 NOTE — Progress Notes (Addendum)
[facility]  [phone]  [phone]     Cassidy Norris DOB: August 01, 1951  MR#: [id number]  AOZ#:[id number]  Patient Care Team: Smitty Cords, DO as PCP - General (Family Medicine) Drema Dallas, DO as Consulting Physician (Neurology) Jeralyn Ruths, MD as Consulting Physician (Oncology) Benita Gutter, RN as Oncology Nurse Navigator  CHIEF COMPLAINT: Recurrent, metastatic ER/PR, HER2 negative invasive carcinoma of the breast.    INTERVAL HISTORY: Patient returns to clinic today for repeat laboratory work, further evaluation, and continuation of Kisqali and fulvestrant.  She has multiple complaints today that all appear to be related to increasing family stressors and possible worsening reflux.  She otherwise is tolerating her treatment without significant side effects. She has no new neurologic complaints.  She denies any recent fevers or illnesses.  She has had a poor appetite recently, but denies weight loss.  She has no chest pain, shortness of breath, cough, or hemoptysis.  She denies any nausea, vomiting, or constipation.  She continues to have occasional diarrhea.  She has no urinary complaints.  Patient offers no further specific complaints today.  REVIEW OF SYSTEMS:   Review of Systems  Constitutional: Negative.  Negative for fever, malaise/fatigue and weight loss.  Respiratory: Negative.  Negative for cough, hemoptysis and shortness of breath.   Cardiovascular: Negative.  Negative for chest pain and leg swelling.  Gastrointestinal: Negative.  Negative for abdominal pain.  Genitourinary: Negative.  Negative for dysuria.  Musculoskeletal: Negative.  Negative for joint pain and myalgias.  Skin: Negative.  Negative for rash.  Neurological: Negative.  Negative for dizziness, sensory change, focal weakness, weakness and headaches.  Psychiatric/Behavioral: Negative.  The patient is not nervous/anxious.     As per HPI. Otherwise, a  complete review of systems is negative.  ONCOLOGY HISTORY: Oncology History Overview Note  1. Bilateral carcinoma of breast, February, 2015. Right breast status post radical mastectomy. T1 C. N0M0 (isolated tumor cells in one lymph node) multifocal invasive cancer. Stage IC, Left breast, Status post radical mastectomy, T2, N1, M0 tumor. Stage II, RIght breast. Both tumors are estrogen receptor positive.  Progesterone receptor positive.  HER-2 receptor negative by FISH. Negative for BRCA mutation.  2. Started on Adriamycin and Cytoxan on March 01, 2014. Last cycle of Cytoxan and Adriamycin on May 01, 2014. Patient has finished last chemotherapy on September 9. (12 th dose was omitted because of neuropathy) 3.  Taking tamoxifen.  Patient had a poor tolerance to letrozole.(October, 2015) 4.  Patient is taking letrozole since November of 2015 5.  May, 2016 Letrozole has been put on hold because of bony pains 6.  Started on Aromasin from July of 2016    History of bilateral breast cancer (Resolved)  11/29/2013 Initial Diagnosis   Breast cancer bilateral, multifocal ER/PR pos, Her 2 neg,.     PAST MEDICAL HISTORY: Past Medical History:  Diagnosis Date   Anemia    Arthritis not really diagnosis but have pain   B12 deficiency 07/23/2016   Will check levels to determine if injections are needed again. Await results. Recent CBC at Onc WNL   Blood transfusion without reported diagnosis 1983 had a miscarriage/dnc   Cancer (HCC) 12/30/2013   Multifocal disease: pT1c,m, N0(isolated tumor cells) Er/ PR positive, Her 2 negative.   Cancer (HCC) 01/09/2014   Invasive lobular carcinoma, 2.2 cm, T2,N1 ER/PR positive, HER-2/neu negative   Cataract 2014?   Centrilobular emphysema (HCC) 10/27/2021   Complication of anesthesia 03/06/2022  Prolonged metabolism of Rocuronium during case.   Diabetes mellitus without complication (HCC)    Diffuse cystic mastopathy    Erosive esophagitis    Essential  hypertension, benign 09/24/2017   Family history of malignant neoplasm of gastrointestinal tract 2012   Fractured elbow 08/28/2014   Gastroesophageal reflux disease    Hx of metabolic acidosis with increased anion gap    Increased heart rate    Multiple sclerosis (HCC)    Neuromuscular disorder (HCC)    neuropathy in bil feet - left is worse.   Obesity, unspecified    Peripheral neuropathy    Personal history of tobacco use, presenting hazards to health    Restless leg syndrome    Sleep apnea    uses CPAP   Special screening for malignant neoplasms, colon    Squamous cell carcinoma of skin 08/24/2022   SCCIS - scalp, ED&C    PAST SURGICAL HISTORY: Past Surgical History:  Procedure Laterality Date   BREAST BIOPSY Right 1973,2001   BREAST BIOPSY Right 11/07/2013   INVASIVE MAMMARY CARCINOMA , ER/PR positive Her2 negative   BREAST BIOPSY Left 11/27/2013   invasive lobular and DCIS   BREAST SURGERY Bilateral 01/09/2014   mastectomy   cataract surgery Left 08/13/2022   CHOLECYSTECTOMY     COLONOSCOPY  2010   Dr. Evette Cristal   COLONOSCOPY WITH PROPOFOL N/A 06/11/2015   Procedure: COLONOSCOPY WITH PROPOFOL;  Surgeon: Kieth Brightly, MD;  Location: [facility] ENDOSCOPY;  Service: Endoscopy;  Laterality: N/A;   COLONOSCOPY WITH PROPOFOL N/A 12/23/2021   Procedure: COLONOSCOPY WITH PROPOFOL;  Surgeon: Toney Reil, MD;  Location: [facility] ENDOSCOPY;  Service: Gastroenterology;  Laterality: N/A;   DILATATION & CURETTAGE/HYSTEROSCOPY WITH MYOSURE N/A 07/12/2018   Procedure: DILATATION & CURETTAGE/HYSTEROSCOPY WITH MYOSURE WITH ENDOMETRIAL POLYPECTOMY;  Surgeon: Conard Novak, MD;  Location: [facility] ORS;  Service: Gynecology;  Laterality: N/A;   DILATION AND CURETTAGE OF UTERUS  1983   ESOPHAGOGASTRODUODENOSCOPY N/A 12/23/2021   Procedure: ESOPHAGOGASTRODUODENOSCOPY (EGD);  Surgeon: Toney Reil, MD;  Location: [facility] ENDOSCOPY;  Service: Gastroenterology;  Laterality: N/A;    JOINT REPLACEMENT  11/23/2015   KNEE ARTHROPLASTY Right 03/06/2022   Procedure: COMPUTER ASSISTED TOTAL KNEE ARTHROPLASTY;  Surgeon: Donato Heinz, MD;  Location: [facility] ORS;  Service: Orthopedics;  Laterality: Right;   POLYPECTOMY  1989   PORTA CATH REMOVAL     PORTACATH PLACEMENT  2015   SKIN BIOPSY     on scalp   SPINE SURGERY  09/14/2014   Ruptured disk L4- L5   TOTAL KNEE ARTHROPLASTY Left 11/22/2015   Procedure: TOTAL KNEE ARTHROPLASTY;  Surgeon: Frederico Hamman, MD;  Location: [facility] OR;  Service: Orthopedics;  Laterality: Left;   TUBAL LIGATION     WISDOM TOOTH EXTRACTION  2006    FAMILY HISTORY Family History  Problem Relation Age of Onset   Cancer Mother        lung   COPD Mother    Cancer Father        colon, stomach   Diabetes Maternal Grandmother    Hypertension Sister    Rectal cancer Paternal Uncle    Rectal cancer Paternal Uncle     GYNECOLOGIC HISTORY:  Patient's last menstrual period was 12/22/1999 (approximate).     ADVANCED DIRECTIVES:    HEALTH MAINTENANCE: Social History   Tobacco Use   Smoking status: Former    Current packs/day: 0.00    Average packs/day: 1 pack/day for 30.0 years (30.0 ttl pk-yrs)  Types: Cigarettes    Start date: 12/12/1983    Quit date: 12/11/2013    Years since quitting: 9.5   Smokeless tobacco: Former  Building services engineer status: Never Used  Substance Use Topics   Alcohol use: Not Currently   Drug use: No     Colonoscopy:  PAP:  Bone density:  Mammogram:  No Known Allergies  Current Outpatient Medications  Medication Sig Dispense Refill   acetaminophen (TYLENOL) 500 MG tablet Take 1,000 mg by mouth 2 (two) times daily as needed for moderate pain or headache.     baclofen (LIORESAL) 10 MG tablet Take 1 tablet (10 mg total) by mouth 3 (three) times daily as needed for muscle spasms. 1080 tablet 0   calcium carbonate (OSCAL) 1500 (600 Ca) MG TABS tablet Take by mouth 2 (two) times daily with a meal.      diclofenac Sodium (VOLTAREN) 1 % GEL Apply 2 g topically 3 (three) times daily as needed. 100 g 2   fluticasone (FLONASE) 50 MCG/ACT nasal spray PLACE 2 SPRAYS INTO BOTH NOSTRILS AT BEDTIME. 48 g 1   fulvestrant (FASLODEX) 250 MG/5ML injection Inject 500 mg into the muscle every 30 (thirty) days. One injection each buttock over 1-2 minutes. Warm prior to use.     Krill Oil 1000 MG CAPS Take 1,000 mg by mouth daily.     lisinopril (ZESTRIL) 5 MG tablet Take 1 tablet (5 mg total) by mouth daily. 90 tablet 3   metFORMIN (GLUCOPHAGE) 500 MG tablet Take 1 tablet (500 mg total) by mouth 2 (two) times daily with a meal. 180 tablet 3   mometasone (ELOCON) 0.1 % cream Apply to affected skin qd-bid prn flares 45 g 2   ondansetron (ZOFRAN) 8 MG tablet Take 1 tablet (8 mg total) by mouth every 8 (eight) hours as needed for nausea or vomiting. 20 tablet 0   prochlorperazine (COMPAZINE) 10 MG tablet Take 1 tablet (10 mg total) by mouth every 6 (six) hours as needed for nausea or vomiting. 20 tablet 1   ribociclib succ (KISQALI, 600 MG DOSE,) 200 MG Therapy Pack Take 3 tablets (600 mg total) by mouth daily. Take for 21 days on, 7 days off, repeat every 28 days. 63 tablet 5   sodium chloride (OCEAN) 0.65 % SOLN nasal spray Place 1 spray into both nostrils as needed for congestion.     traZODone (DESYREL) 50 MG tablet Take 1 tablet (50 mg total) by mouth at bedtime as needed. for sleep 90 tablet 3   venlafaxine XR (EFFEXOR-XR) 75 MG 24 hr capsule Take 1 capsule (75 mg total) by mouth daily with breakfast. 360 capsule 0   vitamin B-12 (CYANOCOBALAMIN) 1000 MCG tablet Take 2,500 mcg by mouth every Monday, Wednesday, and Friday.     Vitamin D, Cholecalciferol, 50 MCG (2000 UT) CAPS Take 2,000 Units by mouth every Monday, Wednesday, and Friday.     loperamide (IMODIUM A-D) 2 MG tablet Take 2 tablets (4 mg total) by mouth 4 (four) times daily as needed for diarrhea or loose stools. 30 tablet 0   No current  facility-administered medications for this visit.    OBJECTIVE: BP (!) 162/69 (BP Location: Left Arm, Patient Position: Sitting, Cuff Size: Normal)   Pulse 88   Temp (!) 97.1 F (36.2 C) (Tympanic)   Resp 16   Ht 5\' 5"  (1.651 m)   Wt 192 lb (87.1 kg)   LMP 12/22/1999 (Approximate)   SpO2 99%  BMI 31.95 kg/m    Body mass index is 31.95 kg/m.    ECOG FS:0 - Asymptomatic  General: Well-developed, well-nourished, no acute distress. Eyes: Pink conjunctiva, anicteric sclera. HEENT: Normocephalic, moist mucous membranes. Lungs: No audible wheezing or coughing. Heart: Regular rate and rhythm. Abdomen: Soft, nontender, no obvious distention. Musculoskeletal: No edema, cyanosis, or clubbing. Neuro: Alert, answering all questions appropriately. Cranial nerves grossly intact. Skin: No rashes or petechiae noted. Psych: Normal affect.  LAB RESULTS:  Appointment on 06/10/2023  Component Date Value Ref Range Status   Magnesium 06/10/2023 1.9  1.7 - 2.4 mg/dL Final   Performed at [facility], [address] [place], [place] [address]   Sodium 06/10/2023 135  135 - 145 mmol/L Final   Potassium 06/10/2023 3.5  3.5 - 5.1 mmol/L Final   Chloride 06/10/2023 101  98 - 111 mmol/L Final   CO2 06/10/2023 21 (L)  22 - 32 mmol/L Final   Glucose, Bld 06/10/2023 127 (H)  70 - 99 mg/dL Final   Glucose reference range applies only to samples taken after fasting for at least 8 hours.   BUN 06/10/2023 28 (H)  8 - 23 mg/dL Final   Creatinine 40/98/1191 1.85 (H)  0.44 - 1.00 mg/dL Final   Calcium 47/82/9562 9.1  8.9 - 10.3 mg/dL Final   Total Protein 13/06/6577 8.1  6.5 - 8.1 g/dL Final   Albumin 46/96/2952 4.3  3.5 - 5.0 g/dL Final   AST 84/13/2440 32  15 - 41 U/L Final   ALT 06/10/2023 19  0 - 44 U/L Final   Alkaline Phosphatase 06/10/2023 68  38 - 126 U/L Final   Total Bilirubin 06/10/2023 0.4  0.3 - 1.2 mg/dL Final   GFR, Estimated 06/10/2023 29 (L)  >60 mL/min Final   Comment:  (NOTE) Calculated using the CKD-EPI Creatinine Equation (2021)    Anion gap 06/10/2023 13  5 - 15 Final   Performed at [facility], [address] [place], [place] [address]   WBC 06/10/2023 4.1  4.0 - 10.5 K/uL Final   RBC 06/10/2023 2.66 (L)  3.87 - 5.11 MIL/uL Final   Hemoglobin 06/10/2023 9.9 (L)  12.0 - 15.0 g/dL Final   HCT 53/66/4403 28.4 (L)  36.0 - 46.0 % Final   MCV 06/10/2023 106.8 (H)  80.0 - 100.0 fL Final   MCH 06/10/2023 37.2 (H)  26.0 - 34.0 pg Final   MCHC 06/10/2023 34.9  30.0 - 36.0 g/dL Final   RDW 47/42/5956 14.7  11.5 - 15.5 % Final   Platelets 06/10/2023 206  150 - 400 K/uL Final   nRBC 06/10/2023 0.0  0.0 - 0.2 % Final   Neutrophils Relative % 06/10/2023 45  % Final   Neutro Abs 06/10/2023 1.9  1.7 - 7.7 K/uL Final   Lymphocytes Relative 06/10/2023 32  % Final   Lymphs Abs 06/10/2023 1.3  0.7 - 4.0 K/uL Final   Monocytes Relative 06/10/2023 18  % Final   Monocytes Absolute 06/10/2023 0.7  0.1 - 1.0 K/uL Final   Eosinophils Relative 06/10/2023 2  % Final   Eosinophils Absolute 06/10/2023 0.1  0.0 - 0.5 K/uL Final   Basophils Relative 06/10/2023 3  % Final   Basophils Absolute 06/10/2023 0.1  0.0 - 0.1 K/uL Final   Immature Granulocytes 06/10/2023 0  % Final   Abs Immature Granulocytes 06/10/2023 0.01  0.00 - 0.07 K/uL Final   Performed at [facility], [address] [place],  [place] [address]    STUDIES: No results found.  ONCOLOGY HISTORY:  Bilateral adenocarcinoma of the breast. Patient is status post bilateral mastectomy in February 2015. She also received adjuvant chemotherapy with Adriamycin, Cytoxan, and Taxol completing treatment in approximately October 2015. She then initiated letrozole, but could not tolerate secondary to leg pain and was switched to Aromasin.  Patient then switched to tamoxifen in October 2019 secondary to cost.  Patient subsequently discontinued tamoxifen and did not complete the recommended 5 years of treatment.    ASSESSMENT: Recurrent, metastatic ER/PR, HER2 negative invasive carcinoma of the breast.   PLAN:    Recurrent, metastatic ER/PR, HER2 negative invasive carcinoma of the breast: See oncology history as above.  Biopsy confirmed recurrent disease.  PET scan results from December 26, 2021 reviewed independently with hypermetabolic left supraclavicular, subpectoral, and axillary lymphadenopathy consistent with nodal recurrence of patient's history of breast cancer.  No distant metastases were identified.  Her most recent PET scan on May 26, 2023 reviewed independently with improved disease burden. Her initial CA 27-29 initially was 260.1.  Now seems to have plateaued between 81.1 and 91.  Her most recent result was 90.4.  Continue Kisqali 600 mg daily for 21 days with 7 days off.  Proceed with fulvestrant today.  Return to clinic in 4 weeks for further evaluation and continuation of treatment. Osteopenia: Patient's last bone mineral density was on August 01, 2016 and revealed a T-score of -1.2. Continue monitoring bone mineral density by PCP. Multiple sclerosis: Chronic and.  Continue evaluation and treatment per neurology.  Recent MRI of the brain did not reveal any new lesions. Renal insufficiency: Chronic and unchanged.  Patient's GFR is 29 today.  Kisqali may need to be dose reduced if GFR remains persistently less than 30. Anemia: Hemoglobin improved to 9.9, monitor. Diarrhea: Patient does not complain of this today.  Continue Imodium as needed.  Reflux: Patient was given a refill of her omeprazole today. Poor appetite: Patient will receive 1 L of IV fluids today.  Patient expressed understanding and was in agreement with this plan. She also understands that She can call clinic at any time with any questions, concerns, or complaints.   Breast cancer bilateral, multifocal ER/PR pos, Her 2 neg.   Staging form: Breast, AJCC 7th Edition     Clinical: Stage IIB (T2, N1, M0) - Signed by Johney Maine,  MD on 04/22/2015   Jeralyn Ruths, MD   06/10/2023 1:11 PM

## 2023-06-10 NOTE — Progress Notes (Signed)
Neck cramping when turning head sometimes. Having "nerve jumping" in right side

## 2023-06-11 ENCOUNTER — Encounter: Payer: Self-pay | Admitting: Oncology

## 2023-06-11 ENCOUNTER — Other Ambulatory Visit: Payer: Self-pay | Admitting: *Deleted

## 2023-06-11 LAB — CANCER ANTIGEN 27.29: CA 27.29: 96.1 U/mL — ABNORMAL HIGH (ref 0.0–38.6)

## 2023-06-11 LAB — CA 125: Cancer Antigen (CA) 125: 8.3 U/mL (ref 0.0–38.1)

## 2023-06-11 MED ORDER — OMEPRAZOLE 40 MG PO CPDR: 40.0000 mg | DELAYED_RELEASE_CAPSULE | Freq: Two times a day (BID) | ORAL | 2 refills | Status: DC

## 2023-06-16 ENCOUNTER — Inpatient Hospital Stay (HOSPITAL_BASED_OUTPATIENT_CLINIC_OR_DEPARTMENT_OTHER): Payer: Medicare Other | Admitting: Obstetrics and Gynecology

## 2023-06-16 ENCOUNTER — Inpatient Hospital Stay: Payer: Medicare Other

## 2023-06-16 VITALS — BP 134/67 | HR 93 | Temp 97.8°F | Resp 20 | Wt 190.9 lb

## 2023-06-16 DIAGNOSIS — R978 Other abnormal tumor markers: Secondary | ICD-10-CM

## 2023-06-16 DIAGNOSIS — Z79818 Long term (current) use of other agents affecting estrogen receptors and estrogen levels: Secondary | ICD-10-CM | POA: Diagnosis not present

## 2023-06-16 DIAGNOSIS — N838 Other noninflammatory disorders of ovary, fallopian tube and broad ligament: Secondary | ICD-10-CM

## 2023-06-16 DIAGNOSIS — Z170 Estrogen receptor positive status [ER+]: Secondary | ICD-10-CM | POA: Diagnosis not present

## 2023-06-16 DIAGNOSIS — R971 Elevated cancer antigen 125 [CA 125]: Secondary | ICD-10-CM | POA: Diagnosis not present

## 2023-06-16 DIAGNOSIS — Z79899 Other long term (current) drug therapy: Secondary | ICD-10-CM | POA: Diagnosis not present

## 2023-06-16 DIAGNOSIS — Z9013 Acquired absence of bilateral breasts and nipples: Secondary | ICD-10-CM | POA: Diagnosis not present

## 2023-06-16 DIAGNOSIS — Z853 Personal history of malignant neoplasm of breast: Secondary | ICD-10-CM | POA: Diagnosis not present

## 2023-06-16 DIAGNOSIS — D3912 Neoplasm of uncertain behavior of left ovary: Secondary | ICD-10-CM | POA: Diagnosis not present

## 2023-06-16 DIAGNOSIS — C778 Secondary and unspecified malignant neoplasm of lymph nodes of multiple regions: Secondary | ICD-10-CM | POA: Diagnosis not present

## 2023-06-16 NOTE — Progress Notes (Signed)
Gynecologic Oncology Consult Visit   Referring Provider: Dr. Orlie Dakin  Chief Complaint: Left Ovarian Mass  Subjective:  Cassidy Norris is a 72 y.o. female who is seen in consultation from Dr. Orlie Dakin for incidental PET positive left ovarian mass.   Patient diagnosed with recurrent metastatic breast cancer, metastatic to axillary LN, left supraclavicular LN, hx of bilateral mastectomy and chemo, currently on Kisqali, a CDK inhibitor, and fulvestrant, who on restaging PET on 05/03/23 she was found to have: left ovary, mildly prominent measuring 2.8 x 2.7 with maximum SUV of 6.6. Size is similar to previous imaging but SUV was previously noted to be 3.6. No other abdominal lymphadenopathy.   Pelvic US was performed on 05/26/23 which showed:  - Uterus: Measurements: 5.8 x 3.2 x 4.7 centimeters = volume: 44.6 mL. The uterus is anteverted. Myometrium is heterogeneous. A posterior uterine fibroid is 1.7 x 1.2 x 1.4 centimeters. Nabothian cyst is present in the cervix. - Endometrium: Thickness: 2.9 millimeters.  No focal abnormality visualized. - Right ovary: Measurements: 2.3 x 1.6 x 1.3 centimeters = volume: 2.4 mL. Normal appearance/no adnexal mass. RIGHT ovary is only seen on transabdominal imaging - Left ovary: Measurements: 3.5 x 2.4 x 3.4 centimeters = volume: 15.0 mL. A heterogeneous hypoechoic mass in the central portion of the LEFT ovary is 2.7 x 2.1 x 2.7 centimeters and correlates well with the recent PET-CT. Internal blood flow is identified within this portion of the ovary. - Other findings: Trace amount free pelvic fluid  CA 125  06/10/23 8.3  CA 27.29 06/10/23 96.1 (elevated)  She has previously seen Dr Jean Rosenthal and underwent D&C hysteroscopy, and endometrial polypectomy for PMB and endometrial polyp.   07/12/2018- DIAGNOSIS:  A. ENDOMETRIAL CURETTINGS:  - STRIPS OF BENIGN ENDOCERVICAL GLANDULAR EPITHELIUM WITH FOCAL ACUTE INFLAMMATION AND SQUAMOUS METAPLASIA.  - STRIPS OF ATROPHIC  ENDOMETRIAL GLANDULAR EPITHELIUM.  - BLOOD.  - FOCUS OF NECROTIC DEBRIS.  - NEGATIVE FOR ATYPIA AND MALIGNANCY.   B.  ENDOMETRIAL POLYP:  - ENDOMETRIAL POLYP.  - NEGATIVE FOR ATYPIA AND MALIGNANCY.   09/2017- Pap - NILM  States she had a pap a few years ago that   More recently she established care with Dr Shawnie Pons at [facility] due to noticing bumps on her vagina that had enlarged. On exam, they were thought to be small inclusions on labia minora and observation was recommended.  was normal.   She has had excessive face hair and shaves since being diagnosed with MS about 20 years ago.  Says some increased with chemo in 2015 and some permanent alopecia.  Some deepening of voice.     Problem List: Patient Active Problem List   Diagnosis Date Noted   Total knee replacement status 03/06/2022   Recurrent breast cancer (HCC) 12/30/2021   Erosive esophagitis    Polyp of descending colon    Centrilobular emphysema (HCC) 10/27/2021   S/P total knee arthroplasty, left 08/24/2021   Aortic atherosclerosis (HCC) 07/08/2020   Elevated LFTs 01/04/2020   Drug-induced myopathy 04/21/2019   Statin intolerance 07/22/2018   Multiple sclerosis (HCC) 07/22/2018   Endometrial polyp 06/27/2018   Postmenopausal bleeding 06/27/2018   Gastroesophageal reflux disease 01/20/2018   History of non anemic vitamin B12 deficiency 10/22/2017   Former smoker 09/24/2017   History of bilateral breast cancer 09/24/2017   Essential hypertension, benign 09/24/2017   Hyperlipidemia associated with type 2 diabetes mellitus (HCC) 07/13/2017   Microalbuminuria 01/12/2017   Type 2 diabetes mellitus with  other specified complication (HCC) 09/02/2016   Osteoarthritis of knee 11/22/2015   Personal history of colonic polyps 10/10/2015   Family history of colon cancer 10/10/2015   Elevated fasting blood sugar 09/23/2015   Restless leg 07/22/2015   Insomnia 07/22/2015   Peripheral neuropathy due to  chemotherapy (HCC) 03/19/2015   Lumbar radiculopathy 03/19/2015   Family history of cancer of digestive system     Past Medical History: Past Medical History:  Diagnosis Date   Anemia    Arthritis not really diagnosis but have pain   B12 deficiency 07/23/2016   Will check levels to determine if injections are needed again. Await results. Recent CBC at Onc [facility]   Blood transfusion without reported diagnosis 1983 had a miscarriage/dnc   Cancer (HCC) 12/30/2013   Multifocal disease: pT1c,m, N0(isolated tumor cells) Er/ PR positive, Her 2 negative.   Cancer (HCC) 01/09/2014   Invasive lobular carcinoma, 2.2 cm, T2,N1 ER/PR positive, HER-2/neu negative   Cataract 2014?   Centrilobular emphysema (HCC) 10/27/2021   Complication of anesthesia 03/06/2022   Prolonged metabolism of Rocuronium during case.   Diabetes mellitus without complication (HCC)    Diffuse cystic mastopathy    Erosive esophagitis    Essential hypertension, benign 09/24/2017   Family history of malignant neoplasm of gastrointestinal tract 2012   Fractured elbow 08/28/2014   Gastroesophageal reflux disease    Hx of metabolic acidosis with increased anion gap    Increased heart rate    Multiple sclerosis (HCC)    Neuromuscular disorder (HCC)    neuropathy in bil feet - left is worse.   Obesity, unspecified    Peripheral neuropathy    Personal history of tobacco use, presenting hazards to health    Restless leg syndrome    Sleep apnea    uses CPAP   Special screening for malignant neoplasms, colon    Squamous cell carcinoma of skin 08/24/2022   SCCIS - scalp, ED&C    Past Surgical History: Past Surgical History:  Procedure Laterality Date   BREAST BIOPSY Right 1973,2001   BREAST BIOPSY Right 11/07/2013   INVASIVE MAMMARY CARCINOMA , ER/PR positive Her2 negative   BREAST BIOPSY Left 11/27/2013   invasive lobular and DCIS   BREAST SURGERY Bilateral 01/09/2014   mastectomy   cataract surgery Left 08/13/2022    CHOLECYSTECTOMY     COLONOSCOPY  2010   Dr. Evette Cristal   COLONOSCOPY WITH PROPOFOL N/A 06/11/2015   Procedure: COLONOSCOPY WITH PROPOFOL;  Surgeon: Kieth Brightly, MD;  Location: [facility] ENDOSCOPY;  Service: Endoscopy;  Laterality: N/A;   COLONOSCOPY WITH PROPOFOL N/A 12/23/2021   Procedure: COLONOSCOPY WITH PROPOFOL;  Surgeon: Toney Reil, MD;  Location: [facility] ENDOSCOPY;  Service: Gastroenterology;  Laterality: N/A;   DILATATION & CURETTAGE/HYSTEROSCOPY WITH MYOSURE N/A 07/12/2018   Procedure: DILATATION & CURETTAGE/HYSTEROSCOPY WITH MYOSURE WITH ENDOMETRIAL POLYPECTOMY;  Surgeon: Conard Novak, MD;  Location: [facility] ORS;  Service: Gynecology;  Laterality: N/A;   DILATION AND CURETTAGE OF UTERUS  1983   ESOPHAGOGASTRODUODENOSCOPY N/A 12/23/2021   Procedure: ESOPHAGOGASTRODUODENOSCOPY (EGD);  Surgeon: Toney Reil, MD;  Location: [facility] ENDOSCOPY;  Service: Gastroenterology;  Laterality: N/A;   JOINT REPLACEMENT  11/23/2015   KNEE ARTHROPLASTY Right 03/06/2022   Procedure: COMPUTER ASSISTED TOTAL KNEE ARTHROPLASTY;  Surgeon: Donato Heinz, MD;  Location: [facility] ORS;  Service: Orthopedics;  Laterality: Right;   POLYPECTOMY  1989   PORTA CATH REMOVAL     PORTACATH PLACEMENT  2015   SKIN BIOPSY  on scalp   SPINE SURGERY  09/14/2014   Ruptured disk L4- L5   TOTAL KNEE ARTHROPLASTY Left 11/22/2015   Procedure: TOTAL KNEE ARTHROPLASTY;  Surgeon: Frederico Hamman, MD;  Location: [facility] OR;  Service: Orthopedics;  Laterality: Left;   TUBAL LIGATION     WISDOM TOOTH EXTRACTION  2006    OB History:  OB History  Gravida Para Term Preterm AB Living  1       1    SAB IAB Ectopic Multiple Live Births  1            # Outcome Date GA Lbr Len/2nd Weight Sex Type Anes PTL Lv  1 SAB             Obstetric Comments  Age with first menstruation-13  Age with first pregnancy-31    Family History: Family History  Problem Relation Age of Onset   Cancer Mother        lung   COPD  Mother    Cancer Father        colon, stomach   Diabetes Maternal Grandmother    Hypertension Sister    Rectal cancer Paternal Uncle    Rectal cancer Paternal Uncle     Social History: Social History   Socioeconomic History   Marital status: Widowed    Spouse name: Not on file   Number of children: Not on file   Years of education: some college   Highest education level: High school graduate  Occupational History   Occupation: retired  Tobacco Use   Smoking status: Former    Current packs/day: 0.00    Average packs/day: 1 pack/day for 30.0 years (30.0 ttl pk-yrs)    Types: Cigarettes    Start date: 12/12/1983    Quit date: 12/11/2013    Years since quitting: 9.5   Smokeless tobacco: Former  Building services engineer status: Never Used  Substance and Sexual Activity   Alcohol use: Not Currently   Drug use: No   Sexual activity: Not Currently    Birth control/protection: Post-menopausal  Other Topics Concern   Not on file  Social History Narrative   Right handed   Drinks caffeine   One story home   Social Determinants of Health   Financial Resource Strain: Low Risk  (05/09/2019)   Overall Financial Resource Strain (CARDIA)    Difficulty of Paying Living Expenses: Not hard at all  Food Insecurity: No Food Insecurity (05/09/2019)   Hunger Vital Sign    Worried About Running Out of Food in the Last Year: Never true    Ran Out of Food in the Last Year: Never true  Transportation Needs: No Transportation Needs (05/09/2019)   PRAPARE - Administrator, Civil Service (Medical): No    Lack of Transportation (Non-Medical): No  Physical Activity: Inactive (05/09/2019)   Exercise Vital Sign    Days of Exercise per Week: 0 days    Minutes of Exercise per Session: 0 min  Stress: Not on file  Social Connections: Not on file  Intimate Partner Violence: Not on file    Allergies: No Known Allergies  Current Medications: Current Outpatient Medications  Medication Sig  Dispense Refill   acetaminophen (TYLENOL) 500 MG tablet Take 1,000 mg by mouth 2 (two) times daily as needed for moderate pain or headache.     baclofen (LIORESAL) 10 MG tablet Take 1 tablet (10 mg total) by mouth 3 (three) times daily as needed for muscle spasms.  1080 tablet 0   calcium carbonate (OSCAL) 1500 (600 Ca) MG TABS tablet Take by mouth 2 (two) times daily with a meal.     diclofenac Sodium (VOLTAREN) 1 % GEL Apply 2 g topically 3 (three) times daily as needed. 100 g 2   fluticasone (FLONASE) 50 MCG/ACT nasal spray PLACE 2 SPRAYS INTO BOTH NOSTRILS AT BEDTIME. 48 g 1   fulvestrant (FASLODEX) 250 MG/5ML injection Inject 500 mg into the muscle every 30 (thirty) days. One injection each buttock over 1-2 minutes. Warm prior to use.     Krill Oil 1000 MG CAPS Take 1,000 mg by mouth daily.     lisinopril (ZESTRIL) 5 MG tablet Take 1 tablet (5 mg total) by mouth daily. 90 tablet 3   loperamide (IMODIUM A-D) 2 MG tablet Take 2 tablets (4 mg total) by mouth 4 (four) times daily as needed for diarrhea or loose stools. 30 tablet 0   metFORMIN (GLUCOPHAGE) 500 MG tablet Take 1 tablet (500 mg total) by mouth 2 (two) times daily with a meal. 180 tablet 3   mometasone (ELOCON) 0.1 % cream Apply to affected skin qd-bid prn flares 45 g 2   omeprazole (PRILOSEC) 40 MG capsule Take 1 capsule (40 mg total) by mouth 2 (two) times daily. 60 capsule 2   ondansetron (ZOFRAN) 8 MG tablet Take 1 tablet (8 mg total) by mouth every 8 (eight) hours as needed for nausea or vomiting. 20 tablet 0   prochlorperazine (COMPAZINE) 10 MG tablet Take 1 tablet (10 mg total) by mouth every 6 (six) hours as needed for nausea or vomiting. 20 tablet 1   ribociclib succ (KISQALI, 600 MG DOSE,) 200 MG Therapy Pack Take 3 tablets (600 mg total) by mouth daily. Take for 21 days on, 7 days off, repeat every 28 days. 63 tablet 5   sodium chloride (OCEAN) 0.65 % SOLN nasal spray Place 1 spray into both nostrils as needed for congestion.      traZODone (DESYREL) 50 MG tablet Take 1 tablet (50 mg total) by mouth at bedtime as needed. for sleep 90 tablet 3   venlafaxine XR (EFFEXOR-XR) 75 MG 24 hr capsule Take 1 capsule (75 mg total) by mouth daily with breakfast. 360 capsule 0   vitamin B-12 (CYANOCOBALAMIN) 1000 MCG tablet Take 2,500 mcg by mouth every Monday, Wednesday, and Friday.     Vitamin D, Cholecalciferol, 50 MCG (2000 UT) CAPS Take 2,000 Units by mouth every Monday, Wednesday, and Friday.     No current facility-administered medications for this visit.    Review of Systems General: positive for weight gain, generalized weakness Skin: negative for changes in color, texture, moles or lesions Eyes: negative for, changes in vision, pain, diplopia HEENT: negative for, change in hearing, pain, discharge, vertigo, voice changes, sore throat, neck masses. Positive for tinnitus and vision changes.  Pulmonary: negative for, dyspnea, orthopnea, productive cough Cardiac: negative for, palpitations, syncope, pain, discomfort, pressure. Positive for claudication Gastrointestinal: positive for nausea, vomiting, diarrhea, black stools Genitourinary/Sexual: positive for incomplete emptying of bladder Musculoskeletal: positive for pain of legs, joints, and back Hematology: positive for easy bruising Neurologic/Psych:positive for numbness  Objective:  Physical Examination:  BP 134/67   Pulse 93   Temp 97.8 F (36.6 C)   Resp 20   Wt 190 lb 14.4 oz (86.6 kg)   LMP 12/22/1999 (Approximate)   SpO2 100%   BMI 31.77 kg/m     ECOG Performance Status: 1 - Symptomatic but completely ambulatory  General appearance: no acute distress. Alopecia. Facial hair HEENT: sclera clear Neck: no thyroid enlargement or cervical adenopathy Lymph node survey: non-palpable, axillary, inguinal, supraclavicular Cardiovascular: without murmurs, rubs or gallops Respiratory: clear to auscultation Abdomen: without hepatosplenomegaly Back:  inspection of back is normal Extremities: no lower extremity edema Skin exam - normal coloration and turgor, no rashes, no suspicious skin lesions noted. Neurological exam reveals alert, oriented, normal speech, no focal findings or movement disorder noted.  Pelvic: Exam chaperoned by CMA EGBUS: sebaceous cysts on left labia, not infected Cervix: no lesions, nontender, mobile Vagina: no lesions, no discharge or bleeding Uterus: normal size, nontender, mobile Adnexa: no palpable masses Rectovaginal: confirmatory   Lab Results  Component Value Date   WBC 4.1 06/10/2023   HGB 9.9 (L) 06/10/2023   HCT 28.4 (L) 06/10/2023   MCV 106.8 (H) 06/10/2023   PLT 206 06/10/2023     Chemistry      Component Value Date/Time   NA 135 06/10/2023 0946   NA 142 07/14/2019 1516   NA 139 03/11/2015 1356   K 3.5 06/10/2023 0946   K 3.4 (L) 03/11/2015 1356   CL 101 06/10/2023 0946   CL 103 03/11/2015 1356   CO2 21 (L) 06/10/2023 0946   CO2 28 03/11/2015 1356   BUN 28 (H) 06/10/2023 0946   BUN 12 07/14/2019 1516   BUN 13 03/11/2015 1356   CREATININE 1.85 (H) 06/10/2023 0946   CREATININE 0.85 10/20/2021 0838      Component Value Date/Time   CALCIUM 9.1 06/10/2023 0946   CALCIUM 9.2 03/11/2015 1356   ALKPHOS 68 06/10/2023 0946   ALKPHOS 68 03/11/2015 1356   AST 32 06/10/2023 0946   ALT 19 06/10/2023 0946   ALT 32 03/11/2015 1356   BILITOT 0.4 06/10/2023 0946     Radiographic Imaging:  Imaging reviewed per HPI, Assessment, and Plan     Assessment:  MLISS WEDIN is a 72 y.o. female diagnosed with metastatic breast cancer and persistent small solid/heterogenous left ovarian mass, 3 cm.  She is taking cdk inhibitor and antiestrogen for metastatic breast cancer, and this is in good control with response of supraclavicular nodal disease on serial PET scans.  She was referred because of concern that the SUV of the left ovarian mass has increased from 3.6 last year to 6.6 recently.  However,  I reviewed these scans with the radiologist and he is not certain there has actually been a change.  The two PET scans were done with different machines.  She has female pattern balding and increased facial hair concerning for androgen excess that could be related to an androgen producing ovarian tumor.    History of PMB 2019 with polyp on D&C.  No PMB now.  Normal PAPs.  Vulvar sebaceous cysts.  Medical co-morbidities complicating care: breast cancer CKD, type 2 diabetes, MS, GERD, aortic atherosclerosis, emphysema and others.  Plan:   Problem List Items Addressed This Visit       Other   Ovarian mass, left   Other Visit Diagnoses     Ovarian mass    -  Primary   Relevant Orders   Testosterone, % free   Testosterone   AFP tumor marker   Inhibin A   Inhibin B   Other abnormal tumor markers       Relevant Orders   AFP tumor marker       We discussed options for management including blood markers to evaluate for solid ovarian tumor  with hirsutism: Free and total testosterone, AFP, inhibin A/B.  If markers are concerning for sex cord stromal ovarian tumor will recommend surgery to remove ovaries and tubes and possible hysterectomy and staging.  If markers are negative, surgery would still be an option, as would watchful waiting with exams and periodic imaging.  The mass has not changed in size over the past 19 months.    Discussed that sebaceous cysts of vulva could be treated with sitz baths.    The patient's diagnosis, an outline of the further diagnostic and laboratory studies which will be required, the recommendation for surgery, and alternatives were discussed with her and her accompanying family members.  All questions were answered to their satisfaction.  A total of 60 minutes were spent with the patient/family today; 50% was spent in education, counseling and coordination of care for ovarian mass.    Alinda Dooms, NP  I personally interviewed and examined the patient.  Agreed with the above/below plan of care. I have directly contributed to assessment and plan of care of this patient and educated and discussed with patient and family.  Leida Lauth, MD  CC:  Smitty Cords, DO [address] [place],  [place] [address] [phone]

## 2023-06-17 LAB — AFP TUMOR MARKER: AFP, Serum, Tumor Marker: 2.4 ng/mL (ref 0.0–9.2)

## 2023-06-17 LAB — TESTOSTERONE: Testosterone: 239 ng/dL — ABNORMAL HIGH (ref 3–67)

## 2023-06-21 DIAGNOSIS — N184 Chronic kidney disease, stage 4 (severe): Secondary | ICD-10-CM | POA: Diagnosis not present

## 2023-06-21 DIAGNOSIS — N189 Chronic kidney disease, unspecified: Secondary | ICD-10-CM | POA: Diagnosis not present

## 2023-06-21 DIAGNOSIS — I10 Essential (primary) hypertension: Secondary | ICD-10-CM | POA: Diagnosis not present

## 2023-06-21 DIAGNOSIS — I129 Hypertensive chronic kidney disease with stage 1 through stage 4 chronic kidney disease, or unspecified chronic kidney disease: Secondary | ICD-10-CM | POA: Diagnosis not present

## 2023-06-21 LAB — PROTEIN / CREATININE RATIO, URINE: Creatinine, Urine: 74

## 2023-06-21 LAB — MICROALBUMIN / CREATININE URINE RATIO: Microalb Creat Ratio: 12

## 2023-06-21 LAB — MICROALBUMIN, URINE: Microalb, Ur: 0.9

## 2023-07-07 ENCOUNTER — Inpatient Hospital Stay: Payer: Medicare Other | Attending: Oncology | Admitting: Obstetrics and Gynecology

## 2023-07-07 VITALS — BP 109/69 | HR 119 | Temp 97.6°F | Resp 19 | Wt 183.4 lb

## 2023-07-07 DIAGNOSIS — L723 Sebaceous cyst: Secondary | ICD-10-CM | POA: Insufficient documentation

## 2023-07-07 DIAGNOSIS — Z853 Personal history of malignant neoplasm of breast: Secondary | ICD-10-CM | POA: Diagnosis not present

## 2023-07-07 DIAGNOSIS — Z9013 Acquired absence of bilateral breasts and nipples: Secondary | ICD-10-CM | POA: Diagnosis not present

## 2023-07-07 DIAGNOSIS — C778 Secondary and unspecified malignant neoplasm of lymph nodes of multiple regions: Secondary | ICD-10-CM | POA: Insufficient documentation

## 2023-07-07 DIAGNOSIS — D3912 Neoplasm of uncertain behavior of left ovary: Secondary | ICD-10-CM | POA: Diagnosis not present

## 2023-07-07 DIAGNOSIS — N838 Other noninflammatory disorders of ovary, fallopian tube and broad ligament: Secondary | ICD-10-CM

## 2023-07-07 NOTE — Patient Instructions (Addendum)
Your surgery will be scheduled August 04, 2023 You will have a pre-admission telephone visit prior to surgery. This visit will take around 40-45 minutes. Gyn oncology will see you 4-6 weeks following surgery for a post operative visit.  STOP [facility] on August 28th                                                      Laparoscopy Laparoscopy is a procedure to diagnose diseases in the abdomen. During the procedure, a thin, lighted, pencil-sized instrument called a laparoscope is inserted into the abdomen through an incision. The laparoscope allows your health care provider to look at the organs inside your body. LET [facility] CARE PROVIDER KNOW ABOUT: Any allergies you have. All medicines you are taking, including vitamins, herbs, eye drops, creams, and over-the-counter medicines. Previous problems you or members of your family have had with the use of anesthetics. Any blood disorders you have. Previous surgeries you have had. Medical conditions you have. RISKS AND COMPLICATIONS  Generally, this is a safe procedure. However, problems can occur, which may include: Infection. Bleeding. Damage to other organs. Allergic reaction to the anesthetics used during the procedure. BEFORE THE PROCEDURE Do not eat or drink anything after midnight on the night before the procedure or as directed by your health care provider. Ask your health care provider about: Changing or stopping your regular medicines. Taking medicines such as aspirin and ibuprofen. These medicines can thin your blood. Do not take these medicines before your procedure if your health care provider instructs you not to. Plan to have someone take you home after the procedure. PROCEDURE You may be given a medicine to help you relax (sedative). You will be given a medicine to make you sleep (general anesthetic). Your abdomen will be inflated with a gas. This will make your organs easier to see. Small incisions will be  made in your abdomen. A laparoscope and other small instruments will be inserted into the abdomen through the incisions. A tissue sample may be removed from an organ in the abdomen for examination. The instruments will be removed from the abdomen. The gas will be released. The incisions will be closed with stitches (sutures). AFTER THE PROCEDURE  Your blood pressure, heart rate, breathing rate, and blood oxygen level will be monitored often until the medicines you were given have worn off.   This information is not intended to replace advice given to you by your health care provider. Make sure you discuss any questions you have with your health care provider.                                             Bowel Symptoms After Surgery After gynecologic surgery, women often have temporary changes in bowel function (constipation and gas pain).  Following are tips to help prevent and treat common bowel problems.  It also tells you when to call the doctor.  This is important because some symptoms might be a sign of a more serious bowel problem such as obstruction (bowel blockage).  These problems are rare but can happen after gynecologic surgery.   Besides surgery, what can temporarily affect bowel function? 1. Dietary changes   2. Decreased physical  activity   3.Antibiotics   4. Pain medication   How can I prevent constipation (three days or more without a stool)? Include fiber in your diet: whole grains, raw or dried fruits & vegetables, prunes, prune/pear juiceDrink at least 8 glasses of liquid (preferably water) every day Avoid: Gas forming foods such as broccoli, beans, peas, salads, cabbage, sweet potatoes Greasy, fatty, or fried foods Activity helps bowel function return to normal, walk around the house at least 3-4 times each day for 15 minutes or longer, if tolerated.  Rocking in a rocking chair is preferable to sitting still. Stool softeners: these are not laxatives, but serve to soften  the stool to avoid straining.  Take 2-4 times a day until normal bowel function returns         Examples: Colace or generic equivalent (Docusate) Bulk laxatives: provide a concentrated source of fiber.  They do not stimulate the bowel.  Take 1-2 times each day until normal bowel function return.              Examples: Citrucel, Metamucil, Fiberal, Fibercon   What can I take for "Gas Pains"? Simethicone (Mylicon, Gas-X, Maalox-Gas, Mylanta-Gas) take 3-4 times a day Maalox Regular - take 3-4 times a day Mylanta Regular - take 3-4 times a day   What can I take if I become constipated? Start with stool softeners and add additional laxatives below as needed to have a bowel movement every 1-2 days  Stool softeners 1-2 tablets, 2 times a day Senokot 1-2 tablets, 1-2 times a day Glycerin suppository can soften hard stool take once a day Bisacodyl suppository once a day  Milk of Magnesia 30 mL 1-2 times a day Fleets or tap water enema    What can I do for nausea?  Limit most solid foods for 24-48 hours Continue eating small frequent amounts of liquids and/or bland soft foods Toast, crackers, cooked cereal (grits, cream of wheat, rice) Benadryl: a mild anti-nausea medicine can be obtained without a prescription. May cause drowsiness, especially if taken with narcotic pain medicines Contact provider for prescription nausea medication     What can I do, or take for diarrhea (more than five loose stools per day)? Drink plenty of clear fluids to prevent dehydration May take Kaopectate, Pepto-Bismol, Imodium, or probiotics for 1-2 days Anusol or Preparation-H can be helpful for hemorrhoids and irritated tissue around anus   When should I call the doctor?             CONSTIPATION:  Not relieved after three days following the above program VOMITING: That contains blood, "coffee ground" material More the three times/hour and unable to keep down nausea medication for more than eight hours With dry  mouth, dark or strong urine, feeling light-headed, dizzy, or confused With severe abdominal pain or bloating for more than 24 hours DIARRHEA: That continues for more then 24-48 hours despite treatment That contains blood or tarry material With dry mouth, dark or strong urine, feeling light~headed, dizzy, or confused FEVER: 101 F or higher along with nausea, vomiting, gas pain, diarrhea UNABLE TO: Pass gas from rectum for more than 24 hours Tolerate liquids by mouth for more than 24 hours        Laparoscopic Hysterectomy, Care After Refer to this sheet in the next few weeks. These instructions provide you with information on caring for yourself after your procedure. Your health care provider may also give you more specific instructions. Your treatment has been planned according to  current medical practices, but problems sometimes occur. Call your health care provider if you have any problems or questions after your procedure. What can I expect after the procedure? Pain and bruising at the incision sites. You will be given pain medicine to control it. Menopausal symptoms such as hot flashes, night sweats, and insomnia if your ovaries were removed. Sore throat from the breathing tube that was inserted during surgery. Follow these instructions at home: Only take over-the-counter or prescription medicines for pain, discomfort, or fever as directed by your health care provider. Do not take aspirin. It can cause bleeding. Do not drive when taking pain medicine. Follow your health care provider's advice regarding diet, exercise, lifting, driving, and general activities. Resume your usual diet as directed and allowed. Get plenty of rest and sleep. Do not douche, use tampons, or have sexual intercourse for at least 6 weeks, or until your health care provider gives you permission. Change your bandages (dressings) as directed by your health care provider. Monitor your temperature and notify your  health care provider of a fever. Take showers instead of baths for 2-3 weeks. Do not drink alcohol until your health care provider gives you permission. If you develop constipation, you may take a mild laxative with your health care provider's permission. Bran foods may help with constipation problems. Drinking enough fluids to keep your urine clear or pale yellow may help as well. Try to have someone home with you for 1-2 weeks to help around the house. Keep all of your follow-up appointments as directed by your health care provider. Contact a health care provider if: You have swelling, redness, or increasing pain around your incision sites. You have pus coming from your incision. You notice a bad smell coming from your incision. Your incision breaks open. You feel dizzy or lightheaded. You have pain or bleeding when you urinate. You have persistent diarrhea. You have persistent nausea and vomiting. You have abnormal vaginal discharge. You have a rash. You have any type of abnormal reaction or develop an allergy to your medicine. You have poor pain control with your prescribed medicine. Get help right away if: You have chest pain or shortness of breath. You have severe abdominal pain that is not relieved with pain medicine. You have pain or swelling in your legs. This information is not intended to replace advice given to you by your health care provider. Make sure you discuss any questions you have with your health care provider. Document Released: 08/30/2013 Document Revised: 04/16/2016 Document Reviewed: 05/30/2013 [employer]vier Interactive Patient Education  2017 [employer].

## 2023-07-07 NOTE — Progress Notes (Signed)
Met with patient to review pre-operative teaching for planned surgery scheduled for August 04, 2023. Pre-admit phone screen appointment discussed. Teaching included but not limited to labs, bowel prep and dietary restrictions, surgical location, pain scales/management, diligent peri care, guidelines to prevent constipation, and importance of early ambulation post operatively and follow-up care. Written instructions for pre-op and post-op care, contact information for questions and concerns provided to patient. Patient able to repeat instructions to nursing staff. No further questions at this time. Patient agrees with plan to proceed with scheduled procedure.

## 2023-07-07 NOTE — H&P (View-Only) (Signed)
 Gynecologic Oncology Consult Visit   Referring Provider: Dr. Jacobo  Chief Complaint: Left Ovarian Mass  Subjective:  Cassidy Norris is a 72 y.o. female who is seen in consultation from Dr. Jacobo for incidental PET positive left ovarian mass.   Patient returns today for pre op evaluation for BSO due to suspicion of androgen producing tumor in left ovary. No new complaints.    Gyn history Patient diagnosed with recurrent metastatic breast cancer, metastatic to axillary LN, left supraclavicular LN, hx of bilateral mastectomy and chemo, currently on Kisqali , a CDK inhibitor, and fulvestrant , who on restaging PET on 05/03/23 she was found to have: left ovary, mildly prominent measuring 2.8 x 2.7 with maximum SUV of 6.6. Size is similar to previous imaging but SUV was previously noted to be 3.6. No other abdominal lymphadenopathy.   Pelvic us  was performed on 05/26/23 which showed:  - Uterus: Measurements: 5.8 x 3.2 x 4.7 centimeters = volume: 44.6 mL. The uterus is anteverted. Myometrium is heterogeneous. A posterior uterine fibroid is 1.7 x 1.2 x 1.4 centimeters. Nabothian cyst is present in the cervix. - Endometrium: Thickness: 2.9 millimeters.  No focal abnormality visualized. - Right ovary: Measurements: 2.3 x 1.6 x 1.3 centimeters = volume: 2.4 mL. Normal appearance/no adnexal mass. RIGHT ovary is only seen on transabdominal imaging - Left ovary: Measurements: 3.5 x 2.4 x 3.4 centimeters = volume: 15.0 mL. A heterogeneous hypoechoic mass in the central portion of the LEFT ovary is 2.7 x 2.1 x 2.7 centimeters and correlates well with the recent PET-CT. Internal blood flow is identified within this portion of the ovary. - Other findings: Trace amount free pelvic fluid  CA 125  06/10/23 8.3  CA 27.29 06/10/23 96.1 (elevated)  She has previously seen Dr Leonce and underwent D&C hysteroscopy, and endometrial polypectomy for PMB and endometrial polyp.   07/12/2018- DIAGNOSIS:  A.  ENDOMETRIAL CURETTINGS:  - STRIPS OF BENIGN ENDOCERVICAL GLANDULAR EPITHELIUM WITH FOCAL ACUTE INFLAMMATION AND SQUAMOUS METAPLASIA.  - STRIPS OF ATROPHIC ENDOMETRIAL GLANDULAR EPITHELIUM.  - BLOOD.  - FOCUS OF NECROTIC DEBRIS.  - NEGATIVE FOR ATYPIA AND MALIGNANCY.   B.  ENDOMETRIAL POLYP:  - ENDOMETRIAL POLYP.  - NEGATIVE FOR ATYPIA AND MALIGNANCY.   09/2017- Pap - NILM  States she had a pap a few years ago that   More recently she established care with Dr Fredirick at [facility] due to noticing bumps on her vagina that had enlarged. On exam, they were thought to be small inclusions on labia minora and observation was recommended.  was normal.   She has had excessive face hair and shaves since being diagnosed with MS about 20 years ago.  Says some increased with chemo in 2015 and some permanent alopecia.  Some deepening of voice.     Problem List: Patient Active Problem List   Diagnosis Date Noted   Ovarian mass, left 06/16/2023   Total knee replacement status 03/06/2022   Recurrent breast cancer (HCC) 12/30/2021   Erosive esophagitis    Polyp of descending colon    Centrilobular emphysema (HCC) 10/27/2021   S/P total knee arthroplasty, left 08/24/2021   Aortic atherosclerosis (HCC) 07/08/2020   Elevated LFTs 01/04/2020   Drug-induced myopathy 04/21/2019   Statin intolerance 07/22/2018   Multiple sclerosis (HCC) 07/22/2018   Endometrial polyp 06/27/2018   Postmenopausal bleeding 06/27/2018   Gastroesophageal reflux disease 01/20/2018   History of non anemic vitamin B12 deficiency 10/22/2017   Former smoker 09/24/2017   History  of bilateral breast cancer 09/24/2017   Essential hypertension, benign 09/24/2017   Hyperlipidemia associated with type 2 diabetes mellitus (HCC) 07/13/2017   Microalbuminuria 01/12/2017   Type 2 diabetes mellitus with other specified complication (HCC) 09/02/2016   Osteoarthritis of knee 11/22/2015   Personal history of colonic  polyps 10/10/2015   Family history of colon cancer 10/10/2015   Elevated fasting blood sugar 09/23/2015   Restless leg 07/22/2015   Insomnia 07/22/2015   Peripheral neuropathy due to chemotherapy (HCC) 03/19/2015   Lumbar radiculopathy 03/19/2015   Family history of cancer of digestive system     Past Medical History: Past Medical History:  Diagnosis Date   Anemia    Arthritis not really diagnosis but have pain   B12 deficiency 07/23/2016   Will check levels to determine if injections are needed again. Await results. Recent CBC at Onc [facility]   Blood transfusion without reported diagnosis 1983 had a miscarriage/dnc   Cancer (HCC) 12/30/2013   Multifocal disease: pT1c,m, N0(isolated tumor cells) Er/ PR positive, Her 2 negative.   Cancer (HCC) 01/09/2014   Invasive lobular carcinoma, 2.2 cm, T2,N1 ER/PR positive, HER-2/neu negative   Cataract 2014?   Centrilobular emphysema (HCC) 10/27/2021   Complication of anesthesia 03/06/2022   Prolonged metabolism of Rocuronium  during case.   Diabetes mellitus without complication (HCC)    Diffuse cystic mastopathy    Erosive esophagitis    Essential hypertension, benign 09/24/2017   Family history of malignant neoplasm of gastrointestinal tract 2012   Fractured elbow 08/28/2014   Gastroesophageal reflux disease    Hx of metabolic acidosis with increased anion gap    Increased heart rate    Multiple sclerosis (HCC)    Neuromuscular disorder (HCC)    neuropathy in bil feet - left is worse.   Obesity, unspecified    Peripheral neuropathy    Personal history of tobacco use, presenting hazards to health    Restless leg syndrome    Sleep apnea    uses CPAP   Special screening for malignant neoplasms, colon    Squamous cell carcinoma of skin 08/24/2022   SCCIS - scalp, ED&C    Past Surgical History: Past Surgical History:  Procedure Laterality Date   BREAST BIOPSY Right 1973,2001   BREAST BIOPSY Right 11/07/2013   INVASIVE MAMMARY  CARCINOMA , ER/PR positive Her2 negative   BREAST BIOPSY Left 11/27/2013   invasive lobular and DCIS   BREAST SURGERY Bilateral 01/09/2014   mastectomy   cataract surgery Left 08/13/2022   CHOLECYSTECTOMY     COLONOSCOPY  2010   Dr. Dellie   COLONOSCOPY WITH PROPOFOL  N/A 06/11/2015   Procedure: COLONOSCOPY WITH PROPOFOL ;  Surgeon: Louanne KANDICE Dellie, MD;  Location: [facility] ENDOSCOPY;  Service: Endoscopy;  Laterality: N/A;   COLONOSCOPY WITH PROPOFOL  N/A 12/23/2021   Procedure: COLONOSCOPY WITH PROPOFOL ;  Surgeon: Unk Corinn Skiff, MD;  Location: [facility] ENDOSCOPY;  Service: Gastroenterology;  Laterality: N/A;   DILATATION & CURETTAGE/HYSTEROSCOPY WITH MYOSURE N/A 07/12/2018   Procedure: DILATATION & CURETTAGE/HYSTEROSCOPY WITH MYOSURE WITH ENDOMETRIAL POLYPECTOMY;  Surgeon: Leonce Garnette BIRCH, MD;  Location: [facility] ORS;  Service: Gynecology;  Laterality: N/A;   DILATION AND CURETTAGE OF UTERUS  1983   ESOPHAGOGASTRODUODENOSCOPY N/A 12/23/2021   Procedure: ESOPHAGOGASTRODUODENOSCOPY (EGD);  Surgeon: Unk Corinn Skiff, MD;  Location: [facility] ENDOSCOPY;  Service: Gastroenterology;  Laterality: N/A;   JOINT REPLACEMENT  11/23/2015   KNEE ARTHROPLASTY Right 03/06/2022   Procedure: COMPUTER ASSISTED TOTAL KNEE ARTHROPLASTY;  Surgeon: Mardee Lynwood SQUIBB, MD;  Location: [facility] ORS;  Service: Orthopedics;  Laterality: Right;   POLYPECTOMY  1989   PORTA CATH REMOVAL     PORTACATH PLACEMENT  2015   SKIN BIOPSY     on scalp   SPINE SURGERY  09/14/2014   Ruptured disk L4- L5   TOTAL KNEE ARTHROPLASTY Left 11/22/2015   Procedure: TOTAL KNEE ARTHROPLASTY;  Surgeon: Toribio Silos, MD;  [facility];  Service: Orthopedics;  Laterality: Left;   TUBAL LIGATION     WISDOM TOOTH EXTRACTION  2006    OB History:  OB History  Gravida Para Term Preterm AB Living  1       1    SAB IAB Ectopic Multiple Live Births  1            # Outcome Date GA Lbr Len/2nd Weight Sex Type Anes PTL Lv  1 SAB              Obstetric Comments  Age with first menstruation-13  Age with first pregnancy-31    Family History: Family History  Problem Relation Age of Onset   Cancer Mother        lung   COPD Mother    Cancer Father        colon, stomach   Diabetes Maternal Grandmother    Hypertension Sister    Rectal cancer Paternal Uncle    Rectal cancer Paternal Uncle     Social History: Social History   Socioeconomic History   Marital status: Widowed    Spouse name: Not on file   Number of children: Not on file   Years of education: some college   Highest education level: High school graduate  Occupational History   Occupation: retired  Tobacco Use   Smoking status: Former    Current packs/day: 0.00    Average packs/day: 1 pack/day for 30.0 years (30.0 ttl pk-yrs)    Types: Cigarettes    Start date: 12/12/1983    Quit date: 12/11/2013    Years since quitting: 9.5   Smokeless tobacco: Former  Building services engineer status: Never Used  Substance and Sexual Activity   Alcohol use: Not Currently   Drug use: No   Sexual activity: Not Currently    Birth control/protection: Post-menopausal  Other Topics Concern   Not on file  Social History Narrative   Right handed   Drinks caffeine   One story home   Social Determinants of Health   Financial Resource Strain: Low Risk  (05/09/2019)   Overall Financial Resource Strain (CARDIA)    Difficulty of Paying Living Expenses: Not hard at all  Food Insecurity: No Food Insecurity (05/09/2019)   Hunger Vital Sign    Worried About Running Out of Food in the Last Year: Never true    Ran Out of Food in the Last Year: Never true  Transportation Needs: No Transportation Needs (05/09/2019)   PRAPARE - Administrator, Civil Service (Medical): No    Lack of Transportation (Non-Medical): No  Physical Activity: Inactive (05/09/2019)   Exercise Vital Sign    Days of Exercise per Week: 0 days    Minutes of Exercise per Session: 0 min  Stress: Not on  file  Social Connections: Not on file  Intimate Partner Violence: Not on file    Allergies: No Known Allergies  Current Medications: Current Outpatient Medications  Medication Sig Dispense Refill   acetaminophen  (TYLENOL ) 500 MG tablet Take 1,000 mg by mouth 2 (two)  times daily as needed for moderate pain or headache.     baclofen  (LIORESAL ) 10 MG tablet Take 1 tablet (10 mg total) by mouth 3 (three) times daily as needed for muscle spasms. 1080 tablet 0   calcium  carbonate (OSCAL) 1500 (600 Ca) MG TABS tablet Take by mouth 2 (two) times daily with a meal.     diclofenac  Sodium (VOLTAREN ) 1 % GEL Apply 2 g topically 3 (three) times daily as needed. 100 g 2   fluticasone  (FLONASE ) 50 MCG/ACT nasal spray PLACE 2 SPRAYS INTO BOTH NOSTRILS AT BEDTIME. 48 g 1   fulvestrant  (FASLODEX ) 250 MG/5ML injection Inject 500 mg into the muscle every 30 (thirty) days. One injection each buttock over 1-2 minutes. Warm prior to use.     Krill Oil 1000 MG CAPS Take 1,000 mg by mouth daily.     lisinopril  (ZESTRIL ) 5 MG tablet Take 1 tablet (5 mg total) by mouth daily. 90 tablet 3   loperamide  (IMODIUM  A-D) 2 MG tablet Take 2 tablets (4 mg total) by mouth 4 (four) times daily as needed for diarrhea or loose stools. 30 tablet 0   metFORMIN  (GLUCOPHAGE ) 500 MG tablet Take 1 tablet (500 mg total) by mouth 2 (two) times daily with a meal. 180 tablet 3   mometasone  (ELOCON ) 0.1 % cream Apply to affected skin qd-bid prn flares 45 g 2   omeprazole  (PRILOSEC) 40 MG capsule Take 1 capsule (40 mg total) by mouth 2 (two) times daily. 60 capsule 2   ondansetron  (ZOFRAN ) 8 MG tablet Take 1 tablet (8 mg total) by mouth every 8 (eight) hours as needed for nausea or vomiting. 20 tablet 0   prochlorperazine  (COMPAZINE ) 10 MG tablet Take 1 tablet (10 mg total) by mouth every 6 (six) hours as needed for nausea or vomiting. 20 tablet 1   ribociclib  succ (KISQALI , 600 MG DOSE,) 200 MG Therapy Pack Take 3 tablets (600 mg total) by  mouth daily. Take for 21 days on, 7 days off, repeat every 28 days. 63 tablet 5   sodium chloride  (OCEAN) 0.65 % SOLN nasal spray Place 1 spray into both nostrils as needed for congestion.     traZODone  (DESYREL ) 50 MG tablet Take 1 tablet (50 mg total) by mouth at bedtime as needed. for sleep 90 tablet 3   venlafaxine  XR (EFFEXOR -XR) 75 MG 24 hr capsule Take 1 capsule (75 mg total) by mouth daily with breakfast. 360 capsule 0   vitamin B-12 (CYANOCOBALAMIN ) 1000 MCG tablet Take 2,500 mcg by mouth every Monday, Wednesday, and Friday.     Vitamin D , Cholecalciferol , 50 MCG (2000 UT) CAPS Take 2,000 Units by mouth every Monday, Wednesday, and Friday.     No current facility-administered medications for this visit.    Review of Systems General: positive for weight gain, generalized weakness Skin: negative for changes in color, texture, moles or lesions Eyes: negative for, changes in vision, pain, diplopia HEENT: negative for, change in hearing, pain, discharge, vertigo, voice changes, sore throat, neck masses. Positive for tinnitus and vision changes.  Pulmonary: negative for, dyspnea, orthopnea, productive cough Cardiac: negative for, palpitations, syncope, pain, discomfort, pressure. Positive for claudication Gastrointestinal: positive for nausea, vomiting, diarrhea, black stools Genitourinary/Sexual: positive for incomplete emptying of bladder Musculoskeletal: positive for pain of legs, joints, and back Hematology: positive for easy bruising Neurologic/Psych:positive for numbness  Objective:  Physical Examination:  BP 109/69   Pulse (!) 119   Temp 97.6 F (36.4 C)   Resp 19  Wt 183 lb 6.4 oz (83.2 kg)   LMP 12/22/1999 (Approximate)   SpO2 99%   BMI 30.52 kg/m     ECOG Performance Status: 1 - Symptomatic but completely ambulatory  General appearance: no acute distress. Alopecia. Facial hair HEENT: sclera clear Neck: no thyroid  enlargement or cervical adenopathy Lymph node  survey: non-palpable, axillary, inguinal, supraclavicular Cardiovascular: without murmurs, rubs or gallops Respiratory: clear to auscultation Abdomen: without hepatosplenomegaly Back: inspection of back is normal Extremities: no lower extremity edema Skin exam - normal coloration and turgor, no rashes, no suspicious skin lesions noted. Neurological exam reveals alert, oriented, normal speech, no focal findings or movement disorder noted.  Pelvic: Exam chaperoned by CMA EGBUS: sebaceous cysts on left labia, not infected Cervix: no lesions, nontender, mobile Vagina: no lesions, no discharge or bleeding Uterus: normal size, nontender, mobile Adnexa: no palpable masses Rectovaginal: confirmatory   Lab Results  Component Value Date   WBC 4.1 06/10/2023   HGB 9.9 (L) 06/10/2023   HCT 28.4 (L) 06/10/2023   MCV 106.8 (H) 06/10/2023   PLT 206 06/10/2023     Chemistry      Component Value Date/Time   NA 135 06/10/2023 0946   NA 142 07/14/2019 1516   NA 139 03/11/2015 1356   K 3.5 06/10/2023 0946   K 3.4 (L) 03/11/2015 1356   CL 101 06/10/2023 0946   CL 103 03/11/2015 1356   CO2 21 (L) 06/10/2023 0946   CO2 28 03/11/2015 1356   BUN 28 (H) 06/10/2023 0946   BUN 12 07/14/2019 1516   BUN 13 03/11/2015 1356   CREATININE 1.85 (H) 06/10/2023 0946   CREATININE 0.85 10/20/2021 0838      Component Value Date/Time   CALCIUM  9.1 06/10/2023 0946   CALCIUM  9.2 03/11/2015 1356   ALKPHOS 68 06/10/2023 0946   ALKPHOS 68 03/11/2015 1356   AST 32 06/10/2023 0946   ALT 19 06/10/2023 0946   ALT 32 03/11/2015 1356   BILITOT 0.4 06/10/2023 0946     Radiographic Imaging:  Imaging reviewed per HPI, Assessment, and Plan     Assessment:  Cassidy Norris is a 72 y.o. female diagnosed with metastatic breast cancer and persistent small solid/heterogenous left ovarian mass, 3 cm.  She is taking cdk inhibitor and fulvestrant  (since 2/23) for metastatic breast cancer, and this is in good control  with response of supraclavicular nodal disease on serial PET scans.  She was referred because of concern that the SUV of the left ovarian mass has increased from 3.6 last year to 6.6 recently.  However, I reviewed these scans with the radiologist and he is not certain there has actually been a change.  The two PET scans were done with different machines.  Pelvic US  did confirm the presence of a small mass 2.7 cm in the left ovary.  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  06/16/23 Testosterone  highly elevated to 239, free T elevated 0.8%. AFP, inhibin A/B, CA125 normal.  Discussed with Dr Marcey Salt of [facility] and he does not think the elevated testosterone  is due to Fulvestrant .  She has a small left ovarian mass and the symptoms of virilization antedate when she started fulvestrant . So recommend surgical removal of the ovarian tumor.   History of PMB 2019 with polyp on D&C.  No PMB now.  Normal PAPs.  History of  tubal ligation.   Vulvar sebaceous cysts.  Medical co-morbidities complicating care: breast cancer CKD, type 2 diabetes, MS, GERD, aortic atherosclerosis, emphysema and others.  Plan:   Problem List Items Addressed This Visit       Other   Ovarian mass, left - Primary    We discussed options for management including removal of the ovaries and tubes in view of virilization and elevated testosterone . Scheduled for LS BSO, possible staging on 08/04/23.  Discussed with Dr Jacobo and he advised stopping the cdk inhibitor 2 weeks pre op.    Gyn Onc Surgical Risk The risks of surgery were discussed in detail and she understands the using to include infection, wound separation, hernia, vaginal cuff separation, injury to adjacent organs such as bowel, bladder, blood vessels, ureters, and nerves, bleeding which may require blood transfusion, anesthesia  risk (thromboembolic events, possible death), unforeseen complications, possible need for reexploration, medical complications such as heart attack, stroke, pneumonia, and if staging performed the risk of lymphedema and lymphocyst.  The patient will receive DVT and antibiotic prophylaxis is indicated.  She voiced a clear understanding.  She had the opportunity to ask questions and electronic and paper informed consent was obtained today.  Discussed that sebaceous cysts of vulva could be treated with sitz baths.    The patient's diagnosis, an outline of the further diagnostic and laboratory studies which will be required, the recommendation for surgery, and alternatives were discussed with her and her accompanying family members.  All questions were answered to their satisfaction.  Prentice Agent, MD

## 2023-07-07 NOTE — Progress Notes (Signed)
Gynecologic Oncology Consult Visit   Referring Provider: Dr. Orlie Dakin  Chief Complaint: Left Ovarian Mass  Subjective:  Cassidy Norris is a 72 y.o. female who is seen in consultation from Dr. Orlie Dakin for incidental PET positive left ovarian mass.   Patient returns today for pre op evaluation for BSO due to suspicion of androgen producing tumor in left ovary. No new complaints.    Gyn history Patient diagnosed with recurrent metastatic breast cancer, metastatic to axillary LN, left supraclavicular LN, hx of bilateral mastectomy and chemo, currently on Kisqali, a CDK inhibitor, and fulvestrant, who on restaging PET on 05/03/23 she was found to have: left ovary, mildly prominent measuring 2.8 x 2.7 with maximum SUV of 6.6. Size is similar to previous imaging but SUV was previously noted to be 3.6. No other abdominal lymphadenopathy.   Pelvic US was performed on 05/26/23 which showed:  - Uterus: Measurements: 5.8 x 3.2 x 4.7 centimeters = volume: 44.6 mL. The uterus is anteverted. Myometrium is heterogeneous. A posterior uterine fibroid is 1.7 x 1.2 x 1.4 centimeters. Nabothian cyst is present in the cervix. - Endometrium: Thickness: 2.9 millimeters.  No focal abnormality visualized. - Right ovary: Measurements: 2.3 x 1.6 x 1.3 centimeters = volume: 2.4 mL. Normal appearance/no adnexal mass. RIGHT ovary is only seen on transabdominal imaging - Left ovary: Measurements: 3.5 x 2.4 x 3.4 centimeters = volume: 15.0 mL. A heterogeneous hypoechoic mass in the central portion of the LEFT ovary is 2.7 x 2.1 x 2.7 centimeters and correlates well with the recent PET-CT. Internal blood flow is identified within this portion of the ovary. - Other findings: Trace amount free pelvic fluid  CA 125  06/10/23 8.3  CA 27.29 06/10/23 96.1 (elevated)  She has previously seen Dr Jean Rosenthal and underwent D&C hysteroscopy, and endometrial polypectomy for PMB and endometrial polyp.   07/12/2018- DIAGNOSIS:  A.  ENDOMETRIAL CURETTINGS:  - STRIPS OF BENIGN ENDOCERVICAL GLANDULAR EPITHELIUM WITH FOCAL ACUTE INFLAMMATION AND SQUAMOUS METAPLASIA.  - STRIPS OF ATROPHIC ENDOMETRIAL GLANDULAR EPITHELIUM.  - BLOOD.  - FOCUS OF NECROTIC DEBRIS.  - NEGATIVE FOR ATYPIA AND MALIGNANCY.   B.  ENDOMETRIAL POLYP:  - ENDOMETRIAL POLYP.  - NEGATIVE FOR ATYPIA AND MALIGNANCY.   09/2017- Pap - NILM  States she had a pap a few years ago that   More recently she established care with Dr Shawnie Pons at [facility] due to noticing bumps on her vagina that had enlarged. On exam, they were thought to be small inclusions on labia minora and observation was recommended.  was normal.   She has had excessive face hair and shaves since being diagnosed with MS about 20 years ago.  Says some increased with chemo in 2015 and some permanent alopecia.  Some deepening of voice.     Problem List: Patient Active Problem List   Diagnosis Date Noted   Ovarian mass, left 06/16/2023   Total knee replacement status 03/06/2022   Recurrent breast cancer (HCC) 12/30/2021   Erosive esophagitis    Polyp of descending colon    Centrilobular emphysema (HCC) 10/27/2021   S/P total knee arthroplasty, left 08/24/2021   Aortic atherosclerosis (HCC) 07/08/2020   Elevated LFTs 01/04/2020   Drug-induced myopathy 04/21/2019   Statin intolerance 07/22/2018   Multiple sclerosis (HCC) 07/22/2018   Endometrial polyp 06/27/2018   Postmenopausal bleeding 06/27/2018   Gastroesophageal reflux disease 01/20/2018   History of non anemic vitamin B12 deficiency 10/22/2017   Former smoker 09/24/2017   History  of bilateral breast cancer 09/24/2017   Essential hypertension, benign 09/24/2017   Hyperlipidemia associated with type 2 diabetes mellitus (HCC) 07/13/2017   Microalbuminuria 01/12/2017   Type 2 diabetes mellitus with other specified complication (HCC) 09/02/2016   Osteoarthritis of knee 11/22/2015   Personal history of colonic  polyps 10/10/2015   Family history of colon cancer 10/10/2015   Elevated fasting blood sugar 09/23/2015   Restless leg 07/22/2015   Insomnia 07/22/2015   Peripheral neuropathy due to chemotherapy (HCC) 03/19/2015   Lumbar radiculopathy 03/19/2015   Family history of cancer of digestive system     Past Medical History: Past Medical History:  Diagnosis Date   Anemia    Arthritis not really diagnosis but have pain   B12 deficiency 07/23/2016   Will check levels to determine if injections are needed again. Await results. Recent CBC at Onc [facility]   Blood transfusion without reported diagnosis 1983 had a miscarriage/dnc   Cancer (HCC) 12/30/2013   Multifocal disease: pT1c,m, N0(isolated tumor cells) Er/ PR positive, Her 2 negative.   Cancer (HCC) 01/09/2014   Invasive lobular carcinoma, 2.2 cm, T2,N1 ER/PR positive, HER-2/neu negative   Cataract 2014?   Centrilobular emphysema (HCC) 10/27/2021   Complication of anesthesia 03/06/2022   Prolonged metabolism of Rocuronium during case.   Diabetes mellitus without complication (HCC)    Diffuse cystic mastopathy    Erosive esophagitis    Essential hypertension, benign 09/24/2017   Family history of malignant neoplasm of gastrointestinal tract 2012   Fractured elbow 08/28/2014   Gastroesophageal reflux disease    Hx of metabolic acidosis with increased anion gap    Increased heart rate    Multiple sclerosis (HCC)    Neuromuscular disorder (HCC)    neuropathy in bil feet - left is worse.   Obesity, unspecified    Peripheral neuropathy    Personal history of tobacco use, presenting hazards to health    Restless leg syndrome    Sleep apnea    uses CPAP   Special screening for malignant neoplasms, colon    Squamous cell carcinoma of skin 08/24/2022   SCCIS - scalp, ED&C    Past Surgical History: Past Surgical History:  Procedure Laterality Date   BREAST BIOPSY Right 1973,2001   BREAST BIOPSY Right 11/07/2013   INVASIVE MAMMARY  CARCINOMA , ER/PR positive Her2 negative   BREAST BIOPSY Left 11/27/2013   invasive lobular and DCIS   BREAST SURGERY Bilateral 01/09/2014   mastectomy   cataract surgery Left 08/13/2022   CHOLECYSTECTOMY     COLONOSCOPY  2010   Dr. Evette Cristal   COLONOSCOPY WITH PROPOFOL N/A 06/11/2015   Procedure: COLONOSCOPY WITH PROPOFOL;  Surgeon: Kieth Brightly, MD;  Location: [facility] ENDOSCOPY;  Service: Endoscopy;  Laterality: N/A;   COLONOSCOPY WITH PROPOFOL N/A 12/23/2021   Procedure: COLONOSCOPY WITH PROPOFOL;  Surgeon: Toney Reil, MD;  Location: [facility] ENDOSCOPY;  Service: Gastroenterology;  Laterality: N/A;   DILATATION & CURETTAGE/HYSTEROSCOPY WITH MYOSURE N/A 07/12/2018   Procedure: DILATATION & CURETTAGE/HYSTEROSCOPY WITH MYOSURE WITH ENDOMETRIAL POLYPECTOMY;  Surgeon: Conard Novak, MD;  Location: [facility] ORS;  Service: Gynecology;  Laterality: N/A;   DILATION AND CURETTAGE OF UTERUS  1983   ESOPHAGOGASTRODUODENOSCOPY N/A 12/23/2021   Procedure: ESOPHAGOGASTRODUODENOSCOPY (EGD);  Surgeon: Toney Reil, MD;  Location: [facility] ENDOSCOPY;  Service: Gastroenterology;  Laterality: N/A;   JOINT REPLACEMENT  11/23/2015   KNEE ARTHROPLASTY Right 03/06/2022   Procedure: COMPUTER ASSISTED TOTAL KNEE ARTHROPLASTY;  Surgeon: Donato Heinz, MD;  Location: [facility] ORS;  Service: Orthopedics;  Laterality: Right;   POLYPECTOMY  1989   PORTA CATH REMOVAL     PORTACATH PLACEMENT  2015   SKIN BIOPSY     on scalp   SPINE SURGERY  09/14/2014   Ruptured disk L4- L5   TOTAL KNEE ARTHROPLASTY Left 11/22/2015   Procedure: TOTAL KNEE ARTHROPLASTY;  Surgeon: Frederico Hamman, MD;  Location: [facility] OR;  Service: Orthopedics;  Laterality: Left;   TUBAL LIGATION     WISDOM TOOTH EXTRACTION  2006    OB History:  OB History  Gravida Para Term Preterm AB Living  1       1    SAB IAB Ectopic Multiple Live Births  1            # Outcome Date GA Lbr Len/2nd Weight Sex Type Anes PTL Lv  1 SAB              Obstetric Comments  Age with first menstruation-13  Age with first pregnancy-31    Family History: Family History  Problem Relation Age of Onset   Cancer Mother        lung   COPD Mother    Cancer Father        colon, stomach   Diabetes Maternal Grandmother    Hypertension Sister    Rectal cancer Paternal Uncle    Rectal cancer Paternal Uncle     Social History: Social History   Socioeconomic History   Marital status: Widowed    Spouse name: Not on file   Number of children: Not on file   Years of education: some college   Highest education level: High school graduate  Occupational History   Occupation: retired  Tobacco Use   Smoking status: Former    Current packs/day: 0.00    Average packs/day: 1 pack/day for 30.0 years (30.0 ttl pk-yrs)    Types: Cigarettes    Start date: 12/12/1983    Quit date: 12/11/2013    Years since quitting: 9.5   Smokeless tobacco: Former  Building services engineer status: Never Used  Substance and Sexual Activity   Alcohol use: Not Currently   Drug use: No   Sexual activity: Not Currently    Birth control/protection: Post-menopausal  Other Topics Concern   Not on file  Social History Narrative   Right handed   Drinks caffeine   One story home   Social Determinants of Health   Financial Resource Strain: Low Risk  (05/09/2019)   Overall Financial Resource Strain (CARDIA)    Difficulty of Paying Living Expenses: Not hard at all  Food Insecurity: No Food Insecurity (05/09/2019)   Hunger Vital Sign    Worried About Running Out of Food in the Last Year: Never true    Ran Out of Food in the Last Year: Never true  Transportation Needs: No Transportation Needs (05/09/2019)   PRAPARE - Administrator, Civil Service (Medical): No    Lack of Transportation (Non-Medical): No  Physical Activity: Inactive (05/09/2019)   Exercise Vital Sign    Days of Exercise per Week: 0 days    Minutes of Exercise per Session: 0 min  Stress: Not on  file  Social Connections: Not on file  Intimate Partner Violence: Not on file    Allergies: No Known Allergies  Current Medications: Current Outpatient Medications  Medication Sig Dispense Refill   acetaminophen (TYLENOL) 500 MG tablet Take 1,000 mg by mouth 2 (two)  times daily as needed for moderate pain or headache.     baclofen (LIORESAL) 10 MG tablet Take 1 tablet (10 mg total) by mouth 3 (three) times daily as needed for muscle spasms. 1080 tablet 0   calcium carbonate (OSCAL) 1500 (600 Ca) MG TABS tablet Take by mouth 2 (two) times daily with a meal.     diclofenac Sodium (VOLTAREN) 1 % GEL Apply 2 g topically 3 (three) times daily as needed. 100 g 2   fluticasone (FLONASE) 50 MCG/ACT nasal spray PLACE 2 SPRAYS INTO BOTH NOSTRILS AT BEDTIME. 48 g 1   fulvestrant (FASLODEX) 250 MG/5ML injection Inject 500 mg into the muscle every 30 (thirty) days. One injection each buttock over 1-2 minutes. Warm prior to use.     Krill Oil 1000 MG CAPS Take 1,000 mg by mouth daily.     lisinopril (ZESTRIL) 5 MG tablet Take 1 tablet (5 mg total) by mouth daily. 90 tablet 3   loperamide (IMODIUM A-D) 2 MG tablet Take 2 tablets (4 mg total) by mouth 4 (four) times daily as needed for diarrhea or loose stools. 30 tablet 0   metFORMIN (GLUCOPHAGE) 500 MG tablet Take 1 tablet (500 mg total) by mouth 2 (two) times daily with a meal. 180 tablet 3   mometasone (ELOCON) 0.1 % cream Apply to affected skin qd-bid prn flares 45 g 2   omeprazole (PRILOSEC) 40 MG capsule Take 1 capsule (40 mg total) by mouth 2 (two) times daily. 60 capsule 2   ondansetron (ZOFRAN) 8 MG tablet Take 1 tablet (8 mg total) by mouth every 8 (eight) hours as needed for nausea or vomiting. 20 tablet 0   prochlorperazine (COMPAZINE) 10 MG tablet Take 1 tablet (10 mg total) by mouth every 6 (six) hours as needed for nausea or vomiting. 20 tablet 1   ribociclib succ (KISQALI, 600 MG DOSE,) 200 MG Therapy Pack Take 3 tablets (600 mg total) by  mouth daily. Take for 21 days on, 7 days off, repeat every 28 days. 63 tablet 5   sodium chloride (OCEAN) 0.65 % SOLN nasal spray Place 1 spray into both nostrils as needed for congestion.     traZODone (DESYREL) 50 MG tablet Take 1 tablet (50 mg total) by mouth at bedtime as needed. for sleep 90 tablet 3   venlafaxine XR (EFFEXOR-XR) 75 MG 24 hr capsule Take 1 capsule (75 mg total) by mouth daily with breakfast. 360 capsule 0   vitamin B-12 (CYANOCOBALAMIN) 1000 MCG tablet Take 2,500 mcg by mouth every Monday, Wednesday, and Friday.     Vitamin D, Cholecalciferol, 50 MCG (2000 UT) CAPS Take 2,000 Units by mouth every Monday, Wednesday, and Friday.     No current facility-administered medications for this visit.    Review of Systems General: positive for weight gain, generalized weakness Skin: negative for changes in color, texture, moles or lesions Eyes: negative for, changes in vision, pain, diplopia HEENT: negative for, change in hearing, pain, discharge, vertigo, voice changes, sore throat, neck masses. Positive for tinnitus and vision changes.  Pulmonary: negative for, dyspnea, orthopnea, productive cough Cardiac: negative for, palpitations, syncope, pain, discomfort, pressure. Positive for claudication Gastrointestinal: positive for nausea, vomiting, diarrhea, black stools Genitourinary/Sexual: positive for incomplete emptying of bladder Musculoskeletal: positive for pain of legs, joints, and back Hematology: positive for easy bruising Neurologic/Psych:positive for numbness  Objective:  Physical Examination:  BP 109/69   Pulse (!) 119   Temp 97.6 F (36.4 C)   Resp 19  Wt 183 lb 6.4 oz (83.2 kg)   LMP 12/22/1999 (Approximate)   SpO2 99%   BMI 30.52 kg/m     ECOG Performance Status: 1 - Symptomatic but completely ambulatory  General appearance: no acute distress. Alopecia. Facial hair HEENT: sclera clear Neck: no thyroid enlargement or cervical adenopathy Lymph node  survey: non-palpable, axillary, inguinal, supraclavicular Cardiovascular: without murmurs, rubs or gallops Respiratory: clear to auscultation Abdomen: without hepatosplenomegaly Back: inspection of back is normal Extremities: no lower extremity edema Skin exam - normal coloration and turgor, no rashes, no suspicious skin lesions noted. Neurological exam reveals alert, oriented, normal speech, no focal findings or movement disorder noted.  Pelvic: Exam chaperoned by CMA EGBUS: sebaceous cysts on left labia, not infected Cervix: no lesions, nontender, mobile Vagina: no lesions, no discharge or bleeding Uterus: normal size, nontender, mobile Adnexa: no palpable masses Rectovaginal: confirmatory   Lab Results  Component Value Date   WBC 4.1 06/10/2023   HGB 9.9 (L) 06/10/2023   HCT 28.4 (L) 06/10/2023   MCV 106.8 (H) 06/10/2023   PLT 206 06/10/2023     Chemistry      Component Value Date/Time   NA 135 06/10/2023 0946   NA 142 07/14/2019 1516   NA 139 03/11/2015 1356   K 3.5 06/10/2023 0946   K 3.4 (L) 03/11/2015 1356   CL 101 06/10/2023 0946   CL 103 03/11/2015 1356   CO2 21 (L) 06/10/2023 0946   CO2 28 03/11/2015 1356   BUN 28 (H) 06/10/2023 0946   BUN 12 07/14/2019 1516   BUN 13 03/11/2015 1356   CREATININE 1.85 (H) 06/10/2023 0946   CREATININE 0.85 10/20/2021 0838      Component Value Date/Time   CALCIUM 9.1 06/10/2023 0946   CALCIUM 9.2 03/11/2015 1356   ALKPHOS 68 06/10/2023 0946   ALKPHOS 68 03/11/2015 1356   AST 32 06/10/2023 0946   ALT 19 06/10/2023 0946   ALT 32 03/11/2015 1356   BILITOT 0.4 06/10/2023 0946     Radiographic Imaging:  Imaging reviewed per HPI, Assessment, and Plan     Assessment:  DALAYSHA MARZILLI is a 72 y.o. female diagnosed with metastatic breast cancer and persistent small solid/heterogenous left ovarian mass, 3 cm.  She is taking cdk inhibitor and fulvestrant (since 2/23) for metastatic breast cancer, and this is in good control  with response of supraclavicular nodal disease on serial PET scans.  She was referred because of concern that the SUV of the left ovarian mass has increased from 3.6 last year to 6.6 recently.  However, I reviewed these scans with the radiologist and he is not certain there has actually been a change.  The two PET scans were done with different machines.  Pelvic US did confirm the presence of a small mass 2.7 cm in the left ovary.  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  06/16/23 Testosterone highly elevated to 239, free T elevated 0.8%. AFP, inhibin A/B, CA125 normal.  Discussed with Dr Doloris Hall of [facility] and he does not think the elevated testosterone is due to Fulvestrant.  She has a small left ovarian mass and the symptoms of virilization antedate when she started fulvestrant. So recommend surgical removal of the ovarian tumor.   History of PMB 2019 with polyp on D&C.  No PMB now.  Normal PAPs.  History of  tubal ligation.   Vulvar sebaceous cysts.  Medical co-morbidities complicating care: breast cancer CKD, type 2 diabetes, MS, GERD, aortic atherosclerosis, emphysema and others.  Plan:   Problem List Items Addressed This Visit       Other   Ovarian mass, left - Primary    We discussed options for management including removal of the ovaries and tubes in view of virilization and elevated testosterone. Scheduled for LS BSO, possible staging on 08/04/23.  Discussed with Dr Orlie Dakin and he advised stopping the cdk inhibitor 2 weeks pre op.    Gyn Onc Surgical Risk The risks of surgery were discussed in detail and she understands the using to include infection, wound separation, hernia, vaginal cuff separation, injury to adjacent organs such as bowel, bladder, blood vessels, ureters, and nerves, bleeding which may require blood transfusion, anesthesia  risk (thromboembolic events, possible death), unforeseen complications, possible need for reexploration, medical complications such as heart attack, stroke, pneumonia, and if staging performed the risk of lymphedema and lymphocyst.  The patient will receive DVT and antibiotic prophylaxis is indicated.  She voiced a clear understanding.  She had the opportunity to ask questions and electronic and paper informed consent was obtained today.  Discussed that sebaceous cysts of vulva could be treated with sitz baths.    The patient's diagnosis, an outline of the further diagnostic and laboratory studies which will be required, the recommendation for surgery, and alternatives were discussed with her and her accompanying family members.  All questions were answered to their satisfaction.  Leida Lauth, MD   CC:  Smitty Cords, DO [address] [place],  [place] [address] [phone]

## 2023-07-07 NOTE — H&P (Signed)
Gynecologic Oncology Consult Visit   Referring Provider: Dr. Orlie Dakin  Chief Complaint: Left Ovarian Mass  Subjective:  Cassidy Norris is a 72 y.o. female who is seen in consultation from Dr. Orlie Dakin for incidental PET positive left ovarian mass.   Patient returns today for pre op evaluation for BSO due to suspicion of androgen producing tumor in left ovary. No new complaints.    Gyn history Patient diagnosed with recurrent metastatic breast cancer, metastatic to axillary LN, left supraclavicular LN, hx of bilateral mastectomy and chemo, currently on Kisqali, a CDK inhibitor, and fulvestrant, who on restaging PET on 05/03/23 she was found to have: left ovary, mildly prominent measuring 2.8 x 2.7 with maximum SUV of 6.6. Size is similar to previous imaging but SUV was previously noted to be 3.6. No other abdominal lymphadenopathy.   Pelvic US was performed on 05/26/23 which showed:  - Uterus: Measurements: 5.8 x 3.2 x 4.7 centimeters = volume: 44.6 mL. The uterus is anteverted. Myometrium is heterogeneous. A posterior uterine fibroid is 1.7 x 1.2 x 1.4 centimeters. Nabothian cyst is present in the cervix. - Endometrium: Thickness: 2.9 millimeters.  No focal abnormality visualized. - Right ovary: Measurements: 2.3 x 1.6 x 1.3 centimeters = volume: 2.4 mL. Normal appearance/no adnexal mass. RIGHT ovary is only seen on transabdominal imaging - Left ovary: Measurements: 3.5 x 2.4 x 3.4 centimeters = volume: 15.0 mL. A heterogeneous hypoechoic mass in the central portion of the LEFT ovary is 2.7 x 2.1 x 2.7 centimeters and correlates well with the recent PET-CT. Internal blood flow is identified within this portion of the ovary. - Other findings: Trace amount free pelvic fluid  CA 125  06/10/23 8.3  CA 27.29 06/10/23 96.1 (elevated)  She has previously seen Dr Jean Rosenthal and underwent D&C hysteroscopy, and endometrial polypectomy for PMB and endometrial polyp.   07/12/2018- DIAGNOSIS:  A.  ENDOMETRIAL CURETTINGS:  - STRIPS OF BENIGN ENDOCERVICAL GLANDULAR EPITHELIUM WITH FOCAL ACUTE INFLAMMATION AND SQUAMOUS METAPLASIA.  - STRIPS OF ATROPHIC ENDOMETRIAL GLANDULAR EPITHELIUM.  - BLOOD.  - FOCUS OF NECROTIC DEBRIS.  - NEGATIVE FOR ATYPIA AND MALIGNANCY.   B.  ENDOMETRIAL POLYP:  - ENDOMETRIAL POLYP.  - NEGATIVE FOR ATYPIA AND MALIGNANCY.   09/2017- Pap - NILM  States she had a pap a few years ago that   More recently she established care with Dr Shawnie Pons at [facility] due to noticing bumps on her vagina that had enlarged. On exam, they were thought to be small inclusions on labia minora and observation was recommended.  was normal.   She has had excessive face hair and shaves since being diagnosed with MS about 20 years ago.  Says some increased with chemo in 2015 and some permanent alopecia.  Some deepening of voice.     Problem List: Patient Active Problem List   Diagnosis Date Noted   Ovarian mass, left 06/16/2023   Total knee replacement status 03/06/2022   Recurrent breast cancer (HCC) 12/30/2021   Erosive esophagitis    Polyp of descending colon    Centrilobular emphysema (HCC) 10/27/2021   S/P total knee arthroplasty, left 08/24/2021   Aortic atherosclerosis (HCC) 07/08/2020   Elevated LFTs 01/04/2020   Drug-induced myopathy 04/21/2019   Statin intolerance 07/22/2018   Multiple sclerosis (HCC) 07/22/2018   Endometrial polyp 06/27/2018   Postmenopausal bleeding 06/27/2018   Gastroesophageal reflux disease 01/20/2018   History of non anemic vitamin B12 deficiency 10/22/2017   Former smoker 09/24/2017   History  of bilateral breast cancer 09/24/2017   Essential hypertension, benign 09/24/2017   Hyperlipidemia associated with type 2 diabetes mellitus (HCC) 07/13/2017   Microalbuminuria 01/12/2017   Type 2 diabetes mellitus with other specified complication (HCC) 09/02/2016   Osteoarthritis of knee 11/22/2015   Personal history of colonic  polyps 10/10/2015   Family history of colon cancer 10/10/2015   Elevated fasting blood sugar 09/23/2015   Restless leg 07/22/2015   Insomnia 07/22/2015   Peripheral neuropathy due to chemotherapy (HCC) 03/19/2015   Lumbar radiculopathy 03/19/2015   Family history of cancer of digestive system     Past Medical History: Past Medical History:  Diagnosis Date   Anemia    Arthritis not really diagnosis but have pain   B12 deficiency 07/23/2016   Will check levels to determine if injections are needed again. Await results. Recent CBC at Onc [facility]   Blood transfusion without reported diagnosis 1983 had a miscarriage/dnc   Cancer (HCC) 12/30/2013   Multifocal disease: pT1c,m, N0(isolated tumor cells) Er/ PR positive, Her 2 negative.   Cancer (HCC) 01/09/2014   Invasive lobular carcinoma, 2.2 cm, T2,N1 ER/PR positive, HER-2/neu negative   Cataract 2014?   Centrilobular emphysema (HCC) 10/27/2021   Complication of anesthesia 03/06/2022   Prolonged metabolism of Rocuronium during case.   Diabetes mellitus without complication (HCC)    Diffuse cystic mastopathy    Erosive esophagitis    Essential hypertension, benign 09/24/2017   Family history of malignant neoplasm of gastrointestinal tract 2012   Fractured elbow 08/28/2014   Gastroesophageal reflux disease    Hx of metabolic acidosis with increased anion gap    Increased heart rate    Multiple sclerosis (HCC)    Neuromuscular disorder (HCC)    neuropathy in bil feet - left is worse.   Obesity, unspecified    Peripheral neuropathy    Personal history of tobacco use, presenting hazards to health    Restless leg syndrome    Sleep apnea    uses CPAP   Special screening for malignant neoplasms, colon    Squamous cell carcinoma of skin 08/24/2022   SCCIS - scalp, ED&C    Past Surgical History: Past Surgical History:  Procedure Laterality Date   BREAST BIOPSY Right 1973,2001   BREAST BIOPSY Right 11/07/2013   INVASIVE MAMMARY  CARCINOMA , ER/PR positive Her2 negative   BREAST BIOPSY Left 11/27/2013   invasive lobular and DCIS   BREAST SURGERY Bilateral 01/09/2014   mastectomy   cataract surgery Left 08/13/2022   CHOLECYSTECTOMY     COLONOSCOPY  2010   Dr. Evette Cristal   COLONOSCOPY WITH PROPOFOL N/A 06/11/2015   Procedure: COLONOSCOPY WITH PROPOFOL;  Surgeon: Kieth Brightly, MD;  Location: [facility] ENDOSCOPY;  Service: Endoscopy;  Laterality: N/A;   COLONOSCOPY WITH PROPOFOL N/A 12/23/2021   Procedure: COLONOSCOPY WITH PROPOFOL;  Surgeon: Toney Reil, MD;  Location: [facility] ENDOSCOPY;  Service: Gastroenterology;  Laterality: N/A;   DILATATION & CURETTAGE/HYSTEROSCOPY WITH MYOSURE N/A 07/12/2018   Procedure: DILATATION & CURETTAGE/HYSTEROSCOPY WITH MYOSURE WITH ENDOMETRIAL POLYPECTOMY;  Surgeon: Conard Novak, MD;  Location: [facility] ORS;  Service: Gynecology;  Laterality: N/A;   DILATION AND CURETTAGE OF UTERUS  1983   ESOPHAGOGASTRODUODENOSCOPY N/A 12/23/2021   Procedure: ESOPHAGOGASTRODUODENOSCOPY (EGD);  Surgeon: Toney Reil, MD;  Location[facility] ENDOSCOPY;  Service: Gastroenterology;  Laterality: N/A;   JOINT REPLACEMENT  11/23/2015   KNEE ARTHROPLASTY Right 03/06/2022   Procedure: COMPUTER ASSISTED TOTAL KNEE ARTHROPLASTY;  Surgeon: Donato Heinz, MD;  Location: [facility] ORS;  Service: Orthopedics;  Laterality: Right;   POLYPECTOMY  1989   PORTA CATH REMOVAL     PORTACATH PLACEMENT  2015   SKIN BIOPSY     on scalp   SPINE SURGERY  09/14/2014   Ruptured disk L4- L5   TOTAL KNEE ARTHROPLASTY Left 11/22/2015   Procedure: TOTAL KNEE ARTHROPLASTY;  Surgeon: Frederico Hamman, MD;  Location: [facility] OR;  Service: Orthopedics;  Laterality: Left;   TUBAL LIGATION     WISDOM TOOTH EXTRACTION  2006    OB History:  OB History  Gravida Para Term Preterm AB Living  1       1    SAB IAB Ectopic Multiple Live Births  1            # Outcome Date GA Lbr Len/2nd Weight Sex Type Anes PTL Lv  1 SAB              Obstetric Comments  Age with first menstruation-13  Age with first pregnancy-31    Family History: Family History  Problem Relation Age of Onset   Cancer Mother        lung   COPD Mother    Cancer Father        colon, stomach   Diabetes Maternal Grandmother    Hypertension Sister    Rectal cancer Paternal Uncle    Rectal cancer Paternal Uncle     Social History: Social History   Socioeconomic History   Marital status: Widowed    Spouse name: Not on file   Number of children: Not on file   Years of education: some college   Highest education level: High school graduate  Occupational History   Occupation: retired  Tobacco Use   Smoking status: Former    Current packs/day: 0.00    Average packs/day: 1 pack/day for 30.0 years (30.0 ttl pk-yrs)    Types: Cigarettes    Start date: 12/12/1983    Quit date: 12/11/2013    Years since quitting: 9.5   Smokeless tobacco: Former  Building services engineer status: Never Used  Substance and Sexual Activity   Alcohol use: Not Currently   Drug use: No   Sexual activity: Not Currently    Birth control/protection: Post-menopausal  Other Topics Concern   Not on file  Social History Narrative   Right handed   Drinks caffeine   One story home   Social Determinants of Health   Financial Resource Strain: Low Risk  (05/09/2019)   Overall Financial Resource Strain (CARDIA)    Difficulty of Paying Living Expenses: Not hard at all  Food Insecurity: No Food Insecurity (05/09/2019)   Hunger Vital Sign    Worried About Running Out of Food in the Last Year: Never true    Ran Out of Food in the Last Year: Never true  Transportation Needs: No Transportation Needs (05/09/2019)   PRAPARE - Administrator, Civil Service (Medical): No    Lack of Transportation (Non-Medical): No  Physical Activity: Inactive (05/09/2019)   Exercise Vital Sign    Days of Exercise per Week: 0 days    Minutes of Exercise per Session: 0 min  Stress: Not on  file  Social Connections: Not on file  Intimate Partner Violence: Not on file    Allergies: No Known Allergies  Current Medications: Current Outpatient Medications  Medication Sig Dispense Refill   acetaminophen (TYLENOL) 500 MG tablet Take 1,000 mg by mouth 2 (two)  times daily as needed for moderate pain or headache.     baclofen (LIORESAL) 10 MG tablet Take 1 tablet (10 mg total) by mouth 3 (three) times daily as needed for muscle spasms. 1080 tablet 0   calcium carbonate (OSCAL) 1500 (600 Ca) MG TABS tablet Take by mouth 2 (two) times daily with a meal.     diclofenac Sodium (VOLTAREN) 1 % GEL Apply 2 g topically 3 (three) times daily as needed. 100 g 2   fluticasone (FLONASE) 50 MCG/ACT nasal spray PLACE 2 SPRAYS INTO BOTH NOSTRILS AT BEDTIME. 48 g 1   fulvestrant (FASLODEX) 250 MG/5ML injection Inject 500 mg into the muscle every 30 (thirty) days. One injection each buttock over 1-2 minutes. Warm prior to use.     Krill Oil 1000 MG CAPS Take 1,000 mg by mouth daily.     lisinopril (ZESTRIL) 5 MG tablet Take 1 tablet (5 mg total) by mouth daily. 90 tablet 3   loperamide (IMODIUM A-D) 2 MG tablet Take 2 tablets (4 mg total) by mouth 4 (four) times daily as needed for diarrhea or loose stools. 30 tablet 0   metFORMIN (GLUCOPHAGE) 500 MG tablet Take 1 tablet (500 mg total) by mouth 2 (two) times daily with a meal. 180 tablet 3   mometasone (ELOCON) 0.1 % cream Apply to affected skin qd-bid prn flares 45 g 2   omeprazole (PRILOSEC) 40 MG capsule Take 1 capsule (40 mg total) by mouth 2 (two) times daily. 60 capsule 2   ondansetron (ZOFRAN) 8 MG tablet Take 1 tablet (8 mg total) by mouth every 8 (eight) hours as needed for nausea or vomiting. 20 tablet 0   prochlorperazine (COMPAZINE) 10 MG tablet Take 1 tablet (10 mg total) by mouth every 6 (six) hours as needed for nausea or vomiting. 20 tablet 1   ribociclib succ (KISQALI, 600 MG DOSE,) 200 MG Therapy Pack Take 3 tablets (600 mg total) by  mouth daily. Take for 21 days on, 7 days off, repeat every 28 days. 63 tablet 5   sodium chloride (OCEAN) 0.65 % SOLN nasal spray Place 1 spray into both nostrils as needed for congestion.     traZODone (DESYREL) 50 MG tablet Take 1 tablet (50 mg total) by mouth at bedtime as needed. for sleep 90 tablet 3   venlafaxine XR (EFFEXOR-XR) 75 MG 24 hr capsule Take 1 capsule (75 mg total) by mouth daily with breakfast. 360 capsule 0   vitamin B-12 (CYANOCOBALAMIN) 1000 MCG tablet Take 2,500 mcg by mouth every Monday, Wednesday, and Friday.     Vitamin D, Cholecalciferol, 50 MCG (2000 UT) CAPS Take 2,000 Units by mouth every Monday, Wednesday, and Friday.     No current facility-administered medications for this visit.    Review of Systems General: positive for weight gain, generalized weakness Skin: negative for changes in color, texture, moles or lesions Eyes: negative for, changes in vision, pain, diplopia HEENT: negative for, change in hearing, pain, discharge, vertigo, voice changes, sore throat, neck masses. Positive for tinnitus and vision changes.  Pulmonary: negative for, dyspnea, orthopnea, productive cough Cardiac: negative for, palpitations, syncope, pain, discomfort, pressure. Positive for claudication Gastrointestinal: positive for nausea, vomiting, diarrhea, black stools Genitourinary/Sexual: positive for incomplete emptying of bladder Musculoskeletal: positive for pain of legs, joints, and back Hematology: positive for easy bruising Neurologic/Psych:positive for numbness  Objective:  Physical Examination:  BP 109/69   Pulse (!) 119   Temp 97.6 F (36.4 C)   Resp 19  Wt 183 lb 6.4 oz (83.2 kg)   LMP 12/22/1999 (Approximate)   SpO2 99%   BMI 30.52 kg/m     ECOG Performance Status: 1 - Symptomatic but completely ambulatory  General appearance: no acute distress. Alopecia. Facial hair HEENT: sclera clear Neck: no thyroid enlargement or cervical adenopathy Lymph node  survey: non-palpable, axillary, inguinal, supraclavicular Cardiovascular: without murmurs, rubs or gallops Respiratory: clear to auscultation Abdomen: without hepatosplenomegaly Back: inspection of back is normal Extremities: no lower extremity edema Skin exam - normal coloration and turgor, no rashes, no suspicious skin lesions noted. Neurological exam reveals alert, oriented, normal speech, no focal findings or movement disorder noted.  Pelvic: Exam chaperoned by CMA EGBUS: sebaceous cysts on left labia, not infected Cervix: no lesions, nontender, mobile Vagina: no lesions, no discharge or bleeding Uterus: normal size, nontender, mobile Adnexa: no palpable masses Rectovaginal: confirmatory   Lab Results  Component Value Date   WBC 4.1 06/10/2023   HGB 9.9 (L) 06/10/2023   HCT 28.4 (L) 06/10/2023   MCV 106.8 (H) 06/10/2023   PLT 206 06/10/2023     Chemistry      Component Value Date/Time   NA 135 06/10/2023 0946   NA 142 07/14/2019 1516   NA 139 03/11/2015 1356   K 3.5 06/10/2023 0946   K 3.4 (L) 03/11/2015 1356   CL 101 06/10/2023 0946   CL 103 03/11/2015 1356   CO2 21 (L) 06/10/2023 0946   CO2 28 03/11/2015 1356   BUN 28 (H) 06/10/2023 0946   BUN 12 07/14/2019 1516   BUN 13 03/11/2015 1356   CREATININE 1.85 (H) 06/10/2023 0946   CREATININE 0.85 10/20/2021 0838      Component Value Date/Time   CALCIUM 9.1 06/10/2023 0946   CALCIUM 9.2 03/11/2015 1356   ALKPHOS 68 06/10/2023 0946   ALKPHOS 68 03/11/2015 1356   AST 32 06/10/2023 0946   ALT 19 06/10/2023 0946   ALT 32 03/11/2015 1356   BILITOT 0.4 06/10/2023 0946     Radiographic Imaging:  Imaging reviewed per HPI, Assessment, and Plan     Assessment:  Cassidy Norris is a 72 y.o. female diagnosed with metastatic breast cancer and persistent small solid/heterogenous left ovarian mass, 3 cm.  She is taking cdk inhibitor and fulvestrant (since 2/23) for metastatic breast cancer, and this is in good control  with response of supraclavicular nodal disease on serial PET scans.  She was referred because of concern that the SUV of the left ovarian mass has increased from 3.6 last year to 6.6 recently.  However, I reviewed these scans with the radiologist and he is not certain there has actually been a change.  The two PET scans were done with different machines.  Pelvic US did confirm the presence of a small mass 2.7 cm in the left ovary.  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  06/16/23 Testosterone highly elevated to 239, free T elevated 0.8%. AFP, inhibin A/B, CA125 normal.  Discussed with Dr Doloris Hall of [facility] and he does not think the elevated testosterone is due to Fulvestrant.  She has a small left ovarian mass and the symptoms of virilization antedate when she started fulvestrant. So recommend surgical removal of the ovarian tumor.   History of PMB 2019 with polyp on D&C.  No PMB now.  Normal PAPs.  History of  tubal ligation.   Vulvar sebaceous cysts.  Medical co-morbidities complicating care: breast cancer CKD, type 2 diabetes, MS, GERD, aortic atherosclerosis, emphysema and others.  Plan:   Problem List Items Addressed This Visit       Other   Ovarian mass, left - Primary    We discussed options for management including removal of the ovaries and tubes in view of virilization and elevated testosterone. Scheduled for LS BSO, possible staging on 08/04/23.  Discussed with Dr Orlie Dakin and he advised stopping the cdk inhibitor 2 weeks pre op.    Gyn Onc Surgical Risk The risks of surgery were discussed in detail and she understands the using to include infection, wound separation, hernia, vaginal cuff separation, injury to adjacent organs such as bowel, bladder, blood vessels, ureters, and nerves, bleeding which may require blood transfusion, anesthesia  risk (thromboembolic events, possible death), unforeseen complications, possible need for reexploration, medical complications such as heart attack, stroke, pneumonia, and if staging performed the risk of lymphedema and lymphocyst.  The patient will receive DVT and antibiotic prophylaxis is indicated.  She voiced a clear understanding.  She had the opportunity to ask questions and electronic and paper informed consent was obtained today.  Discussed that sebaceous cysts of vulva could be treated with sitz baths.    The patient's diagnosis, an outline of the further diagnostic and laboratory studies which will be required, the recommendation for surgery, and alternatives were discussed with her and her accompanying family members.  All questions were answered to their satisfaction.  Leida Lauth, MD

## 2023-07-08 ENCOUNTER — Encounter: Payer: Self-pay | Admitting: Oncology

## 2023-07-08 ENCOUNTER — Inpatient Hospital Stay: Payer: Medicare Other

## 2023-07-08 ENCOUNTER — Inpatient Hospital Stay (HOSPITAL_BASED_OUTPATIENT_CLINIC_OR_DEPARTMENT_OTHER): Payer: Medicare Other | Admitting: Oncology

## 2023-07-08 ENCOUNTER — Ambulatory Visit: Payer: Medicare Other | Admitting: Pharmacist

## 2023-07-08 VITALS — BP 119/72 | HR 110 | Temp 97.5°F | Resp 16 | Ht 65.0 in | Wt 186.0 lb

## 2023-07-08 DIAGNOSIS — L723 Sebaceous cyst: Secondary | ICD-10-CM | POA: Diagnosis not present

## 2023-07-08 DIAGNOSIS — Z853 Personal history of malignant neoplasm of breast: Secondary | ICD-10-CM | POA: Diagnosis not present

## 2023-07-08 DIAGNOSIS — R000 Tachycardia, unspecified: Secondary | ICD-10-CM | POA: Diagnosis not present

## 2023-07-08 DIAGNOSIS — C50919 Malignant neoplasm of unspecified site of unspecified female breast: Secondary | ICD-10-CM

## 2023-07-08 DIAGNOSIS — D3912 Neoplasm of uncertain behavior of left ovary: Secondary | ICD-10-CM | POA: Diagnosis not present

## 2023-07-08 DIAGNOSIS — Z9013 Acquired absence of bilateral breasts and nipples: Secondary | ICD-10-CM | POA: Diagnosis not present

## 2023-07-08 DIAGNOSIS — C778 Secondary and unspecified malignant neoplasm of lymph nodes of multiple regions: Secondary | ICD-10-CM | POA: Diagnosis not present

## 2023-07-08 LAB — CMP (CANCER CENTER ONLY)
ALT: 19 U/L (ref 0–44)
AST: 27 U/L (ref 15–41)
Albumin: 4.1 g/dL (ref 3.5–5.0)
Alkaline Phosphatase: 73 U/L (ref 38–126)
Anion gap: 10 (ref 5–15)
BUN: 45 mg/dL — ABNORMAL HIGH (ref 8–23)
CO2: 21 mmol/L — ABNORMAL LOW (ref 22–32)
Calcium: 9.6 mg/dL (ref 8.9–10.3)
Chloride: 105 mmol/L (ref 98–111)
Creatinine: 1.86 mg/dL — ABNORMAL HIGH (ref 0.44–1.00)
GFR, Estimated: 29 mL/min — ABNORMAL LOW (ref >60)
Glucose, Bld: 112 mg/dL — ABNORMAL HIGH (ref 70–99)
Potassium: 4 mmol/L (ref 3.5–5.1)
Sodium: 136 mmol/L (ref 135–145)
Total Bilirubin: 0.2 mg/dL — ABNORMAL LOW (ref 0.3–1.2)
Total Protein: 7.6 g/dL (ref 6.5–8.1)

## 2023-07-08 LAB — CBC WITH DIFFERENTIAL/PLATELET
Abs Immature Granulocytes: 0.07 10*3/uL (ref 0.00–0.07)
Basophils Absolute: 0.1 10*3/uL (ref 0.0–0.1)
Basophils Relative: 1 %
Eosinophils Absolute: 0.3 10*3/uL (ref 0.0–0.5)
Eosinophils Relative: 2 %
HCT: 32.2 % — ABNORMAL LOW (ref 36.0–46.0)
Hemoglobin: 10.8 g/dL — ABNORMAL LOW (ref 12.0–15.0)
Immature Granulocytes: 1 %
Lymphocytes Relative: 17 %
Lymphs Abs: 2 10*3/uL (ref 0.7–4.0)
MCH: 35.6 pg — ABNORMAL HIGH (ref 26.0–34.0)
MCHC: 33.5 g/dL (ref 30.0–36.0)
MCV: 106.3 fL — ABNORMAL HIGH (ref 80.0–100.0)
Monocytes Absolute: 1 10*3/uL (ref 0.1–1.0)
Monocytes Relative: 9 %
Neutro Abs: 7.9 10*3/uL — ABNORMAL HIGH (ref 1.7–7.7)
Neutrophils Relative %: 70 %
Platelets: 302 10*3/uL (ref 150–400)
RBC: 3.03 MIL/uL — ABNORMAL LOW (ref 3.87–5.11)
RDW: 13.4 % (ref 11.5–15.5)
WBC: 11.4 10*3/uL — ABNORMAL HIGH (ref 4.0–10.5)
nRBC: 0 % (ref 0.0–0.2)

## 2023-07-08 LAB — MAGNESIUM: Magnesium: 2.2 mg/dL (ref 1.7–2.4)

## 2023-07-08 MED ORDER — FULVESTRANT 250 MG/5ML IM SOSY
500.0000 mg | PREFILLED_SYRINGE | Freq: Once | INTRAMUSCULAR | Status: AC
  Administered 2023-07-08: 500 mg via INTRAMUSCULAR
  Filled 2023-07-08: qty 10

## 2023-07-08 NOTE — Progress Notes (Signed)
Noticed heart rate has stayed elevated over a couple month period was seen by cardiology Dr. Kirke Corin back in 2016 for tachycardia but has not been seen since. Also mentioned feeling hot in her face like she is standing in front of a heater. Has surgery scheduled in Sept 2024 to have ovaries and fallopian tubes removed.

## 2023-07-08 NOTE — Progress Notes (Signed)
[facility]  [phone]  [phone]     BOBBIJO SHEVCHUK DOB: 02-17-1951  MR#: [id number]  YSA#:[id number]  Patient Care Team: Smitty Cords, DO as PCP - General (Family Medicine) Drema Dallas, DO as Consulting Physician (Neurology) Jeralyn Ruths, MD as Consulting Physician (Oncology) Benita Gutter, RN as Oncology Nurse Navigator  CHIEF COMPLAINT: Recurrent, metastatic ER/PR, HER2 negative invasive carcinoma of the breast.    INTERVAL HISTORY: Patient returns to clinic today for repeat laboratory, further evaluation, and continuation of Kisqali and fulvestrant.  Hypermetabolic lesion on her ovary is thought to be a testosterone producing tumor and patient is undergoing surgical resection with gynecology oncology on August 04, 2023.  She currently feels well.  She is tolerating her treatments without significant side effects. She has no new neurologic complaints.  She denies any recent fevers or illnesses.  She has had a poor appetite recently, but denies weight loss.  She has no chest pain, shortness of breath, cough, or hemoptysis.  She denies any nausea, vomiting, constipation, or diarrhea.  She has no urinary complaints.  Patient offers no further specific complaints today.  REVIEW OF SYSTEMS:   Review of Systems  Constitutional: Negative.  Negative for fever, malaise/fatigue and weight loss.  Respiratory: Negative.  Negative for cough, hemoptysis and shortness of breath.   Cardiovascular: Negative.  Negative for chest pain and leg swelling.  Gastrointestinal: Negative.  Negative for abdominal pain.  Genitourinary: Negative.  Negative for dysuria.  Musculoskeletal: Negative.  Negative for joint pain and myalgias.  Skin: Negative.  Negative for rash.  Neurological: Negative.  Negative for dizziness, sensory change, focal weakness, weakness and headaches.  Psychiatric/Behavioral: Negative.  The patient is not nervous/anxious.      As per HPI. Otherwise, a complete review of systems is negative.  ONCOLOGY HISTORY: Oncology History Overview Note  1. Bilateral carcinoma of breast, February, 2015. Right breast status post radical mastectomy. T1 C. N0M0 (isolated tumor cells in one lymph node) multifocal invasive cancer. Stage IC, Left breast, Status post radical mastectomy, T2, N1, M0 tumor. Stage II, RIght breast. Both tumors are estrogen receptor positive.  Progesterone receptor positive.  HER-2 receptor negative by FISH. Negative for BRCA mutation.  2. Started on Adriamycin and Cytoxan on March 01, 2014. Last cycle of Cytoxan and Adriamycin on May 01, 2014. Patient has finished last chemotherapy on September 9. (12 th dose was omitted because of neuropathy) 3.  Taking tamoxifen.  Patient had a poor tolerance to letrozole.(October, 2015) 4.  Patient is taking letrozole since November of 2015 5.  May, 2016 Letrozole has been put on hold because of bony pains 6.  Started on Aromasin from July of 2016    History of bilateral breast cancer (Resolved)  11/29/2013 Initial Diagnosis   Breast cancer bilateral, multifocal ER/PR pos, Her 2 neg,.     PAST MEDICAL HISTORY: Past Medical History:  Diagnosis Date   Anemia    Arthritis not really diagnosis but have pain   B12 deficiency 07/23/2016   Will check levels to determine if injections are needed again. Await results. Recent CBC at Onc WNL   Blood transfusion without reported diagnosis 1983 had a miscarriage/dnc   Cancer (HCC) 12/30/2013   Multifocal disease: pT1c,m, N0(isolated tumor cells) Er/ PR positive, Her 2 negative.   Cancer (HCC) 01/09/2014   Invasive lobular carcinoma, 2.2 cm, T2,N1 ER/PR positive, HER-2/neu negative   Cataract 2014?   Centrilobular emphysema (HCC) 10/27/2021  Complication of anesthesia 03/06/2022   Prolonged metabolism of Rocuronium during case.   Diabetes mellitus without complication (HCC)    Diffuse cystic mastopathy     Erosive esophagitis    Essential hypertension, benign 09/24/2017   Family history of malignant neoplasm of gastrointestinal tract 2012   Fractured elbow 08/28/2014   Gastroesophageal reflux disease    Hx of metabolic acidosis with increased anion gap    Increased heart rate    Multiple sclerosis (HCC)    Neuromuscular disorder (HCC)    neuropathy in bil feet - left is worse.   Obesity, unspecified    Peripheral neuropathy    Personal history of tobacco use, presenting hazards to health    Restless leg syndrome    Sleep apnea    uses CPAP   Special screening for malignant neoplasms, colon    Squamous cell carcinoma of skin 08/24/2022   SCCIS - scalp, ED&C    PAST SURGICAL HISTORY: Past Surgical History:  Procedure Laterality Date   BREAST BIOPSY Right 1973,2001   BREAST BIOPSY Right 11/07/2013   INVASIVE MAMMARY CARCINOMA , ER/PR positive Her2 negative   BREAST BIOPSY Left 11/27/2013   invasive lobular and DCIS   BREAST SURGERY Bilateral 01/09/2014   mastectomy   cataract surgery Left 08/13/2022   CHOLECYSTECTOMY     COLONOSCOPY  2010   Dr. Evette Cristal   COLONOSCOPY WITH PROPOFOL N/A 06/11/2015   Procedure: COLONOSCOPY WITH PROPOFOL;  Surgeon: Kieth Brightly, MD;  Location: [facility] ENDOSCOPY;  Service: Endoscopy;  Laterality: N/A;   COLONOSCOPY WITH PROPOFOL N/A 12/23/2021   Procedure: COLONOSCOPY WITH PROPOFOL;  Surgeon: Toney Reil, MD;  Location: [facility] ENDOSCOPY;  Service: Gastroenterology;  Laterality: N/A;   DILATATION & CURETTAGE/HYSTEROSCOPY WITH MYOSURE N/A 07/12/2018   Procedure: DILATATION & CURETTAGE/HYSTEROSCOPY WITH MYOSURE WITH ENDOMETRIAL POLYPECTOMY;  Surgeon: Conard Novak, MD;  Location: [facility] ORS;  Service: Gynecology;  Laterality: N/A;   DILATION AND CURETTAGE OF UTERUS  1983   ESOPHAGOGASTRODUODENOSCOPY N/A 12/23/2021   Procedure: ESOPHAGOGASTRODUODENOSCOPY (EGD);  Surgeon: Toney Reil, MD;  Location: [facility] ENDOSCOPY;  Service:  Gastroenterology;  Laterality: N/A;   JOINT REPLACEMENT  11/23/2015   KNEE ARTHROPLASTY Right 03/06/2022   Procedure: COMPUTER ASSISTED TOTAL KNEE ARTHROPLASTY;  Surgeon: Donato Heinz, MD;  Location: [facility] ORS;  Service: Orthopedics;  Laterality: Right;   POLYPECTOMY  1989   PORTA CATH REMOVAL     PORTACATH PLACEMENT  2015   SKIN BIOPSY     on scalp   SPINE SURGERY  09/14/2014   Ruptured disk L4- L5   TOTAL KNEE ARTHROPLASTY Left 11/22/2015   Procedure: TOTAL KNEE ARTHROPLASTY;  Surgeon: Frederico Hamman, MD;  Location: [facility] OR;  Service: Orthopedics;  Laterality: Left;   TUBAL LIGATION     WISDOM TOOTH EXTRACTION  2006    FAMILY HISTORY Family History  Problem Relation Age of Onset   Cancer Mother        lung   COPD Mother    Cancer Father        colon, stomach   Diabetes Maternal Grandmother    Hypertension Sister    Rectal cancer Paternal Uncle    Rectal cancer Paternal Uncle     GYNECOLOGIC HISTORY:  Patient's last menstrual period was 12/22/1999 (approximate).     ADVANCED DIRECTIVES:    HEALTH MAINTENANCE: Social History   Tobacco Use   Smoking status: Former    Current packs/day: 0.00    Average packs/day: 1 pack/day for 30.0  years (30.0 ttl pk-yrs)    Types: Cigarettes    Start date: 12/12/1983    Quit date: 12/11/2013    Years since quitting: 9.5   Smokeless tobacco: Former  Building services engineer status: Never Used  Substance Use Topics   Alcohol use: Not Currently   Drug use: No     Colonoscopy:  PAP:  Bone density:  Mammogram:  No Known Allergies  Current Outpatient Medications  Medication Sig Dispense Refill   acetaminophen (TYLENOL) 500 MG tablet Take 1,000 mg by mouth 2 (two) times daily as needed for moderate pain or headache.     baclofen (LIORESAL) 10 MG tablet Take 1 tablet (10 mg total) by mouth 3 (three) times daily as needed for muscle spasms. 1080 tablet 0   calcium carbonate (OSCAL) 1500 (600 Ca) MG TABS tablet Take by mouth 2 (two)  times daily with a meal.     diclofenac Sodium (VOLTAREN) 1 % GEL Apply 2 g topically 3 (three) times daily as needed. 100 g 2   fluticasone (FLONASE) 50 MCG/ACT nasal spray PLACE 2 SPRAYS INTO BOTH NOSTRILS AT BEDTIME. 48 g 1   fulvestrant (FASLODEX) 250 MG/5ML injection Inject 500 mg into the muscle every 30 (thirty) days. One injection each buttock over 1-2 minutes. Warm prior to use.     Krill Oil 1000 MG CAPS Take 1,000 mg by mouth daily.     lisinopril (ZESTRIL) 5 MG tablet Take 1 tablet (5 mg total) by mouth daily. 90 tablet 3   loperamide (IMODIUM A-D) 2 MG tablet Take 2 tablets (4 mg total) by mouth 4 (four) times daily as needed for diarrhea or loose stools. 30 tablet 0   metFORMIN (GLUCOPHAGE) 500 MG tablet Take 1 tablet (500 mg total) by mouth 2 (two) times daily with a meal. 180 tablet 3   mometasone (ELOCON) 0.1 % cream Apply to affected skin qd-bid prn flares 45 g 2   omeprazole (PRILOSEC) 40 MG capsule Take 1 capsule (40 mg total) by mouth 2 (two) times daily. 60 capsule 2   ondansetron (ZOFRAN) 8 MG tablet Take 1 tablet (8 mg total) by mouth every 8 (eight) hours as needed for nausea or vomiting. 20 tablet 0   prochlorperazine (COMPAZINE) 10 MG tablet Take 1 tablet (10 mg total) by mouth every 6 (six) hours as needed for nausea or vomiting. 20 tablet 1   ribociclib succ (KISQALI, 600 MG DOSE,) 200 MG Therapy Pack Take 3 tablets (600 mg total) by mouth daily. Take for 21 days on, 7 days off, repeat every 28 days. 63 tablet 5   sodium chloride (OCEAN) 0.65 % SOLN nasal spray Place 1 spray into both nostrils as needed for congestion.     traZODone (DESYREL) 50 MG tablet Take 1 tablet (50 mg total) by mouth at bedtime as needed. for sleep 90 tablet 3   venlafaxine XR (EFFEXOR-XR) 75 MG 24 hr capsule Take 1 capsule (75 mg total) by mouth daily with breakfast. 360 capsule 0   vitamin B-12 (CYANOCOBALAMIN) 1000 MCG tablet Take 2,500 mcg by mouth every Monday, Wednesday, and Friday.      Vitamin D, Cholecalciferol, 50 MCG (2000 UT) CAPS Take 2,000 Units by mouth every Monday, Wednesday, and Friday.     No current facility-administered medications for this visit.    OBJECTIVE: BP 119/72 (BP Location: Left Arm, Patient Position: Sitting, Cuff Size: Normal)   Pulse (!) 110   Temp (!) 97.5 F (36.4 C) (Tympanic)  Resp 16   Ht 5\' 5"  (1.651 m)   Wt 186 lb (84.4 kg)   LMP 12/22/1999 (Approximate)   SpO2 100%   BMI 30.95 kg/m    Body mass index is 30.95 kg/m.    ECOG FS:0 - Asymptomatic  General: Well-developed, well-nourished, no acute distress. Eyes: Pink conjunctiva, anicteric sclera. HEENT: Normocephalic, moist mucous membranes. Lungs: No audible wheezing or coughing. Heart: Regular rate and rhythm. Abdomen: Soft, nontender, no obvious distention. Musculoskeletal: No edema, cyanosis, or clubbing. Neuro: Alert, answering all questions appropriately. Cranial nerves grossly intact. Skin: No rashes or petechiae noted. Psych: Normal affect.  LAB RESULTS:  Appointment on 07/08/2023  Component Date Value Ref Range Status   Magnesium 07/08/2023 2.2  1.7 - 2.4 mg/dL Final   Performed at [facility], [address] [place], [place] [address]   Sodium 07/08/2023 136  135 - 145 mmol/L Final   Potassium 07/08/2023 4.0  3.5 - 5.1 mmol/L Final   Chloride 07/08/2023 105  98 - 111 mmol/L Final   CO2 07/08/2023 21 (L)  22 - 32 mmol/L Final   Glucose, Bld 07/08/2023 112 (H)  70 - 99 mg/dL Final   Glucose reference range applies only to samples taken after fasting for at least 8 hours.   BUN 07/08/2023 45 (H)  8 - 23 mg/dL Final   Creatinine 60/45/4098 1.86 (H)  0.44 - 1.00 mg/dL Final   Calcium 11/91/4782 9.6  8.9 - 10.3 mg/dL Final   Total Protein 95/62/1308 7.6  6.5 - 8.1 g/dL Final   Albumin 65/78/4696 4.1  3.5 - 5.0 g/dL Final   AST 29/52/8413 27  15 - 41 U/L Final   ALT 07/08/2023 19  0 - 44 U/L Final   Alkaline Phosphatase 07/08/2023 73  38 - 126 U/L Final    Total Bilirubin 07/08/2023 0.2 (L)  0.3 - 1.2 mg/dL Final   GFR, Estimated 07/08/2023 29 (L)  >60 mL/min Final   Comment: (NOTE) Calculated using the CKD-EPI Creatinine Equation (2021)    Anion gap 07/08/2023 10  5 - 15 Final   Performed at [facility], [address] [place], [place] [address]   WBC 07/08/2023 11.4 (H)  4.0 - 10.5 K/uL Final   RBC 07/08/2023 3.03 (L)  3.87 - 5.11 MIL/uL Final   Hemoglobin 07/08/2023 10.8 (L)  12.0 - 15.0 g/dL Final   HCT 02/72/5366 32.2 (L)  36.0 - 46.0 % Final   MCV 07/08/2023 106.3 (H)  80.0 - 100.0 fL Final   MCH 07/08/2023 35.6 (H)  26.0 - 34.0 pg Final   MCHC 07/08/2023 33.5  30.0 - 36.0 g/dL Final   RDW 44/01/4741 13.4  11.5 - 15.5 % Final   Platelets 07/08/2023 302  150 - 400 K/uL Final   nRBC 07/08/2023 0.0  0.0 - 0.2 % Final   Neutrophils Relative % 07/08/2023 70  % Final   Neutro Abs 07/08/2023 7.9 (H)  1.7 - 7.7 K/uL Final   Lymphocytes Relative 07/08/2023 17  % Final   Lymphs Abs 07/08/2023 2.0  0.7 - 4.0 K/uL Final   Monocytes Relative 07/08/2023 9  % Final   Monocytes Absolute 07/08/2023 1.0  0.1 - 1.0 K/uL Final   Eosinophils Relative 07/08/2023 2  % Final   Eosinophils Absolute 07/08/2023 0.3  0.0 - 0.5 K/uL Final   Basophils Relative 07/08/2023 1  % Final   Basophils Absolute 07/08/2023 0.1  0.0 - 0.1 K/uL Final   Immature Granulocytes  07/08/2023 1  % Final   Abs Immature Granulocytes 07/08/2023 0.07  0.00 - 0.07 K/uL Final   Performed at [facility], [address] [place], [place] [address]    STUDIES: No results found.  ONCOLOGY HISTORY:  Bilateral adenocarcinoma of the breast. Patient is status post bilateral mastectomy in February 2015. She also received adjuvant chemotherapy with Adriamycin, Cytoxan, and Taxol completing treatment in approximately October 2015. She then initiated letrozole, but could not tolerate secondary to leg pain and was switched to Aromasin.  Patient then switched to tamoxifen in  October 2019 secondary to cost.  Patient subsequently discontinued tamoxifen and did not complete the recommended 5 years of treatment.   ASSESSMENT: Recurrent, metastatic ER/PR, HER2 negative invasive carcinoma of the breast.   PLAN:    Recurrent, metastatic ER/PR, HER2 negative invasive carcinoma of the breast: See oncology history as above.  Biopsy confirmed recurrent disease.  PET scan results from December 26, 2021 reviewed independently with hypermetabolic left supraclavicular, subpectoral, and axillary lymphadenopathy consistent with nodal recurrence of patient's history of breast cancer.  No distant metastases were identified.  Her most recent PET scan on May 26, 2023 reviewed independently with improved disease burden. Her initial CA 27-29 initially was 260.1.  Now seems to have plateaued between 81.1 and 96.1.  Her most recent result was 96.1.  Continue Kisqali 600 mg daily for 21 days with 7 days off.  Proceed with fulvestrant today.  Patient will hold his quality in preparation for surgery on August 04, 2023.  Return to clinic in 5 weeks for further evaluation and continuation of treatment.  Osteopenia: Patient's last bone mineral density was on August 01, 2016 and revealed a T-score of -1.2. Continue monitoring bone mineral density by PCP. Multiple sclerosis: Chronic and unchanged.  Continue evaluation and treatment per neurology.  Recent MRI of the brain did not reveal any new lesions. Renal insufficiency: Chronic and unchanged.  Patient's GFR is stable at 29.  Kisqali may need to be dose reduced if GFR remains persistently less than 30. Anemia: Hemoglobin improved to 10.8, monitor. Diarrhea: Patient does not complain of this today.  Continue Imodium as needed.  Reflux: Patient was given a refill of her omeprazole today. Ovarian mass: Hypermetabolic on PET scan and likely a testosterone producing tumor.  Patient will undergo surgical resection with gynecology oncology on August 04, 2023.  Hold Kisquali as above.  Follow-up as above.  Patient expressed understanding and was in agreement with this plan. She also understands that She can call clinic at any time with any questions, concerns, or complaints.   Breast cancer bilateral, multifocal ER/PR pos, Her 2 neg.   Staging form: Breast, AJCC 7th Edition     Clinical: Stage IIB (T2, N1, M0) - Signed by Johney Maine, MD on 04/22/2015   Jeralyn Ruths, MD   07/08/2023 1:09 PM

## 2023-07-09 LAB — CANCER ANTIGEN 27.29: CA 27.29: 98.5 U/mL — ABNORMAL HIGH (ref 0.0–38.6)

## 2023-07-13 ENCOUNTER — Ambulatory Visit (INDEPENDENT_AMBULATORY_CARE_PROVIDER_SITE_OTHER): Payer: Medicare Other | Admitting: Family Medicine

## 2023-07-13 ENCOUNTER — Encounter: Payer: Self-pay | Admitting: Family Medicine

## 2023-07-13 ENCOUNTER — Other Ambulatory Visit: Payer: Self-pay | Admitting: Family Medicine

## 2023-07-13 ENCOUNTER — Other Ambulatory Visit: Payer: Self-pay | Admitting: Oncology

## 2023-07-13 VITALS — BP 120/65 | HR 100 | Ht 65.0 in | Wt 184.0 lb

## 2023-07-13 DIAGNOSIS — E1169 Type 2 diabetes mellitus with other specified complication: Secondary | ICD-10-CM | POA: Diagnosis not present

## 2023-07-13 DIAGNOSIS — R809 Proteinuria, unspecified: Secondary | ICD-10-CM | POA: Diagnosis not present

## 2023-07-13 DIAGNOSIS — N184 Chronic kidney disease, stage 4 (severe): Secondary | ICD-10-CM | POA: Diagnosis not present

## 2023-07-13 DIAGNOSIS — K219 Gastro-esophageal reflux disease without esophagitis: Secondary | ICD-10-CM

## 2023-07-13 HISTORY — DX: Chronic kidney disease, stage 4 (severe): N18.4

## 2023-07-13 NOTE — Progress Notes (Signed)
Subjective:    Patient ID: Cassidy Norris, female    DOB: 06-26-1951, 72 y.o.   MRN: [id number]  Cassidy Norris is a 72 y.o. female presenting on 07/13/2023 for Results (Patient wants to discuss lab results. Says she is concerned about her kidney function.)   HPI  Hypertension Creatinine CKD-IV Followed by Oncology - on chemotherapy Followed by Nephrology [facility] Dr Thedore Mins Progression of Cr from 1.4 to 1.8 to 2.0 range now over >6 months Additional chemistry has improved and normalized. Still slightly low CO2 Today she wants to review lab results. Not familiar with labs and explanation regarding kidney function      07/13/2023    8:40 AM 04/30/2023    8:22 AM 03/19/2023   11:47 AM  Depression screen PHQ 2/9  Decreased Interest 0 1 0  Down, Depressed, Hopeless 0 0 0  PHQ - 2 Score 0 1 0  Altered sleeping 1 2 2   Tired, decreased energy 1 2 2   Change in appetite 0 1 2  Feeling bad or failure about yourself  0 0 0  Trouble concentrating 0 0 0  Moving slowly or fidgety/restless 0 0 2  Suicidal thoughts 0 0 0  PHQ-9 Score 2 6 8   Difficult doing work/chores Not difficult at all Not difficult at all Not difficult at all    Social History   Tobacco Use   Smoking status: Former    Current packs/day: 0.00    Average packs/day: 1 pack/day for 30.0 years (30.0 ttl pk-yrs)    Types: Cigarettes    Start date: 12/12/1983    Quit date: 12/11/2013    Years since quitting: 9.5   Smokeless tobacco: Former  Building services engineer status: Never Used  Substance Use Topics   Alcohol use: Not Currently   Drug use: No    Review of Systems Per HPI unless specifically indicated above     Objective:    BP 120/65   Pulse 100   Ht 5\' 5"  (1.651 m)   Wt 184 lb (83.5 kg)   LMP 12/22/1999 (Approximate)   SpO2 97%   BMI 30.62 kg/m   Wt Readings from Last 3 Encounters:  07/13/23 184 lb (83.5 kg)  07/08/23 186 lb (84.4 kg)  07/07/23 183 lb 6.4 oz (83.2 kg)    Physical Exam Vitals and  nursing note reviewed.  Constitutional:      General: She is not in acute distress.    Appearance: Normal appearance. She is well-developed. She is not diaphoretic.     Comments: Well-appearing, comfortable, cooperative  HENT:     Head: Normocephalic and atraumatic.  Eyes:     General:        Right eye: No discharge.        Left eye: No discharge.     Conjunctiva/sclera: Conjunctivae normal.  Cardiovascular:     Rate and Rhythm: Normal rate.  Pulmonary:     Effort: Pulmonary effort is normal.  Skin:    General: Skin is warm and dry.     Findings: No erythema or rash.  Neurological:     Mental Status: She is alert and oriented to person, place, and time.  Psychiatric:        Mood and Affect: Mood normal.        Behavior: Behavior normal.        Thought Content: Thought content normal.     Comments: Well groomed, good eye contact, normal speech and thoughts  Results for orders placed or performed in visit on 07/08/23  Cancer antigen 27.29  Result Value Ref Range   CA 27.29 98.5 (H) 0.0 - 38.6 U/mL  Magnesium  Result Value Ref Range   Magnesium 2.2 1.7 - 2.4 mg/dL  CMP (Cancer Center only)  Result Value Ref Range   Sodium 136 135 - 145 mmol/L   Potassium 4.0 3.5 - 5.1 mmol/L   Chloride 105 98 - 111 mmol/L   CO2 21 (L) 22 - 32 mmol/L   Glucose, Bld 112 (H) 70 - 99 mg/dL   BUN 45 (H) 8 - 23 mg/dL   Creatinine 4.09 (H) 8.11 - 1.00 mg/dL   Calcium 9.6 8.9 - 91.4 mg/dL   Total Protein 7.6 6.5 - 8.1 g/dL   Albumin 4.1 3.5 - 5.0 g/dL   AST 27 15 - 41 U/L   ALT 19 0 - 44 U/L   Alkaline Phosphatase 73 38 - 126 U/L   Total Bilirubin 0.2 (L) 0.3 - 1.2 mg/dL   GFR, Estimated 29 (L) >60 mL/min   Anion gap 10 5 - 15  CBC with Differential/Platelet  Result Value Ref Range   WBC 11.4 (H) 4.0 - 10.5 K/uL   RBC 3.03 (L) 3.87 - 5.11 MIL/uL   Hemoglobin 10.8 (L) 12.0 - 15.0 g/dL   HCT 78.2 (L) 95.6 - 21.3 %   MCV 106.3 (H) 80.0 - 100.0 fL   MCH 35.6 (H) 26.0 - 34.0 pg   MCHC  33.5 30.0 - 36.0 g/dL   RDW 08.6 57.8 - 46.9 %   Platelets 302 150 - 400 K/uL   nRBC 0.0 0.0 - 0.2 %   Neutrophils Relative % 70 %   Neutro Abs 7.9 (H) 1.7 - 7.7 K/uL   Lymphocytes Relative 17 %   Lymphs Abs 2.0 0.7 - 4.0 K/uL   Monocytes Relative 9 %   Monocytes Absolute 1.0 0.1 - 1.0 K/uL   Eosinophils Relative 2 %   Eosinophils Absolute 0.3 0.0 - 0.5 K/uL   Basophils Relative 1 %   Basophils Absolute 0.1 0.0 - 0.1 K/uL   Immature Granulocytes 1 %   Abs Immature Granulocytes 0.07 0.00 - 0.07 K/uL      Assessment & Plan:   Problem List Items Addressed This Visit     CKD (chronic kidney disease), stage IV (HCC) - Primary   Microalbuminuria   Type 2 diabetes mellitus with other specified complication (HCC)   Relevant Medications   dapagliflozin propanediol (FARXIGA) 10 MG TABS tablet    Reviewed labs and Nephrology as well as Oncology notes  Discussed that gradual decline of kidney function can be expected and may be accelerated by various other health conditions with BP and cancer among them.  She is currently on low dose ACEi, SGLT2 to help preserve kidney function  She is following with Nephrology. We discussed meaning with some of the lab results and answered her questions today.  No significant changes to the treatment plan were made  Encouraged renal healthy diet, avoiding high protein and high K foods. As per AVS   No orders of the defined types were placed in this encounter.     Follow up plan: Return if symptoms worsen or fail to improve, for Follow-up as planned.   Saralyn Pilar, DO [facility] 07/13/2023, 9:03 AM

## 2023-07-13 NOTE — Patient Instructions (Addendum)
Thank you for coming to the office today.   Food Basics for Chronic Kidney Disease Chronic kidney disease (CKD) is when your kidneys are not working well. They cannot remove waste, fluids, and other substances from your blood the way they should. These substances can build up, which can worsen kidney damage and affect how your body works. Eating certain foods can lead to a buildup of these substances. Changing your diet can help prevent more kidney damage. Diet changes may also delay dialysis or even keep you from needing it. What nutrients should I limit? Work with your treatment team and a food expert (dietitian) to make a meal plan that's right for you. Foods you can eat and foods you should limit or avoid will depend on the stage of your kidney disease and any other health conditions you have. The items listed below are not a complete list. Talk with your dietitian to learn what is best for you. Potassium Potassium affects how steadily your heart beats. Too much potassium in your blood can cause an irregular heartbeat or even a heart attack. You may need to limit foods that are high in potassium, such as: Liquid milk and soy milk. Salt substitutes that contain potassium. Fruits like bananas, apricots, nectarines, melon, prunes, raisins, kiwi, and oranges. Vegetables, such as potatoes, sweet potatoes, yams, tomatoes, leafy greens, beets, avocado, pumpkin, and winter squash. Beans, like lima beans. Nuts. Phosphorus Phosphorus is a mineral found in your bones. You need a balance between calcium and phosphorus to build and maintain healthy bones. Too much added phosphorus from the foods you eat can pull calcium from your bones. Losing calcium can make your bones weak and more likely to break. Too much phosphorus can also make your skin itch. You may need to limit foods that are high in phosphorus or that have added phosphorus, such as: Liquid milk and dairy products. Dark-colored sodas or soft  drinks. Bran cereals and oatmeal. Protein  Protein helps you make and keep muscle. Protein also helps to repair your body's cells and tissues. One of the natural breakdown products of protein is a waste product called urea. When your kidneys are not working well, they cannot remove types of waste like urea. Reducing protein in your diet can help keep urea from building up in your blood. Depending on your stage of kidney disease, you may need to eat smaller portions of foods that are high in protein. Sources of animal protein include: Meat (all types). Fish and seafood. Poultry. Eggs. Dairy. Other protein foods include: Beans and legumes. Nuts and nut butter. Soy, like tofu.  Sodium Salt (sodium) helps to keep a healthy balance of fluids in your body. Too much salt can increase your blood pressure, which can harm your heart and lungs. Extra salt can also cause your body to keep too much fluid, making your kidneys work harder. You may need to limit or avoid foods that are high in salt, such as: Salt seasonings. Soy and teriyaki sauce. Packaged, precooked, cured, or processed meats, such as sausages or meat loaves. Sardines. Salted crackers and snack foods. Fast food. Canned soups and most canned foods. Pickled foods. Vegetable juice. Boxed mixes or ready-to-eat boxed meals and side dishes. Bottled dressings, sauces, and marinades. Talk with your dietitian about how much potassium, phosphorus, protein, and salt you may have each day. Helpful tips Read food labels  Check the amount of salt in foods. Limit foods that have salt or sodium listed among the first five  ingredients. Try to eat low-salt foods. Check the ingredient list for added phosphorus or potassium. "Phos" in an ingredient is a sign that phosphorus has been added. Do not buy foods that are calcium-enriched or that have calcium added to them (are fortified). Buy canned vegetables and beans that say "no salt added" and  rinse them before eating. Lifestyle Limit the amount of protein you eat from animal sources each day. Focus on protein from plant sources, like tofu and dried beans, peas, and lentils. Do not add salt to food when cooking or before eating. Do not eat star fruit. It can be toxic for people with kidney problems. Talk with your health care provider before taking any vitamin or mineral supplements. If told by your health care provider, track how much liquid you drink so you can avoid drinking too much. You may need to include foods you eat that are made mostly from water, like gelatin, ice cream, soups, and juicy fruits and vegetables. If you have diabetes: If you have diabetes (diabetes mellitus) and CKD, you need to keep your blood sugar (glucose) in the target range recommended by your health care provider. Follow your diabetes management plan. This may include: Checking your blood glucose regularly. Taking medicines by mouth, or taking insulin, or both. Exercising for at least 30 minutes on 5 or more days each week, or as told by your health care provider. Tracking how many servings of carbohydrates you eat at each meal. Not using orange juice to treat low blood sugars. Instead, use apple juice, cranberry juice, or clear soda. You may be given guidelines on what foods and nutrients you may eat, and how much you can have each day. This depends on your stage of kidney disease and whether you have high blood pressure (hypertension). Follow the meal plan your dietitian gives you. To learn more: [employer] of Diabetes and Digestive and Kidney Diseases: [employer]: kidney.org Summary Chronic kidney disease (CKD) is when your kidneys are not working well. They cannot remove waste, fluids, and other substances from your blood the way they should. These substances can build up, which can worsen kidney damage and affect how your body works. Changing your diet can help  prevent more kidney damage. Diet changes may also delay dialysis or even keep you from needing it. Diet changes are different for each person with CKD. Work with a dietitian to set up a meal plan that is right for you. This information is not intended to replace advice given to you by your health care provider. Make sure you discuss any questions you have with your health care provider. Document Revised: 02/26/2022 Document Reviewed: 03/04/2020 Elsevier Patient Education  2024 Elsevier Inc.   Please schedule a Follow-up Appointment to: Return if symptoms worsen or fail to improve, for Follow-up as planned.  If you have any other questions or concerns, please feel free to call the office or send a message through MyChart. You may also schedule an earlier appointment if necessary.  Additionally, you may be receiving a survey about your experience at our office within a few days to 1 week by e-mail or mail. We value your feedback.  Saralyn Pilar, DO [facility], [place]

## 2023-07-14 ENCOUNTER — Encounter: Payer: Self-pay | Admitting: Oncology

## 2023-07-14 NOTE — Telephone Encounter (Signed)
Requested Prescriptions  Pending Prescriptions Disp Refills   famotidine (PEPCID) 20 MG tablet [Pharmacy Med Name: FAMOTIDINE 20 MG TABLET] 180 tablet 1    Sig: TAKE 1 TABLET BY MOUTH TWICE A DAY BEFORE MEALS     Gastroenterology:  H2 Antagonists Passed - 07/13/2023  6:17 PM      Passed - Valid encounter within last 12 months    Recent Outpatient Visits           Yesterday CKD (chronic kidney disease), stage IV [facility])   [place] [facility], Netta Neat, DO   2 months ago Essential hypertension, benign   [place] [facility] [place], Netta Neat, DO   8 months ago Essential hypertension, benign   [place] [facility] Smitty Cords, DO   1 year ago Pruritic erythematous rash   [place] [facility] [place], Netta Neat, DO   1 year ago Type 2 diabetes mellitus with other specified complication, without long-term current use of insulin [facility])   [place] [name] [place], Netta Neat, DO       Future Appointments             In 6 days Deirdre Evener, MD [facility]   In 3 months Althea Charon, Netta Neat, DO [facility]

## 2023-07-15 ENCOUNTER — Ambulatory Visit (INDEPENDENT_AMBULATORY_CARE_PROVIDER_SITE_OTHER): Payer: Medicare Other

## 2023-07-15 DIAGNOSIS — Z0000 Encounter for general adult medical examination without abnormal findings: Secondary | ICD-10-CM

## 2023-07-15 NOTE — Patient Instructions (Addendum)
Cassidy Norris , Thank you for taking time to come for your Medicare Wellness Visit. I appreciate your ongoing commitment to your health goals. Please review the following plan we discussed and let me know if I can assist you in the future.   Referrals/Orders/Follow-Ups/Clinician Recommendations: none  This is a list of the screening recommended for you and due dates:  Health Maintenance  Topic Date Due   COVID-19 Vaccine (1) Never done   Zoster (Shingles) Vaccine (1 of 2) Never done   Screening for Lung Cancer  12/03/2022   Flu Shot  06/24/2023   Eye exam for diabetics  07/04/2023   Pneumonia Vaccine (1 of 2 - PCV) 10/30/2023*   Yearly kidney health urinalysis for diabetes  10/30/2023   Complete foot exam   10/30/2023   Hemoglobin A1C  10/30/2023   Yearly kidney function blood test for diabetes  07/07/2024   Medicare Annual Wellness Visit  07/14/2024   DTaP/Tdap/Td vaccine (2 - Td or Tdap) 07/21/2025   Colon Cancer Screening  12/23/2026   DEXA scan (bone density measurement)  Completed   Hepatitis C Screening  Completed   HPV Vaccine  Aged Out  *Topic was postponed. The date shown is not the original due date.    Advanced directives: (In Chart) A copy of your advanced directives are scanned into your chart should your provider ever need it.  Next Medicare Annual Wellness Visit scheduled for next year: Yes   07/20/24 @ 8:15 am by phone

## 2023-07-15 NOTE — Progress Notes (Signed)
Subjective:   Cassidy Norris is a 72 y.o. female who presents for Medicare Annual (Subsequent) preventive examination.  Visit Complete: Virtual  I connected with  Cassidy Norris on 07/15/23 by a audio enabled telemedicine application and verified that I am speaking with the correct person using two identifiers.  Patient Location: Home  Provider Location: Office/Clinic  I discussed the limitations of evaluation and management by telemedicine. The patient expressed understanding and agreed to proceed.  Vital Signs: Unable to obtain new vitals due to this being a telehealth visit.  Review of Systems     Cardiac Risk Factors include: advanced age (>11men, >3 women);diabetes mellitus;dyslipidemia;hypertension;smoking/ tobacco exposure;obesity (BMI >30kg/m2)     Objective:    Today's Vitals   07/15/23 0830  PainSc: 4    There is no height or weight on file to calculate BMI.     07/15/2023    8:35 AM 07/08/2023   10:01 AM 07/07/2023   11:05 AM 06/16/2023    9:38 AM 06/10/2023   10:08 AM 04/15/2023   10:22 AM 02/18/2023   10:14 AM  Advanced Directives  Does Patient Have a Medical Advance Directive? Yes Yes Yes Yes Yes Yes Yes  Type of Estate agent of [place];Living will Healthcare Power of [place];Living will Living will;Healthcare Power of [employer] Power of [place];Living will Healthcare Power of [place];Living will Healthcare Power of [place];Living will Healthcare Power of [place];Living will  Does patient want to make changes to medical advance directive? No - Patient declined  Yes (ED - Information included in AVS) Yes (ED - Information included in AVS)     Copy of Healthcare Power of Attorney in Chart? Yes - validated most recent copy scanned in chart (See row information)        Would patient like information on creating a medical advance directive? No - Patient declined  Yes (ED - Information included in AVS) Yes (ED - Information  included in AVS)       Current Medications (verified) Outpatient Encounter Medications as of 07/15/2023  Medication Sig   acetaminophen (TYLENOL) 500 MG tablet Take 1,000 mg by mouth 2 (two) times daily as needed for moderate pain or headache.   baclofen (LIORESAL) 10 MG tablet Take 1 tablet (10 mg total) by mouth 3 (three) times daily as needed for muscle spasms.   calcium carbonate (OSCAL) 1500 (600 Ca) MG TABS tablet Take by mouth 2 (two) times daily with a meal.   dapagliflozin propanediol (FARXIGA) 10 MG TABS tablet Take 10 mg by mouth daily.   diclofenac Sodium (VOLTAREN) 1 % GEL Apply 2 g topically 3 (three) times daily as needed.   fluticasone (FLONASE) 50 MCG/ACT nasal spray PLACE 2 SPRAYS INTO BOTH NOSTRILS AT BEDTIME.   fulvestrant (FASLODEX) 250 MG/5ML injection Inject 500 mg into the muscle every 30 (thirty) days. One injection each buttock over 1-2 minutes. Warm prior to use.   Krill Oil 1000 MG CAPS Take 1,000 mg by mouth daily.   lisinopril (ZESTRIL) 5 MG tablet Take 1 tablet (5 mg total) by mouth daily.   loperamide (IMODIUM A-D) 2 MG tablet Take 2 tablets (4 mg total) by mouth 4 (four) times daily as needed for diarrhea or loose stools.   metFORMIN (GLUCOPHAGE) 500 MG tablet Take 1 tablet (500 mg total) by mouth 2 (two) times daily with a meal.   mometasone (ELOCON) 0.1 % cream Apply to affected skin qd-bid prn flares   omeprazole (PRILOSEC) 40 MG capsule  TAKE 1 CAPSULE BY MOUTH TWICE A DAY   ondansetron (ZOFRAN) 8 MG tablet Take 1 tablet (8 mg total) by mouth every 8 (eight) hours as needed for nausea or vomiting.   prochlorperazine (COMPAZINE) 10 MG tablet Take 1 tablet (10 mg total) by mouth every 6 (six) hours as needed for nausea or vomiting.   ribociclib succ (KISQALI, 600 MG DOSE,) 200 MG Therapy Pack Take 3 tablets (600 mg total) by mouth daily. Take for 21 days on, 7 days off, repeat every 28 days.   sodium chloride (OCEAN) 0.65 % SOLN nasal spray Place 1 spray into  both nostrils as needed for congestion.   traZODone (DESYREL) 50 MG tablet Take 1 tablet (50 mg total) by mouth at bedtime as needed. for sleep   venlafaxine XR (EFFEXOR-XR) 75 MG 24 hr capsule Take 1 capsule (75 mg total) by mouth daily with breakfast.   vitamin B-12 (CYANOCOBALAMIN) 1000 MCG tablet Take 2,500 mcg by mouth every Monday, Wednesday, and Friday.   Vitamin D, Cholecalciferol, 50 MCG (2000 UT) CAPS Take 2,000 Units by mouth every Monday, Wednesday, and Friday.   No facility-administered encounter medications on file as of 07/15/2023.    Allergies (verified) Patient has no known allergies.   History: Past Medical History:  Diagnosis Date   Anemia    Arthritis not really diagnosis but have pain   B12 deficiency 07/23/2016   Will check levels to determine if injections are needed again. Await results. Recent CBC at [facility]   Blood transfusion without reported diagnosis 1983 had a miscarriage/dnc   Cancer (HCC) 12/30/2013   Multifocal disease: pT1c,m, N0(isolated tumor cells) Er/ PR positive, Her 2 negative.   Cancer (HCC) 01/09/2014   Invasive lobular carcinoma, 2.2 cm, T2,N1 ER/PR positive, HER-2/neu negative   Cataract 2014?   Centrilobular emphysema (HCC) 10/27/2021   CKD (chronic kidney disease), stage IV (HCC) 07/13/2023   Complication of anesthesia 03/06/2022   Prolonged metabolism of Rocuronium during case.   Diabetes mellitus without complication (HCC)    Diffuse cystic mastopathy    Erosive esophagitis    Essential hypertension, benign 09/24/2017   Family history of malignant neoplasm of gastrointestinal tract 2012   Fractured elbow 08/28/2014   Gastroesophageal reflux disease    Hx of metabolic acidosis with increased anion gap    Increased heart rate    Multiple sclerosis (HCC)    Neuromuscular disorder (HCC)    neuropathy in bil feet - left is worse.   Obesity, unspecified    Peripheral neuropathy    Personal history of tobacco use, presenting hazards to  health    Restless leg syndrome    Sleep apnea    uses CPAP   Special screening for malignant neoplasms, colon    Squamous cell carcinoma of skin 08/24/2022   SCCIS - scalp, ED&C   Past Surgical History:  Procedure Laterality Date   BREAST BIOPSY Right 1973,2001   BREAST BIOPSY Right 11/07/2013   INVASIVE MAMMARY CARCINOMA , ER/PR positive Her2 negative   BREAST BIOPSY Left 11/27/2013   invasive lobular and DCIS   BREAST SURGERY Bilateral 01/09/2014   mastectomy   cataract surgery Left 08/13/2022   CHOLECYSTECTOMY     COLONOSCOPY  2010   Dr. Evette Cristal   COLONOSCOPY WITH PROPOFOL N/A 06/11/2015   Procedure: COLONOSCOPY WITH PROPOFOL;  Surgeon: Kieth Brightly, MD;  Location: [facility] ENDOSCOPY;  Service: Endoscopy;  Laterality: N/A;   COLONOSCOPY WITH PROPOFOL N/A 12/23/2021   Procedure: COLONOSCOPY WITH  PROPOFOL;  Surgeon: Toney Reil, MD;  Location: [facility] ENDOSCOPY;  Service: Gastroenterology;  Laterality: N/A;   DILATATION & CURETTAGE/HYSTEROSCOPY WITH MYOSURE N/A 07/12/2018   Procedure: DILATATION & CURETTAGE/HYSTEROSCOPY WITH MYOSURE WITH ENDOMETRIAL POLYPECTOMY;  Surgeon: Conard Novak, MD;  Location: [facility] ORS;  Service: Gynecology;  Laterality: N/A;   DILATION AND CURETTAGE OF UTERUS  1983   ESOPHAGOGASTRODUODENOSCOPY N/A 12/23/2021   Procedure: ESOPHAGOGASTRODUODENOSCOPY (EGD);  Surgeon: Toney Reil, MD;  Location: [facility] ENDOSCOPY;  Service: Gastroenterology;  Laterality: N/A;   JOINT REPLACEMENT  11/23/2015   KNEE ARTHROPLASTY Right 03/06/2022   Procedure: COMPUTER ASSISTED TOTAL KNEE ARTHROPLASTY;  Surgeon: Donato Heinz, MD;  Location: [facility] ORS;  Service: Orthopedics;  Laterality: Right;   POLYPECTOMY  1989   PORTA CATH REMOVAL     PORTACATH PLACEMENT  2015   SKIN BIOPSY     on scalp   SPINE SURGERY  09/14/2014   Ruptured disk L4- L5   TOTAL KNEE ARTHROPLASTY Left 11/22/2015   Procedure: TOTAL KNEE ARTHROPLASTY;  Surgeon: Frederico Hamman, MD;   Location: [facility] OR;  Service: Orthopedics;  Laterality: Left;   TUBAL LIGATION     WISDOM TOOTH EXTRACTION  2006   Family History  Problem Relation Age of Onset   Cancer Mother        lung   COPD Mother    Cancer Father        colon, stomach   Diabetes Maternal Grandmother    Hypertension Sister    Rectal cancer Paternal Uncle    Rectal cancer Paternal Uncle    Social History   Socioeconomic History   Marital status: Widowed    Spouse name: Not on file   Number of children: Not on file   Years of education: some college   Highest education level: 12th grade  Occupational History   Occupation: retired  Tobacco Use   Smoking status: Former    Current packs/day: 0.00    Average packs/day: 1 pack/day for 30.0 years (30.0 ttl pk-yrs)    Types: Cigarettes    Start date: 12/12/1983    Quit date: 12/11/2013    Years since quitting: 9.5   Smokeless tobacco: Former  Building services engineer status: Never Used  Substance and Sexual Activity   Alcohol use: Not Currently   Drug use: No   Sexual activity: Not Currently    Birth control/protection: Post-menopausal  Other Topics Concern   Not on file  Social History Narrative   Right handed   Drinks caffeine   One story home   Social Determinants of Health   Financial Resource Strain: Low Risk  (07/15/2023)   Overall Financial Resource Strain (CARDIA)    Difficulty of Paying Living Expenses: Not hard at all  Food Insecurity: No Food Insecurity (07/15/2023)   Hunger Vital Sign    Worried About Running Out of Food in the Last Year: Never true    Ran Out of Food in the Last Year: Never true  Transportation Needs: No Transportation Needs (07/15/2023)   PRAPARE - Transportation    Lack of Transportation (Medical): No    Lack of Transportation (Non-Medical): No  Physical Activity: Insufficiently Active (07/15/2023)   Exercise Vital Sign    Days of Exercise per Week: 5 days    Minutes of Exercise per Session: 20 min  Stress: No Stress  Concern Present (07/15/2023)   [employer] of Occupational Health - Occupational Stress Questionnaire    Feeling of Stress : Only  a little  Social Connections: Moderately Isolated (07/15/2023)   Social Connection and Isolation Panel [NHANES]    Frequency of Communication with Friends and Family: Three times a week    Frequency of Social Gatherings with Friends and Family: Twice a week    Attends Religious Services: More than 4 times per year    Active Member of [employer] or Organizations: No    Attends Banker Meetings: Never    Marital Status: Widowed    Tobacco Counseling Counseling given: Not Answered   Clinical Intake:  Pre-visit preparation completed: Yes  Pain : 0-10 Pain Score: 4  Pain Type: Chronic pain Pain Location: Knee Pain Orientation: Left Pain Descriptors / Indicators: Burning, Aching, Constant Pain Onset: More than a month ago Pain Frequency: Constant     Nutritional Risks: None Diabetes: Yes CBG done?: No Did pt. bring in CBG monitor from home?: No  How often do you need to have someone help you when you read instructions, pamphlets, or other written materials from your doctor or pharmacy?: 1 - Never  Interpreter Needed?: No  Information entered by :: Kennedy Bucker, LPN   Activities of Daily Living    07/15/2023    8:36 AM 07/11/2023    9:20 AM  In your present state of health, do you have any difficulty performing the following activities:  Hearing? 0 0  Vision? 0 0  Difficulty concentrating or making decisions? 0 1  Walking or climbing stairs? 1 1  Comment knee   Dressing or bathing? 0 0  Doing errands, shopping? 0 0  Preparing Food and eating ? N N  Using the Toilet? N N  In the past six months, have you accidently leaked urine? N N  Do you have problems with loss of bowel control? N N  Managing your Medications? N N  Managing your Finances? N N  Housekeeping or managing your Housekeeping? N Y    Patient Care  Team: Smitty Cords, DO as PCP - General (Family Medicine) Drema Dallas, DO as Consulting Physician (Neurology) Jeralyn Ruths, MD as Consulting Physician (Oncology) Benita Gutter, RN as Oncology Nurse Navigator  Indicate any recent Medical Services you may have received from other than [facility] providers in the past year (date may be approximate).     Assessment:   This is a routine wellness examination for Cassidy Norris.  Hearing/Vision screen Hearing Screening - Comments:: No aids Vision Screening - Comments:: Wears glasses- Dr. Clydene Pugh  Dietary issues and exercise activities discussed:     Goals Addressed             This Visit's Progress    DIET - EAT MORE FRUITS AND VEGETABLES         Depression Screen    07/15/2023    8:33 AM 07/13/2023    8:40 AM 04/30/2023    8:22 AM 03/19/2023   11:47 AM 10/29/2022   10:12 AM 05/21/2022   11:26 AM 04/28/2022    8:53 AM  PHQ 2/9 Scores  PHQ - 2 Score 0 0 1 0 0 0 0  PHQ- 9 Score 0 2 6 8 3 3 4     Fall Risk    07/15/2023    8:35 AM 07/13/2023    8:40 AM 07/11/2023    9:20 AM 04/30/2023    8:23 AM 12/25/2022    9:26 AM  Fall Risk   Falls in the past year? 0 0 0 1 1  Number falls in  past yr: 0 0  1 0  Injury with Fall? 0 0 0 0 0  Risk for fall due to : No Fall Risks No Fall Risks  No Fall Risks   Follow up Falls prevention discussed;Falls evaluation completed Falls evaluation completed  Falls evaluation completed Falls evaluation completed    MEDICARE RISK AT HOME: Medicare Risk at Home Any stairs in or around the home?: Yes If so, are there any without handrails?: No Home free of loose throw rugs in walkways, pet beds, electrical cords, etc?: Yes Adequate lighting in your home to reduce risk of falls?: Yes Life alert?: No Use of a cane, walker or w/c?: Yes (if doing a lot of walking she uses walker) Grab bars in the bathroom?: No Shower chair or bench in shower?: Yes Elevated toilet seat or a handicapped toilet?:  Yes  TIMED UP AND GO:  Was the test performed?  No    Cognitive Function:    02/19/2015    4:18 PM  MMSE - Mini Mental State Exam  Orientation to time 5  Orientation to Place 5  Registration 3  Attention/ Calculation 4  Recall 3  Language- name 2 objects 2  Language- repeat 1  Language- follow 3 step command 3  Language- read & follow direction 1  Write a sentence 1  Copy design 1  Total score 29        07/15/2023    8:37 AM 05/09/2019    8:52 AM  6CIT Screen  What Year? 0 points 0 points  What month? 0 points 0 points  What time? 0 points 0 points  Count back from 20 0 points 0 points  Months in reverse 0 points 0 points  Repeat phrase 0 points 0 points  Total Score 0 points 0 points    Immunizations Immunization History  Administered Date(s) Administered   Influenza-Unspecified 08/12/2016, 08/25/2017   Tdap 07/22/2015    TDAP status: Up to date  Flu Vaccine status: Declined, Education has been provided regarding the importance of this vaccine but patient still declined. Advised may receive this vaccine at local pharmacy or Health Dept. Aware to provide a copy of the vaccination record if obtained from local pharmacy or Health Dept. Verbalized acceptance and understanding.  Pneumococcal vaccine status: Declined,  Education has been provided regarding the importance of this vaccine but patient still declined. Advised may receive this vaccine at local pharmacy or Health Dept. Aware to provide a copy of the vaccination record if obtained from local pharmacy or Health Dept. Verbalized acceptance and understanding.   Covid-19 vaccine status: Declined, Education has been provided regarding the importance of this vaccine but patient still declined. Advised may receive this vaccine at local pharmacy or Health Dept.or vaccine clinic. Aware to provide a copy of the vaccination record if obtained from local pharmacy or Health Dept. Verbalized acceptance and  understanding.  Qualifies for Shingles Vaccine? Yes   Zostavax completed No   Shingrix Completed?: No.    Education has been provided regarding the importance of this vaccine. Patient has been advised to call insurance company to determine out of pocket expense if they have not yet received this vaccine. Advised may also receive vaccine at local pharmacy or Health Dept. Verbalized acceptance and understanding.  Screening Tests Health Maintenance  Topic Date Due   COVID-19 Vaccine (1) Never done   Zoster Vaccines- Shingrix (1 of 2) Never done   Lung Cancer Screening  12/03/2022   INFLUENZA VACCINE  06/24/2023  OPHTHALMOLOGY EXAM  07/04/2023   Pneumonia Vaccine 62+ Years old (1 of 2 - PCV) 10/30/2023 (Originally 09/10/1957)   Diabetic kidney evaluation - Urine ACR  10/30/2023   FOOT EXAM  10/30/2023   HEMOGLOBIN A1C  10/30/2023   Diabetic kidney evaluation - eGFR measurement  07/07/2024   Medicare Annual Wellness (AWV)  07/14/2024   DTaP/Tdap/Td (2 - Td or Tdap) 07/21/2025   Colonoscopy  12/23/2026   DEXA SCAN  Completed   Hepatitis C Screening  Completed   HPV VACCINES  Aged Out    Health Maintenance  Health Maintenance Due  Topic Date Due   COVID-19 Vaccine (1) Never done   Zoster Vaccines- Shingrix (1 of 2) Never done   Lung Cancer Screening  12/03/2022   INFLUENZA VACCINE  06/24/2023   OPHTHALMOLOGY EXAM  07/04/2023    Colorectal cancer screening: Type of screening: Colonoscopy. Completed 12/23/21. Repeat every 5 years  Mammogram status: No longer required due to double mastectomy.  Bone Density status: Completed 11/30/18. Results reflect: Bone density results: OSTEOPENIA. Repeat every 5 years.  Lung Cancer Screening: (Low Dose CT Chest recommended if Age 41-80 years, 20 pack-year currently smoking OR have quit w/in 15years.) does qualify.   Lung Cancer Screening Referral: 12/03/21, had PET scan in June  Additional Screening:  Hepatitis C Screening: does qualify;  Completed 09/23/15  Vision Screening: Recommended annual ophthalmology exams for early detection of glaucoma and other disorders of the eye. Is the patient up to date with their annual eye exam?  Yes  Who is the provider or what is the name of the office in which the patient attends annual eye exams? Dr.Woodard If pt is not established with a provider, would they like to be referred to a provider to establish care? No .   Dental Screening: Recommended annual dental exams for proper oral hygiene  Diabetic Foot Exam: Diabetic Foot Exam: Completed 10/29/22  Community Resource Referral / Chronic Care Management: CRR required this visit?  No   CCM required this visit?  No     Plan:     I have personally reviewed and noted the following in the patient's chart:   Medical and social history Use of alcohol, tobacco or illicit drugs  Current medications and supplements including opioid prescriptions. Patient is not currently taking opioid prescriptions. Functional ability and status Nutritional status Physical activity Advanced directives List of other physicians Hospitalizations, surgeries, and ER visits in previous 12 months Vitals Screenings to include cognitive, depression, and falls Referrals and appointments  In addition, I have reviewed and discussed with patient certain preventive protocols, quality metrics, and best practice recommendations. A written personalized care plan for preventive services as well as general preventive health recommendations were provided to patient.     Hal Hope, LPN   [id number]/61/0960   After Visit Summary: (MyChart) Due to this being a telephonic visit, the after visit summary with patients personalized plan was offered to patient via MyChart   Nurse Notes: none

## 2023-07-20 ENCOUNTER — Ambulatory Visit (INDEPENDENT_AMBULATORY_CARE_PROVIDER_SITE_OTHER): Payer: Medicare Other | Admitting: Dermatology

## 2023-07-20 VITALS — BP 125/79 | HR 111

## 2023-07-20 DIAGNOSIS — D485 Neoplasm of uncertain behavior of skin: Secondary | ICD-10-CM

## 2023-07-20 DIAGNOSIS — W908XXA Exposure to other nonionizing radiation, initial encounter: Secondary | ICD-10-CM

## 2023-07-20 DIAGNOSIS — L814 Other melanin hyperpigmentation: Secondary | ICD-10-CM

## 2023-07-20 DIAGNOSIS — Z8589 Personal history of malignant neoplasm of other organs and systems: Secondary | ICD-10-CM

## 2023-07-20 DIAGNOSIS — L578 Other skin changes due to chronic exposure to nonionizing radiation: Secondary | ICD-10-CM | POA: Diagnosis not present

## 2023-07-20 DIAGNOSIS — Z1283 Encounter for screening for malignant neoplasm of skin: Secondary | ICD-10-CM | POA: Diagnosis not present

## 2023-07-20 DIAGNOSIS — D044 Carcinoma in situ of skin of scalp and neck: Secondary | ICD-10-CM

## 2023-07-20 DIAGNOSIS — D1801 Hemangioma of skin and subcutaneous tissue: Secondary | ICD-10-CM

## 2023-07-20 DIAGNOSIS — L570 Actinic keratosis: Secondary | ICD-10-CM | POA: Diagnosis not present

## 2023-07-20 DIAGNOSIS — L820 Inflamed seborrheic keratosis: Secondary | ICD-10-CM | POA: Diagnosis not present

## 2023-07-20 DIAGNOSIS — L821 Other seborrheic keratosis: Secondary | ICD-10-CM

## 2023-07-20 NOTE — Patient Instructions (Addendum)

## 2023-07-20 NOTE — Progress Notes (Signed)
Follow-Up Visit   Subjective  Cassidy Norris is a 72 y.o. female who presents for the following: Skin Cancer Screening and Full Body Skin Exam The patient presents for Total-Body Skin Exam (TBSE) for skin cancer screening and mole check. The patient has spots, moles and lesions to be evaluated, some may be new or changing and the patient may have concern these could be cancer.  the following portions of the chart were reviewed this encounter and updated as appropriate: medications, allergies, medical history  Review of Systems:  No other skin or systemic complaints except as noted in HPI or Assessment and Plan.  Objective  Well appearing patient in no apparent distress; mood and affect are within normal limits.  A full examination was performed including scalp, head, eyes, ears, nose, lips, neck, chest, axillae, abdomen, back, buttocks, bilateral upper extremities, bilateral lower extremities, hands, feet, fingers, toes, fingernails, and toenails. All findings within normal limits unless otherwise noted below.   Relevant physical exam findings are noted in the Assessment and Plan.  R of midline ant scalp post 3.2 cm hyperkeratotic papule.      R of midline ant scalp ant 2.2 cm hyperkeratotic papule.   Scalp x 3 (3) Erythematous thin papules/macules with gritty scale.   Face and hands x 16 (16) Erythematous stuck-on, waxy papule or plaque    Assessment & Plan   SKIN CANCER SCREENING PERFORMED TODAY.  ACTINIC DAMAGE - Chronic condition, secondary to cumulative UV/sun exposure - diffuse scaly erythematous macules with underlying dyspigmentation - Recommend daily broad spectrum sunscreen SPF 30+ to sun-exposed areas, reapply every 2 hours as needed.  - Staying in the shade or wearing long sleeves, sun glasses (UVA+UVB protection) and wide brim hats (4-inch brim around the entire circumference of the hat) are also recommended for sun protection.  - Call for new or  changing lesions.  LENTIGINES, SEBORRHEIC KERATOSES, HEMANGIOMAS - Benign normal skin lesions - Benign-appearing - Call for any changes  MELANOCYTIC NEVI - Tan-brown and/or pink-flesh-colored symmetric macules and papules - Benign appearing on exam today - Observation - Call clinic for new or changing moles - Recommend daily use of broad spectrum spf 30+ sunscreen to sun-exposed areas.   Neoplasm of uncertain behavior of skin (2) R of midline ant scalp post  Epidermal / dermal shaving  Lesion diameter (cm):  3.2 Informed consent: discussed and consent obtained   Timeout: patient name, date of birth, surgical site, and procedure verified   Procedure prep:  Patient was prepped and draped in usual sterile fashion Prep type:  Isopropyl alcohol Anesthesia: the lesion was anesthetized in a standard fashion   Anesthetic:  1% lidocaine w/ epinephrine 1-100,000 buffered w/ 8.4% NaHCO3 Instrument used: flexible razor blade   Hemostasis achieved with: pressure, aluminum chloride and electrodesiccation   Outcome: patient tolerated procedure well   Post-procedure details: sterile dressing applied and wound care instructions given   Dressing type: bandage and petrolatum    Destruction of lesion Complexity: extensive   Destruction method: electrodesiccation and curettage   Informed consent: discussed and consent obtained   Timeout:  patient name, date of birth, surgical site, and procedure verified Procedure prep:  Patient was prepped and draped in usual sterile fashion Prep type:  Isopropyl alcohol Anesthesia: the lesion was anesthetized in a standard fashion   Anesthetic:  1% lidocaine w/ epinephrine 1-100,000 buffered w/ 8.4% NaHCO3 Curettage performed in three different directions: Yes   Electrodesiccation performed over the curetted area: Yes  Final wound size (cm):  3.2 Hemostasis achieved with:  pressure, aluminum chloride and electrodesiccation Outcome: patient tolerated  procedure well with no complications   Post-procedure details: sterile dressing applied and wound care instructions given   Dressing type: bandage and petrolatum    Specimen 1 - Surgical pathology Differential Diagnosis: D48.5 r/o recurrent SCC [id number] Check Margins: No  R of midline ant scalp ant  Epidermal / dermal shaving  Lesion diameter (cm):  2.2 Informed consent: discussed and consent obtained   Timeout: patient name, date of birth, surgical site, and procedure verified   Procedure prep:  Patient was prepped and draped in usual sterile fashion Prep type:  Isopropyl alcohol Anesthesia: the lesion was anesthetized in a standard fashion   Anesthetic:  1% lidocaine w/ epinephrine 1-100,000 buffered w/ 8.4% NaHCO3 Instrument used: flexible razor blade   Hemostasis achieved with: pressure, aluminum chloride and electrodesiccation   Outcome: patient tolerated procedure well   Post-procedure details: sterile dressing applied and wound care instructions given   Dressing type: bandage and petrolatum    Destruction of lesion Complexity: extensive   Destruction method: electrodesiccation and curettage   Informed consent: discussed and consent obtained   Timeout:  patient name, date of birth, surgical site, and procedure verified Procedure prep:  Patient was prepped and draped in usual sterile fashion Prep type:  Isopropyl alcohol Anesthesia: the lesion was anesthetized in a standard fashion   Anesthetic:  1% lidocaine w/ epinephrine 1-100,000 buffered w/ 8.4% NaHCO3 Curettage performed in three different directions: Yes   Electrodesiccation performed over the curetted area: Yes   Final wound size (cm):  2.2 Hemostasis achieved with:  pressure, aluminum chloride and electrodesiccation Outcome: patient tolerated procedure well with no complications   Post-procedure details: sterile dressing applied and wound care instructions given   Dressing type: bandage and petrolatum     Specimen 2 - Surgical pathology Differential Diagnosis: D48.5 r/o recurrent SCC [id number] Check Margins: No  AK (actinic keratosis) (3) Scalp x 3  Actinic keratoses are precancerous spots that appear secondary to cumulative UV radiation exposure/sun exposure over time. They are chronic with expected duration over 1 year. A portion of actinic keratoses will progress to squamous cell carcinoma of the skin. It is not possible to reliably predict which spots will progress to skin cancer and so treatment is recommended to prevent development of skin cancer.  Recommend daily broad spectrum sunscreen SPF 30+ to sun-exposed areas, reapply every 2 hours as needed.  Recommend staying in the shade or wearing long sleeves, sun glasses (UVA+UVB protection) and wide brim hats (4-inch brim around the entire circumference of the hat). Call for new or changing lesions.   Destruction of lesion - Scalp x 3 (3) Complexity: simple   Destruction method: cryotherapy   Informed consent: discussed and consent obtained   Timeout:  patient name, date of birth, surgical site, and procedure verified Lesion destroyed using liquid nitrogen: Yes   Region frozen until ice ball extended beyond lesion: Yes   Outcome: patient tolerated procedure well with no complications   Post-procedure details: wound care instructions given    Inflamed seborrheic keratosis (16) Face and hands x 16  Symptomatic, irritating, patient would like treated.   Destruction of lesion - Face and hands x 16 (16) Complexity: simple   Destruction method: cryotherapy   Informed consent: discussed and consent obtained   Timeout:  patient name, date of birth, surgical site, and procedure verified Lesion destroyed using liquid nitrogen: Yes  Region frozen until ice ball extended beyond lesion: Yes   Outcome: patient tolerated procedure well with no complications   Post-procedure details: wound care instructions given     Return in  about 6 months (around 01/20/2024) for recheck AKs, ISKs, SCC site.  Maylene Roes, CMA, am acting as scribe for Armida Sans, MD .  Documentation: I have reviewed the above documentation for accuracy and completeness, and I agree with the above.  Armida Sans, MD

## 2023-07-23 ENCOUNTER — Encounter
Admission: RE | Admit: 2023-07-23 | Discharge: 2023-07-23 | Disposition: A | Discharge status: Home / Self Care | Payer: Medicare Other | Source: Ambulatory Visit | Attending: Obstetrics and Gynecology | Admitting: Obstetrics and Gynecology

## 2023-07-23 ENCOUNTER — Other Ambulatory Visit: Payer: Self-pay

## 2023-07-23 ENCOUNTER — Encounter: Payer: Self-pay | Admitting: Dermatology

## 2023-07-23 VITALS — Ht 65.5 in | Wt 183.0 lb

## 2023-07-23 DIAGNOSIS — I10 Essential (primary) hypertension: Secondary | ICD-10-CM

## 2023-07-23 DIAGNOSIS — E1169 Type 2 diabetes mellitus with other specified complication: Secondary | ICD-10-CM

## 2023-07-23 NOTE — Patient Instructions (Signed)
Your procedure is scheduled on: Wednesday 08/04/23 To find out your arrival time, please call [phone] between 1PM - 3PM on:   Tuesday 08/03/23 Report to the Registration Desk on the 1st floor of the [facility]. FREE Valet parking is available.  If your arrival time is 6:00 am, do not arrive before that time as the [facility] entrance doors do not open until 6:00 am.  REMEMBER: Instructions that are not followed completely may result in serious medical risk, up to and including death; or upon the discretion of your surgeon and anesthesiologist your surgery may need to be rescheduled.  Do not eat food or drink any liquids after midnight the night before surgery.  No gum chewing or hard candies.  One week prior to surgery: Stop Anti-inflammatories (NSAIDS) such as Advil, Aleve, Ibuprofen, Motrin, Naproxen, Naprosyn and Aspirin based products such as Excedrin, Goody's Powder, BC Powder. You may however, continue to take Tylenol if needed for pain up until the day of surgery.  Stop ANY OVER THE COUNTER supplements until after surgery. Stop your Vitamin D, B12, Calcium and Krill oil last dose Tuesday 07/26/23  Continue taking all prescribed medications with the exception of the following: Farxiga, last dose Saturday 07/31/23. Metformin, last dose Sunday 08/01/23  TAKE ONLY THESE MEDICATIONS THE MORNING OF SURGERY WITH A SIP OF WATER:  omeprazole (PRILOSEC) 40 MG capsule Antacid (take one the night before and one on the morning of surgery - helps to prevent nausea after surgery.) venlafaxine XR (EFFEXOR-XR) 75 MG 24 hr capsule   No Alcohol for 24 hours before or after surgery.  No Smoking including e-cigarettes for 24 hours before surgery.  No chewable tobacco products for at least 6 hours before surgery.  No nicotine patches on the day of surgery.  Do not use any "recreational" drugs for at least a week (preferably 2 weeks) before your surgery.  Please be advised that the combination of  cocaine and anesthesia may have negative outcomes, up to and including death. If you test positive for cocaine, your surgery will be cancelled.  On the morning of surgery brush your teeth with toothpaste and water, you may rinse your mouth with mouthwash if you wish. Do not swallow any toothpaste or mouthwash.  Use CHG Soap or wipes as directed on instruction sheet.  Do not wear lotions, powders, or perfumes.   Do not shave body hair from the neck down 48 hours before surgery.  Wear comfortable clothing (specific to your surgery type) to the hospital.  Do not wear jewelry, make-up, hairpins, clips or nail polish.  Contact lenses, hearing aids and dentures may not be worn into surgery.  Bring your C-PAP to the hospital in case you may have to spend the night.   Do not bring valuables to the hospital. [facility] is not responsible for any missing/lost belongings or valuables.   Notify your doctor if there is any change in your medical condition (cold, fever, infection).  If you are being discharged the day of surgery, you will not be allowed to drive home. You will need a responsible individual to drive you home and stay with you for 24 hours after surgery.   If you are taking public transportation, you will need to have a responsible individual with you.  If you are being admitted to the hospital overnight, leave your suitcase in the car. After surgery it may be brought to your room.  In case of increased patient census, it may be  necessary for you, the patient, to continue your postoperative care in the Same Day Surgery department.  After surgery, you can help prevent lung complications by doing breathing exercises.  Take deep breaths and cough every 1-2 hours. Your doctor may order a device called an Incentive Spirometer to help you take deep breaths. When coughing or sneezing, hold a pillow firmly against your incision with both hands. This is called "splinting." Doing this  helps protect your incision. It also decreases belly discomfort.  Surgery Visitation Policy:  Patients undergoing a surgery or procedure may have two family members or support persons with them as long as the person is not COVID-19 positive or experiencing its symptoms.   Inpatient Visitation:    Visiting hours are 7 a.m. to 8 p.m. Up to four visitors are allowed at one time in a patient room. The visitors may rotate out with other people during the day. One designated support person (adult) may remain overnight.  Please call the Pre-admissions Testing Dept. at [phone] if you have any questions about these instructions.     Preparing for Surgery with CHLORHEXIDINE GLUCONATE (CHG) Soap  Chlorhexidine Gluconate (CHG) Soap  o An antiseptic cleaner that kills germs and bonds with the skin to continue killing germs even after washing  o Used for showering the night before surgery and morning of surgery  Before surgery, you can play an important role by reducing the number of germs on your skin.  CHG (Chlorhexidine gluconate) soap is an antiseptic cleanser which kills germs and bonds with the skin to continue killing germs even after washing.  Please do not use if you have an allergy to CHG or antibacterial soaps. If your skin becomes reddened/irritated stop using the CHG.  1. Shower the NIGHT BEFORE SURGERY and the MORNING OF SURGERY with CHG soap.  2. If you choose to wash your hair, wash your hair first as usual with your normal shampoo.  3. After shampooing, rinse your hair and body thoroughly to remove the shampoo.  4. Use CHG as you would any other liquid soap. You can apply CHG directly to the skin and wash gently with a scrungie or a clean washcloth.  5. Apply the CHG soap to your body only from the neck down. Do not use on open wounds or open sores. Avoid contact with your eyes, ears, mouth, and genitals (private parts). Wash face and genitals (private parts) with your  normal soap.  6. Wash thoroughly, paying special attention to the area where your surgery will be performed.  7. Thoroughly rinse your body with warm water.  8. Do not shower/wash with your normal soap after using and rinsing off the CHG soap.  9. Pat yourself dry with a clean towel.  10. Wear clean pajamas to bed the night before surgery.  12. Place clean sheets on your bed the night of your first shower and do not sleep with pets.  13. Shower again with the CHG soap on the day of surgery prior to arriving at the hospital.  14. Do not apply any deodorants/lotions/powders.  15. Please wear clean clothes to the hospital.

## 2023-07-27 ENCOUNTER — Telehealth: Payer: Self-pay

## 2023-07-27 ENCOUNTER — Encounter
Admission: RE | Admit: 2023-07-27 | Discharge: 2023-07-27 | Disposition: A | Discharge status: Home / Self Care | Payer: Medicare Other | Source: Ambulatory Visit | Attending: Obstetrics and Gynecology | Admitting: Obstetrics and Gynecology

## 2023-07-27 ENCOUNTER — Encounter: Payer: Self-pay | Admitting: Urgent Care

## 2023-07-27 DIAGNOSIS — Z01818 Encounter for other preprocedural examination: Secondary | ICD-10-CM | POA: Diagnosis present

## 2023-07-27 DIAGNOSIS — I10 Essential (primary) hypertension: Secondary | ICD-10-CM | POA: Insufficient documentation

## 2023-07-27 DIAGNOSIS — E1169 Type 2 diabetes mellitus with other specified complication: Secondary | ICD-10-CM | POA: Insufficient documentation

## 2023-07-27 DIAGNOSIS — N838 Other noninflammatory disorders of ovary, fallopian tube and broad ligament: Secondary | ICD-10-CM | POA: Diagnosis not present

## 2023-07-27 DIAGNOSIS — E785 Hyperlipidemia, unspecified: Secondary | ICD-10-CM | POA: Insufficient documentation

## 2023-07-27 LAB — URINALYSIS, ROUTINE W REFLEX MICROSCOPIC
Bilirubin Urine: NEGATIVE
Glucose, UA: >1000 mg/dL — AB
Hgb urine dipstick: NEGATIVE
Ketones, ur: NEGATIVE mg/dL
Leukocytes,Ua: NEGATIVE
Nitrite: NEGATIVE
Protein, ur: NEGATIVE mg/dL
Specific Gravity, Urine: 1.015 (ref 1.005–1.030)
pH: 5 (ref 5.0–8.0)

## 2023-07-27 NOTE — Telephone Encounter (Signed)
-----   Message from Armida Sans sent at 07/22/2023  4:40 PM EDT ----- Diagnosis 1. Skin (M), right of midline ant scalp post SQUAMOUS CELL CARCINOMA IN SITU, HYPERTROPHIC 2. Skin (M), right of midline ant scalp ant SQUAMOUS CELL CARCINOMA IN SITU, HYPERTROPHIC  1&2 -both cancer = SCCIS Superficial Already treated Recheck next visit May consider topical chemotherapy at that visit

## 2023-07-27 NOTE — Telephone Encounter (Signed)
Patient informed of pathology results 

## 2023-08-03 MED ORDER — SODIUM CHLORIDE 0.9 % IV SOLN: INTRAVENOUS | Status: DC

## 2023-08-03 MED ORDER — CHLORHEXIDINE GLUCONATE 0.12 % MT SOLN
15.0000 mL | Freq: Once | OROMUCOSAL | Status: AC
  Administered 2023-08-04: 15 mL via OROMUCOSAL

## 2023-08-03 MED ORDER — HEPARIN SODIUM (PORCINE) 5000 UNIT/ML IJ SOLN
5000.0000 [IU] | Freq: Once | INTRAMUSCULAR | Status: AC
  Administered 2023-08-04: 5000 [IU] via SUBCUTANEOUS

## 2023-08-03 MED ORDER — ORAL CARE MOUTH RINSE: 15.0000 mL | Freq: Once | OROMUCOSAL | Status: AC

## 2023-08-03 MED ORDER — CHLORHEXIDINE GLUCONATE CLOTH 2 % EX PADS
6.0000 | MEDICATED_PAD | Freq: Once | CUTANEOUS | Status: AC
  Administered 2023-08-04: 6 via TOPICAL

## 2023-08-03 MED ORDER — CHLORHEXIDINE GLUCONATE CLOTH 2 % EX PADS: 6.0000 | MEDICATED_PAD | Freq: Once | CUTANEOUS | Status: DC

## 2023-08-04 ENCOUNTER — Ambulatory Visit: Payer: Medicare Other | Admitting: Anesthesiology

## 2023-08-04 ENCOUNTER — Encounter
Admission: RE | Disposition: A | Discharge status: Home / Self Care | Payer: Self-pay | Source: Ambulatory Visit | Attending: Obstetrics and Gynecology

## 2023-08-04 ENCOUNTER — Encounter: Payer: Self-pay | Admitting: Obstetrics and Gynecology

## 2023-08-04 ENCOUNTER — Other Ambulatory Visit: Payer: Self-pay

## 2023-08-04 ENCOUNTER — Ambulatory Visit
Admission: RE | Admit: 2023-08-04 | Discharge: 2023-08-04 | Disposition: A | Discharge status: Home / Self Care | Payer: Medicare Other | Source: Ambulatory Visit | Attending: Obstetrics and Gynecology | Admitting: Obstetrics and Gynecology

## 2023-08-04 DIAGNOSIS — N838 Other noninflammatory disorders of ovary, fallopian tube and broad ligament: Secondary | ICD-10-CM | POA: Diagnosis present

## 2023-08-04 DIAGNOSIS — Z8711 Personal history of peptic ulcer disease: Secondary | ICD-10-CM | POA: Diagnosis not present

## 2023-08-04 DIAGNOSIS — N189 Chronic kidney disease, unspecified: Secondary | ICD-10-CM | POA: Diagnosis not present

## 2023-08-04 DIAGNOSIS — Z7984 Long term (current) use of oral hypoglycemic drugs: Secondary | ICD-10-CM | POA: Insufficient documentation

## 2023-08-04 DIAGNOSIS — J449 Chronic obstructive pulmonary disease, unspecified: Secondary | ICD-10-CM | POA: Insufficient documentation

## 2023-08-04 DIAGNOSIS — G709 Myoneural disorder, unspecified: Secondary | ICD-10-CM | POA: Insufficient documentation

## 2023-08-04 DIAGNOSIS — Z87891 Personal history of nicotine dependence: Secondary | ICD-10-CM | POA: Insufficient documentation

## 2023-08-04 DIAGNOSIS — I700 Atherosclerosis of aorta: Secondary | ICD-10-CM | POA: Diagnosis not present

## 2023-08-04 DIAGNOSIS — Z9221 Personal history of antineoplastic chemotherapy: Secondary | ICD-10-CM | POA: Diagnosis not present

## 2023-08-04 DIAGNOSIS — I129 Hypertensive chronic kidney disease with stage 1 through stage 4 chronic kidney disease, or unspecified chronic kidney disease: Secondary | ICD-10-CM | POA: Insufficient documentation

## 2023-08-04 DIAGNOSIS — K219 Gastro-esophageal reflux disease without esophagitis: Secondary | ICD-10-CM | POA: Insufficient documentation

## 2023-08-04 DIAGNOSIS — C50919 Malignant neoplasm of unspecified site of unspecified female breast: Secondary | ICD-10-CM | POA: Insufficient documentation

## 2023-08-04 DIAGNOSIS — L659 Nonscarring hair loss, unspecified: Secondary | ICD-10-CM | POA: Insufficient documentation

## 2023-08-04 DIAGNOSIS — G4730 Sleep apnea, unspecified: Secondary | ICD-10-CM | POA: Insufficient documentation

## 2023-08-04 DIAGNOSIS — C7962 Secondary malignant neoplasm of left ovary: Secondary | ICD-10-CM | POA: Insufficient documentation

## 2023-08-04 DIAGNOSIS — N184 Chronic kidney disease, stage 4 (severe): Secondary | ICD-10-CM | POA: Diagnosis not present

## 2023-08-04 DIAGNOSIS — C7982 Secondary malignant neoplasm of genital organs: Secondary | ICD-10-CM | POA: Diagnosis not present

## 2023-08-04 DIAGNOSIS — E259 Adrenogenital disorder, unspecified: Secondary | ICD-10-CM | POA: Diagnosis not present

## 2023-08-04 DIAGNOSIS — C773 Secondary and unspecified malignant neoplasm of axilla and upper limb lymph nodes: Secondary | ICD-10-CM | POA: Diagnosis not present

## 2023-08-04 DIAGNOSIS — G35 Multiple sclerosis: Secondary | ICD-10-CM | POA: Diagnosis not present

## 2023-08-04 DIAGNOSIS — E1169 Type 2 diabetes mellitus with other specified complication: Secondary | ICD-10-CM

## 2023-08-04 DIAGNOSIS — E1122 Type 2 diabetes mellitus with diabetic chronic kidney disease: Secondary | ICD-10-CM | POA: Insufficient documentation

## 2023-08-04 DIAGNOSIS — C7961 Secondary malignant neoplasm of right ovary: Secondary | ICD-10-CM | POA: Diagnosis not present

## 2023-08-04 DIAGNOSIS — D3912 Neoplasm of uncertain behavior of left ovary: Secondary | ICD-10-CM

## 2023-08-04 DIAGNOSIS — N839 Noninflammatory disorder of ovary, fallopian tube and broad ligament, unspecified: Secondary | ICD-10-CM | POA: Diagnosis present

## 2023-08-04 DIAGNOSIS — C7989 Secondary malignant neoplasm of other specified sites: Secondary | ICD-10-CM | POA: Diagnosis not present

## 2023-08-04 HISTORY — PX: LAPAROSCOPIC BILATERAL SALPINGO OOPHERECTOMY: SHX5890

## 2023-08-04 LAB — GLUCOSE, CAPILLARY
Glucose-Capillary: 117 mg/dL — ABNORMAL HIGH (ref 70–99)
Glucose-Capillary: 180 mg/dL — ABNORMAL HIGH (ref 70–99)

## 2023-08-04 SURGERY — SALPINGO-OOPHORECTOMY, BILATERAL, LAPAROSCOPIC
Anesthesia: General | Site: Abdomen

## 2023-08-04 MED ORDER — 0.9 % SODIUM CHLORIDE (POUR BTL) OPTIME
TOPICAL | Status: DC | PRN
  Administered 2023-08-04: 500 mL

## 2023-08-04 MED ORDER — LIDOCAINE HCL (CARDIAC) PF 100 MG/5ML IV SOSY
PREFILLED_SYRINGE | INTRAVENOUS | Status: DC | PRN
  Administered 2023-08-04: 100 mg via INTRAVENOUS

## 2023-08-04 MED ORDER — BUPIVACAINE HCL (PF) 0.5 % IJ SOLN
INTRAMUSCULAR | Status: DC | PRN
  Administered 2023-08-04: 11 mL

## 2023-08-04 MED ORDER — PHENYLEPHRINE HCL-NACL 20-0.9 MG/250ML-% IV SOLN
INTRAVENOUS | Status: DC | PRN
Start: 2023-08-04 — End: 2023-08-04
  Administered 2023-08-04: 15 ug/min via INTRAVENOUS

## 2023-08-04 MED ORDER — OXYCODONE HCL 5 MG PO TABS
5.0000 mg | ORAL_TABLET | Freq: Once | ORAL | Status: AC | PRN
  Administered 2023-08-04: 5 mg via ORAL

## 2023-08-04 MED ORDER — ONDANSETRON HCL 4 MG/2ML IJ SOLN
INTRAMUSCULAR | Status: DC | PRN
  Administered 2023-08-04 (×2): 4 mg via INTRAVENOUS

## 2023-08-04 MED ORDER — BUPIVACAINE HCL (PF) 0.5 % IJ SOLN
INTRAMUSCULAR | Status: AC
  Filled 2023-08-04: qty 30

## 2023-08-04 MED ORDER — PROPOFOL 10 MG/ML IV BOLUS
INTRAVENOUS | Status: AC
  Filled 2023-08-04: qty 20

## 2023-08-04 MED ORDER — PROMETHAZINE HCL 25 MG/ML IJ SOLN: 6.2500 mg | INTRAMUSCULAR | Status: DC | PRN

## 2023-08-04 MED ORDER — METHYLENE BLUE (ANTIDOTE) 1 % IV SOLN
INTRAVENOUS | Status: AC
  Filled 2023-08-04: qty 10

## 2023-08-04 MED ORDER — DEXAMETHASONE SODIUM PHOSPHATE 10 MG/ML IJ SOLN
INTRAMUSCULAR | Status: DC | PRN
  Administered 2023-08-04: 5 mg via INTRAVENOUS

## 2023-08-04 MED ORDER — PROPOFOL 10 MG/ML IV BOLUS
INTRAVENOUS | Status: DC | PRN
  Administered 2023-08-04: 150 mg via INTRAVENOUS

## 2023-08-04 MED ORDER — FENTANYL CITRATE (PF) 100 MCG/2ML IJ SOLN
INTRAMUSCULAR | Status: DC | PRN
  Administered 2023-08-04: 50 ug via INTRAVENOUS

## 2023-08-04 MED ORDER — CHLORHEXIDINE GLUCONATE 0.12 % MT SOLN
OROMUCOSAL | Status: AC
  Filled 2023-08-04: qty 15

## 2023-08-04 MED ORDER — SUCCINYLCHOLINE CHLORIDE 200 MG/10ML IV SOSY
PREFILLED_SYRINGE | INTRAVENOUS | Status: DC | PRN
  Administered 2023-08-04: 100 mg via INTRAVENOUS

## 2023-08-04 MED ORDER — HYDROMORPHONE HCL 1 MG/ML IJ SOLN
INTRAMUSCULAR | Status: AC
  Filled 2023-08-04: qty 1

## 2023-08-04 MED ORDER — OXYCODONE HCL 5 MG/5ML PO SOLN: 5.0000 mg | Freq: Once | ORAL | Status: AC | PRN

## 2023-08-04 MED ORDER — ACETAMINOPHEN 10 MG/ML IV SOLN: 1000.0000 mg | Freq: Once | INTRAVENOUS | Status: DC | PRN

## 2023-08-04 MED ORDER — ROCURONIUM BROMIDE 100 MG/10ML IV SOLN
INTRAVENOUS | Status: DC | PRN
  Administered 2023-08-04: 50 mg via INTRAVENOUS

## 2023-08-04 MED ORDER — MIDAZOLAM HCL 2 MG/2ML IJ SOLN
INTRAMUSCULAR | Status: AC
  Filled 2023-08-04: qty 2

## 2023-08-04 MED ORDER — OXYCODONE HCL 5 MG PO TABS
5.0000 mg | ORAL_TABLET | Freq: Two times a day (BID) | ORAL | 0 refills | Status: DC | PRN
Start: 2023-08-04 — End: 2023-08-12

## 2023-08-04 MED ORDER — FENTANYL CITRATE (PF) 100 MCG/2ML IJ SOLN
INTRAMUSCULAR | Status: AC
  Filled 2023-08-04: qty 2

## 2023-08-04 MED ORDER — OXYCODONE HCL 5 MG PO TABS
ORAL_TABLET | ORAL | Status: AC
  Filled 2023-08-04: qty 1

## 2023-08-04 MED ORDER — ESMOLOL HCL 100 MG/10ML IV SOLN
INTRAVENOUS | Status: DC | PRN
Start: 2023-08-04 — End: 2023-08-04
  Administered 2023-08-04: 20 mg via INTRAVENOUS

## 2023-08-04 MED ORDER — PHENYLEPHRINE 80 MCG/ML (10ML) SYRINGE FOR IV PUSH (FOR BLOOD PRESSURE SUPPORT)
PREFILLED_SYRINGE | INTRAVENOUS | Status: DC | PRN
  Administered 2023-08-04: 160 ug via INTRAVENOUS

## 2023-08-04 MED ORDER — METOPROLOL TARTRATE 5 MG/5ML IV SOLN
INTRAVENOUS | Status: DC | PRN
  Administered 2023-08-04 (×2): 1 mg via INTRAVENOUS

## 2023-08-04 MED ORDER — HYDROMORPHONE HCL 1 MG/ML IJ SOLN
INTRAMUSCULAR | Status: DC | PRN
Start: 2023-08-04 — End: 2023-08-04
  Administered 2023-08-04: .5 mg via INTRAVENOUS

## 2023-08-04 MED ORDER — PHENYLEPHRINE HCL-NACL 20-0.9 MG/250ML-% IV SOLN
INTRAVENOUS | Status: AC
  Filled 2023-08-04: qty 250

## 2023-08-04 MED ORDER — FENTANYL CITRATE (PF) 100 MCG/2ML IJ SOLN: 25.0000 ug | INTRAMUSCULAR | Status: DC | PRN

## 2023-08-04 MED ORDER — HEPARIN SODIUM (PORCINE) 5000 UNIT/ML IJ SOLN
INTRAMUSCULAR | Status: AC
  Filled 2023-08-04: qty 1

## 2023-08-04 MED ORDER — SUGAMMADEX SODIUM 200 MG/2ML IV SOLN
INTRAVENOUS | Status: DC | PRN
  Administered 2023-08-04: 250 mg via INTRAVENOUS

## 2023-08-04 MED ORDER — DROPERIDOL 2.5 MG/ML IJ SOLN: 0.6250 mg | Freq: Once | INTRAMUSCULAR | Status: DC | PRN

## 2023-08-04 SURGICAL SUPPLY — 68 items
ADH SKN CLS APL DERMABOND .7 (GAUZE/BANDAGES/DRESSINGS) ×1
AGENT HMST KT MTR STRL THRMB (HEMOSTASIS)
APL ESCP 34 STRL LF DISP (HEMOSTASIS)
APL PRP STRL LF DISP 70% ISPRP (MISCELLANEOUS) ×1
APPLICATOR SURGIFLO ENDO (HEMOSTASIS) IMPLANT
BAG DRN RND TRDRP ANRFLXCHMBR (UROLOGICAL SUPPLIES) ×1
BAG URINE DRAIN 2000ML AR STRL (UROLOGICAL SUPPLIES) ×1 IMPLANT
BLADE SURG 15 STRL LF DISP TIS (BLADE) ×1 IMPLANT
BLADE SURG 15 STRL SS (BLADE) ×1
CANNULA DILATOR 5 W/SLV (CANNULA) ×1 IMPLANT
CATH FOLEY 2WAY 5CC 16FR (CATHETERS) ×1
CATH URTH 16FR FL 2W BLN LF (CATHETERS) ×1 IMPLANT
CHLORAPREP W/TINT 26 (MISCELLANEOUS) ×1 IMPLANT
CORD MONOPOLAR M/FML 12FT (MISCELLANEOUS) IMPLANT
DEFOGGER SCOPE WARMER CLEARIFY (MISCELLANEOUS) ×1 IMPLANT
DERMABOND ADVANCED .7 DNX12 (GAUZE/BANDAGES/DRESSINGS) ×1 IMPLANT
DEVICE SUTURE ENDOST 10MM (ENDOMECHANICALS) ×1 IMPLANT
DRAPE STERI POUCH LG 24X46 STR (DRAPES) ×2 IMPLANT
GAUZE 4X4 16PLY ~~LOC~~+RFID DBL (SPONGE) ×1 IMPLANT
GLOVE BIO SURGEON STRL SZ8 (GLOVE) ×4 IMPLANT
GLOVE INDICATOR 8.0 STRL GRN (GLOVE) ×1 IMPLANT
GOWN STRL REUS W/ TWL LRG LVL3 (GOWN DISPOSABLE) ×2 IMPLANT
GOWN STRL REUS W/ TWL XL LVL3 (GOWN DISPOSABLE) ×1 IMPLANT
GOWN STRL REUS W/TWL LRG LVL3 (GOWN DISPOSABLE) ×2
GOWN STRL REUS W/TWL XL LVL3 (GOWN DISPOSABLE) ×1
GRASPER SUT TROCAR 14GX15 (MISCELLANEOUS) ×1 IMPLANT
IRRIGATION STRYKERFLOW (MISCELLANEOUS) IMPLANT
IRRIGATOR STRYKERFLOW (MISCELLANEOUS)
IV LACTATED RINGERS 1000ML (IV SOLUTION) ×1 IMPLANT
KIT IMAGING PINPOINTPAQ (MISCELLANEOUS) IMPLANT
KIT PINK PAD W/HEAD ARE REST (MISCELLANEOUS) ×1
KIT PINK PAD W/HEAD ARM REST (MISCELLANEOUS) ×1 IMPLANT
LABEL OR SOLS (LABEL) ×1 IMPLANT
LIGASURE VESSEL 5MM BLUNT TIP (ELECTROSURGICAL) ×1 IMPLANT
MANIFOLD NEPTUNE II (INSTRUMENTS) ×1 IMPLANT
MANIPULATOR VCARE LG CRV RETR (MISCELLANEOUS) IMPLANT
MANIPULATOR VCARE SML CRV RETR (MISCELLANEOUS) IMPLANT
MANIPULATOR VCARE STD CRV RETR (MISCELLANEOUS) IMPLANT
NDL INSUFF ACCESS 14 VERSASTEP (NEEDLE) ×1 IMPLANT
NDL SPNL 22GX5 LNG QUINC BK (NEEDLE) ×1 IMPLANT
NEEDLE SPNL 22GX5 LNG QUINC BK (NEEDLE) ×1 IMPLANT
NS IRRIG 500ML POUR BTL (IV SOLUTION) ×1 IMPLANT
OCCLUDER COLPOPNEUMO (BALLOONS) ×1 IMPLANT
PACK GYN LAPAROSCOPIC (MISCELLANEOUS) ×1 IMPLANT
PAD OB MATERNITY 4.3X12.25 (PERSONAL CARE ITEMS) ×1 IMPLANT
PAD PREP OB/GYN DISP 24X41 (PERSONAL CARE ITEMS) ×1 IMPLANT
POUCH ENDO CATCH II 15MM (MISCELLANEOUS) IMPLANT
SET CYSTO W/LG BORE CLAMP LF (SET/KITS/TRAYS/PACK) ×1 IMPLANT
SHEARS ENDO 5MM 31CM (CUTTER) ×1 IMPLANT
SPONGE T-LAP 4X18 ~~LOC~~+RFID (SPONGE) IMPLANT
SURGIFLO W/THROMBIN 8M KIT (HEMOSTASIS) IMPLANT
SUT ENDO VLOC 180-0-8IN (SUTURE) ×1 IMPLANT
SUT MNCRL 4-0 (SUTURE) ×2
SUT MNCRL 4-0 27XMFL (SUTURE) ×2
SUT VIC AB 0 CT1 36 (SUTURE) ×1 IMPLANT
SUT VICRYL 0 UR6 27IN ABS (SUTURE) IMPLANT
SUTURE MNCRL 4-0 27XMF (SUTURE) ×1 IMPLANT
SYR 10ML LL (SYRINGE) ×1 IMPLANT
SYR 3ML LL SCALE MARK (SYRINGE) ×2 IMPLANT
SYR 50ML LL SCALE MARK (SYRINGE) ×2 IMPLANT
SYS BAG RETRIEVAL 10MM (BASKET) ×1
SYSTEM BAG RETRIEVAL 10MM (BASKET) IMPLANT
TRAP FLUID SMOKE EVACUATOR (MISCELLANEOUS) ×1 IMPLANT
TROCAR BLUNT TIP 12MM OMST12BT (TROCAR) ×1 IMPLANT
TROCAR VERSASTEP PLUS 12MM (TROCAR) ×1 IMPLANT
TROCAR VERSASTEP PLUS 5MM (TROCAR) ×2 IMPLANT
TUBING EVAC SMOKE HEATED PNEUM (TUBING) ×1 IMPLANT
WATER STERILE IRR 500ML POUR (IV SOLUTION) ×1 IMPLANT

## 2023-08-04 NOTE — Anesthesia Procedure Notes (Signed)
Procedure Name: Intubation Date/Time: 08/04/2023 7:35 AM  Performed by: Mohammed Kindle, CRNAPre-anesthesia Checklist: Patient identified, Emergency Drugs available, Suction available and Patient being monitored Patient Re-evaluated:Patient Re-evaluated prior to induction Oxygen Delivery Method: Circle system utilized Preoxygenation: Pre-oxygenation with 100% oxygen Induction Type: IV induction and Cricoid Pressure applied Ventilation: Mask ventilation without difficulty Laryngoscope Size: McGraph and 3 Grade View: Grade II Tube type: Oral Number of attempts: 1 Airway Equipment and Method: Stylet Placement Confirmation: ETT inserted through vocal cords under direct vision, positive ETCO2, breath sounds checked- equal and bilateral and CO2 detector Secured at: 21 cm Tube secured with: Tape Dental Injury: Teeth and Oropharynx as per pre-operative assessment

## 2023-08-04 NOTE — Interval H&P Note (Signed)
History and Physical Interval Note:  08/04/2023 7:09 AM  Cassidy Norris  has presented today for surgery, with the diagnosis of Ovarian mass.  The various methods of treatment have been discussed with the patient and family. After consideration of risks, benefits and other options for treatment, the patient has consented to  Procedure(s): LAPAROSCOPIC BILATERAL SALPINGO OOPHORECTOMY, POSSIBLE REMOVAL OF UTERUS, PELVIC/AORTIC LYMPH NODES OMENTUM, LAPAROTOMY (N/A) as a surgical intervention.  The patient's history has been reviewed, patient examined, no change in status, stable for surgery.  I have reviewed the patient's chart and labs.  Questions were answered to the patient's satisfaction.     Leida Lauth

## 2023-08-04 NOTE — Anesthesia Preprocedure Evaluation (Signed)
Anesthesia Evaluation  Patient identified by MRN, date of birth, ID band Patient awake    Reviewed: Allergy & Precautions, H&P , NPO status , Patient's Chart, lab work & pertinent test results, reviewed documented beta blocker date and time   History of Anesthesia Complications (+) history of anesthetic complications  Airway Mallampati: II  TM Distance: >3 FB Neck ROM: full    Dental  (+) Teeth Intact   Pulmonary sleep apnea , COPD,  COPD inhaler, former smoker   Pulmonary exam normal        Cardiovascular Exercise Tolerance: Poor hypertension, On Medications negative cardio ROS Normal cardiovascular exam Rhythm:regular Rate:Normal     Neuro/Psych  Neuromuscular disease  negative psych ROS   GI/Hepatic Neg liver ROS, PUD,GERD  Medicated,,  Endo/Other  negative endocrine ROSdiabetes, Well Controlled    Renal/GU Renal disease  negative genitourinary   Musculoskeletal   Abdominal   Peds  Hematology  (+) Blood dyscrasia, anemia   Anesthesia Other Findings Past Medical History: No date: Anemia not really diagnosis but have pain: Arthritis 07/23/2016: B12 deficiency     Comment:  Will check levels to determine if injections are needed               again. Await results. Recent CBC at [facility] 1983 had a miscarriage/dnc: Blood transfusion without reported  diagnosis 12/30/2013: Cancer (HCC)     Comment:  Multifocal disease: pT1c,m, N0(isolated tumor cells) Er/              PR positive, Her 2 negative. 01/09/2014: Cancer (HCC)     Comment:  Invasive lobular carcinoma, 2.2 cm, T2,N1 ER/PR               positive, HER-2/neu negative 2014?: Cataract 10/27/2021: Centrilobular emphysema (HCC) 07/13/2023: CKD (chronic kidney disease), stage IV (HCC) 03/06/2022: Complication of anesthesia     Comment:  Prolonged metabolism of Rocuronium during case. No date: Diabetes mellitus without complication (HCC) No date: Diffuse  cystic mastopathy No date: Erosive esophagitis 09/24/2017: Essential hypertension, benign 2012: Family history of malignant neoplasm of gastrointestinal tract 08/28/2014: Fractured elbow No date: Gastroesophageal reflux disease No date: Hx of metabolic acidosis with increased anion gap No date: Increased heart rate No date: Multiple sclerosis (HCC) No date: Neuromuscular disorder (HCC)     Comment:  neuropathy in bil feet - left is worse. No date: Obesity, unspecified No date: Peripheral neuropathy No date: Personal history of tobacco use, presenting hazards to health No date: Restless leg syndrome No date: Sleep apnea     Comment:  uses CPAP No date: Special screening for malignant neoplasms, colon 08/24/2022: Squamous cell carcinoma of skin     Comment:  SCCIS - scalp, ED&C 07/20/2023: Squamous cell carcinoma of skin     Comment:  right of midline ant scalp post, tx with [facility] 07/20/2023: Squamous cell carcinoma of skin     Comment:  right of midline ant scalp ant, tx with ED&C Past Surgical History: 1973,2001: BREAST BIOPSY; Right 11/07/2013: BREAST BIOPSY; Right     Comment:  INVASIVE MAMMARY CARCINOMA , ER/PR positive Her2               negative 11/27/2013: BREAST BIOPSY; Left     Comment:  invasive lobular and DCIS 01/09/2014: BREAST SURGERY; Bilateral     Comment:  mastectomy 08/13/2022: cataract surgery; Left No date: CHOLECYSTECTOMY 2010: COLONOSCOPY     Comment:  Dr. Evette Cristal 06/11/2015: COLONOSCOPY WITH PROPOFOL; N/A     Comment:  Procedure: COLONOSCOPY WITH PROPOFOL;  Surgeon:               Kieth Brightly, MD;  Location: [facility] ENDOSCOPY;                Service: Endoscopy;  Laterality: N/A; 12/23/2021: COLONOSCOPY WITH PROPOFOL; N/A     Comment:  Procedure: COLONOSCOPY WITH PROPOFOL;  Surgeon: Toney Reil, MD;  Location: [facility] ENDOSCOPY;  Service:               Gastroenterology;  Laterality: N/A; 07/12/2018: DILATATION &  CURETTAGE/HYSTEROSCOPY WITH MYOSURE; N/A     Comment:  Procedure: DILATATION & CURETTAGE/HYSTEROSCOPY WITH               MYOSURE WITH ENDOMETRIAL POLYPECTOMY;  Surgeon: Conard Novak, MD;  Location: [facility] ORS;  Service: Gynecology;              Laterality: N/A; 1983: DILATION AND CURETTAGE OF UTERUS 12/23/2021: ESOPHAGOGASTRODUODENOSCOPY; N/A     Comment:  Procedure: ESOPHAGOGASTRODUODENOSCOPY (EGD);  Surgeon:               Toney Reil, MD;  Location: [facility] ENDOSCOPY;                Service: Gastroenterology;  Laterality: N/A; 11/23/2015: JOINT REPLACEMENT; Left     Comment:  right  2023 03/06/2022: KNEE ARTHROPLASTY; Right     Comment:  Procedure: COMPUTER ASSISTED TOTAL KNEE ARTHROPLASTY;                Surgeon: Donato Heinz, MD;  Location: [facility] ORS;                Service: Orthopedics;  Laterality: Right; 1989: POLYPECTOMY No date: PORTA CATH REMOVAL 2015: PORTACATH PLACEMENT No date: SKIN BIOPSY     Comment:  on scalp 09/14/2014: SPINE SURGERY     Comment:  Ruptured disk L4- L5 11/22/2015: TOTAL KNEE ARTHROPLASTY; Left     Comment:  Procedure: TOTAL KNEE ARTHROPLASTY;  Surgeon: Frederico Hamman, MD;  Location: [facility] OR;  Service: Orthopedics;                Laterality: Left; No date: TUBAL LIGATION 2006: WISDOM TOOTH EXTRACTION BMI    Body Mass Index: 29.63 kg/m     Reproductive/Obstetrics negative OB ROS                             Anesthesia Physical Anesthesia Plan  ASA: 4  Anesthesia Plan: General ETT   Post-op Pain Management:    Induction:   PONV Risk Score and Plan:   Airway Management Planned:   Additional Equipment:   Intra-op Plan:   Post-operative Plan:   Informed Consent: I have reviewed the patients History and Physical, chart, labs and discussed the procedure including the risks, benefits and alternatives for the proposed anesthesia with the patient or authorized representative who  has indicated his/her understanding and acceptance.     Dental Advisory Given  Plan Discussed with: CRNA  Anesthesia Plan Comments:        Anesthesia Quick Evaluation

## 2023-08-04 NOTE — Anesthesia Postprocedure Evaluation (Signed)
Anesthesia Post Note  Patient: Cassidy Norris  Procedure(s) Performed: LAPAROSCOPIC BILATERAL SALPINGO OOPHORECTOMY (Abdomen)  Patient location during evaluation: PACU Anesthesia Type: General Level of consciousness: awake and alert, oriented and patient cooperative Pain management: pain level controlled Vital Signs Assessment: post-procedure vital signs reviewed and stable Respiratory status: spontaneous breathing, nonlabored ventilation and respiratory function stable Cardiovascular status: blood pressure returned to baseline and stable Postop Assessment: adequate PO intake Anesthetic complications: no   No notable events documented.   Last Vitals:  Vitals:   08/04/23 1009 08/04/23 1036  BP: 117/69   Pulse: 92 96  Resp: 18   Temp: 36.6 C   SpO2: 98%     Last Pain:  Vitals:   08/04/23 1009  TempSrc: Oral  PainSc: 2                  Reed Breech

## 2023-08-04 NOTE — Op Note (Signed)
  Operative Note   08/04/2023 9:49 AM  PRE-OP DIAGNOSIS: Ovarian mass left with elevated testosterone and vitilization   POST-OP DIAGNOSIS: same  SURGEON: Surgeons and Role:    Leida Lauth, MD - Primary    * Conard Novak, MD - Assisting  ANESTHESIA: General   PROCEDURE: LAPAROSCOPIC BILATERAL SALPINGO OOPHORECTOMY   ESTIMATED BLOOD LOSS: Minimal  DRAINS: none   TOTAL IV FLUIDS: per Anesthesia  SPECIMENS: bilateral tubes and ovaries and washings.   COMPLICATIONS: none  DISPOSITION: PACU - hemodynamically stable.  CONDITION: stable  INDICATIONS: PET positive left ovarian mass with elevated testosterone and virilization.   FINDINGS: Normal uterus and pelvis. Left ovary 4 cm, slightly enlarged.  Right adnexa negative.   PROCEDURE IN DETAIL: After informed consent was obtained, the patient was taken to the operating room where anesthesia was obtained without difficulty. The patient was positioned in the dorsal lithotomy position in Raymond stirrups and her arms were carefully tucked at her sides and the usual precautions were taken.  She was prepped and draped in normal sterile fashion.  Time-out was performed and a Foley catheter was placed into the bladder.  An open Hasson technique was used to place an infraumbilical 10-mm baloon trocar under direct visualization. The laparoscope was introduced and CO2 gas was infused for pneumoperitoneum to a pressure of 15 mm Hg.  Right and left lateral and suprapubic 5-mm ports were placed under direct visualization of the laparoscope using an EndoStep technique.  Cytologic washings were obtained.  The patient was placed in Trendelenburg and the bowel was displaced up into the upper abdomen.    The retroperitoneal space was opened bilaterally.  The ureters were identified and preserved.  The fallopian tubes and utero ovarian ligaments were transected from the uterus with the LigaSure.  The infundibulopelvic ligaments were  skeletonized, sealed and divided with the LigaSure device.  The left ovary was placed in an endocatch bag and removed via the umbilical port.  The right was removed via the umbilical port with the spoons.    Hemostasis was observed. The intraperitoneal pressure was dropped, and all planes of dissection, and vascular pedicles were found to be hemostatic.  The 5 mm trocars were removed under visualization.   Before the umbilical trocar was removed the CO2 gas was released.  The fascia there was closed with 0 Vicryl suture.   The skin incisions were closed with subcuticular stitches and glue.  The patient tolerated the procedure well.  Sponge, lap and needle counts were correct x2.  The patient was taken to recovery room in excellent condition.  Antibiotics:  not indicated    VTE prophylaxis: was ordered perioperatively with SQ Heparin and IPCs.  Leida Lauth, MD

## 2023-08-04 NOTE — Discharge Instructions (Addendum)
Use pain medication sparingly.     AMBULATORY SURGERY  DISCHARGE INSTRUCTIONS  The drugs that you were given will stay in your system until tomorrow so for the next 24 hours you should not:  Drive an automobile Make any legal decisions Drink any alcoholic beverage  You may resume regular meals tomorrow.  Today it is better to start with liquids and gradually work up to solid foods.  You may eat anything you prefer, but it is better to start with liquids, then soup and crackers, and gradually work up to solid foods.  Please notify your doctor immediately if you have any unusual bleeding, trouble breathing, redness and pain at the surgery site, drainage, fever, or pain not relieved by medication.  Additional Instructions:  Please contact your physician with any problems or Same Day Surgery at [phone], Monday through Friday 6 am to 4 pm, or [place] at [facility] number at [phone].

## 2023-08-04 NOTE — Transfer of Care (Signed)
Immediate Anesthesia Transfer of Care Note  Patient: Cassidy Norris  Procedure(s) Performed: LAPAROSCOPIC BILATERAL SALPINGO OOPHORECTOMY (Abdomen)  Patient Location: PACU  Anesthesia Type:General  Level of Consciousness: awake, drowsy, and patient cooperative  Airway & Oxygen Therapy: Patient Spontanous Breathing  Post-op Assessment: Report given to RN and Post -op Vital signs reviewed and stable  Post vital signs: Reviewed and stable  Last Vitals:  Vitals Value Taken Time  BP 128/66 08/04/23 0930  Temp    Pulse 95 08/04/23 0932  Resp 20 08/04/23 0932  SpO2 97 % 08/04/23 0932  Vitals shown include unfiled device data.  Last Pain:  Vitals:   08/04/23 0610  TempSrc: Tympanic  PainSc: 0-No pain         Complications: No notable events documented.

## 2023-08-05 ENCOUNTER — Encounter: Payer: Self-pay | Admitting: Obstetrics and Gynecology

## 2023-08-06 LAB — CYTOLOGY - NON PAP

## 2023-08-06 LAB — SURGICAL PATHOLOGY

## 2023-08-07 LAB — TYPE AND SCREEN
ABO/RH(D): O POS
Antibody Screen: POSITIVE
Unit division: 0
Unit division: 0

## 2023-08-07 LAB — BPAM RBC
Blood Product Expiration Date: 202410022359
Blood Product Expiration Date: 202410102359
Unit Type and Rh: 5100
Unit Type and Rh: 5100

## 2023-08-12 ENCOUNTER — Inpatient Hospital Stay: Payer: Medicare Other | Attending: Oncology

## 2023-08-12 ENCOUNTER — Inpatient Hospital Stay: Payer: Medicare Other

## 2023-08-12 ENCOUNTER — Encounter: Payer: Self-pay | Admitting: Oncology

## 2023-08-12 ENCOUNTER — Inpatient Hospital Stay (HOSPITAL_BASED_OUTPATIENT_CLINIC_OR_DEPARTMENT_OTHER): Payer: Medicare Other | Admitting: Oncology

## 2023-08-12 ENCOUNTER — Inpatient Hospital Stay: Payer: Medicare Other | Admitting: Pharmacist

## 2023-08-12 VITALS — BP 120/59 | HR 92 | Temp 97.2°F | Resp 16 | Ht 65.5 in | Wt 182.5 lb

## 2023-08-12 DIAGNOSIS — Z170 Estrogen receptor positive status [ER+]: Secondary | ICD-10-CM | POA: Diagnosis not present

## 2023-08-12 DIAGNOSIS — C50919 Malignant neoplasm of unspecified site of unspecified female breast: Secondary | ICD-10-CM

## 2023-08-12 DIAGNOSIS — G35 Multiple sclerosis: Secondary | ICD-10-CM | POA: Diagnosis not present

## 2023-08-12 DIAGNOSIS — C50911 Malignant neoplasm of unspecified site of right female breast: Secondary | ICD-10-CM | POA: Diagnosis not present

## 2023-08-12 DIAGNOSIS — M8580 Other specified disorders of bone density and structure, unspecified site: Secondary | ICD-10-CM | POA: Diagnosis not present

## 2023-08-12 DIAGNOSIS — D3912 Neoplasm of uncertain behavior of left ovary: Secondary | ICD-10-CM | POA: Insufficient documentation

## 2023-08-12 DIAGNOSIS — D649 Anemia, unspecified: Secondary | ICD-10-CM | POA: Diagnosis not present

## 2023-08-12 DIAGNOSIS — Z5111 Encounter for antineoplastic chemotherapy: Secondary | ICD-10-CM | POA: Diagnosis not present

## 2023-08-12 DIAGNOSIS — C50912 Malignant neoplasm of unspecified site of left female breast: Secondary | ICD-10-CM | POA: Diagnosis not present

## 2023-08-12 DIAGNOSIS — C778 Secondary and unspecified malignant neoplasm of lymph nodes of multiple regions: Secondary | ICD-10-CM | POA: Diagnosis not present

## 2023-08-12 LAB — CBC WITH DIFFERENTIAL/PLATELET
Abs Immature Granulocytes: 0.09 10*3/uL — ABNORMAL HIGH (ref 0.00–0.07)
Basophils Absolute: 0.1 10*3/uL (ref 0.0–0.1)
Basophils Relative: 1 %
Eosinophils Absolute: 0.3 10*3/uL (ref 0.0–0.5)
Eosinophils Relative: 4 %
HCT: 31.6 % — ABNORMAL LOW (ref 36.0–46.0)
Hemoglobin: 10.2 g/dL — ABNORMAL LOW (ref 12.0–15.0)
Immature Granulocytes: 1 %
Lymphocytes Relative: 20 %
Lymphs Abs: 1.9 10*3/uL (ref 0.7–4.0)
MCH: 33.8 pg (ref 26.0–34.0)
MCHC: 32.3 g/dL (ref 30.0–36.0)
MCV: 104.6 fL — ABNORMAL HIGH (ref 80.0–100.0)
Monocytes Absolute: 1 10*3/uL (ref 0.1–1.0)
Monocytes Relative: 11 %
Neutro Abs: 5.9 10*3/uL (ref 1.7–7.7)
Neutrophils Relative %: 63 %
Platelets: 236 10*3/uL (ref 150–400)
RBC: 3.02 MIL/uL — ABNORMAL LOW (ref 3.87–5.11)
RDW: 13.6 % (ref 11.5–15.5)
WBC: 9.3 10*3/uL (ref 4.0–10.5)
nRBC: 0 % (ref 0.0–0.2)

## 2023-08-12 LAB — CMP (CANCER CENTER ONLY)
ALT: 23 U/L (ref 0–44)
AST: 27 U/L (ref 15–41)
Albumin: 4 g/dL (ref 3.5–5.0)
Alkaline Phosphatase: 77 U/L (ref 38–126)
Anion gap: 12 (ref 5–15)
BUN: 35 mg/dL — ABNORMAL HIGH (ref 8–23)
CO2: 27 mmol/L (ref 22–32)
Calcium: 9.4 mg/dL (ref 8.9–10.3)
Chloride: 105 mmol/L (ref 98–111)
Creatinine: 1.56 mg/dL — ABNORMAL HIGH (ref 0.44–1.00)
GFR, Estimated: 36 mL/min — ABNORMAL LOW (ref >60)
Glucose, Bld: 115 mg/dL — ABNORMAL HIGH (ref 70–99)
Potassium: 4.2 mmol/L (ref 3.5–5.1)
Sodium: 144 mmol/L (ref 135–145)
Total Bilirubin: 0.5 mg/dL (ref 0.3–1.2)
Total Protein: 7.4 g/dL (ref 6.5–8.1)

## 2023-08-12 LAB — MAGNESIUM: Magnesium: 1.9 mg/dL (ref 1.7–2.4)

## 2023-08-12 MED ORDER — FULVESTRANT 250 MG/5ML IM SOSY
500.0000 mg | PREFILLED_SYRINGE | Freq: Once | INTRAMUSCULAR | Status: AC
  Administered 2023-08-12: 500 mg via INTRAMUSCULAR
  Filled 2023-08-12: qty 10

## 2023-08-12 NOTE — Progress Notes (Signed)
[facility]  [phone]  [phone]     Cassidy Norris DOB: 14-Jul-1951  MR#: [id number]  VZD#:[id number]  Patient Care Team: Smitty Cords, DO as PCP - General (Family Medicine) Drema Dallas, DO as Consulting Physician (Neurology) Jeralyn Ruths, MD as Consulting Physician (Oncology) Benita Gutter, RN as Oncology Nurse Navigator Isla Pence, OD (Optometry)  CHIEF COMPLAINT: Recurrent, metastatic ER/PR, HER2 negative invasive carcinoma of the breast.    INTERVAL HISTORY: Patient returns to clinic today for further evaluation, postop follow-up, and continuation of fulvestrant.  She currently feels well and is asymptomatic.  She only has some mild abdominal discomfort from her oophorectomy last week. She has no new neurologic complaints.  She denies any recent fevers or illnesses.  She has a fair appetite and denies weight loss.  She has no chest pain, shortness of breath, cough, or hemoptysis.  She denies any nausea, vomiting, constipation, or diarrhea.  She has no urinary complaints.  Patient offers no further specific complaints today.  REVIEW OF SYSTEMS:   Review of Systems  Constitutional: Negative.  Negative for fever, malaise/fatigue and weight loss.  Respiratory: Negative.  Negative for cough, hemoptysis and shortness of breath.   Cardiovascular: Negative.  Negative for chest pain and leg swelling.  Gastrointestinal: Negative.  Negative for abdominal pain.  Genitourinary: Negative.  Negative for dysuria.  Musculoskeletal: Negative.  Negative for joint pain and myalgias.  Skin: Negative.  Negative for rash.  Neurological: Negative.  Negative for dizziness, sensory change, focal weakness, weakness and headaches.  Psychiatric/Behavioral: Negative.  The patient is not nervous/anxious.     As per HPI. Otherwise, a complete review of systems is negative.  ONCOLOGY HISTORY: Oncology History Overview Note  1. Bilateral  carcinoma of breast, February, 2015. Right breast status post radical mastectomy. T1 C. N0M0 (isolated tumor cells in one lymph node) multifocal invasive cancer. Stage IC, Left breast, Status post radical mastectomy, T2, N1, M0 tumor. Stage II, RIght breast. Both tumors are estrogen receptor positive.  Progesterone receptor positive.  HER-2 receptor negative by FISH. Negative for BRCA mutation.  2. Started on Adriamycin and Cytoxan on March 01, 2014. Last cycle of Cytoxan and Adriamycin on May 01, 2014. Patient has finished last chemotherapy on September 9. (12 th dose was omitted because of neuropathy) 3.  Taking tamoxifen.  Patient had a poor tolerance to letrozole.(October, 2015) 4.  Patient is taking letrozole since November of 2015 5.  May, 2016 Letrozole has been put on hold because of bony pains 6.  Started on Aromasin from July of 2016    History of bilateral breast cancer (Resolved)  11/29/2013 Initial Diagnosis   Breast cancer bilateral, multifocal ER/PR pos, Her 2 neg,.     PAST MEDICAL HISTORY: Past Medical History:  Diagnosis Date   Anemia    Arthritis not really diagnosis but have pain   B12 deficiency 07/23/2016   Will check levels to determine if injections are needed again. Await results. Recent CBC at Onc WNL   Blood transfusion without reported diagnosis 1983 had a miscarriage/dnc   Cancer (HCC) 12/30/2013   Multifocal disease: pT1c,m, N0(isolated tumor cells) Er/ PR positive, Her 2 negative.   Cancer (HCC) 01/09/2014   Invasive lobular carcinoma, 2.2 cm, T2,N1 ER/PR positive, HER-2/neu negative   Cataract 2014?   Centrilobular emphysema (HCC) 10/27/2021   CKD (chronic kidney disease), stage IV (HCC) 07/13/2023   Complication of anesthesia 03/06/2022   Prolonged metabolism of Rocuronium  during case.   Diabetes mellitus without complication (HCC)    Diffuse cystic mastopathy    Erosive esophagitis    Essential hypertension, benign 09/24/2017   Family history of  malignant neoplasm of gastrointestinal tract 2012   Fractured elbow 08/28/2014   Gastroesophageal reflux disease    Hx of metabolic acidosis with increased anion gap    Increased heart rate    Multiple sclerosis (HCC)    Neuromuscular disorder (HCC)    neuropathy in bil feet - left is worse.   Obesity, unspecified    Peripheral neuropathy    Personal history of tobacco use, presenting hazards to health    Restless leg syndrome    Sleep apnea    uses CPAP   Special screening for malignant neoplasms, colon    Squamous cell carcinoma of skin 08/24/2022   SCCIS - scalp, ED&C   Squamous cell carcinoma of skin 07/20/2023   right of midline ant scalp post, tx with ED&C   Squamous cell carcinoma of skin 07/20/2023   right of midline ant scalp ant, tx with ED&C    PAST SURGICAL HISTORY: Past Surgical History:  Procedure Laterality Date   BREAST BIOPSY Right 1973,2001   BREAST BIOPSY Right 11/07/2013   INVASIVE MAMMARY CARCINOMA , ER/PR positive Her2 negative   BREAST BIOPSY Left 11/27/2013   invasive lobular and DCIS   BREAST SURGERY Bilateral 01/09/2014   mastectomy   cataract surgery Left 08/13/2022   CHOLECYSTECTOMY     COLONOSCOPY  2010   Dr. Evette Cristal   COLONOSCOPY WITH PROPOFOL N/A 06/11/2015   Procedure: COLONOSCOPY WITH PROPOFOL;  Surgeon: Kieth Brightly, MD;  Location: [facility] ENDOSCOPY;  Service: Endoscopy;  Laterality: N/A;   COLONOSCOPY WITH PROPOFOL N/A 12/23/2021   Procedure: COLONOSCOPY WITH PROPOFOL;  Surgeon: Toney Reil, MD;  Location[facility] ENDOSCOPY;  Service: Gastroenterology;  Laterality: N/A;   DILATATION & CURETTAGE/HYSTEROSCOPY WITH MYOSURE N/A 07/12/2018   Procedure: DILATATION & CURETTAGE/HYSTEROSCOPY WITH MYOSURE WITH ENDOMETRIAL POLYPECTOMY;  Surgeon: Conard Novak, MD;  Location: [facility] ORS;  Service: Gynecology;  Laterality: N/A;   DILATION AND CURETTAGE OF UTERUS  1983   ESOPHAGOGASTRODUODENOSCOPY N/A 12/23/2021   Procedure:  ESOPHAGOGASTRODUODENOSCOPY (EGD);  Surgeon: Toney Reil, MD;  Location: [facility] ENDOSCOPY;  Service: Gastroenterology;  Laterality: N/A;   JOINT REPLACEMENT Left 11/23/2015   right  2023   KNEE ARTHROPLASTY Right 03/06/2022   Procedure: COMPUTER ASSISTED TOTAL KNEE ARTHROPLASTY;  Surgeon: Donato Heinz, MD;  Location: [facility] ORS;  Service: Orthopedics;  Laterality: Right;   LAPAROSCOPIC BILATERAL SALPINGO OOPHERECTOMY N/A 08/04/2023   Procedure: LAPAROSCOPIC BILATERAL SALPINGO OOPHORECTOMY;  Surgeon: Leida Lauth, MD;  Location: [facility] ORS;  Service: Gynecology;  Laterality: N/A;   POLYPECTOMY  1989   PORTA CATH REMOVAL     PORTACATH PLACEMENT  2015   SKIN BIOPSY     on scalp   SPINE SURGERY  09/14/2014   Ruptured disk L4- L5   TOTAL KNEE ARTHROPLASTY Left 11/22/2015   Procedure: TOTAL KNEE ARTHROPLASTY;  Surgeon: Frederico Hamman, MD;  Location: [facility] OR;  Service: Orthopedics;  Laterality: Left;   TUBAL LIGATION     WISDOM TOOTH EXTRACTION  2006    FAMILY HISTORY Family History  Problem Relation Age of Onset   Cancer Mother        lung   COPD Mother    Cancer Father        colon, stomach   Diabetes Maternal Grandmother    Hypertension Sister  Rectal cancer Paternal Uncle    Rectal cancer Paternal Uncle     GYNECOLOGIC HISTORY:  Patient's last menstrual period was 12/22/1999 (approximate).     ADVANCED DIRECTIVES:    HEALTH MAINTENANCE: Social History   Tobacco Use   Smoking status: Former    Current packs/day: 0.00    Average packs/day: 1 pack/day for 30.0 years (30.0 ttl pk-yrs)    Types: Cigarettes    Start date: 12/12/1983    Quit date: 12/11/2013    Years since quitting: 9.6   Smokeless tobacco: Former  Building services engineer status: Never Used  Substance Use Topics   Alcohol use: Not Currently   Drug use: No     Colonoscopy:  PAP:  Bone density:  Mammogram:  No Known Allergies  Current Outpatient Medications  Medication Sig Dispense Refill    acetaminophen (TYLENOL) 500 MG tablet Take 1,000 mg by mouth 2 (two) times daily as needed for moderate pain or headache.     baclofen (LIORESAL) 10 MG tablet Take 1 tablet (10 mg total) by mouth 3 (three) times daily as needed for muscle spasms. 1080 tablet 0   calcium carbonate (OSCAL) 1500 (600 Ca) MG TABS tablet Take by mouth 2 (two) times daily with a meal.     dapagliflozin propanediol (FARXIGA) 10 MG TABS tablet Take 10 mg by mouth daily.     diclofenac Sodium (VOLTAREN) 1 % GEL Apply 2 g topically 3 (three) times daily as needed. 100 g 2   fluticasone (FLONASE) 50 MCG/ACT nasal spray PLACE 2 SPRAYS INTO BOTH NOSTRILS AT BEDTIME. 48 g 1   fulvestrant (FASLODEX) 250 MG/5ML injection Inject 500 mg into the muscle every 30 (thirty) days. One injection each buttock over 1-2 minutes. Warm prior to use.     Krill Oil 1000 MG CAPS Take 1,000 mg by mouth daily.     lisinopril (ZESTRIL) 5 MG tablet Take 1 tablet (5 mg total) by mouth daily. 90 tablet 3   loperamide (IMODIUM A-D) 2 MG tablet Take 2 tablets (4 mg total) by mouth 4 (four) times daily as needed for diarrhea or loose stools. 30 tablet 0   metFORMIN (GLUCOPHAGE) 500 MG tablet Take 1 tablet (500 mg total) by mouth 2 (two) times daily with a meal. 180 tablet 3   omeprazole (PRILOSEC) 40 MG capsule TAKE 1 CAPSULE BY MOUTH TWICE A DAY 180 capsule 1   ondansetron (ZOFRAN) 8 MG tablet Take 1 tablet (8 mg total) by mouth every 8 (eight) hours as needed for nausea or vomiting. 20 tablet 0   prochlorperazine (COMPAZINE) 10 MG tablet Take 1 tablet (10 mg total) by mouth every 6 (six) hours as needed for nausea or vomiting. 20 tablet 1   ribociclib succ (KISQALI, 600 MG DOSE,) 200 MG Therapy Pack Take 3 tablets (600 mg total) by mouth daily. Take for 21 days on, 7 days off, repeat every 28 days. 63 tablet 5   sodium chloride (OCEAN) 0.65 % SOLN nasal spray Place 1 spray into both nostrils as needed for congestion.     traZODone (DESYREL) 50 MG tablet  Take 1 tablet (50 mg total) by mouth at bedtime as needed. for sleep 90 tablet 3   venlafaxine XR (EFFEXOR-XR) 75 MG 24 hr capsule Take 1 capsule (75 mg total) by mouth daily with breakfast. 360 capsule 0   vitamin B-12 (CYANOCOBALAMIN) 1000 MCG tablet Take 2,500 mcg by mouth every Monday, Wednesday, and Friday.     Vitamin  D, Cholecalciferol, 50 MCG (2000 UT) CAPS Take 2,000 Units by mouth every Monday, Wednesday, and Friday.     No current facility-administered medications for this visit.    OBJECTIVE: BP (!) 120/59 (BP Location: Left Arm, Patient Position: Sitting, Cuff Size: Normal)   Pulse 92   Temp (!) 97.2 F (36.2 C) (Tympanic)   Resp 16   Ht 5' 5.5" (1.664 m)   Wt 182 lb 8 oz (82.8 kg)   LMP 12/22/1999 (Approximate)   SpO2 100%   BMI 29.91 kg/m    Body mass index is 29.91 kg/m.    ECOG FS:0 - Asymptomatic  General: Well-developed, well-nourished, no acute distress. Eyes: Pink conjunctiva, anicteric sclera. HEENT: Normocephalic, moist mucous membranes. Lungs: No audible wheezing or coughing. Heart: Regular rate and rhythm. Abdomen: Soft, nontender, no obvious distention. Musculoskeletal: No edema, cyanosis, or clubbing. Neuro: Alert, answering all questions appropriately. Cranial nerves grossly intact. Skin: No rashes or petechiae noted. Psych: Normal affect.  LAB RESULTS:  Appointment on 08/12/2023  Component Date Value Ref Range Status   Magnesium 08/12/2023 1.9  1.7 - 2.4 mg/dL Final   Performed at [facility], [address] [place], [place] [address]   Sodium 08/12/2023 144  135 - 145 mmol/L Final   Potassium 08/12/2023 4.2  3.5 - 5.1 mmol/L Final   Chloride 08/12/2023 105  98 - 111 mmol/L Final   CO2 08/12/2023 27  22 - 32 mmol/L Final   Glucose, Bld 08/12/2023 115 (H)  70 - 99 mg/dL Final   Glucose reference range applies only to samples taken after fasting for at least 8 hours.   BUN 08/12/2023 35 (H)  8 - 23 mg/dL Final   Creatinine 96/29/5284  1.56 (H)  0.44 - 1.00 mg/dL Final   Calcium 13/24/4010 9.4  8.9 - 10.3 mg/dL Final   Total Protein 27/25/3664 7.4  6.5 - 8.1 g/dL Final   Albumin 40/34/7425 4.0  3.5 - 5.0 g/dL Final   AST 95/63/8756 27  15 - 41 U/L Final   ALT 08/12/2023 23  0 - 44 U/L Final   Alkaline Phosphatase 08/12/2023 77  38 - 126 U/L Final   Total Bilirubin 08/12/2023 0.5  0.3 - 1.2 mg/dL Final   GFR, Estimated 08/12/2023 36 (L)  >60 mL/min Final   Comment: (NOTE) Calculated using the CKD-EPI Creatinine Equation (2021)    Anion gap 08/12/2023 12  5 - 15 Final   Performed at [facility], [address] [place], [place] [address]   WBC 08/12/2023 9.3  4.0 - 10.5 K/uL Final   RBC 08/12/2023 3.02 (L)  3.87 - 5.11 MIL/uL Final   Hemoglobin 08/12/2023 10.2 (L)  12.0 - 15.0 g/dL Final   HCT 51/88/4166 31.6 (L)  36.0 - 46.0 % Final   MCV 08/12/2023 104.6 (H)  80.0 - 100.0 fL Final   MCH 08/12/2023 33.8  26.0 - 34.0 pg Final   MCHC 08/12/2023 32.3  30.0 - 36.0 g/dL Final   RDW 05/22/1600 13.6  11.5 - 15.5 % Final   Platelets 08/12/2023 236  150 - 400 K/uL Final   nRBC 08/12/2023 0.0  0.0 - 0.2 % Final   Neutrophils Relative % 08/12/2023 63  % Final   Neutro Abs 08/12/2023 5.9  1.7 - 7.7 K/uL Final   Lymphocytes Relative 08/12/2023 20  % Final   Lymphs Abs 08/12/2023 1.9  0.7 - 4.0 K/uL Final   Monocytes Relative 08/12/2023 11  % Final  Monocytes Absolute 08/12/2023 1.0  0.1 - 1.0 K/uL Final   Eosinophils Relative 08/12/2023 4  % Final   Eosinophils Absolute 08/12/2023 0.3  0.0 - 0.5 K/uL Final   Basophils Relative 08/12/2023 1  % Final   Basophils Absolute 08/12/2023 0.1  0.0 - 0.1 K/uL Final   Immature Granulocytes 08/12/2023 1  % Final   Abs Immature Granulocytes 08/12/2023 0.09 (H)  0.00 - 0.07 K/uL Final   Performed at [facility], [address] [place], [place] [address]    STUDIES: No results found.  ONCOLOGY HISTORY:  Bilateral adenocarcinoma of the breast. Patient is status post  bilateral mastectomy in February 2015. She also received adjuvant chemotherapy with Adriamycin, Cytoxan, and Taxol completing treatment in approximately October 2015. She then initiated letrozole, but could not tolerate secondary to leg pain and was switched to Aromasin.  Patient then switched to tamoxifen in October 2019 secondary to cost.  Patient subsequently discontinued tamoxifen and did not complete the recommended 5 years of treatment.   ASSESSMENT: Recurrent, metastatic ER/PR, HER2 negative invasive carcinoma of the breast.   PLAN:    Recurrent, metastatic ER/PR, HER2 negative invasive carcinoma of the breast: See oncology history as above.  Biopsy confirmed recurrent disease.  PET scan results from December 26, 2021 reviewed independently with hypermetabolic left supraclavicular, subpectoral, and axillary lymphadenopathy consistent with nodal recurrence of patient's history of breast cancer.  No distant metastases were identified.  Her most recent PET scan on May 26, 2023 reviewed independently with improved disease burden. Her initial CA 27-29 initially was 260.1.  Now seems to have plateaued between 81.1 and 98.5.  Her most recent result was 98.5.  Of note, patient also had metastatic breast cancer in her surgical specimen from her oophorectomy.  His quality remains on hold postoperatively.  Proceed with fulvestrant today.  Patient has been instructed to reinitiate Kisqali in 1 week.  Return to clinic in 5 weeks for further evaluation and continuation of treatment.  Appreciate clinical pharmacy input.   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  Follow-up with gynecology oncology as scheduled. Osteopenia: Patient's last bone mineral density was on August 01, 2016 and revealed a T-score of -1.2. Continue monitoring bone mineral density by PCP. Multiple sclerosis: Chronic and unchanged.  Continue evaluation and treatment per neurology.  Recent MRI of the brain did not  reveal any new lesions. Renal insufficiency: Chronic and unchanged.  Patient's GFR has improved to 36.  Kisqali may need to be dose reduced if GFR remains persistently less than 30. Anemia: Hemoglobin trended down slightly to 10.2, monitor.   Diarrhea: Patient does not complain of this today.  Continue Imodium as needed.  Reflux: Continue omeprazole.    Patient expressed understanding and was in agreement with this plan. She also understands that She can call clinic at any time with any questions, concerns, or complaints.   Breast cancer bilateral, multifocal ER/PR pos, Her 2 neg.   Staging form: Breast, AJCC 7th Edition     Clinical: Stage IIB (T2, N1, M0) - Signed by Johney Maine, MD on 04/22/2015   Jeralyn Ruths, MD   08/12/2023 9:39 AM

## 2023-08-12 NOTE — Progress Notes (Signed)
Oral Chemotherapy Clinic [facility]  [phone] [phone]  Patient Care Team: Smitty Cords, DO as PCP - General (Family Medicine) Drema Dallas, DO as Consulting Physician (Neurology) Jeralyn Ruths, MD as Consulting Physician (Oncology) Benita Gutter, RN as Oncology Nurse Navigator Isla Pence, [place] (Optometry)   Name of the patient: Cassidy Norris  [id number]  1950-12-07   Date of visit: 08/12/23  HPI: Patient is a 72 y.o. female with recurrent breast cancer, ER/PR positive/HER2 negative. Currently treating with Kisqali (ribociclib) and fulvestrant. She started ribociclib on 01/08/22. Her treatment was held for surgery which was completed on 08/04/23. Patient will resume her ribociclib on 08/19/23.  Reason for Consult: Oral chemotherapy follow-up for ribociclib therapy.   PAST MEDICAL HISTORY: Past Medical History:  Diagnosis Date   Anemia    Arthritis not really diagnosis but have pain   B12 deficiency 07/23/2016   Will check levels to determine if injections are needed again. Await results. Recent CBC at Onc [facility]NL   Blood transfusion without reported diagnosis 1983 had a miscarriage/dnc   Cancer (HCC) 12/30/2013   Multifocal disease: pT1c,m, N0(isolated tumor cells) Er/ PR positive, Her 2 negative.   Cancer (HCC) 01/09/2014   Invasive lobular carcinoma, 2.2 cm, T2,N1 ER/PR positive, HER-2/neu negative   Cataract 2014?   Centrilobular emphysema (HCC) 10/27/2021   CKD (chronic kidney disease), stage IV (HCC) 07/13/2023   Complication of anesthesia 03/06/2022   Prolonged metabolism of Rocuronium during case.   Diabetes mellitus without complication (HCC)    Diffuse cystic mastopathy    Erosive esophagitis    Essential hypertension, benign 09/24/2017   Family history of malignant neoplasm of gastrointestinal tract 2012   Fractured elbow 08/28/2014   Gastroesophageal reflux disease    Hx of metabolic acidosis with  increased anion gap    Increased heart rate    Multiple sclerosis (HCC)    Neuromuscular disorder (HCC)    neuropathy in bil feet - left is worse.   Obesity, unspecified    Peripheral neuropathy    Personal history of tobacco use, presenting hazards to health    Restless leg syndrome    Sleep apnea    uses CPAP   Special screening for malignant neoplasms, colon    Squamous cell carcinoma of skin 08/24/2022   SCCIS - scalp, ED&C   Squamous cell carcinoma of skin 07/20/2023   right of midline ant scalp post, tx with ED&C   Squamous cell carcinoma of skin 07/20/2023   right of midline ant scalp ant, tx with [facility]    HEMATOLOGY/ONCOLOGY HISTORY:  Oncology History Overview Note  1. Bilateral carcinoma of breast, February, 2015. Right breast status post radical mastectomy. T1 C. N0M0 (isolated tumor cells in one lymph node) multifocal invasive cancer. Stage IC, Left breast, Status post radical mastectomy, T2, N1, M0 tumor. Stage II, RIght breast. Both tumors are estrogen receptor positive.  Progesterone receptor positive.  HER-2 receptor negative by FISH. Negative for BRCA mutation.  2. Started on Adriamycin and Cytoxan on March 01, 2014. Last cycle of Cytoxan and Adriamycin on May 01, 2014. Patient has finished last chemotherapy on September 9. (12 th dose was omitted because of neuropathy) 3.  Taking tamoxifen.  Patient had a poor tolerance to letrozole.(October, 2015) 4.  Patient is taking letrozole since November of 2015 5.  May, 2016 Letrozole has been put on hold because of bony pains 6.  Started on Aromasin from July of 2016  History of bilateral breast cancer (Resolved)  11/29/2013 Initial Diagnosis   Breast cancer bilateral, multifocal ER/PR pos, Her 2 neg,.     ALLERGIES:  has No Known Allergies.  MEDICATIONS:  Current Outpatient Medications  Medication Sig Dispense Refill   acetaminophen (TYLENOL) 500 MG tablet Take 1,000 mg by mouth 2 (two) times daily as needed for  moderate pain or headache.     baclofen (LIORESAL) 10 MG tablet Take 1 tablet (10 mg total) by mouth 3 (three) times daily as needed for muscle spasms. 1080 tablet 0   calcium carbonate (OSCAL) 1500 (600 Ca) MG TABS tablet Take by mouth 2 (two) times daily with a meal.     dapagliflozin propanediol (FARXIGA) 10 MG TABS tablet Take 10 mg by mouth daily.     diclofenac Sodium (VOLTAREN) 1 % GEL Apply 2 g topically 3 (three) times daily as needed. 100 g 2   fluticasone (FLONASE) 50 MCG/ACT nasal spray PLACE 2 SPRAYS INTO BOTH NOSTRILS AT BEDTIME. 48 g 1   fulvestrant (FASLODEX) 250 MG/5ML injection Inject 500 mg into the muscle every 30 (thirty) days. One injection each buttock over 1-2 minutes. Warm prior to use.     Krill Oil 1000 MG CAPS Take 1,000 mg by mouth daily.     lisinopril (ZESTRIL) 5 MG tablet Take 1 tablet (5 mg total) by mouth daily. 90 tablet 3   loperamide (IMODIUM A-D) 2 MG tablet Take 2 tablets (4 mg total) by mouth 4 (four) times daily as needed for diarrhea or loose stools. 30 tablet 0   metFORMIN (GLUCOPHAGE) 500 MG tablet Take 1 tablet (500 mg total) by mouth 2 (two) times daily with a meal. 180 tablet 3   omeprazole (PRILOSEC) 40 MG capsule TAKE 1 CAPSULE BY MOUTH TWICE A DAY 180 capsule 1   ondansetron (ZOFRAN) 8 MG tablet Take 1 tablet (8 mg total) by mouth every 8 (eight) hours as needed for nausea or vomiting. 20 tablet 0   prochlorperazine (COMPAZINE) 10 MG tablet Take 1 tablet (10 mg total) by mouth every 6 (six) hours as needed for nausea or vomiting. 20 tablet 1   ribociclib succ (KISQALI, 600 MG DOSE,) 200 MG Therapy Pack Take 3 tablets (600 mg total) by mouth daily. Take for 21 days on, 7 days off, repeat every 28 days. 63 tablet 5   sodium chloride (OCEAN) 0.65 % SOLN nasal spray Place 1 spray into both nostrils as needed for congestion.     traZODone (DESYREL) 50 MG tablet Take 1 tablet (50 mg total) by mouth at bedtime as needed. for sleep 90 tablet 3   venlafaxine  XR (EFFEXOR-XR) 75 MG 24 hr capsule Take 1 capsule (75 mg total) by mouth daily with breakfast. 360 capsule 0   vitamin B-12 (CYANOCOBALAMIN) 1000 MCG tablet Take 2,500 mcg by mouth every Monday, Wednesday, and Friday.     Vitamin D, Cholecalciferol, 50 MCG (2000 UT) CAPS Take 2,000 Units by mouth every Monday, Wednesday, and Friday.     No current facility-administered medications for this visit.    VITAL SIGNS: LMP 12/22/1999 (Approximate)  There were no vitals filed for this visit.    Estimated body mass index is 29.91 kg/m as calculated from the following:   Height as of an earlier encounter on 08/12/23: 5' 5.5" (1.664 m).   Weight as of an earlier encounter on 08/12/23: 82.8 kg (182 lb 8 oz).  LABS: CBC:    Component Value Date/Time   WBC  9.3 08/12/2023 0903   HGB 10.2 (L) 08/12/2023 0903   HGB 9.1 (L) 02/18/2023 0944   HGB 15.5 07/13/2017 0937   HCT 31.6 (L) 08/12/2023 0903   HCT 46.7 (H) 07/13/2017 0937   PLT 236 08/12/2023 0903   PLT 211 02/18/2023 0944   PLT 211 07/13/2017 0937   MCV 104.6 (H) 08/12/2023 0903   MCV 94 07/13/2017 0937   MCV 93 03/11/2015 1356   NEUTROABS 5.9 08/12/2023 0903   NEUTROABS 5.4 07/13/2017 0937   NEUTROABS 5.6 03/11/2015 1356   LYMPHSABS 1.9 08/12/2023 0903   LYMPHSABS 2.1 07/13/2017 0937   LYMPHSABS 2.3 03/11/2015 1356   MONOABS 1.0 08/12/2023 0903   MONOABS 1.0 (H) 03/11/2015 1356   EOSABS 0.3 08/12/2023 0903   EOSABS 0.2 07/13/2017 0937   EOSABS 0.3 03/11/2015 1356   BASOSABS 0.1 08/12/2023 0903   BASOSABS 0.0 07/13/2017 0937   BASOSABS 0.1 03/11/2015 1356   Comprehensive Metabolic Panel:    Component Value Date/Time   NA 144 08/12/2023 0903   NA 142 07/14/2019 1516   NA 139 03/11/2015 1356   K 4.2 08/12/2023 0903   K 3.4 (L) 03/11/2015 1356   CL 105 08/12/2023 0903   CL 103 03/11/2015 1356   CO2 27 08/12/2023 0903   CO2 28 03/11/2015 1356   BUN 35 (H) 08/12/2023 0903   BUN 12 07/14/2019 1516   BUN 13 03/11/2015 1356    CREATININE 1.56 (H) 08/12/2023 0903   CREATININE 0.85 10/20/2021 0838   GLUCOSE 115 (H) 08/12/2023 0903   GLUCOSE 118 (H) 03/11/2015 1356   CALCIUM 9.4 08/12/2023 0903   CALCIUM 9.2 03/11/2015 1356   AST 27 08/12/2023 0903   ALT 23 08/12/2023 0903   ALT 32 03/11/2015 1356   ALKPHOS 77 08/12/2023 0903   ALKPHOS 68 03/11/2015 1356   BILITOT 0.5 08/12/2023 0903   PROT 7.4 08/12/2023 0903   PROT 6.9 07/14/2019 1516   PROT 6.9 03/11/2015 1356   ALBUMIN 4.0 08/12/2023 0903   ALBUMIN 4.5 07/14/2019 1516   ALBUMIN 4.2 03/11/2015 1356     Present during today's visit: patient only  Assessment and Plan: Continue to hold ribociclib and resume on 08/19/23 with previous dosing of ribociclib 600mg  21on/7 off  Patient will receive fulvestrant today.    Oral Chemotherapy Side Effect/Intolerance:  N/A ribociclib currently on hold  Oral Chemotherapy Adherence: N/A ribociclib currently on hold There are no patient barriers to medication adherence identified.   New medications: None reported  Medication Access Issues: N/A ribociclib currently on hold  Patient expressed understanding and was in agreement with this plan. She also understands that She can call clinic at any time with any questions, concerns, or complaints.   Follow-up plan: RTC in 5 weeks.   Thank you for allowing me to participate in the care of this very pleasant patient.   Time Total: 10 minutes  Visit consisted of counseling and education on dealing with issues of symptom management in the setting of serious and potentially life-threatening illness.Greater than 50%  of this time was spent counseling and coordinating care related to the above assessment and plan.  Signed by: Remi Haggard, PharmD, BCPS, Nolon Bussing, CPP Hematology/Oncology Clinical Pharmacist Practitioner [place]/DB/AP Oral Chemotherapy Navigation Clinic [phone]  08/12/2023 4:13 PM

## 2023-08-13 LAB — CANCER ANTIGEN 27.29: CA 27.29: 94.9 U/mL — ABNORMAL HIGH (ref 0.0–38.6)

## 2023-08-18 DIAGNOSIS — J301 Allergic rhinitis due to pollen: Secondary | ICD-10-CM | POA: Diagnosis not present

## 2023-08-18 DIAGNOSIS — G4733 Obstructive sleep apnea (adult) (pediatric): Secondary | ICD-10-CM | POA: Diagnosis not present

## 2023-08-18 DIAGNOSIS — H6122 Impacted cerumen, left ear: Secondary | ICD-10-CM | POA: Diagnosis not present

## 2023-08-26 ENCOUNTER — Other Ambulatory Visit: Payer: Self-pay

## 2023-09-01 ENCOUNTER — Other Ambulatory Visit: Payer: Medicare Other

## 2023-09-01 NOTE — Progress Notes (Signed)
.  Tumor Board Documentation  Cassidy Norris was presented by Ladora Daniel at our Tumor Board on 09/01/2023, which included representatives from medical oncology, neuro oncology, radiation oncology, surgical oncology, pathology, navigation.  Cassidy Norris currently presents as a new patient with history of the following treatments: surgical intervention(s) (enlarging ovarian mass that was hypermetabolic. Hx of breast cancer. Presented with female pattern baldness and facial hair. testosterone was elevated. On 08/04/23 she underwent a lap BSO with Dr. Johnnette Litter.).  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   Additionally, we reviewed previous medical and familial history, history of present illness, and recent lab results along with all available histopathologic and imaging studies. The tumor board considered available treatment options and made the following recommendations: For Leydig tumor, surveillance given benign recommendations.  - rereferral to genetics - follow up with med onc for treatment of breast cancer      The following procedures/referrals were also placed: No orders of the defined types were placed in this encounter.   Clinical Trial Status: unknown   Staging used:    National site-specific guidelines   were discussed with respect to the case.  Tumor board is a meeting of clinicians from various specialty areas who evaluate and discuss patients for whom a multidisciplinary approach is being considered. Final determinations in the plan of care are those of the provider(s). The responsibility for follow up of recommendations given during tumor board is that of the provider.   Today's extended care, comprehensive team conference, Cassidy Norris was not present for the discussion and was not examined.

## 2023-09-06 ENCOUNTER — Other Ambulatory Visit: Payer: Self-pay | Admitting: Family Medicine

## 2023-09-06 DIAGNOSIS — G4700 Insomnia, unspecified: Secondary | ICD-10-CM

## 2023-09-07 NOTE — Telephone Encounter (Signed)
Requested Prescriptions  Pending Prescriptions Disp Refills   traZODone (DESYREL) 50 MG tablet [Pharmacy Med Name: TRAZODONE 50 MG TABLET] 90 tablet 0    Sig: TAKE 1 TABLET (50 MG TOTAL) BY MOUTH AT BEDTIME AS NEEDED. FOR SLEEP     Psychiatry: Antidepressants - Serotonin Modulator Passed - 09/06/2023  5:47 PM      Passed - Valid encounter within last 6 months    Recent Outpatient Visits           1 month ago CKD (chronic kidney disease), stage IV [facility])   [place] [facility], Netta Neat, DO   4 months ago Essential hypertension, benign   [facility] [facility] Smitty Cords, DO   10 months ago Essential hypertension, benign   [place] [facility] Smitty Cords, DO   1 year ago Pruritic erythematous rash   [place] [facility], Netta Neat, DO   1 year ago Type 2 diabetes mellitus with other specified complication, without long-term current use of insulin [facility])   [place] [facility] Smitty Cords, DO       Future Appointments             In 1 month Kirke Corin, Chelsea Aus, MD [facility] [place]   In 1 month Althea Charon, Netta Neat, DO [place] [facility]   In 5 months Deirdre Evener, MD [facility]

## 2023-09-08 ENCOUNTER — Other Ambulatory Visit: Payer: Self-pay

## 2023-09-08 ENCOUNTER — Inpatient Hospital Stay: Payer: Medicare Other | Attending: Oncology | Admitting: Obstetrics and Gynecology

## 2023-09-08 ENCOUNTER — Encounter: Payer: Self-pay | Admitting: Obstetrics and Gynecology

## 2023-09-08 VITALS — BP 136/72 | HR 113 | Temp 96.8°F | Resp 20 | Wt 186.4 lb

## 2023-09-08 DIAGNOSIS — C50919 Malignant neoplasm of unspecified site of unspecified female breast: Secondary | ICD-10-CM

## 2023-09-08 DIAGNOSIS — C50912 Malignant neoplasm of unspecified site of left female breast: Secondary | ICD-10-CM | POA: Insufficient documentation

## 2023-09-08 DIAGNOSIS — Z79811 Long term (current) use of aromatase inhibitors: Secondary | ICD-10-CM | POA: Insufficient documentation

## 2023-09-08 DIAGNOSIS — Z5111 Encounter for antineoplastic chemotherapy: Secondary | ICD-10-CM | POA: Insufficient documentation

## 2023-09-08 DIAGNOSIS — Z170 Estrogen receptor positive status [ER+]: Secondary | ICD-10-CM | POA: Insufficient documentation

## 2023-09-08 DIAGNOSIS — Z87891 Personal history of nicotine dependence: Secondary | ICD-10-CM | POA: Insufficient documentation

## 2023-09-08 DIAGNOSIS — Z9013 Acquired absence of bilateral breasts and nipples: Secondary | ICD-10-CM | POA: Insufficient documentation

## 2023-09-08 DIAGNOSIS — C50911 Malignant neoplasm of unspecified site of right female breast: Secondary | ICD-10-CM | POA: Insufficient documentation

## 2023-09-08 DIAGNOSIS — Z79899 Other long term (current) drug therapy: Secondary | ICD-10-CM | POA: Insufficient documentation

## 2023-09-08 DIAGNOSIS — N838 Other noninflammatory disorders of ovary, fallopian tube and broad ligament: Secondary | ICD-10-CM

## 2023-09-08 NOTE — Progress Notes (Signed)
Gynecologic Oncology Consult Visit   Referring Provider: Dr. Orlie Dakin  Chief Complaint: Left Ovarian Leydig tumor  Subjective:  Cassidy Norris is a 72 y.o. female who is seen in consultation from Dr. Orlie Dakin for incidental PET positive left ovarian mass.   LS BSO on 08/04/23.  Patient returns today for post op evaluation. No new complaints.    FINAL DIAGNOSIS      1. 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 Ovary and fallopian tube, right,  :      - METASTATIC LOBULAR BREAST CARCINOMA INVOLVING FIMBRIATED FALLOPIAN TUBE AND      OVARY      - SEE NOTE       Diagnosis Note : - 2.The patient's clinical history of invasive ductal and      lobular carcinoma is noted.The patient's recent clinical findings of      virilization, elevated testosterone and a left ovarian mass are also      noted.Immunohistochemical stains are performed on block 1B.The Leydig tumor      cells are strongly and diffusely positive for Inhibin, Calretinin, and      Melan-A.Bilateral ovaries and fallopian tubes have extensive involvement by      metastatic lobular carcinoma.The lobular tumor cells are positive for CK AE1/AE3      and GATA3 while negative for ER.They show loss of E-cadherin staining which is      consistent with a lobular phenotype.The overall morphologic and      immunohistochemical findings of both lesions are consistent with the above      diagnoses.Clinical and radiological correlation recommended.Dr.Rubinas reviewed      the case and agrees with the above diagnoses.    Gyn history Patient diagnosed with recurrent metastatic breast cancer, metastatic to axillary LN, left supraclavicular LN, hx of bilateral mastectomy and chemo, currently on Kisqali, a CDK inhibitor, and fulvestrant, who on restaging PET on 05/03/23 she was found to have: left  ovary, mildly prominent measuring 2.8 x 2.7 with maximum SUV of 6.6. Size is similar to previous imaging but SUV was previously noted to be 3.6. No other abdominal lymphadenopathy.   Pelvic US was performed on 05/26/23 which showed:  - Uterus: Measurements: 5.8 x 3.2 x 4.7 centimeters = volume: 44.6 mL. The uterus is anteverted. Myometrium is heterogeneous. A posterior uterine fibroid is 1.7 x 1.2 x 1.4 centimeters. Nabothian cyst is present in the cervix. - Endometrium: Thickness: 2.9 millimeters.  No focal abnormality visualized. - Right ovary: Measurements: 2.3 x 1.6 x 1.3 centimeters = volume: 2.4 mL. Normal appearance/no adnexal mass. RIGHT ovary is only seen on transabdominal imaging - Left ovary: Measurements: 3.5 x 2.4 x 3.4 centimeters = volume: 15.0 mL. A heterogeneous hypoechoic mass in the central portion of the LEFT ovary is 2.7 x 2.1 x 2.7 centimeters and correlates well with the recent PET-CT. Internal blood flow is identified within this portion of the ovary. - Other findings: Trace amount free pelvic fluid  CA 125  06/10/23 8.3  CA 27.29 06/10/23 96.1 (elevated)  She has previously seen Dr Jean Rosenthal and underwent D&C hysteroscopy, and endometrial polypectomy for PMB and endometrial polyp.   07/12/2018- DIAGNOSIS:  A. ENDOMETRIAL CURETTINGS:  - STRIPS OF BENIGN ENDOCERVICAL GLANDULAR  EPITHELIUM WITH FOCAL ACUTE INFLAMMATION AND SQUAMOUS METAPLASIA.  - STRIPS OF ATROPHIC ENDOMETRIAL GLANDULAR EPITHELIUM.  - BLOOD.  - FOCUS OF NECROTIC DEBRIS.  - NEGATIVE FOR ATYPIA AND MALIGNANCY.   B.  ENDOMETRIAL POLYP:  - ENDOMETRIAL POLYP.  - NEGATIVE FOR ATYPIA AND MALIGNANCY.   09/2017- Pap - NILM  States she had a pap a few years ago that   More recently she established care with Dr Shawnie Pons at [facility] due to noticing bumps on her vagina that had enlarged. On exam, they were thought to be small inclusions on labia minora and observation was recommended.  was normal.    She has had excessive face hair and shaves since being diagnosed with MS about 20 years ago.  Says some increased with chemo in 2015 and some permanent alopecia.  Some deepening of voice.     Problem List: Patient Active Problem List   Diagnosis Date Noted   Ovarian mass, left 06/16/2023   Total knee replacement status 03/06/2022   Recurrent breast cancer (HCC) 12/30/2021   Erosive esophagitis    Polyp of descending colon    Centrilobular emphysema (HCC) 10/27/2021   S/P total knee arthroplasty, left 08/24/2021   Aortic atherosclerosis (HCC) 07/08/2020   Elevated LFTs 01/04/2020   Drug-induced myopathy 04/21/2019   Statin intolerance 07/22/2018   Multiple sclerosis (HCC) 07/22/2018   Endometrial polyp 06/27/2018   Postmenopausal bleeding 06/27/2018   Gastroesophageal reflux disease 01/20/2018   History of non anemic vitamin B12 deficiency 10/22/2017   Former smoker 09/24/2017   History of bilateral breast cancer 09/24/2017   Essential hypertension, benign 09/24/2017   Hyperlipidemia associated with type 2 diabetes mellitus (HCC) 07/13/2017   Microalbuminuria 01/12/2017   Type 2 diabetes mellitus with other specified complication (HCC) 09/02/2016   Osteoarthritis of knee 11/22/2015   Personal history of colonic polyps 10/10/2015   Family history of colon cancer 10/10/2015   Elevated fasting blood sugar 09/23/2015   Restless leg 07/22/2015   Insomnia 07/22/2015   Peripheral neuropathy due to chemotherapy (HCC) 03/19/2015   Lumbar radiculopathy 03/19/2015   Family history of cancer of digestive system     Past Medical History: Past Medical History:  Diagnosis Date   Anemia    Arthritis not really diagnosis but have pain   B12 deficiency 07/23/2016   Will check levels to determine if injections are needed again. Await results. Recent CBC at [facility]c [facility]   Blood transfusion without reported diagnosis 1983 had a miscarriage/dnc   Cancer (HCC) 12/30/2013   Multifocal disease:  pT1c,m, N0(isolated tumor cells) Er/ PR positive, Her 2 negative.   Cancer (HCC) 01/09/2014   Invasive lobular carcinoma, 2.2 cm, T2,N1 ER/PR positive, HER-2/neu negative   Cataract 2014?   Centrilobular emphysema (HCC) 10/27/2021   Complication of anesthesia 03/06/2022   Prolonged metabolism of Rocuronium during case.   Diabetes mellitus without complication (HCC)    Diffuse cystic mastopathy    Erosive esophagitis    Essential hypertension, benign 09/24/2017   Family history of malignant neoplasm of gastrointestinal tract 2012   Fractured elbow 08/28/2014   Gastroesophageal reflux disease    Hx of metabolic acidosis with increased anion gap    Increased heart rate    Multiple sclerosis (HCC)    Neuromuscular disorder (HCC)    neuropathy in bil feet - left is worse.   Obesity, unspecified    Peripheral neuropathy    Personal history of tobacco use, presenting hazards to health  Restless leg syndrome    Sleep apnea    uses CPAP   Special screening for malignant neoplasms, colon    Squamous cell carcinoma of skin 08/24/2022   SCCIS - scalp, ED&C    Past Surgical History: Past Surgical History:  Procedure Laterality Date   BREAST BIOPSY Right 1973,2001   BREAST BIOPSY Right 11/07/2013   INVASIVE MAMMARY CARCINOMA , ER/PR positive Her2 negative   BREAST BIOPSY Left 11/27/2013   invasive lobular and DCIS   BREAST SURGERY Bilateral 01/09/2014   mastectomy   cataract surgery Left 08/13/2022   CHOLECYSTECTOMY     COLONOSCOPY  2010   Dr. Evette Cristal   COLONOSCOPY WITH PROPOFOL N/A 06/11/2015   Procedure: COLONOSCOPY WITH PROPOFOL;  Surgeon: Kieth Brightly, MD;  Location: [facility] ENDOSCOPY;  Service: Endoscopy;  Laterality: N/A;   COLONOSCOPY WITH PROPOFOL N/A 12/23/2021   Procedure: COLONOSCOPY WITH PROPOFOL;  Surgeon: Toney Reil, MD;  Location: [facility] ENDOSCOPY;  Service: Gastroenterology;  Laterality: N/A;   DILATATION & CURETTAGE/HYSTEROSCOPY WITH MYOSURE N/A  07/12/2018   Procedure: DILATATION & CURETTAGE/HYSTEROSCOPY WITH MYOSURE WITH ENDOMETRIAL POLYPECTOMY;  Surgeon: Conard Novak, MD;  Location: [facility] ORS;  Service: Gynecology;  Laterality: N/A;   DILATION AND CURETTAGE OF UTERUS  1983   ESOPHAGOGASTRODUODENOSCOPY N/A 12/23/2021   Procedure: ESOPHAGOGASTRODUODENOSCOPY (EGD);  Surgeon: Toney Reil, MD;  Location: [facility] ENDOSCOPY;  Service: Gastroenterology;  Laterality: N/A;   JOINT REPLACEMENT  11/23/2015   KNEE ARTHROPLASTY Right 03/06/2022   Procedure: COMPUTER ASSISTED TOTAL KNEE ARTHROPLASTY;  Surgeon: Donato Heinz, MD;  Location: [facility] ORS;  Service: Orthopedics;  Laterality: Right;   POLYPECTOMY  1989   PORTA CATH REMOVAL     PORTACATH PLACEMENT  2015   SKIN BIOPSY     on scalp   SPINE SURGERY  09/14/2014   Ruptured disk L4- L5   TOTAL KNEE ARTHROPLASTY Left 11/22/2015   Procedure: TOTAL KNEE ARTHROPLASTY;  Surgeon: Frederico Hamman, MD;  Location: [facility] OR;  Service: Orthopedics;  Laterality: Left;   TUBAL LIGATION     WISDOM TOOTH EXTRACTION  2006    OB History:  OB History  Gravida Para Term Preterm AB Living  1       1    SAB IAB Ectopic Multiple Live Births  1            # Outcome Date GA Lbr Len/2nd Weight Sex Type Anes PTL Lv  1 SAB             Obstetric Comments  Age with first menstruation-13  Age with first pregnancy-31    Family History: Family History  Problem Relation Age of Onset   Cancer Mother        lung   COPD Mother    Cancer Father        colon, stomach   Diabetes Maternal Grandmother    Hypertension Sister    Rectal cancer Paternal Uncle    Rectal cancer Paternal Uncle     Social History: Social History   Socioeconomic History   Marital status: Widowed    Spouse name: Not on file   Number of children: Not on file   Years of education: some college   Highest education level: High school graduate  Occupational History   Occupation: retired  Tobacco Use   Smoking status:  Former    Current packs/day: 0.00    Average packs/day: 1 pack/day for 30.0 years (30.0 ttl pk-yrs)    Types: Cigarettes  Start date: 12/12/1983    Quit date: 12/11/2013    Years since quitting: 9.5   Smokeless tobacco: Former  Building services engineer status: Never Used  Substance and Sexual Activity   Alcohol use: Not Currently   Drug use: No   Sexual activity: Not Currently    Birth control/protection: Post-menopausal  Other Topics Concern   Not on file  Social History Narrative   Right handed   Drinks caffeine   One story home   Social Determinants of Health   Financial Resource Strain: Low Risk  (05/09/2019)   Overall Financial Resource Strain (CARDIA)    Difficulty of Paying Living Expenses: Not hard at all  Food Insecurity: No Food Insecurity (05/09/2019)   Hunger Vital Sign    Worried About Running Out of Food in the Last Year: Never true    Ran Out of Food in the Last Year: Never true  Transportation Needs: No Transportation Needs (05/09/2019)   PRAPARE - Administrator, Civil Service (Medical): No    Lack of Transportation (Non-Medical): No  Physical Activity: Inactive (05/09/2019)   Exercise Vital Sign    Days of Exercise per Week: 0 days    Minutes of Exercise per Session: 0 min  Stress: Not on file  Social Connections: Not on file  Intimate Partner Violence: Not on file    Allergies: No Known Allergies  Current Medications: Current Outpatient Medications  Medication Sig Dispense Refill   acetaminophen (TYLENOL) 500 MG tablet Take 1,000 mg by mouth 2 (two) times daily as needed for moderate pain or headache.     baclofen (LIORESAL) 10 MG tablet Take 1 tablet (10 mg total) by mouth 3 (three) times daily as needed for muscle spasms. 1080 tablet 0   calcium carbonate (OSCAL) 1500 (600 Ca) MG TABS tablet Take by mouth 2 (two) times daily with a meal.     diclofenac Sodium (VOLTAREN) 1 % GEL Apply 2 g topically 3 (three) times daily as needed. 100 g 2    fluticasone (FLONASE) 50 MCG/ACT nasal spray PLACE 2 SPRAYS INTO BOTH NOSTRILS AT BEDTIME. 48 g 1   fulvestrant (FASLODEX) 250 MG/5ML injection Inject 500 mg into the muscle every 30 (thirty) days. One injection each buttock over 1-2 minutes. Warm prior to use.     Krill Oil 1000 MG CAPS Take 1,000 mg by mouth daily.     lisinopril (ZESTRIL) 5 MG tablet Take 1 tablet (5 mg total) by mouth daily. 90 tablet 3   loperamide (IMODIUM A-D) 2 MG tablet Take 2 tablets (4 mg total) by mouth 4 (four) times daily as needed for diarrhea or loose stools. 30 tablet 0   metFORMIN (GLUCOPHAGE) 500 MG tablet Take 1 tablet (500 mg total) by mouth 2 (two) times daily with a meal. 180 tablet 3   mometasone (ELOCON) 0.1 % cream Apply to affected skin qd-bid prn flares 45 g 2   omeprazole (PRILOSEC) 40 MG capsule Take 1 capsule (40 mg total) by mouth 2 (two) times daily. 60 capsule 2   ondansetron (ZOFRAN) 8 MG tablet Take 1 tablet (8 mg total) by mouth every 8 (eight) hours as needed for nausea or vomiting. 20 tablet 0   prochlorperazine (COMPAZINE) 10 MG tablet Take 1 tablet (10 mg total) by mouth every 6 (six) hours as needed for nausea or vomiting. 20 tablet 1   ribociclib succ (KISQALI, 600 MG DOSE,) 200 MG Therapy Pack Take 3 tablets (600 mg total)  by mouth daily. Take for 21 days on, 7 days off, repeat every 28 days. 63 tablet 5   sodium chloride (OCEAN) 0.65 % SOLN nasal spray Place 1 spray into both nostrils as needed for congestion.     traZODone (DESYREL) 50 MG tablet Take 1 tablet (50 mg total) by mouth at bedtime as needed. for sleep 90 tablet 3   venlafaxine XR (EFFEXOR-XR) 75 MG 24 hr capsule Take 1 capsule (75 mg total) by mouth daily with breakfast. 360 capsule 0   vitamin B-12 (CYANOCOBALAMIN) 1000 MCG tablet Take 2,500 mcg by mouth every Monday, Wednesday, and Friday.     Vitamin D, Cholecalciferol, 50 MCG (2000 UT) CAPS Take 2,000 Units by mouth every Monday, Wednesday, and Friday.     No current  facility-administered medications for this visit.    Review of Systems General: positive for weight gain, generalized weakness Skin: negative for changes in color, texture, moles or lesions Eyes: negative for, changes in vision, pain, diplopia HEENT: negative for, change in hearing, pain, discharge, vertigo, voice changes, sore throat, neck masses. Positive for tinnitus and vision changes.  Pulmonary: negative for, dyspnea, orthopnea, productive cough Cardiac: negative for, palpitations, syncope, pain, discomfort, pressure. Positive for claudication Gastrointestinal: positive for nausea, vomiting, diarrhea, black stools Genitourinary/Sexual: positive for incomplete emptying of bladder Musculoskeletal: positive for pain of legs, joints, and back Hematology: positive for easy bruising Neurologic/Psych:positive for numbness  Objective:  Physical Examination:  BP 109/69   Pulse (!) 119   Temp 97.6 F (36.4 C)   Resp 19   Wt 183 lb 6.4 oz (83.2 kg)   LMP 12/22/1999 (Approximate)   SpO2 99%   BMI 30.52 kg/m     ECOG Performance Status: 1 - Symptomatic but completely ambulatory  General appearance: no acute distress. Alopecia. Facial hair HEENT: sclera clear Neck: no thyroid enlargement or cervical adenopathy Lymph node survey: non-palpable, axillary, inguinal, supraclavicular Cardiovascular: without murmurs, rubs or gallops Respiratory: clear to auscultation Abdomen: without hepatosplenomegaly, incisions healing well. Back: inspection of back is normal Extremities: no lower extremity edema Skin exam - normal coloration and turgor, no rashes, no suspicious skin lesions noted. Neurological exam reveals alert, oriented, normal speech, no focal findings or movement disorder noted.  Pelvic: deferred   Lab Results  Component Value Date   WBC 4.1 06/10/2023   HGB 9.9 (L) 06/10/2023   HCT 28.4 (L) 06/10/2023   MCV 106.8 (H) 06/10/2023   PLT 206 06/10/2023     Chemistry       Component Value Date/Time   NA 135 06/10/2023 0946   NA 142 07/14/2019 1516   NA 139 03/11/2015 1356   K 3.5 06/10/2023 0946   K 3.4 (L) 03/11/2015 1356   CL 101 06/10/2023 0946   CL 103 03/11/2015 1356   CO2 21 (L) 06/10/2023 0946   CO2 28 03/11/2015 1356   BUN 28 (H) 06/10/2023 0946   BUN 12 07/14/2019 1516   BUN 13 03/11/2015 1356   CREATININE 1.85 (H) 06/10/2023 0946   CREATININE 0.85 10/20/2021 0838      Component Value Date/Time   CALCIUM 9.1 06/10/2023 0946   CALCIUM 9.2 03/11/2015 1356   ALKPHOS 68 06/10/2023 0946   ALKPHOS 68 03/11/2015 1356   AST 32 06/10/2023 0946   ALT 19 06/10/2023 0946   ALT 32 03/11/2015 1356   BILITOT 0.4 06/10/2023 0946     Radiographic Imaging:  Imaging reviewed per HPI, Assessment, and Plan     Assessment:  Cassidy Norris is a 72 y.o. female diagnosed with metastatic breast cancer and persistent small solid/heterogenous left ovarian mass, 3 cm.  She is taking cdk inhibitor and fulvestrant (since 2/23) for metastatic breast cancer, and this is in good control with response of supraclavicular nodal disease on serial PET scans.  She was referred because of concern that the SUV of the left ovarian mass has increased from 3.6 last year to 6.6 recently.  However, I reviewed these scans with the radiologist and he is not certain there has actually been a change.  The two PET scans were done with different machines.  Pelvic US did confirm the presence of a small mass 2.7 cm in the left ovary.  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  06/16/23 Testosterone highly elevated to 239, free T elevated 0.8%. AFP, inhibin A/B, CA125 normal.  Discussed with Dr Doloris Hall of [facility] and he did not think the elevated testosterone is due to Fulvestrant.  She has a small left ovarian mass and the symptoms of virilization  antedate when she started fulvestrant. So recommend surgical removal of the ovarian tumor.   LS BSO on 08/04/23 showed Leydig tumor in left ovary and metastatic breast cancer involving both tubes and ovaries that was not grossly apparent at surgery.   History of PMB 2019 with polyp on D&C.  No PMB now.  Normal PAPs.  History of tubal ligation.   Vulvar sebaceous cysts.  Medical co-morbidities complicating care: breast cancer CKD, type 2 diabetes, MS, GERD, aortic atherosclerosis, emphysema and others.  Plan:   Problem List Items Addressed This Visit       Other   Ovarian mass, left - Primary   Normal post op exam today.  She will continue follow up with Dr Orlie Dakin for breast cancer treatment.  Informed her that genetics referral is recommended for updated germline testing and she is in agreement.   She can RTC if needed in the future.    Leida Lauth, MD

## 2023-09-15 ENCOUNTER — Ambulatory Visit: Payer: Medicare Other | Admitting: Dermatology

## 2023-09-16 ENCOUNTER — Inpatient Hospital Stay: Payer: Medicare Other

## 2023-09-16 ENCOUNTER — Inpatient Hospital Stay: Payer: Medicare Other | Admitting: Pharmacist

## 2023-09-16 ENCOUNTER — Encounter: Payer: Self-pay | Admitting: Oncology

## 2023-09-16 ENCOUNTER — Inpatient Hospital Stay (HOSPITAL_BASED_OUTPATIENT_CLINIC_OR_DEPARTMENT_OTHER): Payer: Medicare Other | Admitting: Oncology

## 2023-09-16 VITALS — BP 125/72 | HR 95 | Temp 97.1°F | Resp 16 | Ht 65.5 in | Wt 184.7 lb

## 2023-09-16 DIAGNOSIS — Z9013 Acquired absence of bilateral breasts and nipples: Secondary | ICD-10-CM | POA: Diagnosis not present

## 2023-09-16 DIAGNOSIS — Z5111 Encounter for antineoplastic chemotherapy: Secondary | ICD-10-CM | POA: Diagnosis not present

## 2023-09-16 DIAGNOSIS — C50911 Malignant neoplasm of unspecified site of right female breast: Secondary | ICD-10-CM | POA: Diagnosis not present

## 2023-09-16 DIAGNOSIS — Z79811 Long term (current) use of aromatase inhibitors: Secondary | ICD-10-CM | POA: Diagnosis not present

## 2023-09-16 DIAGNOSIS — N838 Other noninflammatory disorders of ovary, fallopian tube and broad ligament: Secondary | ICD-10-CM

## 2023-09-16 DIAGNOSIS — Z87891 Personal history of nicotine dependence: Secondary | ICD-10-CM | POA: Diagnosis not present

## 2023-09-16 DIAGNOSIS — C50919 Malignant neoplasm of unspecified site of unspecified female breast: Secondary | ICD-10-CM

## 2023-09-16 DIAGNOSIS — Z170 Estrogen receptor positive status [ER+]: Secondary | ICD-10-CM | POA: Diagnosis not present

## 2023-09-16 DIAGNOSIS — Z79899 Other long term (current) drug therapy: Secondary | ICD-10-CM | POA: Diagnosis not present

## 2023-09-16 DIAGNOSIS — C50912 Malignant neoplasm of unspecified site of left female breast: Secondary | ICD-10-CM | POA: Diagnosis not present

## 2023-09-16 LAB — CMP (CANCER CENTER ONLY)
ALT: 20 U/L (ref 0–44)
AST: 28 U/L (ref 15–41)
Albumin: 4 g/dL (ref 3.5–5.0)
Alkaline Phosphatase: 77 U/L (ref 38–126)
Anion gap: 9 (ref 5–15)
BUN: 26 mg/dL — ABNORMAL HIGH (ref 8–23)
CO2: 26 mmol/L (ref 22–32)
Calcium: 9.2 mg/dL (ref 8.9–10.3)
Chloride: 105 mmol/L (ref 98–111)
Creatinine: 1.62 mg/dL — ABNORMAL HIGH (ref 0.44–1.00)
GFR, Estimated: 34 mL/min — ABNORMAL LOW (ref >60)
Glucose, Bld: 114 mg/dL — ABNORMAL HIGH (ref 70–99)
Potassium: 3.8 mmol/L (ref 3.5–5.1)
Sodium: 140 mmol/L (ref 135–145)
Total Bilirubin: 0.4 mg/dL (ref 0.3–1.2)
Total Protein: 7.3 g/dL (ref 6.5–8.1)

## 2023-09-16 LAB — CBC WITH DIFFERENTIAL/PLATELET
Abs Immature Granulocytes: 0.01 10*3/uL (ref 0.00–0.07)
Basophils Absolute: 0.1 10*3/uL (ref 0.0–0.1)
Basophils Relative: 2 %
Eosinophils Absolute: 0.1 10*3/uL (ref 0.0–0.5)
Eosinophils Relative: 1 %
HCT: 29.1 % — ABNORMAL LOW (ref 36.0–46.0)
Hemoglobin: 9.5 g/dL — ABNORMAL LOW (ref 12.0–15.0)
Immature Granulocytes: 0 %
Lymphocytes Relative: 33 %
Lymphs Abs: 1.4 10*3/uL (ref 0.7–4.0)
MCH: 33.6 pg (ref 26.0–34.0)
MCHC: 32.6 g/dL (ref 30.0–36.0)
MCV: 102.8 fL — ABNORMAL HIGH (ref 80.0–100.0)
Monocytes Absolute: 0.8 10*3/uL (ref 0.1–1.0)
Monocytes Relative: 19 %
Neutro Abs: 1.9 10*3/uL (ref 1.7–7.7)
Neutrophils Relative %: 45 %
Platelets: 194 10*3/uL (ref 150–400)
RBC: 2.83 MIL/uL — ABNORMAL LOW (ref 3.87–5.11)
RDW: 16 % — ABNORMAL HIGH (ref 11.5–15.5)
WBC: 4.2 10*3/uL (ref 4.0–10.5)
nRBC: 0 % (ref 0.0–0.2)

## 2023-09-16 LAB — MAGNESIUM: Magnesium: 2 mg/dL (ref 1.7–2.4)

## 2023-09-16 MED ORDER — FULVESTRANT 250 MG/5ML IM SOSY
500.0000 mg | PREFILLED_SYRINGE | Freq: Once | INTRAMUSCULAR | Status: AC
  Administered 2023-09-16: 500 mg via INTRAMUSCULAR
  Filled 2023-09-16: qty 10

## 2023-09-16 NOTE — Progress Notes (Signed)
Oral Chemotherapy Clinic [facility]  [phone] [phone]  Patient Care Team: Smitty Cords, DO as PCP - General (Family Medicine) Drema Dallas, DO as Consulting Physician (Neurology) Jeralyn Ruths, MD as Consulting Physician (Oncology) Benita Gutter, RN as Oncology Nurse Navigator Isla Pence, [place] (Optometry)   Name of the patient: Cassidy Norris  [id number]  04-13-1951   Date of visit: 09/16/23  HPI: Patient is a 72 y.o. female with recurrent breast cancer, ER/PR positive/HER2 negative. Currently treating with Kisqali (ribociclib) and fulvestrant. She started ribociclib on 01/08/22. Her treatment was held for surgery which was completed on 08/04/23. Patient will resume her ribociclib on 08/19/23.  Reason for Consult: Oral chemotherapy follow-up for ribociclib therapy.   PAST MEDICAL HISTORY: Past Medical History:  Diagnosis Date   Anemia    Arthritis not really diagnosis but have pain   B12 deficiency 07/23/2016   Will check levels to determine if injections are needed again. Await results. Recent CBC at Onc [facility]NL   Blood transfusion without reported diagnosis 1983 had a miscarriage/dnc   Cancer (HCC) 12/30/2013   Multifocal disease: pT1c,m, N0(isolated tumor cells) Er/ PR positive, Her 2 negative.   Cancer (HCC) 01/09/2014   Invasive lobular carcinoma, 2.2 cm, T2,N1 ER/PR positive, HER-2/neu negative   Cataract 2014?   Centrilobular emphysema (HCC) 10/27/2021   CKD (chronic kidney disease), stage IV (HCC) 07/13/2023   Complication of anesthesia 03/06/2022   Prolonged metabolism of Rocuronium during case.   Diabetes mellitus without complication (HCC)    Diffuse cystic mastopathy    Erosive esophagitis    Essential hypertension, benign 09/24/2017   Family history of malignant neoplasm of gastrointestinal tract 2012   Fractured elbow 08/28/2014   Gastroesophageal reflux disease    Hx of metabolic acidosis with  increased anion gap    Increased heart rate    Multiple sclerosis (HCC)    Neuromuscular disorder (HCC)    neuropathy in bil feet - left is worse.   Obesity, unspecified    Peripheral neuropathy    Personal history of tobacco use, presenting hazards to health    Restless leg syndrome    Sleep apnea    uses CPAP   Special screening for malignant neoplasms, colon    Squamous cell carcinoma of skin 08/24/2022   SCCIS - scalp, ED&C   Squamous cell carcinoma of skin 07/20/2023   right of midline ant scalp post, tx with ED&C   Squamous cell carcinoma of skin 07/20/2023   right of midline ant scalp ant, tx with [facility]    HEMATOLOGY/ONCOLOGY HISTORY:  Oncology History Overview Note  1. Bilateral carcinoma of breast, February, 2015. Right breast status post radical mastectomy. T1 C. N0M0 (isolated tumor cells in one lymph node) multifocal invasive cancer. Stage IC, Left breast, Status post radical mastectomy, T2, N1, M0 tumor. Stage II, RIght breast. Both tumors are estrogen receptor positive.  Progesterone receptor positive.  HER-2 receptor negative by FISH. Negative for BRCA mutation.  2. Started on Adriamycin and Cytoxan on March 01, 2014. Last cycle of Cytoxan and Adriamycin on May 01, 2014. Patient has finished last chemotherapy on September 9. (12 th dose was omitted because of neuropathy) 3.  Taking tamoxifen.  Patient had a poor tolerance to letrozole.(October, 2015) 4.  Patient is taking letrozole since November of 2015 5.  May, 2016 Letrozole has been put on hold because of bony pains 6.  Started on Aromasin from July of 2016  History of bilateral breast cancer (Resolved)  11/29/2013 Initial Diagnosis   Breast cancer bilateral, multifocal ER/PR pos, Her 2 neg,.     ALLERGIES:  has No Known Allergies.  MEDICATIONS:  Current Outpatient Medications  Medication Sig Dispense Refill   acetaminophen (TYLENOL) 500 MG tablet Take 1,000 mg by mouth 2 (two) times daily as needed for  moderate pain or headache.     baclofen (LIORESAL) 10 MG tablet Take 1 tablet (10 mg total) by mouth 3 (three) times daily as needed for muscle spasms. 1080 tablet 0   calcium carbonate (OSCAL) 1500 (600 Ca) MG TABS tablet Take by mouth 2 (two) times daily with a meal.     dapagliflozin propanediol (FARXIGA) 10 MG TABS tablet Take 10 mg by mouth daily.     diclofenac Sodium (VOLTAREN) 1 % GEL Apply 2 g topically 3 (three) times daily as needed. 100 g 2   fluticasone (FLONASE) 50 MCG/ACT nasal spray PLACE 2 SPRAYS INTO BOTH NOSTRILS AT BEDTIME. 48 g 1   fulvestrant (FASLODEX) 250 MG/5ML injection Inject 500 mg into the muscle every 30 (thirty) days. One injection each buttock over 1-2 minutes. Warm prior to use.     Krill Oil 1000 MG CAPS Take 1,000 mg by mouth daily.     lisinopril (ZESTRIL) 5 MG tablet Take 1 tablet (5 mg total) by mouth daily. 90 tablet 3   loperamide (IMODIUM A-D) 2 MG tablet Take 2 tablets (4 mg total) by mouth 4 (four) times daily as needed for diarrhea or loose stools. 30 tablet 0   metFORMIN (GLUCOPHAGE) 500 MG tablet Take 1 tablet (500 mg total) by mouth 2 (two) times daily with a meal. 180 tablet 3   omeprazole (PRILOSEC) 40 MG capsule TAKE 1 CAPSULE BY MOUTH TWICE A DAY 180 capsule 1   ondansetron (ZOFRAN) 8 MG tablet Take 1 tablet (8 mg total) by mouth every 8 (eight) hours as needed for nausea or vomiting. 20 tablet 0   prochlorperazine (COMPAZINE) 10 MG tablet Take 1 tablet (10 mg total) by mouth every 6 (six) hours as needed for nausea or vomiting. 20 tablet 1   ribociclib succ (KISQALI, 600 MG DOSE,) 200 MG Therapy Pack Take 3 tablets (600 mg total) by mouth daily. Take for 21 days on, 7 days off, repeat every 28 days. 63 tablet 5   sodium chloride (OCEAN) 0.65 % SOLN nasal spray Place 1 spray into both nostrils as needed for congestion.     traZODone (DESYREL) 50 MG tablet TAKE 1 TABLET (50 MG TOTAL) BY MOUTH AT BEDTIME AS NEEDED. FOR SLEEP 90 tablet 0   venlafaxine  XR (EFFEXOR-XR) 75 MG 24 hr capsule Take 1 capsule (75 mg total) by mouth daily with breakfast. 360 capsule 0   vitamin B-12 (CYANOCOBALAMIN) 1000 MCG tablet Take 2,500 mcg by mouth every Monday, Wednesday, and Friday.     Vitamin D, Cholecalciferol, 50 MCG (2000 UT) CAPS Take 2,000 Units by mouth every Monday, Wednesday, and Friday.     No current facility-administered medications for this visit.    VITAL SIGNS: LMP 12/22/1999 (Approximate)  There were no vitals filed for this visit.    Estimated body mass index is 30.27 kg/m as calculated from the following:   Height as of an earlier encounter on 09/16/23: 5' 5.5" (1.664 m).   Weight as of an earlier encounter on 09/16/23: 83.8 kg (184 lb 11.2 oz).  LABS: CBC:    Component Value Date/Time   WBC  4.2 09/16/2023 0856   HGB 9.5 (L) 09/16/2023 0856   HGB 9.1 (L) 02/18/2023 0944   HGB 15.5 07/13/2017 0937   HCT 29.1 (L) 09/16/2023 0856   HCT 46.7 (H) 07/13/2017 0937   PLT 194 09/16/2023 0856   PLT 211 02/18/2023 0944   PLT 211 07/13/2017 0937   MCV 102.8 (H) 09/16/2023 0856   MCV 94 07/13/2017 0937   MCV 93 03/11/2015 1356   NEUTROABS 1.9 09/16/2023 0856   NEUTROABS 5.4 07/13/2017 0937   NEUTROABS 5.6 03/11/2015 1356   LYMPHSABS 1.4 09/16/2023 0856   LYMPHSABS 2.1 07/13/2017 0937   LYMPHSABS 2.3 03/11/2015 1356   MONOABS 0.8 09/16/2023 0856   MONOABS 1.0 (H) 03/11/2015 1356   EOSABS 0.1 09/16/2023 0856   EOSABS 0.2 07/13/2017 0937   EOSABS 0.3 03/11/2015 1356   BASOSABS 0.1 09/16/2023 0856   BASOSABS 0.0 07/13/2017 0937   BASOSABS 0.1 03/11/2015 1356   Comprehensive Metabolic Panel:    Component Value Date/Time   NA 140 09/16/2023 0856   NA 142 07/14/2019 1516   NA 139 03/11/2015 1356   K 3.8 09/16/2023 0856   K 3.4 (L) 03/11/2015 1356   CL 105 09/16/2023 0856   CL 103 03/11/2015 1356   CO2 26 09/16/2023 0856   CO2 28 03/11/2015 1356   BUN 26 (H) 09/16/2023 0856   BUN 12 07/14/2019 1516   BUN 13 03/11/2015  1356   CREATININE 1.62 (H) 09/16/2023 0856   CREATININE 0.85 10/20/2021 0838   GLUCOSE 114 (H) 09/16/2023 0856   GLUCOSE 118 (H) 03/11/2015 1356   CALCIUM 9.2 09/16/2023 0856   CALCIUM 9.2 03/11/2015 1356   AST 28 09/16/2023 0856   ALT 20 09/16/2023 0856   ALT 32 03/11/2015 1356   ALKPHOS 77 09/16/2023 0856   ALKPHOS 68 03/11/2015 1356   BILITOT 0.4 09/16/2023 0856   PROT 7.3 09/16/2023 0856   PROT 6.9 07/14/2019 1516   PROT 6.9 03/11/2015 1356   ALBUMIN 4.0 09/16/2023 0856   ALBUMIN 4.5 07/14/2019 1516   ALBUMIN 4.2 03/11/2015 1356     Present during today's visit: patient only  Assessment and Plan: CBC/CMP reviewed, continue ribociclib 600mg  21on/7 off  Patient will receive fulvestrant today.    Oral Chemotherapy Side Effect/Intolerance:  No reported nausea or fatigue  Oral Chemotherapy Adherence: No missed doses in the last cycle reported There are no patient barriers to medication adherence identified.   New medications: None reported  Medication Access Issues: No issues, patient on mfg assistance  Patient expressed understanding and was in agreement with this plan. She also understands that She can call clinic at any time with any questions, concerns, or complaints.   Follow-up plan: RTC in 4 weeks.   Thank you for allowing me to participate in the care of this very pleasant patient.   Time Total: 15 minutes  Visit consisted of counseling and education on dealing with issues of symptom management in the setting of serious and potentially life-threatening illness.Greater than 50%  of this time was spent counseling and coordinating care related to the above assessment and plan.  Signed by: Remi Haggard, PharmD, BCPS, Nolon Bussing, CPP Hematology/Oncology Clinical Pharmacist Practitioner [place]/DB/AP Oral Chemotherapy Navigation Clinic [phone]  09/16/2023 11:59 AM

## 2023-09-16 NOTE — Progress Notes (Signed)
[facility]  [phone]  [phone]     ZENDAYA EICHORN DOB: 1951/08/15  MR#: [id number]  JJO#:[id number]  Patient Care Team: Smitty Cords, DO as PCP - General (Family Medicine) Drema Dallas, DO as Consulting Physician (Neurology) Jeralyn Ruths, MD as Consulting Physician (Oncology) Benita Gutter, RN as Oncology Nurse Navigator Isla Pence, OD (Optometry)  CHIEF COMPLAINT: Recurrent, metastatic ER/PR, HER2 negative invasive carcinoma of the breast.    INTERVAL HISTORY: Patient returns to clinic today for further evaluation and continuation of Kisqali and fulvestrant.  She currently feels well and is asymptomatic.  She does not complain of weakness or fatigue today.  She denies any pain.  She has no new neurologic complaints.  She denies any recent fevers or illnesses.  She has a fair appetite and denies weight loss.  She has no chest pain, shortness of breath, cough, or hemoptysis.  She denies any nausea, vomiting, constipation, or diarrhea.  She has no urinary complaints.  Patient offers no specific complaints today.  REVIEW OF SYSTEMS:   Review of Systems  Constitutional: Negative.  Negative for fever, malaise/fatigue and weight loss.  Respiratory: Negative.  Negative for cough, hemoptysis and shortness of breath.   Cardiovascular: Negative.  Negative for chest pain and leg swelling.  Gastrointestinal: Negative.  Negative for abdominal pain.  Genitourinary: Negative.  Negative for dysuria.  Musculoskeletal: Negative.  Negative for joint pain and myalgias.  Skin: Negative.  Negative for rash.  Neurological: Negative.  Negative for dizziness, sensory change, focal weakness, weakness and headaches.  Psychiatric/Behavioral: Negative.  The patient is not nervous/anxious.     As per HPI. Otherwise, a complete review of systems is negative.  ONCOLOGY HISTORY: Oncology History Overview Note  1. Bilateral carcinoma of breast,  February, 2015. Right breast status post radical mastectomy. T1 C. N0M0 (isolated tumor cells in one lymph node) multifocal invasive cancer. Stage IC, Left breast, Status post radical mastectomy, T2, N1, M0 tumor. Stage II, RIght breast. Both tumors are estrogen receptor positive.  Progesterone receptor positive.  HER-2 receptor negative by FISH. Negative for BRCA mutation.  2. Started on Adriamycin and Cytoxan on March 01, 2014. Last cycle of Cytoxan and Adriamycin on May 01, 2014. Patient has finished last chemotherapy on September 9. (12 th dose was omitted because of neuropathy) 3.  Taking tamoxifen.  Patient had a poor tolerance to letrozole.(October, 2015) 4.  Patient is taking letrozole since November of 2015 5.  May, 2016 Letrozole has been put on hold because of bony pains 6.  Started on Aromasin from July of 2016    History of bilateral breast cancer (Resolved)  11/29/2013 Initial Diagnosis   Breast cancer bilateral, multifocal ER/PR pos, Her 2 neg,.     PAST MEDICAL HISTORY: Past Medical History:  Diagnosis Date   Anemia    Arthritis not really diagnosis but have pain   B12 deficiency 07/23/2016   Will check levels to determine if injections are needed again. Await results. Recent CBC at Onc WNL   Blood transfusion without reported diagnosis 1983 had a miscarriage/dnc   Cancer (HCC) 12/30/2013   Multifocal disease: pT1c,m, N0(isolated tumor cells) Er/ PR positive, Her 2 negative.   Cancer (HCC) 01/09/2014   Invasive lobular carcinoma, 2.2 cm, T2,N1 ER/PR positive, HER-2/neu negative   Cataract 2014?   Centrilobular emphysema (HCC) 10/27/2021   CKD (chronic kidney disease), stage IV (HCC) 07/13/2023   Complication of anesthesia 03/06/2022   Prolonged metabolism  of Rocuronium during case.   Diabetes mellitus without complication (HCC)    Diffuse cystic mastopathy    Erosive esophagitis    Essential hypertension, benign 09/24/2017   Family history of malignant neoplasm  of gastrointestinal tract 2012   Fractured elbow 08/28/2014   Gastroesophageal reflux disease    Hx of metabolic acidosis with increased anion gap    Increased heart rate    Multiple sclerosis (HCC)    Neuromuscular disorder (HCC)    neuropathy in bil feet - left is worse.   Obesity, unspecified    Peripheral neuropathy    Personal history of tobacco use, presenting hazards to health    Restless leg syndrome    Sleep apnea    uses CPAP   Special screening for malignant neoplasms, colon    Squamous cell carcinoma of skin 08/24/2022   SCCIS - scalp, ED&C   Squamous cell carcinoma of skin 07/20/2023   right of midline ant scalp post, tx with ED&C   Squamous cell carcinoma of skin 07/20/2023   right of midline ant scalp ant, tx with ED&C    PAST SURGICAL HISTORY: Past Surgical History:  Procedure Laterality Date   BREAST BIOPSY Right 1973,2001   BREAST BIOPSY Right 11/07/2013   INVASIVE MAMMARY CARCINOMA , ER/PR positive Her2 negative   BREAST BIOPSY Left 11/27/2013   invasive lobular and DCIS   BREAST SURGERY Bilateral 01/09/2014   mastectomy   cataract surgery Left 08/13/2022   CHOLECYSTECTOMY     COLONOSCOPY  2010   Dr. Evette Cristal   COLONOSCOPY WITH PROPOFOL N/A 06/11/2015   Procedure: COLONOSCOPY WITH PROPOFOL;  Surgeon: Kieth Brightly, MD;  Location: [facility] ENDOSCOPY;  Service: Endoscopy;  Laterality: N/A;   COLONOSCOPY WITH PROPOFOL N/A 12/23/2021   Procedure: COLONOSCOPY WITH PROPOFOL;  Surgeon: Toney Reil, MD;  Location: [facility] ENDOSCOPY;  Service: Gastroenterology;  Laterality: N/A;   DILATATION & CURETTAGE/HYSTEROSCOPY WITH MYOSURE N/A 07/12/2018   Procedure: DILATATION & CURETTAGE/HYSTEROSCOPY WITH MYOSURE WITH ENDOMETRIAL POLYPECTOMY;  Surgeon: Conard Novak, MD;  Location: [facility] ORS;  Service: Gynecology;  Laterality: N/A;   DILATION AND CURETTAGE OF UTERUS  1983   ESOPHAGOGASTRODUODENOSCOPY N/A 12/23/2021   Procedure: ESOPHAGOGASTRODUODENOSCOPY  (EGD);  Surgeon: Toney Reil, MD;  Location[facility] ENDOSCOPY;  Service: Gastroenterology;  Laterality: N/A;   JOINT REPLACEMENT Left 11/23/2015   right  2023   KNEE ARTHROPLASTY Right 03/06/2022   Procedure: COMPUTER ASSISTED TOTAL KNEE ARTHROPLASTY;  Surgeon: Donato Heinz, MD;  Location: [facility] ORS;  Service: Orthopedics;  Laterality: Right;   LAPAROSCOPIC BILATERAL SALPINGO OOPHERECTOMY N/A 08/04/2023   Procedure: LAPAROSCOPIC BILATERAL SALPINGO OOPHORECTOMY;  Surgeon: Leida Lauth, MD;  Location: [facility] ORS;  Service: Gynecology;  Laterality: N/A;   POLYPECTOMY  1989   PORTA CATH REMOVAL     PORTACATH PLACEMENT  2015   SKIN BIOPSY     on scalp   SPINE SURGERY  09/14/2014   Ruptured disk L4- L5   TOTAL KNEE ARTHROPLASTY Left 11/22/2015   Procedure: TOTAL KNEE ARTHROPLASTY;  Surgeon: Frederico Hamman, MD;  Location[facility];  Service: Orthopedics;  Laterality: Left;   TUBAL LIGATION     WISDOM TOOTH EXTRACTION  2006    FAMILY HISTORY Family History  Problem Relation Age of Onset   Cancer Mother        lung   COPD Mother    Cancer Father        colon, stomach   Diabetes Maternal Grandmother    Hypertension Sister  Rectal cancer Paternal Uncle    Rectal cancer Paternal Uncle     GYNECOLOGIC HISTORY:  Patient's last menstrual period was 12/22/1999 (approximate).     ADVANCED DIRECTIVES:    HEALTH MAINTENANCE: Social History   Tobacco Use   Smoking status: Former    Current packs/day: 0.00    Average packs/day: 1 pack/day for 30.0 years (30.0 ttl pk-yrs)    Types: Cigarettes    Start date: 12/12/1983    Quit date: 12/11/2013    Years since quitting: 9.7   Smokeless tobacco: Former  Building services engineer status: Never Used  Substance Use Topics   Alcohol use: Not Currently   Drug use: No     Colonoscopy:  PAP:  Bone density:  Mammogram:  No Known Allergies  Current Outpatient Medications  Medication Sig Dispense Refill   acetaminophen (TYLENOL) 500  MG tablet Take 1,000 mg by mouth 2 (two) times daily as needed for moderate pain or headache.     baclofen (LIORESAL) 10 MG tablet Take 1 tablet (10 mg total) by mouth 3 (three) times daily as needed for muscle spasms. 1080 tablet 0   calcium carbonate (OSCAL) 1500 (600 Ca) MG TABS tablet Take by mouth 2 (two) times daily with a meal.     dapagliflozin propanediol (FARXIGA) 10 MG TABS tablet Take 10 mg by mouth daily.     diclofenac Sodium (VOLTAREN) 1 % GEL Apply 2 g topically 3 (three) times daily as needed. 100 g 2   fluticasone (FLONASE) 50 MCG/ACT nasal spray PLACE 2 SPRAYS INTO BOTH NOSTRILS AT BEDTIME. 48 g 1   fulvestrant (FASLODEX) 250 MG/5ML injection Inject 500 mg into the muscle every 30 (thirty) days. One injection each buttock over 1-2 minutes. Warm prior to use.     Krill Oil 1000 MG CAPS Take 1,000 mg by mouth daily.     lisinopril (ZESTRIL) 5 MG tablet Take 1 tablet (5 mg total) by mouth daily. 90 tablet 3   loperamide (IMODIUM A-D) 2 MG tablet Take 2 tablets (4 mg total) by mouth 4 (four) times daily as needed for diarrhea or loose stools. 30 tablet 0   metFORMIN (GLUCOPHAGE) 500 MG tablet Take 1 tablet (500 mg total) by mouth 2 (two) times daily with a meal. 180 tablet 3   omeprazole (PRILOSEC) 40 MG capsule TAKE 1 CAPSULE BY MOUTH TWICE A DAY 180 capsule 1   ondansetron (ZOFRAN) 8 MG tablet Take 1 tablet (8 mg total) by mouth every 8 (eight) hours as needed for nausea or vomiting. 20 tablet 0   prochlorperazine (COMPAZINE) 10 MG tablet Take 1 tablet (10 mg total) by mouth every 6 (six) hours as needed for nausea or vomiting. 20 tablet 1   ribociclib succ (KISQALI, 600 MG DOSE,) 200 MG Therapy Pack Take 3 tablets (600 mg total) by mouth daily. Take for 21 days on, 7 days off, repeat every 28 days. 63 tablet 5   sodium chloride (OCEAN) 0.65 % SOLN nasal spray Place 1 spray into both nostrils as needed for congestion.     traZODone (DESYREL) 50 MG tablet TAKE 1 TABLET (50 MG TOTAL)  BY MOUTH AT BEDTIME AS NEEDED. FOR SLEEP 90 tablet 0   venlafaxine XR (EFFEXOR-XR) 75 MG 24 hr capsule Take 1 capsule (75 mg total) by mouth daily with breakfast. 360 capsule 0   vitamin B-12 (CYANOCOBALAMIN) 1000 MCG tablet Take 2,500 mcg by mouth every Monday, Wednesday, and Friday.     Vitamin  D, Cholecalciferol, 50 MCG (2000 UT) CAPS Take 2,000 Units by mouth every Monday, Wednesday, and Friday.     No current facility-administered medications for this visit.    OBJECTIVE: BP 125/72 (BP Location: Left Arm, Patient Position: Sitting, Cuff Size: Normal)   Pulse 95   Temp (!) 97.1 F (36.2 C) (Tympanic)   Resp 16   Ht 5' 5.5" (1.664 m)   Wt 184 lb 11.2 oz (83.8 kg)   LMP 12/22/1999 (Approximate)   SpO2 100%   BMI 30.27 kg/m    Body mass index is 30.27 kg/m.    ECOG FS:0 - Asymptomatic  General: Well-developed, well-nourished, no acute distress. Eyes: Pink conjunctiva, anicteric sclera. HEENT: Normocephalic, moist mucous membranes. Lungs: No audible wheezing or coughing. Heart: Regular rate and rhythm. Abdomen: Soft, nontender, no obvious distention. Musculoskeletal: No edema, cyanosis, or clubbing. Neuro: Alert, answering all questions appropriately. Cranial nerves grossly intact. Skin: No rashes or petechiae noted. Psych: Normal affect.  LAB RESULTS:  Appointment on 09/16/2023  Component Date Value Ref Range Status   Magnesium 09/16/2023 2.0  1.7 - 2.4 mg/dL Final   Performed at [facility], [address] [place], [place] [address]   Sodium 09/16/2023 140  135 - 145 mmol/L Final   Potassium 09/16/2023 3.8  3.5 - 5.1 mmol/L Final   Chloride 09/16/2023 105  98 - 111 mmol/L Final   CO2 09/16/2023 26  22 - 32 mmol/L Final   Glucose, Bld 09/16/2023 114 (H)  70 - 99 mg/dL Final   Glucose reference range applies only to samples taken after fasting for at least 8 hours.   BUN 09/16/2023 26 (H)  8 - 23 mg/dL Final   Creatinine 32/44/0102 1.62 (H)  0.44 - 1.00 mg/dL  Final   Calcium 72/53/6644 9.2  8.9 - 10.3 mg/dL Final   Total Protein 03/47/4259 7.3  6.5 - 8.1 g/dL Final   Albumin 56/38/7564 4.0  3.5 - 5.0 g/dL Final   AST 33/29/5188 28  15 - 41 U/L Final   ALT 09/16/2023 20  0 - 44 U/L Final   Alkaline Phosphatase 09/16/2023 77  38 - 126 U/L Final   Total Bilirubin 09/16/2023 0.4  0.3 - 1.2 mg/dL Final   GFR, Estimated 09/16/2023 34 (L)  >60 mL/min Final   Comment: (NOTE) Calculated using the CKD-EPI Creatinine Equation (2021)    Anion gap 09/16/2023 9  5 - 15 Final   Performed at [facility] [address] [place], [place] [address]   WBC 09/16/2023 4.2  4.0 - 10.5 K/uL Final   RBC 09/16/2023 2.83 (L)  3.87 - 5.11 MIL/uL Final   Hemoglobin 09/16/2023 9.5 (L)  12.0 - 15.0 g/dL Final   HCT 63/11/6008 29.1 (L)  36.0 - 46.0 % Final   MCV 09/16/2023 102.8 (H)  80.0 - 100.0 fL Final   MCH 09/16/2023 33.6  26.0 - 34.0 pg Final   MCHC 09/16/2023 32.6  30.0 - 36.0 g/dL Final   RDW 93/23/5573 16.0 (H)  11.5 - 15.5 % Final   Platelets 09/16/2023 194  150 - 400 K/uL Final   nRBC 09/16/2023 0.0  0.0 - 0.2 % Final   Neutrophils Relative % 09/16/2023 45  % Final   Neutro Abs 09/16/2023 1.9  1.7 - 7.7 K/uL Final   Lymphocytes Relative 09/16/2023 33  % Final   Lymphs Abs 09/16/2023 1.4  0.7 - 4.0 K/uL Final   Monocytes Relative 09/16/2023 19  % Final  Monocytes Absolute 09/16/2023 0.8  0.1 - 1.0 K/uL Final   Eosinophils Relative 09/16/2023 1  % Final   Eosinophils Absolute 09/16/2023 0.1  0.0 - 0.5 K/uL Final   Basophils Relative 09/16/2023 2  % Final   Basophils Absolute 09/16/2023 0.1  0.0 - 0.1 K/uL Final   Immature Granulocytes 09/16/2023 0  % Final   Abs Immature Granulocytes 09/16/2023 0.01  0.00 - 0.07 K/uL Final   Performed at [facility], [address] [place], [place] [address]    STUDIES: No results found.  ONCOLOGY HISTORY:  Bilateral adenocarcinoma of the breast. Patient is status post bilateral mastectomy in  February 2015. She also received adjuvant chemotherapy with Adriamycin, Cytoxan, and Taxol completing treatment in approximately October 2015. She then initiated letrozole, but could not tolerate secondary to leg pain and was switched to Aromasin.  Patient then switched to tamoxifen in October 2019 secondary to cost.  Patient subsequently discontinued tamoxifen and did not complete the recommended 5 years of treatment.   ASSESSMENT: Recurrent, metastatic ER/PR, HER2 negative invasive carcinoma of the breast.   PLAN:    Recurrent, metastatic ER/PR, HER2 negative invasive carcinoma of the breast: See oncology history as above.  Biopsy confirmed recurrent disease.  PET scan results from December 26, 2021 reviewed independently with hypermetabolic left supraclavicular, subpectoral, and axillary lymphadenopathy consistent with nodal recurrence of patient's history of breast cancer.  No distant metastases were identified.  Her most recent PET scan on May 26, 2023 reviewed independently with improved disease burden. Her initial CA 27-29 initially was 260.1.  Now seems to have plateaued between 81.1 and 98.5.  Her most recent result was 94.9.  Today's result is pending.  Of note, patient also had metastatic breast cancer in her surgical specimen from her oophorectomy.  Continue Kisqali as prescribed.  Proceed with fulvestrant today.  Return to clinic in 4 weeks for further evaluation and evaluation by clinical pharmacy and then again in 8 weeks for further evaluation and continuation of treatment.  Appreciate clinical pharmacy input.   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  Follow-up with gynecology oncology as scheduled. Osteopenia: Patient's last bone mineral density was on August 01, 2016 and revealed a T-score of -1.2. Continue monitoring bone mineral density by PCP. Multiple sclerosis: Chronic and unchanged.  Continue evaluation and treatment per neurology.  Recent MRI of the  brain did not reveal any new lesions. Renal insufficiency: Chronic and unchanged.  Patient's GFR is 34 today.  Kisqali may need to be dose reduced if GFR remains persistently less than 30. Anemia: Hemoglobin trended down slightly to 9.5.  Monitor. Reflux: Continue omeprazole.    Patient expressed understanding and was in agreement with this plan. She also understands that She can call clinic at any time with any questions, concerns, or complaints.   Breast cancer bilateral, multifocal ER/PR pos, Her 2 neg.   Staging form: Breast, AJCC 7th Edition     Clinical: Stage IIB (T2, N1, M0) - Signed by Johney Maine, MD on 04/22/2015   Jeralyn Ruths, MD   09/16/2023 11:03 AM

## 2023-09-17 LAB — CANCER ANTIGEN 27.29: CA 27.29: 82 U/mL — ABNORMAL HIGH (ref 0.0–38.6)

## 2023-09-20 ENCOUNTER — Encounter: Payer: Self-pay | Admitting: *Deleted

## 2023-09-20 NOTE — Patient Instructions (Signed)
Order for [employer] submitted online.

## 2023-09-21 ENCOUNTER — Other Ambulatory Visit (HOSPITAL_COMMUNITY): Payer: Self-pay

## 2023-09-22 ENCOUNTER — Encounter: Payer: Self-pay | Admitting: Licensed Clinical Social Worker

## 2023-09-22 ENCOUNTER — Inpatient Hospital Stay: Payer: Medicare Other

## 2023-09-22 ENCOUNTER — Inpatient Hospital Stay (HOSPITAL_BASED_OUTPATIENT_CLINIC_OR_DEPARTMENT_OTHER): Payer: Medicare Other | Admitting: Licensed Clinical Social Worker

## 2023-09-22 DIAGNOSIS — Z801 Family history of malignant neoplasm of trachea, bronchus and lung: Secondary | ICD-10-CM

## 2023-09-22 DIAGNOSIS — Z803 Family history of malignant neoplasm of breast: Secondary | ICD-10-CM | POA: Diagnosis not present

## 2023-09-22 DIAGNOSIS — Z853 Personal history of malignant neoplasm of breast: Secondary | ICD-10-CM

## 2023-09-22 DIAGNOSIS — C50919 Malignant neoplasm of unspecified site of unspecified female breast: Secondary | ICD-10-CM | POA: Diagnosis not present

## 2023-09-22 DIAGNOSIS — Z800 Family history of malignant neoplasm of digestive organs: Secondary | ICD-10-CM

## 2023-09-22 NOTE — Progress Notes (Signed)
REFERRING PROVIDER: Jeralyn Ruths, MD [address] [place],  [place] [address]  PRIMARY PROVIDER:  Smitty Cords, DO  PRIMARY REASON FOR VISIT:  1. History of bilateral breast cancer   2. Recurrent malignant neoplasm of breast, unspecified laterality (HCC)   3. Family history of breast cancer   4. Family history of colon cancer   5. Family history of pancreatic cancer   6. Family history of lung cancer      HISTORY OF PRESENT ILLNESS:   Cassidy Norris, a 72 y.o. female, was seen for a [facility] cancer genetics consultation at the request of Dr. Orlie Dakin due to a personal and family history of cancer.  Cassidy Norris presents to clinic today to discuss the possibility of a hereditary predisposition to cancer, genetic testing, and to further clarify her future cancer risks, as well as potential cancer risks for family members.    CANCER HISTORY:  Oncology History Overview Note  1. Bilateral carcinoma of breast, February, 2015. Right breast status post radical mastectomy. T1 C. N0M0 (isolated tumor cells in one lymph node) multifocal invasive cancer. Stage IC, Left breast, Status post radical mastectomy, T2, N1, M0 tumor. Stage II, RIght breast. Both tumors are estrogen receptor positive.  Progesterone receptor positive.  HER-2 receptor negative by FISH. Negative for BRCA mutation.  2. Started on Adriamycin and Cytoxan on March 01, 2014. Last cycle of Cytoxan and Adriamycin on May 01, 2014. Patient has finished last chemotherapy on September 9. (12 th dose was omitted because of neuropathy) 3.  Taking tamoxifen.  Patient had a poor tolerance to letrozole.(October, 2015) 4.  Patient is taking letrozole since November of 2015 5.  May, 2016 Letrozole has been put on hold because of bony pains 6.  Started on Aromasin from July of 2016    History of bilateral breast cancer (Resolved)  11/29/2013 Initial Diagnosis   Breast cancer bilateral, multifocal ER/PR pos, Her 2 neg,.     In 2015, at the age of 74, Cassidy Norris was diagnosed with bilateral breast cancer. This was treated with bilateral mastectomy, chemotherapy, tamoxifen. She also had negative Myriad genetic testing at that time. In 2024, Cassidy Norris was diagnosed with recurrent breast cancer.  She underwent oophorectomy 07/2023, metastatic breast cancer in fallopian tube. 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  RISK FACTORS:  Menarche was at age 42-13.  Ovaries intact: no. Hysterectomy: no.  Menopausal status: postmenopausal.  Colonoscopy: yes;  possibly >10 polyps total .  Past Medical History:  Diagnosis Date   Anemia    Arthritis not really diagnosis but have pain   B12 deficiency 07/23/2016   Will check levels to determine if injections are needed again. Await results. Recent CBC at Onc W[facility]   Blood transfusion without reported diagnosis 1983 had a miscarriage/dnc   Cancer (HCC) 12/30/2013   Multifocal disease: pT1c,m, N0(isolated tumor cells) Er/ PR positive, Her 2 negative.   Cancer (HCC) 01/09/2014   Invasive lobular carcinoma, 2.2 cm, T2,N1 ER/PR positive, HER-2/neu negative   Cataract 2014?   Centrilobular emphysema (HCC) 10/27/2021   CKD (chronic kidney disease), stage IV (HCC) 07/13/2023   Complication of anesthesia 03/06/2022   Prolonged metabolism of Rocuronium during case.   Diabetes mellitus without complication (HCC)    Diffuse cystic mastopathy    Erosive esophagitis    Essential hypertension, benign 09/24/2017   Family history of malignant neoplasm of gastrointestinal tract 2012   Fractured elbow 08/28/2014   Gastroesophageal reflux disease  Hx of metabolic acidosis with increased anion gap    Increased heart rate    Multiple sclerosis (HCC)    Neuromuscular disorder (HCC)    neuropathy in bil feet - left is worse.   Obesity, unspecified    Peripheral neuropathy    Personal history of tobacco use, presenting hazards to health    Restless leg syndrome    Sleep apnea     uses CPAP   Special screening for malignant neoplasms, colon    Squamous cell carcinoma of skin 08/24/2022   SCCIS - scalp, ED&C   Squamous cell carcinoma of skin 07/20/2023   right of midline ant scalp post, tx with ED&C   Squamous cell carcinoma of skin 07/20/2023   right of midline ant scalp ant, tx with [facility]    Past Surgical History:  Procedure Laterality Date   BREAST BIOPSY Right 1973,2001   BREAST BIOPSY Right 11/07/2013   INVASIVE MAMMARY CARCINOMA , ER/PR positive Her2 negative   BREAST BIOPSY Left 11/27/2013   invasive lobular and DCIS   BREAST SURGERY Bilateral 01/09/2014   mastectomy   cataract surgery Left 08/13/2022   CHOLECYSTECTOMY     COLONOSCOPY  2010   Dr. Evette Cristal   COLONOSCOPY WITH PROPOFOL N/A 06/11/2015   Procedure: COLONOSCOPY WITH PROPOFOL;  Surgeon: Kieth Brightly, MD;  Location: [facility] ENDOSCOPY;  Service: Endoscopy;  Laterality: N/A;   COLONOSCOPY WITH PROPOFOL N/A 12/23/2021   Procedure: COLONOSCOPY WITH PROPOFOL;  Surgeon: Toney Reil, MD;  Location: [facility] ENDOSCOPY;  Service: Gastroenterology;  Laterality: N/A;   DILATATION & CURETTAGE/HYSTEROSCOPY WITH MYOSURE N/A 07/12/2018   Procedure: DILATATION & CURETTAGE/HYSTEROSCOPY WITH MYOSURE WITH ENDOMETRIAL POLYPECTOMY;  Surgeon: Conard Novak, MD;  Location: [facility] ORS;  Service: Gynecology;  Laterality: N/A;   DILATION AND CURETTAGE OF UTERUS  1983   ESOPHAGOGASTRODUODENOSCOPY N/A 12/23/2021   Procedure: ESOPHAGOGASTRODUODENOSCOPY (EGD);  Surgeon: Toney Reil, MD;  Location[facility] ENDOSCOPY;  Service: Gastroenterology;  Laterality: N/A;   JOINT REPLACEMENT Left 11/23/2015   right  2023   KNEE ARTHROPLASTY Right 03/06/2022   Procedure: COMPUTER ASSISTED TOTAL KNEE ARTHROPLASTY;  Surgeon: Donato Heinz, MD;  Location: [facility] ORS;  Service: Orthopedics;  Laterality: Right;   LAPAROSCOPIC BILATERAL SALPINGO OOPHERECTOMY N/A 08/04/2023   Procedure: LAPAROSCOPIC BILATERAL SALPINGO  OOPHORECTOMY;  Surgeon: Leida Lauth, MD;  Location: [facility] ORS;  Service: Gynecology;  Laterality: N/A;   POLYPECTOMY  1989   PORTA CATH REMOVAL     PORTACATH PLACEMENT  2015   SKIN BIOPSY     on scalp   SPINE SURGERY  09/14/2014   Ruptured disk L4- L5   TOTAL KNEE ARTHROPLASTY Left 11/22/2015   Procedure: TOTAL KNEE ARTHROPLASTY;  Surgeon: Frederico Hamman, MD;  Location: [facility] OR;  Service: Orthopedics;  Laterality: Left;   TUBAL LIGATION     WISDOM TOOTH EXTRACTION  2006    FAMILY HISTORY:  We obtained a detailed, 4-generation family history.  Significant diagnoses are listed below: Family History  Problem Relation Age of Onset   Lung cancer Mother 50   COPD Mother    Colon cancer Father 35   Stomach cancer Father 68   Hypertension Sister    Breast cancer Maternal Aunt        dx over 65   Breast cancer Paternal Aunt        dx 26s   Breast cancer Paternal Aunt        dx 57s   Rectal cancer Paternal Uncle  dx 75s   Rectal cancer Paternal Uncle        dx 38s   Diabetes Maternal Grandmother    Pancreatic cancer Paternal Grandfather    Rectal cancer Cousin        dx 3s-60s   Rectal cancer Cousin        dx >50   Cassidy Norris has 2 sisters, 34 and 58. Her older sister has had a tumor on her uterus but does not believe it was cancerous.   Cassidy Norris mother died at 96, she had history of lung cancer. A maternal aunt had breast cancer over age 74.  Cassidy Norris father died at 69 of colon/stomach cancer diagnosed at 63. Two paternal uncles died of rectal cancer in their 53s. Two paternal aunts died of breast cancer in their  39s. Two paternal cousins had colon cancer and are living. Paternal grandfather had pancreatic cancer.   Cassidy Norris is aware of previous family history of genetic testing for hereditary cancer risks. She reports 1% Jewish ancestry on heritage test.  There is no known consanguinity.    GENETIC COUNSELING ASSESSMENT: Cassidy Norris is a 71 y.o. female with a  personal and family history of cancer which is somewhat suggestive of a hereditary cancer syndrome and predisposition to cancer. We, therefore, discussed and recommended the following at today's visit.   DISCUSSION: We discussed that approximately 10% of cancer is hereditary. Most cases of hereditary breast cancer are associated with BRCA1/BRCA2 genes which she had previous negative genetic testing for, although there are other genes associated with hereditary cancer as well. Cancers and risks are gene specific. It's also possible we could find a mutation in a gene she had tested previously as our technology has improved since 2015. We also have a few genes including DICER1 that can be associated with Leydig cell tumors.  We discussed that testing is beneficial for several reasons including knowing about cancer risks, identifying potential screening and risk-reduction options that may be appropriate, and to understand if other family members could be at risk for cancer and allow them to undergo genetic testing.   We reviewed the characteristics, features and inheritance patterns of hereditary cancer syndromes. We also discussed genetic testing, including the appropriate family members to test, the process of testing, insurance coverage and turn-around-time for results. We discussed the implications of a negative, positive and/or variant of uncertain significant result. We recommended Cassidy Norris pursue genetic testing for the [facility] Multi-Cancer+RNA gene panel.   Based on Cassidy Norris's personal and family history of cancer, she meets medical criteria for genetic testing. Despite that she meets criteria, she may still have an out of pocket cost.   PLAN: After considering the risks, benefits, and limitations, Cassidy Norris provided informed consent to pursue genetic testing and the blood sample was sent to [facility] for analysis of the Multi-Cancer+RNA panel. Results should be available within  approximately 2-3 weeks' time, at which point they will be disclosed by telephone to Cassidy Norris, as will any additional recommendations warranted by these results. Cassidy Norris will receive a summary of her genetic counseling visit and a copy of her results once available. This information will also be available in Epic.   Cassidy Norris questions were answered to her satisfaction today. Our contact information was provided should additional questions or concerns arise. Thank you for the referral and allowing [place] to share in the care of your patient.   Lacy Duverney, MS, [facility] Genetic Counselor [email or web address] Phone: [phone]  The patient was seen for a total of 20 minutes in face-to-face genetic counseling.  Dr. Blake Divine was available for discussion regarding this case.   _______________________________________________________________________ For Office Staff:  Number of people involved in session: 1 Was an Intern/ student involved with case: no

## 2023-09-23 ENCOUNTER — Telehealth: Payer: Self-pay

## 2023-09-23 NOTE — Telephone Encounter (Signed)
Oral Oncology Patient Advocate Encounter   Received notification that patient is due for re-enrollment for assistance for Kis[employer]ali through [employer].   Re-enrollment process has been initiated and will be submitted upon completion of necessary documents.  NPAF's phone number [phone].   I will continue to follow until final determination.   Ardeen Fillers, CPhT Oncology Pharmacy Patient Advocate  [facility]  [phone] (phone) [phone] (fax) 09/23/2023 8:05 AM

## 2023-09-23 NOTE — Telephone Encounter (Signed)
Oral Oncology Patient Advocate Encounter   Submitted application for assistance for Cassidy Norris to [employer].   Application submitted via e-fax to [phone]   NPAF's phone number [phone].   I will continue to check the status until final determination.    Ardeen Fillers, CPhT Oncology Pharmacy Patient Advocate  [facility]  [phone] (phone) [phone] (fax) 09/23/2023 8:06 AM

## 2023-09-28 ENCOUNTER — Encounter: Payer: Self-pay | Admitting: Licensed Clinical Social Worker

## 2023-09-28 ENCOUNTER — Ambulatory Visit (INDEPENDENT_AMBULATORY_CARE_PROVIDER_SITE_OTHER): Payer: Medicare Other | Admitting: Family Medicine

## 2023-09-28 ENCOUNTER — Encounter: Payer: Self-pay | Admitting: Family Medicine

## 2023-09-28 ENCOUNTER — Telehealth: Payer: Self-pay | Admitting: Licensed Clinical Social Worker

## 2023-09-28 ENCOUNTER — Ambulatory Visit: Payer: Self-pay | Admitting: Licensed Clinical Social Worker

## 2023-09-28 VITALS — BP 132/64 | HR 110 | Ht 65.5 in | Wt 186.0 lb

## 2023-09-28 DIAGNOSIS — Z1379 Encounter for other screening for genetic and chromosomal anomalies: Secondary | ICD-10-CM | POA: Insufficient documentation

## 2023-09-28 DIAGNOSIS — R051 Acute cough: Secondary | ICD-10-CM

## 2023-09-28 MED ORDER — ALBUTEROL SULFATE HFA 108 (90 BASE) MCG/ACT IN AERS: 2.0000 | INHALATION_SPRAY | RESPIRATORY_TRACT | 2 refills | Status: DC | PRN

## 2023-09-28 MED ORDER — BENZONATATE 100 MG PO CAPS: 100.0000 mg | ORAL_CAPSULE | Freq: Three times a day (TID) | ORAL | 0 refills | Status: DC | PRN

## 2023-09-28 NOTE — Progress Notes (Signed)
Subjective:    Patient ID: Cassidy Norris, female    DOB: 06-09-1951, 72 y.o.   MRN: [id number]  Cassidy Norris is a 72 y.o. female presenting on 09/28/2023 for Cough (X 10 days, intermittent at night time or when hot, no fever, congestion, runny nose, or sore throat)   HPI  Discussed the use of AI scribe software for clinical note transcription with the patient, who gave verbal consent to proceed.  Persistent Cough / Bronchospasm  Emphysema COPD without exacerbation  Presents with a persistent cough that has been ongoing for over a week. The cough is not constant, but rather occurs in spells, and is reportedly worse in the evenings. Some sick contacts. Possibly had viral URI initially, but cough did not resolve. The patient has been self-medicating with over-the-counter cough medicine OTC Delsym which provides temporary relief for about four hours. The cough is sometimes productive, but not consistently. There is no associated ear pain or pressure. The patient also mentions a change in her usual habits, wearing a sweater more frequently due to feeling cold, which she attributes to her cancer medication. The patient denies any significant impact on her sleep pattern due to the cough, but mentions it can cause headaches and a sore throat.          09/28/2023    2:30 PM 07/15/2023    8:33 AM 07/13/2023    8:40 AM  Depression screen PHQ 2/9  Decreased Interest 1 0 0  Down, Depressed, Hopeless 0 0 0  PHQ - 2 Score 1 0 0  Altered sleeping 0 0 1  Tired, decreased energy 1 0 1  Change in appetite 1 0 0  Feeling bad or failure about yourself  0 0 0  Trouble concentrating 0 0 0  Moving slowly or fidgety/restless 0 0 0  Suicidal thoughts 0 0 0  PHQ-9 Score 3 0 2  Difficult doing work/chores Not difficult at all Not difficult at all Not difficult at all    Social History   Tobacco Use   Smoking status: Former    Current packs/day: 0.00    Average packs/day: 1 pack/day for 30.0  years (30.0 ttl pk-yrs)    Types: Cigarettes    Start date: 12/12/1983    Quit date: 12/11/2013    Years since quitting: 9.8   Smokeless tobacco: Former  Building services engineer status: Never Used  Substance Use Topics   Alcohol use: Not Currently   Drug use: No    Review of Systems Per HPI unless specifically indicated above     Objective:    BP 132/64   Pulse (!) 110   Ht 5' 5.5" (1.664 m)   Wt 186 lb (84.4 kg)   LMP 12/22/1999 (Approximate)   SpO2 98%   BMI 30.48 kg/m   Wt Readings from Last 3 Encounters:  09/28/23 186 lb (84.4 kg)  09/16/23 184 lb 11.2 oz (83.8 kg)  09/08/23 186 lb 6.4 oz (84.6 kg)    Physical Exam Vitals and nursing note reviewed.  Constitutional:      General: She is not in acute distress.    Appearance: She is well-developed. She is not diaphoretic.     Comments: Well-appearing, comfortable, cooperative  HENT:     Head: Normocephalic and atraumatic.  Eyes:     General:        Right eye: No discharge.        Left eye: No discharge.  Conjunctiva/sclera: Conjunctivae normal.  Neck:     Thyroid: No thyromegaly.  Cardiovascular:     Rate and Rhythm: Normal rate and regular rhythm.     Heart sounds: Normal heart sounds. No murmur heard. Pulmonary:     Effort: Pulmonary effort is normal. No respiratory distress.     Breath sounds: No wheezing or rales.     Comments: Mild tight breath sounds upper airway without focal wheezing. Musculoskeletal:        General: Normal range of motion.     Cervical back: Normal range of motion and neck supple.  Lymphadenopathy:     Cervical: No cervical adenopathy.  Skin:    General: Skin is warm and dry.     Findings: No erythema or rash.  Neurological:     Mental Status: She is alert and oriented to person, place, and time.  Psychiatric:        Behavior: Behavior normal.     Comments: Well groomed, good eye contact, normal speech and thoughts    Results for orders placed or performed in visit on  09/16/23  Cancer antigen 27.29  Result Value Ref Range   CA 27.29 82.0 (H) 0.0 - 38.6 U/mL  Magnesium  Result Value Ref Range   Magnesium 2.0 1.7 - 2.4 mg/dL  CMP (Cancer Center only)  Result Value Ref Range   Sodium 140 135 - 145 mmol/L   Potassium 3.8 3.5 - 5.1 mmol/L   Chloride 105 98 - 111 mmol/L   CO2 26 22 - 32 mmol/L   Glucose, Bld 114 (H) 70 - 99 mg/dL   BUN 26 (H) 8 - 23 mg/dL   Creatinine 8.29 (H) 5.62 - 1.00 mg/dL   Calcium 9.2 8.9 - 13.0 mg/dL   Total Protein 7.3 6.5 - 8.1 g/dL   Albumin 4.0 3.5 - 5.0 g/dL   AST 28 15 - 41 U/L   ALT 20 0 - 44 U/L   Alkaline Phosphatase 77 38 - 126 U/L   Total Bilirubin 0.4 0.3 - 1.2 mg/dL   GFR, Estimated 34 (L) >60 mL/min   Anion gap 9 5 - 15  CBC with Differential/Platelet  Result Value Ref Range   WBC 4.2 4.0 - 10.5 K/uL   RBC 2.83 (L) 3.87 - 5.11 MIL/uL   Hemoglobin 9.5 (L) 12.0 - 15.0 g/dL   HCT 86.5 (L) 78.4 - 69.6 %   MCV 102.8 (H) 80.0 - 100.0 fL   MCH 33.6 26.0 - 34.0 pg   MCHC 32.6 30.0 - 36.0 g/dL   RDW 29.5 (H) 28.4 - 13.2 %   Platelets 194 150 - 400 K/uL   nRBC 0.0 0.0 - 0.2 %   Neutrophils Relative % 45 %   Neutro Abs 1.9 1.7 - 7.7 K/uL   Lymphocytes Relative 33 %   Lymphs Abs 1.4 0.7 - 4.0 K/uL   Monocytes Relative 19 %   Monocytes Absolute 0.8 0.1 - 1.0 K/uL   Eosinophils Relative 1 %   Eosinophils Absolute 0.1 0.0 - 0.5 K/uL   Basophils Relative 2 %   Basophils Absolute 0.1 0.0 - 0.1 K/uL   Immature Granulocytes 0 %   Abs Immature Granulocytes 0.01 0.00 - 0.07 K/uL      Assessment & Plan:   Problem List Items Addressed This Visit   None Visit Diagnoses     Acute cough    -  Primary   Relevant Medications   benzonatate (TESSALON) 100 MG capsule  albuterol (VENTOLIN HFA) 108 (90 Base) MCG/ACT inhaler      Assessment and Plan    Persistent Cough Likely bronchospasm, possibly triggered by a viral infection. No signs of pneumonia or other severe respiratory conditions. Cough is bothersome but  not constant, worse in the evening.  -Start rescue Albuterol inhaler, two puffs every 4-6 hours as needed, especially at night. -Start [employer], three times a day as needed. -Consider antibiotic and steroid if no improvement by the end of the week. -Defer X-ray     Meds ordered this encounter  Medications   benzonatate (TESSALON) 100 MG capsule    Sig: Take 1 capsule (100 mg total) by mouth 3 (three) times daily as needed for cough.    Dispense:  30 capsule    Refill:  0   albuterol (VENTOLIN HFA) 108 (90 Base) MCG/ACT inhaler    Sig: Inhale 2 puffs into the lungs every 4 (four) hours as needed for wheezing or shortness of breath.    Dispense:  8 g    Refill:  2      Follow up plan: Return if symptoms worsen or fail to improve.    Saralyn Pilar, DO [facility] 09/28/2023, 3:09 PM

## 2023-09-28 NOTE — Telephone Encounter (Signed)
I contacted Ms. Rosenkrantz to discuss her genetic testing results. No pathogenic variants were identified in the 70 genes analyzed. Detailed clinic note to follow.   The test report has been scanned into EPIC and is located under the Molecular Pathology section of the Results Review tab.  A portion of the result report is included below for reference.      Faith Rogue, MS, [facility] Genetic Counselor [email or web address] Phone: [phone]

## 2023-09-28 NOTE — Patient Instructions (Addendum)
Thank you for coming to the office today.  Cough / Bronchospasm  Likely recurrent episodes of cough and lungs tightening with spasm.   Try rescue inhaler Albuterol as needed 2 puffs, every 4-6 hours as needed. Caution some jittery and racing heart temporarily  Start Tessalon Perls take 1 capsule up to 3 times a day as needed for cough  If not improving by end of the week we can consider, antibiotic + steroid if needed.   Please schedule a Follow-up Appointment to: Return if symptoms worsen or fail to improve.  If you have any other questions or concerns, please feel free to call the office or send a message through MyChart. You may also schedule an earlier appointment if necessary.  Additionally, you may be receiving a survey about your experience at our office within a few days to 1 week by e-mail or mail. We value your feedback.  Saralyn Pilar, DO [facility], [place]

## 2023-09-28 NOTE — Progress Notes (Signed)
HPI:   [name] was previously seen in the [facility] due to a personal and family history of cancer and concerns regarding a hereditary predisposition to cancer. Please refer to our prior cancer genetics clinic note for more information regarding our discussion, assessment and recommendations, at the time. [name] recent genetic test results were disclosed to her, as were recommendations warranted by these results. These results and recommendations are discussed in more detail below.  CANCER HISTORY:  Oncology History Overview Note  1. Bilateral carcinoma of breast, February, 2015. Right breast status post radical mastectomy. T1 C. N0M0 (isolated tumor cells in one lymph node) multifocal invasive cancer. Stage IC, Left breast, Status post radical mastectomy, T2, N1, M0 tumor. Stage II, RIght breast. Both tumors are estrogen receptor positive.  Progesterone receptor positive.  HER-2 receptor negative by FISH. Negative for BRCA mutation.  2. Started on Adriamycin and Cytoxan on March 01, 2014. Last cycle of Cytoxan and Adriamycin on May 01, 2014. Patient has finished last chemotherapy on September 9. (12 th dose was omitted because of neuropathy) 3.  Taking tamoxifen.  Patient had a poor tolerance to letrozole.(October, 2015) 4.  Patient is taking letrozole since November of 2015 5.  May, 2016 Letrozole has been put on hold because of bony pains 6.  Started on Aromasin from July of 2016    History of bilateral breast cancer (Resolved)  11/29/2013 Initial Diagnosis   Breast cancer bilateral, multifocal ER/PR pos, Her 2 neg,.     FAMILY HISTORY:  We obtained a detailed, 4-generation family history.  Significant diagnoses are listed below: Family History  Problem Relation Age of Onset   Lung cancer Mother 98   COPD Mother    Colon cancer Father 52   Stomach cancer Father 45   Hypertension Sister    Breast cancer Maternal Aunt        dx over 24   Breast cancer  Paternal Aunt        dx 28s   Breast cancer Paternal Aunt        dx 10s   Rectal cancer Paternal Uncle        dx 68s   Rectal cancer Paternal Uncle        dx 72s   Diabetes Maternal Grandmother    Pancreatic cancer Paternal Grandfather    Rectal cancer Cousin        dx 47s-60s   Rectal cancer Cousin        dx >50   [name] has 2 sisters, 43 and 80. Her older sister has had a tumor on her uterus but does not believe it was cancerous.    [name] mother died at 75, she had history of lung cancer. A maternal aunt had breast cancer over age 2.   [name] father died at 36 of colon/stomach cancer diagnosed at 75. Two paternal uncles died of rectal cancer in their 45s. Two paternal aunts died of breast cancer in their  32s. Two paternal cousins had colon cancer and are living. Paternal grandfather had pancreatic cancer.    [name] is aware of previous family history of genetic testing for hereditary cancer risks. She reports 1% Jewish ancestry on heritage test.  There is no known consanguinity.      GENETIC TEST RESULTS:  The [facility] Multi-Cancer+RNA Panel found no pathogenic mutations.   The Multi-Cancer + RNA Panel offered by [facility] includes sequencing and/or deletion/duplication analysis of the following 70  genes:  AIP*, ALK, APC*, ATM*, AXIN2*, BAP1*, BARD1*, BLM*, BMPR1A*, BRCA1*, BRCA2*, BRIP1*, CDC73*, CDH1*, CDK4, CDKN1B*, CDKN2A, CHEK2*, CTNNA1*, DICER1*, EPCAM, EGFR, FH*, FLCN*, GREM1, HOXB13, KIT, LZTR1, MAX*, MBD4, MEN1*, MET, MITF, MLH1*, MSH2*, MSH3*, MSH6*, MUTYH*, NF1*, NF2*, NTHL1*, PALB2*, PDGFRA, PMS2*, POLD1*, POLE*, POT1*, PRKAR1A*, PTCH1*, PTEN*, RAD51C*, RAD51D*, RB1*, RET, SDHA*, SDHAF2*, SDHB*, SDHC*, SDHD*, SMAD4*, SMARCA4*, SMARCB1*, SMARCE1*, STK11*, SUFU*, TMEM127*, TP53*, TSC1*, TSC2*, VHL*. RNA analysis is performed for * genes.   The test report has been scanned into EPIC and is located under the Molecular Pathology section of the Results Review  tab.  A portion of the result report is included below for reference. Genetic testing reported out on 09/27/2023.     Even though a pathogenic variant was not identified, possible explanations for the cancer in the family may include: There may be no hereditary risk for cancer in the family. The cancers in [name] and/or her family may be sporadic/familial or due to other genetic and environmental factors. There may be a gene mutation in one of these genes that current testing methods cannot detect but that chance is small. There could be another gene that has not yet been discovered, or that we have not yet tested, that is responsible for the cancer diagnoses in the family.  It is also possible there is a hereditary cause for the cancer in the family that [name] did not inherit.  Therefore, it is important to remain in touch with cancer genetics in the future so that we can continue to offer [name] the most up to date genetic testing.   ADDITIONAL GENETIC TESTING:  We discussed with [name] that her genetic testing was fairly extensive.  If there are additional relevant genes identified to increase cancer risk that can be analyzed in the future, we would be happy to discuss and coordinate this testing at that time.    CANCER SCREENING RECOMMENDATIONS:  [name] test result is considered negative (normal).  This means that we have not identified a hereditary cause for her personal and family history of cancer at this time.   An individual's cancer risk and medical management are not determined by genetic test results alone. Overall cancer risk assessment incorporates additional factors, including personal medical history, family history, and any available genetic information that may result in a personalized plan for cancer prevention and surveillance. Therefore, it is recommended she continue to follow the cancer management and screening guidelines provided by her oncology and primary  healthcare provider.  RECOMMENDATIONS FOR FAMILY MEMBERS:   Individuals in this family might be at some increased risk of developing cancer, over the general population risk, due to the family history of cancer.  Individuals in the family should notify their providers of the family history of cancer. We recommend women in this family have a yearly mammogram beginning at age 84, or 55 years younger than the earliest onset of cancer, an annual clinical breast exam, and perform monthly breast self-exams.  Family members should have colonoscopies by at age 65, or earlier, as recommended by their providers. Other members of the family may still carry a pathogenic variant in one of these genes that [name] did not inherit. Based on the family history, we recommend her paternal relatives have genetic counseling and testing. [name] will let us know if we can be of any assistance in coordinating genetic counseling and/or testing for this family member.    FOLLOW-UP:  Lastly, we discussed with  [name] that cancer genetics is a rapidly advancing field and it is possible that new genetic tests will be appropriate for her and/or her family members in the future. We encouraged her to remain in contact with cancer genetics on an annual basis so we can update her personal and family histories and let her know of advances in cancer genetics that may benefit this family.   Our contact number was provided. [name] questions were answered to her satisfaction, and she knows she is welcome to call us at anytime with additional questions or concerns.    Lacy Duverney, MS, [facility] Genetic Counselor [email or web address] Phone: [phone]

## 2023-10-04 NOTE — Telephone Encounter (Signed)
Received notification from [employer] that patient was required to apply for Medicare Extra Help before re-enrollment application could continue processing. I called patient and submitted application for Medicare Extra Help via Social Security Administration web portal. Patient knows to bring in copy of denial or approval letter once received. I will submit letter of denial or approval once in hand. I will continue to follow and update until final determination.    Ardeen Fillers, CPhT Oncology Pharmacy Patient Advocate  [facility]  [phone] (phone) [phone] (fax) 10/04/2023 10:25 AM

## 2023-10-05 ENCOUNTER — Encounter: Payer: Self-pay | Admitting: *Deleted

## 2023-10-05 ENCOUNTER — Telehealth: Payer: Self-pay | Admitting: *Deleted

## 2023-10-05 NOTE — Telephone Encounter (Signed)
Call placed to patient to follow up on message received from [facility]. Patient received forms in the mail from [employer] that included consent and financial assistance forms and needed assistance with if these needed to be completed at this time. I called and spoke with [employer], these forms are a part of a welcome packet that is sent out and no need to complete at this time. [employer] reaches out directly to patients if there is anything further needed.

## 2023-10-10 ENCOUNTER — Other Ambulatory Visit: Payer: Self-pay | Admitting: Family Medicine

## 2023-10-10 DIAGNOSIS — R051 Acute cough: Secondary | ICD-10-CM

## 2023-10-11 DIAGNOSIS — C50919 Malignant neoplasm of unspecified site of unspecified female breast: Secondary | ICD-10-CM | POA: Diagnosis not present

## 2023-10-11 NOTE — Telephone Encounter (Signed)
Requested Prescriptions  Pending Prescriptions Disp Refills   benzonatate (TESSALON) 100 MG capsule [Pharmacy Med Name: BENZONATATE 100 MG CAPSULE] 30 capsule 0    Sig: TAKE 1 CAPSULE BY MOUTH THREE TIMES A DAY AS NEEDED FOR COUGH     Ear, Nose, and Throat:  Antitussives/Expectorants Passed - 10/10/2023  1:09 PM      Passed - Valid encounter within last 12 months    Recent Outpatient Visits           1 week ago Acute cough   [place] [facility] Coleman, Netta Neat, DO   3 months ago CKD (chronic kidney disease), stage IV [facility])   [place] [facility] Smitty Cords, DO   5 months ago Essential hypertension, benign   [place] [facility] Smitty Cords, DO   11 months ago Essential hypertension, benign   [place] [facility] Smitty Cords, DO   1 year ago Pruritic erythematous rash   [place] [facility] Chase, Netta Neat, DO       Future Appointments             In 1 week Kirke Corin, Chelsea Aus, MD [facility] [place]   In 3 weeks Althea Charon, Netta Neat, DO [facility]   In 4 months Deirdre Evener, MD [facility]

## 2023-10-13 NOTE — Progress Notes (Signed)
Oral Chemotherapy Clinic [facility]  [phone] [phone]  Patient Care Team: Smitty Cords, DO as PCP - General (Family Medicine) Drema Dallas, DO as Consulting Physician (Neurology) Jeralyn Ruths, MD as Consulting Physician (Oncology) Benita Gutter, RN as Oncology Nurse Navigator Isla Pence, [place] (Optometry)   Name of the patient: Cassidy Norris  [id number]  Jun 14, 1951   Date of visit: 10/14/23  HPI: Patient is a 72 y.o. female with recurrent breast cancer, ER/PR positive/HER2 negative. Currently treating with Kisqali (ribociclib) and fulvestrant. She started ribociclib on 01/08/22. Her treatment was held for surgery which was completed on 08/04/23. Patient resumed her ribociclib on 08/19/23.  Reason for Consult: Oral chemotherapy follow-up for ribociclib therapy.   PAST MEDICAL HISTORY: Past Medical History:  Diagnosis Date   Anemia    Arthritis not really diagnosis but have pain   B12 deficiency 07/23/2016   Will check levels to determine if injections are needed again. Await results. Recent CBC at Onc [facility]   Blood transfusion without reported diagnosis 1983 had a miscarriage/dnc   Cancer (HCC) 12/30/2013   Multifocal disease: pT1c,m, N0(isolated tumor cells) Er/ PR positive, Her 2 negative.   Cancer (HCC) 01/09/2014   Invasive lobular carcinoma, 2.2 cm, T2,N1 ER/PR positive, HER-2/neu negative   Cataract 2014?   Centrilobular emphysema (HCC) 10/27/2021   CKD (chronic kidney disease), stage IV (HCC) 07/13/2023   Complication of anesthesia 03/06/2022   Prolonged metabolism of Rocuronium during case.   Diabetes mellitus without complication (HCC)    Diffuse cystic mastopathy    Erosive esophagitis    Essential hypertension, benign 09/24/2017   Family history of malignant neoplasm of gastrointestinal tract 2012   Fractured elbow 08/28/2014   Gastroesophageal reflux disease    Hx of metabolic acidosis with  increased anion gap    Increased heart rate    Multiple sclerosis (HCC)    Neuromuscular disorder (HCC)    neuropathy in bil feet - left is worse.   Obesity, unspecified    Peripheral neuropathy    Personal history of tobacco use, presenting hazards to health    Restless leg syndrome    Sleep apnea    uses CPAP   Special screening for malignant neoplasms, colon    Squamous cell carcinoma of skin 08/24/2022   SCCIS - scalp, ED&C   Squamous cell carcinoma of skin 07/20/2023   right of midline ant scalp post, tx with ED&C   Squamous cell carcinoma of skin 07/20/2023   right of midline ant scalp ant, tx with [facility]    HEMATOLOGY/ONCOLOGY HISTORY:  Oncology History Overview Note  1. Bilateral carcinoma of breast, February, 2015. Right breast status post radical mastectomy. T1 C. N0M0 (isolated tumor cells in one lymph node) multifocal invasive cancer. Stage IC, Left breast, Status post radical mastectomy, T2, N1, M0 tumor. Stage II, RIght breast. Both tumors are estrogen receptor positive.  Progesterone receptor positive.  HER-2 receptor negative by FISH. Negative for BRCA mutation.  2. Started on Adriamycin and Cytoxan on March 01, 2014. Last cycle of Cytoxan and Adriamycin on May 01, 2014. Patient has finished last chemotherapy on September 9. (12 th dose was omitted because of neuropathy) 3.  Taking tamoxifen.  Patient had a poor tolerance to letrozole.(October, 2015) 4.  Patient is taking letrozole since November of 2015 5.  May, 2016 Letrozole has been put on hold because of bony pains 6.  Started on Aromasin from July of 2016  History of bilateral breast cancer (Resolved)  11/29/2013 Initial Diagnosis   Breast cancer bilateral, multifocal ER/PR pos, Her 2 neg,.     ALLERGIES:  has No Known Allergies.  MEDICATIONS:  Current Outpatient Medications  Medication Sig Dispense Refill   acetaminophen (TYLENOL) 500 MG tablet Take 1,000 mg by mouth 2 (two) times daily as needed for  moderate pain or headache.     albuterol (VENTOLIN HFA) 108 (90 Base) MCG/ACT inhaler Inhale 2 puffs into the lungs every 4 (four) hours as needed for wheezing or shortness of breath. 8 g 2   baclofen (LIORESAL) 10 MG tablet Take 1 tablet (10 mg total) by mouth 3 (three) times daily as needed for muscle spasms. 1080 tablet 0   benzonatate (TESSALON) 100 MG capsule TAKE 1 CAPSULE BY MOUTH THREE TIMES A DAY AS NEEDED FOR COUGH 30 capsule 0   calcium carbonate (OSCAL) 1500 (600 Ca) MG TABS tablet Take by mouth 2 (two) times daily with a meal.     dapagliflozin propanediol (FARXIGA) 10 MG TABS tablet Take 10 mg by mouth daily.     diclofenac Sodium (VOLTAREN) 1 % GEL Apply 2 g topically 3 (three) times daily as needed. 100 g 2   fluticasone (FLONASE) 50 MCG/ACT nasal spray PLACE 2 SPRAYS INTO BOTH NOSTRILS AT BEDTIME. 48 g 1   fulvestrant (FASLODEX) 250 MG/5ML injection Inject 500 mg into the muscle every 30 (thirty) days. One injection each buttock over 1-2 minutes. Warm prior to use.     Krill Oil 1000 MG CAPS Take 1,000 mg by mouth daily.     lisinopril (ZESTRIL) 5 MG tablet Take 1 tablet (5 mg total) by mouth daily. 90 tablet 3   loperamide (IMODIUM A-D) 2 MG tablet Take 2 tablets (4 mg total) by mouth 4 (four) times daily as needed for diarrhea or loose stools. 30 tablet 0   metFORMIN (GLUCOPHAGE) 500 MG tablet Take 1 tablet (500 mg total) by mouth 2 (two) times daily with a meal. 180 tablet 3   omeprazole (PRILOSEC) 40 MG capsule TAKE 1 CAPSULE BY MOUTH TWICE A DAY 180 capsule 1   ondansetron (ZOFRAN) 8 MG tablet Take 1 tablet (8 mg total) by mouth every 8 (eight) hours as needed for nausea or vomiting. 20 tablet 0   prochlorperazine (COMPAZINE) 10 MG tablet Take 1 tablet (10 mg total) by mouth every 6 (six) hours as needed for nausea or vomiting. 20 tablet 1   ribociclib succ (KISQALI, 600 MG DOSE,) 200 MG Therapy Pack Take 3 tablets (600 mg total) by mouth daily. Take for 21 days on, 7 days off,  repeat every 28 days. 63 tablet 5   sodium chloride (OCEAN) 0.65 % SOLN nasal spray Place 1 spray into both nostrils as needed for congestion.     traZODone (DESYREL) 50 MG tablet TAKE 1 TABLET (50 MG TOTAL) BY MOUTH AT BEDTIME AS NEEDED. FOR SLEEP 90 tablet 0   venlafaxine XR (EFFEXOR-XR) 75 MG 24 hr capsule Take 1 capsule (75 mg total) by mouth daily with breakfast. 360 capsule 0   vitamin B-12 (CYANOCOBALAMIN) 1000 MCG tablet Take 2,500 mcg by mouth every Monday, Wednesday, and Friday.     Vitamin D, Cholecalciferol, 50 MCG (2000 UT) CAPS Take 2,000 Units by mouth every Monday, Wednesday, and Friday.     No current facility-administered medications for this visit.    VITAL SIGNS: LMP 12/22/1999 (Approximate)  There were no vitals filed for this visit.  Estimated body mass index is 30.48 kg/m as calculated from the following:   Height as of 09/28/23: 5' 5.5" (1.664 m).   Weight as of 09/28/23: 84.4 kg (186 lb).  LABS: CBC:    Component Value Date/Time   WBC 5.0 10/14/2023 0919   HGB 9.5 (L) 10/14/2023 0919   HGB 9.1 (L) 02/18/2023 0944   HGB 15.5 07/13/2017 0937   HCT 29.7 (L) 10/14/2023 0919   HCT 46.7 (H) 07/13/2017 0937   PLT 179 10/14/2023 0919   PLT 211 02/18/2023 0944   PLT 211 07/13/2017 0937   MCV 105.3 (H) 10/14/2023 0919   MCV 94 07/13/2017 0937   MCV 93 03/11/2015 1356   NEUTROABS 2.6 10/14/2023 0919   NEUTROABS 5.4 07/13/2017 0937   NEUTROABS 5.6 03/11/2015 1356   LYMPHSABS 1.4 10/14/2023 0919   LYMPHSABS 2.1 07/13/2017 0937   LYMPHSABS 2.3 03/11/2015 1356   MONOABS 0.7 10/14/2023 0919   MONOABS 1.0 (H) 03/11/2015 1356   EOSABS 0.1 10/14/2023 0919   EOSABS 0.2 07/13/2017 0937   EOSABS 0.3 03/11/2015 1356   BASOSABS 0.1 10/14/2023 0919   BASOSABS 0.0 07/13/2017 0937   BASOSABS 0.1 03/11/2015 1356   Comprehensive Metabolic Panel:    Component Value Date/Time   NA 142 10/14/2023 0919   NA 142 07/14/2019 1516   NA 139 03/11/2015 1356   K 4.7  10/14/2023 0919   K 3.4 (L) 03/11/2015 1356   CL 103 10/14/2023 0919   CL 103 03/11/2015 1356   CO2 27 10/14/2023 0919   CO2 28 03/11/2015 1356   BUN 32 (H) 10/14/2023 0919   BUN 12 07/14/2019 1516   BUN 13 03/11/2015 1356   CREATININE 1.76 (H) 10/14/2023 0919   CREATININE 1.62 (H) 09/16/2023 0856   CREATININE 0.85 10/20/2021 0838   GLUCOSE 111 (H) 10/14/2023 0919   GLUCOSE 118 (H) 03/11/2015 1356   CALCIUM 9.7 10/14/2023 0919   CALCIUM 9.2 03/11/2015 1356   AST 29 10/14/2023 0919   AST 28 09/16/2023 0856   ALT 23 10/14/2023 0919   ALT 20 09/16/2023 0856   ALT 32 03/11/2015 1356   ALKPHOS 79 10/14/2023 0919   ALKPHOS 68 03/11/2015 1356   BILITOT 0.6 10/14/2023 0919   BILITOT 0.4 09/16/2023 0856   PROT 7.3 10/14/2023 0919   PROT 6.9 07/14/2019 1516   PROT 6.9 03/11/2015 1356   ALBUMIN 4.0 10/14/2023 0919   ALBUMIN 4.5 07/14/2019 1516   ALBUMIN 4.2 03/11/2015 1356     Present during today's visit: patient only  Assessment and Plan: CBC/CMP reviewed, continue ribociclib 600mg  21on/7 off  Patient will receive fulvestrant today.    Oral Chemotherapy Side Effect/Intolerance:  No reported nausea since patient stopped taking her ribociclib first thing in the morning and fatigue unchanged  Oral Chemotherapy Adherence: No missed doses in the last cycle reported There are no patient barriers to medication adherence identified.   New medications: None reported  Medication Access Issues: No issues, patient on mfg assistance. In the new year she will switch to grant funding and fill at [employer] (Specialty).   Patient expressed understanding and was in agreement with this plan. She also understands that She can call clinic at any time with any questions, concerns, or complaints.   Follow-up plan: RTC in 4 weeks.   Thank you for allowing me to participate in the care of this very pleasant patient.   Time Total: 15 minutes  Visit consisted of counseling and  education on  dealing with issues of symptom management in the setting of serious and potentially life-threatening illness.Greater than 50%  of this time was spent counseling and coordinating care related to the above assessment and plan.  Signed by: Remi Haggard, PharmD, BCPS, Nolon Bussing, CPP Hematology/Oncology Clinical Pharmacist Practitioner [place]/DB/AP Oral Chemotherapy Navigation Clinic [phone]  10/14/2023 2:02 PM

## 2023-10-14 ENCOUNTER — Inpatient Hospital Stay: Payer: Medicare Other | Admitting: Pharmacist

## 2023-10-14 ENCOUNTER — Inpatient Hospital Stay: Payer: Medicare Other | Attending: Oncology

## 2023-10-14 ENCOUNTER — Inpatient Hospital Stay: Payer: Medicare Other

## 2023-10-14 VITALS — BP 117/71 | HR 101

## 2023-10-14 DIAGNOSIS — C50912 Malignant neoplasm of unspecified site of left female breast: Secondary | ICD-10-CM | POA: Insufficient documentation

## 2023-10-14 DIAGNOSIS — Z79818 Long term (current) use of other agents affecting estrogen receptors and estrogen levels: Secondary | ICD-10-CM | POA: Diagnosis not present

## 2023-10-14 DIAGNOSIS — C50919 Malignant neoplasm of unspecified site of unspecified female breast: Secondary | ICD-10-CM

## 2023-10-14 DIAGNOSIS — Z5111 Encounter for antineoplastic chemotherapy: Secondary | ICD-10-CM | POA: Diagnosis not present

## 2023-10-14 DIAGNOSIS — Z170 Estrogen receptor positive status [ER+]: Secondary | ICD-10-CM | POA: Diagnosis not present

## 2023-10-14 DIAGNOSIS — C50911 Malignant neoplasm of unspecified site of right female breast: Secondary | ICD-10-CM | POA: Diagnosis not present

## 2023-10-14 LAB — CBC WITH DIFFERENTIAL/PLATELET
Abs Immature Granulocytes: 0.02 10*3/uL (ref 0.00–0.07)
Basophils Absolute: 0.1 10*3/uL (ref 0.0–0.1)
Basophils Relative: 2 %
Eosinophils Absolute: 0.1 10*3/uL (ref 0.0–0.5)
Eosinophils Relative: 3 %
HCT: 29.7 % — ABNORMAL LOW (ref 36.0–46.0)
Hemoglobin: 9.5 g/dL — ABNORMAL LOW (ref 12.0–15.0)
Immature Granulocytes: 0 %
Lymphocytes Relative: 29 %
Lymphs Abs: 1.4 10*3/uL (ref 0.7–4.0)
MCH: 33.7 pg (ref 26.0–34.0)
MCHC: 32 g/dL (ref 30.0–36.0)
MCV: 105.3 fL — ABNORMAL HIGH (ref 80.0–100.0)
Monocytes Absolute: 0.7 10*3/uL (ref 0.1–1.0)
Monocytes Relative: 15 %
Neutro Abs: 2.6 10*3/uL (ref 1.7–7.7)
Neutrophils Relative %: 51 %
Platelets: 179 10*3/uL (ref 150–400)
RBC: 2.82 MIL/uL — ABNORMAL LOW (ref 3.87–5.11)
RDW: 17.5 % — ABNORMAL HIGH (ref 11.5–15.5)
WBC: 5 10*3/uL (ref 4.0–10.5)
nRBC: 0 % (ref 0.0–0.2)

## 2023-10-14 LAB — COMPREHENSIVE METABOLIC PANEL
ALT: 23 U/L (ref 0–44)
AST: 29 U/L (ref 15–41)
Albumin: 4 g/dL (ref 3.5–5.0)
Alkaline Phosphatase: 79 U/L (ref 38–126)
Anion gap: 12 (ref 5–15)
BUN: 32 mg/dL — ABNORMAL HIGH (ref 8–23)
CO2: 27 mmol/L (ref 22–32)
Calcium: 9.7 mg/dL (ref 8.9–10.3)
Chloride: 103 mmol/L (ref 98–111)
Creatinine, Ser: 1.76 mg/dL — ABNORMAL HIGH (ref 0.44–1.00)
GFR, Estimated: 31 mL/min — ABNORMAL LOW (ref >60)
Glucose, Bld: 111 mg/dL — ABNORMAL HIGH (ref 70–99)
Potassium: 4.7 mmol/L (ref 3.5–5.1)
Sodium: 142 mmol/L (ref 135–145)
Total Bilirubin: 0.6 mg/dL (ref <1.2)
Total Protein: 7.3 g/dL (ref 6.5–8.1)

## 2023-10-14 MED ORDER — FULVESTRANT 250 MG/5ML IM SOSY
500.0000 mg | PREFILLED_SYRINGE | Freq: Once | INTRAMUSCULAR | Status: AC
  Administered 2023-10-14: 500 mg via INTRAMUSCULAR
  Filled 2023-10-14: qty 10

## 2023-10-15 LAB — CANCER ANTIGEN 27.29: CA 27.29: 78.8 U/mL — ABNORMAL HIGH (ref 0.0–38.6)

## 2023-10-19 ENCOUNTER — Encounter: Payer: Self-pay | Admitting: Oncology

## 2023-10-19 ENCOUNTER — Ambulatory Visit (INDEPENDENT_AMBULATORY_CARE_PROVIDER_SITE_OTHER): Payer: Medicare Other

## 2023-10-19 ENCOUNTER — Encounter: Payer: Self-pay | Admitting: Cardiovascular Disease

## 2023-10-19 ENCOUNTER — Ambulatory Visit: Payer: Medicare Other | Attending: Cardiovascular Disease | Admitting: Cardiovascular Disease

## 2023-10-19 VITALS — BP 112/62 | HR 104 | Ht 65.0 in | Wt 187.5 lb

## 2023-10-19 DIAGNOSIS — E1169 Type 2 diabetes mellitus with other specified complication: Secondary | ICD-10-CM | POA: Insufficient documentation

## 2023-10-19 DIAGNOSIS — I10 Essential (primary) hypertension: Secondary | ICD-10-CM | POA: Diagnosis not present

## 2023-10-19 DIAGNOSIS — I700 Atherosclerosis of aorta: Secondary | ICD-10-CM | POA: Insufficient documentation

## 2023-10-19 DIAGNOSIS — R0602 Shortness of breath: Secondary | ICD-10-CM | POA: Diagnosis not present

## 2023-10-19 DIAGNOSIS — E785 Hyperlipidemia, unspecified: Secondary | ICD-10-CM | POA: Diagnosis not present

## 2023-10-19 DIAGNOSIS — R000 Tachycardia, unspecified: Secondary | ICD-10-CM | POA: Insufficient documentation

## 2023-10-19 NOTE — Progress Notes (Unsigned)
Cardiology Office Note   Date:  10/19/2023   ID:  Cassidy Norris, Cassidy Norris 1950-12-15, MRN [id number]  PCP:  Smitty Cords, DO  Cardiologist:   Lorine Bears, MD   Chief Complaint  Patient presents with   New Patient (Initial Visit)    Tachycardia no complaints today. Meds reviewed verbally with pt.      History of Present Illness: Cassidy Norris is a 72 y.o. female who was referred for evaluation of sinus tachycardia.  She has a history of breast cancer status post bilateral mastectomy followed by chemotherapy with Adriamycin in 2015, multiple sclerosis, neuropathy, chronic kidney disease and arthritis.  She is a previous smoker. She was seen by me in  2016 for sinus tachycardia with no identifiable cause.  Echocardiogram showed normal LV systolic function with no significant valvular abnormalities.  She was treated with small dose carvedilol at that time. 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 She said she was on carvedilol for few years and then was switched to lisinopril for renal protection given that she is diabetic.  Over the last 6 to 12 months, she noted intermittent sinus tachycardia especially when she is at the doctor's office.  She usually feels palpitations but she denies chest pain, dizziness, syncope or presyncope.  She does complain of exertional dyspnea that has been relatively stable.  Her TSH was checked on multiple occasions most recently in November 2023 and was normal. , PND or lower extremity edema.   Past Medical History:  Diagnosis Date   Anemia    Arthritis not really diagnosis but have pain   B12 deficiency 07/23/2016   Will check levels to determine if injections are needed again. Await results. Recent CBC at [facility]   Blood transfusion without reported diagnosis 1983 had a miscarriage/dnc   Cancer (HCC) 12/30/2013   Multifocal disease: pT1c,m, N0(isolated tumor  cells) Er/ PR positive, Her 2 negative.   Cancer (HCC) 01/09/2014   Invasive lobular carcinoma, 2.2 cm, T2,N1 ER/PR positive, HER-2/neu negative   Cataract 2014?   Centrilobular emphysema (HCC) 10/27/2021   CKD (chronic kidney disease), stage IV (HCC) 07/13/2023   Complication of anesthesia 03/06/2022   Prolonged metabolism of Rocuronium during case.   Diabetes mellitus without complication (HCC)    Diffuse cystic mastopathy    Erosive esophagitis    Essential hypertension, benign 09/24/2017   Family history of malignant neoplasm of gastrointestinal tract 2012   Fractured elbow 08/28/2014   Gastroesophageal reflux disease    Hx of metabolic acidosis with increased anion gap    Increased heart rate    Multiple sclerosis (HCC)    Neuromuscular disorder (HCC)    neuropathy in bil feet - left is worse.   Obesity, unspecified    Peripheral neuropathy    Personal history of tobacco use, presenting hazards to health    Restless leg syndrome    Sleep apnea    uses CPAP   Special screening for malignant neoplasms, colon    Squamous cell carcinoma of skin 08/24/2022   SCCIS - scalp, ED&C   Squamous cell carcinoma of skin 07/20/2023   right of midline ant scalp post, tx with ED&C   Squamous cell carcinoma of skin 07/20/2023   right of midline ant scalp ant, tx with [facility]    Past Surgical History:  Procedure Laterality Date   BREAST BIOPSY Right 1973,2001   BREAST BIOPSY Right  11/07/2013   INVASIVE MAMMARY CARCINOMA , ER/PR positive Her2 negative   BREAST BIOPSY Left 11/27/2013   invasive lobular and DCIS   BREAST SURGERY Bilateral 01/09/2014   mastectomy   cataract surgery Left 08/13/2022   CHOLECYSTECTOMY     COLONOSCOPY  2010   Dr. Evette Cristal   COLONOSCOPY WITH PROPOFOL N/A 06/11/2015   Procedure: COLONOSCOPY WITH PROPOFOL;  Surgeon: Kieth Brightly, MD;  Location: [facility] ENDOSCOPY;  Service: Endoscopy;  Laterality: N/A;   COLONOSCOPY WITH PROPOFOL N/A 12/23/2021   Procedure:  COLONOSCOPY WITH PROPOFOL;  Surgeon: Toney Reil, MD;  Location: [facility] ENDOSCOPY;  Service: Gastroenterology;  Laterality: N/A;   DILATATION & CURETTAGE/HYSTEROSCOPY WITH MYOSURE N/A 07/12/2018   Procedure: DILATATION & CURETTAGE/HYSTEROSCOPY WITH MYOSURE WITH ENDOMETRIAL POLYPECTOMY;  Surgeon: Conard Novak, MD;  Location: [facility] ORS;  Service: Gynecology;  Laterality: N/A;   DILATION AND CURETTAGE OF UTERUS  1983   ESOPHAGOGASTRODUODENOSCOPY N/A 12/23/2021   Procedure: ESOPHAGOGASTRODUODENOSCOPY (EGD);  Surgeon: Toney Reil, MD;  Location: [facility] ENDOSCOPY;  Service: Gastroenterology;  Laterality: N/A;   JOINT REPLACEMENT Left 11/23/2015   right  2023   KNEE ARTHROPLASTY Right 03/06/2022   Procedure: COMPUTER ASSISTED TOTAL KNEE ARTHROPLASTY;  Surgeon: Donato Heinz, MD;  Location: [facility] ORS;  Service: Orthopedics;  Laterality: Right;   LAPAROSCOPIC BILATERAL SALPINGO OOPHERECTOMY N/A 08/04/2023   Procedure: LAPAROSCOPIC BILATERAL SALPINGO OOPHORECTOMY;  Surgeon: Leida Lauth, MD;  Location: [facility] ORS;  Service: Gynecology;  Laterality: N/A;   POLYPECTOMY  1989   PORTA CATH REMOVAL     PORTACATH PLACEMENT  2015   SKIN BIOPSY     on scalp   SPINE SURGERY  09/14/2014   Ruptured disk L4- L5   TOTAL KNEE ARTHROPLASTY Left 11/22/2015   Procedure: TOTAL KNEE ARTHROPLASTY;  Surgeon: Frederico Hamman, MD;  Location: [facility] OR;  Service: Orthopedics;  Laterality: Left;   TUBAL LIGATION     WISDOM TOOTH EXTRACTION  2006     Current Outpatient Medications  Medication Sig Dispense Refill   acetaminophen (TYLENOL) 500 MG tablet Take 1,000 mg by mouth 2 (two) times daily as needed for moderate pain or headache.     albuterol (VENTOLIN HFA) 108 (90 Base) MCG/ACT inhaler Inhale 2 puffs into the lungs every 4 (four) hours as needed for wheezing or shortness of breath. 8 g 2   baclofen (LIORESAL) 10 MG tablet Take 1 tablet (10 mg total) by mouth 3 (three) times daily as needed for muscle  spasms. 1080 tablet 0   benzonatate (TESSALON) 100 MG capsule TAKE 1 CAPSULE BY MOUTH THREE TIMES A DAY AS NEEDED FOR COUGH 30 capsule 0   calcium carbonate (OSCAL) 1500 (600 Ca) MG TABS tablet Take by mouth 2 (two) times daily with a meal.     dapagliflozin propanediol (FARXIGA) 10 MG TABS tablet Take 10 mg by mouth daily.     diclofenac Sodium (VOLTAREN) 1 % GEL Apply 2 g topically 3 (three) times daily as needed. 100 g 2   fluticasone (FLONASE) 50 MCG/ACT nasal spray PLACE 2 SPRAYS INTO BOTH NOSTRILS AT BEDTIME. 48 g 1   fulvestrant (FASLODEX) 250 MG/5ML injection Inject 500 mg into the muscle every 30 (thirty) days. One injection each buttock over 1-2 minutes. Warm prior to use.     Krill Oil 1000 MG CAPS Take 1,000 mg by mouth daily.     lisinopril (ZESTRIL) 5 MG tablet Take 1 tablet (5 mg total) by mouth daily. 90 tablet 3   loperamide (  IMODIUM A-D) 2 MG tablet Take 2 tablets (4 mg total) by mouth 4 (four) times daily as needed for diarrhea or loose stools. 30 tablet 0   metFORMIN (GLUCOPHAGE) 500 MG tablet Take 1 tablet (500 mg total) by mouth 2 (two) times daily with a meal. 180 tablet 3   omeprazole (PRILOSEC) 40 MG capsule TAKE 1 CAPSULE BY MOUTH TWICE A DAY 180 capsule 1   ondansetron (ZOFRAN) 8 MG tablet Take 1 tablet (8 mg total) by mouth every 8 (eight) hours as needed for nausea or vomiting. 20 tablet 0   prochlorperazine (COMPAZINE) 10 MG tablet Take 1 tablet (10 mg total) by mouth every 6 (six) hours as needed for nausea or vomiting. 20 tablet 1   ribociclib succ (KISQALI, 600 MG DOSE,) 200 MG Therapy Pack Take 3 tablets (600 mg total) by mouth daily. Take for 21 days on, 7 days off, repeat every 28 days. 63 tablet 5   sodium chloride (OCEAN) 0.65 % SOLN nasal spray Place 1 spray into both nostrils as needed for congestion.     traZODone (DESYREL) 50 MG tablet TAKE 1 TABLET (50 MG TOTAL) BY MOUTH AT BEDTIME AS NEEDED. FOR SLEEP 90 tablet 0   venlafaxine XR (EFFEXOR-XR) 75 MG 24 hr  capsule Take 1 capsule (75 mg total) by mouth daily with breakfast. 360 capsule 0   vitamin B-12 (CYANOCOBALAMIN) 1000 MCG tablet Take 2,500 mcg by mouth every Monday, Wednesday, and Friday.     Vitamin D, Cholecalciferol, 50 MCG (2000 UT) CAPS Take 2,000 Units by mouth every Monday, Wednesday, and Friday.     No current facility-administered medications for this visit.    Allergies:   Patient has no known allergies.    Social History:  The patient  reports that she quit smoking about 9 years ago. Her smoking use included cigarettes. She started smoking about 39 years ago. She has a 30 pack-year smoking history. She has quit using smokeless tobacco. She reports that she does not currently use alcohol. She reports that she does not use drugs.   Family History:  The patient's family history includes Breast cancer in her maternal aunt, paternal aunt, and paternal aunt; COPD in her mother; Colon cancer (age of onset: 72) in her father; Diabetes in her maternal grandmother; Hypertension in her sister; Lung cancer (age of onset: 42) in her mother; Pancreatic cancer in her paternal grandfather; Rectal cancer in her cousin, cousin, paternal uncle, and paternal uncle; Stomach cancer (age of onset: 24) in her father.    ROS:  Please see the history of present illness.   Otherwise, review of systems are positive for .   All other systems are reviewed and negative.    PHYSICAL EXAM: VS:  BP 112/62 (BP Location: Right Arm, Patient Position: Sitting, Cuff Size: Normal)   Pulse (!) 104   Ht 5\' 5"  (1.651 m)   Wt 187 lb 8 oz (85 kg)   LMP 12/22/1999 (Approximate)   SpO2 98%   BMI 31.20 kg/m  , BMI Body mass index is 31.2 kg/m. GEN: Well nourished, well developed, in no acute distress  HEENT: normal  Neck: no JVD, carotid bruits, or masses Cardiac: RRR; no rubs, or gallops,no edema .  1 out of 6 systolic murmur in the aortic area. Respiratory:  clear to auscultation bilaterally, normal work of  breathing GI: soft, nontender, nondistended, + BS MS: no deformity or atrophy  Skin: warm and dry, no rash Neuro:  Strength and sensation  are intact Psych: euthymic mood, full affect   EKG:  EKG is ordered today. The ekg ordered today demonstrates : Sinus tachycardia When compared with ECG of 27-Jul-2023 08:42, No significant change was found    Recent Labs: 10/21/2022: TSH 0.84 09/16/2023: Magnesium 2.0 10/14/2023: ALT 23; BUN 32; Creatinine, Ser 1.76; Hemoglobin 9.5; Platelets 179; Potassium 4.7; Sodium 142    Lipid Panel    Component Value Date/Time   CHOL 194 10/21/2022 0815   CHOL 159 01/12/2017 1329   TRIG 307 (H) 10/21/2022 0815   TRIG 172 (H) 01/12/2017 1329   HDL 35 (L) 10/21/2022 0815   HDL 28 (L) 08/28/2016 1355   CHOLHDL 5.5 (H) 10/21/2022 0815   VLDL 34 (H) 01/12/2017 1329   LDLCALC 118 (H) 10/21/2022 0815      Wt Readings from Last 3 Encounters:  10/19/23 187 lb 8 oz (85 kg)  09/28/23 186 lb (84.4 kg)  09/16/23 184 lb 11.2 oz (83.8 kg)          10/19/2023    2:13 PM  PAD Screen  Previous PAD dx? No  Previous surgical procedure? No  Pain with walking? No  Feet/toe relief with dangling? No  Painful, non-healing ulcers? No  Extremities discolored? No      ASSESSMENT AND PLAN:  1.  Sinus tachycardia and palpitations: She had this issue in the past and it is possible she might have inappropriate sinus tachycardia.  However, we have to exclude cardiomyopathy or other structural cardiac abnormalities especially with history of radiation and chemotherapy with anthracycline.  I requested an echocardiogram.  In addition, I will obtain a 1 week ZIO monitor.  We could consider placing her on small dose metoprolol but her blood pressure is on the low side. She does have underlying anemia with a hemoglobin of 9.5 that has been chronic and might be somewhat contributing.  2.  Hyperlipidemia: Consider treatment with a statin to achieve a target LDL of less  than 70 given that she is diabetic.  Most recent LDL was 118.  3.  Chronic kidney disease: Most recent creatinine was 1.76 with a GFR of 31.  This has been relatively stable.  She is on small dose lisinopril.  4.  Recurrent metastatic breast cancer.  Currently on chemotherapy.  5.  Multiple sclerosis : Treatment per neurology.    Disposition:   FU with me in 2 months  Signed,  Lorine Bears, MD  10/19/2023 2:42 PM    [facility]

## 2023-10-19 NOTE — Patient Instructions (Signed)
Medication Instructions:  No changes *If you need a refill on your cardiac medications before your next appointment, please call your pharmacy*   Lab Work: None ordered If you have labs (blood work) drawn today and your tests are completely normal, you will receive your results only by: MyChart Message (if you have MyChart) OR A paper copy in the mail If you have any lab test that is abnormal or we need to change your treatment, we will call you to review the results.   Testing/Procedures: Your physician has requested that you have an echocardiogram. Echocardiography is a painless test that uses sound waves to create images of your heart. It provides your doctor with information about the size and shape of your heart and how well your heart's chambers and valves are working.   You may receive an ultrasound enhancing agent through an IV if needed to better visualize your heart during the echo. This procedure takes approximately one hour.  There are no restrictions for this procedure.  This will take place at [address] [facility] [address], [place] [address]  Please note: We ask at that you not bring children with you during ultrasound (echo/ vascular) testing. Due to room size and safety concerns, children are not allowed in the ultrasound rooms during exams. Our front office staff cannot provide observation of children in our lobby area while testing is being conducted. An adult accompanying a patient to their appointment will only be allowed in the ultrasound room at the discretion of the ultrasound technician under special circumstances. We apologize for any inconvenience.    Follow-Up: At [facility], you and your health needs are our priority.  As part of our continuing mission to provide you with exceptional heart care, we have created designated Provider Care Teams.  These Care Teams include your primary Cardiologist (physician) and Advanced Practice  Providers (APPs -  Physician Assistants and Nurse Practitioners) who all work together to provide you with the care you need, when you need it.  We recommend signing up for the patient portal called "MyChart".  Sign up information is provided on this After Visit Summary.  MyChart is used to connect with patients for Virtual Visits (Telemedicine).  Patients are able to view lab/test results, encounter notes, upcoming appointments, etc.  Non-urgent messages can be sent to your provider as well.   To learn more about what you can do with MyChart, go to [email or web address].    Your next appointment:   2 month(s)  Provider:   You may see Dr. Kirke Corin or one of the following Advanced Practice Providers on your designated Care Team:   Nicolasa Ducking, NP Eula Listen, PA-C Cadence Fransico Michael, PA-C Charlsie Quest, NP Carlos Levering, NP    Other Instructions Cassidy Norris- Long Term Monitor Instructions  Your physician has requested you wear a ZIO patch monitor for 7 days.  This is a single patch monitor. [employer] supplies one patch monitor per enrollment. Additional stickers are not available. Please do not apply patch if you will be having a Nuclear Stress Test,  Echocardiogram, Cardiac CT, MRI, or Chest Xray during the period you would be wearing the  monitor. The patch cannot be worn during these tests. You cannot remove and re-apply the  ZIO XT patch monitor.  Your ZIO patch monitor will be mailed 3 day USPS to your address on file. It may take 3-5 days  to receive your monitor after you have been enrolled.  Once you have  received your monitor, please review the enclosed instructions. Your monitor  has already been registered assigning a specific monitor serial # to you.  Billing and Patient Assistance Program Information  We have supplied [employer] with any of your insurance information on file for billing purposes. [employer] offers a sliding scale Patient Assistance Program for patients that do not  have  insurance, or whose insurance does not completely cover the cost of the ZIO monitor.  You must apply for the Patient Assistance Program to qualify for this discounted rate.  To apply, please call [employer] at [phone], select option 4, select option 2, ask to apply for  Patient Assistance Program. Meredeth Ide will ask your household income, and how many people  are in your household. They will quote your out-of-pocket cost based on that information.  [employer] will also be able to set up a 49-month, interest-free payment plan if needed.  Applying the monitor   Shave hair from upper left chest.  Hold abrader disc by orange tab. Rub abrader in 40 strokes over the upper left chest as  indicated in your monitor instructions.  Clean area with 4 enclosed alcohol pads. Let dry.  Apply patch as indicated in monitor instructions. Patch will be placed under collarbone on left  side of chest with arrow pointing upward.  Rub patch adhesive wings for 2 minutes. Remove white label marked "1". Remove the white  label marked "2". Rub patch adhesive wings for 2 additional minutes.  While looking in a mirror, press and release button in center of patch. A small green light will  flash 3-4 times. This will be your only indicator that the monitor has been turned on.  Do not shower for the first 24 hours. You may shower after the first 24 hours.  Press the button if you feel a symptom. You will hear a small click. Record Date, Time and  Symptom in the Patient Logbook.  When you are ready to remove the patch, follow instructions on the last 2 pages of Patient  Logbook. Stick patch monitor onto the last page of Patient Logbook.  Place Patient Logbook in the blue and white box. Use locking tab on box and tape box closed  securely. The blue and white box has prepaid postage on it. Please place it in the mailbox as  soon as possible. Your physician should have your test results approximately 7 days after the   monitor has been mailed back to [facility].  Call [facility] Customer Care at [phone] if you have questions regarding  your ZIO XT patch monitor. Call them immediately if you see an orange light blinking on your  monitor.  If your monitor falls off in less than 4 days, contact our Monitor department at [phone].  If your monitor becomes loose or falls off after 4 days call [employer] at [phone] for  suggestions on securing your monitor

## 2023-10-20 DIAGNOSIS — I10 Essential (primary) hypertension: Secondary | ICD-10-CM | POA: Diagnosis not present

## 2023-10-20 DIAGNOSIS — C50919 Malignant neoplasm of unspecified site of unspecified female breast: Secondary | ICD-10-CM | POA: Diagnosis not present

## 2023-10-20 DIAGNOSIS — N184 Chronic kidney disease, stage 4 (severe): Secondary | ICD-10-CM | POA: Diagnosis not present

## 2023-10-20 DIAGNOSIS — R809 Proteinuria, unspecified: Secondary | ICD-10-CM | POA: Diagnosis not present

## 2023-10-20 DIAGNOSIS — I129 Hypertensive chronic kidney disease with stage 1 through stage 4 chronic kidney disease, or unspecified chronic kidney disease: Secondary | ICD-10-CM | POA: Diagnosis not present

## 2023-10-20 DIAGNOSIS — N189 Chronic kidney disease, unspecified: Secondary | ICD-10-CM | POA: Diagnosis not present

## 2023-10-23 DIAGNOSIS — R000 Tachycardia, unspecified: Secondary | ICD-10-CM

## 2023-10-25 ENCOUNTER — Other Ambulatory Visit: Payer: Medicare Other

## 2023-10-25 DIAGNOSIS — I10 Essential (primary) hypertension: Secondary | ICD-10-CM

## 2023-10-25 DIAGNOSIS — E785 Hyperlipidemia, unspecified: Secondary | ICD-10-CM | POA: Diagnosis not present

## 2023-10-25 DIAGNOSIS — E559 Vitamin D deficiency, unspecified: Secondary | ICD-10-CM

## 2023-10-25 DIAGNOSIS — E1169 Type 2 diabetes mellitus with other specified complication: Secondary | ICD-10-CM | POA: Diagnosis not present

## 2023-10-26 LAB — VITAMIN D 25 HYDROXY (VIT D DEFICIENCY, FRACTURES): Vit D, 25-Hydroxy: 53 ng/mL (ref 30–100)

## 2023-10-26 LAB — LIPID PANEL
Cholesterol: 252 mg/dL — ABNORMAL HIGH (ref <200)
HDL: 44 mg/dL — ABNORMAL LOW (ref >=50)
Non-HDL Cholesterol (Calc): 208 mg/dL — ABNORMAL HIGH (ref <130)
Total CHOL/HDL Ratio: 5.7 (calc) — ABNORMAL HIGH (ref <5.0)
Triglycerides: 710 mg/dL — ABNORMAL HIGH (ref <150)

## 2023-10-26 LAB — HEMOGLOBIN A1C
Hgb A1c MFr Bld: 6 %{Hb} — ABNORMAL HIGH (ref <5.7)
Mean Plasma Glucose: 126 mg/dL
eAG (mmol/L): 7 mmol/L

## 2023-10-26 LAB — TSH: TSH: 1 m[IU]/L (ref 0.40–4.50)

## 2023-11-01 ENCOUNTER — Encounter: Payer: Self-pay | Admitting: Family Medicine

## 2023-11-01 NOTE — Telephone Encounter (Signed)
Appt tomorrow  Saralyn Pilar, DO [facility] 11/01/2023, 7:08 PM

## 2023-11-02 ENCOUNTER — Encounter: Payer: Self-pay | Admitting: Family Medicine

## 2023-11-02 ENCOUNTER — Ambulatory Visit (INDEPENDENT_AMBULATORY_CARE_PROVIDER_SITE_OTHER): Payer: Medicare Other | Admitting: Family Medicine

## 2023-11-02 VITALS — BP 108/60 | HR 90 | Ht 65.0 in | Wt 189.0 lb

## 2023-11-02 DIAGNOSIS — E1169 Type 2 diabetes mellitus with other specified complication: Secondary | ICD-10-CM

## 2023-11-02 DIAGNOSIS — E785 Hyperlipidemia, unspecified: Secondary | ICD-10-CM

## 2023-11-02 DIAGNOSIS — N184 Chronic kidney disease, stage 4 (severe): Secondary | ICD-10-CM | POA: Diagnosis not present

## 2023-11-02 DIAGNOSIS — I10 Essential (primary) hypertension: Secondary | ICD-10-CM | POA: Diagnosis not present

## 2023-11-02 NOTE — Progress Notes (Signed)
Subjective:    Patient ID: Cassidy Norris, female    DOB: 08/25/1951, 72 y.o.   MRN: [id number]  Cassidy Norris is a 72 y.o. female presenting on 11/02/2023 for Medical Management of Chronic Issues (Follow up patient does have a cough)   HPI  Discussed the use of AI scribe software for clinical note transcription with the patient, who gave verbal consent to proceed.  Here for Annual Physical and Lab Review  The patient's recent blood work revealed high triglycerides, despite fasting prior to the test. They speculated that their elevated levels might be due to their Thanksgiving diet. Their blood sugar level had slightly increased from 5.8 to 6.0, but was still within the controlled diabetes range. The patient expressed concern about the cost of their medications, particularly Farxiga, which they may not be able to afford next year.  The patient also reported a persistent cough and a runny nose, which they attributed to allergies. They had been experiencing these symptoms since helping clean a house that had been sitting for a long time. Despite these symptoms, their lungs sounded clear during the examination.   Recurrent, metastatic ER/PR, HER2 negative Breast Cancer Followed by Dr Orlie Dakin, [facility] Oncology PET Scan from Feb 2023, and biopsy proven recurrent disease Initial CA 27-29 260.1, now stable between 86-91 On oral chemotherapy Kisqali   Anemia, chronic stable unchanged Hgb 9.5  Hypertension Creatinine CKD-IV Followed by Oncology - on chemotherapy Followed by Nephrology [facility] Dr Thedore Mins Cr 1.76  Upcoming visit with Cardiology, BP down and HR is up   Right Knee Right knee incision with some issues with healing now improved, had small cystlike swelling   CHRONIC DM, Type 2: A1c 6.0 CBG Meds: Metformin 500 BID Reports good compliance. Tolerating well w/o side-effects Currently on ACEi Lifestyle: - Diet (admits starches were increasing her sugars now improving)   Denies hypoglycemia, polyuria, visual changes, numbness or tingling. Upcoming Diabetic Eye Exam 11/26/23   Cataracts Followed by Eye Doctor   HYPERLIPIDEMIA: - Reports concerns with significantly elevated TG 700, unable to read LDL 09/2023 On Krill Oil, not on statin due to myopathy    Chemotherapy Induced Neuropathy Chronic problem with nerve pain Previous treatment not effective   Insomnia Controlled on Trazodone 50mg  nightly, refills due today   Health Maintenance: Declines Vaccines.     11/02/2023    9:24 AM 09/28/2023    2:30 PM 07/15/2023    8:33 AM  Depression screen PHQ 2/9  Decreased Interest 0 1 0  Down, Depressed, Hopeless 0 0 0  PHQ - 2 Score 0 1 0  Altered sleeping 2 0 0  Tired, decreased energy 2 1 0  Change in appetite 0 1 0  Feeling bad or failure about yourself  0 0 0  Trouble concentrating 0 0 0  Moving slowly or fidgety/restless 0 0 0  Suicidal thoughts 0 0 0  PHQ-9 Score 4 3 0  Difficult doing work/chores  Not difficult at all Not difficult at all       11/02/2023    9:24 AM 09/28/2023    2:30 PM 07/13/2023    8:40 AM 04/30/2023    8:22 AM  GAD 7 : Generalized Anxiety Score  Nervous, Anxious, on Edge 0 0 1 0  Control/stop worrying 0 0 1 0  Worry too much - different things 0 0 0 0  Trouble relaxing 0 0 0 2  Restless 0 0 0 0  Easily annoyed or irritable 1  1 1 1   Afraid - awful might happen 0 0 0 0  Total GAD 7 Score 1 1 3 3   Anxiety Difficulty   Not difficult at all Not difficult at all     Past Medical History:  Diagnosis Date   Anemia    Arthritis not really diagnosis but have pain   B12 deficiency 07/23/2016   Will check levels to determine if injections are needed again. Await results. Recent CBC at Onc WNL   Blood transfusion without reported diagnosis 1983 had a miscarriage/dnc   Cancer (HCC) 12/30/2013   Multifocal disease: pT1c,m, N0(isolated tumor cells) Er/ PR positive, Her 2 negative.   Cancer (HCC) 01/09/2014   Invasive  lobular carcinoma, 2.2 cm, T2,N1 ER/PR positive, HER-2/neu negative   Cataract 2014?   Centrilobular emphysema (HCC) 10/27/2021   CKD (chronic kidney disease), stage IV (HCC) 07/13/2023   Complication of anesthesia 03/06/2022   Prolonged metabolism of Rocuronium during case.   Diabetes mellitus without complication (HCC)    Diffuse cystic mastopathy    Erosive esophagitis    Essential hypertension, benign 09/24/2017   Family history of malignant neoplasm of gastrointestinal tract 2012   Fractured elbow 08/28/2014   Gastroesophageal reflux disease    Hx of metabolic acidosis with increased anion gap    Increased heart rate    Multiple sclerosis (HCC)    Neuromuscular disorder (HCC)    neuropathy in bil feet - left is worse.   Obesity, unspecified    Peripheral neuropathy    Personal history of tobacco use, presenting hazards to health    Restless leg syndrome    Sleep apnea    uses CPAP   Special screening for malignant neoplasms, colon    Squamous cell carcinoma of skin 08/24/2022   SCCIS - scalp, ED&C   Squamous cell carcinoma of skin 07/20/2023   right of midline ant scalp post, tx with ED&C   Squamous cell carcinoma of skin 07/20/2023   right of midline ant scalp ant, tx with [facility]   Past Surgical History:  Procedure Laterality Date   BREAST BIOPSY Right 1973,2001   BREAST BIOPSY Right 11/07/2013   INVASIVE MAMMARY CARCINOMA , ER/PR positive Her2 negative   BREAST BIOPSY Left 11/27/2013   invasive lobular and DCIS   BREAST SURGERY Bilateral 01/09/2014   mastectomy   cataract surgery Left 08/13/2022   CHOLECYSTECTOMY     COLONOSCOPY  2010   Dr. Evette Cristal   COLONOSCOPY WITH PROPOFOL N/A 06/11/2015   Procedure: COLONOSCOPY WITH PROPOFOL;  Surgeon: Kieth Brightly, MD;  Location: [facility] ENDOSCOPY;  Service: Endoscopy;  Laterality: N/A;   COLONOSCOPY WITH PROPOFOL N/A 12/23/2021   Procedure: COLONOSCOPY WITH PROPOFOL;  Surgeon: Toney Reil, MD;  Location: [facility]  ENDOSCOPY;  Service: Gastroenterology;  Laterality: N/A;   DILATATION & CURETTAGE/HYSTEROSCOPY WITH MYOSURE N/A 07/12/2018   Procedure: DILATATION & CURETTAGE/HYSTEROSCOPY WITH MYOSURE WITH ENDOMETRIAL POLYPECTOMY;  Surgeon: Conard Novak, MD;  Location: [facility] ORS;  Service: Gynecology;  Laterality: N/A;   DILATION AND CURETTAGE OF UTERUS  1983   ESOPHAGOGASTRODUODENOSCOPY N/A 12/23/2021   Procedure: ESOPHAGOGASTRODUODENOSCOPY (EGD);  Surgeon: Toney Reil, MD;  Location: [facility] ENDOSCOPY;  Service: Gastroenterology;  Laterality: N/A;   JOINT REPLACEMENT Left 11/23/2015   right  2023   KNEE ARTHROPLASTY Right 03/06/2022   Procedure: COMPUTER ASSISTED TOTAL KNEE ARTHROPLASTY;  Surgeon: Donato Heinz, MD;  Location: [facility] ORS;  Service: Orthopedics;  Laterality: Right;   LAPAROSCOPIC BILATERAL SALPINGO OOPHERECTOMY N/A 08/04/2023  Procedure: LAPAROSCOPIC BILATERAL SALPINGO OOPHORECTOMY;  Surgeon: Leida Lauth, MD;  Location: [facility] ORS;  Service: Gynecology;  Laterality: N/A;   POLYPECTOMY  1989   PORTA CATH REMOVAL     PORTACATH PLACEMENT  2015   SKIN BIOPSY     on scalp   SPINE SURGERY  09/14/2014   Ruptured disk L4- L5   TOTAL KNEE ARTHROPLASTY Left 11/22/2015   Procedure: TOTAL KNEE ARTHROPLASTY;  Surgeon: Frederico Hamman, MD;  Location: [facility] OR;  Service: Orthopedics;  Laterality: Left;   TUBAL LIGATION     WISDOM TOOTH EXTRACTION  2006   Social History   Socioeconomic History   Marital status: Widowed    Spouse name: Not on file   Number of children: Not on file   Years of education: some college   Highest education level: 12th grade  Occupational History   Occupation: retired  Tobacco Use   Smoking status: Former    Current packs/day: 0.00    Average packs/day: 1 pack/day for 30.0 years (30.0 ttl pk-yrs)    Types: Cigarettes    Start date: 12/12/1983    Quit date: 12/11/2013    Years since quitting: 9.8   Smokeless tobacco: Former  Building services engineer status:  Never Used  Substance and Sexual Activity   Alcohol use: Not Currently   Drug use: No   Sexual activity: Not Currently    Birth control/protection: Post-menopausal  Other Topics Concern   Not on file  Social History Narrative   Right handed   Drinks caffeine   One story home   Social Determinants of Health   Financial Resource Strain: Low Risk  (07/15/2023)   Overall Financial Resource Strain (CARDIA)    Difficulty of Paying Living Expenses: Not hard at all  Food Insecurity: No Food Insecurity (07/15/2023)   Hunger Vital Sign    Worried About Running Out of Food in the Last Year: Never true    Ran Out of Food in the Last Year: Never true  Transportation Needs: No Transportation Needs (07/15/2023)   PRAPARE - Transportation    Lack of Transportation (Medical): No    Lack of Transportation (Non-Medical): No  Physical Activity: Insufficiently Active (07/15/2023)   Exercise Vital Sign    Days of Exercise per Week: 5 days    Minutes of Exercise per Session: 20 min  Stress: No Stress Concern Present (07/15/2023)   [employer] of Occupational Health - Occupational Stress Questionnaire    Feeling of Stress : Only a little  Social Connections: Moderately Isolated (07/15/2023)   Social Connection and Isolation Panel [NHANES]    Frequency of Communication with Friends and Family: Three times a week    Frequency of Social Gatherings with Friends and Family: Twice a week    Attends Religious Services: More than 4 times per year    Active Member of [employer] or Organizations: No    Attends Banker Meetings: Never    Marital Status: Widowed  Intimate Partner Violence: Not At Risk (07/15/2023)   Humiliation, Afraid, Rape, and Kick questionnaire    Fear of Current or Ex-Partner: No    Emotionally Abused: No    Physically Abused: No    Sexually Abused: No   Family History  Problem Relation Age of Onset   Lung cancer Mother 29   COPD Mother    Colon cancer Father 81    Stomach cancer Father 57   Hypertension Sister    Breast cancer Maternal Aunt  dx over 50   Breast cancer Paternal Aunt        dx 29s   Breast cancer Paternal Aunt        dx 13s   Rectal cancer Paternal Uncle        dx 39s   Rectal cancer Paternal Uncle        dx 43s   Diabetes Maternal Grandmother    Pancreatic cancer Paternal Grandfather    Rectal cancer Cousin        dx 73s-60s   Rectal cancer Cousin        dx >50   Current Outpatient Medications on File Prior to Visit  Medication Sig   acetaminophen (TYLENOL) 500 MG tablet Take 1,000 mg by mouth 2 (two) times daily as needed for moderate pain or headache.   baclofen (LIORESAL) 10 MG tablet Take 1 tablet (10 mg total) by mouth 3 (three) times daily as needed for muscle spasms.   calcium carbonate (OSCAL) 1500 (600 Ca) MG TABS tablet Take by mouth 2 (two) times daily with a meal.   dapagliflozin propanediol (FARXIGA) 10 MG TABS tablet Take 10 mg by mouth daily.   diclofenac Sodium (VOLTAREN) 1 % GEL Apply 2 g topically 3 (three) times daily as needed.   fluticasone (FLONASE) 50 MCG/ACT nasal spray PLACE 2 SPRAYS INTO BOTH NOSTRILS AT BEDTIME.   fulvestrant (FASLODEX) 250 MG/5ML injection Inject 500 mg into the muscle every 30 (thirty) days. One injection each buttock over 1-2 minutes. Warm prior to use.   Krill Oil 1000 MG CAPS Take 1,000 mg by mouth daily.   lisinopril (ZESTRIL) 5 MG tablet Take 1 tablet (5 mg total) by mouth daily.   loperamide (IMODIUM A-D) 2 MG tablet Take 2 tablets (4 mg total) by mouth 4 (four) times daily as needed for diarrhea or loose stools.   metFORMIN (GLUCOPHAGE) 500 MG tablet Take 1 tablet (500 mg total) by mouth 2 (two) times daily with a meal.   omeprazole (PRILOSEC) 40 MG capsule TAKE 1 CAPSULE BY MOUTH TWICE A DAY   ondansetron (ZOFRAN) 8 MG tablet Take 1 tablet (8 mg total) by mouth every 8 (eight) hours as needed for nausea or vomiting.   prochlorperazine (COMPAZINE) 10 MG tablet Take 1  tablet (10 mg total) by mouth every 6 (six) hours as needed for nausea or vomiting.   ribociclib succ (KISQALI, 600 MG DOSE,) 200 MG Therapy Pack Take 3 tablets (600 mg total) by mouth daily. Take for 21 days on, 7 days off, repeat every 28 days.   sodium chloride (OCEAN) 0.65 % SOLN nasal spray Place 1 spray into both nostrils as needed for congestion.   traZODone (DESYREL) 50 MG tablet TAKE 1 TABLET (50 MG TOTAL) BY MOUTH AT BEDTIME AS NEEDED. FOR SLEEP   venlafaxine XR (EFFEXOR-XR) 75 MG 24 hr capsule Take 1 capsule (75 mg total) by mouth daily with breakfast.   vitamin B-12 (CYANOCOBALAMIN) 1000 MCG tablet Take 2,500 mcg by mouth every Monday, Wednesday, and Friday.   Vitamin D, Cholecalciferol, 50 MCG (2000 UT) CAPS Take 2,000 Units by mouth every Monday, Wednesday, and Friday.   albuterol (VENTOLIN HFA) 108 (90 Base) MCG/ACT inhaler Inhale 2 puffs into the lungs every 4 (four) hours as needed for wheezing or shortness of breath. (Patient not taking: Reported on 11/02/2023)   No current facility-administered medications on file prior to visit.    Review of Systems Per HPI unless specifically indicated above  Objective:    BP 108/60   Pulse 90   Ht 5\' 5"  (1.651 m)   Wt 189 lb (85.7 kg)   LMP 12/22/1999 (Approximate)   BMI 31.45 kg/m   Wt Readings from Last 3 Encounters:  11/02/23 189 lb (85.7 kg)  10/19/23 187 lb 8 oz (85 kg)  09/28/23 186 lb (84.4 kg)    Physical Exam Vitals and nursing note reviewed.  Constitutional:      General: She is not in acute distress.    Appearance: She is well-developed. She is not diaphoretic.     Comments: Well-appearing, comfortable, cooperative  HENT:     Head: Normocephalic and atraumatic.  Eyes:     General:        Right eye: No discharge.        Left eye: No discharge.     Conjunctiva/sclera: Conjunctivae normal.  Neck:     Thyroid: No thyromegaly.  Cardiovascular:     Rate and Rhythm: Normal rate and regular rhythm.     Heart  sounds: Normal heart sounds. No murmur heard. Pulmonary:     Effort: Pulmonary effort is normal. No respiratory distress.     Breath sounds: Normal breath sounds. No wheezing or rales.  Musculoskeletal:        General: Normal range of motion.     Cervical back: Normal range of motion and neck supple.  Lymphadenopathy:     Cervical: No cervical adenopathy.  Skin:    General: Skin is warm and dry.     Findings: No erythema or rash.  Neurological:     Mental Status: She is alert and oriented to person, place, and time.  Psychiatric:        Behavior: Behavior normal.     Comments: Well groomed, good eye contact, normal speech and thoughts     Diabetic Foot Exam - Simple   Simple Foot Form Diabetic Foot exam was performed with the following findings: Yes 11/02/2023  8:41 AM  Visual Inspection See comments: Yes Sensation Testing Intact to touch and monofilament testing bilaterally: Yes Pulse Check Posterior Tibialis and Dorsalis pulse intact bilaterally: Yes Comments Bilateral hammer toes, no other structural abnormality. No ulceration, no callus. Intact monofilament.      06/21/23  Urine Albumin / Creatinine Ratio Order: [id number] Component Ref Range & Units 4 mo ago  Creatinine, Ur 20 - 275 mg/dL 74  Urine Microalbumin See Note: mg/dL 0.9  Comment: Reference Range:                                  Reference Range                                 Not established  Microalb/Creat Ratio <30 mg/g creat 12  Comment:             The ADA defines abnormalities in albumin      excretion as follows:             Albuminuria Category        Result (mg/g creatinine)             Normal to Mildly increased   <30      Moderately increased         30-299      Severely increased           >  OR = 300             The ADA recommends that at least two of three      specimens collected within a 3-6 month period be      abnormal before considering a patient to be      within a  diagnostic category.    Results for orders placed or performed in visit on 11/02/23  Microalbumin, urine  Result Value Ref Range   Microalb, Ur 0.9   Microalbumin / creatinine urine ratio  Result Value Ref Range   Microalb Creat Ratio 12   Protein / creatinine ratio, urine  Result Value Ref Range   Creatinine, Urine 74       Assessment & Plan:   Problem List Items Addressed This Visit     CKD (chronic kidney disease), stage IV (HCC)   Essential hypertension, benign   Hyperlipidemia associated with type 2 diabetes mellitus (HCC)   Type 2 diabetes mellitus with other specified complication (HCC) - Primary     Updated Health Maintenance information Reviewed recent lab results with patient Encouraged improvement to lifestyle with diet and exercise Goal of weight loss  Hypertriglyceridemia Noted significant increase in triglycerides to 710. Discussed potential causes including dietary intake and medication. No clear cause identified. -Repeat lipid panel on 11/11/2023. -Consider Zetia vs Fibrate vs PCSK9i if triglycerides remain elevated, pending cost and insurance coverage.  Diabetes Mellitus Type 2 HbA1c increased slightly to 6.0, but still within target range for controlled diabetes. -Continue current management. -Repeat HbA1c in 6 months.  Chronic Cough and Nasal Congestion Ongoing symptoms, possibly related to allergies. No signs of infection or respiratory distress on examination. -Consider over-the-counter antihistamines for symptom management.  Medication Management Discussed upcoming changes in insurance and potential impact on medication costs. -Plan to adjust medication regimen as needed based on insurance coverage.  Follow-up Plan for 36-month follow-up visit. In the interim, patient can contact office for medication adjustments as needed.      Consider Pharmacy referral for cost savings - Farxiga vs Jardiance in future Ask Dr Orlie Dakin if he can repeat  Cholesterol on 11/11/23   Orders Placed This Encounter  Procedures   Microalbumin, urine    This external order was created through the Results Console.   Microalbumin / creatinine urine ratio    This external order was created through the Results Console.   Protein / creatinine ratio, urine    This external order was created through the Results Console.    No orders of the defined types were placed in this encounter.    Follow up plan: Return in about 6 months (around 05/02/2024) for 6 month DM A1c.  Upcoming apt 11/11/23 w/ Oncology labs ask Dr Orlie Dakin to add cholesterol  Saralyn Pilar, DO [facility] 11/02/2023, 8:15 AM

## 2023-11-02 NOTE — Patient Instructions (Addendum)
Thank you for coming to the office today.  I will ask Dr Orlie Dakin to add a cholesterol lab for you next time if possible.  Triglyceride >700 is a concern.  Recent Labs    04/30/23 0906 10/25/23 0802  HGBA1C 5.8* 6.0*   Please check with your other doctors / pharmacy on the Farxiga - if the cost is very high, or not covered, we can work with our Scientist, research (life sciences).  Please schedule a Follow-up Appointment to: Return in about 6 months (around 05/02/2024) for 6 month DM A1c.  If you have any other questions or concerns, please feel free to call the office or send a message through MyChart. You may also schedule an earlier appointment if necessary.  Additionally, you may be receiving a survey about your experience at our office within a few days to 1 week by e-mail or mail. We value your feedback.  Saralyn Pilar, DO [facility], [place]

## 2023-11-03 ENCOUNTER — Telehealth: Payer: Self-pay | Admitting: Family Medicine

## 2023-11-03 DIAGNOSIS — E785 Hyperlipidemia, unspecified: Secondary | ICD-10-CM

## 2023-11-03 DIAGNOSIS — E781 Pure hyperglyceridemia: Secondary | ICD-10-CM

## 2023-11-03 NOTE — Telephone Encounter (Signed)
Please notify patient of the following:  I did reach out to Dr Orlie Dakin and [facility].  Unfortunately they are unable to add extra labs to the orders.  She would need to get the lab drawn with one of my orders here at our office anytime she is ready.  Order is in.  Let me know if further questions or concerns!  Saralyn Pilar, DO [facility] 11/03/2023, 7:04 PM

## 2023-11-03 NOTE — Telephone Encounter (Signed)
Oral Oncology Patient Advocate Encounter   **NPAF PAP to [facility] in Jan 2025**  Was successful in securing patient a $6,000.00 grant from [employer] to provide copayment coverage for Kisqali.  This will keep the out of pocket expense at $0.     I have spoken with the patient.    The billing information is as follows and has been shared with [facility].   Member ID: [id number] Group ID: [id number] RxBin: [id number] PCN: [id number] Dates of Eligibility: 10/05/23 through 10/04/24  Fund name:  Metastatic Breast Cancer.   Ardeen Fillers, CPhT Oncology Pharmacy Patient Advocate  [facility]  [phone] (phone) [phone] (fax) 11/03/2023

## 2023-11-04 DIAGNOSIS — R000 Tachycardia, unspecified: Secondary | ICD-10-CM | POA: Diagnosis not present

## 2023-11-08 ENCOUNTER — Encounter: Payer: Self-pay | Admitting: Family Medicine

## 2023-11-08 NOTE — Telephone Encounter (Signed)
 Care team updated and letter sent for eye exam notes.

## 2023-11-11 ENCOUNTER — Inpatient Hospital Stay: Payer: Medicare Other

## 2023-11-11 ENCOUNTER — Inpatient Hospital Stay (HOSPITAL_BASED_OUTPATIENT_CLINIC_OR_DEPARTMENT_OTHER): Payer: Medicare Other | Admitting: Oncology

## 2023-11-11 ENCOUNTER — Inpatient Hospital Stay: Payer: Medicare Other | Attending: Oncology

## 2023-11-11 VITALS — BP 119/62 | HR 63 | Temp 97.2°F | Resp 16 | Ht 65.0 in | Wt 189.0 lb

## 2023-11-11 DIAGNOSIS — Z9013 Acquired absence of bilateral breasts and nipples: Secondary | ICD-10-CM | POA: Insufficient documentation

## 2023-11-11 DIAGNOSIS — C50919 Malignant neoplasm of unspecified site of unspecified female breast: Secondary | ICD-10-CM

## 2023-11-11 DIAGNOSIS — Z87891 Personal history of nicotine dependence: Secondary | ICD-10-CM | POA: Insufficient documentation

## 2023-11-11 DIAGNOSIS — Z79899 Other long term (current) drug therapy: Secondary | ICD-10-CM | POA: Insufficient documentation

## 2023-11-11 DIAGNOSIS — C50911 Malignant neoplasm of unspecified site of right female breast: Secondary | ICD-10-CM | POA: Insufficient documentation

## 2023-11-11 DIAGNOSIS — Z170 Estrogen receptor positive status [ER+]: Secondary | ICD-10-CM | POA: Diagnosis not present

## 2023-11-11 DIAGNOSIS — C50912 Malignant neoplasm of unspecified site of left female breast: Secondary | ICD-10-CM | POA: Diagnosis not present

## 2023-11-11 DIAGNOSIS — Z79811 Long term (current) use of aromatase inhibitors: Secondary | ICD-10-CM | POA: Diagnosis not present

## 2023-11-11 DIAGNOSIS — R109 Unspecified abdominal pain: Secondary | ICD-10-CM | POA: Diagnosis not present

## 2023-11-11 LAB — CBC WITH DIFFERENTIAL/PLATELET
Abs Immature Granulocytes: 0.02 10*3/uL (ref 0.00–0.07)
Basophils Absolute: 0.1 10*3/uL (ref 0.0–0.1)
Basophils Relative: 2 %
Eosinophils Absolute: 0.1 10*3/uL (ref 0.0–0.5)
Eosinophils Relative: 3 %
HCT: 29.5 % — ABNORMAL LOW (ref 36.0–46.0)
Hemoglobin: 9.7 g/dL — ABNORMAL LOW (ref 12.0–15.0)
Immature Granulocytes: 0 %
Lymphocytes Relative: 27 %
Lymphs Abs: 1.3 10*3/uL (ref 0.7–4.0)
MCH: 35.3 pg — ABNORMAL HIGH (ref 26.0–34.0)
MCHC: 32.9 g/dL (ref 30.0–36.0)
MCV: 107.3 fL — ABNORMAL HIGH (ref 80.0–100.0)
Monocytes Absolute: 0.7 10*3/uL (ref 0.1–1.0)
Monocytes Relative: 15 %
Neutro Abs: 2.5 10*3/uL (ref 1.7–7.7)
Neutrophils Relative %: 53 %
Platelets: 191 10*3/uL (ref 150–400)
RBC: 2.75 MIL/uL — ABNORMAL LOW (ref 3.87–5.11)
RDW: 16.6 % — ABNORMAL HIGH (ref 11.5–15.5)
WBC: 4.7 10*3/uL (ref 4.0–10.5)
nRBC: 0 % (ref 0.0–0.2)

## 2023-11-11 LAB — COMPREHENSIVE METABOLIC PANEL
ALT: 22 U/L (ref 0–44)
AST: 29 U/L (ref 15–41)
Albumin: 4.1 g/dL (ref 3.5–5.0)
Alkaline Phosphatase: 76 U/L (ref 38–126)
Anion gap: 14 (ref 5–15)
BUN: 29 mg/dL — ABNORMAL HIGH (ref 8–23)
CO2: 25 mmol/L (ref 22–32)
Calcium: 9.8 mg/dL (ref 8.9–10.3)
Chloride: 101 mmol/L (ref 98–111)
Creatinine, Ser: 1.74 mg/dL — ABNORMAL HIGH (ref 0.44–1.00)
GFR, Estimated: 31 mL/min — ABNORMAL LOW (ref >60)
Glucose, Bld: 124 mg/dL — ABNORMAL HIGH (ref 70–99)
Potassium: 4.4 mmol/L (ref 3.5–5.1)
Sodium: 140 mmol/L (ref 135–145)
Total Bilirubin: 0.5 mg/dL (ref <1.2)
Total Protein: 7.6 g/dL (ref 6.5–8.1)

## 2023-11-11 MED ORDER — FULVESTRANT 250 MG/5ML IM SOSY
500.0000 mg | PREFILLED_SYRINGE | Freq: Once | INTRAMUSCULAR | Status: AC
Start: 2023-11-11 — End: 2023-11-11
  Administered 2023-11-11: 500 mg via INTRAMUSCULAR
  Filled 2023-11-11: qty 10

## 2023-11-11 NOTE — Progress Notes (Signed)
[facility]  [phone]  [phone]     Cassidy Norris DOB: 03-Sep-1951  MR#: [id number]  LOV#:[id number]  Patient Care Team: Smitty Cords, DO as PCP - General (Family Medicine) Iran Ouch, MD as PCP - Cardiology (Cardiology) Drema Dallas, DO as Consulting Physician (Neurology) Jeralyn Ruths, MD as Consulting Physician (Oncology) Benita Gutter, RN as Oncology Nurse Navigator Isla Pence, OD (Optometry)  CHIEF COMPLAINT: Recurrent, metastatic ER/PR, HER2 negative invasive carcinoma of the breast.    INTERVAL HISTORY: Patient returns to clinic today for further evaluation and continuation of Kisqali and fulvestrant.  She complains of right flank pain over the last 1 to 2 weeks, but otherwise feels well.  She is tolerating her treatments without significant side effects.  She does not complain of any weakness or fatigue today.  She denies any other pain.  She has no new neurologic complaints.  She denies any recent fevers or illnesses.  She has a fair appetite and denies weight loss.  She has no chest pain, shortness of breath, cough, or hemoptysis.  She denies any nausea, vomiting, constipation, or diarrhea.  She has no urinary complaints.  Patient offers no further specific complaints today.  REVIEW OF SYSTEMS:   Review of Systems  Constitutional: Negative.  Negative for fever, malaise/fatigue and weight loss.  Respiratory: Negative.  Negative for cough, hemoptysis and shortness of breath.   Cardiovascular: Negative.  Negative for chest pain and leg swelling.  Gastrointestinal: Negative.  Negative for abdominal pain.  Genitourinary: Negative.  Negative for dysuria.  Musculoskeletal: Negative.  Negative for joint pain and myalgias.  Skin: Negative.  Negative for rash.  Neurological: Negative.  Negative for dizziness, sensory change, focal weakness, weakness and headaches.  Psychiatric/Behavioral: Negative.  The patient  is not nervous/anxious.     As per HPI. Otherwise, a complete review of systems is negative.  ONCOLOGY HISTORY: Oncology History Overview Note  1. Bilateral carcinoma of breast, February, 2015. Right breast status post radical mastectomy. T1 C. N0M0 (isolated tumor cells in one lymph node) multifocal invasive cancer. Stage IC, Left breast, Status post radical mastectomy, T2, N1, M0 tumor. Stage II, RIght breast. Both tumors are estrogen receptor positive.  Progesterone receptor positive.  HER-2 receptor negative by FISH. Negative for BRCA mutation.  2. Started on Adriamycin and Cytoxan on March 01, 2014. Last cycle of Cytoxan and Adriamycin on May 01, 2014. Patient has finished last chemotherapy on September 9. (12 th dose was omitted because of neuropathy) 3.  Taking tamoxifen.  Patient had a poor tolerance to letrozole.(October, 2015) 4.  Patient is taking letrozole since November of 2015 5.  May, 2016 Letrozole has been put on hold because of bony pains 6.  Started on Aromasin from July of 2016    History of bilateral breast cancer (Resolved)  11/29/2013 Initial Diagnosis   Breast cancer bilateral, multifocal ER/PR pos, Her 2 neg,.     PAST MEDICAL HISTORY: Past Medical History:  Diagnosis Date   Anemia    Arthritis not really diagnosis but have pain   B12 deficiency 07/23/2016   Will check levels to determine if injections are needed again. Await results. Recent CBC at Onc WNL   Blood transfusion without reported diagnosis 1983 had a miscarriage/dnc   Cancer (HCC) 12/30/2013   Multifocal disease: pT1c,m, N0(isolated tumor cells) Er/ PR positive, Her 2 negative.   Cancer (HCC) 01/09/2014   Invasive lobular carcinoma, 2.2 cm, T2,N1 ER/PR positive,  HER-2/neu negative   Cataract 2014?   Centrilobular emphysema (HCC) 10/27/2021   CKD (chronic kidney disease), stage IV (HCC) 07/13/2023   Complication of anesthesia 03/06/2022   Prolonged metabolism of Rocuronium during case.    Diabetes mellitus without complication (HCC)    Diffuse cystic mastopathy    Erosive esophagitis    Essential hypertension, benign 09/24/2017   Family history of malignant neoplasm of gastrointestinal tract 2012   Fractured elbow 08/28/2014   Gastroesophageal reflux disease    Hx of metabolic acidosis with increased anion gap    Increased heart rate    Multiple sclerosis (HCC)    Neuromuscular disorder (HCC)    neuropathy in bil feet - left is worse.   Obesity, unspecified    Peripheral neuropathy    Personal history of tobacco use, presenting hazards to health    Restless leg syndrome    Sleep apnea    uses CPAP   Special screening for malignant neoplasms, colon    Squamous cell carcinoma of skin 08/24/2022   SCCIS - scalp, ED&C   Squamous cell carcinoma of skin 07/20/2023   right of midline ant scalp post, tx with ED&C   Squamous cell carcinoma of skin 07/20/2023   right of midline ant scalp ant, tx with ED&C    PAST SURGICAL HISTORY: Past Surgical History:  Procedure Laterality Date   BREAST BIOPSY Right 1973,2001   BREAST BIOPSY Right 11/07/2013   INVASIVE MAMMARY CARCINOMA , ER/PR positive Her2 negative   BREAST BIOPSY Left 11/27/2013   invasive lobular and DCIS   BREAST SURGERY Bilateral 01/09/2014   mastectomy   cataract surgery Left 08/13/2022   CHOLECYSTECTOMY     COLONOSCOPY  2010   Dr. Evette Cristal   COLONOSCOPY WITH PROPOFOL N/A 06/11/2015   Procedure: COLONOSCOPY WITH PROPOFOL;  Surgeon: Kieth Brightly, MD;  Location: [facility] ENDOSCOPY;  Service: Endoscopy;  Laterality: N/A;   COLONOSCOPY WITH PROPOFOL N/A 12/23/2021   Procedure: COLONOSCOPY WITH PROPOFOL;  Surgeon: Toney Reil, MD;  Location: [facility] ENDOSCOPY;  Service: Gastroenterology;  Laterality: N/A;   DILATATION & CURETTAGE/HYSTEROSCOPY WITH MYOSURE N/A 07/12/2018   Procedure: DILATATION & CURETTAGE/HYSTEROSCOPY WITH MYOSURE WITH ENDOMETRIAL POLYPECTOMY;  Surgeon: Conard Novak, MD;   Location: [facility] ORS;  Service: Gynecology;  Laterality: N/A;   DILATION AND CURETTAGE OF UTERUS  1983   ESOPHAGOGASTRODUODENOSCOPY N/A 12/23/2021   Procedure: ESOPHAGOGASTRODUODENOSCOPY (EGD);  Surgeon: Toney Reil, MD;  Location: [facility] ENDOSCOPY;  Service: Gastroenterology;  Laterality: N/A;   JOINT REPLACEMENT Left 11/23/2015   right  2023   KNEE ARTHROPLASTY Right 03/06/2022   Procedure: COMPUTER ASSISTED TOTAL KNEE ARTHROPLASTY;  Surgeon: Donato Heinz, MD;  Location: [facility] ORS;  Service: Orthopedics;  Laterality: Right;   LAPAROSCOPIC BILATERAL SALPINGO OOPHERECTOMY N/A 08/04/2023   Procedure: LAPAROSCOPIC BILATERAL SALPINGO OOPHORECTOMY;  Surgeon: Leida Lauth, MD;  Location: [facility] ORS;  Service: Gynecology;  Laterality: N/A;   POLYPECTOMY  1989   PORTA CATH REMOVAL     PORTACATH PLACEMENT  2015   SKIN BIOPSY     on scalp   SPINE SURGERY  09/14/2014   Ruptured disk L4- L5   TOTAL KNEE ARTHROPLASTY Left 11/22/2015   Procedure: TOTAL KNEE ARTHROPLASTY;  Surgeon: Frederico Hamman, MD;  Location: [facility];  Service: Orthopedics;  Laterality: Left;   TUBAL LIGATION     WISDOM TOOTH EXTRACTION  2006    FAMILY HISTORY Family History  Problem Relation Age of Onset   Lung cancer Mother 67  COPD Mother    Colon cancer Father 64   Stomach cancer Father 69   Hypertension Sister    Breast cancer Maternal Aunt        dx over 17   Breast cancer Paternal Aunt        dx 53s   Breast cancer Paternal Aunt        dx 65s   Rectal cancer Paternal Uncle        dx 32s   Rectal cancer Paternal Uncle        dx 47s   Diabetes Maternal Grandmother    Pancreatic cancer Paternal Grandfather    Rectal cancer Cousin        dx 80s-60s   Rectal cancer Cousin        dx >50    GYNECOLOGIC HISTORY:  Patient's last menstrual period was 12/22/1999 (approximate).     ADVANCED DIRECTIVES:    HEALTH MAINTENANCE: Social History   Tobacco Use   Smoking status: Former    Current packs/day:  0.00    Average packs/day: 1 pack/day for 30.0 years (30.0 ttl pk-yrs)    Types: Cigarettes    Start date: 12/12/1983    Quit date: 12/11/2013    Years since quitting: 9.9   Smokeless tobacco: Former  Building services engineer status: Never Used  Substance Use Topics   Alcohol use: Not Currently   Drug use: No     Colonoscopy:  PAP:  Bone density:  Mammogram:  No Known Allergies  Current Outpatient Medications  Medication Sig Dispense Refill   acetaminophen (TYLENOL) 500 MG tablet Take 1,000 mg by mouth 2 (two) times daily as needed for moderate pain or headache.     baclofen (LIORESAL) 10 MG tablet Take 1 tablet (10 mg total) by mouth 3 (three) times daily as needed for muscle spasms. 1080 tablet 0   calcium carbonate (OSCAL) 1500 (600 Ca) MG TABS tablet Take by mouth 2 (two) times daily with a meal.     dapagliflozin propanediol (FARXIGA) 10 MG TABS tablet Take 10 mg by mouth daily.     diclofenac Sodium (VOLTAREN) 1 % GEL Apply 2 g topically 3 (three) times daily as needed. 100 g 2   fluticasone (FLONASE) 50 MCG/ACT nasal spray PLACE 2 SPRAYS INTO BOTH NOSTRILS AT BEDTIME. 48 g 1   fulvestrant (FASLODEX) 250 MG/5ML injection Inject 500 mg into the muscle every 30 (thirty) days. One injection each buttock over 1-2 minutes. Warm prior to use.     Krill Oil 1000 MG CAPS Take 1,000 mg by mouth daily.     lisinopril (ZESTRIL) 5 MG tablet Take 1 tablet (5 mg total) by mouth daily. 90 tablet 3   loperamide (IMODIUM A-D) 2 MG tablet Take 2 tablets (4 mg total) by mouth 4 (four) times daily as needed for diarrhea or loose stools. 30 tablet 0   metFORMIN (GLUCOPHAGE) 500 MG tablet Take 1 tablet (500 mg total) by mouth 2 (two) times daily with a meal. 180 tablet 3   omeprazole (PRILOSEC) 40 MG capsule TAKE 1 CAPSULE BY MOUTH TWICE A DAY 180 capsule 1   ondansetron (ZOFRAN) 8 MG tablet Take 1 tablet (8 mg total) by mouth every 8 (eight) hours as needed for nausea or vomiting. 20 tablet 0    prochlorperazine (COMPAZINE) 10 MG tablet Take 1 tablet (10 mg total) by mouth every 6 (six) hours as needed for nausea or vomiting. 20 tablet 1   ribociclib succ (  KISQALI, 600 MG DOSE,) 200 MG Therapy Pack Take 3 tablets (600 mg total) by mouth daily. Take for 21 days on, 7 days off, repeat every 28 days. 63 tablet 5   sodium chloride (OCEAN) 0.65 % SOLN nasal spray Place 1 spray into both nostrils as needed for congestion.     traZODone (DESYREL) 50 MG tablet TAKE 1 TABLET (50 MG TOTAL) BY MOUTH AT BEDTIME AS NEEDED. FOR SLEEP 90 tablet 0   venlafaxine XR (EFFEXOR-XR) 75 MG 24 hr capsule Take 1 capsule (75 mg total) by mouth daily with breakfast. 360 capsule 0   vitamin B-12 (CYANOCOBALAMIN) 1000 MCG tablet Take 2,500 mcg by mouth every Monday, Wednesday, and Friday.     Vitamin D, Cholecalciferol, 50 MCG (2000 UT) CAPS Take 2,000 Units by mouth every Monday, Wednesday, and Friday.     albuterol (VENTOLIN HFA) 108 (90 Base) MCG/ACT inhaler Inhale 2 puffs into the lungs every 4 (four) hours as needed for wheezing or shortness of breath. (Patient not taking: Reported on 11/11/2023) 8 g 2   No current facility-administered medications for this visit.    OBJECTIVE: BP 119/62 (BP Location: Left Arm, Patient Position: Sitting, Cuff Size: Normal)   Pulse 63   Temp (!) 97.2 F (36.2 C) (Tympanic)   Resp 16   Ht 5\' 5"  (1.651 m)   Wt 189 lb (85.7 kg)   LMP 12/22/1999 (Approximate)   SpO2 97%   BMI 31.45 kg/m    Body mass index is 31.45 kg/m.    ECOG FS:0 - Asymptomatic  General: Well-developed, well-nourished, no acute distress. Eyes: Pink conjunctiva, anicteric sclera. HEENT: Normocephalic, moist mucous membranes. Lungs: No audible wheezing or coughing. Heart: Regular rate and rhythm. Abdomen: Soft, nontender, no obvious distention. Musculoskeletal: No edema, cyanosis, or clubbing. Neuro: Alert, answering all questions appropriately. Cranial nerves grossly intact. Skin: No rashes or  petechiae noted. Psych: Normal affect.  LAB RESULTS:  Appointment on 11/11/2023  Component Date Value Ref Range Status   WBC 11/11/2023 4.7  4.0 - 10.5 K/uL Final   RBC 11/11/2023 2.75 (L)  3.87 - 5.11 MIL/uL Final   Hemoglobin 11/11/2023 9.7 (L)  12.0 - 15.0 g/dL Final   HCT 16/08/9603 29.5 (L)  36.0 - 46.0 % Final   MCV 11/11/2023 107.3 (H)  80.0 - 100.0 fL Final   MCH 11/11/2023 35.3 (H)  26.0 - 34.0 pg Final   MCHC 11/11/2023 32.9  30.0 - 36.0 g/dL Final   RDW 54/07/8118 16.6 (H)  11.5 - 15.5 % Final   Platelets 11/11/2023 191  150 - 400 K/uL Final   nRBC 11/11/2023 0.0  0.0 - 0.2 % Final   Neutrophils Relative % 11/11/2023 53  % Final   Neutro Abs 11/11/2023 2.5  1.7 - 7.7 K/uL Final   Lymphocytes Relative 11/11/2023 27  % Final   Lymphs Abs 11/11/2023 1.3  0.7 - 4.0 K/uL Final   Monocytes Relative 11/11/2023 15  % Final   Monocytes Absolute 11/11/2023 0.7  0.1 - 1.0 K/uL Final   Eosinophils Relative 11/11/2023 3  % Final   Eosinophils Absolute 11/11/2023 0.1  0.0 - 0.5 K/uL Final   Basophils Relative 11/11/2023 2  % Final   Basophils Absolute 11/11/2023 0.1  0.0 - 0.1 K/uL Final   Immature Granulocytes 11/11/2023 0  % Final   Abs Immature Granulocytes 11/11/2023 0.02  0.00 - 0.07 K/uL Final   Performed at [facility], [address], [place], [place] [address]  Sodium 11/11/2023 140  135 - 145 mmol/L Final   Potassium 11/11/2023 4.4  3.5 - 5.1 mmol/L Final   Chloride 11/11/2023 101  98 - 111 mmol/L Final   CO2 11/11/2023 25  22 - 32 mmol/L Final   Glucose, Bld 11/11/2023 124 (H)  70 - 99 mg/dL Final   Glucose reference range applies only to samples taken after fasting for at least 8 hours.   BUN 11/11/2023 29 (H)  8 - 23 mg/dL Final   Creatinine, Ser 11/11/2023 1.74 (H)  0.44 - 1.00 mg/dL Final   Calcium 14/78/2956 9.8  8.9 - 10.3 mg/dL Final   Total Protein 21/30/8657 7.6  6.5 - 8.1 g/dL Final   Albumin 84/69/6295 4.1  3.5 - 5.0 g/dL Final   AST 28/41/3244 29   15 - 41 U/L Final   ALT 11/11/2023 22  0 - 44 U/L Final   Alkaline Phosphatase 11/11/2023 76  38 - 126 U/L Final   Total Bilirubin 11/11/2023 0.5  <1.2 mg/dL Final   GFR, Estimated 11/11/2023 31 (L)  >60 mL/min Final   Comment: (NOTE) Calculated using the CKD-EPI Creatinine Equation (2021)    Anion gap 11/11/2023 14  5 - 15 Final   Performed at [facility], [address] [place], [place] [address]    STUDIES: No results found.  ONCOLOGY HISTORY:  Bilateral adenocarcinoma of the breast. Patient is status post bilateral mastectomy in February 2015. She also received adjuvant chemotherapy with Adriamycin, Cytoxan, and Taxol completing treatment in approximately October 2015. She then initiated letrozole, but could not tolerate secondary to leg pain and was switched to Aromasin.  Patient then switched to tamoxifen in October 2019 secondary to cost.  Patient subsequently discontinued tamoxifen and did not complete the recommended 5 years of treatment.   ASSESSMENT: Recurrent, metastatic ER/PR, HER2 negative invasive carcinoma of the breast.   PLAN:    Recurrent, metastatic ER/PR, HER2 negative invasive carcinoma of the breast: See oncology history as above.  Biopsy confirmed recurrent disease.  PET scan results from December 26, 2021 reviewed independently with hypermetabolic left supraclavicular, subpectoral, and axillary lymphadenopathy consistent with nodal recurrence of patient's history of breast cancer.  No distant metastases were identified.  Her most recent PET scan on May 26, 2023 reviewed independently with improved disease burden. Her initial CA 27-29 initially was 260.1.  Now seems to have plateaued between 78.8 and 98.5.  Her most result is 78.8.  Today's result is pending. Of note, patient also had metastatic breast cancer in her surgical specimen from her oophorectomy.  Continue Kisqali as prescribed.  Proceed with fulvestrant today.  Will get PET scan in 3 weeks to assess  treatment response.  Patient would then return to clinic in 4 weeks for repeat laboratory work, further evaluation, and continuation of treatment.  Appreciate clinical pharmacy input.   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  Follow-up with gynecology oncology as scheduled. Osteopenia: Patient's last bone mineral density was on August 01, 2016 and revealed a T-score of -1.2. Continue monitoring bone mineral density by PCP. Multiple sclerosis: Chronic and unchanged.  Continue evaluation and treatment per neurology.  Recent MRI of the brain did not reveal any new lesions. Renal insufficiency: GFR slightly decreased at 31.  Kisqali may need to be dose reduced if GFR remains persistently less than 30. Anemia: Chronic and unchanged.  Patient's hemoglobin is 9.7 today.  Monitor. Reflux: Continue omeprazole.    Patient expressed understanding and  was in agreement with this plan. She also understands that She can call clinic at any time with any questions, concerns, or complaints.   Breast cancer bilateral, multifocal ER/PR pos, Her 2 neg.   Staging form: Breast, AJCC 7th Edition     Clinical: Stage IIB (T2, N1, M0) - Signed by Johney Maine, MD on 04/22/2015   Jeralyn Ruths, MD   11/11/2023 10:48 AM

## 2023-11-11 NOTE — Progress Notes (Signed)
Right side pain. Noticed it 2 weeks ago. States that the pain is pretty consistent and hurts worse when she bends over. PCP states that triglycerides are elevated and wondered if it was treatment related.

## 2023-11-12 ENCOUNTER — Other Ambulatory Visit: Payer: Self-pay | Admitting: *Deleted

## 2023-11-12 LAB — CANCER ANTIGEN 27.29: CA 27.29: 81.3 U/mL — ABNORMAL HIGH (ref 0.0–38.6)

## 2023-11-12 MED ORDER — METOPROLOL TARTRATE 25 MG PO TABS: 12.5000 mg | ORAL_TABLET | Freq: Two times a day (BID) | ORAL | 1 refills | Status: DC

## 2023-11-12 NOTE — Telephone Encounter (Signed)
Spoke to patient scheduled lab appointment.   Verbal understanding.

## 2023-11-15 ENCOUNTER — Ambulatory Visit: Payer: Medicare Other | Attending: Cardiovascular Disease

## 2023-11-15 DIAGNOSIS — R0602 Shortness of breath: Secondary | ICD-10-CM | POA: Diagnosis not present

## 2023-11-15 LAB — ECHOCARDIOGRAM COMPLETE
AV Mean grad: 9 mm[Hg]
AV Peak grad: 15.8 mm[Hg]
Ao pk vel: 1.99 m/s
Area-P 1/2: 3.91 cm2
S' Lateral: 3.5 cm

## 2023-11-18 ENCOUNTER — Encounter: Payer: Self-pay | Admitting: Oncology

## 2023-11-25 ENCOUNTER — Other Ambulatory Visit: Payer: Self-pay | Admitting: Pharmacist

## 2023-11-25 ENCOUNTER — Other Ambulatory Visit: Payer: Self-pay

## 2023-11-25 ENCOUNTER — Encounter: Payer: Self-pay | Admitting: Oncology

## 2023-11-25 ENCOUNTER — Other Ambulatory Visit (HOSPITAL_COMMUNITY): Payer: Self-pay

## 2023-11-25 DIAGNOSIS — C50919 Malignant neoplasm of unspecified site of unspecified female breast: Secondary | ICD-10-CM

## 2023-11-25 MED ORDER — RIBOCICLIB SUCC (600 MG DOSE) 200 MG PO TBPK
600.0000 mg | ORAL_TABLET | Freq: Every day | ORAL | 5 refills | Status: DC
  Filled 2023-11-25: qty 63, 28d supply, fill #0
  Filled 2023-11-29 – 2023-12-22 (×2): qty 63, 21d supply, fill #0

## 2023-11-25 NOTE — Progress Notes (Signed)
 Specialty Pharmacy Initial Fill Coordination Note  Cassidy Norris is a 73 y.o. female contacted today regarding initial fill of specialty medication(s) Ribociclib  Succinate (KISQALI  600mg  Daily Dose)  Patient requested Delivery   Delivery date: 11/29/23   Verified address: [address] [place], [place] [address]  Medication will be filled on 11/26/23.   Patient is aware of $0.00 copayment. Bill [employer].    Morene Potters, CPhT Oncology Pharmacy Patient Advocate  [facility]  [phone] (phone) [phone] (fax) 11/25/2023 9:53 AM

## 2023-11-25 NOTE — Telephone Encounter (Signed)
 Patient successfully OnBoarded. Medication scheduled to be shipped on Friday, 11/26/23, for delivery on Monday, 11/29/23, from [facility] to patient's address. Patient also knows to call me at [phone] with any questions or concerns regarding receiving medication or if there is any unexpected change in co-pay.    Morene Potters, CPhT Oncology Pharmacy Patient Advocate  [facility]  [phone] (phone) [phone] (fax) 11/25/2023 9:56 AM

## 2023-11-25 NOTE — Progress Notes (Signed)
 Patient switching to filling ribociclib at [facility] (Specialty).

## 2023-11-25 NOTE — Progress Notes (Signed)
 Specialty Pharmacy Initiation Note   Cassidy Norris is a 73 y.o. female who will be followed by the specialty pharmacy service for RxSp Oncology    Review of administration, indication, effectiveness, safety, potential side effects, storage/disposable, and missed dose instructions occurred today for patient's specialty medication(s) Ribociclib  Succinate (KISQALI  600mg  Daily Dose)     Patient/Caregiver did not have any additional questions or concerns.   Patient's therapy is appropriate to: Initiate    Goals Addressed             This Visit's Progress    Slow Disease Progression       Patient is initiating therapy. Patient will maintain adherence         Taavi Hoose N Tashya Alberty Specialty Pharmacist

## 2023-11-26 ENCOUNTER — Other Ambulatory Visit: Payer: Self-pay

## 2023-11-26 ENCOUNTER — Encounter: Payer: Self-pay | Admitting: Family Medicine

## 2023-11-26 DIAGNOSIS — E119 Type 2 diabetes mellitus without complications: Secondary | ICD-10-CM | POA: Diagnosis not present

## 2023-11-26 DIAGNOSIS — H2513 Age-related nuclear cataract, bilateral: Secondary | ICD-10-CM | POA: Diagnosis not present

## 2023-11-26 DIAGNOSIS — H1045 Other chronic allergic conjunctivitis: Secondary | ICD-10-CM | POA: Diagnosis not present

## 2023-11-26 DIAGNOSIS — H5203 Hypermetropia, bilateral: Secondary | ICD-10-CM | POA: Diagnosis not present

## 2023-11-26 LAB — HM DIABETES EYE EXAM

## 2023-11-26 NOTE — Progress Notes (Signed)
 11/26/23: Kisqali  Medication has been ordered and will be shipped on 11/29/23. The patient will receive supply on 11/30/23. Left patient a voicemail.

## 2023-11-29 ENCOUNTER — Other Ambulatory Visit (HOSPITAL_COMMUNITY): Payer: Self-pay

## 2023-11-29 ENCOUNTER — Other Ambulatory Visit: Payer: Self-pay

## 2023-11-29 ENCOUNTER — Other Ambulatory Visit: Payer: Self-pay | Admitting: Family Medicine

## 2023-11-29 ENCOUNTER — Other Ambulatory Visit: Payer: Self-pay | Admitting: Pharmacist

## 2023-11-29 DIAGNOSIS — C50919 Malignant neoplasm of unspecified site of unspecified female breast: Secondary | ICD-10-CM

## 2023-11-29 DIAGNOSIS — E781 Pure hyperglyceridemia: Secondary | ICD-10-CM | POA: Diagnosis not present

## 2023-11-29 DIAGNOSIS — E1169 Type 2 diabetes mellitus with other specified complication: Secondary | ICD-10-CM

## 2023-11-29 DIAGNOSIS — E785 Hyperlipidemia, unspecified: Secondary | ICD-10-CM | POA: Diagnosis not present

## 2023-11-29 MED ORDER — RIBOCICLIB SUCC (200 MG DOSE) 200 MG PO TBPK
200.0000 mg | ORAL_TABLET | Freq: Every day | ORAL | 0 refills | Status: DC
  Filled 2023-11-29: qty 63, 63d supply, fill #0

## 2023-11-29 MED ORDER — RIBOCICLIB SUCC (200 MG DOSE) 200 MG PO TBPK
600.0000 mg | ORAL_TABLET | Freq: Every day | ORAL | 0 refills | Status: DC
  Filled 2023-11-29: qty 63, 21d supply, fill #0

## 2023-11-29 NOTE — Progress Notes (Signed)
 Kisqali  600mg  dose pack unavailable with no release date. Ordered 3 of the 200mg  dose packs instead and counseled patient on change and delay in shipping. Medication should be in on 1/7, just confirming $2000 copay can be billed to [facility] with [facility] before proceeding.

## 2023-11-29 NOTE — Addendum Note (Signed)
 Addended by: Remi Haggard on: 11/29/2023 04:41 PM   Modules accepted: Orders

## 2023-11-30 ENCOUNTER — Other Ambulatory Visit: Payer: Self-pay

## 2023-11-30 LAB — TRIGLYCERIDES: Triglycerides: 514 mg/dL — ABNORMAL HIGH (ref <150)

## 2023-11-30 LAB — LDL CHOLESTEROL, DIRECT: Direct LDL: 133 mg/dL — ABNORMAL HIGH (ref <100)

## 2023-11-30 NOTE — Progress Notes (Signed)
 Kisqali did not make it out in delivery. Per conversation with patient yesterday she still has some on hand. Will ship 1/8, lvm for patient to advise.

## 2023-12-01 ENCOUNTER — Encounter: Payer: Self-pay | Admitting: Family Medicine

## 2023-12-01 ENCOUNTER — Ambulatory Visit
Admission: RE | Admit: 2023-12-01 | Discharge: 2023-12-01 | Disposition: A | Discharge status: Home / Self Care | Payer: Medicare Other | Source: Ambulatory Visit | Attending: Oncology | Admitting: Oncology

## 2023-12-01 DIAGNOSIS — E1169 Type 2 diabetes mellitus with other specified complication: Secondary | ICD-10-CM

## 2023-12-01 DIAGNOSIS — C50919 Malignant neoplasm of unspecified site of unspecified female breast: Secondary | ICD-10-CM | POA: Insufficient documentation

## 2023-12-01 DIAGNOSIS — I700 Atherosclerosis of aorta: Secondary | ICD-10-CM | POA: Diagnosis not present

## 2023-12-01 DIAGNOSIS — E785 Hyperlipidemia, unspecified: Secondary | ICD-10-CM

## 2023-12-01 LAB — GLUCOSE, CAPILLARY: Glucose-Capillary: 102 mg/dL — ABNORMAL HIGH (ref 70–99)

## 2023-12-01 MED ORDER — FLUDEOXYGLUCOSE F - 18 (FDG) INJECTION
9.8000 | Freq: Once | INTRAVENOUS | Status: AC | PRN
  Administered 2023-12-01: 10.33 via INTRAVENOUS

## 2023-12-04 ENCOUNTER — Other Ambulatory Visit: Payer: Self-pay | Admitting: Family Medicine

## 2023-12-04 DIAGNOSIS — G4700 Insomnia, unspecified: Secondary | ICD-10-CM

## 2023-12-05 ENCOUNTER — Other Ambulatory Visit: Payer: Self-pay | Admitting: Cardiovascular Disease

## 2023-12-06 MED ORDER — EZETIMIBE 10 MG PO TABS: 10.0000 mg | ORAL_TABLET | Freq: Every day | ORAL | 3 refills | Status: DC

## 2023-12-06 NOTE — Addendum Note (Signed)
 Addended by: Smitty Cords on: 12/06/2023 06:56 PM   Modules accepted: Orders

## 2023-12-07 NOTE — Telephone Encounter (Signed)
 Requested Prescriptions  Pending Prescriptions Disp Refills   traZODone  (DESYREL ) 50 MG tablet [Pharmacy Med Name: TRAZODONE  50 MG TABLET] 90 tablet 1    Sig: TAKE 1 TABLET (50 MG TOTAL) BY MOUTH AT BEDTIME AS NEEDED. FOR SLEEP     Psychiatry: Antidepressants - Serotonin Modulator Passed - 12/07/2023  9:20 AM      Passed - Valid encounter within last 6 months    Recent Outpatient Visits           1 month ago Type 2 diabetes mellitus with other specified complication, without long-term current use of insulin  [facility])   [facility] [place], Marsa PARAS, DO   2 months ago Acute cough   [place] [facility] [place] Marsa PARAS, DO   4 months ago CKD (chronic kidney disease), stage IV [facility])   [place] [facility] [place] Marsa PARAS, DO   7 months ago Essential hypertension, benign   [place] [facility] [place] Marsa PARAS, DO   1 year ago Essential hypertension, benign   [place] [facility] [place] Marsa PARAS, DO       Future Appointments             In 1 month Darron, Deatrice LABOR, MD [facility] [place]   In 2 months Hester Alm BROCKS, MD [facility]

## 2023-12-10 ENCOUNTER — Inpatient Hospital Stay: Payer: Medicare Other | Attending: Oncology

## 2023-12-10 ENCOUNTER — Inpatient Hospital Stay (HOSPITAL_BASED_OUTPATIENT_CLINIC_OR_DEPARTMENT_OTHER): Payer: Medicare Other | Admitting: Oncology

## 2023-12-10 ENCOUNTER — Encounter: Payer: Self-pay | Admitting: Oncology

## 2023-12-10 ENCOUNTER — Inpatient Hospital Stay: Payer: Medicare Other

## 2023-12-10 VITALS — BP 118/70 | HR 69 | Temp 97.8°F | Resp 16 | Wt 193.0 lb

## 2023-12-10 DIAGNOSIS — C50912 Malignant neoplasm of unspecified site of left female breast: Secondary | ICD-10-CM | POA: Diagnosis not present

## 2023-12-10 DIAGNOSIS — C50911 Malignant neoplasm of unspecified site of right female breast: Secondary | ICD-10-CM | POA: Insufficient documentation

## 2023-12-10 DIAGNOSIS — M8580 Other specified disorders of bone density and structure, unspecified site: Secondary | ICD-10-CM

## 2023-12-10 DIAGNOSIS — C50919 Malignant neoplasm of unspecified site of unspecified female breast: Secondary | ICD-10-CM

## 2023-12-10 DIAGNOSIS — E669 Obesity, unspecified: Secondary | ICD-10-CM | POA: Diagnosis not present

## 2023-12-10 DIAGNOSIS — I129 Hypertensive chronic kidney disease with stage 1 through stage 4 chronic kidney disease, or unspecified chronic kidney disease: Secondary | ICD-10-CM

## 2023-12-10 DIAGNOSIS — D649 Anemia, unspecified: Secondary | ICD-10-CM | POA: Diagnosis not present

## 2023-12-10 DIAGNOSIS — Z79811 Long term (current) use of aromatase inhibitors: Secondary | ICD-10-CM | POA: Diagnosis not present

## 2023-12-10 DIAGNOSIS — Z6832 Body mass index (BMI) 32.0-32.9, adult: Secondary | ICD-10-CM | POA: Diagnosis not present

## 2023-12-10 DIAGNOSIS — E1122 Type 2 diabetes mellitus with diabetic chronic kidney disease: Secondary | ICD-10-CM | POA: Insufficient documentation

## 2023-12-10 DIAGNOSIS — G2581 Restless legs syndrome: Secondary | ICD-10-CM | POA: Diagnosis not present

## 2023-12-10 DIAGNOSIS — Z79899 Other long term (current) drug therapy: Secondary | ICD-10-CM | POA: Insufficient documentation

## 2023-12-10 DIAGNOSIS — G35 Multiple sclerosis: Secondary | ICD-10-CM | POA: Diagnosis not present

## 2023-12-10 DIAGNOSIS — Z9013 Acquired absence of bilateral breasts and nipples: Secondary | ICD-10-CM | POA: Insufficient documentation

## 2023-12-10 DIAGNOSIS — Z7984 Long term (current) use of oral hypoglycemic drugs: Secondary | ICD-10-CM | POA: Diagnosis not present

## 2023-12-10 DIAGNOSIS — K219 Gastro-esophageal reflux disease without esophagitis: Secondary | ICD-10-CM | POA: Insufficient documentation

## 2023-12-10 DIAGNOSIS — Z170 Estrogen receptor positive status [ER+]: Secondary | ICD-10-CM | POA: Diagnosis not present

## 2023-12-10 DIAGNOSIS — N184 Chronic kidney disease, stage 4 (severe): Secondary | ICD-10-CM | POA: Diagnosis not present

## 2023-12-10 DIAGNOSIS — G4730 Sleep apnea, unspecified: Secondary | ICD-10-CM | POA: Diagnosis not present

## 2023-12-10 LAB — COMPREHENSIVE METABOLIC PANEL
ALT: 18 U/L (ref 0–44)
AST: 27 U/L (ref 15–41)
Albumin: 3.9 g/dL (ref 3.5–5.0)
Alkaline Phosphatase: 77 U/L (ref 38–126)
Anion gap: 13 (ref 5–15)
BUN: 36 mg/dL — ABNORMAL HIGH (ref 8–23)
CO2: 23 mmol/L (ref 22–32)
Calcium: 9 mg/dL (ref 8.9–10.3)
Chloride: 104 mmol/L (ref 98–111)
Creatinine, Ser: 1.82 mg/dL — ABNORMAL HIGH (ref 0.44–1.00)
GFR, Estimated: 29 mL/min — ABNORMAL LOW (ref >60)
Glucose, Bld: 119 mg/dL — ABNORMAL HIGH (ref 70–99)
Potassium: 4.5 mmol/L (ref 3.5–5.1)
Sodium: 140 mmol/L (ref 135–145)
Total Bilirubin: 0.4 mg/dL (ref 0.0–1.2)
Total Protein: 7.2 g/dL (ref 6.5–8.1)

## 2023-12-10 LAB — CBC WITH DIFFERENTIAL/PLATELET
Abs Immature Granulocytes: 0.02 10*3/uL (ref 0.00–0.07)
Basophils Absolute: 0.1 10*3/uL (ref 0.0–0.1)
Basophils Relative: 2 %
Eosinophils Absolute: 0.2 10*3/uL (ref 0.0–0.5)
Eosinophils Relative: 4 %
HCT: 28.7 % — ABNORMAL LOW (ref 36.0–46.0)
Hemoglobin: 9.4 g/dL — ABNORMAL LOW (ref 12.0–15.0)
Immature Granulocytes: 1 %
Lymphocytes Relative: 28 %
Lymphs Abs: 1.2 10*3/uL (ref 0.7–4.0)
MCH: 35.9 pg — ABNORMAL HIGH (ref 26.0–34.0)
MCHC: 32.8 g/dL (ref 30.0–36.0)
MCV: 109.5 fL — ABNORMAL HIGH (ref 80.0–100.0)
Monocytes Absolute: 0.8 10*3/uL (ref 0.1–1.0)
Monocytes Relative: 17 %
Neutro Abs: 2.2 10*3/uL (ref 1.7–7.7)
Neutrophils Relative %: 48 %
Platelets: 189 10*3/uL (ref 150–400)
RBC: 2.62 MIL/uL — ABNORMAL LOW (ref 3.87–5.11)
RDW: 15.1 % (ref 11.5–15.5)
WBC: 4.4 10*3/uL (ref 4.0–10.5)
nRBC: 0 % (ref 0.0–0.2)

## 2023-12-10 MED ORDER — FULVESTRANT 250 MG/5ML IM SOSY
500.0000 mg | PREFILLED_SYRINGE | Freq: Once | INTRAMUSCULAR | Status: AC
  Administered 2023-12-10: 500 mg via INTRAMUSCULAR
  Filled 2023-12-10: qty 10

## 2023-12-10 NOTE — Progress Notes (Signed)
Her PET scan from 1/8/225, it isn't back yet. She is still having the radiating pain from right side of back to the shoulder blade up under her arm. Ongoing for about a week, she has been feeling like there is something squeezing/wrapped around her sternum under both breast.

## 2023-12-10 NOTE — Progress Notes (Signed)
[facility]  [phone]  [phone]     Cassidy Norris DOB: 09-Oct-1951  MR#: [id number]  AOZ#:[id number]  Patient Care Team: Smitty Cords, DO as PCP - General (Family Medicine) Iran Ouch, MD as PCP - Cardiology (Cardiology) Drema Dallas, DO as Consulting Physician (Neurology) Jeralyn Ruths, MD as Consulting Physician (Oncology) Benita Gutter, RN as Oncology Nurse Navigator Isla Pence, OD (Optometry)  CHIEF COMPLAINT: Recurrent, metastatic ER/PR, HER2 negative invasive carcinoma of the breast.    INTERVAL HISTORY: Patient returns to clinic today for further evaluation and continuation of Kisqali and fulvestrant.  She continues to have positional right flank pain, but states this is moderately improved.  She continues to tolerate her treatments well without significant side effects.  She does not complain of any weakness or fatigue today.  She denies any other pain.  She has no new neurologic complaints.  She denies any recent fevers or illnesses.  She has a fair appetite and denies weight loss.  She has no chest pain, shortness of breath, cough, or hemoptysis.  She denies any nausea, vomiting, constipation, or diarrhea.  She has no urinary complaints.  Patient offers no further specific complaints today.  REVIEW OF SYSTEMS:   Review of Systems  Constitutional: Negative.  Negative for fever, malaise/fatigue and weight loss.  Respiratory: Negative.  Negative for cough, hemoptysis and shortness of breath.   Cardiovascular: Negative.  Negative for chest pain and leg swelling.  Gastrointestinal: Negative.  Negative for abdominal pain.  Genitourinary:  Positive for flank pain. Negative for dysuria.  Musculoskeletal:  Negative for joint pain and myalgias.  Skin: Negative.  Negative for rash.  Neurological: Negative.  Negative for dizziness, sensory change, focal weakness, weakness and headaches.  Psychiatric/Behavioral:  Negative.  The patient is not nervous/anxious.     As per HPI. Otherwise, a complete review of systems is negative.  ONCOLOGY HISTORY: Oncology History Overview Note  1. Bilateral carcinoma of breast, February, 2015. Right breast status post radical mastectomy. T1 C. N0M0 (isolated tumor cells in one lymph node) multifocal invasive cancer. Stage IC, Left breast, Status post radical mastectomy, T2, N1, M0 tumor. Stage II, RIght breast. Both tumors are estrogen receptor positive.  Progesterone receptor positive.  HER-2 receptor negative by FISH. Negative for BRCA mutation.  2. Started on Adriamycin and Cytoxan on March 01, 2014. Last cycle of Cytoxan and Adriamycin on May 01, 2014. Patient has finished last chemotherapy on September 9. (12 th dose was omitted because of neuropathy) 3.  Taking tamoxifen.  Patient had a poor tolerance to letrozole.(October, 2015) 4.  Patient is taking letrozole since November of 2015 5.  May, 2016 Letrozole has been put on hold because of bony pains 6.  Started on Aromasin from July of 2016    History of bilateral breast cancer (Resolved)  11/29/2013 Initial Diagnosis   Breast cancer bilateral, multifocal ER/PR pos, Her 2 neg,.     PAST MEDICAL HISTORY: Past Medical History:  Diagnosis Date   Anemia    Arthritis not really diagnosis but have pain   B12 deficiency 07/23/2016   Will check levels to determine if injections are needed again. Await results. Recent CBC at Onc WNL   Blood transfusion without reported diagnosis 1983 had a miscarriage/dnc   Cancer (HCC) 12/30/2013   Multifocal disease: pT1c,m, N0(isolated tumor cells) Er/ PR positive, Her 2 negative.   Cancer (HCC) 01/09/2014   Invasive lobular carcinoma, 2.2 cm, T2,N1 ER/PR  positive, HER-2/neu negative   Cataract 2014?   Centrilobular emphysema (HCC) 10/27/2021   CKD (chronic kidney disease), stage IV (HCC) 07/13/2023   Complication of anesthesia 03/06/2022   Prolonged metabolism of  Rocuronium during case.   Diabetes mellitus without complication (HCC)    Diffuse cystic mastopathy    Erosive esophagitis    Essential hypertension, benign 09/24/2017   Family history of malignant neoplasm of gastrointestinal tract 2012   Fractured elbow 08/28/2014   Gastroesophageal reflux disease    Hx of metabolic acidosis with increased anion gap    Increased heart rate    Multiple sclerosis (HCC)    Neuromuscular disorder (HCC)    neuropathy in bil feet - left is worse.   Obesity, unspecified    Peripheral neuropathy    Personal history of tobacco use, presenting hazards to health    Restless leg syndrome    Sleep apnea    uses CPAP   Special screening for malignant neoplasms, colon    Squamous cell carcinoma of skin 08/24/2022   SCCIS - scalp, ED&C   Squamous cell carcinoma of skin 07/20/2023   right of midline ant scalp post, tx with ED&C   Squamous cell carcinoma of skin 07/20/2023   right of midline ant scalp ant, tx with ED&C    PAST SURGICAL HISTORY: Past Surgical History:  Procedure Laterality Date   BREAST BIOPSY Right 1973,2001   BREAST BIOPSY Right 11/07/2013   INVASIVE MAMMARY CARCINOMA , ER/PR positive Her2 negative   BREAST BIOPSY Left 11/27/2013   invasive lobular and DCIS   BREAST SURGERY Bilateral 01/09/2014   mastectomy   cataract surgery Left 08/13/2022   CHOLECYSTECTOMY     COLONOSCOPY  2010   Dr. Evette Cristal   COLONOSCOPY WITH PROPOFOL N/A 06/11/2015   Procedure: COLONOSCOPY WITH PROPOFOL;  Surgeon: Kieth Brightly, MD;  Location: [facility] ENDOSCOPY;  Service: Endoscopy;  Laterality: N/A;   COLONOSCOPY WITH PROPOFOL N/A 12/23/2021   Procedure: COLONOSCOPY WITH PROPOFOL;  Surgeon: Toney Reil, MD;  Location: [facility] ENDOSCOPY;  Service: Gastroenterology;  Laterality: N/A;   DILATATION & CURETTAGE/HYSTEROSCOPY WITH MYOSURE N/A 07/12/2018   Procedure: DILATATION & CURETTAGE/HYSTEROSCOPY WITH MYOSURE WITH ENDOMETRIAL POLYPECTOMY;  Surgeon:  Conard Novak, MD;  Location: [facility] ORS;  Service: Gynecology;  Laterality: N/A;   DILATION AND CURETTAGE OF UTERUS  1983   ESOPHAGOGASTRODUODENOSCOPY N/A 12/23/2021   Procedure: ESOPHAGOGASTRODUODENOSCOPY (EGD);  Surgeon: Toney Reil, MD;  Location: [facility] ENDOSCOPY;  Service: Gastroenterology;  Laterality: N/A;   JOINT REPLACEMENT Left 11/23/2015   right  2023   KNEE ARTHROPLASTY Right 03/06/2022   Procedure: COMPUTER ASSISTED TOTAL KNEE ARTHROPLASTY;  Surgeon: Donato Heinz, MD;  Location: [facility] ORS;  Service: Orthopedics;  Laterality: Right;   LAPAROSCOPIC BILATERAL SALPINGO OOPHERECTOMY N/A 08/04/2023   Procedure: LAPAROSCOPIC BILATERAL SALPINGO OOPHORECTOMY;  Surgeon: Leida Lauth, MD;  Location: [facility] ORS;  Service: Gynecology;  Laterality: N/A;   POLYPECTOMY  1989   PORTA CATH REMOVAL     PORTACATH PLACEMENT  2015   SKIN BIOPSY     on scalp   SPINE SURGERY  09/14/2014   Ruptured disk L4- L5   TOTAL KNEE ARTHROPLASTY Left 11/22/2015   Procedure: TOTAL KNEE ARTHROPLASTY;  Surgeon: Frederico Hamman, MD;  Location: [facility] OR;  Service: Orthopedics;  Laterality: Left;   TUBAL LIGATION     WISDOM TOOTH EXTRACTION  2006    FAMILY HISTORY Family History  Problem Relation Age of Onset   Lung cancer Mother 13  COPD Mother    Colon cancer Father 37   Stomach cancer Father 77   Hypertension Sister    Breast cancer Maternal Aunt        dx over 49   Breast cancer Paternal Aunt        dx 60s   Breast cancer Paternal Aunt        dx 65s   Rectal cancer Paternal Uncle        dx 41s   Rectal cancer Paternal Uncle        dx 12s   Diabetes Maternal Grandmother    Pancreatic cancer Paternal Grandfather    Rectal cancer Cousin        dx 48s-60s   Rectal cancer Cousin        dx >50    GYNECOLOGIC HISTORY:  Patient's last menstrual period was 12/22/1999 (approximate).     ADVANCED DIRECTIVES:    HEALTH MAINTENANCE: Social History   Tobacco Use   Smoking status:  Former    Current packs/day: 0.00    Average packs/day: 1 pack/day for 30.0 years (30.0 ttl pk-yrs)    Types: Cigarettes    Start date: 12/12/1983    Quit date: 12/11/2013    Years since quitting: 10.0   Smokeless tobacco: Former  Building services engineer status: Never Used  Substance Use Topics   Alcohol use: Not Currently   Drug use: No     Colonoscopy:  PAP:  Bone density:  Mammogram:  No Known Allergies  Current Outpatient Medications  Medication Sig Dispense Refill   acetaminophen (TYLENOL) 500 MG tablet Take 1,000 mg by mouth 2 (two) times daily as needed for moderate pain or headache.     albuterol (VENTOLIN HFA) 108 (90 Base) MCG/ACT inhaler Inhale 2 puffs into the lungs every 4 (four) hours as needed for wheezing or shortness of breath. (Patient not taking: Reported on 11/11/2023) 8 g 2   baclofen (LIORESAL) 10 MG tablet Take 1 tablet (10 mg total) by mouth 3 (three) times daily as needed for muscle spasms. 1080 tablet 0   calcium carbonate (OSCAL) 1500 (600 Ca) MG TABS tablet Take by mouth 2 (two) times daily with a meal.     dapagliflozin propanediol (FARXIGA) 10 MG TABS tablet Take 10 mg by mouth daily.     diclofenac Sodium (VOLTAREN) 1 % GEL Apply 2 g topically 3 (three) times daily as needed. 100 g 2   ezetimibe (ZETIA) 10 MG tablet Take 1 tablet (10 mg total) by mouth daily. 90 tablet 3   fluticasone (FLONASE) 50 MCG/ACT nasal spray PLACE 2 SPRAYS INTO BOTH NOSTRILS AT BEDTIME. 48 g 1   fulvestrant (FASLODEX) 250 MG/5ML injection Inject 500 mg into the muscle every 30 (thirty) days. One injection each buttock over 1-2 minutes. Warm prior to use.     Krill Oil 1000 MG CAPS Take 1,000 mg by mouth daily.     lisinopril (ZESTRIL) 5 MG tablet Take 1 tablet (5 mg total) by mouth daily. 90 tablet 3   loperamide (IMODIUM A-D) 2 MG tablet Take 2 tablets (4 mg total) by mouth 4 (four) times daily as needed for diarrhea or loose stools. 30 tablet 0   metFORMIN (GLUCOPHAGE) 500 MG  tablet Take 1 tablet (500 mg total) by mouth 2 (two) times daily with a meal. 180 tablet 3   metoprolol tartrate (LOPRESSOR) 25 MG tablet TAKE 0.5 TABLETS BY MOUTH 2 TIMES DAILY. 90 tablet 0   omeprazole (  PRILOSEC) 40 MG capsule TAKE 1 CAPSULE BY MOUTH TWICE A DAY 180 capsule 1   ondansetron (ZOFRAN) 8 MG tablet Take 1 tablet (8 mg total) by mouth every 8 (eight) hours as needed for nausea or vomiting. 20 tablet 0   prochlorperazine (COMPAZINE) 10 MG tablet Take 1 tablet (10 mg total) by mouth every 6 (six) hours as needed for nausea or vomiting. 20 tablet 1   ribociclib succ (KISQALI 200MG  DAILY DOSE) 200 MG Therapy Pack Take 3 tablets (600 mg total) by mouth daily. Take for 21 days on, 7 days off, repeat every 28 days. 63 tablet 0   ribociclib succ (KISQALI, 600 MG DOSE,) 200 MG Therapy Pack Take 3 tablets (600 mg total) by mouth daily. Take for 21 days on, 7 days off, repeat every 28 days. 63 tablet 5   sodium chloride (OCEAN) 0.65 % SOLN nasal spray Place 1 spray into both nostrils as needed for congestion.     traZODone (DESYREL) 50 MG tablet TAKE 1 TABLET (50 MG TOTAL) BY MOUTH AT BEDTIME AS NEEDED. FOR SLEEP 90 tablet 1   venlafaxine XR (EFFEXOR-XR) 75 MG 24 hr capsule Take 1 capsule (75 mg total) by mouth daily with breakfast. 360 capsule 0   vitamin B-12 (CYANOCOBALAMIN) 1000 MCG tablet Take 2,500 mcg by mouth every Monday, Wednesday, and Friday.     Vitamin D, Cholecalciferol, 50 MCG (2000 UT) CAPS Take 2,000 Units by mouth every Monday, Wednesday, and Friday.     No current facility-administered medications for this visit.    OBJECTIVE: BP 118/70 (BP Location: Right Arm, Patient Position: Sitting, Cuff Size: Normal)   Pulse 69   Temp 97.8 F (36.6 C) (Tympanic)   Resp 16   Wt 193 lb (87.5 kg)   LMP 12/22/1999 (Approximate)   SpO2 97%   BMI 32.12 kg/m    Body mass index is 32.12 kg/m.    ECOG FS:0 - Asymptomatic  General: Well-developed, well-nourished, no acute  distress. Eyes: Pink conjunctiva, anicteric sclera. HEENT: Normocephalic, moist mucous membranes. Breast: Bilateral mastectomy. Lungs: No audible wheezing or coughing. Heart: Regular rate and rhythm. Abdomen: Soft, nontender, no obvious distention. Musculoskeletal: No edema, cyanosis, or clubbing. Neuro: Alert, answering all questions appropriately. Cranial nerves grossly intact. Skin: No rashes or petechiae noted. Psych: Normal affect.  LAB RESULTS:  Appointment on 12/10/2023  Component Date Value Ref Range Status   WBC 12/10/2023 4.4  4.0 - 10.5 K/uL Final   RBC 12/10/2023 2.62 (L)  3.87 - 5.11 MIL/uL Final   Hemoglobin 12/10/2023 9.4 (L)  12.0 - 15.0 g/dL Final   HCT 11/25/7251 28.7 (L)  36.0 - 46.0 % Final   MCV 12/10/2023 109.5 (H)  80.0 - 100.0 fL Final   MCH 12/10/2023 35.9 (H)  26.0 - 34.0 pg Final   MCHC 12/10/2023 32.8  30.0 - 36.0 g/dL Final   RDW 66/44/0347 15.1  11.5 - 15.5 % Final   Platelets 12/10/2023 189  150 - 400 K/uL Final   nRBC 12/10/2023 0.0  0.0 - 0.2 % Final   Neutrophils Relative % 12/10/2023 48  % Final   Neutro Abs 12/10/2023 2.2  1.7 - 7.7 K/uL Final   Lymphocytes Relative 12/10/2023 28  % Final   Lymphs Abs 12/10/2023 1.2  0.7 - 4.0 K/uL Final   Monocytes Relative 12/10/2023 17  % Final   Monocytes Absolute 12/10/2023 0.8  0.1 - 1.0 K/uL Final   Eosinophils Relative 12/10/2023 4  % Final  Eosinophils Absolute 12/10/2023 0.2  0.0 - 0.5 K/uL Final   Basophils Relative 12/10/2023 2  % Final   Basophils Absolute 12/10/2023 0.1  0.0 - 0.1 K/uL Final   Immature Granulocytes 12/10/2023 1  % Final   Abs Immature Granulocytes 12/10/2023 0.02  0.00 - 0.07 K/uL Final   Performed at [facility], [address] [place], [place] [address]   Sodium 12/10/2023 140  135 - 145 mmol/L Final   Potassium 12/10/2023 4.5  3.5 - 5.1 mmol/L Final   Chloride 12/10/2023 104  98 - 111 mmol/L Final   CO2 12/10/2023 23  22 - 32 mmol/L Final   Glucose, Bld  12/10/2023 119 (H)  70 - 99 mg/dL Final   Glucose reference range applies only to samples taken after fasting for at least 8 hours.   BUN 12/10/2023 36 (H)  8 - 23 mg/dL Final   Creatinine, Ser 12/10/2023 1.82 (H)  0.44 - 1.00 mg/dL Final   Calcium 60/45/4098 9.0  8.9 - 10.3 mg/dL Final   Total Protein 11/91/4782 7.2  6.5 - 8.1 g/dL Final   Albumin 95/62/1308 3.9  3.5 - 5.0 g/dL Final   AST 65/78/4696 27  15 - 41 U/L Final   ALT 12/10/2023 18  0 - 44 U/L Final   Alkaline Phosphatase 12/10/2023 77  38 - 126 U/L Final   Total Bilirubin 12/10/2023 0.4  0.0 - 1.2 mg/dL Final   GFR, Estimated 12/10/2023 29 (L)  >60 mL/min Final   Comment: (NOTE) Calculated using the CKD-EPI Creatinine Equation (2021)    Anion gap 12/10/2023 13  5 - 15 Final   Performed at [facility], [address] [place], [place] [address]    STUDIES: No results found.  ONCOLOGY HISTORY:  Bilateral adenocarcinoma of the breast. Patient is status post bilateral mastectomy in February 2015. She also received adjuvant chemotherapy with Adriamycin, Cytoxan, and Taxol completing treatment in approximately October 2015. She then initiated letrozole, but could not tolerate secondary to leg pain and was switched to Aromasin.  Patient then switched to tamoxifen in October 2019 secondary to cost.  Patient subsequently discontinued tamoxifen and did not complete the recommended 5 years of treatment.   ASSESSMENT: Recurrent, metastatic ER/PR, HER2 negative invasive carcinoma of the breast.   PLAN:    Recurrent, metastatic ER/PR, HER2 negative invasive carcinoma of the breast: See oncology history as above.  Biopsy confirmed recurrent disease.  PET scan results from December 26, 2021 reviewed independently with hypermetabolic left supraclavicular, subpectoral, and axillary lymphadenopathy consistent with nodal recurrence of patient's history of breast cancer.  No distant metastases were identified.  Her most recent PET scan on  May 26, 2023 reviewed independently with improved disease burden. Her initial CA 27-29 initially was 260.1.  Now seems to have plateaued between 78.8 and 98.5.  Her most result is 81.3.  Today's result is pending. Of note, patient also had metastatic breast cancer in her surgical specimen from her oophorectomy.  Continue Kisqali as prescribed.  Proceed with fulvestrant today.  Despite PET scan being completed on January 2025, we do not have a read from radiology yet.  Return to clinic in 4 weeks with repeat laboratory, further evaluation, and continuation of treatment.  Appreciate clinical pharmacy input.   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  Follow-up with gynecology oncology as scheduled. Osteopenia: Patient's last bone mineral density was on August 01, 2016 and revealed a T-score of -1.2. Continue monitoring  bone mineral density by PCP. Multiple sclerosis: Chronic and unchanged.  Continue evaluation and treatment per neurology.  Recent MRI of the brain did not reveal any new lesions. Renal insufficiency: GFR is 29 today.  Kisqali may need to be dose reduced if GFR remains persistently less than 30. Anemia: Chronic and unchanged.  Patient's hemoglobin is 9.4 today.  Monitor. Reflux: Continue omeprazole.    Patient expressed understanding and was in agreement with this plan. She also understands that She can call clinic at any time with any questions, concerns, or complaints.   Breast cancer bilateral, multifocal ER/PR pos, Her 2 neg.   Staging form: Breast, AJCC 7th Edition     Clinical: Stage IIB (T2, N1, M0) - Signed by Johney Maine, MD on 04/22/2015   Jeralyn Ruths, MD   12/10/2023 12:43 PM

## 2023-12-11 LAB — CANCER ANTIGEN 27.29: CA 27.29: 74.3 U/mL — ABNORMAL HIGH (ref 0.0–38.6)

## 2023-12-20 ENCOUNTER — Other Ambulatory Visit: Payer: Self-pay

## 2023-12-22 ENCOUNTER — Other Ambulatory Visit: Payer: Self-pay

## 2023-12-22 NOTE — Progress Notes (Signed)
Specialty Pharmacy Ongoing Clinical Assessment Note  Cassidy Norris is a 73 y.o. female who is being followed by the specialty pharmacy service for RxSp Oncology   Patient's specialty medication(s) reviewed today: Ribociclib Succinate (KISQALI 200mg  Daily Dose)  Patient has been on Kisqali previously, she is new to pharmacy.   Missed doses in the last 4 weeks: 0   Patient/Caregiver did not have any additional questions or concerns.   Therapeutic benefit summary: Unable to assess   Adverse events/side effects summary: Experienced adverse events/side effects (tolerable diarhea x 2 in the mornings after dose)   Patient's therapy is appropriate to: Continue    Goals Addressed             This Visit's Progress    Stabilization of disease       Patient is on track. Patient will be monitored by provider to determine if a change in treatment plan is warranted         Follow up:  3 months  Bobette Mo Specialty Pharmacist

## 2023-12-22 NOTE — Progress Notes (Signed)
Specialty Pharmacy Refill Coordination Note  Cassidy Norris is a 73 y.o. female contacted today regarding refills of specialty medication(s) Ribociclib Succinate (KISQALI 200mg  Daily Dose)   Patient requested Delivery   Delivery date: 12/30/23   Verified address: [address], [place], [place] [address]   Medication will be filled on 12/29/23.   Patient will complete cycle on 12/30/23. Next cycle will start 01/06/24.

## 2023-12-28 NOTE — Progress Notes (Signed)
 NEUROLOGY FOLLOW UP OFFICE NOTE  EULOGIA DISMORE [id number]  Assessment/Plan:   Multiple sclerosis Peripheral neuropathy Memory deficits.    1 D3 2000 IU three days weekly 2 Baclofen  10mg  TID as needed muscle cramps 3 Follow up one year  Subjective:  BALERIA WYMAN is a 73 year old right-handed woman with multiple sclerosis, breast cancer, type 2 diabetes, former smoker and s/p L4-5 laminectomy who follows up for multiple sclerosis   UPDATE: Current disease modifying therapy:  none Other medications:  baclofen  10mg  twice to three times daily; D3 2000 IU three times a week  Tired.  Some increase neuropathy in feet but nothing significant.  Memory stable.  Baclofen  helpful for spasms.   Vit D level from 2 months ago was 53.       HISTORY: In November 2001, she tripped and fell, hitting her head.  She went to the ED where a CT of the head was reportedly unremarkable.  She was advised to have an MRI of the brain, which was performed on 12/12/00.  It revealed small round and elliptical white matter lesions less than 1 cm in the periventricular region.  In December of 2002, she followed up with a neurologist, Dr. Poplawski.  VEP performed in 2003 showed prolonged p100 latency in the right optic nerve.  BAER was normal.  She reportedly had a lumbar puncture where CSF results were supportive of MS.  She was given 5 days of Solu-Medrol  followed by 3 weeks of oral prednisone .  She was advised to start an interferon, but due to possible side effects, she decided to hold off and see if she clinically progresses.  She did have a follow up MRI of the brain in December 2004, which reportedly showed an enhancing lesion as well as a remote lesion in the cerebellum in addition to the periventricular white matter lesions.  She never had any noticeable clinical flare-ups and had done well off of a disease-modifying agent.  Over the past few years, she notes possible worsening in depth perception.   Sometimes she notes difficulty grabbing for objects or will drive too close to the curb.   Imaging: 03/12/2015 MRI BRAIN & CERVICAL SPINE W WO: non-enhancing bilateral periventricular white matter lesions oriented perpendicular to the ventricles and corpus callosum, as well as involving temporal lobe white matter and mild involvement of the left side of cerebellum.  No cervical cord lesions.  01/28/2017 MRI BRAIN W WO:  "two small new or increased right hemisphere subcortical white matter lesions when compared to 2016" with no enhancement or active demyelination.  Otherwise, MRI is stable.   01/25/2018 MRI BRAIN W WO:  stable chronic white matter hyperintensities consistent with MS.  08/10/2019 MRI BRAIN W WO:  Multiple T2/FLAIR hyperintense foci without enhancement, stable compared to imaging from 03/27/2018. 10/01/2020 MRI BRAIN W WO:  stable compared to imaging from 08/10/2019 12/14/2021 MRI BRAIN W WO:  stable chronic demyelination with no new or active lesions.     In 2015, she was diagnosed with bilateral breast cancer.  She underwent bilateral mastectomy and chemotherapy, which ended in September.  As a result of the chemotherapy, she has neuropathy in the feet.  She also had problems with her left leg and was found to have a ruptured disc at L4-5 and underwent surgery in October 2015.  She has baseline numbness over the dorsum of left foot and lateral leg.  The toes in her left foot sometimes curl.  Since chemotherapy, she notes  some mild memory problems.  She sometimes misplaces objects or will forget if she locked the front door when she left.  She will sometimes have difficulty getting a word out.   Past DMT:  Tecfidera  (stopped in early 2020.  She didn't feel well on it - slurred speech, worse balance.  Also, she was tired with fighting with the insurance company to get it)  PAST MEDICAL HISTORY: Past Medical History:  Diagnosis Date   Anemia    Arthritis not really diagnosis but have  pain   B12 deficiency 07/23/2016   Will check levels to determine if injections are needed again. Await results. Recent CBC at [facility]   Blood transfusion without reported diagnosis 1983 had a miscarriage/dnc   Cancer (HCC) 12/30/2013   Multifocal disease: pT1c,m, N0(isolated tumor cells) Er/ PR positive, Her 2 negative.   Cancer (HCC) 01/09/2014   Invasive lobular carcinoma, 2.2 cm, T2,N1 ER/PR positive, HER-2/neu negative   Cataract 2014?   Centrilobular emphysema (HCC) 10/27/2021   CKD (chronic kidney disease), stage IV (HCC) 07/13/2023   Complication of anesthesia 03/06/2022   Prolonged metabolism of Rocuronium  during case.   Diabetes mellitus without complication (HCC)    Diffuse cystic mastopathy    Erosive esophagitis    Essential hypertension, benign 09/24/2017   Family history of malignant neoplasm of gastrointestinal tract 2012   Fractured elbow 08/28/2014   Gastroesophageal reflux disease    Hx of metabolic acidosis with increased anion gap    Increased heart rate    Multiple sclerosis (HCC)    Neuromuscular disorder (HCC)    neuropathy in bil feet - left is worse.   Obesity, unspecified    Peripheral neuropathy    Personal history of tobacco use, presenting hazards to health    Restless leg syndrome    Sleep apnea    uses CPAP   Special screening for malignant neoplasms, colon    Squamous cell carcinoma of skin 08/24/2022   SCCIS - scalp, ED&C   Squamous cell carcinoma of skin 07/20/2023   right of midline ant scalp post, tx with ED&C   Squamous cell carcinoma of skin 07/20/2023   right of midline ant scalp ant, tx with ED&C    MEDICATIONS: Current Outpatient Medications on File Prior to Visit  Medication Sig Dispense Refill   acetaminophen  (TYLENOL ) 500 MG tablet Take 1,000 mg by mouth 2 (two) times daily as needed for moderate pain or headache.     albuterol  (VENTOLIN  HFA) 108 (90 Base) MCG/ACT inhaler Inhale 2 puffs into the lungs every 4 (four) hours as  needed for wheezing or shortness of breath. (Patient not taking: Reported on 11/11/2023) 8 g 2   baclofen  (LIORESAL ) 10 MG tablet Take 1 tablet (10 mg total) by mouth 3 (three) times daily as needed for muscle spasms. 1080 tablet 0   calcium  carbonate (OSCAL) 1500 (600 Ca) MG TABS tablet Take by mouth 2 (two) times daily with a meal.     dapagliflozin propanediol (FARXIGA) 10 MG TABS tablet Take 10 mg by mouth daily.     diclofenac  Sodium (VOLTAREN ) 1 % GEL Apply 2 g topically 3 (three) times daily as needed. 100 g 2   ezetimibe  (ZETIA ) 10 MG tablet Take 1 tablet (10 mg total) by mouth daily. 90 tablet 3   fluticasone  (FLONASE ) 50 MCG/ACT nasal spray PLACE 2 SPRAYS INTO BOTH NOSTRILS AT BEDTIME. 48 g 1   fulvestrant  (FASLODEX ) 250 MG/5ML injection Inject 500 mg into the muscle every  30 (thirty) days. One injection each buttock over 1-2 minutes. Warm prior to use.     Krill Oil 1000 MG CAPS Take 1,000 mg by mouth daily.     lisinopril  (ZESTRIL ) 5 MG tablet Take 1 tablet (5 mg total) by mouth daily. 90 tablet 3   loperamide  (IMODIUM  A-D) 2 MG tablet Take 2 tablets (4 mg total) by mouth 4 (four) times daily as needed for diarrhea or loose stools. 30 tablet 0   metFORMIN  (GLUCOPHAGE ) 500 MG tablet Take 1 tablet (500 mg total) by mouth 2 (two) times daily with a meal. 180 tablet 3   metoprolol  tartrate (LOPRESSOR ) 25 MG tablet TAKE 0.5 TABLETS BY MOUTH 2 TIMES DAILY. 90 tablet 0   omeprazole  (PRILOSEC) 40 MG capsule TAKE 1 CAPSULE BY MOUTH TWICE A DAY 180 capsule 1   ondansetron  (ZOFRAN ) 8 MG tablet Take 1 tablet (8 mg total) by mouth every 8 (eight) hours as needed for nausea or vomiting. 20 tablet 0   prochlorperazine  (COMPAZINE ) 10 MG tablet Take 1 tablet (10 mg total) by mouth every 6 (six) hours as needed for nausea or vomiting. 20 tablet 1   ribociclib  succ (KISQALI  200MG  DAILY DOSE) 200 MG Therapy Pack Take 3 tablets (600 mg total) by mouth daily. Take for 21 days on, 7 days off, repeat every 28  days. 63 tablet 0   ribociclib  succ (KISQALI , 600 MG DOSE,) 200 MG Therapy Pack Take 3 tablets (600 mg total) by mouth daily. Take for 21 days on, 7 days off, repeat every 28 days. 63 tablet 5   sodium chloride  (OCEAN) 0.65 % SOLN nasal spray Place 1 spray into both nostrils as needed for congestion.     traZODone  (DESYREL ) 50 MG tablet TAKE 1 TABLET (50 MG TOTAL) BY MOUTH AT BEDTIME AS NEEDED. FOR SLEEP 90 tablet 1   venlafaxine  XR (EFFEXOR -XR) 75 MG 24 hr capsule Take 1 capsule (75 mg total) by mouth daily with breakfast. 360 capsule 0   vitamin B-12 (CYANOCOBALAMIN ) 1000 MCG tablet Take 2,500 mcg by mouth every Monday, Wednesday, and Friday.     Vitamin D , Cholecalciferol , 50 MCG (2000 UT) CAPS Take 2,000 Units by mouth every Monday, Wednesday, and Friday.     No current facility-administered medications on file prior to visit.    ALLERGIES: No Known Allergies  FAMILY HISTORY: Family History  Problem Relation Age of Onset   Lung cancer Mother 68   COPD Mother    Colon cancer Father 21   Stomach cancer Father 66   Hypertension Sister    Breast cancer Maternal Aunt        dx over 45   Breast cancer Paternal Aunt        dx 56s   Breast cancer Paternal Aunt        dx 48s   Rectal cancer Paternal Uncle        dx 77s   Rectal cancer Paternal Uncle        dx 45s   Diabetes Maternal Grandmother    Pancreatic cancer Paternal Grandfather    Rectal cancer Cousin        dx 50s-60s   Rectal cancer Cousin        dx >50      Objective:  Height 5' 5 (1.651 m), weight 198 lb (89.8 kg), last menstrual period 12/22/1999, SpO2 91%. General: No acute distress.  Patient appears well-groomed.   Head:  Normocephalic/atraumatic Eyes:  Fundi examined but not visualized  Heart:  Regular rate and rhythm Neurological Exam:  alert and oriented.  Speech fluent and not dysarthric, language intact.  CN II-XII intact. Bulk and tone normal, muscle strength 5/5 throughout.  Sensation to pinprick and  vibration reduced in feet.  Deep tendon reflexes trace throughout.  Finger to nose testing intact.  Gait cautious, Romberg positive.   Juliene Dunnings, DO  CC: Marsa Officer, DO

## 2023-12-29 ENCOUNTER — Other Ambulatory Visit: Payer: Self-pay | Admitting: Pharmacist

## 2023-12-29 ENCOUNTER — Encounter: Payer: Self-pay | Admitting: Neurology

## 2023-12-29 ENCOUNTER — Ambulatory Visit (INDEPENDENT_AMBULATORY_CARE_PROVIDER_SITE_OTHER): Payer: Medicare Other | Admitting: Neurology

## 2023-12-29 ENCOUNTER — Other Ambulatory Visit: Payer: Self-pay

## 2023-12-29 VITALS — BP 137/81 | Ht 65.0 in | Wt 198.0 lb

## 2023-12-29 DIAGNOSIS — G629 Polyneuropathy, unspecified: Secondary | ICD-10-CM | POA: Diagnosis not present

## 2023-12-29 DIAGNOSIS — R4189 Other symptoms and signs involving cognitive functions and awareness: Secondary | ICD-10-CM

## 2023-12-29 DIAGNOSIS — G35 Multiple sclerosis: Secondary | ICD-10-CM | POA: Diagnosis not present

## 2023-12-29 DIAGNOSIS — C50919 Malignant neoplasm of unspecified site of unspecified female breast: Secondary | ICD-10-CM

## 2023-12-29 MED ORDER — RIBOCICLIB SUCC (600 MG DOSE) 200 MG PO TBPK
600.0000 mg | ORAL_TABLET | Freq: Every day | ORAL | 5 refills | Status: DC
  Filled 2023-12-29: qty 63, 28d supply, fill #0
  Filled 2024-01-25: qty 63, 28d supply, fill #1
  Filled 2024-02-21: qty 63, 28d supply, fill #2
  Filled 2024-03-13: qty 63, 28d supply, fill #3
  Filled 2024-04-10: qty 63, 28d supply, fill #4

## 2023-12-29 MED ORDER — BACLOFEN 10 MG PO TABS: 10.0000 mg | ORAL_TABLET | Freq: Three times a day (TID) | ORAL | 1 refills | Status: DC | PRN

## 2023-12-29 NOTE — Patient Instructions (Signed)
 Continue D3 2000 units daily Baclofen  three times daily as needed

## 2023-12-31 ENCOUNTER — Ambulatory Visit (INDEPENDENT_AMBULATORY_CARE_PROVIDER_SITE_OTHER): Payer: Medicare Other | Admitting: Family Medicine

## 2023-12-31 VITALS — BP 120/62 | HR 102 | Resp 18 | Ht 65.0 in | Wt 195.2 lb

## 2023-12-31 DIAGNOSIS — Z87891 Personal history of nicotine dependence: Secondary | ICD-10-CM | POA: Diagnosis not present

## 2023-12-31 DIAGNOSIS — J432 Centrilobular emphysema: Secondary | ICD-10-CM

## 2023-12-31 DIAGNOSIS — J0110 Acute frontal sinusitis, unspecified: Secondary | ICD-10-CM | POA: Diagnosis not present

## 2023-12-31 MED ORDER — AMOXICILLIN-POT CLAVULANATE 875-125 MG PO TABS: 1.0000 | ORAL_TABLET | Freq: Two times a day (BID) | ORAL | 0 refills | Status: DC

## 2023-12-31 MED ORDER — PREDNISONE 20 MG PO TABS: ORAL_TABLET | ORAL | 0 refills | Status: DC

## 2023-12-31 NOTE — Patient Instructions (Addendum)
 Thank you for coming to the office today.  Likely bronchitis vs sinusitis associated with Emphysema  Start Augmentin  course and Prednisone   Lung Cancer Screening Program Low Dose CT (LDCT) [facility] [address] [place], [place]  [address]  Karna Doom RN [phone]   Please schedule a Follow-up Appointment to: Return if symptoms worsen or fail to improve.  If you have any other questions or concerns, please feel free to call the office or send a message through MyChart. You may also schedule an earlier appointment if necessary.  Additionally, you may be receiving a survey about your experience at our office within a few days to 1 week by e-mail or mail. We value your feedback.  Marsa Officer, DO [facility], [place]

## 2023-12-31 NOTE — Progress Notes (Signed)
 Subjective:    Patient ID: Cassidy Norris, female    DOB: [birth year], 73 y.o.   MRN: [id number]  Cassidy Norris is a 73 y.o. female presenting on 12/31/2023 for Cough (And congestion for 5 days )   HPI  Discussed the use of AI scribe software for clinical note transcription with the patient, who gave verbal consent to proceed.  History of Present Illness    Cassidy Norris is a 73 year old female with emphysema who presents with chest congestion and sinus issues.  She has been experiencing chest congestion and nasal congestion for approximately five days, starting on Monday night. The chest congestion is more severe than previous episodes and is accompanied by a rattling sound when she coughs, although coughing is infrequent. She has been using DayQuil and NyQuil for symptom management.  She has a history of emphysema and is overdue for a lung cancer screening, which she has not had in three to five years. She quit smoking ten years ago and is concerned about the lack of follow-up for her lung health since a change in her care team.  She recalls a similar episode in late September or October, for which she was seen on November 5th. At that time, she was treated with a rescue inhaler and cough pearls. She has not been on any antibiotics recently and is currently taking medication for cholesterol.  She feels extremely tired, sleeping almost all day and night, which she attributes to her current illness. She has a history of cancer and notes increased susceptibility to illnesses since her initial cancer diagnosis. She is currently on a week off from her cancer medication, Kisqali , and is due to resume it next Thursday.          11/02/2023    9:24 AM 09/28/2023    2:30 PM 07/15/2023    8:33 AM  Depression screen PHQ 2/9  Decreased Interest 0 1 0  Down, Depressed, Hopeless 0 0 0  PHQ - 2 Score 0 1 0  Altered sleeping 2 0 0  Tired, decreased energy 2 1 0  Change in appetite 0 1 0   Feeling bad or failure about yourself  0 0 0  Trouble concentrating 0 0 0  Moving slowly or fidgety/restless 0 0 0  Suicidal thoughts 0 0 0  PHQ-9 Score 4 3 0  Difficult doing work/chores  Not difficult at all Not difficult at all       11/02/2023    9:24 AM 09/28/2023    2:30 PM 07/13/2023    8:40 AM 04/30/2023    8:22 AM  GAD 7 : Generalized Anxiety Score  Nervous, Anxious, on Edge 0 0 1 0  Control/stop worrying 0 0 1 0  Worry too much - different things 0 0 0 0  Trouble relaxing 0 0 0 2  Restless 0 0 0 0  Easily annoyed or irritable 1 1 1 1   Afraid - awful might happen 0 0 0 0  Total GAD 7 Score 1 1 3 3   Anxiety Difficulty   Not difficult at all Not difficult at all    Social History   Tobacco Use   Smoking status: Former    Current packs/day: 0.00    Average packs/day: 1 pack/day for 30.0 years (30.0 ttl pk-yrs)    Types: Cigarettes    Start date: 12/12/1983    Quit date: 12/11/2013    Years since quitting: 10.0   Smokeless tobacco: Former  Vaping Use   Vaping status: Never Used  Substance Use Topics   Alcohol use: Not Currently   Drug use: No    Review of Systems Per HPI unless specifically indicated above     Objective:    BP 120/62 (BP Location: Right Arm, Patient Position: Sitting, Cuff Size: Normal)   Pulse (!) 102   Resp 18   Ht 5' 5 (1.651 m)   Wt 195 lb 3.2 oz (88.5 kg)   LMP 12/22/1999 (Approximate)   SpO2 96%   BMI 32.48 kg/m   Wt Readings from Last 3 Encounters:  12/31/23 195 lb 3.2 oz (88.5 kg)  12/29/23 198 lb (89.8 kg)  12/10/23 193 lb (87.5 kg)    Physical Exam Vitals and nursing note reviewed.  Constitutional:      General: She is not in acute distress.    Appearance: She is well-developed. She is not diaphoretic.     Comments: Well-appearing, comfortable, cooperative  HENT:     Head: Normocephalic and atraumatic.     Nose: Congestion present.  Eyes:     General:        Right eye: No discharge.        Left eye: No discharge.      Conjunctiva/sclera: Conjunctivae normal.  Neck:     Thyroid : No thyromegaly.  Cardiovascular:     Rate and Rhythm: Normal rate and regular rhythm.     Heart sounds: Normal heart sounds. No murmur heard. Pulmonary:     Effort: Pulmonary effort is normal. No respiratory distress.     Breath sounds: No wheezing, rhonchi or rales.     Comments: Coarse breath sound, cough Musculoskeletal:        General: Normal range of motion.     Cervical back: Normal range of motion and neck supple.  Lymphadenopathy:     Cervical: No cervical adenopathy.  Skin:    General: Skin is warm and dry.     Findings: No erythema or rash.  Neurological:     Mental Status: She is alert and oriented to person, place, and time.  Psychiatric:        Behavior: Behavior normal.     Comments: Well groomed, good eye contact, normal speech and thoughts     Results for orders placed or performed in visit on 12/10/23  Cancer Antigen 27.29   Collection Time: 12/10/23 10:06 AM  Result Value Ref Range   CA 27.29 74.3 (H) 0.0 - 38.6 U/mL  CBC with Differential/Platelet   Collection Time: 12/10/23 10:06 AM  Result Value Ref Range   WBC 4.4 4.0 - 10.5 K/uL   RBC 2.62 (L) 3.87 - 5.11 MIL/uL   Hemoglobin 9.4 (L) 12.0 - 15.0 g/dL   HCT 71.2 (L) 63.9 - 53.9 %   MCV 109.5 (H) 80.0 - 100.0 fL   MCH 35.9 (H) 26.0 - 34.0 pg   MCHC 32.8 30.0 - 36.0 g/dL   RDW 84.8 88.4 - 84.4 %   Platelets 189 150 - 400 K/uL   nRBC 0.0 0.0 - 0.2 %   Neutrophils Relative % 48 %   Neutro Abs 2.2 1.7 - 7.7 K/uL   Lymphocytes Relative 28 %   Lymphs Abs 1.2 0.7 - 4.0 K/uL   Monocytes Relative 17 %   Monocytes Absolute 0.8 0.1 - 1.0 K/uL   Eosinophils Relative 4 %   Eosinophils Absolute 0.2 0.0 - 0.5 K/uL   Basophils Relative 2 %   Basophils Absolute 0.1  0.0 - 0.1 K/uL   Immature Granulocytes 1 %   Abs Immature Granulocytes 0.02 0.00 - 0.07 K/uL  Comprehensive metabolic panel   Collection Time: 12/10/23 10:06 AM  Result Value Ref  Range   Sodium 140 135 - 145 mmol/L   Potassium 4.5 3.5 - 5.1 mmol/L   Chloride 104 98 - 111 mmol/L   CO2 23 22 - 32 mmol/L   Glucose, Bld 119 (H) 70 - 99 mg/dL   BUN 36 (H) 8 - 23 mg/dL   Creatinine, Ser 8.17 (H) 0.44 - 1.00 mg/dL   Calcium  9.0 8.9 - 10.3 mg/dL   Total Protein 7.2 6.5 - 8.1 g/dL   Albumin 3.9 3.5 - 5.0 g/dL   AST 27 15 - 41 U/L   ALT 18 0 - 44 U/L   Alkaline Phosphatase 77 38 - 126 U/L   Total Bilirubin 0.4 0.0 - 1.2 mg/dL   GFR, Estimated 29 (L) >60 mL/min   Anion gap 13 5 - 15      Assessment & Plan:   Problem List Items Addressed This Visit     Centrilobular emphysema (HCC) - Primary   Relevant Medications   predniSONE  (DELTASONE ) 20 MG tablet   Former smoker   Relevant Orders   Ambulatory Referral Lung Cancer Screening [facility]   Other Visit Diagnoses       Acute non-recurrent frontal sinusitis       Relevant Medications   predniSONE  (DELTASONE ) 20 MG tablet   amoxicillin -clavulanate (AUGMENTIN ) 875-125 MG tablet       Sinusitis / Bronchitis Symptoms of chest congestion and sinus congestion for approximately 5 days. No recent antibiotics. Coarse breath sounds on exam, but no high pitched wheezing or crackles. Immunocompromised w/ current cancer therapy  -Start Augmentin  and Prednisone  to aggressively treat potential bronchitis and prevent exacerbation of underlying emphysema or risk of progression to pneumonia -Continue DayQuil and Nyquil as needed for symptom management.  -Referral placed for lung cancer screening with [facility] team in [place].  Follow-up -If no improvement, patient to reach out for further management.         Orders Placed This Encounter  Procedures   Ambulatory Referral Lung Cancer Screening [facility]    Referral Priority:   Routine    Referral Type:   Consultation    Referral Reason:   Specialty Services Required    Number of Visits Requested:   1    Meds ordered this encounter   Medications   predniSONE  (DELTASONE ) 20 MG tablet    Sig: Take daily with food. Start with 60mg  (3 pills) x 2 days, then reduce to 40mg  (2 pills) x 2 days, then 20mg  (1 pill) x 3 days    Dispense:  13 tablet    Refill:  0   amoxicillin -clavulanate (AUGMENTIN ) 875-125 MG tablet    Sig: Take 1 tablet by mouth 2 (two) times daily.    Dispense:  20 tablet    Refill:  0    Follow up plan: Return if symptoms worsen or fail to improve.    Marsa Officer, DO [facility] 12/31/2023, 2:06 PM

## 2024-01-06 ENCOUNTER — Inpatient Hospital Stay: Payer: Medicare Other | Admitting: Pharmacist

## 2024-01-06 ENCOUNTER — Inpatient Hospital Stay: Payer: Medicare Other

## 2024-01-06 ENCOUNTER — Inpatient Hospital Stay (HOSPITAL_BASED_OUTPATIENT_CLINIC_OR_DEPARTMENT_OTHER): Payer: Medicare Other | Admitting: Oncology

## 2024-01-06 ENCOUNTER — Other Ambulatory Visit: Payer: Self-pay | Admitting: *Deleted

## 2024-01-06 ENCOUNTER — Encounter: Payer: Self-pay | Admitting: Oncology

## 2024-01-06 ENCOUNTER — Inpatient Hospital Stay: Payer: Medicare Other | Attending: Oncology

## 2024-01-06 VITALS — BP 118/48 | HR 66 | Temp 97.9°F | Wt 195.0 lb

## 2024-01-06 DIAGNOSIS — Z5111 Encounter for antineoplastic chemotherapy: Secondary | ICD-10-CM | POA: Insufficient documentation

## 2024-01-06 DIAGNOSIS — C50919 Malignant neoplasm of unspecified site of unspecified female breast: Secondary | ICD-10-CM | POA: Diagnosis not present

## 2024-01-06 DIAGNOSIS — Z170 Estrogen receptor positive status [ER+]: Secondary | ICD-10-CM | POA: Diagnosis not present

## 2024-01-06 DIAGNOSIS — Z1732 Human epidermal growth factor receptor 2 negative status: Secondary | ICD-10-CM | POA: Diagnosis not present

## 2024-01-06 DIAGNOSIS — Z1721 Progesterone receptor positive status: Secondary | ICD-10-CM | POA: Diagnosis not present

## 2024-01-06 LAB — COMPREHENSIVE METABOLIC PANEL
ALT: 21 U/L (ref 0–44)
AST: 22 U/L (ref 15–41)
Albumin: 3.6 g/dL (ref 3.5–5.0)
Alkaline Phosphatase: 83 U/L (ref 38–126)
Anion gap: 13 (ref 5–15)
BUN: 37 mg/dL — ABNORMAL HIGH (ref 8–23)
CO2: 22 mmol/L (ref 22–32)
Calcium: 8.8 mg/dL — ABNORMAL LOW (ref 8.9–10.3)
Chloride: 106 mmol/L (ref 98–111)
Creatinine, Ser: 1.44 mg/dL — ABNORMAL HIGH (ref 0.44–1.00)
GFR, Estimated: 39 mL/min — ABNORMAL LOW (ref >60)
Glucose, Bld: 130 mg/dL — ABNORMAL HIGH (ref 70–99)
Potassium: 3.8 mmol/L (ref 3.5–5.1)
Sodium: 141 mmol/L (ref 135–145)
Total Bilirubin: 0.5 mg/dL (ref 0.0–1.2)
Total Protein: 7.3 g/dL (ref 6.5–8.1)

## 2024-01-06 LAB — CBC WITH DIFFERENTIAL/PLATELET
Abs Immature Granulocytes: 0.36 10*3/uL — ABNORMAL HIGH (ref 0.00–0.07)
Basophils Absolute: 0.1 10*3/uL (ref 0.0–0.1)
Basophils Relative: 1 %
Eosinophils Absolute: 0.1 10*3/uL (ref 0.0–0.5)
Eosinophils Relative: 1 %
HCT: 28.8 % — ABNORMAL LOW (ref 36.0–46.0)
Hemoglobin: 9.6 g/dL — ABNORMAL LOW (ref 12.0–15.0)
Immature Granulocytes: 5 %
Lymphocytes Relative: 24 %
Lymphs Abs: 1.6 10*3/uL (ref 0.7–4.0)
MCH: 36 pg — ABNORMAL HIGH (ref 26.0–34.0)
MCHC: 33.3 g/dL (ref 30.0–36.0)
MCV: 107.9 fL — ABNORMAL HIGH (ref 80.0–100.0)
Monocytes Absolute: 0.8 10*3/uL (ref 0.1–1.0)
Monocytes Relative: 12 %
Neutro Abs: 3.8 10*3/uL (ref 1.7–7.7)
Neutrophils Relative %: 57 %
Platelets: 199 10*3/uL (ref 150–400)
RBC: 2.67 MIL/uL — ABNORMAL LOW (ref 3.87–5.11)
RDW: 14.3 % (ref 11.5–15.5)
WBC: 6.7 10*3/uL (ref 4.0–10.5)
nRBC: 0.5 % — ABNORMAL HIGH (ref 0.0–0.2)

## 2024-01-06 MED ORDER — FULVESTRANT 250 MG/5ML IM SOSY
500.0000 mg | PREFILLED_SYRINGE | Freq: Once | INTRAMUSCULAR | Status: AC
  Administered 2024-01-06: 500 mg via INTRAMUSCULAR
  Filled 2024-01-06: qty 10

## 2024-01-06 MED ORDER — LOPERAMIDE HCL 2 MG PO TABS: 4.0000 mg | ORAL_TABLET | Freq: Four times a day (QID) | ORAL | 0 refills | Status: DC | PRN

## 2024-01-06 NOTE — Progress Notes (Signed)
[facility]  Telephone:([phone]) [phone]  Fax:([phone]) [phone]     Cassidy Norris DOB: 05/19/1951  MR#: [id number]  AOZ#:[id number]  Patient Care Team: Smitty Cords, DO as PCP - General (Family Medicine) Iran Ouch, MD as PCP - Cardiology (Cardiology) Drema Dallas, DO as Consulting Physician (Neurology) Jeralyn Ruths, MD as Consulting Physician (Oncology) Benita Gutter, RN as Oncology Nurse Navigator Isla Pence, OD (Optometry)  CHIEF COMPLAINT: Recurrent, metastatic ER/PR, HER2 negative invasive carcinoma of the breast.    INTERVAL HISTORY: Patient returns to clinic today for further evaluation and continuation of Kisqali and fulvestrant.  Her right flank pain is evident, but has improved.  She otherwise feels well and is asymptomatic.  She continues to tolerate her treatments without significant side effects.  She denies any weakness or fatigue.  She denies any other pain. She has no new neurologic complaints.  She denies any recent fevers or illnesses.  She has a fair appetite and denies weight loss.  She has no chest pain, shortness of breath, cough, or hemoptysis.  She denies any nausea, vomiting, constipation, or diarrhea.  She has no urinary complaints.  Patient offers no further specific complaints today.  REVIEW OF SYSTEMS:   Review of Systems  Constitutional: Negative.  Negative for fever, malaise/fatigue and weight loss.  Respiratory: Negative.  Negative for cough, hemoptysis and shortness of breath.   Cardiovascular: Negative.  Negative for chest pain and leg swelling.  Gastrointestinal: Negative.  Negative for abdominal pain.  Genitourinary:  Positive for flank pain. Negative for dysuria.  Musculoskeletal:  Negative for joint pain and myalgias.  Skin: Negative.  Negative for rash.  Neurological: Negative.  Negative for dizziness, sensory change, focal weakness, weakness and headaches.  Psychiatric/Behavioral: Negative.  The  patient is not nervous/anxious.     As per HPI. Otherwise, a complete review of systems is negative.  ONCOLOGY HISTORY: Oncology History Overview Note  1. Bilateral carcinoma of breast, February, 2015. Right breast status post radical mastectomy. T1 C. N0M0 (isolated tumor cells in one lymph node) multifocal invasive cancer. Stage IC, Left breast, Status post radical mastectomy, T2, N1, M0 tumor. Stage II, RIght breast. Both tumors are estrogen receptor positive.  Progesterone receptor positive.  HER-2 receptor negative by FISH. Negative for BRCA mutation.  2. Started on Adriamycin and Cytoxan on March 01, 2014. Last cycle of Cytoxan and Adriamycin on May 01, 2014. Patient has finished last chemotherapy on September 9. (12 th dose was omitted because of neuropathy) 3.  Taking tamoxifen.  Patient had a poor tolerance to letrozole.(October, 2015) 4.  Patient is taking letrozole since November of 2015 5.  May, 2016 Letrozole has been put on hold because of bony pains 6.  Started on Aromasin from July of 2016    History of bilateral breast cancer (Resolved)  11/29/2013 Initial Diagnosis   Breast cancer bilateral, multifocal ER/PR pos, Her 2 neg,.     PAST MEDICAL HISTORY: Past Medical History:  Diagnosis Date   Anemia    Arthritis not really diagnosis but have pain   B12 deficiency 07/23/2016   Will check levels to determine if injections are needed again. Await results. Recent CBC at Onc WNL   Blood transfusion without reported diagnosis 1983 had a miscarriage/dnc   Cancer (HCC) 12/30/2013   Multifocal disease: pT1c,m, N0(isolated tumor cells) Er/ PR positive, Her 2 negative.   Cancer (HCC) 01/09/2014   Invasive lobular carcinoma, 2.2 cm, T2,N1 ER/PR positive, HER-2/neu negative  Cataract 2014?   Centrilobular emphysema (HCC) 10/27/2021   CKD (chronic kidney disease), stage IV (HCC) 07/13/2023   Complication of anesthesia 03/06/2022   Prolonged metabolism of Rocuronium during  case.   Diabetes mellitus without complication (HCC)    Diffuse cystic mastopathy    Erosive esophagitis    Essential hypertension, benign 09/24/2017   Family history of malignant neoplasm of gastrointestinal tract 2012   Fractured elbow 08/28/2014   Gastroesophageal reflux disease    Hx of metabolic acidosis with increased anion gap    Increased heart rate    Multiple sclerosis (HCC)    Neuromuscular disorder (HCC)    neuropathy in bil feet - left is worse.   Obesity, unspecified    Peripheral neuropathy    Personal history of tobacco use, presenting hazards to health    Restless leg syndrome    Sleep apnea    uses CPAP   Special screening for malignant neoplasms, colon    Squamous cell carcinoma of skin 08/24/2022   SCCIS - scalp, ED&C   Squamous cell carcinoma of skin 07/20/2023   right of midline ant scalp post, tx with ED&C   Squamous cell carcinoma of skin 07/20/2023   right of midline ant scalp ant, tx with ED&C    PAST SURGICAL HISTORY: Past Surgical History:  Procedure Laterality Date   BREAST BIOPSY Right 1973,2001   BREAST BIOPSY Right 11/07/2013   INVASIVE MAMMARY CARCINOMA , ER/PR positive Her2 negative   BREAST BIOPSY Left 11/27/2013   invasive lobular and DCIS   BREAST SURGERY Bilateral 01/09/2014   mastectomy   cataract surgery Left 08/13/2022   CHOLECYSTECTOMY     COLONOSCOPY  2010   Dr. Evette Cristal   COLONOSCOPY WITH PROPOFOL N/A 06/11/2015   Procedure: COLONOSCOPY WITH PROPOFOL;  Surgeon: Kieth Brightly, MD;  Location: [facility] ENDOSCOPY;  Service: Endoscopy;  Laterality: N/A;   COLONOSCOPY WITH PROPOFOL N/A 12/23/2021   Procedure: COLONOSCOPY WITH PROPOFOL;  Surgeon: Toney Reil, MD;  Location: [facility] ENDOSCOPY;  Service: Gastroenterology;  Laterality: N/A;   DILATATION & CURETTAGE/HYSTEROSCOPY WITH MYOSURE N/A 07/12/2018   Procedure: DILATATION & CURETTAGE/HYSTEROSCOPY WITH MYOSURE WITH ENDOMETRIAL POLYPECTOMY;  Surgeon: Conard Novak, MD;   Location: [facility] ORS;  Service: Gynecology;  Laterality: N/A;   DILATION AND CURETTAGE OF UTERUS  1983   ESOPHAGOGASTRODUODENOSCOPY N/A 12/23/2021   Procedure: ESOPHAGOGASTRODUODENOSCOPY (EGD);  Surgeon: Toney Reil, MD;  Location[facility] ENDOSCOPY;  Service: Gastroenterology;  Laterality: N/A;   JOINT REPLACEMENT Left 11/23/2015   right  2023   KNEE ARTHROPLASTY Right 03/06/2022   Procedure: COMPUTER ASSISTED TOTAL KNEE ARTHROPLASTY;  Surgeon: Donato Heinz, MD;  Location: [facility] ORS;  Service: Orthopedics;  Laterality: Right;   LAPAROSCOPIC BILATERAL SALPINGO OOPHERECTOMY N/A 08/04/2023   Procedure: LAPAROSCOPIC BILATERAL SALPINGO OOPHORECTOMY;  Surgeon: Leida Lauth, MD;  Location: [facility] ORS;  Service: Gynecology;  Laterality: N/A;   POLYPECTOMY  1989   PORTA CATH REMOVAL     PORTACATH PLACEMENT  2015   SKIN BIOPSY     on scalp   SPINE SURGERY  09/14/2014   Ruptured disk L4- L5   TOTAL KNEE ARTHROPLASTY Left 11/22/2015   Procedure: TOTAL KNEE ARTHROPLASTY;  Surgeon: Frederico Hamman, MD;  Location: [facility] OR;  Service: Orthopedics;  Laterality: Left;   TUBAL LIGATION     WISDOM TOOTH EXTRACTION  2006    FAMILY HISTORY Family History  Problem Relation Age of Onset   Lung cancer Mother 78   COPD Mother  Colon cancer Father 8   Stomach cancer Father 50   Hypertension Sister    Breast cancer Maternal Aunt        dx over 90   Breast cancer Paternal Aunt        dx 83s   Breast cancer Paternal Aunt        dx 79s   Rectal cancer Paternal Uncle        dx 10s   Rectal cancer Paternal Uncle        dx 59s   Diabetes Maternal Grandmother    Pancreatic cancer Paternal Grandfather    Rectal cancer Cousin        dx 61s-60s   Rectal cancer Cousin        dx >50    GYNECOLOGIC HISTORY:  Patient's last menstrual period was 12/22/1999 (approximate).     ADVANCED DIRECTIVES:    HEALTH MAINTENANCE: Social History   Tobacco Use   Smoking status: Former    Current packs/day:  0.00    Average packs/day: 1 pack/day for 30.0 years (30.0 ttl pk-yrs)    Types: Cigarettes    Start date: 12/12/1983    Quit date: 12/11/2013    Years since quitting: 10.0   Smokeless tobacco: Former  Building services engineer status: Never Used  Substance Use Topics   Alcohol use: Not Currently   Drug use: No     Colonoscopy:  PAP:  Bone density:  Mammogram:  No Known Allergies  Current Outpatient Medications  Medication Sig Dispense Refill   acetaminophen (TYLENOL) 500 MG tablet Take 1,000 mg by mouth 2 (two) times daily as needed for moderate pain or headache.     albuterol (VENTOLIN HFA) 108 (90 Base) MCG/ACT inhaler Inhale 2 puffs into the lungs every 4 (four) hours as needed for wheezing or shortness of breath. 8 g 2   amoxicillin-clavulanate (AUGMENTIN) 875-125 MG tablet Take 1 tablet by mouth 2 (two) times daily. 20 tablet 0   baclofen (LIORESAL) 10 MG tablet Take 1 tablet (10 mg total) by mouth 3 (three) times daily as needed for muscle spasms. 270 tablet 1   calcium carbonate (OSCAL) 1500 (600 Ca) MG TABS tablet Take by mouth 2 (two) times daily with a meal.     dapagliflozin propanediol (FARXIGA) 10 MG TABS tablet Take 10 mg by mouth daily.     diclofenac Sodium (VOLTAREN) 1 % GEL Apply 2 g topically 3 (three) times daily as needed. 100 g 2   ezetimibe (ZETIA) 10 MG tablet Take 1 tablet (10 mg total) by mouth daily. 90 tablet 3   fluticasone (FLONASE) 50 MCG/ACT nasal spray PLACE 2 SPRAYS INTO BOTH NOSTRILS AT BEDTIME. 48 g 1   fulvestrant (FASLODEX) 250 MG/5ML injection Inject 500 mg into the muscle every 30 (thirty) days. One injection each buttock over 1-2 minutes. Warm prior to use.     Krill Oil 1000 MG CAPS Take 1,000 mg by mouth daily.     lisinopril (ZESTRIL) 5 MG tablet Take 1 tablet (5 mg total) by mouth daily. 90 tablet 3   loperamide (IMODIUM A-D) 2 MG tablet Take 2 tablets (4 mg total) by mouth 4 (four) times daily as needed for diarrhea or loose stools. 30 tablet  0   metFORMIN (GLUCOPHAGE) 500 MG tablet Take 1 tablet (500 mg total) by mouth 2 (two) times daily with a meal. 180 tablet 3   metoprolol tartrate (LOPRESSOR) 25 MG tablet TAKE 0.5 TABLETS BY MOUTH 2  TIMES DAILY. 90 tablet 0   omeprazole (PRILOSEC) 40 MG capsule TAKE 1 CAPSULE BY MOUTH TWICE A DAY 180 capsule 1   ondansetron (ZOFRAN) 8 MG tablet Take 1 tablet (8 mg total) by mouth every 8 (eight) hours as needed for nausea or vomiting. 20 tablet 0   predniSONE (DELTASONE) 20 MG tablet Take daily with food. Start with 60mg  (3 pills) x 2 days, then reduce to 40mg  (2 pills) x 2 days, then 20mg  (1 pill) x 3 days 13 tablet 0   prochlorperazine (COMPAZINE) 10 MG tablet Take 1 tablet (10 mg total) by mouth every 6 (six) hours as needed for nausea or vomiting. 20 tablet 1   ribociclib succ (KISQALI, 600 MG DOSE,) 200 MG Therapy Pack Take 3 tablets (600 mg total) by mouth daily. Take for 21 days on, 7 days off, repeat every 28 days. 63 tablet 5   sodium chloride (OCEAN) 0.65 % SOLN nasal spray Place 1 spray into both nostrils as needed for congestion.     traZODone (DESYREL) 50 MG tablet TAKE 1 TABLET (50 MG TOTAL) BY MOUTH AT BEDTIME AS NEEDED. FOR SLEEP 90 tablet 1   venlafaxine XR (EFFEXOR-XR) 75 MG 24 hr capsule Take 1 capsule (75 mg total) by mouth daily with breakfast. 360 capsule 0   vitamin B-12 (CYANOCOBALAMIN) 1000 MCG tablet Take 2,500 mcg by mouth every Monday, Wednesday, and Friday.     Vitamin D, Cholecalciferol, 50 MCG (2000 UT) CAPS Take 2,000 Units by mouth every Monday, Wednesday, and Friday.     No current facility-administered medications for this visit.   Facility-Administered Medications Ordered in Other Visits  Medication Dose Route Frequency Provider Last Rate Last Admin   fulvestrant (FASLODEX) injection 500 mg  500 mg Intramuscular Once Jeralyn Ruths, MD        OBJECTIVE: BP (!) 118/48 (BP Location: Left Arm, Patient Position: Sitting, Cuff Size: Large)   Pulse 66    Temp 97.9 F (36.6 C) (Tympanic)   Wt 195 lb (88.5 kg)   LMP 12/22/1999 (Approximate)   SpO2 98%   BMI 32.45 kg/m    Body mass index is 32.45 kg/m.    ECOG FS:0 - Asymptomatic  General: Well-developed, well-nourished, no acute distress. Eyes: Pink conjunctiva, anicteric sclera. HEENT: Normocephalic, moist mucous membranes. Lungs: No audible wheezing or coughing. Heart: Regular rate and rhythm. Abdomen: Soft, nontender, no obvious distention. Musculoskeletal: No edema, cyanosis, or clubbing. Neuro: Alert, answering all questions appropriately. Cranial nerves grossly intact. Skin: No rashes or petechiae noted. Psych: Normal affect.  LAB RESULTS:  Appointment on 01/06/2024  Component Date Value Ref Range Status   WBC 01/06/2024 6.7  4.0 - 10.5 K/uL Final   RBC 01/06/2024 2.67 (L)  3.87 - 5.11 MIL/uL Final   Hemoglobin 01/06/2024 9.6 (L)  12.0 - 15.0 g/dL Final   HCT 16/08/9603 28.8 (L)  36.0 - 46.0 % Final   MCV 01/06/2024 107.9 (H)  80.0 - 100.0 fL Final   MCH 01/06/2024 36.0 (H)  26.0 - 34.0 pg Final   MCHC 01/06/2024 33.3  30.0 - 36.0 g/dL Final   RDW 54/07/8118 14.3  11.5 - 15.5 % Final   Platelets 01/06/2024 199  150 - 400 K/uL Final   nRBC 01/06/2024 0.5 (H)  0.0 - 0.2 % Final   Neutrophils Relative % 01/06/2024 57  % Final   Neutro Abs 01/06/2024 3.8  1.7 - 7.7 K/uL Final   Lymphocytes Relative 01/06/2024 24  % Final  Lymphs Abs 01/06/2024 1.6  0.7 - 4.0 K/uL Final   Monocytes Relative 01/06/2024 12  % Final   Monocytes Absolute 01/06/2024 0.8  0.1 - 1.0 K/uL Final   Eosinophils Relative 01/06/2024 1  % Final   Eosinophils Absolute 01/06/2024 0.1  0.0 - 0.5 K/uL Final   Basophils Relative 01/06/2024 1  % Final   Basophils Absolute 01/06/2024 0.1  0.0 - 0.1 K/uL Final   Immature Granulocytes 01/06/2024 5  % Final   Abs Immature Granulocytes 01/06/2024 0.36 (H)  0.00 - 0.07 K/uL Final   Performed at [facility], [address] [place], [place] [address]     STUDIES: No results found.  ONCOLOGY HISTORY:  Bilateral adenocarcinoma of the breast. Patient is status post bilateral mastectomy in February 2015. She also received adjuvant chemotherapy with Adriamycin, Cytoxan, and Taxol completing treatment in approximately October 2015. She then initiated letrozole, but could not tolerate secondary to leg pain and was switched to Aromasin.  Patient then switched to tamoxifen in October 2019 secondary to cost.  Patient subsequently discontinued tamoxifen and did not complete the recommended 5 years of treatment.   ASSESSMENT: Recurrent, metastatic ER/PR, HER2 negative invasive carcinoma of the breast.   PLAN:    Recurrent, metastatic ER/PR, HER2 negative invasive carcinoma of the breast: See oncology history as above.  Biopsy confirmed recurrent disease.  PET scan results from December 26, 2021 reviewed independently with hypermetabolic left supraclavicular, subpectoral, and axillary lymphadenopathy consistent with nodal recurrence of patient's history of breast cancer.  No distant metastases were identified.  Her most recent PET scan on December 01, 2023 reviewed independently without evidence of recurrent or metastatic disease.  Her initial CA 27-29 initially was 260.1.  Now seems to have plateaued between 74.3 and 98.5.  Most recent total 74.3, today's result is pending.  Of note, patient also had metastatic breast cancer in her surgical specimen from her oophorectomy.  Continue Kisqali as prescribed.  Proceed with fulvestrant today.  Return to clinic in 4 weeks for laboratory work, evaluation by clinical pharmacy, and continuation of treatment.  Patient will then return to clinic in 8 weeks for further evaluation and continuation of treatment.  Appreciate clinical pharmacy input.   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  Follow-up with gynecology oncology as scheduled. Multiple sclerosis: Chronic and unchanged.  Continue evaluation  and treatment per neurology.  Recent MRI of the brain did not reveal any new lesions. Renal insufficiency: GFR is 39 today.  Kisqali may need to be dose reduced if GFR remains persistently less than 30. Anemia: Chronic and unchanged.  Patient's hemoglobin is 9.6.  Monitor. Macrocytosis: Likely secondary to underlying treatments. Reflux: Continue omeprazole.    Patient expressed understanding and was in agreement with this plan. She also understands that She can call clinic at any time with any questions, concerns, or complaints.   Breast cancer bilateral, multifocal ER/PR pos, Her 2 neg.   Staging form: Breast, AJCC 7th Edition     Clinical: Stage IIB (T2, N1, M0) - Signed by Johney Maine, MD on 04/22/2015   Jeralyn Ruths, MD   01/06/2024 10:25 AM

## 2024-01-06 NOTE — Progress Notes (Signed)
Oral Chemotherapy Clinic [facility]  [phone] [phone]  Patient Care Team: Smitty Cords, DO as PCP - General (Family Medicine) Iran Ouch, MD as PCP - Cardiology (Cardiology) Drema Dallas, DO as Consulting Physician (Neurology) Jeralyn Ruths, MD as Consulting Physician (Oncology) Benita Gutter, RN as Oncology Nurse Navigator Isla Pence, [place] (Optometry)   Name of the patient: Cassidy Norris  [id number]  Apr 18, 1951   Date of visit: 01/06/24  HPI: Patient is a 73 y.o. female with recurrent breast cancer, ER/PR positive/HER2 negative. Currently treating with Kisqali (ribociclib) and fulvestrant. She started ribociclib on 01/08/22. Her treatment was held for surgery which was completed on 08/04/23. Patient resumed her ribociclib on 08/19/23.  Reason for Consult: Oral chemotherapy follow-up for ribociclib therapy.   PAST MEDICAL HISTORY: Past Medical History:  Diagnosis Date   Anemia    Arthritis not really diagnosis but have pain   B12 deficiency 07/23/2016   Will check levels to determine if injections are needed again. Await results. Recent CBC at Onc WNL   Blood transfusion without reported diagnosis 1983 had a miscarriage/dnc   Cancer (HCC) 12/30/2013   Multifocal disease: pT1c,m, N0(isolated tumor cells) Er/ PR positive, Her 2 negative.   Cancer (HCC) 01/09/2014   Invasive lobular carcinoma, 2.2 cm, T2,N1 ER/PR positive, HER-2/neu negative   Cataract 2014?   Centrilobular emphysema (HCC) 10/27/2021   CKD (chronic kidney disease), stage IV (HCC) 07/13/2023   Complication of anesthesia 03/06/2022   Prolonged metabolism of Rocuronium during case.   Diabetes mellitus without complication (HCC)    Diffuse cystic mastopathy    Erosive esophagitis    Essential hypertension, benign 09/24/2017   Family history of malignant neoplasm of gastrointestinal tract 2012   Fractured elbow 08/28/2014    Gastroesophageal reflux disease    Hx of metabolic acidosis with increased anion gap    Increased heart rate    Multiple sclerosis (HCC)    Neuromuscular disorder (HCC)    neuropathy in bil feet - left is worse.   Obesity, unspecified    Peripheral neuropathy    Personal history of tobacco use, presenting hazards to health    Restless leg syndrome    Sleep apnea    uses CPAP   Special screening for malignant neoplasms, colon    Squamous cell carcinoma of skin 08/24/2022   SCCIS - scalp, ED&C   Squamous cell carcinoma of skin 07/20/2023   right of midline ant scalp post, tx with ED&C   Squamous cell carcinoma of skin 07/20/2023   right of midline ant scalp ant, tx with [facility]    HEMATOLOGY/ONCOLOGY HISTORY:  Oncology History Overview Note  1. Bilateral carcinoma of breast, February, 2015. Right breast status post radical mastectomy. T1 C. N0M0 (isolated tumor cells in one lymph node) multifocal invasive cancer. Stage IC, Left breast, Status post radical mastectomy, T2, N1, M0 tumor. Stage II, RIght breast. Both tumors are estrogen receptor positive.  Progesterone receptor positive.  HER-2 receptor negative by FISH. Negative for BRCA mutation.  2. Started on Adriamycin and Cytoxan on March 01, 2014. Last cycle of Cytoxan and Adriamycin on May 01, 2014. Patient has finished last chemotherapy on September 9. (12 th dose was omitted because of neuropathy) 3.  Taking tamoxifen.  Patient had a poor tolerance to letrozole.(October, 2015) 4.  Patient is taking letrozole since November of 2015 5.  May, 2016 Letrozole has been put on hold because of bony pains 6.  Started  on Aromasin from July of 2016    History of bilateral breast cancer (Resolved)  11/29/2013 Initial Diagnosis   Breast cancer bilateral, multifocal ER/PR pos, Her 2 neg,.     ALLERGIES:  has no known allergies.  MEDICATIONS:  Current Outpatient Medications  Medication Sig Dispense Refill   acetaminophen (TYLENOL) 500  MG tablet Take 1,000 mg by mouth 2 (two) times daily as needed for moderate pain or headache.     albuterol (VENTOLIN HFA) 108 (90 Base) MCG/ACT inhaler Inhale 2 puffs into the lungs every 4 (four) hours as needed for wheezing or shortness of breath. 8 g 2   amoxicillin-clavulanate (AUGMENTIN) 875-125 MG tablet Take 1 tablet by mouth 2 (two) times daily. 20 tablet 0   baclofen (LIORESAL) 10 MG tablet Take 1 tablet (10 mg total) by mouth 3 (three) times daily as needed for muscle spasms. 270 tablet 1   calcium carbonate (OSCAL) 1500 (600 Ca) MG TABS tablet Take by mouth 2 (two) times daily with a meal.     dapagliflozin propanediol (FARXIGA) 10 MG TABS tablet Take 10 mg by mouth daily.     diclofenac Sodium (VOLTAREN) 1 % GEL Apply 2 g topically 3 (three) times daily as needed. 100 g 2   ezetimibe (ZETIA) 10 MG tablet Take 1 tablet (10 mg total) by mouth daily. 90 tablet 3   fluticasone (FLONASE) 50 MCG/ACT nasal spray PLACE 2 SPRAYS INTO BOTH NOSTRILS AT BEDTIME. 48 g 1   fulvestrant (FASLODEX) 250 MG/5ML injection Inject 500 mg into the muscle every 30 (thirty) days. One injection each buttock over 1-2 minutes. Warm prior to use.     Krill Oil 1000 MG CAPS Take 1,000 mg by mouth daily.     lisinopril (ZESTRIL) 5 MG tablet Take 1 tablet (5 mg total) by mouth daily. 90 tablet 3   loperamide (IMODIUM A-D) 2 MG tablet Take 2 tablets (4 mg total) by mouth 4 (four) times daily as needed for diarrhea or loose stools. 30 tablet 0   metFORMIN (GLUCOPHAGE) 500 MG tablet Take 1 tablet (500 mg total) by mouth 2 (two) times daily with a meal. 180 tablet 3   metoprolol tartrate (LOPRESSOR) 25 MG tablet TAKE 0.5 TABLETS BY MOUTH 2 TIMES DAILY. 90 tablet 0   omeprazole (PRILOSEC) 40 MG capsule TAKE 1 CAPSULE BY MOUTH TWICE A DAY 180 capsule 1   ondansetron (ZOFRAN) 8 MG tablet Take 1 tablet (8 mg total) by mouth every 8 (eight) hours as needed for nausea or vomiting. 20 tablet 0   predniSONE (DELTASONE) 20 MG  tablet Take daily with food. Start with 60mg  (3 pills) x 2 days, then reduce to 40mg  (2 pills) x 2 days, then 20mg  (1 pill) x 3 days 13 tablet 0   prochlorperazine (COMPAZINE) 10 MG tablet Take 1 tablet (10 mg total) by mouth every 6 (six) hours as needed for nausea or vomiting. 20 tablet 1   ribociclib succ (KISQALI, 600 MG DOSE,) 200 MG Therapy Pack Take 3 tablets (600 mg total) by mouth daily. Take for 21 days on, 7 days off, repeat every 28 days. 63 tablet 5   sodium chloride (OCEAN) 0.65 % SOLN nasal spray Place 1 spray into both nostrils as needed for congestion.     traZODone (DESYREL) 50 MG tablet TAKE 1 TABLET (50 MG TOTAL) BY MOUTH AT BEDTIME AS NEEDED. FOR SLEEP 90 tablet 1   venlafaxine XR (EFFEXOR-XR) 75 MG 24 hr capsule Take 1 capsule (75  mg total) by mouth daily with breakfast. 360 capsule 0   vitamin B-12 (CYANOCOBALAMIN) 1000 MCG tablet Take 2,500 mcg by mouth every Monday, Wednesday, and Friday.     Vitamin D, Cholecalciferol, 50 MCG (2000 UT) CAPS Take 2,000 Units by mouth every Monday, Wednesday, and Friday.     No current facility-administered medications for this visit.   Facility-Administered Medications Ordered in Other Visits  Medication Dose Route Frequency Provider Last Rate Last Admin   fulvestrant (FASLODEX) injection 500 mg  500 mg Intramuscular Once Jeralyn Ruths, MD        VITAL SIGNS: LMP 12/22/1999 (Approximate)  There were no vitals filed for this visit.    Estimated body mass index is 32.45 kg/m as calculated from the following:   Height as of 12/31/23: 5\' 5"  (1.651 m).   Weight as of an earlier encounter on 01/06/24: 88.5 kg (195 lb).  LABS: CBC:    Component Value Date/Time   WBC 6.7 01/06/2024 0953   HGB 9.6 (L) 01/06/2024 0953   HGB 9.1 (L) 02/18/2023 0944   HGB 15.5 07/13/2017 0937   HCT 28.8 (L) 01/06/2024 0953   HCT 46.7 (H) 07/13/2017 0937   PLT 199 01/06/2024 0953   PLT 211 02/18/2023 0944   PLT 211 07/13/2017 0937   MCV 107.9 (H)  01/06/2024 0953   MCV 94 07/13/2017 0937   MCV 93 03/11/2015 1356   NEUTROABS 3.8 01/06/2024 0953   NEUTROABS 5.4 07/13/2017 0937   NEUTROABS 5.6 03/11/2015 1356   LYMPHSABS 1.6 01/06/2024 0953   LYMPHSABS 2.1 07/13/2017 0937   LYMPHSABS 2.3 03/11/2015 1356   MONOABS 0.8 01/06/2024 0953   MONOABS 1.0 (H) 03/11/2015 1356   EOSABS 0.1 01/06/2024 0953   EOSABS 0.2 07/13/2017 0937   EOSABS 0.3 03/11/2015 1356   BASOSABS 0.1 01/06/2024 0953   BASOSABS 0.0 07/13/2017 0937   BASOSABS 0.1 03/11/2015 1356   Comprehensive Metabolic Panel:    Component Value Date/Time   NA 141 01/06/2024 0953   NA 142 07/14/2019 1516   NA 139 03/11/2015 1356   K 3.8 01/06/2024 0953   K 3.4 (L) 03/11/2015 1356   CL 106 01/06/2024 0953   CL 103 03/11/2015 1356   CO2 22 01/06/2024 0953   CO2 28 03/11/2015 1356   BUN 37 (H) 01/06/2024 0953   BUN 12 07/14/2019 1516   BUN 13 03/11/2015 1356   CREATININE 1.44 (H) 01/06/2024 0953   CREATININE 1.62 (H) 09/16/2023 0856   CREATININE 0.85 10/20/2021 0838   GLUCOSE 130 (H) 01/06/2024 0953   GLUCOSE 118 (H) 03/11/2015 1356   CALCIUM 8.8 (L) 01/06/2024 0953   CALCIUM 9.2 03/11/2015 1356   AST 22 01/06/2024 0953   AST 28 09/16/2023 0856   ALT 21 01/06/2024 0953   ALT 20 09/16/2023 0856   ALT 32 03/11/2015 1356   ALKPHOS 83 01/06/2024 0953   ALKPHOS 68 03/11/2015 1356   BILITOT 0.5 01/06/2024 0953   BILITOT 0.4 09/16/2023 0856   PROT 7.3 01/06/2024 0953   PROT 6.9 07/14/2019 1516   PROT 6.9 03/11/2015 1356   ALBUMIN 3.6 01/06/2024 0953   ALBUMIN 4.5 07/14/2019 1516   ALBUMIN 4.2 03/11/2015 1356     Present during today's visit: patient only   Assessment and Plan: CBC/CMP reviewed, continue ribociclib 600mg  21on/7 off  Patient will receive fulvestrant today.    Oral Chemotherapy Side Effect/Intolerance:  Patient recently had acute bronchitis/emphysema for which she was prescribed prednisone and augmentin by her  PCP on 12/31/23. She said has helped  her acute symptoms significantly. Since then, she reports her overall health status has been stable and has not experienced any new adverse effects from her ribociclib.   Today, patient endorses some loose stools but that this is unchanged from her baseline. She reports she has been making an effort to drink more fluids and to maintain a balanced diet.  Oral Chemotherapy Adherence: No missed doses in the last cycle reported There are no patient barriers to medication adherence identified.   New medications: None reported  Medication Access Issues: No issues, receives Health and safety inspector and fill at [employer] (Specialty).   Patient expressed understanding and was in agreement with this plan. She also understands that She can call clinic at any time with any questions, concerns, or complaints.   Follow-up plan: RTC in 4 weeks.   Thank you for allowing me to participate in the care of this very pleasant patient.   Time Total: 15 minutes  Visit consisted of counseling and education on dealing with issues of symptom management in the setting of serious and potentially life-threatening illness.Greater than 50%  of this time was spent counseling and coordinating care related to the above assessment and plan.  Signed by: Jerry Caras, PharmD PGY2 Oncology Pharmacy Resident   01/06/2024 10:31 AM

## 2024-01-07 ENCOUNTER — Ambulatory Visit: Payer: Medicare Other | Attending: Cardiovascular Disease | Admitting: Cardiovascular Disease

## 2024-01-07 ENCOUNTER — Encounter: Payer: Self-pay | Admitting: Cardiovascular Disease

## 2024-01-07 VITALS — BP 114/72 | HR 74 | Ht 65.5 in | Wt 195.2 lb

## 2024-01-07 DIAGNOSIS — R000 Tachycardia, unspecified: Secondary | ICD-10-CM | POA: Diagnosis not present

## 2024-01-07 DIAGNOSIS — E1169 Type 2 diabetes mellitus with other specified complication: Secondary | ICD-10-CM | POA: Insufficient documentation

## 2024-01-07 DIAGNOSIS — E785 Hyperlipidemia, unspecified: Secondary | ICD-10-CM | POA: Insufficient documentation

## 2024-01-07 LAB — CANCER ANTIGEN 27.29: CA 27.29: 76.2 U/mL — ABNORMAL HIGH (ref 0.0–38.6)

## 2024-01-07 NOTE — Progress Notes (Signed)
Cardiology Office Note   Date:  01/07/2024   ID:  Cassidy, Norris 04/07/1951, MRN [id number]  PCP:  Smitty Cords, DO  Cardiologist:   Lorine Bears, MD   Chief Complaint  Patient presents with   Follow-up    Follow up after echo and zio monitor. Patient feels well. Medications reviewed.      History of Present Illness: Cassidy Norris is a 73 y.o. female who is here today for a follow-up visit regarding palpitations.    She has a history of breast cancer status post bilateral mastectomy followed by chemotherapy with Adriamycin in 2015, multiple sclerosis, neuropathy, chronic kidney disease and arthritis.  She is a previous smoker. She was seen by me in  2016 for sinus tachycardia with no identifiable cause.  Echocardiogram showed normal LV systolic function with no significant valvular abnormalities.  She was treated with small dose carvedilol at that time. 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 She was seen recently for palpitations and intermittent sinus tachycardia.  No other cardiac symptoms. She underwent an outpatient monitor which showed sinus rhythm with short runs of SVT the longest lasted 14 seconds.  Average heart rate was 96 bpm.  Echocardiogram showed normal LV systolic function with mild mitral regurgitation. She was started on small dose metoprolol 12.5 mg twice daily with improvement in symptoms.   Past Medical History:  Diagnosis Date   Anemia    Arthritis not really diagnosis but have pain   B12 deficiency 07/23/2016   Will check levels to determine if injections are needed again. Await results. Recent CBC at Onc WNL   Blood transfusion without reported diagnosis 1983 had a miscarriage/dnc   Cancer (HCC) 12/30/2013   Multifocal disease: pT1c,m, N0(isolated tumor cells) Er/ PR positive, Her 2 negative.   Cancer (HCC) 01/09/2014   Invasive lobular carcinoma, 2.2 cm,  T2,N1 ER/PR positive, HER-2/neu negative   Cataract 2014?   Centrilobular emphysema (HCC) 10/27/2021   CKD (chronic kidney disease), stage IV (HCC) 07/13/2023   Complication of anesthesia 03/06/2022   Prolonged metabolism of Rocuronium during case.   Diabetes mellitus without complication (HCC)    Diffuse cystic mastopathy    Erosive esophagitis    Essential hypertension, benign 09/24/2017   Family history of malignant neoplasm of gastrointestinal tract 2012   Fractured elbow 08/28/2014   Gastroesophageal reflux disease    Hx of metabolic acidosis with increased anion gap    Increased heart rate    Multiple sclerosis (HCC)    Neuromuscular disorder (HCC)    neuropathy in bil feet - left is worse.   Obesity, unspecified    Peripheral neuropathy    Personal history of tobacco use, presenting hazards to health    Restless leg syndrome    Sleep apnea    uses CPAP   Special screening for malignant neoplasms, colon    Squamous cell carcinoma of skin 08/24/2022   SCCIS - scalp, ED&C   Squamous cell carcinoma of skin 07/20/2023   right of midline ant scalp post, tx with ED&C   Squamous cell carcinoma of skin 07/20/2023   right of midline ant scalp ant, tx with [facility]    Past Surgical History:  Procedure Laterality Date   BREAST BIOPSY Right 1973,2001   BREAST BIOPSY Right 11/07/2013   INVASIVE MAMMARY CARCINOMA , ER/PR positive Her2 negative   BREAST BIOPSY Left 11/27/2013   invasive lobular  and DCIS   BREAST SURGERY Bilateral 01/09/2014   mastectomy   cataract surgery Left 08/13/2022   CHOLECYSTECTOMY     COLONOSCOPY  2010   Dr. Evette Cristal   COLONOSCOPY WITH PROPOFOL N/A 06/11/2015   Procedure: COLONOSCOPY WITH PROPOFOL;  Surgeon: Kieth Brightly, MD;  Location: [facility] ENDOSCOPY;  Service: Endoscopy;  Laterality: N/A;   COLONOSCOPY WITH PROPOFOL N/A 12/23/2021   Procedure: COLONOSCOPY WITH PROPOFOL;  Surgeon: Toney Reil, MD;  Location: [facility] ENDOSCOPY;  Service:  Gastroenterology;  Laterality: N/A;   DILATATION & CURETTAGE/HYSTEROSCOPY WITH MYOSURE N/A 07/12/2018   Procedure: DILATATION & CURETTAGE/HYSTEROSCOPY WITH MYOSURE WITH ENDOMETRIAL POLYPECTOMY;  Surgeon: Conard Novak, MD;  Location: [facility] ORS;  Service: Gynecology;  Laterality: N/A;   DILATION AND CURETTAGE OF UTERUS  1983   ESOPHAGOGASTRODUODENOSCOPY N/A 12/23/2021   Procedure: ESOPHAGOGASTRODUODENOSCOPY (EGD);  Surgeon: Toney Reil, MD;  Location: [facility] ENDOSCOPY;  Service: Gastroenterology;  Laterality: N/A;   JOINT REPLACEMENT Left 11/23/2015   right  2023   KNEE ARTHROPLASTY Right 03/06/2022   Procedure: COMPUTER ASSISTED TOTAL KNEE ARTHROPLASTY;  Surgeon: Donato Heinz, MD;  Location: [facility] ORS;  Service: Orthopedics;  Laterality: Right;   LAPAROSCOPIC BILATERAL SALPINGO OOPHERECTOMY N/A 08/04/2023   Procedure: LAPAROSCOPIC BILATERAL SALPINGO OOPHORECTOMY;  Surgeon: Leida Lauth, MD;  Location: [facility] ORS;  Service: Gynecology;  Laterality: N/A;   POLYPECTOMY  1989   PORTA CATH REMOVAL     PORTACATH PLACEMENT  2015   SKIN BIOPSY     on scalp   SPINE SURGERY  09/14/2014   Ruptured disk L4- L5   TOTAL KNEE ARTHROPLASTY Left 11/22/2015   Procedure: TOTAL KNEE ARTHROPLASTY;  Surgeon: Frederico Hamman, MD;  Location: [facility] OR;  Service: Orthopedics;  Laterality: Left;   TUBAL LIGATION     WISDOM TOOTH EXTRACTION  2006     Current Outpatient Medications  Medication Sig Dispense Refill   acetaminophen (TYLENOL) 500 MG tablet Take 1,000 mg by mouth 2 (two) times daily as needed for moderate pain or headache.     albuterol (VENTOLIN HFA) 108 (90 Base) MCG/ACT inhaler Inhale 2 puffs into the lungs every 4 (four) hours as needed for wheezing or shortness of breath. 8 g 2   amoxicillin-clavulanate (AUGMENTIN) 875-125 MG tablet Take 1 tablet by mouth 2 (two) times daily. 20 tablet 0   baclofen (LIORESAL) 10 MG tablet Take 1 tablet (10 mg total) by mouth 3 (three) times daily as  needed for muscle spasms. 270 tablet 1   calcium carbonate (OSCAL) 1500 (600 Ca) MG TABS tablet Take by mouth 2 (two) times daily with a meal.     dapagliflozin propanediol (FARXIGA) 10 MG TABS tablet Take 10 mg by mouth daily.     diclofenac Sodium (VOLTAREN) 1 % GEL Apply 2 g topically 3 (three) times daily as needed. 100 g 2   ezetimibe (ZETIA) 10 MG tablet Take 1 tablet (10 mg total) by mouth daily. 90 tablet 3   fluticasone (FLONASE) 50 MCG/ACT nasal spray PLACE 2 SPRAYS INTO BOTH NOSTRILS AT BEDTIME. 48 g 1   fulvestrant (FASLODEX) 250 MG/5ML injection Inject 500 mg into the muscle every 30 (thirty) days. One injection each buttock over 1-2 minutes. Warm prior to use.     Krill Oil 1000 MG CAPS Take 1,000 mg by mouth daily.     lisinopril (ZESTRIL) 5 MG tablet Take 1 tablet (5 mg total) by mouth daily. 90 tablet 3   loperamide (IMODIUM A-D) 2 MG tablet Take  2 tablets (4 mg total) by mouth 4 (four) times daily as needed for diarrhea or loose stools. 30 tablet 0   metFORMIN (GLUCOPHAGE) 500 MG tablet Take 1 tablet (500 mg total) by mouth 2 (two) times daily with a meal. 180 tablet 3   metoprolol tartrate (LOPRESSOR) 25 MG tablet TAKE 0.5 TABLETS BY MOUTH 2 TIMES DAILY. 90 tablet 0   omeprazole (PRILOSEC) 40 MG capsule TAKE 1 CAPSULE BY MOUTH TWICE A DAY 180 capsule 1   ondansetron (ZOFRAN) 8 MG tablet Take 1 tablet (8 mg total) by mouth every 8 (eight) hours as needed for nausea or vomiting. 20 tablet 0   prochlorperazine (COMPAZINE) 10 MG tablet Take 1 tablet (10 mg total) by mouth every 6 (six) hours as needed for nausea or vomiting. 20 tablet 1   ribociclib succ (KISQALI, 600 MG DOSE,) 200 MG Therapy Pack Take 3 tablets (600 mg total) by mouth daily. Take for 21 days on, 7 days off, repeat every 28 days. 63 tablet 5   sodium chloride (OCEAN) 0.65 % SOLN nasal spray Place 1 spray into both nostrils as needed for congestion.     traZODone (DESYREL) 50 MG tablet TAKE 1 TABLET (50 MG TOTAL) BY  MOUTH AT BEDTIME AS NEEDED. FOR SLEEP 90 tablet 1   venlafaxine XR (EFFEXOR-XR) 75 MG 24 hr capsule Take 1 capsule (75 mg total) by mouth daily with breakfast. 360 capsule 0   vitamin B-12 (CYANOCOBALAMIN) 1000 MCG tablet Take 2,500 mcg by mouth every Monday, Wednesday, and Friday.     Vitamin D, Cholecalciferol, 50 MCG (2000 UT) CAPS Take 2,000 Units by mouth every Monday, Wednesday, and Friday.     No current facility-administered medications for this visit.    Allergies:   Patient has no known allergies.    Social History:  The patient  reports that she quit smoking about 10 years ago. Her smoking use included cigarettes. She started smoking about 40 years ago. She has a 30 pack-year smoking history. She has quit using smokeless tobacco. She reports that she does not currently use alcohol. She reports that she does not use drugs.   Family History:  The patient's family history includes Breast cancer in her maternal aunt, paternal aunt, and paternal aunt; COPD in her mother; Colon cancer (age of onset: 105) in her father; Diabetes in her maternal grandmother; Hypertension in her sister; Lung cancer (age of onset: 81) in her mother; Pancreatic cancer in her paternal grandfather; Rectal cancer in her cousin, cousin, paternal uncle, and paternal uncle; Stomach cancer (age of onset: 107) in her father.    ROS:  Please see the history of present illness.   Otherwise, review of systems are positive for .   All other systems are reviewed and negative.    PHYSICAL EXAM: VS:  BP 114/72 (BP Location: Left Arm, Patient Position: Sitting, Cuff Size: Normal)   Pulse 74   Ht 5' 5.5" (1.664 m)   Wt 195 lb 3.2 oz (88.5 kg)   LMP 12/22/1999 (Approximate)   SpO2 98%   BMI 31.99 kg/m  , BMI Body mass index is 31.99 kg/m. GEN: Well nourished, well developed, in no acute distress  HEENT: normal  Neck: no JVD, carotid bruits, or masses Cardiac: RRR; no rubs, or gallops,no edema .  1 / 6 systolic murmur in  the aortic area. Respiratory:  clear to auscultation bilaterally, normal work of breathing GI: soft, nontender, nondistended, + BS MS: no deformity or atrophy  Skin: warm and dry, no rash Neuro:  Strength and sensation are intact Psych: euthymic mood, full affect   EKG:  EKG is not ordered today.    Recent Labs: 09/16/2023: Magnesium 2.0 10/25/2023: TSH 1.00 01/06/2024: ALT 21; BUN 37; Creatinine, Ser 1.44; Hemoglobin 9.6; Platelets 199; Potassium 3.8; Sodium 141    Lipid Panel    Component Value Date/Time   CHOL 252 (H) 10/25/2023 0802   CHOL 159 01/12/2017 1329   TRIG 514 (H) 11/29/2023 0839   TRIG 172 (H) 01/12/2017 1329   HDL 44 (L) 10/25/2023 0802   HDL 28 (L) 08/28/2016 1355   CHOLHDL 5.7 (H) 10/25/2023 0802   VLDL 34 (H) 01/12/2017 1329   LDLCALC  10/25/2023 0802     Comment:     . LDL cholesterol not calculated. Triglyceride levels greater than 400 mg/dL invalidate calculated LDL results. . Reference range: <100 . Desirable range <100 mg/dL for primary prevention;   <70 mg/dL for patients with CHD or diabetic patients  with > or = 2 CHD risk factors. Marland Kitchen LDL-C is now calculated using the Martin-Hopkins  calculation, which is a validated novel method providing  better accuracy than the Friedewald equation in the  estimation of LDL-C.  Horald Pollen et al. Lenox Ahr. 1610;960(45): 2061-2068  (http://education.[employer].com/faq/FAQ164)    LDLDIRECT 133 (H) 11/29/2023 0839      Wt Readings from Last 3 Encounters:  01/07/24 195 lb 3.2 oz (88.5 kg)  01/06/24 195 lb (88.5 kg)  12/31/23 195 lb 3.2 oz (88.5 kg)          10/19/2023    2:13 PM  PAD Screen  Previous PAD dx? No  Previous surgical procedure? No  Pain with walking? No  Feet/toe relief with dangling? No  Painful, non-healing ulcers? No  Extremities discolored? No      ASSESSMENT AND PLAN:  1.  Sinus tachycardia and palpitations: She has short runs of SVT.  No serious arrhythmia overall  and her ejection fraction is normal.  She is doing well on small dose metoprolol 12.5 mg twice daily.    2.  Hyperlipidemia: Consider treatment with a statin to achieve a target LDL of less than 70 given that she is diabetic.  Most recent LDL was 133.  She is currently on ezetimibe.  3.  Chronic kidney disease: Most recent creatinine was 1.76 with a GFR of 31.  This has been relatively stable.  She is on small dose lisinopril.  4.  Recurrent metastatic breast cancer.  Currently on chemotherapy.  5.  Multiple sclerosis : Treatment per neurology.    Disposition:   FU with me in 12 months  Signed,  Lorine Bears, MD  01/07/2024 9:32 AM    [facility]

## 2024-01-07 NOTE — Patient Instructions (Addendum)
Medication Instructions:  No changes *If you need a refill on your cardiac medications before your next appointment, please call your pharmacy*   Lab Work: None ordered If you have labs (blood work) drawn today and your tests are completely normal, you will receive your results only by: MyChart Message (if you have MyChart) OR A paper copy in the mail If you have any lab test that is abnormal or we need to change your treatment, we will call you to review the results.   Testing/Procedures: None ordered   Follow-Up: At [facility], you and your health needs are our priority.  As part of our continuing mission to provide you with exceptional heart care, we have created designated Provider Care Teams.  These Care Teams include your primary Cardiologist (physician) and Advanced Practice Providers (APPs -  Physician Assistants and Nurse Practitioners) who all work together to provide you with the care you need, when you need it.  We recommend signing up for the patient portal called "MyChart".  Sign up information is provided on this After Visit Summary.  MyChart is used to connect with patients for Virtual Visits (Telemedicine).  Patients are able to view lab/test results, encounter notes, upcoming appointments, etc.  Non-urgent messages can be sent to your provider as well.   To learn more about what you can do with MyChart, go to [email or web address].    Your next appointment:   12 month(s)  Provider:   You may see Lorine Bears, MD or one of the following Advanced Practice Providers on your designated Care Team:   Nicolasa Ducking, NP Eula Listen, PA-C Cadence Fransico Michael, PA-C Charlsie Quest, NP Carlos Levering, NP

## 2024-01-10 ENCOUNTER — Ambulatory Visit: Payer: Medicare Other | Admitting: Oncology

## 2024-01-10 ENCOUNTER — Ambulatory Visit: Payer: Medicare Other

## 2024-01-10 ENCOUNTER — Other Ambulatory Visit: Payer: Medicare Other

## 2024-01-10 ENCOUNTER — Ambulatory Visit: Payer: Medicare Other | Admitting: Pharmacist

## 2024-01-11 ENCOUNTER — Other Ambulatory Visit: Payer: Self-pay | Admitting: Oncology

## 2024-01-11 ENCOUNTER — Other Ambulatory Visit: Payer: Self-pay | Admitting: Cardiovascular Disease

## 2024-01-13 ENCOUNTER — Other Ambulatory Visit: Payer: Self-pay | Admitting: Oncology

## 2024-01-13 ENCOUNTER — Ambulatory Visit: Payer: Medicare Other | Admitting: Oncology

## 2024-01-13 ENCOUNTER — Ambulatory Visit: Payer: Medicare Other | Admitting: Pharmacist

## 2024-01-13 ENCOUNTER — Ambulatory Visit: Payer: Medicare Other

## 2024-01-13 ENCOUNTER — Other Ambulatory Visit: Payer: Medicare Other

## 2024-01-14 ENCOUNTER — Encounter: Payer: Self-pay | Admitting: Oncology

## 2024-01-25 ENCOUNTER — Other Ambulatory Visit: Payer: Self-pay

## 2024-01-25 ENCOUNTER — Other Ambulatory Visit: Payer: Self-pay | Admitting: Pharmacy Technician

## 2024-01-25 NOTE — Progress Notes (Signed)
 Specialty Pharmacy Refill Coordination Note  Cassidy Norris is a 73 y.o. female contacted today regarding refills of specialty medication(s) Ribociclib Succinate (KISQALI 600mg  Daily Dose)   Patient requested Delivery   Delivery date: 01/28/24   Verified address: [address]  [place]   Medication will be filled on 01/27/24.

## 2024-01-27 ENCOUNTER — Other Ambulatory Visit: Payer: Self-pay

## 2024-02-03 ENCOUNTER — Inpatient Hospital Stay: Payer: Medicare Other | Admitting: Pharmacist

## 2024-02-03 ENCOUNTER — Inpatient Hospital Stay: Payer: Medicare Other

## 2024-02-03 ENCOUNTER — Encounter: Payer: Self-pay | Admitting: Oncology

## 2024-02-03 ENCOUNTER — Ambulatory Visit: Payer: Medicare Other | Admitting: Oncology

## 2024-02-03 ENCOUNTER — Inpatient Hospital Stay: Payer: Medicare Other | Attending: Oncology

## 2024-02-03 DIAGNOSIS — C50919 Malignant neoplasm of unspecified site of unspecified female breast: Secondary | ICD-10-CM

## 2024-02-03 DIAGNOSIS — C778 Secondary and unspecified malignant neoplasm of lymph nodes of multiple regions: Secondary | ICD-10-CM | POA: Diagnosis not present

## 2024-02-03 DIAGNOSIS — Z1721 Progesterone receptor positive status: Secondary | ICD-10-CM | POA: Insufficient documentation

## 2024-02-03 DIAGNOSIS — Z170 Estrogen receptor positive status [ER+]: Secondary | ICD-10-CM | POA: Diagnosis not present

## 2024-02-03 DIAGNOSIS — Z1732 Human epidermal growth factor receptor 2 negative status: Secondary | ICD-10-CM | POA: Insufficient documentation

## 2024-02-03 DIAGNOSIS — Z5111 Encounter for antineoplastic chemotherapy: Secondary | ICD-10-CM | POA: Diagnosis not present

## 2024-02-03 LAB — COMPREHENSIVE METABOLIC PANEL
ALT: 25 U/L (ref 0–44)
AST: 35 U/L (ref 15–41)
Albumin: 4 g/dL (ref 3.5–5.0)
Alkaline Phosphatase: 74 U/L (ref 38–126)
Anion gap: 13 (ref 5–15)
BUN: 30 mg/dL — ABNORMAL HIGH (ref 8–23)
CO2: 24 mmol/L (ref 22–32)
Calcium: 9.5 mg/dL (ref 8.9–10.3)
Chloride: 104 mmol/L (ref 98–111)
Creatinine, Ser: 1.92 mg/dL — ABNORMAL HIGH (ref 0.44–1.00)
GFR, Estimated: 28 mL/min — ABNORMAL LOW (ref >60)
Glucose, Bld: 133 mg/dL — ABNORMAL HIGH (ref 70–99)
Potassium: 4.7 mmol/L (ref 3.5–5.1)
Sodium: 141 mmol/L (ref 135–145)
Total Bilirubin: 0.4 mg/dL (ref 0.0–1.2)
Total Protein: 7.7 g/dL (ref 6.5–8.1)

## 2024-02-03 LAB — CBC WITH DIFFERENTIAL/PLATELET
Abs Immature Granulocytes: 0.02 10*3/uL (ref 0.00–0.07)
Basophils Absolute: 0.1 10*3/uL (ref 0.0–0.1)
Basophils Relative: 3 %
Eosinophils Absolute: 0.1 10*3/uL (ref 0.0–0.5)
Eosinophils Relative: 3 %
HCT: 32 % — ABNORMAL LOW (ref 36.0–46.0)
Hemoglobin: 10.5 g/dL — ABNORMAL LOW (ref 12.0–15.0)
Immature Granulocytes: 1 %
Lymphocytes Relative: 36 %
Lymphs Abs: 1.6 10*3/uL (ref 0.7–4.0)
MCH: 36.6 pg — ABNORMAL HIGH (ref 26.0–34.0)
MCHC: 32.8 g/dL (ref 30.0–36.0)
MCV: 111.5 fL — ABNORMAL HIGH (ref 80.0–100.0)
Monocytes Absolute: 1 10*3/uL (ref 0.1–1.0)
Monocytes Relative: 22 %
Neutro Abs: 1.6 10*3/uL — ABNORMAL LOW (ref 1.7–7.7)
Neutrophils Relative %: 35 %
Platelets: 225 10*3/uL (ref 150–400)
RBC: 2.87 MIL/uL — ABNORMAL LOW (ref 3.87–5.11)
RDW: 14.9 % (ref 11.5–15.5)
WBC: 4.3 10*3/uL (ref 4.0–10.5)
nRBC: 0 % (ref 0.0–0.2)

## 2024-02-03 MED ORDER — FULVESTRANT 250 MG/5ML IM SOSY
500.0000 mg | PREFILLED_SYRINGE | Freq: Once | INTRAMUSCULAR | Status: AC
  Administered 2024-02-03: 500 mg via INTRAMUSCULAR
  Filled 2024-02-03: qty 10

## 2024-02-03 NOTE — Progress Notes (Signed)
 Oral Chemotherapy Clinic [facility]  [phone] [phone]  Patient Care Team: Smitty Cords, DO as PCP - General (Family Medicine) Iran Ouch, MD as PCP - Cardiology (Cardiology) Drema Dallas, DO as Consulting Physician (Neurology) Jeralyn Ruths, MD as Consulting Physician (Oncology) Benita Gutter, RN as Oncology Nurse Navigator Isla Pence, [place] (Optometry)   Name of the patient: Cassidy Norris  [id number]  07/16/1951   Date of visit: 02/03/24  HPI: Patient is a 73 y.o. female with recurrent breast cancer, ER/PR positive/HER2 negative. Currently treating with Kisqali (ribociclib) and fulvestrant. She started ribociclib on 01/08/22. Her treatment was held for surgery which was completed on 08/04/23. Patient resumed her ribociclib on 08/19/23.  Reason for Consult: Oral chemotherapy follow-up for ribociclib therapy.   PAST MEDICAL HISTORY: Past Medical History:  Diagnosis Date   Anemia    Arthritis not really diagnosis but have pain   B12 deficiency 07/23/2016   Will check levels to determine if injections are needed again. Await results. Recent CBC at [facility]   Blood transfusion without reported diagnosis 1983 had a miscarriage/dnc   Cancer (HCC) 12/30/2013   Multifocal disease: pT1c,m, N0(isolated tumor cells) Er/ PR positive, Her 2 negative.   Cancer (HCC) 01/09/2014   Invasive lobular carcinoma, 2.2 cm, T2,N1 ER/PR positive, HER-2/neu negative   Cataract 2014?   Centrilobular emphysema (HCC) 10/27/2021   CKD (chronic kidney disease), stage IV (HCC) 07/13/2023   Complication of anesthesia 03/06/2022   Prolonged metabolism of Rocuronium during case.   Diabetes mellitus without complication (HCC)    Diffuse cystic mastopathy    Erosive esophagitis    Essential hypertension, benign 09/24/2017   Family history of malignant neoplasm of gastrointestinal tract 2012   Fractured elbow 08/28/2014    Gastroesophageal reflux disease    Hx of metabolic acidosis with increased anion gap    Increased heart rate    Multiple sclerosis (HCC)    Neuromuscular disorder (HCC)    neuropathy in bil feet - left is worse.   Obesity, unspecified    Peripheral neuropathy    Personal history of tobacco use, presenting hazards to health    Restless leg syndrome    Sleep apnea    uses CPAP   Special screening for malignant neoplasms, colon    Squamous cell carcinoma of skin 08/24/2022   SCCIS - scalp, ED&C   Squamous cell carcinoma of skin 07/20/2023   right of midline ant scalp post, tx with ED&C   Squamous cell carcinoma of skin 07/20/2023   right of midline ant scalp ant, tx with [facility]    HEMATOLOGY/ONCOLOGY HISTORY:  Oncology History Overview Note  1. Bilateral carcinoma of breast, February, 2015. Right breast status post radical mastectomy. T1 C. N0M0 (isolated tumor cells in one lymph node) multifocal invasive cancer. Stage IC, Left breast, Status post radical mastectomy, T2, N1, M0 tumor. Stage II, RIght breast. Both tumors are estrogen receptor positive.  Progesterone receptor positive.  HER-2 receptor negative by FISH. Negative for BRCA mutation.  2. Started on Adriamycin and Cytoxan on March 01, 2014. Last cycle of Cytoxan and Adriamycin on May 01, 2014. Patient has finished last chemotherapy on September 9. (12 th dose was omitted because of neuropathy) 3.  Taking tamoxifen.  Patient had a poor tolerance to letrozole.(October, 2015) 4.  Patient is taking letrozole since November of 2015 5.  May, 2016 Letrozole has been put on hold because of bony pains 6.  Started  on Aromasin from July of 2016    History of bilateral breast cancer (Resolved)  11/29/2013 Initial Diagnosis   Breast cancer bilateral, multifocal ER/PR pos, Her 2 neg,.     ALLERGIES:  has no known allergies.  MEDICATIONS:  Current Outpatient Medications  Medication Sig Dispense Refill   acetaminophen (TYLENOL) 500  MG tablet Take 1,000 mg by mouth 2 (two) times daily as needed for moderate pain or headache.     albuterol (VENTOLIN HFA) 108 (90 Base) MCG/ACT inhaler Inhale 2 puffs into the lungs every 4 (four) hours as needed for wheezing or shortness of breath. 8 g 2   amoxicillin-clavulanate (AUGMENTIN) 875-125 MG tablet Take 1 tablet by mouth 2 (two) times daily. 20 tablet 0   baclofen (LIORESAL) 10 MG tablet Take 1 tablet (10 mg total) by mouth 3 (three) times daily as needed for muscle spasms. 270 tablet 1   calcium carbonate (OSCAL) 1500 (600 Ca) MG TABS tablet Take by mouth 2 (two) times daily with a meal.     dapagliflozin propanediol (FARXIGA) 10 MG TABS tablet Take 10 mg by mouth daily.     diclofenac Sodium (VOLTAREN) 1 % GEL Apply 2 g topically 3 (three) times daily as needed. 100 g 2   ezetimibe (ZETIA) 10 MG tablet Take 1 tablet (10 mg total) by mouth daily. 90 tablet 3   fluticasone (FLONASE) 50 MCG/ACT nasal spray PLACE 2 SPRAYS INTO BOTH NOSTRILS AT BEDTIME. 48 g 1   fulvestrant (FASLODEX) 250 MG/5ML injection Inject 500 mg into the muscle every 30 (thirty) days. One injection each buttock over 1-2 minutes. Warm prior to use.     Krill Oil 1000 MG CAPS Take 1,000 mg by mouth daily.     lisinopril (ZESTRIL) 5 MG tablet Take 1 tablet (5 mg total) by mouth daily. 90 tablet 3   loperamide (IMODIUM A-D) 2 MG tablet Take 2 tablets (4 mg total) by mouth 4 (four) times daily as needed for diarrhea or loose stools. 30 tablet 0   loperamide (IMODIUM) 2 MG capsule TAKE 2 TABLETS (4 MG TOTAL) BY MOUTH 4 (FOUR) TIMES DAILY AS NEEDED FOR DIARRHEA OR LOOSE STOOLS. 30 capsule 1   metFORMIN (GLUCOPHAGE) 500 MG tablet Take 1 tablet (500 mg total) by mouth 2 (two) times daily with a meal. 180 tablet 3   metoprolol tartrate (LOPRESSOR) 25 MG tablet TAKE 1/2 TABLET TWICE A DAY BY MOUTH 90 tablet 3   omeprazole (PRILOSEC) 40 MG capsule TAKE 1 CAPSULE BY MOUTH TWICE A DAY 180 capsule 1   ondansetron (ZOFRAN) 8 MG  tablet Take 1 tablet (8 mg total) by mouth every 8 (eight) hours as needed for nausea or vomiting. 20 tablet 0   prochlorperazine (COMPAZINE) 10 MG tablet Take 1 tablet (10 mg total) by mouth every 6 (six) hours as needed for nausea or vomiting. 20 tablet 1   ribociclib succ (KISQALI, 600 MG DOSE,) 200 MG Therapy Pack Take 3 tablets (600 mg total) by mouth daily. Take for 21 days on, 7 days off, repeat every 28 days. 63 tablet 5   sodium chloride (OCEAN) 0.65 % SOLN nasal spray Place 1 spray into both nostrils as needed for congestion.     traZODone (DESYREL) 50 MG tablet TAKE 1 TABLET (50 MG TOTAL) BY MOUTH AT BEDTIME AS NEEDED. FOR SLEEP 90 tablet 1   venlafaxine XR (EFFEXOR-XR) 75 MG 24 hr capsule Take 1 capsule (75 mg total) by mouth daily with breakfast. 360 capsule  0   vitamin B-12 (CYANOCOBALAMIN) 1000 MCG tablet Take 2,500 mcg by mouth every Monday, Wednesday, and Friday.     Vitamin D, Cholecalciferol, 50 MCG (2000 UT) CAPS Take 2,000 Units by mouth every Monday, Wednesday, and Friday.     No current facility-administered medications for this visit.   Facility-Administered Medications Ordered in Other Visits  Medication Dose Route Frequency Provider Last Rate Last Admin   fulvestrant (FASLODEX) injection 500 mg  500 mg Intramuscular Once Jeralyn Ruths, MD        VITAL SIGNS: LMP 12/22/1999 (Approximate)  There were no vitals filed for this visit.    Estimated body mass index is 31.99 kg/m as calculated from the following:   Height as of 01/07/24: 5' 5.5" (1.664 m).   Weight as of 01/07/24: 88.5 kg (195 lb 3.2 oz).  LABS: CBC:    Component Value Date/Time   WBC 6.7 01/06/2024 0953   HGB 9.6 (L) 01/06/2024 0953   HGB 9.1 (L) 02/18/2023 0944   HGB 15.5 07/13/2017 0937   HCT 28.8 (L) 01/06/2024 0953   HCT 46.7 (H) 07/13/2017 0937   PLT 199 01/06/2024 0953   PLT 211 02/18/2023 0944   PLT 211 07/13/2017 0937   MCV 107.9 (H) 01/06/2024 0953   MCV 94 07/13/2017 0937    MCV 93 03/11/2015 1356   NEUTROABS 3.8 01/06/2024 0953   NEUTROABS 5.4 07/13/2017 0937   NEUTROABS 5.6 03/11/2015 1356   LYMPHSABS 1.6 01/06/2024 0953   LYMPHSABS 2.1 07/13/2017 0937   LYMPHSABS 2.3 03/11/2015 1356   MONOABS 0.8 01/06/2024 0953   MONOABS 1.0 (H) 03/11/2015 1356   EOSABS 0.1 01/06/2024 0953   EOSABS 0.2 07/13/2017 0937   EOSABS 0.3 03/11/2015 1356   BASOSABS 0.1 01/06/2024 0953   BASOSABS 0.0 07/13/2017 0937   BASOSABS 0.1 03/11/2015 1356   Comprehensive Metabolic Panel:    Component Value Date/Time   NA 141 01/06/2024 0953   NA 142 07/14/2019 1516   NA 139 03/11/2015 1356   K 3.8 01/06/2024 0953   K 3.4 (L) 03/11/2015 1356   CL 106 01/06/2024 0953   CL 103 03/11/2015 1356   CO2 22 01/06/2024 0953   CO2 28 03/11/2015 1356   BUN 37 (H) 01/06/2024 0953   BUN 12 07/14/2019 1516   BUN 13 03/11/2015 1356   CREATININE 1.44 (H) 01/06/2024 0953   CREATININE 1.62 (H) 09/16/2023 0856   CREATININE 0.85 10/20/2021 0838   GLUCOSE 130 (H) 01/06/2024 0953   GLUCOSE 118 (H) 03/11/2015 1356   CALCIUM 8.8 (L) 01/06/2024 0953   CALCIUM 9.2 03/11/2015 1356   AST 22 01/06/2024 0953   AST 28 09/16/2023 0856   ALT 21 01/06/2024 0953   ALT 20 09/16/2023 0856   ALT 32 03/11/2015 1356   ALKPHOS 83 01/06/2024 0953   ALKPHOS 68 03/11/2015 1356   BILITOT 0.5 01/06/2024 0953   BILITOT 0.4 09/16/2023 0856   PROT 7.3 01/06/2024 0953   PROT 6.9 07/14/2019 1516   PROT 6.9 03/11/2015 1356   ALBUMIN 3.6 01/06/2024 0953   ALBUMIN 4.5 07/14/2019 1516   ALBUMIN 4.2 03/11/2015 1356     Present during today's visit: patient only   Assessment and Plan: CBC/CMP reviewed, continue ribociclib 600mg  21on/7 off  Continue to monitor renal function Patient will receive fulvestrant today    Oral Chemotherapy Side Effect/Intolerance:  Fatigue: Patient reports feeling tired and sleeping a lot during the day, but she feels better when she has more things  scheduled and is more activity.  Encouraged patient to set-up more of a schedule for herself and taking advance of this warmer weather Constipation: suggested patient try docusate or miralax   Oral Chemotherapy Adherence: No missed doses reported this month There are no patient barriers to medication adherence identified.   New medications: None reported  Medication Access Issues: No issues, receives Health and safety inspector and fill at [employer] (Specialty).   Patient expressed understanding and was in agreement with this plan. She also understands that She can call clinic at any time with any questions, concerns, or complaints.   Follow-up plan: RTC in 4 weeks.   Thank you for allowing me to participate in the care of this very pleasant patient.   Time Total: 15 minutes  Visit consisted of counseling and education on dealing with issues of symptom management in the setting of serious and potentially life-threatening illness.Greater than 50%  of this time was spent counseling and coordinating care related to the above assessment and plan.  Signed by: Remi Haggard, PharmD, Nolon Bussing, CPP Hematology/Oncology Clinical Pharmacist Practitioner [place] [phone]  02/03/2024 9:56 AM

## 2024-02-04 LAB — CANCER ANTIGEN 27.29: CA 27.29: 81.8 U/mL — ABNORMAL HIGH (ref 0.0–38.6)

## 2024-02-09 ENCOUNTER — Encounter: Payer: Self-pay | Admitting: Dermatology

## 2024-02-09 ENCOUNTER — Ambulatory Visit (INDEPENDENT_AMBULATORY_CARE_PROVIDER_SITE_OTHER): Payer: Medicare Other | Admitting: Dermatology

## 2024-02-09 DIAGNOSIS — W908XXA Exposure to other nonionizing radiation, initial encounter: Secondary | ICD-10-CM | POA: Diagnosis not present

## 2024-02-09 DIAGNOSIS — Z86007 Personal history of in-situ neoplasm of skin: Secondary | ICD-10-CM

## 2024-02-09 DIAGNOSIS — L578 Other skin changes due to chronic exposure to nonionizing radiation: Secondary | ICD-10-CM

## 2024-02-09 DIAGNOSIS — L820 Inflamed seborrheic keratosis: Secondary | ICD-10-CM | POA: Diagnosis not present

## 2024-02-09 DIAGNOSIS — L570 Actinic keratosis: Secondary | ICD-10-CM | POA: Diagnosis not present

## 2024-02-09 DIAGNOSIS — Z8589 Personal history of malignant neoplasm of other organs and systems: Secondary | ICD-10-CM

## 2024-02-09 DIAGNOSIS — L821 Other seborrheic keratosis: Secondary | ICD-10-CM

## 2024-02-09 NOTE — Progress Notes (Signed)
 Follow-Up Visit   Subjective  Cassidy Norris is a 73 y.o. female who presents for the following: AK scalp 53m f/u, ISKs face, hands 1m f/u, Recheck SCC IS x 2 R of midline ant scalp anterior, R of midline ant scalp post bx and EDC 07/20/23 The patient has spots, moles and lesions to be evaluated, some may be new or changing and the patient may have concern these could be cancer.  The following portions of the chart were reviewed this encounter and updated as appropriate: medications, allergies, medical history  Review of Systems:  No other skin or systemic complaints except as noted in HPI or Assessment and Plan.  Objective  Well appearing patient in no apparent distress; mood and affect are within normal limits.  A focused examination was performed of the following areas: Face, scalp, hands  Relevant exam findings are noted in the Assessment and Plan.  face x 10, scalp x 10 (20) Stuck on waxy paps with erythema R ant scalp x 2 (2) Pink scaly macules  Assessment & Plan   ACTINIC DAMAGE - chronic, secondary to cumulative UV radiation exposure/sun exposure over time - diffuse scaly erythematous macules with underlying dyspigmentation - Recommend daily broad spectrum sunscreen SPF 30+ to sun-exposed areas, reapply every 2 hours as needed.  - Recommend staying in the shade or wearing long sleeves, sun glasses (UVA+UVB protection) and wide brim hats (4-inch brim around the entire circumference of the hat). - Call for new or changing lesions.   HISTORY OF SQUAMOUS CELL CARCINOMA IN SITU OF THE SKIN - No evidence of recurrence today - Recommend regular full body skin exams - Recommend daily broad spectrum sunscreen SPF 30+ to sun-exposed areas, reapply every 2 hours as needed.  - Call if any new or changing lesions are noted between office visits  - R of midline ant scalp anterior, R of midline ant scalp posterior - clear today  INFLAMED SEBORRHEIC KERATOSIS (20) face x 10,  scalp x 10 (20) Symptomatic, irritating, patient would like treated. Destruction of lesion - face x 10, scalp x 10 (20) Complexity: simple   Destruction method: cryotherapy   Informed consent: discussed and consent obtained   Timeout:  patient name, date of birth, surgical site, and procedure verified Lesion destroyed using liquid nitrogen: Yes   Region frozen until ice ball extended beyond lesion: Yes   Outcome: patient tolerated procedure well with no complications   Post-procedure details: wound care instructions given   SEBORRHEIC KERATOSIS, INFLAMED (2) R medial lower eyelid margin x 2 (2) Destruction of lesion - R medial lower eyelid margin x 2 (2) Complexity: simple   Destruction method: cryotherapy   Informed consent: discussed and consent obtained   Timeout:  patient name, date of birth, surgical site, and procedure verified Lesion destroyed using liquid nitrogen: Yes   Region frozen until ice ball extended beyond lesion: Yes   Outcome: patient tolerated procedure well with no complications   Post-procedure details: wound care instructions given   AK (ACTINIC KERATOSIS) (2) R ant scalp x 2 (2) Actinic keratoses are precancerous spots that appear secondary to cumulative UV radiation exposure/sun exposure over time. They are chronic with expected duration over 1 year. A portion of actinic keratoses will progress to squamous cell carcinoma of the skin. It is not possible to reliably predict which spots will progress to skin cancer and so treatment is recommended to prevent development of skin cancer.  Recommend daily broad spectrum sunscreen SPF 30+ to  sun-exposed areas, reapply every 2 hours as needed.  Recommend staying in the shade or wearing long sleeves, sun glasses (UVA+UVB protection) and wide brim hats (4-inch brim around the entire circumference of the hat). Call for new or changing lesions. Destruction of lesion - R ant scalp x 2 (2) Complexity: simple   Destruction  method: cryotherapy   Informed consent: discussed and consent obtained   Timeout:  patient name, date of birth, surgical site, and procedure verified Lesion destroyed using liquid nitrogen: Yes   Region frozen until ice ball extended beyond lesion: Yes   Outcome: patient tolerated procedure well with no complications   Post-procedure details: wound care instructions given    Return for 6-8 months TBSE, Hx of SCC IS, Hx of AKs.  I, Ardis Rowan, RMA, am acting as scribe for Armida Sans, MD .   Documentation: I have reviewed the above documentation for accuracy and completeness, and I agree with the above.  Armida Sans, MD

## 2024-02-09 NOTE — Patient Instructions (Addendum)

## 2024-02-14 ENCOUNTER — Other Ambulatory Visit: Payer: Self-pay

## 2024-02-14 DIAGNOSIS — Z87891 Personal history of nicotine dependence: Secondary | ICD-10-CM

## 2024-02-14 DIAGNOSIS — Z122 Encounter for screening for malignant neoplasm of respiratory organs: Secondary | ICD-10-CM

## 2024-02-15 ENCOUNTER — Encounter: Payer: Self-pay | Admitting: Oncology

## 2024-02-15 DIAGNOSIS — N189 Chronic kidney disease, unspecified: Secondary | ICD-10-CM | POA: Diagnosis not present

## 2024-02-15 DIAGNOSIS — I10 Essential (primary) hypertension: Secondary | ICD-10-CM | POA: Diagnosis not present

## 2024-02-15 DIAGNOSIS — R809 Proteinuria, unspecified: Secondary | ICD-10-CM | POA: Diagnosis not present

## 2024-02-15 DIAGNOSIS — N1832 Chronic kidney disease, stage 3b: Secondary | ICD-10-CM | POA: Diagnosis not present

## 2024-02-15 DIAGNOSIS — I129 Hypertensive chronic kidney disease with stage 1 through stage 4 chronic kidney disease, or unspecified chronic kidney disease: Secondary | ICD-10-CM | POA: Diagnosis not present

## 2024-02-21 ENCOUNTER — Ambulatory Visit
Admission: RE | Admit: 2024-02-21 | Discharge: 2024-02-21 | Disposition: A | Discharge status: Home / Self Care | Source: Ambulatory Visit | Attending: Acute Care | Admitting: Acute Care

## 2024-02-21 ENCOUNTER — Other Ambulatory Visit (HOSPITAL_COMMUNITY): Payer: Self-pay

## 2024-02-21 DIAGNOSIS — I10 Essential (primary) hypertension: Secondary | ICD-10-CM | POA: Diagnosis not present

## 2024-02-21 DIAGNOSIS — N1832 Chronic kidney disease, stage 3b: Secondary | ICD-10-CM | POA: Diagnosis not present

## 2024-02-21 DIAGNOSIS — R809 Proteinuria, unspecified: Secondary | ICD-10-CM | POA: Diagnosis not present

## 2024-02-21 DIAGNOSIS — I129 Hypertensive chronic kidney disease with stage 1 through stage 4 chronic kidney disease, or unspecified chronic kidney disease: Secondary | ICD-10-CM | POA: Diagnosis not present

## 2024-02-21 DIAGNOSIS — Z87891 Personal history of nicotine dependence: Secondary | ICD-10-CM | POA: Diagnosis not present

## 2024-02-21 DIAGNOSIS — N189 Chronic kidney disease, unspecified: Secondary | ICD-10-CM | POA: Diagnosis not present

## 2024-02-21 DIAGNOSIS — Z122 Encounter for screening for malignant neoplasm of respiratory organs: Secondary | ICD-10-CM | POA: Insufficient documentation

## 2024-02-21 NOTE — Progress Notes (Signed)
 Specialty Pharmacy Ongoing Clinical Assessment Note  Cassidy Norris is a 73 y.o. female who is being followed by the specialty pharmacy service for RxSp Oncology   Patient's specialty medication(s) reviewed today: Ribociclib Succinate (KISQALI 600mg  Daily Dose)   Missed doses in the last 4 weeks: 2 (2 pills, not doses, pt dropped when trying to open package and couldnt find them)   Patient/Caregiver did not have any additional questions or concerns.   Therapeutic benefit summary: Patient is achieving benefit   Adverse events/side effects summary: Experienced adverse events/side effects (alternating nausea and diarrhea, patient reports it is tolerable at this time)   Patient's therapy is appropriate to: Continue    Goals Addressed             This Visit's Progress    Slow Disease Progression   On track    Patient is initiating therapy. Patient will maintain adherence.  CA 27.29 has fluctuated slightly, but remains stable.          Follow up:  3 months  Servando Snare Specialty Pharmacist

## 2024-02-21 NOTE — Progress Notes (Signed)
 Specialty Pharmacy Refill Coordination Note  Cassidy Norris is a 73 y.o. female contacted today regarding refills of specialty medication(s) Ribociclib Succinate (KISQALI 600mg  Daily Dose)   Patient requested Delivery   Delivery date: 02/25/24   Verified address: [address]  [place]   Medication will be filled on 02/24/24.

## 2024-02-29 ENCOUNTER — Encounter: Payer: Self-pay | Admitting: Family Medicine

## 2024-03-02 ENCOUNTER — Inpatient Hospital Stay: Payer: Medicare Other

## 2024-03-02 ENCOUNTER — Inpatient Hospital Stay: Payer: Medicare Other | Attending: Oncology

## 2024-03-02 ENCOUNTER — Inpatient Hospital Stay (HOSPITAL_BASED_OUTPATIENT_CLINIC_OR_DEPARTMENT_OTHER): Payer: Medicare Other | Admitting: Oncology

## 2024-03-02 ENCOUNTER — Encounter: Payer: Self-pay | Admitting: Oncology

## 2024-03-02 VITALS — BP 121/61 | HR 77 | Temp 97.9°F | Resp 18 | Ht 65.5 in | Wt 203.8 lb

## 2024-03-02 DIAGNOSIS — Z1732 Human epidermal growth factor receptor 2 negative status: Secondary | ICD-10-CM | POA: Diagnosis not present

## 2024-03-02 DIAGNOSIS — C50919 Malignant neoplasm of unspecified site of unspecified female breast: Secondary | ICD-10-CM

## 2024-03-02 DIAGNOSIS — Z170 Estrogen receptor positive status [ER+]: Secondary | ICD-10-CM | POA: Diagnosis not present

## 2024-03-02 DIAGNOSIS — Z79899 Other long term (current) drug therapy: Secondary | ICD-10-CM | POA: Diagnosis not present

## 2024-03-02 DIAGNOSIS — C778 Secondary and unspecified malignant neoplasm of lymph nodes of multiple regions: Secondary | ICD-10-CM | POA: Insufficient documentation

## 2024-03-02 DIAGNOSIS — Z5111 Encounter for antineoplastic chemotherapy: Secondary | ICD-10-CM | POA: Insufficient documentation

## 2024-03-02 DIAGNOSIS — Z1721 Progesterone receptor positive status: Secondary | ICD-10-CM | POA: Insufficient documentation

## 2024-03-02 DIAGNOSIS — Z79811 Long term (current) use of aromatase inhibitors: Secondary | ICD-10-CM | POA: Diagnosis not present

## 2024-03-02 LAB — COMPREHENSIVE METABOLIC PANEL WITH GFR
ALT: 25 U/L (ref 0–44)
AST: 33 U/L (ref 15–41)
Albumin: 3.9 g/dL (ref 3.5–5.0)
Alkaline Phosphatase: 79 U/L (ref 38–126)
Anion gap: 8 (ref 5–15)
BUN: 28 mg/dL — ABNORMAL HIGH (ref 8–23)
CO2: 24 mmol/L (ref 22–32)
Calcium: 9.4 mg/dL (ref 8.9–10.3)
Chloride: 107 mmol/L (ref 98–111)
Creatinine, Ser: 1.8 mg/dL — ABNORMAL HIGH (ref 0.44–1.00)
GFR, Estimated: 30 mL/min — ABNORMAL LOW (ref >60)
Glucose, Bld: 123 mg/dL — ABNORMAL HIGH (ref 70–99)
Potassium: 4.8 mmol/L (ref 3.5–5.1)
Sodium: 139 mmol/L (ref 135–145)
Total Bilirubin: 0.5 mg/dL (ref 0.0–1.2)
Total Protein: 7.4 g/dL (ref 6.5–8.1)

## 2024-03-02 LAB — CBC WITH DIFFERENTIAL/PLATELET
Abs Immature Granulocytes: 0.02 10*3/uL (ref 0.00–0.07)
Basophils Absolute: 0.1 10*3/uL (ref 0.0–0.1)
Basophils Relative: 3 %
Eosinophils Absolute: 0.1 10*3/uL (ref 0.0–0.5)
Eosinophils Relative: 3 %
HCT: 29.9 % — ABNORMAL LOW (ref 36.0–46.0)
Hemoglobin: 10 g/dL — ABNORMAL LOW (ref 12.0–15.0)
Immature Granulocytes: 1 %
Lymphocytes Relative: 32 %
Lymphs Abs: 1.3 10*3/uL (ref 0.7–4.0)
MCH: 37.2 pg — ABNORMAL HIGH (ref 26.0–34.0)
MCHC: 33.4 g/dL (ref 30.0–36.0)
MCV: 111.2 fL — ABNORMAL HIGH (ref 80.0–100.0)
Monocytes Absolute: 0.7 10*3/uL (ref 0.1–1.0)
Monocytes Relative: 18 %
Neutro Abs: 1.8 10*3/uL (ref 1.7–7.7)
Neutrophils Relative %: 43 %
Platelets: 177 10*3/uL (ref 150–400)
RBC: 2.69 MIL/uL — ABNORMAL LOW (ref 3.87–5.11)
RDW: 14.7 % (ref 11.5–15.5)
WBC: 4.1 10*3/uL (ref 4.0–10.5)
nRBC: 0 % (ref 0.0–0.2)

## 2024-03-02 MED ORDER — FULVESTRANT 250 MG/5ML IM SOSY
500.0000 mg | PREFILLED_SYRINGE | Freq: Once | INTRAMUSCULAR | Status: AC
  Administered 2024-03-02: 500 mg via INTRAMUSCULAR
  Filled 2024-03-02: qty 10

## 2024-03-02 NOTE — Progress Notes (Signed)
 [facility]  Telephone:([phone]) [phone]  [phone]) [phone]     Cassidy Norris DOB: 1951-01-10  MR#: [id number]  AOZ#:[id number]  Patient Care Team: Smitty Cords, DO as PCP - General (Family Medicine) Iran Ouch, MD as PCP - Cardiology (Cardiology) Drema Dallas, DO as Consulting Physician (Neurology) Jeralyn Ruths, MD as Consulting Physician (Oncology) Benita Gutter, RN as Oncology Nurse Navigator Isla Pence, OD (Optometry)  CHIEF COMPLAINT: Recurrent, metastatic ER/PR, HER2 negative invasive carcinoma of the breast.    INTERVAL HISTORY: Patient returns to clinic today for further evaluation and continuation of Kisqali and fulvestrant.  She currently feels well and is asymptomatic.  She does not complain of any pain today.  She is tolerating her treatments without significant side effects. She denies any weakness or fatigue.  She has no new neurologic complaints.  She denies any recent fevers or illnesses.  She has a fair appetite and denies weight loss.  She has no chest pain, shortness of breath, cough, or hemoptysis.  She denies any nausea, vomiting, constipation, or diarrhea.  She has no urinary complaints.  Patient offers no specific complaints today.  REVIEW OF SYSTEMS:   Review of Systems  Constitutional: Negative.  Negative for fever, malaise/fatigue and weight loss.  Respiratory: Negative.  Negative for cough, hemoptysis and shortness of breath.   Cardiovascular: Negative.  Negative for chest pain and leg swelling.  Gastrointestinal: Negative.  Negative for abdominal pain.  Genitourinary: Negative.  Negative for dysuria and flank pain.  Musculoskeletal:  Negative for joint pain and myalgias.  Skin: Negative.  Negative for rash.  Neurological: Negative.  Negative for dizziness, sensory change, focal weakness, weakness and headaches.  Psychiatric/Behavioral: Negative.  The patient is not nervous/anxious.     As per HPI.  Otherwise, a complete review of systems is negative.  ONCOLOGY HISTORY: Oncology History Overview Note  1. Bilateral carcinoma of breast, February, 2015. Right breast status post radical mastectomy. T1 C. N0M0 (isolated tumor cells in one lymph node) multifocal invasive cancer. Stage IC, Left breast, Status post radical mastectomy, T2, N1, M0 tumor. Stage II, RIght breast. Both tumors are estrogen receptor positive.  Progesterone receptor positive.  HER-2 receptor negative by FISH. Negative for BRCA mutation.  2. Started on Adriamycin and Cytoxan on March 01, 2014. Last cycle of Cytoxan and Adriamycin on May 01, 2014. Patient has finished last chemotherapy on September 9. (12 th dose was omitted because of neuropathy) 3.  Taking tamoxifen.  Patient had a poor tolerance to letrozole.(October, 2015) 4.  Patient is taking letrozole since November of 2015 5.  May, 2016 Letrozole has been put on hold because of bony pains 6.  Started on Aromasin from July of 2016    History of bilateral breast cancer (Resolved)  11/29/2013 Initial Diagnosis   Breast cancer bilateral, multifocal ER/PR pos, Her 2 neg,.     PAST MEDICAL HISTORY: Past Medical History:  Diagnosis Date   Actinic keratosis    Anemia    Arthritis not really diagnosis but have pain   B12 deficiency 07/23/2016   Will check levels to determine if injections are needed again. Await results. Recent CBC at Onc WNL   Blood transfusion without reported diagnosis 1983 had a miscarriage/dnc   Cancer (HCC) 12/30/2013   Multifocal disease: pT1c,m, N0(isolated tumor cells) Er/ PR positive, Her 2 negative.   Cancer (HCC) 01/09/2014   Invasive lobular carcinoma, 2.2 cm, T2,N1 ER/PR positive, HER-2/neu negative   Cataract 2014?  Centrilobular emphysema (HCC) 10/27/2021   CKD (chronic kidney disease), stage IV (HCC) 07/13/2023   Complication of anesthesia 03/06/2022   Prolonged metabolism of Rocuronium during case.   Diabetes mellitus  without complication (HCC)    Diffuse cystic mastopathy    Erosive esophagitis    Essential hypertension, benign 09/24/2017   Family history of malignant neoplasm of gastrointestinal tract 2012   Fractured elbow 08/28/2014   Gastroesophageal reflux disease    Hx of metabolic acidosis with increased anion gap    Increased heart rate    Multiple sclerosis (HCC)    Neuromuscular disorder (HCC)    neuropathy in bil feet - left is worse.   Obesity, unspecified    Peripheral neuropathy    Personal history of tobacco use, presenting hazards to health    Restless leg syndrome    Sleep apnea    uses CPAP   Special screening for malignant neoplasms, colon    Squamous cell carcinoma of skin 08/24/2022   SCCIS - scalp, ED&C   Squamous cell carcinoma of skin 07/20/2023   SCC IS right of midline ant scalp post, tx with ED&C   Squamous cell carcinoma of skin 07/20/2023   SCC IS right of midline ant scalp ant, tx with ED&C    PAST SURGICAL HISTORY: Past Surgical History:  Procedure Laterality Date   BREAST BIOPSY Right 1973,2001   BREAST BIOPSY Right 11/07/2013   INVASIVE MAMMARY CARCINOMA , ER/PR positive Her2 negative   BREAST BIOPSY Left 11/27/2013   invasive lobular and DCIS   BREAST SURGERY Bilateral 01/09/2014   mastectomy   cataract surgery Left 08/13/2022   CHOLECYSTECTOMY     COLONOSCOPY  2010   Dr. Evette Cristal   COLONOSCOPY WITH PROPOFOL N/A 06/11/2015   Procedure: COLONOSCOPY WITH PROPOFOL;  Surgeon: Kieth Brightly, MD;  Location: [facility] ENDOSCOPY;  Service: Endoscopy;  Laterality: N/A;   COLONOSCOPY WITH PROPOFOL N/A 12/23/2021   Procedure: COLONOSCOPY WITH PROPOFOL;  Surgeon: Toney Reil, MD;  Location: [facility] ENDOSCOPY;  Service: Gastroenterology;  Laterality: N/A;   DILATATION & CURETTAGE/HYSTEROSCOPY WITH MYOSURE N/A 07/12/2018   Procedure: DILATATION & CURETTAGE/HYSTEROSCOPY WITH MYOSURE WITH ENDOMETRIAL POLYPECTOMY;  Surgeon: Conard Novak, MD;  Location:  [facility] ORS;  Service: Gynecology;  Laterality: N/A;   DILATION AND CURETTAGE OF UTERUS  1983   ESOPHAGOGASTRODUODENOSCOPY N/A 12/23/2021   Procedure: ESOPHAGOGASTRODUODENOSCOPY (EGD);  Surgeon: Toney Reil, MD;  Location: [facility] ENDOSCOPY;  Service: Gastroenterology;  Laterality: N/A;   JOINT REPLACEMENT Left 11/23/2015   right  2023   KNEE ARTHROPLASTY Right 03/06/2022   Procedure: COMPUTER ASSISTED TOTAL KNEE ARTHROPLASTY;  Surgeon: Donato Heinz, MD;  Location: [facility] ORS;  Service: Orthopedics;  Laterality: Right;   LAPAROSCOPIC BILATERAL SALPINGO OOPHERECTOMY N/A 08/04/2023   Procedure: LAPAROSCOPIC BILATERAL SALPINGO OOPHORECTOMY;  Surgeon: Leida Lauth, MD;  Location: [facility] ORS;  Service: Gynecology;  Laterality: N/A;   POLYPECTOMY  1989   PORTA CATH REMOVAL     PORTACATH PLACEMENT  2015   SKIN BIOPSY     on scalp   SPINE SURGERY  09/14/2014   Ruptured disk L4- L5   TOTAL KNEE ARTHROPLASTY Left 11/22/2015   Procedure: TOTAL KNEE ARTHROPLASTY;  Surgeon: Frederico Hamman, MD;  [facility] OR;  Service: Orthopedics;  Laterality: Left;   TUBAL LIGATION     WISDOM TOOTH EXTRACTION  2006    FAMILY HISTORY Family History  Problem Relation Age of Onset   Lung cancer Mother 44   COPD Mother  Colon cancer Father 34   Stomach cancer Father 15   Hypertension Sister    Breast cancer Maternal Aunt        dx over 22   Breast cancer Paternal Aunt        dx 44s   Breast cancer Paternal Aunt        dx 76s   Rectal cancer Paternal Uncle        dx 35s   Rectal cancer Paternal Uncle        dx 44s   Diabetes Maternal Grandmother    Pancreatic cancer Paternal Grandfather    Rectal cancer Cousin        dx 49s-60s   Rectal cancer Cousin        dx >50    GYNECOLOGIC HISTORY:  Patient's last menstrual period was 12/22/1999 (approximate).     ADVANCED DIRECTIVES:    HEALTH MAINTENANCE: Social History   Tobacco Use   Smoking status: Former    Current packs/day: 0.00     Average packs/day: 1 pack/day for 30.0 years (30.0 ttl pk-yrs)    Types: Cigarettes    Start date: 12/12/1983    Quit date: 12/11/2013    Years since quitting: 10.2   Smokeless tobacco: Former  Building services engineer status: Never Used  Substance Use Topics   Alcohol use: Not Currently   Drug use: No     Colonoscopy:  PAP:  Bone density:  Mammogram:  No Known Allergies  Current Outpatient Medications  Medication Sig Dispense Refill   acetaminophen (TYLENOL) 500 MG tablet Take 1,000 mg by mouth 2 (two) times daily as needed for moderate pain or headache.     baclofen (LIORESAL) 10 MG tablet Take 1 tablet (10 mg total) by mouth 3 (three) times daily as needed for muscle spasms. 270 tablet 1   calcium carbonate (OSCAL) 1500 (600 Ca) MG TABS tablet Take by mouth 2 (two) times daily with a meal.     dapagliflozin propanediol (FARXIGA) 10 MG TABS tablet Take 10 mg by mouth daily.     diclofenac Sodium (VOLTAREN) 1 % GEL Apply 2 g topically 3 (three) times daily as needed. 100 g 2   ezetimibe (ZETIA) 10 MG tablet Take 1 tablet (10 mg total) by mouth daily. 90 tablet 3   fluticasone (FLONASE) 50 MCG/ACT nasal spray PLACE 2 SPRAYS INTO BOTH NOSTRILS AT BEDTIME. 48 g 1   fulvestrant (FASLODEX) 250 MG/5ML injection Inject 500 mg into the muscle every 30 (thirty) days. One injection each buttock over 1-2 minutes. Warm prior to use.     Krill Oil 1000 MG CAPS Take 1,000 mg by mouth daily.     lisinopril (ZESTRIL) 5 MG tablet Take 1 tablet (5 mg total) by mouth daily. 90 tablet 3   loperamide (IMODIUM) 2 MG capsule TAKE 2 TABLETS (4 MG TOTAL) BY MOUTH 4 (FOUR) TIMES DAILY AS NEEDED FOR DIARRHEA OR LOOSE STOOLS. 30 capsule 1   metFORMIN (GLUCOPHAGE) 500 MG tablet Take 1 tablet (500 mg total) by mouth 2 (two) times daily with a meal. 180 tablet 3   metoprolol tartrate (LOPRESSOR) 25 MG tablet TAKE 1/2 TABLET TWICE A DAY BY MOUTH 90 tablet 3   omeprazole (PRILOSEC) 40 MG capsule TAKE 1 CAPSULE BY MOUTH  TWICE A DAY 180 capsule 1   ondansetron (ZOFRAN) 8 MG tablet Take 1 tablet (8 mg total) by mouth every 8 (eight) hours as needed for nausea or vomiting. 20 tablet 0  prochlorperazine (COMPAZINE) 10 MG tablet Take 1 tablet (10 mg total) by mouth every 6 (six) hours as needed for nausea or vomiting. 20 tablet 1   ribociclib succ (KISQALI, 600 MG DOSE,) 200 MG Therapy Pack Take 3 tablets (600 mg total) by mouth daily. Take for 21 days on, 7 days off, repeat every 28 days. 63 tablet 5   sodium chloride (OCEAN) 0.65 % SOLN nasal spray Place 1 spray into both nostrils as needed for congestion.     traZODone (DESYREL) 50 MG tablet TAKE 1 TABLET (50 MG TOTAL) BY MOUTH AT BEDTIME AS NEEDED. FOR SLEEP 90 tablet 1   venlafaxine XR (EFFEXOR-XR) 75 MG 24 hr capsule Take 1 capsule (75 mg total) by mouth daily with breakfast. 360 capsule 0   vitamin B-12 (CYANOCOBALAMIN) 1000 MCG tablet Take 2,500 mcg by mouth every Monday, Wednesday, and Friday.     Vitamin D, Cholecalciferol, 50 MCG (2000 UT) CAPS Take 2,000 Units by mouth every Monday, Wednesday, and Friday.     No current facility-administered medications for this visit.    OBJECTIVE: BP 121/61 (BP Location: Left Arm, Patient Position: Sitting, Cuff Size: Normal)   Pulse 77   Temp 97.9 F (36.6 C) (Tympanic)   Resp 18   Ht 5' 5.5" (1.664 m)   Wt 203 lb 12.8 oz (92.4 kg)   LMP 12/22/1999 (Approximate)   SpO2 98%   BMI 33.40 kg/m    Body mass index is 33.4 kg/m.    ECOG FS:0 - Asymptomatic  General: Well-developed, well-nourished, no acute distress. Eyes: Pink conjunctiva, anicteric sclera. HEENT: Normocephalic, moist mucous membranes. Lungs: No audible wheezing or coughing. Heart: Regular rate and rhythm. Abdomen: Soft, nontender, no obvious distention. Musculoskeletal: No edema, cyanosis, or clubbing. Neuro: Alert, answering all questions appropriately. Cranial nerves grossly intact. Skin: No rashes or petechiae noted. Psych: Normal  affect.  LAB RESULTS:  Appointment on 03/02/2024  Component Date Value Ref Range Status   WBC 03/02/2024 4.1  4.0 - 10.5 K/uL Final   RBC 03/02/2024 2.69 (L)  3.87 - 5.11 MIL/uL Final   Hemoglobin 03/02/2024 10.0 (L)  12.0 - 15.0 g/dL Final   HCT 16/08/9603 29.9 (L)  36.0 - 46.0 % Final   MCV 03/02/2024 111.2 (H)  80.0 - 100.0 fL Final   MCH 03/02/2024 37.2 (H)  26.0 - 34.0 pg Final   MCHC 03/02/2024 33.4  30.0 - 36.0 g/dL Final   RDW 54/07/8118 14.7  11.5 - 15.5 % Final   Platelets 03/02/2024 177  150 - 400 K/uL Final   nRBC 03/02/2024 0.0  0.0 - 0.2 % Final   Neutrophils Relative % 03/02/2024 43  % Final   Neutro Abs 03/02/2024 1.8  1.7 - 7.7 K/uL Final   Lymphocytes Relative 03/02/2024 32  % Final   Lymphs Abs 03/02/2024 1.3  0.7 - 4.0 K/uL Final   Monocytes Relative 03/02/2024 18  % Final   Monocytes Absolute 03/02/2024 0.7  0.1 - 1.0 K/uL Final   Eosinophils Relative 03/02/2024 3  % Final   Eosinophils Absolute 03/02/2024 0.1  0.0 - 0.5 K/uL Final   Basophils Relative 03/02/2024 3  % Final   Basophils Absolute 03/02/2024 0.1  0.0 - 0.1 K/uL Final   Immature Granulocytes 03/02/2024 1  % Final   Abs Immature Granulocytes 03/02/2024 0.02  0.00 - 0.07 K/uL Final   Performed at [facility], [address] [place], [place] [address]   Sodium 03/02/2024 139  135 -  145 mmol/L Final   Potassium 03/02/2024 4.8  3.5 - 5.1 mmol/L Final   Chloride 03/02/2024 107  98 - 111 mmol/L Final   CO2 03/02/2024 24  22 - 32 mmol/L Final   Glucose, Bld 03/02/2024 123 (H)  70 - 99 mg/dL Final   Glucose reference range applies only to samples taken after fasting for at least 8 hours.   BUN 03/02/2024 28 (H)  8 - 23 mg/dL Final   Creatinine, Ser 03/02/2024 1.80 (H)  0.44 - 1.00 mg/dL Final   Calcium 62/13/0865 9.4  8.9 - 10.3 mg/dL Final   Total Protein 78/46/9629 7.4  6.5 - 8.1 g/dL Final   Albumin 52/84/1324 3.9  3.5 - 5.0 g/dL Final   AST 40/08/2724 33  15 - 41 U/L Final   ALT  03/02/2024 25  0 - 44 U/L Final   Alkaline Phosphatase 03/02/2024 79  38 - 126 U/L Final   Total Bilirubin 03/02/2024 0.5  0.0 - 1.2 mg/dL Final   GFR, Estimated 03/02/2024 30 (L)  >60 mL/min Final   Comment: (NOTE) Calculated using the CKD-EPI Creatinine Equation (2021)    Anion gap 03/02/2024 8  5 - 15 Final   Performed at [facility], [address] [place], [place] [address]    STUDIES: No results found.  ONCOLOGY HISTORY:  Bilateral adenocarcinoma of the breast. Patient is status post bilateral mastectomy in February 2015. She also received adjuvant chemotherapy with Adriamycin, Cytoxan, and Taxol completing treatment in approximately October 2015. She then initiated letrozole, but could not tolerate secondary to leg pain and was switched to Aromasin.  Patient then switched to tamoxifen in October 2019 secondary to cost.  Patient subsequently discontinued tamoxifen and did not complete the recommended 5 years of treatment.   ASSESSMENT: Recurrent, metastatic ER/PR, HER2 negative invasive carcinoma of the breast.   PLAN:    Recurrent, metastatic ER/PR, HER2 negative invasive carcinoma of the breast: See oncology history as above.  Biopsy confirmed recurrent disease.  PET scan results from December 26, 2021 reviewed independently with hypermetabolic left supraclavicular, subpectoral, and axillary lymphadenopathy consistent with nodal recurrence of patient's history of breast cancer.  No distant metastases were identified.  Her most recent PET scan on December 01, 2023 reviewed independently without evidence of recurrent or metastatic disease.  Her initial CA 27-29 initially was 260.1.  Now seems to have plateaued between 74.3 and 98.5.  Her most recent result was 81.8.  Today's result is pending.  Of note, patient also had metastatic breast cancer in her surgical specimen from her oophorectomy.  Continue Kisqali 600 mg for 21 days with 7 days off.  Proceed with fulvestrant today.  Return  to clinic in 4 weeks for laboratory work and evaluation by clinical pharmacy.  Patient will then return to clinic in 8 weeks for further evaluation and continuation of treatment.  Appreciate clinical pharmacy input.   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  Follow-up with gynecology oncology as scheduled. Multiple sclerosis: Chronic and unchanged.  Continue evaluation and treatment per neurology.  Recent MRI of the brain did not reveal any new lesions. Renal insufficiency: GFR is 30 today.  Kisqali may need to be dose reduced if GFR remains persistently less than 30. Anemia: Chronic and unchanged.  Patient's hemoglobin is 10.0. Macrocytosis: Likely secondary to underlying treatments. Reflux: Patient does not complain of this today.  Continue omeprazole.    Patient expressed understanding and was in agreement with this  plan. She also understands that She can call clinic at any time with any questions, concerns, or complaints.   Breast cancer bilateral, multifocal ER/PR pos, Her 2 neg.   Staging form: Breast, AJCC 7th Edition     Clinical: Stage IIB (T2, N1, M0) - Signed by Johney Maine, MD on 04/22/2015   Jeralyn Ruths, MD   03/02/2024 2:12 PM

## 2024-03-03 LAB — CANCER ANTIGEN 27.29: CA 27.29: 74.9 U/mL — ABNORMAL HIGH (ref 0.0–38.6)

## 2024-03-13 ENCOUNTER — Other Ambulatory Visit: Payer: Self-pay

## 2024-03-13 ENCOUNTER — Other Ambulatory Visit: Payer: Self-pay | Admitting: Pharmacy Technician

## 2024-03-13 NOTE — Progress Notes (Signed)
 Specialty Pharmacy Refill Coordination Note  Cassidy Norris is a 73 y.o. female contacted today regarding refills of specialty medication(s) No data recorded  Patient requested (Patient-Rptd) Delivery   Delivery date: (Patient-Rptd) 03/20/24 New Delivery date 03/21/24 due to this being a fridge item & do not ship over the weekend.   Verified address: (Patient-Rptd) [address],[id number]   Medication will be filled on 03/20/24.    LVM for patient with new shipping date.

## 2024-03-14 DIAGNOSIS — E1169 Type 2 diabetes mellitus with other specified complication: Secondary | ICD-10-CM | POA: Diagnosis not present

## 2024-03-14 DIAGNOSIS — Z96653 Presence of artificial knee joint, bilateral: Secondary | ICD-10-CM | POA: Diagnosis not present

## 2024-03-14 DIAGNOSIS — E785 Hyperlipidemia, unspecified: Secondary | ICD-10-CM | POA: Diagnosis not present

## 2024-03-27 ENCOUNTER — Other Ambulatory Visit: Payer: Self-pay | Admitting: Acute Care

## 2024-03-27 DIAGNOSIS — Z87891 Personal history of nicotine dependence: Secondary | ICD-10-CM

## 2024-03-27 DIAGNOSIS — Z122 Encounter for screening for malignant neoplasm of respiratory organs: Secondary | ICD-10-CM

## 2024-03-30 ENCOUNTER — Inpatient Hospital Stay

## 2024-03-30 ENCOUNTER — Inpatient Hospital Stay: Admitting: Pharmacist

## 2024-03-30 ENCOUNTER — Inpatient Hospital Stay: Attending: Oncology

## 2024-03-30 VITALS — BP 124/59 | HR 61 | Wt 203.7 lb

## 2024-03-30 DIAGNOSIS — C778 Secondary and unspecified malignant neoplasm of lymph nodes of multiple regions: Secondary | ICD-10-CM | POA: Diagnosis not present

## 2024-03-30 DIAGNOSIS — C50919 Malignant neoplasm of unspecified site of unspecified female breast: Secondary | ICD-10-CM

## 2024-03-30 DIAGNOSIS — Z79899 Other long term (current) drug therapy: Secondary | ICD-10-CM | POA: Diagnosis not present

## 2024-03-30 DIAGNOSIS — Z5111 Encounter for antineoplastic chemotherapy: Secondary | ICD-10-CM | POA: Diagnosis not present

## 2024-03-30 DIAGNOSIS — Z170 Estrogen receptor positive status [ER+]: Secondary | ICD-10-CM | POA: Diagnosis not present

## 2024-03-30 LAB — CBC WITH DIFFERENTIAL/PLATELET
Abs Immature Granulocytes: 0.01 10*3/uL (ref 0.00–0.07)
Basophils Absolute: 0.1 10*3/uL (ref 0.0–0.1)
Basophils Relative: 2 %
Eosinophils Absolute: 0.1 10*3/uL (ref 0.0–0.5)
Eosinophils Relative: 3 %
HCT: 31 % — ABNORMAL LOW (ref 36.0–46.0)
Hemoglobin: 10.3 g/dL — ABNORMAL LOW (ref 12.0–15.0)
Immature Granulocytes: 0 %
Lymphocytes Relative: 31 %
Lymphs Abs: 1.3 10*3/uL (ref 0.7–4.0)
MCH: 36.4 pg — ABNORMAL HIGH (ref 26.0–34.0)
MCHC: 33.2 g/dL (ref 30.0–36.0)
MCV: 109.5 fL — ABNORMAL HIGH (ref 80.0–100.0)
Monocytes Absolute: 0.7 10*3/uL (ref 0.1–1.0)
Monocytes Relative: 17 %
Neutro Abs: 1.8 10*3/uL (ref 1.7–7.7)
Neutrophils Relative %: 47 %
Platelets: 187 10*3/uL (ref 150–400)
RBC: 2.83 MIL/uL — ABNORMAL LOW (ref 3.87–5.11)
RDW: 14.4 % (ref 11.5–15.5)
WBC: 4 10*3/uL (ref 4.0–10.5)
nRBC: 0 % (ref 0.0–0.2)

## 2024-03-30 LAB — COMPREHENSIVE METABOLIC PANEL WITH GFR
ALT: 30 U/L (ref 0–44)
AST: 37 U/L (ref 15–41)
Albumin: 4 g/dL (ref 3.5–5.0)
Alkaline Phosphatase: 89 U/L (ref 38–126)
Anion gap: 16 — ABNORMAL HIGH (ref 5–15)
BUN: 30 mg/dL — ABNORMAL HIGH (ref 8–23)
CO2: 19 mmol/L — ABNORMAL LOW (ref 22–32)
Calcium: 9.3 mg/dL (ref 8.9–10.3)
Chloride: 103 mmol/L (ref 98–111)
Creatinine, Ser: 1.89 mg/dL — ABNORMAL HIGH (ref 0.44–1.00)
GFR, Estimated: 28 mL/min — ABNORMAL LOW (ref >60)
Glucose, Bld: 137 mg/dL — ABNORMAL HIGH (ref 70–99)
Potassium: 4.5 mmol/L (ref 3.5–5.1)
Sodium: 138 mmol/L (ref 135–145)
Total Bilirubin: 0.6 mg/dL (ref 0.0–1.2)
Total Protein: 7.5 g/dL (ref 6.5–8.1)

## 2024-03-30 MED ORDER — FULVESTRANT 250 MG/5ML IM SOSY
500.0000 mg | PREFILLED_SYRINGE | Freq: Once | INTRAMUSCULAR | Status: AC
  Administered 2024-03-30: 500 mg via INTRAMUSCULAR
  Filled 2024-03-30: qty 10

## 2024-03-30 NOTE — Progress Notes (Signed)
 Oral Chemotherapy Clinic [facility]  [phone] [phone]  Patient Care Team: Raina Bunting, DO as PCP - General (Family Medicine) Wenona Hamilton, MD as PCP - Cardiology (Cardiology) Merriam Abbey, DO as Consulting Physician (Neurology) Shellie Dials, MD as Consulting Physician (Oncology) Rochell Chroman, RN as Oncology Nurse Navigator Julia Oats, [place] (Optometry)   Name of the patient: Cassidy Norris  [id number]  10/20/1951   Date of visit: 03/30/24  HPI: Patient is a 73 y.o. female with recurrent breast cancer, ER/PR positive/HER2 negative. Currently treating with Kisqali  (ribociclib ) and fulvestrant . She started ribociclib  on 01/08/22. Her treatment was held for surgery which was completed on 08/04/23. Patient resumed her ribociclib  on 08/19/23.  Reason for Consult: Oral chemotherapy follow-up for ribociclib  therapy.   PAST MEDICAL HISTORY: Past Medical History:  Diagnosis Date   Actinic keratosis    Anemia    Arthritis not really diagnosis but have pain   B12 deficiency 07/23/2016   Will check levels to determine if injections are needed again. Await results. Recent CBC at [facility]   Blood transfusion without reported diagnosis 1983 had a miscarriage/dnc   Cancer (HCC) 12/30/2013   Multifocal disease: pT1c,m, N0(isolated tumor cells) Er/ PR positive, Her 2 negative.   Cancer (HCC) 01/09/2014   Invasive lobular carcinoma, 2.2 cm, T2,N1 ER/PR positive, HER-2/neu negative   Cataract 2014?   Centrilobular emphysema (HCC) 10/27/2021   CKD (chronic kidney disease), stage IV (HCC) 07/13/2023   Complication of anesthesia 03/06/2022   Prolonged metabolism of Rocuronium  during case.   Diabetes mellitus without complication (HCC)    Diffuse cystic mastopathy    Erosive esophagitis    Essential hypertension, benign 09/24/2017   Family history of malignant neoplasm of gastrointestinal tract 2012   Fractured elbow  08/28/2014   Gastroesophageal reflux disease    Hx of metabolic acidosis with increased anion gap    Increased heart rate    Multiple sclerosis (HCC)    Neuromuscular disorder (HCC)    neuropathy in bil feet - left is worse.   Obesity, unspecified    Peripheral neuropathy    Personal history of tobacco use, presenting hazards to health    Restless leg syndrome    Sleep apnea    uses CPAP   Special screening for malignant neoplasms, colon    Squamous cell carcinoma of skin 08/24/2022   SCCIS - scalp, ED&C   Squamous cell carcinoma of skin 07/20/2023   SCC IS right of midline ant scalp post, tx with ED&C   Squamous cell carcinoma of skin 07/20/2023   SCC IS right of midline ant scalp ant, tx with [facility]    HEMATOLOGY/ONCOLOGY HISTORY:  Oncology History Overview Note  1. Bilateral carcinoma of breast, February, 2015. Right breast status post radical mastectomy. T1 C. N0M0 (isolated tumor cells in one lymph node) multifocal invasive cancer. Stage IC, Left breast, Status post radical mastectomy, T2, N1, M0 tumor. Stage II, RIght breast. Both tumors are estrogen receptor positive.  Progesterone receptor positive.  HER-2 receptor negative by FISH. Negative for BRCA mutation.  2. Started on Adriamycin and Cytoxan on March 01, 2014. Last cycle of Cytoxan and Adriamycin on May 01, 2014. Patient has finished last chemotherapy on September 9. (12 th dose was omitted because of neuropathy) 3.  Taking tamoxifen .  Patient had a poor tolerance to letrozole.(October, 2015) 4.  Patient is taking letrozole since November of 2015 5.  May, 2016 Letrozole has been put  on hold because of bony pains 6.  Started on Aromasin  from July of 2016    History of bilateral breast cancer (Resolved)  11/29/2013 Initial Diagnosis   Breast cancer bilateral, multifocal ER/PR pos, Her 2 neg,.     ALLERGIES:  has no known allergies.  MEDICATIONS:  Current Outpatient Medications  Medication Sig Dispense Refill    acetaminophen  (TYLENOL ) 500 MG tablet Take 1,000 mg by mouth 2 (two) times daily as needed for moderate pain or headache.     baclofen  (LIORESAL ) 10 MG tablet Take 1 tablet (10 mg total) by mouth 3 (three) times daily as needed for muscle spasms. 270 tablet 1   calcium  carbonate (OSCAL) 1500 (600 Ca) MG TABS tablet Take by mouth 2 (two) times daily with a meal.     dapagliflozin propanediol (FARXIGA) 10 MG TABS tablet Take 10 mg by mouth daily.     diclofenac  Sodium (VOLTAREN ) 1 % GEL Apply 2 g topically 3 (three) times daily as needed. 100 g 2   ezetimibe  (ZETIA ) 10 MG tablet Take 1 tablet (10 mg total) by mouth daily. 90 tablet 3   fluticasone  (FLONASE ) 50 MCG/ACT nasal spray PLACE 2 SPRAYS INTO BOTH NOSTRILS AT BEDTIME. 48 g 1   fulvestrant  (FASLODEX ) 250 MG/5ML injection Inject 500 mg into the muscle every 30 (thirty) days. One injection each buttock over 1-2 minutes. Warm prior to use.     Krill Oil 1000 MG CAPS Take 1,000 mg by mouth daily.     lisinopril  (ZESTRIL ) 5 MG tablet Take 1 tablet (5 mg total) by mouth daily. 90 tablet 3   loperamide  (IMODIUM ) 2 MG capsule TAKE 2 TABLETS (4 MG TOTAL) BY MOUTH 4 (FOUR) TIMES DAILY AS NEEDED FOR DIARRHEA OR LOOSE STOOLS. 30 capsule 1   metFORMIN  (GLUCOPHAGE ) 500 MG tablet Take 1 tablet (500 mg total) by mouth 2 (two) times daily with a meal. 180 tablet 3   metoprolol  tartrate (LOPRESSOR ) 25 MG tablet TAKE 1/2 TABLET TWICE A DAY BY MOUTH 90 tablet 3   omeprazole  (PRILOSEC) 40 MG capsule TAKE 1 CAPSULE BY MOUTH TWICE A DAY 180 capsule 1   ondansetron  (ZOFRAN ) 8 MG tablet Take 1 tablet (8 mg total) by mouth every 8 (eight) hours as needed for nausea or vomiting. 20 tablet 0   prochlorperazine  (COMPAZINE ) 10 MG tablet Take 1 tablet (10 mg total) by mouth every 6 (six) hours as needed for nausea or vomiting. 20 tablet 1   ribociclib  succ (KISQALI , 600 MG DOSE,) 200 MG Therapy Pack Take 3 tablets (600 mg total) by mouth daily. Take for 21 days on, 7 days off,  repeat every 28 days. 63 tablet 5   sodium chloride  (OCEAN) 0.65 % SOLN nasal spray Place 1 spray into both nostrils as needed for congestion.     traZODone  (DESYREL ) 50 MG tablet TAKE 1 TABLET (50 MG TOTAL) BY MOUTH AT BEDTIME AS NEEDED. FOR SLEEP 90 tablet 1   venlafaxine  XR (EFFEXOR -XR) 75 MG 24 hr capsule Take 1 capsule (75 mg total) by mouth daily with breakfast. 360 capsule 0   vitamin B-12 (CYANOCOBALAMIN ) 1000 MCG tablet Take 2,500 mcg by mouth every Monday, Wednesday, and Friday.     Vitamin D , Cholecalciferol , 50 MCG (2000 UT) CAPS Take 2,000 Units by mouth every Monday, Wednesday, and Friday.     No current facility-administered medications for this visit.    VITAL SIGNS: BP (!) 124/59 (BP Location: Left Arm, Patient Position: Sitting, Cuff Size: Large)  Pulse 61   Wt 92.4 kg (203 lb 11.2 oz)   LMP 12/22/1999 (Approximate)   BMI 33.38 kg/m  Filed Weights   03/30/24 1016  Weight: 92.4 kg (203 lb 11.2 oz)      Estimated body mass index is 33.38 kg/m as calculated from the following:   Height as of 03/02/24: 5' 5.5" (1.664 m).   Weight as of this encounter: 92.4 kg (203 lb 11.2 oz).  LABS: CBC:    Component Value Date/Time   WBC 4.0 03/30/2024 0950   HGB 10.3 (L) 03/30/2024 0950   HGB 9.1 (L) 02/18/2023 0944   HGB 15.5 07/13/2017 0937   HCT 31.0 (L) 03/30/2024 0950   HCT 46.7 (H) 07/13/2017 0937   PLT 187 03/30/2024 0950   PLT 211 02/18/2023 0944   PLT 211 07/13/2017 0937   MCV 109.5 (H) 03/30/2024 0950   MCV 94 07/13/2017 0937   MCV 93 03/11/2015 1356   NEUTROABS 1.8 03/30/2024 0950   NEUTROABS 5.4 07/13/2017 0937   NEUTROABS 5.6 03/11/2015 1356   LYMPHSABS 1.3 03/30/2024 0950   LYMPHSABS 2.1 07/13/2017 0937   LYMPHSABS 2.3 03/11/2015 1356   MONOABS 0.7 03/30/2024 0950   MONOABS 1.0 (H) 03/11/2015 1356   EOSABS 0.1 03/30/2024 0950   EOSABS 0.2 07/13/2017 0937   EOSABS 0.3 03/11/2015 1356   BASOSABS 0.1 03/30/2024 0950   BASOSABS 0.0 07/13/2017 0937    BASOSABS 0.1 03/11/2015 1356   Comprehensive Metabolic Panel:    Component Value Date/Time   NA 138 03/30/2024 0950   NA 142 07/14/2019 1516   NA 139 03/11/2015 1356   K 4.5 03/30/2024 0950   K 3.4 (L) 03/11/2015 1356   CL 103 03/30/2024 0950   CL 103 03/11/2015 1356   CO2 19 (L) 03/30/2024 0950   CO2 28 03/11/2015 1356   BUN 30 (H) 03/30/2024 0950   BUN 12 07/14/2019 1516   BUN 13 03/11/2015 1356   CREATININE 1.89 (H) 03/30/2024 0950   CREATININE 1.62 (H) 09/16/2023 0856   CREATININE 0.85 10/20/2021 0838   GLUCOSE 137 (H) 03/30/2024 0950   GLUCOSE 118 (H) 03/11/2015 1356   CALCIUM  9.3 03/30/2024 0950   CALCIUM  9.2 03/11/2015 1356   AST 37 03/30/2024 0950   AST 28 09/16/2023 0856   ALT 30 03/30/2024 0950   ALT 20 09/16/2023 0856   ALT 32 03/11/2015 1356   ALKPHOS 89 03/30/2024 0950   ALKPHOS 68 03/11/2015 1356   BILITOT 0.6 03/30/2024 0950   BILITOT 0.4 09/16/2023 0856   PROT 7.5 03/30/2024 0950   PROT 6.9 07/14/2019 1516   PROT 6.9 03/11/2015 1356   ALBUMIN 4.0 03/30/2024 0950   ALBUMIN 4.5 07/14/2019 1516   ALBUMIN 4.2 03/11/2015 1356     Present during today's visit: patient only   Assessment and Plan: CBC/CMP reviewed, continue ribociclib  600mg  21on/7 off  Continue to monitor renal function Patient received fulvestrant  today    Oral Chemotherapy Side Effect/Intolerance:  No side effects reported  Oral Chemotherapy Adherence: No missed doses reported this month There are no patient barriers to medication adherence identified.   Other:  Patient mentioned recent weight gain. She has made some adjustment in her diet and not noticed any impacted. Currently patient reports no ongoing exercise, encouraged patient to try to exercise and get active as much her knees will allow Patient reported a fall getting into a car, she fell backward landing on her bottom on gravel, no impact to her extremities or head,  no pain or issue moving post fall  New medications: None  reported  Medication Access Issues: No issues, receives Health and safety inspector and fill at [employer] (Specialty).   Patient expressed understanding and was in agreement with this plan. She also understands that She can call clinic at any time with any questions, concerns, or complaints.   Follow-up plan: RTC in 4 weeks.   Thank you for allowing me to participate in the care of this very pleasant patient.   Time Total: 15 minutes  Visit consisted of counseling and education on dealing with issues of symptom management in the setting of serious and potentially life-threatening illness.Greater than 50%  of this time was spent counseling and coordinating care related to the above assessment and plan.  Signed by: Maysun Meditz N. Hetty Linhart, PharmD, Lorraine Roses, CPP Hematology/Oncology Clinical Pharmacist Practitioner [facility] [phone]  03/30/2024 12:48 PM

## 2024-03-31 LAB — CANCER ANTIGEN 27.29: CA 27.29: 78.6 U/mL — ABNORMAL HIGH (ref 0.0–38.6)

## 2024-04-04 DIAGNOSIS — M2042 Other hammer toe(s) (acquired), left foot: Secondary | ICD-10-CM | POA: Diagnosis not present

## 2024-04-04 DIAGNOSIS — E1169 Type 2 diabetes mellitus with other specified complication: Secondary | ICD-10-CM | POA: Diagnosis not present

## 2024-04-04 DIAGNOSIS — G620 Drug-induced polyneuropathy: Secondary | ICD-10-CM | POA: Diagnosis not present

## 2024-04-04 DIAGNOSIS — T451X5A Adverse effect of antineoplastic and immunosuppressive drugs, initial encounter: Secondary | ICD-10-CM | POA: Diagnosis not present

## 2024-04-07 ENCOUNTER — Other Ambulatory Visit: Payer: Self-pay

## 2024-04-10 ENCOUNTER — Other Ambulatory Visit: Payer: Self-pay | Admitting: Pharmacy Technician

## 2024-04-10 ENCOUNTER — Other Ambulatory Visit: Payer: Self-pay

## 2024-04-10 NOTE — Progress Notes (Signed)
 Specialty Pharmacy Refill Coordination Note  Cassidy Norris is a 73 y.o. female contacted today regarding refills of specialty medication(s) Ribociclib  Succinate (KISQALI  600mg  Daily Dose)   Patient requested Delivery   Delivery date: 04/24/24   Verified address: [address] [place] [address]   Medication will be filled on 04/21/24.

## 2024-04-17 ENCOUNTER — Encounter: Payer: Self-pay | Admitting: Family Medicine

## 2024-04-17 DIAGNOSIS — F5101 Primary insomnia: Secondary | ICD-10-CM

## 2024-04-18 MED ORDER — VENLAFAXINE HCL ER 75 MG PO CP24: 75.0000 mg | ORAL_CAPSULE | Freq: Every day | ORAL | 3 refills | Status: AC

## 2024-04-21 ENCOUNTER — Other Ambulatory Visit: Payer: Self-pay

## 2024-04-24 ENCOUNTER — Other Ambulatory Visit: Payer: Self-pay

## 2024-04-26 ENCOUNTER — Other Ambulatory Visit: Payer: Self-pay | Admitting: *Deleted

## 2024-04-26 DIAGNOSIS — C50919 Malignant neoplasm of unspecified site of unspecified female breast: Secondary | ICD-10-CM

## 2024-04-27 ENCOUNTER — Inpatient Hospital Stay

## 2024-04-27 ENCOUNTER — Encounter: Payer: Self-pay | Admitting: Oncology

## 2024-04-27 ENCOUNTER — Inpatient Hospital Stay (HOSPITAL_BASED_OUTPATIENT_CLINIC_OR_DEPARTMENT_OTHER): Admitting: Oncology

## 2024-04-27 ENCOUNTER — Other Ambulatory Visit (HOSPITAL_COMMUNITY): Payer: Self-pay

## 2024-04-27 ENCOUNTER — Other Ambulatory Visit: Payer: Self-pay

## 2024-04-27 ENCOUNTER — Inpatient Hospital Stay: Admitting: Pharmacist

## 2024-04-27 ENCOUNTER — Inpatient Hospital Stay: Attending: Oncology

## 2024-04-27 DIAGNOSIS — Z5111 Encounter for antineoplastic chemotherapy: Secondary | ICD-10-CM | POA: Insufficient documentation

## 2024-04-27 DIAGNOSIS — Z79811 Long term (current) use of aromatase inhibitors: Secondary | ICD-10-CM | POA: Diagnosis not present

## 2024-04-27 DIAGNOSIS — Z170 Estrogen receptor positive status [ER+]: Secondary | ICD-10-CM | POA: Diagnosis not present

## 2024-04-27 DIAGNOSIS — C50919 Malignant neoplasm of unspecified site of unspecified female breast: Secondary | ICD-10-CM | POA: Insufficient documentation

## 2024-04-27 DIAGNOSIS — Z9013 Acquired absence of bilateral breasts and nipples: Secondary | ICD-10-CM | POA: Insufficient documentation

## 2024-04-27 DIAGNOSIS — C778 Secondary and unspecified malignant neoplasm of lymph nodes of multiple regions: Secondary | ICD-10-CM | POA: Insufficient documentation

## 2024-04-27 DIAGNOSIS — Z87891 Personal history of nicotine dependence: Secondary | ICD-10-CM | POA: Diagnosis not present

## 2024-04-27 LAB — CBC WITH DIFFERENTIAL (CANCER CENTER ONLY)
Abs Immature Granulocytes: 0.02 10*3/uL (ref 0.00–0.07)
Basophils Absolute: 0.1 10*3/uL (ref 0.0–0.1)
Basophils Relative: 2 %
Eosinophils Absolute: 0.1 10*3/uL (ref 0.0–0.5)
Eosinophils Relative: 3 %
HCT: 28.5 % — ABNORMAL LOW (ref 36.0–46.0)
Hemoglobin: 9.5 g/dL — ABNORMAL LOW (ref 12.0–15.0)
Immature Granulocytes: 1 %
Lymphocytes Relative: 30 %
Lymphs Abs: 1.2 10*3/uL (ref 0.7–4.0)
MCH: 36.8 pg — ABNORMAL HIGH (ref 26.0–34.0)
MCHC: 33.3 g/dL (ref 30.0–36.0)
MCV: 110.5 fL — ABNORMAL HIGH (ref 80.0–100.0)
Monocytes Absolute: 0.7 10*3/uL (ref 0.1–1.0)
Monocytes Relative: 17 %
Neutro Abs: 1.9 10*3/uL (ref 1.7–7.7)
Neutrophils Relative %: 47 %
Platelet Count: 153 10*3/uL (ref 150–400)
RBC: 2.58 MIL/uL — ABNORMAL LOW (ref 3.87–5.11)
RDW: 14.6 % (ref 11.5–15.5)
WBC Count: 4 10*3/uL (ref 4.0–10.5)
nRBC: 0 % (ref 0.0–0.2)

## 2024-04-27 LAB — CMP (CANCER CENTER ONLY)
ALT: 26 U/L (ref 0–44)
AST: 33 U/L (ref 15–41)
Albumin: 3.8 g/dL (ref 3.5–5.0)
Alkaline Phosphatase: 77 U/L (ref 38–126)
Anion gap: 12 (ref 5–15)
BUN: 28 mg/dL — ABNORMAL HIGH (ref 8–23)
CO2: 23 mmol/L (ref 22–32)
Calcium: 9.4 mg/dL (ref 8.9–10.3)
Chloride: 107 mmol/L (ref 98–111)
Creatinine: 1.73 mg/dL — ABNORMAL HIGH (ref 0.44–1.00)
GFR, Estimated: 31 mL/min — ABNORMAL LOW (ref >60)
Glucose, Bld: 124 mg/dL — ABNORMAL HIGH (ref 70–99)
Potassium: 4.3 mmol/L (ref 3.5–5.1)
Sodium: 142 mmol/L (ref 135–145)
Total Bilirubin: 0.6 mg/dL (ref 0.0–1.2)
Total Protein: 7.2 g/dL (ref 6.5–8.1)

## 2024-04-27 MED ORDER — FULVESTRANT 250 MG/5ML IM SOSY
500.0000 mg | PREFILLED_SYRINGE | Freq: Once | INTRAMUSCULAR | Status: AC
  Administered 2024-04-27: 500 mg via INTRAMUSCULAR
  Filled 2024-04-27: qty 10

## 2024-04-27 MED ORDER — RIBOCICLIB SUCC (400 MG DOSE) 200 MG PO TBPK
400.0000 mg | ORAL_TABLET | Freq: Every day | ORAL | 1 refills | Status: DC
  Filled 2024-04-27: qty 42, 21d supply, fill #0
  Filled 2024-05-10: qty 42, 28d supply, fill #0
  Filled 2024-06-07: qty 42, 28d supply, fill #1

## 2024-04-27 MED ORDER — ONDANSETRON HCL 8 MG PO TABS: 8.0000 mg | ORAL_TABLET | Freq: Three times a day (TID) | ORAL | 0 refills | Status: DC | PRN

## 2024-04-27 NOTE — Progress Notes (Signed)
 [facility]  [phone]  [phone]     Cassidy Norris DOB: Aug 18, 1951  MR#: [id number]  AOZ#:[id number]  Patient Care Team: Raina Bunting, DO as PCP - General (Family Medicine) Wenona Hamilton, MD as PCP - Cardiology (Cardiology) Merriam Abbey, DO as Consulting Physician (Neurology) Shellie Dials, MD as Consulting Physician (Oncology) Rochell Chroman, RN as Oncology Nurse Navigator Julia Oats, OD (Optometry)  CHIEF COMPLAINT: Recurrent, metastatic ER/PR, HER2 negative invasive carcinoma of the breast.    INTERVAL HISTORY: Patient returns to clinic today for further evaluation and continuation of Kisqali  and fulvestrant .  Patient reports increased nausea and vomiting this past month, but otherwise feels well.  She has not complained of any pain.  She denies any weakness or fatigue. She has no new neurologic complaints.  She denies any recent fevers or illnesses.  She has a fair appetite and denies weight loss.  She has no chest pain, shortness of breath, cough, or hemoptysis.  She denies any constipation or diarrhea.  She has no urinary complaints.  Patient offers no further specific complaints today.  REVIEW OF SYSTEMS:   Review of Systems  Constitutional: Negative.  Negative for fever, malaise/fatigue and weight loss.  Respiratory: Negative.  Negative for cough, hemoptysis and shortness of breath.   Cardiovascular: Negative.  Negative for chest pain and leg swelling.  Gastrointestinal:  Positive for nausea and vomiting. Negative for abdominal pain.  Genitourinary: Negative.  Negative for dysuria and flank pain.  Musculoskeletal:  Negative for joint pain and myalgias.  Skin: Negative.  Negative for rash.  Neurological: Negative.  Negative for dizziness, sensory change, focal weakness, weakness and headaches.  Psychiatric/Behavioral: Negative.  The patient is not nervous/anxious.     As per HPI. Otherwise, a complete  review of systems is negative.  ONCOLOGY HISTORY: Oncology History Overview Note  1. Bilateral carcinoma of breast, February, 2015. Right breast status post radical mastectomy. T1 C. N0M0 (isolated tumor cells in one lymph node) multifocal invasive cancer. Stage IC, Left breast, Status post radical mastectomy, T2, N1, M0 tumor. Stage II, RIght breast. Both tumors are estrogen receptor positive.  Progesterone receptor positive.  HER-2 receptor negative by FISH. Negative for BRCA mutation.  2. Started on Adriamycin and Cytoxan on March 01, 2014. Last cycle of Cytoxan and Adriamycin on May 01, 2014. Patient has finished last chemotherapy on September 9. (12 th dose was omitted because of neuropathy) 3.  Taking tamoxifen .  Patient had a poor tolerance to letrozole.(October, 2015) 4.  Patient is taking letrozole since November of 2015 5.  May, 2016 Letrozole has been put on hold because of bony pains 6.  Started on Aromasin  from July of 2016    History of bilateral breast cancer (Resolved)  11/29/2013 Initial Diagnosis   Breast cancer bilateral, multifocal ER/PR pos, Her 2 neg,.     PAST MEDICAL HISTORY: Past Medical History:  Diagnosis Date   Actinic keratosis    Anemia    Arthritis not really diagnosis but have pain   B12 deficiency 07/23/2016   Will check levels to determine if injections are needed again. Await results. Recent CBC at Onc WNL   Blood transfusion without reported diagnosis 1983 had a miscarriage/dnc   Cancer (HCC) 12/30/2013   Multifocal disease: pT1c,m, N0(isolated tumor cells) Er/ PR positive, Her 2 negative.   Cancer (HCC) 01/09/2014   Invasive lobular carcinoma, 2.2 cm, T2,N1 ER/PR positive, HER-2/neu negative   Cataract 2014?  Centrilobular emphysema (HCC) 10/27/2021   CKD (chronic kidney disease), stage IV (HCC) 07/13/2023   Complication of anesthesia 03/06/2022   Prolonged metabolism of Rocuronium  during case.   Diabetes mellitus without complication (HCC)     Diffuse cystic mastopathy    Erosive esophagitis    Essential hypertension, benign 09/24/2017   Family history of malignant neoplasm of gastrointestinal tract 2012   Fractured elbow 08/28/2014   Gastroesophageal reflux disease    Hx of metabolic acidosis with increased anion gap    Increased heart rate    Multiple sclerosis (HCC)    Neuromuscular disorder (HCC)    neuropathy in bil feet - left is worse.   Obesity, unspecified    Peripheral neuropathy    Personal history of tobacco use, presenting hazards to health    Restless leg syndrome    Sleep apnea    uses CPAP   Special screening for malignant neoplasms, colon    Squamous cell carcinoma of skin 08/24/2022   SCCIS - scalp, ED&C   Squamous cell carcinoma of skin 07/20/2023   SCC IS right of midline ant scalp post, tx with ED&C   Squamous cell carcinoma of skin 07/20/2023   SCC IS right of midline ant scalp ant, tx with ED&C    PAST SURGICAL HISTORY: Past Surgical History:  Procedure Laterality Date   BREAST BIOPSY Right 1973,2001   BREAST BIOPSY Right 11/07/2013   INVASIVE MAMMARY CARCINOMA , ER/PR positive Her2 negative   BREAST BIOPSY Left 11/27/2013   invasive lobular and DCIS   BREAST SURGERY Bilateral 01/09/2014   mastectomy   cataract surgery Left 08/13/2022   CHOLECYSTECTOMY     COLONOSCOPY  2010   Dr. Lorel Roes   COLONOSCOPY WITH PROPOFOL  N/A 06/11/2015   Procedure: COLONOSCOPY WITH PROPOFOL ;  Surgeon: Jerlean Mood, MD;  Location: [facility] ENDOSCOPY;  Service: Endoscopy;  Laterality: N/A;   COLONOSCOPY WITH PROPOFOL  N/A 12/23/2021   Procedure: COLONOSCOPY WITH PROPOFOL ;  Surgeon: Selena Daily, MD;  Location: [facility] ENDOSCOPY;  Service: Gastroenterology;  Laterality: N/A;   DILATATION & CURETTAGE/HYSTEROSCOPY WITH MYOSURE N/A 07/12/2018   Procedure: DILATATION & CURETTAGE/HYSTEROSCOPY WITH MYOSURE WITH ENDOMETRIAL POLYPECTOMY;  Surgeon: Kris Pester, MD;  Location: [facility] ORS;  Service:  Gynecology;  Laterality: N/A;   DILATION AND CURETTAGE OF UTERUS  1983   ESOPHAGOGASTRODUODENOSCOPY N/A 12/23/2021   Procedure: ESOPHAGOGASTRODUODENOSCOPY (EGD);  Surgeon: Selena Daily, MD;  Location: [facility] ENDOSCOPY;  Service: Gastroenterology;  Laterality: N/A;   JOINT REPLACEMENT Left 11/23/2015   right  2023   KNEE ARTHROPLASTY Right 03/06/2022   Procedure: COMPUTER ASSISTED TOTAL KNEE ARTHROPLASTY;  Surgeon: Arlyne Lame, MD;  Location: [facility] ORS;  Service: Orthopedics;  Laterality: Right;   LAPAROSCOPIC BILATERAL SALPINGO OOPHERECTOMY N/A 08/04/2023   Procedure: LAPAROSCOPIC BILATERAL SALPINGO OOPHORECTOMY;  Surgeon: Hermine Loots, MD;  Location: [facility] ORS;  Service: Gynecology;  Laterality: N/A;   POLYPECTOMY  1989   PORTA CATH REMOVAL     PORTACATH PLACEMENT  2015   SKIN BIOPSY     on scalp   SPINE SURGERY  09/14/2014   Ruptured disk L4- L5   TOTAL KNEE ARTHROPLASTY Left 11/22/2015   Procedure: TOTAL KNEE ARTHROPLASTY;  Surgeon: Marlena Sima, MD;  Location: [facility];  Service: Orthopedics;  Laterality: Left;   TUBAL LIGATION     WISDOM TOOTH EXTRACTION  2006    FAMILY HISTORY Family History  Problem Relation Age of Onset   Lung cancer Mother 62   COPD Mother  Colon cancer Father 77   Stomach cancer Father 60   Hypertension Sister    Breast cancer Maternal Aunt        dx over 32   Breast cancer Paternal Aunt        dx 24s   Breast cancer Paternal Aunt        dx 10s   Rectal cancer Paternal Uncle        dx 37s   Rectal cancer Paternal Uncle        dx 52s   Diabetes Maternal Grandmother    Pancreatic cancer Paternal Grandfather    Rectal cancer Cousin        dx 10s-60s   Rectal cancer Cousin        dx >50    GYNECOLOGIC HISTORY:  Patient's last menstrual period was 12/22/1999 (approximate).     ADVANCED DIRECTIVES:    HEALTH MAINTENANCE: Social History   Tobacco Use   Smoking status: Former    Current packs/day: 0.00    Average packs/day: 1  pack/day for 30.0 years (30.0 ttl pk-yrs)    Types: Cigarettes    Start date: 12/12/1983    Quit date: 12/11/2013    Years since quitting: 10.3   Smokeless tobacco: Former  Building services engineer status: Never Used  Substance Use Topics   Alcohol use: Not Currently   Drug use: No     Colonoscopy:  PAP:  Bone density:  Mammogram:  No Known Allergies  Current Outpatient Medications  Medication Sig Dispense Refill   acetaminophen  (TYLENOL ) 500 MG tablet Take 1,000 mg by mouth 2 (two) times daily as needed for moderate pain or headache.     baclofen  (LIORESAL ) 10 MG tablet Take 1 tablet (10 mg total) by mouth 3 (three) times daily as needed for muscle spasms. 270 tablet 1   calcium  carbonate (OSCAL) 1500 (600 Ca) MG TABS tablet Take by mouth 2 (two) times daily with a meal.     dapagliflozin propanediol (FARXIGA) 10 MG TABS tablet Take 10 mg by mouth daily.     diclofenac  Sodium (VOLTAREN ) 1 % GEL Apply 2 g topically 3 (three) times daily as needed. 100 g 2   ezetimibe  (ZETIA ) 10 MG tablet Take 1 tablet (10 mg total) by mouth daily. 90 tablet 3   fluticasone  (FLONASE ) 50 MCG/ACT nasal spray PLACE 2 SPRAYS INTO BOTH NOSTRILS AT BEDTIME. 48 g 1   fulvestrant  (FASLODEX ) 250 MG/5ML injection Inject 500 mg into the muscle every 30 (thirty) days. One injection each buttock over 1-2 minutes. Warm prior to use.     Krill Oil 1000 MG CAPS Take 1,000 mg by mouth daily.     lisinopril  (ZESTRIL ) 5 MG tablet Take 1 tablet (5 mg total) by mouth daily. 90 tablet 3   loperamide  (IMODIUM ) 2 MG capsule TAKE 2 TABLETS (4 MG TOTAL) BY MOUTH 4 (FOUR) TIMES DAILY AS NEEDED FOR DIARRHEA OR LOOSE STOOLS. 30 capsule 1   metFORMIN  (GLUCOPHAGE ) 500 MG tablet Take 1 tablet (500 mg total) by mouth 2 (two) times daily with a meal. 180 tablet 3   metoprolol  tartrate (LOPRESSOR ) 25 MG tablet TAKE 1/2 TABLET TWICE A DAY BY MOUTH 90 tablet 3   omeprazole  (PRILOSEC) 40 MG capsule TAKE 1 CAPSULE BY MOUTH TWICE A DAY 180  capsule 1   ondansetron  (ZOFRAN ) 8 MG tablet Take 1 tablet (8 mg total) by mouth every 8 (eight) hours as needed for nausea or vomiting. 20 tablet 0  prochlorperazine  (COMPAZINE ) 10 MG tablet Take 1 tablet (10 mg total) by mouth every 6 (six) hours as needed for nausea or vomiting. 20 tablet 1   ribociclib  succ (KISQALI , 600 MG DOSE,) 200 MG Therapy Pack Take 3 tablets (600 mg total) by mouth daily. Take for 21 days on, 7 days off, repeat every 28 days. 63 tablet 5   sodium chloride  (OCEAN) 0.65 % SOLN nasal spray Place 1 spray into both nostrils as needed for congestion.     traZODone  (DESYREL ) 50 MG tablet TAKE 1 TABLET (50 MG TOTAL) BY MOUTH AT BEDTIME AS NEEDED. FOR SLEEP 90 tablet 1   venlafaxine  XR (EFFEXOR -XR) 75 MG 24 hr capsule Take 1 capsule (75 mg total) by mouth daily with breakfast. 90 capsule 3   vitamin B-12 (CYANOCOBALAMIN ) 1000 MCG tablet Take 2,500 mcg by mouth every Monday, Wednesday, and Friday.     Vitamin D , Cholecalciferol , 50 MCG (2000 UT) CAPS Take 2,000 Units by mouth every Monday, Wednesday, and Friday.     No current facility-administered medications for this visit.    OBJECTIVE: BP 128/64 (BP Location: Left Arm, Patient Position: Sitting, Cuff Size: Normal)   Pulse 91   Temp 97.9 F (36.6 C) (Tympanic)   Resp 16   Ht 5' 5.5" (1.664 m)   Wt 204 lb 4.8 oz (92.7 kg)   LMP 12/22/1999 (Approximate)   SpO2 99%   BMI 33.48 kg/m    Body mass index is 33.48 kg/m.    ECOG FS:0 - Asymptomatic  General: Well-developed, well-nourished, no acute distress. Eyes: Pink conjunctiva, anicteric sclera. HEENT: Normocephalic, moist mucous membranes. Lungs: No audible wheezing or coughing. Heart: Regular rate and rhythm. Abdomen: Soft, nontender, no obvious distention. Musculoskeletal: No edema, cyanosis, or clubbing. Neuro: Alert, answering all questions appropriately. Cranial nerves grossly intact. Skin: No rashes or petechiae noted. Psych: Normal affect.  LAB  RESULTS:  Appointment on 04/27/2024  Component Date Value Ref Range Status   WBC Count 04/27/2024 4.0  4.0 - 10.5 K/uL Final   RBC 04/27/2024 2.58 (L)  3.87 - 5.11 MIL/uL Final   Hemoglobin 04/27/2024 9.5 (L)  12.0 - 15.0 g/dL Final   HCT 40/98/1191 28.5 (L)  36.0 - 46.0 % Final   MCV 04/27/2024 110.5 (H)  80.0 - 100.0 fL Final   MCH 04/27/2024 36.8 (H)  26.0 - 34.0 pg Final   MCHC 04/27/2024 33.3  30.0 - 36.0 g/dL Final   RDW 47/82/9562 14.6  11.5 - 15.5 % Final   Platelet Count 04/27/2024 153  150 - 400 K/uL Final   nRBC 04/27/2024 0.0  0.0 - 0.2 % Final   Neutrophils Relative % 04/27/2024 47  % Final   Neutro Abs 04/27/2024 1.9  1.7 - 7.7 K/uL Final   Lymphocytes Relative 04/27/2024 30  % Final   Lymphs Abs 04/27/2024 1.2  0.7 - 4.0 K/uL Final   Monocytes Relative 04/27/2024 17  % Final   Monocytes Absolute 04/27/2024 0.7  0.1 - 1.0 K/uL Final   Eosinophils Relative 04/27/2024 3  % Final   Eosinophils Absolute 04/27/2024 0.1  0.0 - 0.5 K/uL Final   Basophils Relative 04/27/2024 2  % Final   Basophils Absolute 04/27/2024 0.1  0.0 - 0.1 K/uL Final   Immature Granulocytes 04/27/2024 1  % Final   Abs Immature Granulocytes 04/27/2024 0.02  0.00 - 0.07 K/uL Final   Performed at [facility], [address], [place], [place] [address]   Sodium 04/27/2024 142  135 - 145 mmol/L Final   Potassium 04/27/2024 4.3  3.5 - 5.1 mmol/L Final   Chloride 04/27/2024 107  98 - 111 mmol/L Final   CO2 04/27/2024 23  22 - 32 mmol/L Final   Glucose, Bld 04/27/2024 124 (H)  70 - 99 mg/dL Final   Glucose reference range applies only to samples taken after fasting for at least 8 hours.   BUN 04/27/2024 28 (H)  8 - 23 mg/dL Final   Creatinine 08/65/7846 1.73 (H)  0.44 - 1.00 mg/dL Final   Calcium  04/27/2024 9.4  8.9 - 10.3 mg/dL Final   Total Protein 96/29/5284 7.2  6.5 - 8.1 g/dL Final   Albumin 13/24/4010 3.8  3.5 - 5.0 g/dL Final   AST 27/25/3664 33  15 - 41 U/L Final   ALT 04/27/2024 26  0 -  44 U/L Final   Alkaline Phosphatase 04/27/2024 77  38 - 126 U/L Final   Total Bilirubin 04/27/2024 0.6  0.0 - 1.2 mg/dL Final   GFR, Estimated 04/27/2024 31 (L)  >60 mL/min Final   Comment: (NOTE) Calculated using the CKD-EPI Creatinine Equation (2021)    Anion gap 04/27/2024 12  5 - 15 Final   Performed at [facility], [address] [place], [place] [address]    STUDIES: No results found.  ONCOLOGY HISTORY:  Bilateral adenocarcinoma of the breast. Patient is status post bilateral mastectomy in February 2015. She also received adjuvant chemotherapy with Adriamycin, Cytoxan, and Taxol completing treatment in approximately October 2015. She then initiated letrozole, but could not tolerate secondary to leg pain and was switched to Aromasin .  Patient then switched to tamoxifen  in October 2019 secondary to cost.  Patient subsequently discontinued tamoxifen  and did not complete the recommended 5 years of treatment.   ASSESSMENT: Recurrent, metastatic ER/PR, HER2 negative invasive carcinoma of the breast.   PLAN:    Recurrent, metastatic ER/PR, HER2 negative invasive carcinoma of the breast: See oncology history as above.  Biopsy confirmed recurrent disease.  PET scan results from December 26, 2021 reviewed independently with hypermetabolic left supraclavicular, subpectoral, and axillary lymphadenopathy consistent with nodal recurrence of patient's history of breast cancer.  No distant metastases were identified.  Her most recent PET scan on December 01, 2023 reviewed independently without evidence of recurrent or metastatic disease.  Her initial CA 27-29 initially was 260.1.  Now seems to have plateaued between 74.3 and 98.5.  Her most recent result was 78.6.  Today's result is pending.  Of note, patient also had metastatic breast cancer in her surgical specimen from her oophorectomy.  Given patient's increased nausea and vomiting, she has requested to dose reduce her Kisqali  to 400 mg 21 days  with 7 days off.  Proceed with fulvestrant  today.  Return to clinic in 4 weeks for laboratory work and evaluation by clinical pharmacy.  Patient will then return to clinic in 8 weeks for laboratory work, further evaluation, and continuation of treatment.  Appreciate clinical pharmacy input.   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  Follow-up with gynecology oncology as scheduled. Multiple sclerosis: Chronic and unchanged.  Continue evaluation and treatment per neurology.  Recent MRI of the brain did not reveal any new lesions. Renal insufficiency: Patient's GFR is 31 today.  Dose reduce Kisqali  as above.  Monitor if GFR remains persistently less than 30. Anemia: Patient's hemoglobin has trended down slightly to 9.5. Macrocytosis: Likely secondary to underlying treatments. Reflux: Patient does not complain of this  today.  Continue omeprazole .   Nausea and vomiting: Patient was given a refill of ondansetron  today.  Patient expressed understanding and was in agreement with this plan. She also understands that She can call clinic at any time with any questions, concerns, or complaints.   Breast cancer bilateral, multifocal ER/PR pos, Her 2 neg.   Staging form: Breast, AJCC 7th Edition     Clinical: Stage IIB (T2, N1, M0) - Signed by Malone Sear, MD on 04/22/2015   Shellie Dials, MD   04/27/2024 11:09 AM

## 2024-04-27 NOTE — Progress Notes (Signed)
 Oral Chemotherapy Clinic [facility]  [phone] [phone]  Patient Care Team: Raina Bunting, DO as PCP - General (Family Medicine) Wenona Hamilton, MD as PCP - Cardiology (Cardiology) Merriam Abbey, DO as Consulting Physician (Neurology) Shellie Dials, MD as Consulting Physician (Oncology) Rochell Chroman, RN as Oncology Nurse Navigator Julia Oats, [place] (Optometry)   Name of the patient: Cassidy Norris  [id number]  Apr 11, 1951   Date of visit: 04/27/24  HPI: Patient is a 73 y.o. female with recurrent breast cancer, ER/PR positive/HER2 negative. Currently treating with Kisqali  (ribociclib ) and fulvestrant . She started ribociclib  on 01/08/22. Her treatment was held for surgery which was completed on 08/04/23. Patient resumed her ribociclib  on 08/19/23. Patient was dosed reduced to 400mg  dosing on 04/27/24 due to fatigue.  Reason for Consult: Oral chemotherapy follow-up for ribociclib  therapy.   PAST MEDICAL HISTORY: Past Medical History:  Diagnosis Date   Actinic keratosis    Anemia    Arthritis not really diagnosis but have pain   B12 deficiency 07/23/2016   Will check levels to determine if injections are needed again. Await results. Recent CBC at Onc WNL   Blood transfusion without reported diagnosis 1983 had a miscarriage/dnc   Cancer (HCC) 12/30/2013   Multifocal disease: pT1c,m, N0(isolated tumor cells) Er/ PR positive, Her 2 negative.   Cancer (HCC) 01/09/2014   Invasive lobular carcinoma, 2.2 cm, T2,N1 ER/PR positive, HER-2/neu negative   Cataract 2014?   Centrilobular emphysema (HCC) 10/27/2021   CKD (chronic kidney disease), stage IV (HCC) 07/13/2023   Complication of anesthesia 03/06/2022   Prolonged metabolism of Rocuronium  during case.   Diabetes mellitus without complication (HCC)    Diffuse cystic mastopathy    Erosive esophagitis    Essential hypertension, benign 09/24/2017   Family history of  malignant neoplasm of gastrointestinal tract 2012   Fractured elbow 08/28/2014   Gastroesophageal reflux disease    Hx of metabolic acidosis with increased anion gap    Increased heart rate    Multiple sclerosis (HCC)    Neuromuscular disorder (HCC)    neuropathy in bil feet - left is worse.   Obesity, unspecified    Peripheral neuropathy    Personal history of tobacco use, presenting hazards to health    Restless leg syndrome    Sleep apnea    uses CPAP   Special screening for malignant neoplasms, colon    Squamous cell carcinoma of skin 08/24/2022   SCCIS - scalp, ED&C   Squamous cell carcinoma of skin 07/20/2023   SCC IS right of midline ant scalp post, tx with ED&C   Squamous cell carcinoma of skin 07/20/2023   SCC IS right of midline ant scalp ant, tx with [facility]    HEMATOLOGY/ONCOLOGY HISTORY:  Oncology History Overview Note  1. Bilateral carcinoma of breast, February, 2015. Right breast status post radical mastectomy. T1 C. N0M0 (isolated tumor cells in one lymph node) multifocal invasive cancer. Stage IC, Left breast, Status post radical mastectomy, T2, N1, M0 tumor. Stage II, RIght breast. Both tumors are estrogen receptor positive.  Progesterone receptor positive.  HER-2 receptor negative by FISH. Negative for BRCA mutation.  2. Started on Adriamycin and Cytoxan on March 01, 2014. Last cycle of Cytoxan and Adriamycin on May 01, 2014. Patient has finished last chemotherapy on September 9. (12 th dose was omitted because of neuropathy) 3.  Taking tamoxifen .  Patient had a poor tolerance to letrozole.(October, 2015) 4.  Patient is taking letrozole  since November of 2015 5.  May, 2016 Letrozole has been put on hold because of bony pains 6.  Started on Aromasin  from July of 2016    History of bilateral breast cancer (Resolved)  11/29/2013 Initial Diagnosis   Breast cancer bilateral, multifocal ER/PR pos, Her 2 neg,.     ALLERGIES:  has no known allergies.  MEDICATIONS:   Current Outpatient Medications  Medication Sig Dispense Refill   acetaminophen  (TYLENOL ) 500 MG tablet Take 1,000 mg by mouth 2 (two) times daily as needed for moderate pain or headache.     baclofen  (LIORESAL ) 10 MG tablet Take 1 tablet (10 mg total) by mouth 3 (three) times daily as needed for muscle spasms. 270 tablet 1   calcium  carbonate (OSCAL) 1500 (600 Ca) MG TABS tablet Take by mouth 2 (two) times daily with a meal.     dapagliflozin propanediol (FARXIGA) 10 MG TABS tablet Take 10 mg by mouth daily.     diclofenac  Sodium (VOLTAREN ) 1 % GEL Apply 2 g topically 3 (three) times daily as needed. 100 g 2   ezetimibe  (ZETIA ) 10 MG tablet Take 1 tablet (10 mg total) by mouth daily. 90 tablet 3   fluticasone  (FLONASE ) 50 MCG/ACT nasal spray PLACE 2 SPRAYS INTO BOTH NOSTRILS AT BEDTIME. 48 g 1   fulvestrant  (FASLODEX ) 250 MG/5ML injection Inject 500 mg into the muscle every 30 (thirty) days. One injection each buttock over 1-2 minutes. Warm prior to use.     Krill Oil 1000 MG CAPS Take 1,000 mg by mouth daily.     lisinopril  (ZESTRIL ) 5 MG tablet Take 1 tablet (5 mg total) by mouth daily. 90 tablet 3   loperamide  (IMODIUM ) 2 MG capsule TAKE 2 TABLETS (4 MG TOTAL) BY MOUTH 4 (FOUR) TIMES DAILY AS NEEDED FOR DIARRHEA OR LOOSE STOOLS. 30 capsule 1   metFORMIN  (GLUCOPHAGE ) 500 MG tablet Take 1 tablet (500 mg total) by mouth 2 (two) times daily with a meal. 180 tablet 3   metoprolol  tartrate (LOPRESSOR ) 25 MG tablet TAKE 1/2 TABLET TWICE A DAY BY MOUTH 90 tablet 3   omeprazole  (PRILOSEC) 40 MG capsule TAKE 1 CAPSULE BY MOUTH TWICE A DAY 180 capsule 1   ondansetron  (ZOFRAN ) 8 MG tablet Take 1 tablet (8 mg total) by mouth every 8 (eight) hours as needed for nausea or vomiting. 20 tablet 0   prochlorperazine  (COMPAZINE ) 10 MG tablet Take 1 tablet (10 mg total) by mouth every 6 (six) hours as needed for nausea or vomiting. 20 tablet 1   ribociclib  succ (KISQALI , 600 MG DOSE,) 200 MG Therapy Pack Take 3  tablets (600 mg total) by mouth daily. Take for 21 days on, 7 days off, repeat every 28 days. 63 tablet 5   sodium chloride  (OCEAN) 0.65 % SOLN nasal spray Place 1 spray into both nostrils as needed for congestion.     traZODone  (DESYREL ) 50 MG tablet TAKE 1 TABLET (50 MG TOTAL) BY MOUTH AT BEDTIME AS NEEDED. FOR SLEEP 90 tablet 1   venlafaxine  XR (EFFEXOR -XR) 75 MG 24 hr capsule Take 1 capsule (75 mg total) by mouth daily with breakfast. 90 capsule 3   vitamin B-12 (CYANOCOBALAMIN ) 1000 MCG tablet Take 2,500 mcg by mouth every Monday, Wednesday, and Friday.     Vitamin D , Cholecalciferol , 50 MCG (2000 UT) CAPS Take 2,000 Units by mouth every Monday, Wednesday, and Friday.     No current facility-administered medications for this visit.    VITAL SIGNS: LMP 12/22/1999 (  Approximate)  There were no vitals filed for this visit.     Estimated body mass index is 33.48 kg/m as calculated from the following:   Height as of an earlier encounter on 04/27/24: 5' 5.5" (1.664 m).   Weight as of an earlier encounter on 04/27/24: 92.7 kg (204 lb 4.8 oz).  LABS: CBC:    Component Value Date/Time   WBC 4.0 04/27/2024 1008   WBC 4.0 03/30/2024 0950   HGB 9.5 (L) 04/27/2024 1008   HGB 15.5 07/13/2017 0937   HCT 28.5 (L) 04/27/2024 1008   HCT 46.7 (H) 07/13/2017 0937   PLT 153 04/27/2024 1008   PLT 211 07/13/2017 0937   MCV 110.5 (H) 04/27/2024 1008   MCV 94 07/13/2017 0937   MCV 93 03/11/2015 1356   NEUTROABS 1.9 04/27/2024 1008   NEUTROABS 5.4 07/13/2017 0937   NEUTROABS 5.6 03/11/2015 1356   LYMPHSABS 1.2 04/27/2024 1008   LYMPHSABS 2.1 07/13/2017 0937   LYMPHSABS 2.3 03/11/2015 1356   MONOABS 0.7 04/27/2024 1008   MONOABS 1.0 (H) 03/11/2015 1356   EOSABS 0.1 04/27/2024 1008   EOSABS 0.2 07/13/2017 0937   EOSABS 0.3 03/11/2015 1356   BASOSABS 0.1 04/27/2024 1008   BASOSABS 0.0 07/13/2017 0937   BASOSABS 0.1 03/11/2015 1356   Comprehensive Metabolic Panel:    Component Value Date/Time    NA 142 04/27/2024 1009   NA 142 07/14/2019 1516   NA 139 03/11/2015 1356   K 4.3 04/27/2024 1009   K 3.4 (L) 03/11/2015 1356   CL 107 04/27/2024 1009   CL 103 03/11/2015 1356   CO2 23 04/27/2024 1009   CO2 28 03/11/2015 1356   BUN 28 (H) 04/27/2024 1009   BUN 12 07/14/2019 1516   BUN 13 03/11/2015 1356   CREATININE 1.73 (H) 04/27/2024 1009   CREATININE 0.85 10/20/2021 0838   GLUCOSE 124 (H) 04/27/2024 1009   GLUCOSE 118 (H) 03/11/2015 1356   CALCIUM  9.4 04/27/2024 1009   CALCIUM  9.2 03/11/2015 1356   AST 33 04/27/2024 1009   ALT 26 04/27/2024 1009   ALT 32 03/11/2015 1356   ALKPHOS 77 04/27/2024 1009   ALKPHOS 68 03/11/2015 1356   BILITOT 0.6 04/27/2024 1009   PROT 7.2 04/27/2024 1009   PROT 6.9 07/14/2019 1516   PROT 6.9 03/11/2015 1356   ALBUMIN 3.8 04/27/2024 1009   ALBUMIN 4.5 07/14/2019 1516   ALBUMIN 4.2 03/11/2015 1356     Present during today's visit: patient only   Assessment and Plan: CBC/CMP reviewed, labs unchanged (note: ca27.29 is still pending)  Patient is requesting a dose reduction down to 400mg  21on/7off due to recent fatigue and nausea Patient will dose decrease with the supply of tablets she has on hand currently. Reviewed that she will now take two 200mg  tablets instead of three 200mg  tablets Patient received fulvestrant  today    Oral Chemotherapy Side Effect/Intolerance:  Nausea: patient reporting nausea and occasionally vomiting over the last 4 weeks or so. It responds to antiemetics. She is currently taking one ondansetron  8mg  daily to control her nausea.  She knows that is she starts to use the ondansetron  more frequently she should let us  know so that her QTc can be monitored Suggested she take her antiemetic prior to her ribociclib  instead of waiting for it to occur then taking the antiemetic Fatigue: increased over the last month, will monitor improved with the decreasing ribociclib  dose No side effects reported  Oral Chemotherapy  Adherence: No missed doses reported  this month There are no patient barriers to medication adherence identified.   New medications: None reported  Medication Access Issues: No issues, receives Health and safety inspector and fill at [employer] (Specialty). Updated rx for 400mg  dosing sent to pharmacy for next refill  Patient expressed understanding and was in agreement with this plan. She also understands that She can call clinic at any time with any questions, concerns, or complaints.   Follow-up plan: RTC in 4 weeks.   Thank you for allowing me to participate in the care of this very pleasant patient.   Time Total: 15 minutes  Visit consisted of counseling and education on dealing with issues of symptom management in the setting of serious and potentially life-threatening illness.Greater than 50%  of this time was spent counseling and coordinating care related to the above assessment and plan.  Signed by: Nalani Andreen N. Nahomy Limburg, PharmD, Lorraine Roses, CPP Hematology/Oncology Clinical Pharmacist Practitioner [facility] [phone]  04/27/2024 2:11 PM

## 2024-04-27 NOTE — Progress Notes (Signed)
 Wants to talk about decreasing the dose of Kisqali . States she has not been feeling well and has had more nausea and vomiting.

## 2024-04-28 LAB — CANCER ANTIGEN 27.29: CA 27.29: 65 U/mL — ABNORMAL HIGH (ref 0.0–38.6)

## 2024-04-30 ENCOUNTER — Encounter: Payer: Self-pay | Admitting: Family Medicine

## 2024-05-02 ENCOUNTER — Encounter: Payer: Self-pay | Admitting: Family Medicine

## 2024-05-02 ENCOUNTER — Ambulatory Visit (INDEPENDENT_AMBULATORY_CARE_PROVIDER_SITE_OTHER): Payer: Self-pay | Admitting: Family Medicine

## 2024-05-02 VITALS — BP 110/70 | HR 90 | Ht 65.5 in | Wt 203.0 lb

## 2024-05-02 DIAGNOSIS — I700 Atherosclerosis of aorta: Secondary | ICD-10-CM

## 2024-05-02 DIAGNOSIS — E785 Hyperlipidemia, unspecified: Secondary | ICD-10-CM | POA: Diagnosis not present

## 2024-05-02 DIAGNOSIS — K219 Gastro-esophageal reflux disease without esophagitis: Secondary | ICD-10-CM | POA: Diagnosis not present

## 2024-05-02 DIAGNOSIS — E1169 Type 2 diabetes mellitus with other specified complication: Secondary | ICD-10-CM | POA: Diagnosis not present

## 2024-05-02 DIAGNOSIS — Z7984 Long term (current) use of oral hypoglycemic drugs: Secondary | ICD-10-CM | POA: Diagnosis not present

## 2024-05-02 DIAGNOSIS — G720 Drug-induced myopathy: Secondary | ICD-10-CM

## 2024-05-02 DIAGNOSIS — R142 Eructation: Secondary | ICD-10-CM

## 2024-05-02 LAB — POCT GLYCOSYLATED HEMOGLOBIN (HGB A1C): Hemoglobin A1C: 6 % — AB (ref 4.0–5.6)

## 2024-05-02 NOTE — Progress Notes (Unsigned)
 Subjective:    Patient ID: Cassidy Norris, female    DOB: [birth year], 73 y.o.   MRN: [id number]  Cassidy Norris is a 73 y.o. female presenting on 05/02/2024 for No chief complaint on file.   HPI  The patient's recent blood work revealed high triglycerides, despite fasting prior to the test. They speculated that their elevated levels might be due to their Thanksgiving diet. Their blood sugar level had slightly increased from 5.8 to 6.0, but was still within the controlled diabetes range. The patient expressed concern about the cost of their medications, particularly Farxiga, which they may not be able to afford next year.   The patient also reported a persistent cough and a runny nose, which they attributed to allergies. They had been experiencing these symptoms since helping clean a house that had been sitting for a long time. Despite these symptoms, their lungs sounded clear during the examination.   Recurrent, metastatic ER/PR, HER2 negative Breast Cancer Followed by Dr Adrian Alba, [facility] Oncology PET Scan from Feb 2023, and biopsy proven recurrent disease Initial CA 27-29 260.1, now stable between 86-91 On oral chemotherapy Kisqali    Anemia, chronic stable unchanged Hgb 9.5   Hypertension Creatinine CKD-IV Followed by Oncology - on chemotherapy Followed by Nephrology [facility] Dr Zelda Hickman Cr 1.76   Upcoming visit with Cardiology, BP down and HR is up   Right Knee Right knee incision with some issues with healing now improved, had small cystlike swelling   CHRONIC DM, Type 2: A1c 6.0 CBG Meds: Metformin  500 BID Reports good compliance. Tolerating well w/o side-effects Currently on ACEi Lifestyle: ***Improving diet  - Diet (admits starches were increasing her sugars now improving)  Denies hypoglycemia, polyuria, visual changes, numbness or tingling. Upcoming Diabetic Eye Exam 11/26/23   Cataracts Followed by Eye Doctor   HYPERLIPIDEMIA: - Reports concerns with significantly  elevated TG 700, unable to read LDL 09/2023 On Krill Oil, not on statin due to myopathy    Chemotherapy Induced Neuropathy Chronic problem with nerve pain Previous treatment not effective   Insomnia Controlled on Trazodone  50mg  nightly, refills due today  ***GERD Increased belching gas production in morning Omeprazole  40mg  TWICE A DAY   Health Maintenance: ***     05/02/2024    8:58 AM 11/02/2023    9:24 AM 09/28/2023    2:30 PM  Depression screen PHQ 2/9  Decreased Interest 0 0 1  Down, Depressed, Hopeless 0 0 0  PHQ - 2 Score 0 0 1  Altered sleeping 3 2 0  Tired, decreased energy 3 2 1   Change in appetite 2 0 1  Feeling bad or failure about yourself  0 0 0  Trouble concentrating 1 0 0  Moving slowly or fidgety/restless 0 0 0  Suicidal thoughts 0 0 0  PHQ-9 Score 9 4 3   Difficult doing work/chores Not difficult at all  Not difficult at all       05/02/2024    8:59 AM 11/02/2023    9:24 AM 09/28/2023    2:30 PM 07/13/2023    8:40 AM  GAD 7 : Generalized Anxiety Score  Nervous, Anxious, on Edge 0 0 0 1  Control/stop worrying 0 0 0 1  Worry too much - different things 0 0 0 0  Trouble relaxing 0 0 0 0  Restless 0 0 0 0  Easily annoyed or irritable 0 1 1 1   Afraid - awful might happen 0 0 0 0  Total GAD 7 Score  0 1 1 3   Anxiety Difficulty    Not difficult at all    Social History   Tobacco Use   Smoking status: Former    Current packs/day: 0.00    Average packs/day: 1 pack/day for 30.0 years (30.0 ttl pk-yrs)    Types: Cigarettes    Start date: 12/12/1983    Quit date: 12/11/2013    Years since quitting: 10.3   Smokeless tobacco: Former  Building services engineer status: Never Used  Substance Use Topics   Alcohol use: Not Currently   Drug use: No    Review of Systems Per HPI unless specifically indicated above     Objective:     BP 110/70 (BP Location: Right Arm, Patient Position: Sitting, Cuff Size: Normal)   Pulse 90   Ht 5' 5.5" (1.664 m)   Wt 203  lb (92.1 kg)   LMP 12/22/1999 (Approximate)   SpO2 97%   BMI 33.27 kg/m   Wt Readings from Last 3 Encounters:  05/02/24 203 lb (92.1 kg)  04/27/24 204 lb 4.8 oz (92.7 kg)  03/30/24 203 lb 11.2 oz (92.4 kg)    Physical Exam  I have personally reviewed the radiology report from 02/21/24 on LDCT.  CLINICAL DATA:  73 year old female former smoker with 30 pack-year smoking history, quit smoking 2015.   EXAM: CT CHEST WITHOUT CONTRAST LOW-DOSE FOR LUNG CANCER SCREENING   TECHNIQUE: Multidetector CT imaging of the chest was performed following the standard protocol without IV contrast.   RADIATION DOSE REDUCTION: This exam was performed according to the departmental dose-optimization program which includes automated exposure control, adjustment of the mA and/or kV according to patient size and/or use of iterative reconstruction technique.   COMPARISON:  07/02/2020 screening chest CT   FINDINGS: Cardiovascular: Normal heart size. Stable small pericardial effusion anteriorly. Atherosclerotic nonaneurysmal thoracic aorta. Normal caliber pulmonary arteries.   Mediastinum/Nodes: No significant thyroid  nodules. Unremarkable esophagus. No pathologically enlarged axillary, mediastinal or hilar lymph nodes, noting limited sensitivity for the detection of hilar adenopathy on this noncontrast study.   Lungs/Pleura: No pneumothorax. No pleural effusion. Mild centrilobular emphysema with diffuse bronchial wall thickening. No acute consolidative airspace disease or lung masses. Stable tiny scattered solid pulmonary nodules up to the 1.7 mm in the medial left lower lobe on series 3/image 142. no new significant pulmonary nodules.   Upper abdomen: Stable simple 1.1 cm anterior liver cyst. Cholecystectomy. Isodense 4.0 cm exophytic upper left renal cortical lesion (series 2/image 50), stable size and increased density since 12/03/2021 CT, favoring a benign Bosniak category  2 hemorrhagic/proteinaceous renal cyst.   Musculoskeletal: No aggressive appearing focal osseous lesions. Stable dense sclerotic T7 and T10 vertebral small lesions favoring bone islands. Moderate thoracic spondylosis. Bilateral mastectomy.   IMPRESSION: 1. Lung-RADS 2, benign appearance or behavior. Continue annual screening with low-dose chest CT without contrast in 12 months. 2. Stable small pericardial effusion anteriorly. 3. Aortic Atherosclerosis (ICD10-I70.0) and Emphysema (ICD10-J43.9).     Electronically Signed   By: Levell Reach M.D.   On: 03/25/2024 15:56  Results for orders placed or performed in visit on 05/02/24  POCT HgB A1C   Collection Time: 05/02/24  9:19 AM  Result Value Ref Range   Hemoglobin A1C 6.0 (A) 4.0 - 5.6 %   HbA1c POC (<> result, manual entry)     HbA1c, POC (prediabetic range)     HbA1c, POC (controlled diabetic range)        Assessment & Plan:  Problem List Items Addressed This Visit     Type 2 diabetes mellitus with other specified complication (HCC) - Primary   Relevant Orders   POCT HgB A1C (Completed)     ***  Orders Placed This Encounter  Procedures   POCT HgB A1C    No orders of the defined types were placed in this encounter.   Follow up plan: No follow-ups on file.  Future labs ordered for *** Lipids included   Domingo Friend, DO [facility] 05/02/2024, 9:26 AM

## 2024-05-02 NOTE — Patient Instructions (Addendum)
 Thank you for coming to the office today.   Recent Labs    10/25/23 0802 05/02/24 0919  HGBA1C 6.0* 6.0*    CT Scan shows a complex cyst on kidney LEFT side. It appears to benign and not harmful or cancerous. It has been monitored over past several years. It is a decent size, but I don't think it is related to your kidney function. - Okay to share this with Dr Zelda Hickman next visit  Keep an eye on acid reflux and belching / gas production. Keep on Omeprazole  40mg  twice a day, okay to take the Gas-X if needed.  Consider IBgard OTC Peppermint Oil (Triple Coated Capsule) 180mg  take one 3 times daily to reduce diarrhea   Please schedule a Follow-up Appointment to: Return for 3 month fasting lab > 1 week later Annual Physical.  If you have any other questions or concerns, please feel free to call the office or send a message through MyChart. You may also schedule an earlier appointment if necessary.  Additionally, you may be receiving a survey about your experience at our office within a few days to 1 week by e-mail or mail. We value your feedback.  Domingo Friend, DO [facility], [place]

## 2024-05-03 ENCOUNTER — Other Ambulatory Visit: Payer: Self-pay | Admitting: Family Medicine

## 2024-05-03 DIAGNOSIS — Z0000 Encounter for general adult medical examination without abnormal findings: Secondary | ICD-10-CM

## 2024-05-03 DIAGNOSIS — J432 Centrilobular emphysema: Secondary | ICD-10-CM

## 2024-05-03 DIAGNOSIS — G35 Multiple sclerosis: Secondary | ICD-10-CM

## 2024-05-03 DIAGNOSIS — Z8639 Personal history of other endocrine, nutritional and metabolic disease: Secondary | ICD-10-CM

## 2024-05-03 DIAGNOSIS — E559 Vitamin D deficiency, unspecified: Secondary | ICD-10-CM

## 2024-05-03 DIAGNOSIS — I700 Atherosclerosis of aorta: Secondary | ICD-10-CM

## 2024-05-03 DIAGNOSIS — E1169 Type 2 diabetes mellitus with other specified complication: Secondary | ICD-10-CM

## 2024-05-10 ENCOUNTER — Other Ambulatory Visit: Payer: Self-pay

## 2024-05-10 NOTE — Progress Notes (Signed)
 Specialty Pharmacy Ongoing Clinical Assessment Note  Cassidy Norris is a 73 y.o. female who is being followed by the specialty pharmacy service for RxSp Oncology   Patient's specialty medication(s) reviewed today: Ribociclib  Succinate (KISQALI  400mg  Daily Dose)   Missed doses in the last 4 weeks: 0   Patient/Caregiver did not have any additional questions or concerns.   Therapeutic benefit summary: Patient is achieving benefit   Adverse events/side effects summary: No adverse events/side effects (dose decreased to 400 mg and tolerating much better.)   Patient's therapy is appropriate to: Continue    Goals Addressed             This Visit's Progress    Slow Disease Progression   On track    Patient is on track. Patient will maintain adherence.  CA 27.29 has decreased to 65 as of 04/27/24        Follow up: 3 months  [facility] Specialty Pharmacist

## 2024-05-10 NOTE — Progress Notes (Signed)
 Specialty Pharmacy Refill Coordination Note  Cassidy Norris is a 73 y.o. female contacted today regarding refills of specialty medication(s) Ribociclib  Succinate (KISQALI  400mg  Daily Dose)   Patient requested Delivery   Delivery date: 05/18/24   Verified address: [address] [place] [address]   Medication will be filled on 05/17/24.

## 2024-05-18 ENCOUNTER — Encounter: Payer: Self-pay | Admitting: Oncology

## 2024-05-25 ENCOUNTER — Inpatient Hospital Stay: Admitting: Pharmacist

## 2024-05-25 ENCOUNTER — Encounter: Payer: Self-pay | Admitting: Pharmacist

## 2024-05-25 ENCOUNTER — Inpatient Hospital Stay: Attending: Oncology

## 2024-05-25 ENCOUNTER — Other Ambulatory Visit: Payer: Self-pay

## 2024-05-25 ENCOUNTER — Inpatient Hospital Stay

## 2024-05-25 ENCOUNTER — Encounter: Payer: Self-pay | Admitting: Oncology

## 2024-05-25 VITALS — BP 103/55 | HR 82 | Temp 97.9°F | Resp 18 | Ht 65.5 in | Wt 203.0 lb

## 2024-05-25 DIAGNOSIS — Z170 Estrogen receptor positive status [ER+]: Secondary | ICD-10-CM | POA: Insufficient documentation

## 2024-05-25 DIAGNOSIS — Z1731 Human epidermal growth factor receptor 2 positive status: Secondary | ICD-10-CM | POA: Diagnosis not present

## 2024-05-25 DIAGNOSIS — Z1732 Human epidermal growth factor receptor 2 negative status: Secondary | ICD-10-CM | POA: Diagnosis not present

## 2024-05-25 DIAGNOSIS — C50911 Malignant neoplasm of unspecified site of right female breast: Secondary | ICD-10-CM | POA: Insufficient documentation

## 2024-05-25 DIAGNOSIS — Z5111 Encounter for antineoplastic chemotherapy: Secondary | ICD-10-CM | POA: Insufficient documentation

## 2024-05-25 DIAGNOSIS — C50919 Malignant neoplasm of unspecified site of unspecified female breast: Secondary | ICD-10-CM

## 2024-05-25 DIAGNOSIS — D649 Anemia, unspecified: Secondary | ICD-10-CM | POA: Diagnosis not present

## 2024-05-25 DIAGNOSIS — C50912 Malignant neoplasm of unspecified site of left female breast: Secondary | ICD-10-CM | POA: Diagnosis not present

## 2024-05-25 DIAGNOSIS — C778 Secondary and unspecified malignant neoplasm of lymph nodes of multiple regions: Secondary | ICD-10-CM | POA: Diagnosis not present

## 2024-05-25 LAB — CMP (CANCER CENTER ONLY)
ALT: 32 U/L (ref 0–44)
AST: 44 U/L — ABNORMAL HIGH (ref 15–41)
Albumin: 4 g/dL (ref 3.5–5.0)
Alkaline Phosphatase: 77 U/L (ref 38–126)
Anion gap: 9 (ref 5–15)
BUN: 27 mg/dL — ABNORMAL HIGH (ref 8–23)
CO2: 23 mmol/L (ref 22–32)
Calcium: 9.1 mg/dL (ref 8.9–10.3)
Chloride: 104 mmol/L (ref 98–111)
Creatinine: 1.74 mg/dL — ABNORMAL HIGH (ref 0.44–1.00)
GFR, Estimated: 31 mL/min — ABNORMAL LOW (ref >60)
Glucose, Bld: 129 mg/dL — ABNORMAL HIGH (ref 70–99)
Potassium: 4.8 mmol/L (ref 3.5–5.1)
Sodium: 136 mmol/L (ref 135–145)
Total Bilirubin: 0.5 mg/dL (ref 0.0–1.2)
Total Protein: 7.3 g/dL (ref 6.5–8.1)

## 2024-05-25 LAB — CBC WITH DIFFERENTIAL (CANCER CENTER ONLY)
Abs Immature Granulocytes: 0.02 10*3/uL (ref 0.00–0.07)
Basophils Absolute: 0.1 10*3/uL (ref 0.0–0.1)
Basophils Relative: 2 %
Eosinophils Absolute: 0.2 10*3/uL (ref 0.0–0.5)
Eosinophils Relative: 3 %
HCT: 30.6 % — ABNORMAL LOW (ref 36.0–46.0)
Hemoglobin: 10.1 g/dL — ABNORMAL LOW (ref 12.0–15.0)
Immature Granulocytes: 1 %
Lymphocytes Relative: 28 %
Lymphs Abs: 1.2 10*3/uL (ref 0.7–4.0)
MCH: 36.2 pg — ABNORMAL HIGH (ref 26.0–34.0)
MCHC: 33 g/dL (ref 30.0–36.0)
MCV: 109.7 fL — ABNORMAL HIGH (ref 80.0–100.0)
Monocytes Absolute: 0.7 10*3/uL (ref 0.1–1.0)
Monocytes Relative: 16 %
Neutro Abs: 2.2 10*3/uL (ref 1.7–7.7)
Neutrophils Relative %: 50 %
Platelet Count: 197 10*3/uL (ref 150–400)
RBC: 2.79 MIL/uL — ABNORMAL LOW (ref 3.87–5.11)
RDW: 14.6 % (ref 11.5–15.5)
WBC Count: 4.4 10*3/uL (ref 4.0–10.5)
nRBC: 0 % (ref 0.0–0.2)

## 2024-05-25 MED ORDER — FULVESTRANT 250 MG/5ML IM SOSY
500.0000 mg | PREFILLED_SYRINGE | Freq: Once | INTRAMUSCULAR | Status: AC
  Administered 2024-05-25: 500 mg via INTRAMUSCULAR
  Filled 2024-05-25: qty 10

## 2024-05-25 NOTE — Progress Notes (Signed)
 Oral Chemotherapy Clinic [facility]  [phone] [phone]  Patient Care Team: Edman Marsa PARAS, DO as PCP - General (Family Medicine) Darron Deatrice LABOR, MD as PCP - Cardiology (Cardiology) Skeet Juliene SAUNDERS, DO as Consulting Physician (Neurology) Jacobo Evalene PARAS, MD as Consulting Physician (Oncology) Maurie Rayfield BIRCH, RN as Oncology Nurse Navigator Mevelyn BIRCH [place], [place] (Optometry)   Name of the patient: Cassidy Norris  [id number]  12-Dec-1950   Date of visit: 05/25/24  HPI: Patient is a 73 y.o. female with recurrent breast cancer, ER/PR positive/HER2 negative. Currently treating with Kisqali  (ribociclib ) and fulvestrant . She started ribociclib  on 01/08/22. Her treatment was held for surgery which was completed on 08/04/23. Patient resumed her ribociclib  on 08/19/23. Patient was dosed reduced to 400mg  dosing on 04/27/24 due to fatigue.  Reason for Consult: Oral chemotherapy follow-up for ribociclib  therapy.   PAST MEDICAL HISTORY: Past Medical History:  Diagnosis Date   Actinic keratosis    Anemia    Arthritis not really diagnosis but have pain   B12 deficiency 07/23/2016   Will check levels to determine if injections are needed again. Await results. Recent CBC at Onc WNL   Blood transfusion without reported diagnosis 1983 had a miscarriage/dnc   Cancer (HCC) 12/30/2013   Multifocal disease: pT1c,m, N0(isolated tumor cells) Er/ PR positive, Her 2 negative.   Cancer (HCC) 01/09/2014   Invasive lobular carcinoma, 2.2 cm, T2,N1 ER/PR positive, HER-2/neu negative   Cataract 2014?   Centrilobular emphysema (HCC) 10/27/2021   CKD (chronic kidney disease), stage IV (HCC) 07/13/2023   Complication of anesthesia 03/06/2022   Prolonged metabolism of Rocuronium  during case.   Diabetes mellitus without complication (HCC)    Diffuse cystic mastopathy    Erosive esophagitis    Essential hypertension, benign 09/24/2017   Family history of  malignant neoplasm of gastrointestinal tract 2012   Fractured elbow 08/28/2014   Gastroesophageal reflux disease    Hx of metabolic acidosis with increased anion gap    Increased heart rate    Multiple sclerosis (HCC)    Neuromuscular disorder (HCC)    neuropathy in bil feet - left is worse.   Obesity, unspecified    Peripheral neuropathy    Personal history of tobacco use, presenting hazards to health    Restless leg syndrome    Sleep apnea    uses CPAP   Special screening for malignant neoplasms, colon    Squamous cell carcinoma of skin 08/24/2022   SCCIS - scalp, ED&C   Squamous cell carcinoma of skin 07/20/2023   SCC IS right of midline ant scalp post, tx with ED&C   Squamous cell carcinoma of skin 07/20/2023   SCC IS right of midline ant scalp ant, tx with [facility]    HEMATOLOGY/ONCOLOGY HISTORY:  Oncology History Overview Note  1. Bilateral carcinoma of breast, February, 2015. Right breast status post radical mastectomy. T1 C. N0M0 (isolated tumor cells in one lymph node) multifocal invasive cancer. Stage IC, Left breast, Status post radical mastectomy, T2, N1, M0 tumor. Stage II, RIght breast. Both tumors are estrogen receptor positive.  Progesterone receptor positive.  HER-2 receptor negative by FISH. Negative for BRCA mutation.  2. Started on Adriamycin and Cytoxan on March 01, 2014. Last cycle of Cytoxan and Adriamycin on May 01, 2014. Patient has finished last chemotherapy on September 9. (12 th dose was omitted because of neuropathy) 3.  Taking tamoxifen .  Patient had a poor tolerance to letrozole.(October, 2015) 4.  Patient is taking letrozole  since November of 2015 5.  May, 2016 Letrozole has been put on hold because of bony pains 6.  Started on Aromasin  from July of 2016    History of bilateral breast cancer (Resolved)  11/29/2013 Initial Diagnosis   Breast cancer bilateral, multifocal ER/PR pos, Her 2 neg,.     ALLERGIES:  has no known allergies.  MEDICATIONS:   Current Outpatient Medications  Medication Sig Dispense Refill   acetaminophen  (TYLENOL ) 500 MG tablet Take 1,000 mg by mouth 2 (two) times daily as needed for moderate pain or headache.     baclofen  (LIORESAL ) 10 MG tablet Take 1 tablet (10 mg total) by mouth 3 (three) times daily as needed for muscle spasms. 270 tablet 1   calcium  carbonate (OSCAL) 1500 (600 Ca) MG TABS tablet Take by mouth 2 (two) times daily with a meal.     dapagliflozin propanediol (FARXIGA) 10 MG TABS tablet Take 10 mg by mouth daily.     diclofenac  Sodium (VOLTAREN ) 1 % GEL Apply 2 g topically 3 (three) times daily as needed. 100 g 2   ezetimibe  (ZETIA ) 10 MG tablet Take 1 tablet (10 mg total) by mouth daily. 90 tablet 3   fluticasone  (FLONASE ) 50 MCG/ACT nasal spray PLACE 2 SPRAYS INTO BOTH NOSTRILS AT BEDTIME. 48 g 1   fulvestrant  (FASLODEX ) 250 MG/5ML injection Inject 500 mg into the muscle every 30 (thirty) days. One injection each buttock over 1-2 minutes. Warm prior to use.     Krill Oil 1000 MG CAPS Take 1,000 mg by mouth daily.     lisinopril  (ZESTRIL ) 5 MG tablet Take 1 tablet (5 mg total) by mouth daily. 90 tablet 3   metFORMIN  (GLUCOPHAGE ) 500 MG tablet Take 1 tablet (500 mg total) by mouth 2 (two) times daily with a meal. 180 tablet 3   metoprolol  tartrate (LOPRESSOR ) 25 MG tablet TAKE 1/2 TABLET TWICE A DAY BY MOUTH 90 tablet 3   omeprazole  (PRILOSEC) 40 MG capsule TAKE 1 CAPSULE BY MOUTH TWICE A DAY 180 capsule 1   ribociclib  succ (KISQALI , 400 MG DOSE,) 200 MG Therapy Pack Take 2 tablets (400 mg total) by mouth daily. Take for 21 days on, 7 days off, repeat every 28 days. 42 tablet 1   sodium chloride  (OCEAN) 0.65 % SOLN nasal spray Place 1 spray into both nostrils as needed for congestion.     traZODone  (DESYREL ) 50 MG tablet TAKE 1 TABLET (50 MG TOTAL) BY MOUTH AT BEDTIME AS NEEDED. FOR SLEEP 90 tablet 1   venlafaxine  XR (EFFEXOR -XR) 75 MG 24 hr capsule Take 1 capsule (75 mg total) by mouth daily with  breakfast. 90 capsule 3   vitamin B-12 (CYANOCOBALAMIN ) 1000 MCG tablet Take 2,500 mcg by mouth every Monday, Wednesday, and Friday.     Vitamin D , Cholecalciferol , 50 MCG (2000 UT) CAPS Take 2,000 Units by mouth every Monday, Wednesday, and Friday.     loperamide  (IMODIUM ) 2 MG capsule TAKE 2 TABLETS (4 MG TOTAL) BY MOUTH 4 (FOUR) TIMES DAILY AS NEEDED FOR DIARRHEA OR LOOSE STOOLS. (Patient not taking: Reported on 05/25/2024) 30 capsule 1   ondansetron  (ZOFRAN ) 8 MG tablet Take 1 tablet (8 mg total) by mouth every 8 (eight) hours as needed for nausea or vomiting. (Patient not taking: Reported on 05/25/2024) 20 tablet 0   prochlorperazine  (COMPAZINE ) 10 MG tablet Take 1 tablet (10 mg total) by mouth every 6 (six) hours as needed for nausea or vomiting. (Patient not taking: Reported on 05/25/2024) 20  tablet 1   No current facility-administered medications for this visit.    VITAL SIGNS: BP (!) 103/55   Pulse 82   Temp 97.9 F (36.6 C) (Tympanic)   Resp 18   Ht 5' 5.5 (1.664 m)   Wt 92.1 kg (203 lb)   LMP 12/22/1999 (Approximate)   BMI 33.27 kg/m  Filed Weights   05/25/24 1023  Weight: 92.1 kg (203 lb)       Estimated body mass index is 33.27 kg/m as calculated from the following:   Height as of this encounter: 5' 5.5 (1.664 m).   Weight as of this encounter: 92.1 kg (203 lb).  LABS: CBC:    Component Value Date/Time   WBC 4.4 05/25/2024 1010   WBC 4.0 03/30/2024 0950   HGB 10.1 (L) 05/25/2024 1010   HGB 15.5 07/13/2017 0937   HCT 30.6 (L) 05/25/2024 1010   HCT 46.7 (H) 07/13/2017 0937   PLT 197 05/25/2024 1010   PLT 211 07/13/2017 0937   MCV 109.7 (H) 05/25/2024 1010   MCV 94 07/13/2017 0937   MCV 93 03/11/2015 1356   NEUTROABS 2.2 05/25/2024 1010   NEUTROABS 5.4 07/13/2017 0937   NEUTROABS 5.6 03/11/2015 1356   LYMPHSABS 1.2 05/25/2024 1010   LYMPHSABS 2.1 07/13/2017 0937   LYMPHSABS 2.3 03/11/2015 1356   MONOABS 0.7 05/25/2024 1010   MONOABS 1.0 (H) 03/11/2015 1356    EOSABS 0.2 05/25/2024 1010   EOSABS 0.2 07/13/2017 0937   EOSABS 0.3 03/11/2015 1356   BASOSABS 0.1 05/25/2024 1010   BASOSABS 0.0 07/13/2017 0937   BASOSABS 0.1 03/11/2015 1356   Comprehensive Metabolic Panel:    Component Value Date/Time   NA 136 05/25/2024 1010   NA 142 07/14/2019 1516   NA 139 03/11/2015 1356   K 4.8 05/25/2024 1010   K 3.4 (L) 03/11/2015 1356   CL 104 05/25/2024 1010   CL 103 03/11/2015 1356   CO2 23 05/25/2024 1010   CO2 28 03/11/2015 1356   BUN 27 (H) 05/25/2024 1010   BUN 12 07/14/2019 1516   BUN 13 03/11/2015 1356   CREATININE 1.74 (H) 05/25/2024 1010   CREATININE 0.85 10/20/2021 0838   GLUCOSE 129 (H) 05/25/2024 1010   GLUCOSE 118 (H) 03/11/2015 1356   CALCIUM  9.1 05/25/2024 1010   CALCIUM  9.2 03/11/2015 1356   AST 44 (H) 05/25/2024 1010   ALT 32 05/25/2024 1010   ALT 32 03/11/2015 1356   ALKPHOS 77 05/25/2024 1010   ALKPHOS 68 03/11/2015 1356   BILITOT 0.5 05/25/2024 1010   PROT 7.3 05/25/2024 1010   PROT 6.9 07/14/2019 1516   PROT 6.9 03/11/2015 1356   ALBUMIN 4.0 05/25/2024 1010   ALBUMIN 4.5 07/14/2019 1516   ALBUMIN 4.2 03/11/2015 1356     Present during today's visit: patient only   Assessment and Plan: CBC/CMP reviewed, labs unchanged (note: ca27.29 is still pending)  Continue ribociclib  400mg  21on/7off  Patient to receive fulvestrant  today    Oral Chemotherapy Side Effect/Intolerance:  Nausea: improved since dose decreasing to 400mg   Fatigue: improved since dose decreasing to 400mg    Oral Chemotherapy Adherence: No missed doses reported in the last cycle There are no patient barriers to medication adherence identified.   New medications: None reported  Medication Access Issues: No issues, receives Health and safety inspector and fill at [employer] (Specialty).   Patient expressed understanding and was in agreement with this plan. She also understands that She can call clinic at any time with  any questions, concerns, or  complaints.   Follow-up plan: RTC in 4 weeks.   Thank you for allowing me to participate in the care of this very pleasant patient.   Time Total: 15 minutes  Visit consisted of counseling and education on dealing with issues of symptom management in the setting of serious and potentially life-threatening illness.Greater than 50%  of this time was spent counseling and coordinating care related to the above assessment and plan.  Signed by: Alfhild Partch N. Benna Arno, PharmD, NEILA, CPP Hematology/Oncology Clinical Pharmacist Practitioner [place] [phone]  05/25/2024 1:52 PM

## 2024-05-26 LAB — CANCER ANTIGEN 27.29: CA 27.29: 72 U/mL — ABNORMAL HIGH (ref 0.0–38.6)

## 2024-05-31 ENCOUNTER — Other Ambulatory Visit: Payer: Self-pay | Admitting: Family Medicine

## 2024-05-31 DIAGNOSIS — G4700 Insomnia, unspecified: Secondary | ICD-10-CM

## 2024-06-01 NOTE — Telephone Encounter (Signed)
 Requested Prescriptions  Pending Prescriptions Disp Refills   traZODone  (DESYREL ) 50 MG tablet [Pharmacy Med Name: TRAZODONE  50 MG TABLET] 90 tablet 1    Sig: TAKE 1 TABLET (50 MG TOTAL) BY MOUTH AT BEDTIME AS NEEDED. FOR SLEEP     Psychiatry: Antidepressants - Serotonin Modulator Passed - 06/01/2024  5:01 PM      Passed - Valid encounter within last 6 months    Recent Outpatient Visits           1 month ago Type 2 diabetes mellitus with other specified complication, without long-term current use of insulin  [facility])   [place] [facility] [place], Marsa PARAS, DO   5 months ago Centrilobular emphysema [facility])   [place] [facility] [place] Marsa PARAS, DO       Future Appointments             In 3 months Hester Alm BROCKS, MD [facility]

## 2024-06-06 ENCOUNTER — Encounter: Payer: Self-pay | Admitting: Oncology

## 2024-06-07 ENCOUNTER — Other Ambulatory Visit: Payer: Self-pay

## 2024-06-07 ENCOUNTER — Other Ambulatory Visit: Payer: Self-pay | Admitting: Pharmacy Technician

## 2024-06-07 NOTE — Progress Notes (Signed)
 Specialty Pharmacy Refill Coordination Note  Cassidy Norris is a 73 y.o. female contacted today regarding refills of specialty medication(s) Ribociclib  Succinate (KISQALI  400mg  Daily Dose)   Patient requested Delivery   Delivery date: 06/13/24   Verified address: [address]   [place] [phone]   Medication will be filled on 06/12/24.

## 2024-06-08 ENCOUNTER — Other Ambulatory Visit: Payer: Self-pay | Admitting: Family Medicine

## 2024-06-08 DIAGNOSIS — E1169 Type 2 diabetes mellitus with other specified complication: Secondary | ICD-10-CM

## 2024-06-09 NOTE — Telephone Encounter (Signed)
 Requested Prescriptions  Pending Prescriptions Disp Refills   metFORMIN  (GLUCOPHAGE ) 500 MG tablet [Pharmacy Med Name: METFORMIN  HCL 500 MG TABLET] 180 tablet 1    Sig: TAKE 1 TABLET BY MOUTH 2 TIMES DAILY WITH A MEAL.     Endocrinology:  Diabetes - Biguanides Failed - 06/09/2024 11:30 AM      Failed - Cr in normal range and within 360 days    Creatinine  Date Value Ref Range Status  05/25/2024 1.74 (H) 0.44 - 1.00 mg/dL Final   Creat  Date Value Ref Range Status  10/20/2021 0.85 0.50 - 1.05 mg/dL Final   Creatinine, Urine  Date Value Ref Range Status  06/21/2023 74  Final         Failed - eGFR in normal range and within 360 days    GFR, Est African American  Date Value Ref Range Status  10/15/2020 72 > OR = 60 mL/min/1.20m2 Final   GFR, Est Non African American  Date Value Ref Range Status  10/15/2020 62 > OR = 60 mL/min/1.23m2 Final   GFR, Estimated  Date Value Ref Range Status  05/25/2024 31 (L) >60 mL/min Final    Comment:    (NOTE) Calculated using the CKD-EPI Creatinine Equation (2021)    GFR  Date Value Ref Range Status  12/28/2019 62.40 >60.00 mL/min Final   eGFR  Date Value Ref Range Status  10/20/2021 74 > OR = 60 mL/min/1.59m2 Final    Comment:    The eGFR is based on the CKD-EPI 2021 equation. To calculate  the new eGFR from a previous Creatinine or Cystatin C result, go to https://www.kidney.org/professionals/ kdoqi/gfr%5Fcalculator          Failed - B12 Level in normal range and within 720 days    Vitamin B-12  Date Value Ref Range Status  10/20/2021 >2,000 (H) 200 - 1,100 pg/mL Final         Passed - HBA1C is between 0 and 7.9 and within 180 days    Hemoglobin A1C  Date Value Ref Range Status  05/02/2024 6.0 (A) 4.0 - 5.6 % Final   HB A1C (BAYER DCA - WAIVED)  Date Value Ref Range Status  07/13/2017 6.5 <7.0 % Final    Comment:                                          Diabetic Adult            <7.0                                        Healthy Adult        4.3 - 5.7                                                           (DCCT/NGSP) American Diabetes Association's Summary of Glycemic Recommendations for Adults with Diabetes: Hemoglobin A1c <7.0%. More stringent glycemic goals (A1c <6.0%) may further reduce complications at the cost of increased risk of hypoglycemia.    Hgb A1c MFr Bld  Date Value Ref Range Status  10/25/2023 6.0 (H) <  5.7 % of total Hgb Final    Comment:    For someone without known diabetes, a hemoglobin  A1c value between 5.7% and 6.4% is consistent with prediabetes and should be confirmed with a  follow-up test. . For someone with known diabetes, a value <7% indicates that their diabetes is well controlled. A1c targets should be individualized based on duration of diabetes, age, comorbid conditions, and other considerations. . This assay result is consistent with an increased risk of diabetes. . Currently, no consensus exists regarding use of hemoglobin A1c for diagnosis of diabetes for children. [name] Amy - Valid encounter within last 6 months    Recent Outpatient Visits           1 month ago Type 2 diabetes mellitus with other specified complication, without long-term current use of insulin  [facility])   [place] [facility] [place], Marsa PARAS, DO   5 months ago Centrilobular emphysema [facility])   [place] [place] Marsa PARAS, DO       Future Appointments             In 2 weeks Loistine Sober, NP [facility] [place]   In 2 months Hester Alm BROCKS, MD [place] [facility]            Passed - CBC within normal limits and completed in the last 12 months    WBC  Date Value Ref Range Status  03/30/2024 4.0 4.0 - 10.5 K/uL Final   WBC Count  Date Value Ref Range Status  05/25/2024 4.4 4.0 - 10.5 K/uL Final   RBC  Date Value Ref Range Status  05/25/2024 2.79 (L) 3.87 - 5.11  MIL/uL Final   Hemoglobin  Date Value Ref Range Status  05/25/2024 10.1 (L) 12.0 - 15.0 g/dL Final  91/78/7981 84.4 11.1 - 15.9 g/dL Final   HCT  Date Value Ref Range Status  05/25/2024 30.6 (L) 36.0 - 46.0 % Final   Hematocrit  Date Value Ref Range Status  07/13/2017 46.7 (H) 34.0 - 46.6 % Final   MCHC  Date Value Ref Range Status  05/25/2024 33.0 30.0 - 36.0 g/dL Final   [facility]  Date Value Ref Range Status  05/25/2024 36.2 (H) 26.0 - 34.0 pg Final   MCV  Date Value Ref Range Status  05/25/2024 109.7 (H) 80.0 - 100.0 fL Final  07/13/2017 94 79 - 97 fL Final  03/11/2015 93 80 - 100 fL Final   No results found for: PLTCOUNTKUC, LABPLAT, POCPLA RDW  Date Value Ref Range Status  05/25/2024 14.6 11.5 - 15.5 % Final  07/13/2017 13.1 12.3 - 15.4 % Final  03/11/2015 13.8 11.5 - 14.5 % Final

## 2024-06-12 ENCOUNTER — Other Ambulatory Visit: Payer: Self-pay

## 2024-06-13 ENCOUNTER — Other Ambulatory Visit: Payer: Self-pay | Admitting: Neurology

## 2024-06-15 ENCOUNTER — Encounter: Payer: Self-pay | Admitting: Oncology

## 2024-06-21 DIAGNOSIS — N1832 Chronic kidney disease, stage 3b: Secondary | ICD-10-CM | POA: Diagnosis not present

## 2024-06-21 DIAGNOSIS — I129 Hypertensive chronic kidney disease with stage 1 through stage 4 chronic kidney disease, or unspecified chronic kidney disease: Secondary | ICD-10-CM | POA: Diagnosis not present

## 2024-06-21 DIAGNOSIS — E1122 Type 2 diabetes mellitus with diabetic chronic kidney disease: Secondary | ICD-10-CM | POA: Diagnosis not present

## 2024-06-21 DIAGNOSIS — N189 Chronic kidney disease, unspecified: Secondary | ICD-10-CM | POA: Diagnosis not present

## 2024-06-22 ENCOUNTER — Ambulatory Visit

## 2024-06-22 ENCOUNTER — Ambulatory Visit: Admitting: Pharmacist

## 2024-06-22 ENCOUNTER — Encounter: Payer: Self-pay | Admitting: Oncology

## 2024-06-22 ENCOUNTER — Inpatient Hospital Stay (HOSPITAL_BASED_OUTPATIENT_CLINIC_OR_DEPARTMENT_OTHER): Admitting: Oncology

## 2024-06-22 ENCOUNTER — Other Ambulatory Visit: Payer: Self-pay | Admitting: *Deleted

## 2024-06-22 ENCOUNTER — Ambulatory Visit: Admitting: Oncology

## 2024-06-22 ENCOUNTER — Inpatient Hospital Stay

## 2024-06-22 ENCOUNTER — Other Ambulatory Visit

## 2024-06-22 VITALS — BP 114/60 | HR 84 | Temp 98.9°F | Resp 19 | Wt 203.9 lb

## 2024-06-22 DIAGNOSIS — Z5111 Encounter for antineoplastic chemotherapy: Secondary | ICD-10-CM | POA: Diagnosis not present

## 2024-06-22 DIAGNOSIS — Z1731 Human epidermal growth factor receptor 2 positive status: Secondary | ICD-10-CM | POA: Diagnosis not present

## 2024-06-22 DIAGNOSIS — C50919 Malignant neoplasm of unspecified site of unspecified female breast: Secondary | ICD-10-CM

## 2024-06-22 DIAGNOSIS — C778 Secondary and unspecified malignant neoplasm of lymph nodes of multiple regions: Secondary | ICD-10-CM | POA: Diagnosis not present

## 2024-06-22 DIAGNOSIS — Z170 Estrogen receptor positive status [ER+]: Secondary | ICD-10-CM | POA: Diagnosis not present

## 2024-06-22 DIAGNOSIS — C50912 Malignant neoplasm of unspecified site of left female breast: Secondary | ICD-10-CM | POA: Diagnosis not present

## 2024-06-22 DIAGNOSIS — C50911 Malignant neoplasm of unspecified site of right female breast: Secondary | ICD-10-CM | POA: Diagnosis not present

## 2024-06-22 LAB — CMP (CANCER CENTER ONLY)
ALT: 33 U/L (ref 0–44)
AST: 36 U/L (ref 15–41)
Albumin: 3.4 g/dL — ABNORMAL LOW (ref 3.5–5.0)
Alkaline Phosphatase: 84 U/L (ref 38–126)
Anion gap: 9 (ref 5–15)
BUN: 32 mg/dL — ABNORMAL HIGH (ref 8–23)
CO2: 25 mmol/L (ref 22–32)
Calcium: 9.2 mg/dL (ref 8.9–10.3)
Chloride: 107 mmol/L (ref 98–111)
Creatinine: 1.88 mg/dL — ABNORMAL HIGH (ref 0.44–1.00)
GFR, Estimated: 28 mL/min — ABNORMAL LOW (ref >60)
Glucose, Bld: 155 mg/dL — ABNORMAL HIGH (ref 70–99)
Potassium: 4.9 mmol/L (ref 3.5–5.1)
Sodium: 141 mmol/L (ref 135–145)
Total Bilirubin: 0.4 mg/dL (ref 0.0–1.2)
Total Protein: 7.2 g/dL (ref 6.5–8.1)

## 2024-06-22 LAB — CBC WITH DIFFERENTIAL (CANCER CENTER ONLY)
Abs Immature Granulocytes: 0.01 K/uL (ref 0.00–0.07)
Basophils Absolute: 0.1 K/uL (ref 0.0–0.1)
Basophils Relative: 2 %
Eosinophils Absolute: 0.1 K/uL (ref 0.0–0.5)
Eosinophils Relative: 3 %
HCT: 29.1 % — ABNORMAL LOW (ref 36.0–46.0)
Hemoglobin: 9.5 g/dL — ABNORMAL LOW (ref 12.0–15.0)
Immature Granulocytes: 0 %
Lymphocytes Relative: 32 %
Lymphs Abs: 1.3 K/uL (ref 0.7–4.0)
MCH: 35.4 pg — ABNORMAL HIGH (ref 26.0–34.0)
MCHC: 32.6 g/dL (ref 30.0–36.0)
MCV: 108.6 fL — ABNORMAL HIGH (ref 80.0–100.0)
Monocytes Absolute: 0.5 K/uL (ref 0.1–1.0)
Monocytes Relative: 14 %
Neutro Abs: 1.9 K/uL (ref 1.7–7.7)
Neutrophils Relative %: 49 %
Platelet Count: 160 K/uL (ref 150–400)
RBC: 2.68 MIL/uL — ABNORMAL LOW (ref 3.87–5.11)
RDW: 14.1 % (ref 11.5–15.5)
WBC Count: 3.9 K/uL — ABNORMAL LOW (ref 4.0–10.5)
nRBC: 0 % (ref 0.0–0.2)

## 2024-06-22 MED ORDER — FULVESTRANT 250 MG/5ML IM SOSY
500.0000 mg | PREFILLED_SYRINGE | Freq: Once | INTRAMUSCULAR | Status: AC
Start: 2024-06-22 — End: 2024-06-22
  Administered 2024-06-22: 500 mg via INTRAMUSCULAR
  Filled 2024-06-22: qty 10

## 2024-06-22 NOTE — Progress Notes (Signed)
 Patient has no concerns

## 2024-06-22 NOTE — Progress Notes (Signed)
 [facility]  [phone]  [phone]     Cassidy Norris DOB: 1951/10/01  MR#: [id number]  RDW#:[id number]  Patient Care Team: Edman Marsa PARAS, DO as PCP - General (Family Medicine) Darron Deatrice LABOR, MD as PCP - Cardiology (Cardiology) Skeet Juliene SAUNDERS, DO as Consulting Physician (Neurology) Jacobo Evalene PARAS, MD as Consulting Physician (Oncology) Maurie Rayfield BIRCH, RN as Oncology Nurse Navigator Mevelyn BIRCH Bathe, OD (Optometry)  CHIEF COMPLAINT: Recurrent, metastatic ER/PR, HER2 negative invasive carcinoma of the breast.    INTERVAL HISTORY: Patient returns to clinic today for further evaluation and continuation of dose reduced Kisqali  and fulvestrant .  She feels significantly improved with the dose reduction of treatment and no longer reports any nausea or fatigue.  She currently feels well and is asymptomatic.  She does not complain of any pain today.  She has no new neurologic complaints.  She denies any recent fevers or illnesses.  She has a fair appetite and denies weight loss.  She has no chest pain, shortness of breath, cough, or hemoptysis.  She denies any nausea, vomiting, constipation, or diarrhea.  She has no urinary complaints.  Patient offers no further specific complaints today.  REVIEW OF SYSTEMS:   Review of Systems  Constitutional: Negative.  Negative for fever, malaise/fatigue and weight loss.  Respiratory: Negative.  Negative for cough, hemoptysis and shortness of breath.   Cardiovascular: Negative.  Negative for chest pain and leg swelling.  Gastrointestinal: Negative.  Negative for abdominal pain, nausea and vomiting.  Genitourinary: Negative.  Negative for dysuria and flank pain.  Musculoskeletal:  Negative for joint pain and myalgias.  Skin: Negative.  Negative for rash.  Neurological: Negative.  Negative for dizziness, sensory change, focal weakness, weakness and headaches.  Psychiatric/Behavioral: Negative.  The  patient is not nervous/anxious.     As per HPI. Otherwise, a complete review of systems is negative.  ONCOLOGY HISTORY: Oncology History Overview Note  1. Bilateral carcinoma of breast, February, 2015. Right breast status post radical mastectomy. T1 C. N0M0 (isolated tumor cells in one lymph node) multifocal invasive cancer. Stage IC, Left breast, Status post radical mastectomy, T2, N1, M0 tumor. Stage II, RIght breast. Both tumors are estrogen receptor positive.  Progesterone receptor positive.  HER-2 receptor negative by FISH. Negative for BRCA mutation.  2. Started on Adriamycin and Cytoxan on March 01, 2014. Last cycle of Cytoxan and Adriamycin on May 01, 2014. Patient has finished last chemotherapy on September 9. (12 th dose was omitted because of neuropathy) 3.  Taking tamoxifen .  Patient had a poor tolerance to letrozole.(October, 2015) 4.  Patient is taking letrozole since November of 2015 5.  May, 2016 Letrozole has been put on hold because of bony pains 6.  Started on Aromasin  from July of 2016    History of bilateral breast cancer (Resolved)  11/29/2013 Initial Diagnosis   Breast cancer bilateral, multifocal ER/PR pos, Her 2 neg,.     PAST MEDICAL HISTORY: Past Medical History:  Diagnosis Date   Actinic keratosis    Anemia    Arthritis not really diagnosis but have pain   B12 deficiency 07/23/2016   Will check levels to determine if injections are needed again. Await results. Recent CBC at Onc WNL   Blood transfusion without reported diagnosis 1983 had a miscarriage/dnc   Cancer (HCC) 12/30/2013   Multifocal disease: pT1c,m, N0(isolated tumor cells) Er/ PR positive, Her 2 negative.   Cancer (HCC) 01/09/2014   Invasive lobular carcinoma, 2.2 cm,  T2,N1 ER/PR positive, HER-2/neu negative   Cataract 2014?   Centrilobular emphysema (HCC) 10/27/2021   CKD (chronic kidney disease), stage IV (HCC) 07/13/2023   Complication of anesthesia 03/06/2022   Prolonged metabolism  of Rocuronium  during case.   Diabetes mellitus without complication (HCC)    Diffuse cystic mastopathy    Erosive esophagitis    Essential hypertension, benign 09/24/2017   Family history of malignant neoplasm of gastrointestinal tract 2012   Fractured elbow 08/28/2014   Gastroesophageal reflux disease    Hx of metabolic acidosis with increased anion gap    Increased heart rate    Multiple sclerosis (HCC)    Neuromuscular disorder (HCC)    neuropathy in bil feet - left is worse.   Obesity, unspecified    Peripheral neuropathy    Personal history of tobacco use, presenting hazards to health    Restless leg syndrome    Sleep apnea    uses CPAP   Special screening for malignant neoplasms, colon    Squamous cell carcinoma of skin 08/24/2022   SCCIS - scalp, ED&C   Squamous cell carcinoma of skin 07/20/2023   SCC IS right of midline ant scalp post, tx with ED&C   Squamous cell carcinoma of skin 07/20/2023   SCC IS right of midline ant scalp ant, tx with ED&C    PAST SURGICAL HISTORY: Past Surgical History:  Procedure Laterality Date   BREAST BIOPSY Right 1973,2001   BREAST BIOPSY Right 11/07/2013   INVASIVE MAMMARY CARCINOMA , ER/PR positive Her2 negative   BREAST BIOPSY Left 11/27/2013   invasive lobular and DCIS   BREAST SURGERY Bilateral 01/09/2014   mastectomy   cataract surgery Left 08/13/2022   CHOLECYSTECTOMY     COLONOSCOPY  2010   Dr. Dellie   COLONOSCOPY WITH PROPOFOL  N/A 06/11/2015   Procedure: COLONOSCOPY WITH PROPOFOL ;  Surgeon: Louanne KANDICE Dellie, MD;  Location: [facility] ENDOSCOPY;  Service: Endoscopy;  Laterality: N/A;   COLONOSCOPY WITH PROPOFOL  N/A 12/23/2021   Procedure: COLONOSCOPY WITH PROPOFOL ;  Surgeon: Unk Corinn Skiff, MD;  Location: [facility] ENDOSCOPY;  Service: Gastroenterology;  Laterality: N/A;   DILATATION & CURETTAGE/HYSTEROSCOPY WITH MYOSURE N/A 07/12/2018   Procedure: DILATATION & CURETTAGE/HYSTEROSCOPY WITH MYOSURE WITH ENDOMETRIAL POLYPECTOMY;   Surgeon: Leonce Garnette BIRCH, MD;  Location: [facility] ORS;  Service: Gynecology;  Laterality: N/A;   DILATION AND CURETTAGE OF UTERUS  1983   ESOPHAGOGASTRODUODENOSCOPY N/A 12/23/2021   Procedure: ESOPHAGOGASTRODUODENOSCOPY (EGD);  Surgeon: Unk Corinn Skiff, MD;  Location: [facility] ENDOSCOPY;  Service: Gastroenterology;  Laterality: N/A;   JOINT REPLACEMENT Left 11/23/2015   right  2023   KNEE ARTHROPLASTY Right 03/06/2022   Procedure: COMPUTER ASSISTED TOTAL KNEE ARTHROPLASTY;  Surgeon: Mardee Lynwood SQUIBB, MD;  Location: [facility] ORS;  Service: Orthopedics;  Laterality: Right;   LAPAROSCOPIC BILATERAL SALPINGO OOPHERECTOMY N/A 08/04/2023   Procedure: LAPAROSCOPIC BILATERAL SALPINGO OOPHORECTOMY;  Surgeon: Mancil Barter, MD;  Location: [facility] ORS;  Service: Gynecology;  Laterality: N/A;   POLYPECTOMY  1989   PORTA CATH REMOVAL     PORTACATH PLACEMENT  2015   SKIN BIOPSY     on scalp   SPINE SURGERY  09/14/2014   Ruptured disk L4- L5   TOTAL KNEE ARTHROPLASTY Left 11/22/2015   Procedure: TOTAL KNEE ARTHROPLASTY;  Surgeon: Toribio Silos, MD;  Location: [facility] OR;  Service: Orthopedics;  Laterality: Left;   TUBAL LIGATION     WISDOM TOOTH EXTRACTION  2006    FAMILY HISTORY Family History  Problem Relation Age of Onset  Lung cancer Mother 74   COPD Mother    Colon cancer Father 63   Stomach cancer Father 35   Hypertension Sister    Breast cancer Maternal Aunt        dx over 32   Breast cancer Paternal Aunt        dx 34s   Breast cancer Paternal Aunt        dx 13s   Rectal cancer Paternal Uncle        dx 37s   Rectal cancer Paternal Uncle        dx 40s   Diabetes Maternal Grandmother    Pancreatic cancer Paternal Grandfather    Rectal cancer Cousin        dx 65s-60s   Rectal cancer Cousin        dx >50    GYNECOLOGIC HISTORY:  Patient's last menstrual period was 12/22/1999 (approximate).     ADVANCED DIRECTIVES:    HEALTH MAINTENANCE: Social History   Tobacco Use   Smoking  status: Former    Current packs/day: 0.00    Average packs/day: 1 pack/day for 30.0 years (30.0 ttl pk-yrs)    Types: Cigarettes    Start date: 12/12/1983    Quit date: 12/11/2013    Years since quitting: 10.5   Smokeless tobacco: Former  Building services engineer status: Never Used  Substance Use Topics   Alcohol use: Not Currently   Drug use: No     Colonoscopy:  PAP:  Bone density:  Mammogram:  No Known Allergies  Current Outpatient Medications  Medication Sig Dispense Refill   acetaminophen  (TYLENOL ) 500 MG tablet Take 1,000 mg by mouth 2 (two) times daily as needed for moderate pain or headache.     baclofen  (LIORESAL ) 10 MG tablet TAKE 1 TABLET BY MOUTH THREE TIMES A DAY AS NEEDED FOR MUSCLE SPASMS 270 tablet 1   calcium  carbonate (OSCAL) 1500 (600 Ca) MG TABS tablet Take by mouth 2 (two) times daily with a meal.     dapagliflozin propanediol (FARXIGA) 10 MG TABS tablet Take 10 mg by mouth daily.     diclofenac  Sodium (VOLTAREN ) 1 % GEL Apply 2 g topically 3 (three) times daily as needed. 100 g 2   ezetimibe  (ZETIA ) 10 MG tablet Take 1 tablet (10 mg total) by mouth daily. 90 tablet 3   fluticasone  (FLONASE ) 50 MCG/ACT nasal spray PLACE 2 SPRAYS INTO BOTH NOSTRILS AT BEDTIME. 48 g 1   fulvestrant  (FASLODEX ) 250 MG/5ML injection Inject 500 mg into the muscle every 30 (thirty) days. One injection each buttock over 1-2 minutes. Warm prior to use.     Krill Oil 1000 MG CAPS Take 1,000 mg by mouth daily.     lisinopril  (ZESTRIL ) 5 MG tablet Take 1 tablet (5 mg total) by mouth daily. 90 tablet 3   metFORMIN  (GLUCOPHAGE ) 500 MG tablet TAKE 1 TABLET BY MOUTH 2 TIMES DAILY WITH A MEAL. 180 tablet 1   metoprolol  tartrate (LOPRESSOR ) 25 MG tablet TAKE 1/2 TABLET TWICE A DAY BY MOUTH 90 tablet 3   omeprazole  (PRILOSEC) 40 MG capsule TAKE 1 CAPSULE BY MOUTH TWICE A DAY 180 capsule 1   ribociclib  succ (KISQALI , 400 MG DOSE,) 200 MG Therapy Pack Take 2 tablets (400 mg total) by mouth daily. Take  for 21 days on, 7 days off, repeat every 28 days. 42 tablet 1   sodium chloride  (OCEAN) 0.65 % SOLN nasal spray Place 1 spray into both nostrils as  needed for congestion.     traZODone  (DESYREL ) 50 MG tablet TAKE 1 TABLET (50 MG TOTAL) BY MOUTH AT BEDTIME AS NEEDED. FOR SLEEP 90 tablet 1   venlafaxine  XR (EFFEXOR -XR) 75 MG 24 hr capsule Take 1 capsule (75 mg total) by mouth daily with breakfast. 90 capsule 3   vitamin B-12 (CYANOCOBALAMIN ) 1000 MCG tablet Take 2,500 mcg by mouth every Monday, Wednesday, and Friday.     Vitamin D , Cholecalciferol , 50 MCG (2000 UT) CAPS Take 2,000 Units by mouth every Monday, Wednesday, and Friday.     loperamide  (IMODIUM ) 2 MG capsule TAKE 2 TABLETS (4 MG TOTAL) BY MOUTH 4 (FOUR) TIMES DAILY AS NEEDED FOR DIARRHEA OR LOOSE STOOLS. (Patient not taking: Reported on 06/22/2024) 30 capsule 1   ondansetron  (ZOFRAN ) 8 MG tablet Take 1 tablet (8 mg total) by mouth every 8 (eight) hours as needed for nausea or vomiting. (Patient not taking: Reported on 06/22/2024) 20 tablet 0   prochlorperazine  (COMPAZINE ) 10 MG tablet Take 1 tablet (10 mg total) by mouth every 6 (six) hours as needed for nausea or vomiting. (Patient not taking: Reported on 06/22/2024) 20 tablet 1   No current facility-administered medications for this visit.    OBJECTIVE: BP 114/60   Pulse 84   Temp 98.9 F (37.2 C)   Resp 19   Wt 203 lb 14.4 oz (92.5 kg)   LMP 12/22/1999 (Approximate)   SpO2 98%   BMI 33.41 kg/m    Body mass index is 33.41 kg/m.    ECOG FS:0 - Asymptomatic  General: Well-developed, well-nourished, no acute distress. Eyes: Pink conjunctiva, anicteric sclera. HEENT: Normocephalic, moist mucous membranes. Lungs: No audible wheezing or coughing. Heart: Regular rate and rhythm. Abdomen: Soft, nontender, no obvious distention. Musculoskeletal: No edema, cyanosis, or clubbing. Neuro: Alert, answering all questions appropriately. Cranial nerves grossly intact. Skin: No rashes or  petechiae noted. Psych: Normal affect.  LAB RESULTS:  Appointment on 06/22/2024  Component Date Value Ref Range Status   WBC Count 06/22/2024 3.9 (L)  4.0 - 10.5 K/uL Final   RBC 06/22/2024 2.68 (L)  3.87 - 5.11 MIL/uL Final   Hemoglobin 06/22/2024 9.5 (L)  12.0 - 15.0 g/dL Final   HCT 92/68/7974 29.1 (L)  36.0 - 46.0 % Final   MCV 06/22/2024 108.6 (H)  80.0 - 100.0 fL Final   MCH 06/22/2024 35.4 (H)  26.0 - 34.0 pg Final   MCHC 06/22/2024 32.6  30.0 - 36.0 g/dL Final   RDW 92/68/7974 14.1  11.5 - 15.5 % Final   Platelet Count 06/22/2024 160  150 - 400 K/uL Final   nRBC 06/22/2024 0.0  0.0 - 0.2 % Final   Neutrophils Relative % 06/22/2024 49  % Final   Neutro Abs 06/22/2024 1.9  1.7 - 7.7 K/uL Final   Lymphocytes Relative 06/22/2024 32  % Final   Lymphs Abs 06/22/2024 1.3  0.7 - 4.0 K/uL Final   Monocytes Relative 06/22/2024 14  % Final   Monocytes Absolute 06/22/2024 0.5  0.1 - 1.0 K/uL Final   Eosinophils Relative 06/22/2024 3  % Final   Eosinophils Absolute 06/22/2024 0.1  0.0 - 0.5 K/uL Final   Basophils Relative 06/22/2024 2  % Final   Basophils Absolute 06/22/2024 0.1  0.0 - 0.1 K/uL Final   Immature Granulocytes 06/22/2024 0  % Final   Abs Immature Granulocytes 06/22/2024 0.01  0.00 - 0.07 K/uL Final   Performed at [facility], [address] [place], [place]  [address]   Sodium 06/22/2024 141  135 - 145 mmol/L Final   Potassium 06/22/2024 4.9  3.5 - 5.1 mmol/L Final   Chloride 06/22/2024 107  98 - 111 mmol/L Final   CO2 06/22/2024 25  22 - 32 mmol/L Final   Glucose, Bld 06/22/2024 155 (H)  70 - 99 mg/dL Final   Glucose reference range applies only to samples taken after fasting for at least 8 hours.   BUN 06/22/2024 32 (H)  8 - 23 mg/dL Final   Creatinine 92/68/7974 1.88 (H)  0.44 - 1.00 mg/dL Final   Calcium  06/22/2024 9.2  8.9 - 10.3 mg/dL Final   Total Protein 92/68/7974 7.2  6.5 - 8.1 g/dL Final   Albumin 92/68/7974 3.4 (L)  3.5 - 5.0 g/dL Final   AST  92/68/7974 36  15 - 41 U/L Final   ALT 06/22/2024 33  0 - 44 U/L Final   Alkaline Phosphatase 06/22/2024 84  38 - 126 U/L Final   Total Bilirubin 06/22/2024 0.4  0.0 - 1.2 mg/dL Final   GFR, Estimated 06/22/2024 28 (L)  >60 mL/min Final   Comment: (NOTE) Calculated using the CKD-EPI Creatinine Equation (2021)    Anion gap 06/22/2024 9  5 - 15 Final   Performed at [facility], [address] [place], [place] [address]    STUDIES: No results found.  ONCOLOGY HISTORY:  Bilateral adenocarcinoma of the breast. Patient is status post bilateral mastectomy in February 2015. She also received adjuvant chemotherapy with Adriamycin, Cytoxan, and Taxol completing treatment in approximately October 2015. She then initiated letrozole, but could not tolerate secondary to leg pain and was switched to Aromasin .  Patient then switched to tamoxifen  in October 2019 secondary to cost.  Patient subsequently discontinued tamoxifen  and did not complete the recommended 5 years of treatment.   ASSESSMENT: Recurrent, metastatic ER/PR, HER2 negative invasive carcinoma of the breast.   PLAN:    Recurrent, metastatic ER/PR, HER2 negative invasive carcinoma of the breast: See oncology history as above.  Biopsy confirmed recurrent disease.  PET scan results from December 26, 2021 reviewed independently with hypermetabolic left supraclavicular, subpectoral, and axillary lymphadenopathy consistent with nodal recurrence of patient's history of breast cancer.  No distant metastases were identified.  Her most recent PET scan on December 01, 2023 reviewed independently without evidence of recurrent or metastatic disease.  Her initial CA 27-29 initially was 260.1.  Now seems to have plateaued between 72.0 and 98.5.  Her most recent result is 72.0.  Today's result is pending.  Of note, patient also had metastatic breast cancer in her surgical specimen from her oophorectomy.  Continue dose reduced Kisqali  400 mg 21 days with 7  days off.  Proceed with fulvestrant  today.  Return to clinic in 4 weeks for laboratory work and evaluation by clinical pharmacy.  Will repeat PET scan in 6 weeks.  Patient with then return to clinic in 8 weeks for further evaluation and continuation of treatment.  Appreciate clinical pharmacy input.   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  Follow-up with gynecology oncology as scheduled. Multiple sclerosis: Chronic and unchanged.  Continue evaluation and treatment per neurology.  Recent MRI of the brain did not reveal any new lesions. Renal insufficiency: Patient's GFR is 28 today.  Dose reduce Kisqali  as above.  Monitor if GFR remains persistently less than 30. Anemia: Chronic and unchanged.  Patient's hemoglobin is 9.5 today.  Macrocytosis: Likely secondary to underlying treatments. Reflux: Patient  does not complain of this today.  Continue omeprazole .   Nausea and vomiting: Patient was given a refill of ondansetron  today.  Patient expressed understanding and was in agreement with this plan. She also understands that She can call clinic at any time with any questions, concerns, or complaints.   Breast cancer bilateral, multifocal ER/PR pos, Her 2 neg.   Staging form: Breast, AJCC 7th Edition     Clinical: Stage IIB (T2, N1, M0) - Signed by Vester Clayman, MD on 04/22/2015   Evalene JINNY Reusing, MD   06/22/2024 8:57 AM

## 2024-06-23 ENCOUNTER — Other Ambulatory Visit: Payer: Self-pay | Admitting: Family Medicine

## 2024-06-23 DIAGNOSIS — E1169 Type 2 diabetes mellitus with other specified complication: Secondary | ICD-10-CM

## 2024-06-23 DIAGNOSIS — R809 Proteinuria, unspecified: Secondary | ICD-10-CM

## 2024-06-23 LAB — CANCER ANTIGEN 27.29: CA 27.29: 60.9 U/mL — ABNORMAL HIGH (ref 0.0–38.6)

## 2024-06-23 NOTE — Telephone Encounter (Signed)
 Requested Prescriptions  Pending Prescriptions Disp Refills   lisinopril  (ZESTRIL ) 5 MG tablet [Pharmacy Med Name: LISINOPRIL  5 MG TABLET] 90 tablet 0    Sig: TAKE 1 TABLET (5 MG TOTAL) BY MOUTH DAILY.     Cardiovascular:  ACE Inhibitors Failed - 06/23/2024  2:19 PM      Failed - Cr in normal range and within 180 days    Creatinine  Date Value Ref Range Status  06/22/2024 1.88 (H) 0.44 - 1.00 mg/dL Final   Creat  Date Value Ref Range Status  10/20/2021 0.85 0.50 - 1.05 mg/dL Final   Creatinine, Urine  Date Value Ref Range Status  06/21/2023 74  Final         Passed - K in normal range and within 180 days    Potassium  Date Value Ref Range Status  06/22/2024 4.9 3.5 - 5.1 mmol/L Final  03/11/2015 3.4 (L) mmol/L Final    Comment:    3.5-5.1 NOTE: New Reference Range  01/29/15          Passed - Patient is not pregnant      Passed - Last BP in normal range    BP Readings from Last 1 Encounters:  06/22/24 114/60         Passed - Valid encounter within last 6 months    Recent Outpatient Visits           1 month ago Type 2 diabetes mellitus with other specified complication, without long-term current use of insulin  [facility])   [place] [facility] Edman Marsa PARAS, DO   5 months ago Centrilobular emphysema [facility])   [place] [facility] Edman Marsa PARAS, DO       Future Appointments             In 6 days Loistine, Barnie, NP [facility] [place]   In 2 months Hester Alm BROCKS, MD [facility]

## 2024-06-27 NOTE — Progress Notes (Unsigned)
 Cardiology Clinic Note   Date: 06/29/2024 ID: Siona, Coulston October 12, 1951, MRN [id number]  Primary Cardiologist:  Deatrice Cage, MD  Chief Complaint   Cassidy Norris is a 73 y.o. female who presents to the clinic today for evaluation of DOE.   Patient Profile   Cassidy Norris is followed by Dr. Cage for the history outlined below.      Past medical history significant for: DOE. Echo 11/15/2023: EF 55 to 60%.  No RWMA.  Normal diastolic parameters.  Normal RV size/function.  Mild MR. Aortic atherosclerosis. Palpitations/tachycardia. 7-day ZIO 11/05/2023: HR 62 to 245 bpm, average 96 bpm.  Predominantly sinus rhythm.  15 runs of SVT fastest 5 beats max rate 245 bpm, longest 14.1 seconds average rate 107 bpm.  Occasional PACs (4.2% burden). Hypertension. Hyperlipidemia. Lipid panel 10/25/2023: HDL 44, TG 710, total 252. Direct LDL 11/29/2023: LDL 133. GERD. T2DM. COPD.  MS. CKD stage IV. Breast cancer. S/p bilateral mastectomy followed by chemotherapy completed in September 2015. 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 Former tobacco abuse.  In summary, patient was first evaluated by Dr. Cage on 04/19/2015 for evaluation of tachycardia.  Patient reported noting sinus tachycardia in March.  She reported worsening exertional dyspnea with no orthopnea or PND and trace lower extremity edema.  Echo in June 2016 showed EF 55 to 60%, no RWMA, Grade I DD, mild LAE, PA pressure upper limits of normal, no significant valvular abnormalities.  Patient was started on small dose of carvedilol  with overall improvement in heart rate.  Patient reestablish care with Dr. Cage on 10/19/2023 for sinus tachycardia.  She reported being on carvedilol  for a few years then switched to lisinopril  for renal protection secondary to her diabetes.  She reported a 6 to 35-month history of intermittent sinus tachycardia especially when at the doctor's  office.  She reported relatively stable DOE.  She had an echo which showed normal LV/RV function.  7-day ZIO showed 15 runs of SVT longest lasting 14 seconds.  Patient was last seen in the office by Dr. Cage on 01/07/2024 for follow-up after testing.  She was started on a small dose of metoprolol  twice a day with improvement in symptoms.     History of Present Illness    Today, patient reports a 3-4 month history of dyspnea with minimal exertion causing her to stop and catch her breath before resuming activity. She denies associated chest pain or palpitations with dyspnea. She does have occasional palpitations. She has noticed that when she is out in the heat her heart rate will elevate typically to 110-120 bpm and one time as high as 144 bpm. Her activity is limited secondary to back pain and knee pain. Discussed factors such as anemia and deconditioning could be contributing to her symptoms.      ROS: All other systems reviewed and are otherwise negative except as noted in History of Present Illness.  EKGs/Labs Reviewed    EKG Interpretation Date/Time:  Thursday June 29 2024 10:29:03 EDT Ventricular Rate:  86 PR Interval:  138 QRS Duration:  70 QT Interval:  362 QTC Calculation: 433 R Axis:   -25  Text Interpretation: Normal sinus rhythm When compared with ECG of 19-Oct-2023 14:18, No significant change was found Confirmed by Loistine Sober [id number]) on 06/29/2024 10:32:10 AM   06/22/2024: ALT 33; AST 36; BUN 32; Creatinine 1.88; Potassium 4.9; Sodium 141   06/22/2024: Hemoglobin 9.5; WBC Count 3.9  10/25/2023: TSH 1.00    Physical Exam    VS:  BP 120/70 (BP Location: Left Arm, Patient Position: Sitting, Cuff Size: Normal)   Pulse 86   Ht 5' 5 (1.651 m)   Wt 202 lb 12.8 oz (92 kg)   LMP 12/22/1999 (Approximate)   SpO2 99%   BMI 33.75 kg/m  , BMI Body mass index is 33.75 kg/m.  GEN: Well nourished, well developed, in no acute distress. Neck: No JVD or carotid  bruits. Cardiac:  RRR. 1/6 systolic murmur. No rubs or gallops.   Respiratory:  Respirations regular and unlabored. Clear to auscultation without rales, wheezing or rhonchi. GI: Soft, nontender, nondistended. Extremities: Radials/DP/PT 2+ and equal bilaterally. No clubbing or cyanosis. No edema.  Skin: Warm and dry, no rash. Neuro: Strength intact.  Assessment & Plan   DOE Echo December 2024 demonstrated normal LV/RV function, normal diastolic parameters, mild MR. Patient reports a 3-4 month history of increased dyspnea with minimal exertion causing her to stop and catch her breath. She is mostly sedentary. Discussed this could be a contributing factor. If echo remains normal discussed increasing physical activity with a seated exercise program.  - Update echo.   Palpitations/PSVT 7-day ZIO December 2024 showed HR 62 to 245 bpm, average 96 bpm, predominantly sinus rhythm, 15 runs of SVT fastest 5 beats max rate 245 bpm, longest 14.1 seconds average rate 107 bpm, occasional PACs.  Patient reports palpitations are well controlled on current dose of metoprolol . She does note increased heart rate when out in the heat with HR to 110-120 and one time as high as 144 bpm. Heart rate will return to normal once she cools off.  - Continue metoprolol . Take extra 1/2 tablet of metoprolol  for prolonged palpitations.   Hypertension BP today 120/70. No report of headache or dizziness.  - Continue lisinopril , metoprolol .  Disposition: Echo. Return in 6 months or sooner as needed.       Signed, Barnie HERO. Nicholette Dolson, DNP, NP-C

## 2024-06-29 ENCOUNTER — Ambulatory Visit: Admitting: Oncology

## 2024-06-29 ENCOUNTER — Ambulatory Visit: Attending: Student | Admitting: Student

## 2024-06-29 ENCOUNTER — Other Ambulatory Visit

## 2024-06-29 ENCOUNTER — Encounter: Payer: Self-pay | Admitting: Student

## 2024-06-29 ENCOUNTER — Ambulatory Visit

## 2024-06-29 VITALS — BP 120/70 | HR 86 | Ht 65.0 in | Wt 202.8 lb

## 2024-06-29 DIAGNOSIS — R002 Palpitations: Secondary | ICD-10-CM | POA: Diagnosis not present

## 2024-06-29 DIAGNOSIS — R000 Tachycardia, unspecified: Secondary | ICD-10-CM | POA: Insufficient documentation

## 2024-06-29 DIAGNOSIS — I10 Essential (primary) hypertension: Secondary | ICD-10-CM | POA: Insufficient documentation

## 2024-06-29 DIAGNOSIS — I471 Supraventricular tachycardia, unspecified: Secondary | ICD-10-CM | POA: Insufficient documentation

## 2024-06-29 DIAGNOSIS — R0609 Other forms of dyspnea: Secondary | ICD-10-CM | POA: Insufficient documentation

## 2024-06-29 NOTE — Patient Instructions (Signed)
 Medication Instructions:   Your physician recommends that you continue on your current medications as directed. Please refer to the Current Medication list given to you today.   *If you need a refill on your cardiac medications before your next appointment, please call your pharmacy*  Lab Work:  No labs ordered today   If you have labs (blood work) drawn today and your tests are completely normal, you will receive your results only by: MyChart Message (if you have MyChart) OR A paper copy in the mail If you have any lab test that is abnormal or we need to change your treatment, we will call you to review the results.  Testing/Procedures:  Your physician has requested that you have an echocardiogram. Echocardiography is a painless test that uses sound waves to create images of your heart. It provides your doctor with information about the size and shape of your heart and how well your heart's chambers and valves are working.   You may receive an ultrasound enhancing agent through an IV if needed to better visualize your heart during the echo. This procedure takes approximately one hour.  There are no restrictions for this procedure.  This will take place at [address] [facility]) [address], [place] [address]  Please note: We ask at that you not bring children with you during ultrasound (echo/ vascular) testing. Due to room size and safety concerns, children are not allowed in the ultrasound rooms during exams. Our front office staff cannot provide observation of children in our lobby area while testing is being conducted. An adult accompanying a patient to their appointment will only be allowed in the ultrasound room at the discretion of the ultrasound technician under special circumstances. We apologize for any inconvenience.   Follow-Up: At [facility], you and your health needs are our priority.  As part of our continuing mission to provide you with exceptional  heart care, our providers are all part of one team.  This team includes your primary Cardiologist (physician) and Advanced Practice Providers or APPs (Physician Assistants and Nurse Practitioners) who all work together to provide you with the care you need, when you need it.  Your next appointment:   6 month(s)  Provider:   You may see Deatrice Cage, MD or one of the following Advanced Practice Providers on your designated Care Team:    Barnie Hila, NP   We recommend signing up for the patient portal called MyChart.  Sign up information is provided on this After Visit Summary.  MyChart is used to connect with patients for Virtual Visits (Telemedicine).  Patients are able to view lab/test results, encounter notes, upcoming appointments, etc.  Non-urgent messages can be sent to your provider as well.   To learn more about what you can do with MyChart, go to [email or web address].

## 2024-07-03 ENCOUNTER — Other Ambulatory Visit: Payer: Self-pay | Admitting: Oncology

## 2024-07-03 ENCOUNTER — Other Ambulatory Visit (HOSPITAL_COMMUNITY): Payer: Self-pay

## 2024-07-03 ENCOUNTER — Other Ambulatory Visit: Payer: Self-pay

## 2024-07-03 ENCOUNTER — Encounter (INDEPENDENT_AMBULATORY_CARE_PROVIDER_SITE_OTHER): Payer: Self-pay

## 2024-07-03 DIAGNOSIS — C50919 Malignant neoplasm of unspecified site of unspecified female breast: Secondary | ICD-10-CM

## 2024-07-03 NOTE — Progress Notes (Signed)
 Specialty Pharmacy Refill Coordination Note  MyChart Questionnaire Submission  Cassidy Norris is a 73 y.o. female contacted today regarding refills of specialty medication(s) Kisqali .  Doses on hand: (Patient-Rptd) 9 days   Next cycle: 07/20/24  Patient requested: (Patient-Rptd) Delivery   Delivery date: 07/14/24   Verified address: [address] [place] [address]  Medication will be filled on 07/13/24.

## 2024-07-04 ENCOUNTER — Other Ambulatory Visit: Payer: Self-pay

## 2024-07-04 ENCOUNTER — Encounter: Payer: Self-pay | Admitting: Oncology

## 2024-07-04 MED ORDER — RIBOCICLIB SUCC (400 MG DOSE) 200 MG PO TBPK
400.0000 mg | ORAL_TABLET | Freq: Every day | ORAL | 1 refills | Status: DC
  Filled 2024-07-04: qty 42, 28d supply, fill #0
  Filled 2024-08-04: qty 42, 28d supply, fill #1

## 2024-07-07 ENCOUNTER — Encounter: Payer: Self-pay | Admitting: Oncology

## 2024-07-11 ENCOUNTER — Ambulatory Visit: Attending: Student

## 2024-07-11 ENCOUNTER — Ambulatory Visit: Payer: Self-pay | Admitting: Student

## 2024-07-11 DIAGNOSIS — R0609 Other forms of dyspnea: Secondary | ICD-10-CM | POA: Insufficient documentation

## 2024-07-11 LAB — ECHOCARDIOGRAM COMPLETE
AR max vel: 1.32 cm2
AV Area VTI: 1.44 cm2
AV Area mean vel: 1.32 cm2
AV Mean grad: 10 mmHg
AV Peak grad: 17.8 mmHg
Ao pk vel: 2.11 m/s
Area-P 1/2: 5.23 cm2
S' Lateral: 2.89 cm

## 2024-07-12 NOTE — Progress Notes (Signed)
 Last read by [name] at 7:36PM on 07/11/2024.

## 2024-07-13 ENCOUNTER — Encounter: Payer: Self-pay | Admitting: Oncology

## 2024-07-14 ENCOUNTER — Telehealth: Payer: Self-pay | Admitting: Neurology

## 2024-07-14 NOTE — Telephone Encounter (Signed)
 Pt said she needs jaffe to send the DX to the co pay card people so she can get her payment

## 2024-07-14 NOTE — Telephone Encounter (Signed)
 Telephone call to patient. Please explain who is needing something from [facility] and for what.   Per patient Copay Release company needs a DX for the patient. They received information last year and when requested it this year we never sent it back.  Advised patient unsure what the form is if that can be resent over we have Dr.Jaffe review it.    Patient gave phone number to the Release company [phone] Patient I.D # [phone]

## 2024-07-18 NOTE — Telephone Encounter (Signed)
 Forms on your desk.

## 2024-07-18 NOTE — Telephone Encounter (Signed)
 Release company [id number] Patient I.D # [id number]   Spoke to rep forms faxed over on 8/21.  Will refax them.   Forms are due back by 9/6.

## 2024-07-20 ENCOUNTER — Ambulatory Visit: Payer: Medicare Other

## 2024-07-20 ENCOUNTER — Inpatient Hospital Stay: Admitting: Pharmacist

## 2024-07-20 ENCOUNTER — Inpatient Hospital Stay

## 2024-07-20 ENCOUNTER — Inpatient Hospital Stay: Attending: Oncology

## 2024-07-20 VITALS — Ht 65.0 in | Wt 202.0 lb

## 2024-07-20 DIAGNOSIS — C50912 Malignant neoplasm of unspecified site of left female breast: Secondary | ICD-10-CM | POA: Insufficient documentation

## 2024-07-20 DIAGNOSIS — Z1732 Human epidermal growth factor receptor 2 negative status: Secondary | ICD-10-CM | POA: Insufficient documentation

## 2024-07-20 DIAGNOSIS — C50919 Malignant neoplasm of unspecified site of unspecified female breast: Secondary | ICD-10-CM

## 2024-07-20 DIAGNOSIS — Z5111 Encounter for antineoplastic chemotherapy: Secondary | ICD-10-CM | POA: Insufficient documentation

## 2024-07-20 DIAGNOSIS — C50911 Malignant neoplasm of unspecified site of right female breast: Secondary | ICD-10-CM | POA: Diagnosis not present

## 2024-07-20 DIAGNOSIS — Z1731 Human epidermal growth factor receptor 2 positive status: Secondary | ICD-10-CM | POA: Insufficient documentation

## 2024-07-20 DIAGNOSIS — Z0000 Encounter for general adult medical examination without abnormal findings: Secondary | ICD-10-CM

## 2024-07-20 DIAGNOSIS — C778 Secondary and unspecified malignant neoplasm of lymph nodes of multiple regions: Secondary | ICD-10-CM | POA: Diagnosis not present

## 2024-07-20 DIAGNOSIS — Z170 Estrogen receptor positive status [ER+]: Secondary | ICD-10-CM | POA: Insufficient documentation

## 2024-07-20 LAB — CBC WITH DIFFERENTIAL (CANCER CENTER ONLY)
Abs Immature Granulocytes: 0.01 K/uL (ref 0.00–0.07)
Basophils Absolute: 0.1 K/uL (ref 0.0–0.1)
Basophils Relative: 2 %
Eosinophils Absolute: 0.2 K/uL (ref 0.0–0.5)
Eosinophils Relative: 4 %
HCT: 29.6 % — ABNORMAL LOW (ref 36.0–46.0)
Hemoglobin: 9.8 g/dL — ABNORMAL LOW (ref 12.0–15.0)
Immature Granulocytes: 0 %
Lymphocytes Relative: 28 %
Lymphs Abs: 1.2 K/uL (ref 0.7–4.0)
MCH: 35.9 pg — ABNORMAL HIGH (ref 26.0–34.0)
MCHC: 33.1 g/dL (ref 30.0–36.0)
MCV: 108.4 fL — ABNORMAL HIGH (ref 80.0–100.0)
Monocytes Absolute: 0.7 K/uL (ref 0.1–1.0)
Monocytes Relative: 15 %
Neutro Abs: 2.3 K/uL (ref 1.7–7.7)
Neutrophils Relative %: 51 %
Platelet Count: 186 K/uL (ref 150–400)
RBC: 2.73 MIL/uL — ABNORMAL LOW (ref 3.87–5.11)
RDW: 14.4 % (ref 11.5–15.5)
WBC Count: 4.5 K/uL (ref 4.0–10.5)
nRBC: 0 % (ref 0.0–0.2)

## 2024-07-20 LAB — CMP (CANCER CENTER ONLY)
ALT: 35 U/L (ref 0–44)
AST: 44 U/L — ABNORMAL HIGH (ref 15–41)
Albumin: 3.5 g/dL (ref 3.5–5.0)
Alkaline Phosphatase: 79 U/L (ref 38–126)
Anion gap: 10 (ref 5–15)
BUN: 24 mg/dL — ABNORMAL HIGH (ref 8–23)
CO2: 24 mmol/L (ref 22–32)
Calcium: 9.2 mg/dL (ref 8.9–10.3)
Chloride: 106 mmol/L (ref 98–111)
Creatinine: 1.64 mg/dL — ABNORMAL HIGH (ref 0.44–1.00)
GFR, Estimated: 33 mL/min — ABNORMAL LOW (ref >60)
Glucose, Bld: 130 mg/dL — ABNORMAL HIGH (ref 70–99)
Potassium: 4.8 mmol/L (ref 3.5–5.1)
Sodium: 140 mmol/L (ref 135–145)
Total Bilirubin: 0.6 mg/dL (ref 0.0–1.2)
Total Protein: 7 g/dL (ref 6.5–8.1)

## 2024-07-20 MED ORDER — FULVESTRANT 250 MG/5ML IM SOSY
500.0000 mg | PREFILLED_SYRINGE | Freq: Once | INTRAMUSCULAR | Status: AC
  Administered 2024-07-20: 500 mg via INTRAMUSCULAR
  Filled 2024-07-20: qty 10

## 2024-07-20 NOTE — Progress Notes (Signed)
 Subjective:   Cassidy Norris is a 73 y.o. who presents for a Medicare Wellness preventive visit.  As a reminder, Annual Wellness Visits don't include a physical exam, and some assessments may be limited, especially if this visit is performed virtually. We may recommend an in-person follow-up visit with your provider if needed.  Visit Complete: Virtual I connected with  Cassidy Norris on 07/20/24 by a audio enabled telemedicine application and verified that I am speaking with the correct person using two identifiers.  Patient Location: Home  Provider Location: Home Office  I discussed the limitations of evaluation and management by telemedicine. The patient expressed understanding and agreed to proceed.  Vital Signs: Because this visit was a virtual/telehealth visit, some criteria may be missing or patient reported. Any vitals not documented were not able to be obtained and vitals that have been documented are patient reported.    Persons Participating in Visit: Patient.  AWV Questionnaire: No: Patient Medicare AWV questionnaire was not completed prior to this visit.  Cardiac Risk Factors include: advanced age (>49men, >57 women);diabetes mellitus;hypertension     Objective:    Today's Vitals   07/20/24 0818  Weight: 202 lb (91.6 kg)  Height: 5' 5 (1.651 m)   Body mass index is 33.61 kg/m.     07/20/2024    8:24 AM 06/22/2024    8:42 AM 05/25/2024   10:29 AM 04/27/2024   10:27 AM 03/02/2024   10:16 AM 01/06/2024   10:03 AM 12/29/2023    8:28 AM  Advanced Directives  Does Patient Have a Medical Advance Directive? Yes Yes Yes Yes Yes Yes Yes  Type of Estate agent of [place];Living will Living will;Healthcare Power of [employer] Power of [place];Living will Healthcare Power of [place];Living will Healthcare Power of [place];Living will  Healthcare Power of [place];Living will  Does patient want to make changes to medical advance directive?  No - Patient declined Yes (ED - Information included in AVS) No - Patient declined      Copy of Healthcare Power of Attorney in Chart? Yes - validated most recent copy scanned in chart (See row information)  Yes - validated most recent copy scanned in chart (See row information)      Would patient like information on creating a medical advance directive?  Yes (ED - Information included in AVS) No - Patient declined        Current Medications (verified) Outpatient Encounter Medications as of 07/20/2024  Medication Sig   acetaminophen  (TYLENOL ) 500 MG tablet Take 1,000 mg by mouth 2 (two) times daily as needed for moderate pain or headache.   baclofen  (LIORESAL ) 10 MG tablet TAKE 1 TABLET BY MOUTH THREE TIMES A DAY AS NEEDED FOR MUSCLE SPASMS   calcium  carbonate (OSCAL) 1500 (600 Ca) MG TABS tablet Take by mouth 2 (two) times daily with a meal.   dapagliflozin propanediol (FARXIGA) 10 MG TABS tablet Take 10 mg by mouth daily.   diclofenac  Sodium (VOLTAREN ) 1 % GEL Apply 2 g topically 3 (three) times daily as needed.   ezetimibe  (ZETIA ) 10 MG tablet Take 1 tablet (10 mg total) by mouth daily.   fluticasone  (FLONASE ) 50 MCG/ACT nasal spray PLACE 2 SPRAYS INTO BOTH NOSTRILS AT BEDTIME.   fulvestrant  (FASLODEX ) 250 MG/5ML injection Inject 500 mg into the muscle every 30 (thirty) days. One injection each buttock over 1-2 minutes. Warm prior to use.   Krill Oil 1000 MG CAPS Take 1,000 mg by mouth daily.  lisinopril  (ZESTRIL ) 5 MG tablet TAKE 1 TABLET (5 MG TOTAL) BY MOUTH DAILY.   loperamide  (IMODIUM ) 2 MG capsule TAKE 2 TABLETS (4 MG TOTAL) BY MOUTH 4 (FOUR) TIMES DAILY AS NEEDED FOR DIARRHEA OR LOOSE STOOLS.   metFORMIN  (GLUCOPHAGE ) 500 MG tablet TAKE 1 TABLET BY MOUTH 2 TIMES DAILY WITH A MEAL.   metoprolol  tartrate (LOPRESSOR ) 25 MG tablet TAKE 1/2 TABLET TWICE A DAY BY MOUTH   omeprazole  (PRILOSEC) 40 MG capsule TAKE 1 CAPSULE BY MOUTH TWICE A DAY   ondansetron  (ZOFRAN ) 8 MG tablet Take 1 tablet  (8 mg total) by mouth every 8 (eight) hours as needed for nausea or vomiting.   prochlorperazine  (COMPAZINE ) 10 MG tablet Take 1 tablet (10 mg total) by mouth every 6 (six) hours as needed for nausea or vomiting.   ribociclib  succ (KISQALI  400MG  DAILY DOSE) 200 MG Therapy Pack Take 2 tablets (400 mg total) by mouth daily. Take for 21 days on, 7 days off, repeat every 28 days.   sodium chloride  (OCEAN) 0.65 % SOLN nasal spray Place 1 spray into both nostrils as needed for congestion.   traZODone  (DESYREL ) 50 MG tablet TAKE 1 TABLET (50 MG TOTAL) BY MOUTH AT BEDTIME AS NEEDED. FOR SLEEP   venlafaxine  XR (EFFEXOR -XR) 75 MG 24 hr capsule Take 1 capsule (75 mg total) by mouth daily with breakfast.   vitamin B-12 (CYANOCOBALAMIN ) 1000 MCG tablet Take 2,500 mcg by mouth every Monday, Wednesday, and Friday.   Vitamin D , Cholecalciferol , 50 MCG (2000 UT) CAPS Take 2,000 Units by mouth every Monday, Wednesday, and Friday.   No facility-administered encounter medications on file as of 07/20/2024.    Allergies (verified) Patient has no known allergies.   History: Past Medical History:  Diagnosis Date   Actinic keratosis    Anemia    Arthritis not really diagnosis but have pain   B12 deficiency 07/23/2016   Will check levels to determine if injections are needed again. Await results. Recent CBC at [facility]   Blood transfusion without reported diagnosis 1983 had a miscarriage/dnc   Cancer (HCC) 12/30/2013   Multifocal disease: pT1c,m, N0(isolated tumor cells) Er/ PR positive, Her 2 negative.   Cancer (HCC) 01/09/2014   Invasive lobular carcinoma, 2.2 cm, T2,N1 ER/PR positive, HER-2/neu negative   Cataract 2014?   Centrilobular emphysema (HCC) 10/27/2021   CKD (chronic kidney disease), stage IV (HCC) 07/13/2023   Complication of anesthesia 03/06/2022   Prolonged metabolism of Rocuronium  during case.   Diabetes mellitus without complication (HCC)    Diffuse cystic mastopathy    Erosive esophagitis     Essential hypertension, benign 09/24/2017   Family history of malignant neoplasm of gastrointestinal tract 2012   Fractured elbow 08/28/2014   Gastroesophageal reflux disease    Hx of metabolic acidosis with increased anion gap    Increased heart rate    Multiple sclerosis (HCC)    Neuromuscular disorder (HCC)    neuropathy in bil feet - left is worse.   Obesity, unspecified    Peripheral neuropathy    Personal history of tobacco use, presenting hazards to health    Restless leg syndrome    Sleep apnea    uses CPAP   Special screening for malignant neoplasms, colon    Squamous cell carcinoma of skin 08/24/2022   SCCIS - scalp, ED&C   Squamous cell carcinoma of skin 07/20/2023   SCC IS right of midline ant scalp post, tx with ED&C   Squamous cell carcinoma of  skin 07/20/2023   SCC IS right of midline ant scalp ant, tx with [facility]   Past Surgical History:  Procedure Laterality Date   BREAST BIOPSY Right 1973,2001   BREAST BIOPSY Right 11/07/2013   INVASIVE MAMMARY CARCINOMA , ER/PR positive Her2 negative   BREAST BIOPSY Left 11/27/2013   invasive lobular and DCIS   BREAST SURGERY Bilateral 01/09/2014   mastectomy   cataract surgery Left 08/13/2022   CHOLECYSTECTOMY     COLONOSCOPY  2010   Dr. Dellie   COLONOSCOPY WITH PROPOFOL  N/A 06/11/2015   Procedure: COLONOSCOPY WITH PROPOFOL ;  Surgeon: Louanne KANDICE Dellie, MD;  Location: [facility] ENDOSCOPY;  Service: Endoscopy;  Laterality: N/A;   COLONOSCOPY WITH PROPOFOL  N/A 12/23/2021   Procedure: COLONOSCOPY WITH PROPOFOL ;  Surgeon: Unk Corinn Skiff, MD;  Location: [facility] ENDOSCOPY;  Service: Gastroenterology;  Laterality: N/A;   DILATATION & CURETTAGE/HYSTEROSCOPY WITH MYOSURE N/A 07/12/2018   Procedure: DILATATION & CURETTAGE/HYSTEROSCOPY WITH MYOSURE WITH ENDOMETRIAL POLYPECTOMY;  Surgeon: Leonce Garnette BIRCH, MD;  Location: [facility] ORS;  Service: Gynecology;  Laterality: N/A;   DILATION AND CURETTAGE OF UTERUS  1983    ESOPHAGOGASTRODUODENOSCOPY N/A 12/23/2021   Procedure: ESOPHAGOGASTRODUODENOSCOPY (EGD);  Surgeon: Unk Corinn Skiff, MD;  Location: [facility] ENDOSCOPY;  Service: Gastroenterology;  Laterality: N/A;   JOINT REPLACEMENT Left 11/23/2015   right  2023   KNEE ARTHROPLASTY Right 03/06/2022   Procedure: COMPUTER ASSISTED TOTAL KNEE ARTHROPLASTY;  Surgeon: Mardee Lynwood SQUIBB, MD;  Location: [facility] ORS;  Service: Orthopedics;  Laterality: Right;   LAPAROSCOPIC BILATERAL SALPINGO OOPHERECTOMY N/A 08/04/2023   Procedure: LAPAROSCOPIC BILATERAL SALPINGO OOPHORECTOMY;  Surgeon: Mancil Barter, MD;  Location: [facility] ORS;  Service: Gynecology;  Laterality: N/A;   POLYPECTOMY  1989   PORTA CATH REMOVAL     PORTACATH PLACEMENT  2015   SKIN BIOPSY     on scalp   SPINE SURGERY  09/14/2014   Ruptured disk L4- L5   TOTAL KNEE ARTHROPLASTY Left 11/22/2015   Procedure: TOTAL KNEE ARTHROPLASTY;  Surgeon: Toribio Silos, MD;  Location: [facility] OR;  Service: Orthopedics;  Laterality: Left;   TUBAL LIGATION     WISDOM TOOTH EXTRACTION  2006   Family History  Problem Relation Age of Onset   Lung cancer Mother 80   COPD Mother    Colon cancer Father 74   Stomach cancer Father 53   Hypertension Sister    Breast cancer Maternal Aunt        dx over 83   Breast cancer Paternal Aunt        dx 10s   Breast cancer Paternal Aunt        dx 52s   Rectal cancer Paternal Uncle        dx 38s   Rectal cancer Paternal Uncle        dx 24s   Diabetes Maternal Grandmother    Pancreatic cancer Paternal Grandfather    Rectal cancer Cousin        dx 7s-60s   Rectal cancer Cousin        dx >50   Social History   Socioeconomic History   Marital status: Widowed    Spouse name: Not on file   Number of children: Not on file   Years of education: some college   Highest education level: 12th grade  Occupational History   Occupation: retired  Tobacco Use   Smoking status: Former    Current packs/day: 0.00    Average packs/day:  1 pack/day for 30.0 years (30.0  ttl pk-yrs)    Types: Cigarettes    Start date: 12/12/1983    Quit date: 12/11/2013    Years since quitting: 10.6   Smokeless tobacco: Former  Building services engineer status: Never Used  Substance and Sexual Activity   Alcohol use: Not Currently   Drug use: No   Sexual activity: Not Currently    Birth control/protection: Post-menopausal  Other Topics Concern   Not on file  Social History Narrative   Right handed   Drinks caffeine   One story home   Social Drivers of Health   Financial Resource Strain: Low Risk  (07/20/2024)   Overall Financial Resource Strain (CARDIA)    Difficulty of Paying Living Expenses: Not hard at all  Food Insecurity: No Food Insecurity (07/20/2024)   Hunger Vital Sign    Worried About Running Out of Food in the Last Year: Never true    Ran Out of Food in the Last Year: Never true  Transportation Needs: No Transportation Needs (07/20/2024)   PRAPARE - Administrator, Civil Service (Medical): No    Lack of Transportation (Non-Medical): No  Physical Activity: Inactive (07/20/2024)   Exercise Vital Sign    Days of Exercise per Week: 0 days    Minutes of Exercise per Session: 0 min  Stress: No Stress Concern Present (07/20/2024)   [employer] of Occupational Health - Occupational Stress Questionnaire    Feeling of Stress: Not at all  Social Connections: Moderately Integrated (07/20/2024)   Social Connection and Isolation Panel    Frequency of Communication with Friends and Family: More than three times a week    Frequency of Social Gatherings with Friends and Family: More than three times a week    Attends Religious Services: More than 4 times per year    Active Member of [employer] or Organizations: Yes    Attends Banker Meetings: More than 4 times per year    Marital Status: Widowed  Recent Concern: Social Connections - Moderately Isolated (07/16/2024)   Social Connection and Isolation Panel     Frequency of Communication with Friends and Family: More than three times a week    Frequency of Social Gatherings with Friends and Family: Twice a week    Attends Religious Services: More than 4 times per year    Active Member of [employer] or Organizations: No    Attends Banker Meetings: Not on file    Marital Status: Widowed    Tobacco Counseling Counseling given: Not Answered    Clinical Intake:  Pre-visit preparation completed: Yes  Pain : No/denies pain     BMI - recorded: 33.61 Nutritional Status: BMI > 30  Obese Nutritional Risks: None Diabetes: Yes CBG done?: No Did pt. bring in CBG monitor from home?: No  Lab Results  Component Value Date   HGBA1C 6.0 (A) 05/02/2024   HGBA1C 6.0 (H) 10/25/2023   HGBA1C 5.8 (A) 04/30/2023     How often do you need to have someone help you when you read instructions, pamphlets, or other written materials from your doctor or pharmacy?: 1 - Never  Interpreter Needed?: No  Information entered by :: Rojelio Blush LPN   Activities of Daily Living     07/20/2024    8:23 AM 09/28/2023    2:30 PM  In your present state of health, do you have any difficulty performing the following activities:  Hearing? 0 1  Vision? 0 1  Difficulty concentrating  or making decisions? 0 1  Walking or climbing stairs? 1 1  Comment Uses a Walker   Dressing or bathing? 0 0  Doing errands, shopping? 0 0  Preparing Food and eating ? N   Using the Toilet? N   In the past six months, have you accidently leaked urine? N   Do you have problems with loss of bowel control? N   Managing your Medications? N   Managing your Finances? N   Housekeeping or managing your Housekeeping? N     Patient Care Team: Edman Marsa PARAS, DO as PCP - General (Family Medicine) Darron Deatrice LABOR, MD as PCP - Cardiology (Cardiology) Skeet Juliene SAUNDERS, DO as Consulting Physician (Neurology) Jacobo Evalene PARAS, MD as Consulting Physician  (Oncology) Maurie Rayfield BIRCH, RN as Oncology Nurse Navigator Mevelyn BIRCH Bathe, OD (Optometry)  I have updated your Care Teams any recent Medical Services you may have received from other providers in the past year.     Assessment:   This is a routine wellness examination for Cassidy Norris.  Hearing/Vision screen Hearing Screening - Comments:: Denies hearing difficulties   Vision Screening - Comments:: Wears rx glasses - up to date with routine eye exams with  Dr Mevelyn   Goals Addressed               This Visit's Progress     Increase physical activity (pt-stated)        Get more active       Depression Screen     07/20/2024    8:22 AM 05/25/2024   10:25 AM 05/02/2024    8:58 AM 11/02/2023    9:24 AM 09/28/2023    2:30 PM 07/15/2023    8:33 AM 07/13/2023    8:40 AM  PHQ 2/9 Scores  PHQ - 2 Score 0 0 0 0 1 0 0  PHQ- 9 Score   9 4 3  0 2    Fall Risk     07/20/2024    8:23 AM 05/02/2024    8:58 AM 12/29/2023    8:27 AM 11/02/2023    9:24 AM 09/28/2023    2:30 PM  Fall Risk   Falls in the past year? 1 1 0 0 0  Number falls in past yr: 0 0 0  0  Injury with Fall? 0 0 0  0  Risk for fall due to : No Fall Risks      Follow up Falls evaluation completed  Falls evaluation completed      MEDICARE RISK AT HOME:  Medicare Risk at Home Any stairs in or around the home?: No If so, are there any without handrails?: No Home free of loose throw rugs in walkways, pet beds, electrical cords, etc?: Yes Adequate lighting in your home to reduce risk of falls?: Yes Life alert?: Yes Use of a cane, walker or w/c?: Yes Grab bars in the bathroom?: No Shower chair or bench in shower?: Yes Elevated toilet seat or a handicapped toilet?: Yes  TIMED UP AND GO:  Was the test performed?  No  Cognitive Function: 6CIT completed    02/19/2015    4:18 PM  MMSE - Mini Mental State Exam  Orientation to time 5   Orientation to Place 5   Registration 3   Attention/ Calculation 4   Recall 3    Language- name 2 objects 2   Language- repeat 1  Language- follow 3 step command 3   Language- read & follow direction  1   Write a sentence 1   Copy design 1   Total score 29      Data saved with a previous flowsheet row definition        07/20/2024    8:24 AM 07/15/2023    8:37 AM 05/09/2019    8:52 AM  6CIT Screen  What Year? 0 points 0 points 0 points  What month? 0 points 0 points 0 points  What time? 0 points 0 points 0 points  Count back from 20 0 points 0 points 0 points  Months in reverse 0 points 0 points 0 points  Repeat phrase 0 points 0 points 0 points  Total Score 0 points 0 points 0 points    Immunizations Immunization History  Administered Date(s) Administered   Influenza-Unspecified 08/12/2016, 08/25/2017   Tdap 07/22/2015    Screening Tests Health Maintenance  Topic Date Due   COVID-19 Vaccine (1) Never done   Zoster Vaccines- Shingrix (1 of 2) Never done   Diabetic kidney evaluation - Urine ACR  06/20/2024   INFLUENZA VACCINE  06/23/2024   Pneumococcal Vaccine: 50+ Years (1 of 2 - PCV) 11/01/2024 (Originally 09/11/1970)   FOOT EXAM  11/01/2024   HEMOGLOBIN A1C  11/01/2024   OPHTHALMOLOGY EXAM  11/25/2024   Lung Cancer Screening  02/20/2025   Diabetic kidney evaluation - eGFR measurement  06/22/2025   Medicare Annual Wellness (AWV)  07/20/2025   DTaP/Tdap/Td (2 - Td or Tdap) 07/21/2025   Colonoscopy  12/23/2026   DEXA SCAN  Completed   Hepatitis C Screening  Completed   HPV VACCINES  Aged Out   Meningococcal B Vaccine  Aged Out    Health Maintenance  Health Maintenance Due  Topic Date Due   COVID-19 Vaccine (1) Never done   Zoster Vaccines- Shingrix (1 of 2) Never done   Diabetic kidney evaluation - Urine ACR  06/20/2024   INFLUENZA VACCINE  06/23/2024   Health Maintenance Items Addressed:   Additional Screening:  Vision Screening: Recommended annual ophthalmology exams for early detection of glaucoma and other disorders of the  eye. Would you like a referral to an eye doctor? No    Dental Screening: Recommended annual dental exams for proper oral hygiene  Community Resource Referral / Chronic Care Management: CRR required this visit?  No   CCM required this visit?  No   Plan:    I have personally reviewed and noted the following in the patient's chart:   Medical and social history Use of alcohol, tobacco or illicit drugs  Current medications and supplements including opioid prescriptions. Patient is not currently taking opioid prescriptions. Functional ability and status Nutritional status Physical activity Advanced directives List of other physicians Hospitalizations, surgeries, and ER visits in previous 12 months Vitals Screenings to include cognitive, depression, and falls Referrals and appointments  In addition, I have reviewed and discussed with patient certain preventive protocols, quality metrics, and best practice recommendations. A written personalized care plan for preventive services as well as general preventive health recommendations were provided to patient.   Rojelio LELON Blush, LPN   1/71/7974   After Visit Summary: (MyChart) Due to this being a telephonic visit, the after visit summary with patients personalized plan was offered to patient via MyChart   Notes: Nothing significant to report at this time.

## 2024-07-20 NOTE — Progress Notes (Signed)
 Oral Chemotherapy Clinic [facility]  [phone] [phone]  Patient Care Team: Edman Marsa PARAS, DO as PCP - General (Family Medicine) Darron Deatrice LABOR, MD as PCP - Cardiology (Cardiology) Skeet Juliene SAUNDERS, DO as Consulting Physician (Neurology) Jacobo Evalene PARAS, MD as Consulting Physician (Oncology) Maurie Rayfield BIRCH, RN as Oncology Nurse Navigator Mevelyn BIRCH [place], [place] (Optometry)   Name of the patient: Cassidy Norris  [id number]  12-Jul-1951   Date of visit: 07/20/24  HPI: Patient is a 73 y.o. female with recurrent breast cancer, ER/PR positive/HER2 negative. Currently treating with Kisqali  (ribociclib ) and fulvestrant . She started ribociclib  on 01/08/22. Her treatment was held for surgery which was completed on 08/04/23. Patient resumed her ribociclib  on 08/19/23. Patient was dosed reduced to 400mg  dosing on 04/27/24 due to fatigue.  Reason for Consult: Oral chemotherapy follow-up for ribociclib  therapy.   PAST MEDICAL HISTORY: Past Medical History:  Diagnosis Date   Actinic keratosis    Anemia    Arthritis not really diagnosis but have pain   B12 deficiency 07/23/2016   Will check levels to determine if injections are needed again. Await results. Recent CBC at Onc WNL   Blood transfusion without reported diagnosis 1983 had a miscarriage/dnc   Cancer (HCC) 12/30/2013   Multifocal disease: pT1c,m, N0(isolated tumor cells) Er/ PR positive, Her 2 negative.   Cancer (HCC) 01/09/2014   Invasive lobular carcinoma, 2.2 cm, T2,N1 ER/PR positive, HER-2/neu negative   Cataract 2014?   Centrilobular emphysema (HCC) 10/27/2021   CKD (chronic kidney disease), stage IV (HCC) 07/13/2023   Complication of anesthesia 03/06/2022   Prolonged metabolism of Rocuronium  during case.   Diabetes mellitus without complication (HCC)    Diffuse cystic mastopathy    Erosive esophagitis    Essential hypertension, benign 09/24/2017   Family history of  malignant neoplasm of gastrointestinal tract 2012   Fractured elbow 08/28/2014   Gastroesophageal reflux disease    Hx of metabolic acidosis with increased anion gap    Increased heart rate    Multiple sclerosis (HCC)    Neuromuscular disorder (HCC)    neuropathy in bil feet - left is worse.   Obesity, unspecified    Peripheral neuropathy    Personal history of tobacco use, presenting hazards to health    Restless leg syndrome    Sleep apnea    uses CPAP   Special screening for malignant neoplasms, colon    Squamous cell carcinoma of skin 08/24/2022   SCCIS - scalp, ED&C   Squamous cell carcinoma of skin 07/20/2023   SCC IS right of midline ant scalp post, tx with ED&C   Squamous cell carcinoma of skin 07/20/2023   SCC IS right of midline ant scalp ant, tx with [facility]    HEMATOLOGY/ONCOLOGY HISTORY:  Oncology History Overview Note  1. Bilateral carcinoma of breast, February, 2015. Right breast status post radical mastectomy. T1 C. N0M0 (isolated tumor cells in one lymph node) multifocal invasive cancer. Stage IC, Left breast, Status post radical mastectomy, T2, N1, M0 tumor. Stage II, RIght breast. Both tumors are estrogen receptor positive.  Progesterone receptor positive.  HER-2 receptor negative by FISH. Negative for BRCA mutation.  2. Started on Adriamycin and Cytoxan on March 01, 2014. Last cycle of Cytoxan and Adriamycin on May 01, 2014. Patient has finished last chemotherapy on September 9. (12 th dose was omitted because of neuropathy) 3.  Taking tamoxifen .  Patient had a poor tolerance to letrozole.(October, 2015) 4.  Patient is taking letrozole  since November of 2015 5.  May, 2016 Letrozole has been put on hold because of bony pains 6.  Started on Aromasin  from July of 2016    History of bilateral breast cancer (Resolved)  11/29/2013 Initial Diagnosis   Breast cancer bilateral, multifocal ER/PR pos, Her 2 neg,.     ALLERGIES:  has no known allergies.  MEDICATIONS:   Current Outpatient Medications  Medication Sig Dispense Refill   acetaminophen  (TYLENOL ) 500 MG tablet Take 1,000 mg by mouth 2 (two) times daily as needed for moderate pain or headache.     baclofen  (LIORESAL ) 10 MG tablet TAKE 1 TABLET BY MOUTH THREE TIMES A DAY AS NEEDED FOR MUSCLE SPASMS 270 tablet 1   calcium  carbonate (OSCAL) 1500 (600 Ca) MG TABS tablet Take by mouth 2 (two) times daily with a meal.     dapagliflozin propanediol (FARXIGA) 10 MG TABS tablet Take 10 mg by mouth daily.     diclofenac  Sodium (VOLTAREN ) 1 % GEL Apply 2 g topically 3 (three) times daily as needed. 100 g 2   ezetimibe  (ZETIA ) 10 MG tablet Take 1 tablet (10 mg total) by mouth daily. 90 tablet 3   fluticasone  (FLONASE ) 50 MCG/ACT nasal spray PLACE 2 SPRAYS INTO BOTH NOSTRILS AT BEDTIME. 48 g 1   fulvestrant  (FASLODEX ) 250 MG/5ML injection Inject 500 mg into the muscle every 30 (thirty) days. One injection each buttock over 1-2 minutes. Warm prior to use.     Krill Oil 1000 MG CAPS Take 1,000 mg by mouth daily.     lisinopril  (ZESTRIL ) 5 MG tablet TAKE 1 TABLET (5 MG TOTAL) BY MOUTH DAILY. 90 tablet 0   loperamide  (IMODIUM ) 2 MG capsule TAKE 2 TABLETS (4 MG TOTAL) BY MOUTH 4 (FOUR) TIMES DAILY AS NEEDED FOR DIARRHEA OR LOOSE STOOLS. 30 capsule 1   metFORMIN  (GLUCOPHAGE ) 500 MG tablet TAKE 1 TABLET BY MOUTH 2 TIMES DAILY WITH A MEAL. 180 tablet 1   metoprolol  tartrate (LOPRESSOR ) 25 MG tablet TAKE 1/2 TABLET TWICE A DAY BY MOUTH 90 tablet 3   omeprazole  (PRILOSEC) 40 MG capsule TAKE 1 CAPSULE BY MOUTH TWICE A DAY 180 capsule 1   ondansetron  (ZOFRAN ) 8 MG tablet Take 1 tablet (8 mg total) by mouth every 8 (eight) hours as needed for nausea or vomiting. 20 tablet 0   prochlorperazine  (COMPAZINE ) 10 MG tablet Take 1 tablet (10 mg total) by mouth every 6 (six) hours as needed for nausea or vomiting. 20 tablet 1   ribociclib  succ (KISQALI  400MG  DAILY DOSE) 200 MG Therapy Pack Take 2 tablets (400 mg total) by mouth daily.  Take for 21 days on, 7 days off, repeat every 28 days. 42 tablet 1   sodium chloride  (OCEAN) 0.65 % SOLN nasal spray Place 1 spray into both nostrils as needed for congestion.     traZODone  (DESYREL ) 50 MG tablet TAKE 1 TABLET (50 MG TOTAL) BY MOUTH AT BEDTIME AS NEEDED. FOR SLEEP 90 tablet 1   venlafaxine  XR (EFFEXOR -XR) 75 MG 24 hr capsule Take 1 capsule (75 mg total) by mouth daily with breakfast. 90 capsule 3   vitamin B-12 (CYANOCOBALAMIN ) 1000 MCG tablet Take 2,500 mcg by mouth every Monday, Wednesday, and Friday.     Vitamin D , Cholecalciferol , 50 MCG (2000 UT) CAPS Take 2,000 Units by mouth every Monday, Wednesday, and Friday.     No current facility-administered medications for this visit.    VITAL SIGNS: BP 133/65   Pulse 72   Temp ROLLEN)  96.3 F (35.7 C) (Tympanic)   Resp 20   Ht 5' 5 (1.651 m)   Wt 93.4 kg (206 lb)   LMP 12/22/1999 (Approximate)   BMI 34.28 kg/m  Filed Weights   07/20/24 0957  Weight: 93.4 kg (206 lb)       Estimated body mass index is 34.28 kg/m as calculated from the following:   Height as of this encounter: 5' 5 (1.651 m).   Weight as of this encounter: 93.4 kg (206 lb).  LABS: CBC:    Component Value Date/Time   WBC 4.5 07/20/2024 0941   WBC 4.0 03/30/2024 0950   HGB 9.8 (L) 07/20/2024 0941   HGB 15.5 07/13/2017 0937   HCT 29.6 (L) 07/20/2024 0941   HCT 46.7 (H) 07/13/2017 0937   PLT 186 07/20/2024 0941   PLT 211 07/13/2017 0937   MCV 108.4 (H) 07/20/2024 0941   MCV 94 07/13/2017 0937   MCV 93 03/11/2015 1356   NEUTROABS 2.3 07/20/2024 0941   NEUTROABS 5.4 07/13/2017 0937   NEUTROABS 5.6 03/11/2015 1356   LYMPHSABS 1.2 07/20/2024 0941   LYMPHSABS 2.1 07/13/2017 0937   LYMPHSABS 2.3 03/11/2015 1356   MONOABS 0.7 07/20/2024 0941   MONOABS 1.0 (H) 03/11/2015 1356   EOSABS 0.2 07/20/2024 0941   EOSABS 0.2 07/13/2017 0937   EOSABS 0.3 03/11/2015 1356   BASOSABS 0.1 07/20/2024 0941   BASOSABS 0.0 07/13/2017 0937   BASOSABS 0.1  03/11/2015 1356   Comprehensive Metabolic Panel:    Component Value Date/Time   NA 140 07/20/2024 0942   NA 142 07/14/2019 1516   NA 139 03/11/2015 1356   K 4.8 07/20/2024 0942   K 3.4 (L) 03/11/2015 1356   CL 106 07/20/2024 0942   CL 103 03/11/2015 1356   CO2 24 07/20/2024 0942   CO2 28 03/11/2015 1356   BUN 24 (H) 07/20/2024 0942   BUN 12 07/14/2019 1516   BUN 13 03/11/2015 1356   CREATININE 1.64 (H) 07/20/2024 0942   CREATININE 0.85 10/20/2021 0838   GLUCOSE 130 (H) 07/20/2024 0942   GLUCOSE 118 (H) 03/11/2015 1356   CALCIUM  9.2 07/20/2024 0942   CALCIUM  9.2 03/11/2015 1356   AST 44 (H) 07/20/2024 0942   ALT 35 07/20/2024 0942   ALT 32 03/11/2015 1356   ALKPHOS 79 07/20/2024 0942   ALKPHOS 68 03/11/2015 1356   BILITOT 0.6 07/20/2024 0942   PROT 7.0 07/20/2024 0942   PROT 6.9 07/14/2019 1516   PROT 6.9 03/11/2015 1356   ALBUMIN 3.5 07/20/2024 0942   ALBUMIN 4.5 07/14/2019 1516   ALBUMIN 4.2 03/11/2015 1356     Present during today's visit: patient only   Assessment and Plan: CBC/CMP reviewed, labs unchanged (note: ca27.29 is still pending)  Continue ribociclib  400mg  21on/7off  Renal function stable Patient will have repeat PET prior to next office visit Patient to receive fulvestrant  today  Overall patient is tolerating therapy well since her dose decrease to 400mg  on 04/27/24  Oral Chemotherapy Adherence: No missed doses reported in the last cycle There are no patient barriers to medication adherence identified.   New medications: None reported  Medication Access Issues: No issues, receives Health and safety inspector and fill at [employer] (Specialty).   Patient expressed understanding and was in agreement with this plan. She also understands that She can call clinic at any time with any questions, concerns, or complaints.   Follow-up plan: RTC in 4 weeks.   Thank you for allowing me to participate  in the care of this very pleasant patient.   Time Total: 15  minutes  Visit consisted of counseling and education on dealing with issues of symptom management in the setting of serious and potentially life-threatening illness.Greater than 50%  of this time was spent counseling and coordinating care related to the above assessment and plan.  Signed by: Haely Leyland N. Sareena Odeh, PharmD, NEILA, CPP Hematology/Oncology Clinical Pharmacist Practitioner /[facility] [phone]  07/20/2024 12:32 PM

## 2024-07-20 NOTE — Patient Instructions (Signed)
 Ms. Vint , Thank you for taking time out of your busy schedule to complete your Annual Wellness Visit with me. I enjoyed our conversation and look forward to speaking with you again next year. I, as well as your care team,  appreciate your ongoing commitment to your health goals. Please review the following plan we discussed and let me know if I can assist you in the future. Your Game plan/ To Do List    Referrals: If you haven't heard from the office you've been referred to, please reach out to them at the phone provided.   Follow up Visits: We will see or speak with you next year for your Next Medicare AWV with our clinical staff 07/27/25 @ 8:10a  Have you seen your provider in the last 6 months (3 months if uncontrolled diabetes)? Next appointment with provider  11/13/24 @ 9:20a   Clinician Recommendations:  Aim for 30 minutes of exercise or brisk walking, 6-8 glasses of water, and 5 servings of fruits and vegetables each day.       This is a list of the screenings recommended for you:  Health Maintenance  Topic Date Due   COVID-19 Vaccine (1) Never done   Zoster (Shingles) Vaccine (1 of 2) Never done   Yearly kidney health urinalysis for diabetes  06/20/2024   Flu Shot  06/23/2024   Pneumococcal Vaccine for age over 40 (1 of 2 - PCV) 11/01/2024*   Complete foot exam   11/01/2024   Hemoglobin A1C  11/01/2024   Eye exam for diabetics  11/25/2024   Screening for Lung Cancer  02/20/2025   Yearly kidney function blood test for diabetes  06/22/2025   Medicare Annual Wellness Visit  07/20/2025   DTaP/Tdap/Td vaccine (2 - Td or Tdap) 07/21/2025   Colon Cancer Screening  12/23/2026   DEXA scan (bone density measurement)  Completed   Hepatitis C Screening  Completed   HPV Vaccine  Aged Out   Meningitis B Vaccine  Aged Out  *Topic was postponed. The date shown is not the original due date.    Advanced directives: (In Chart) A copy of your advanced directives are scanned into your  chart should your provider ever need it. Advance Care Planning is important because it:  [x]  Makes sure you receive the medical care that is consistent with your values, goals, and preferences  [x]  It provides guidance to your family and loved ones and reduces their decisional burden about whether or not they are making the right decisions based on your wishes.  Follow the link provided in your after visit summary or read over the paperwork we have mailed to you to help you started getting your Advance Directives in place. If you need assistance in completing these, please reach out to us  so that we can help you!  See attachments for Preventive Care and Fall Prevention Tips.

## 2024-07-21 LAB — CANCER ANTIGEN 27.29: CA 27.29: 64.4 U/mL — ABNORMAL HIGH (ref 0.0–38.6)

## 2024-07-26 ENCOUNTER — Other Ambulatory Visit: Payer: Self-pay

## 2024-07-26 NOTE — Progress Notes (Signed)
 Specialty Pharmacy Ongoing Clinical Assessment Note  Cassidy Norris is a 73 y.o. female who is being followed by the specialty pharmacy service for RxSp Oncology   Patient's specialty medication(s) reviewed today: Ribociclib  Succinate (KISQALI  400mg  Daily Dose)   Missed doses in the last 4 weeks: 0   Patient/Caregiver did not have any additional questions or concerns.   Therapeutic benefit summary: Patient is achieving benefit   Adverse events/side effects summary: No adverse events/side effects   Patient's therapy is appropriate to: Continue    Goals Addressed             This Visit's Progress    Slow Disease Progression       Patient is on track. Patient will maintain adherence.  CA 27.29 64.4 as of 07/20/24. PET scan scheduled for 08/02/24 to assess status.         Follow up: 3 months  Cassidy Norris M Cassidy Norris Specialty Pharmacist

## 2024-07-27 NOTE — Telephone Encounter (Signed)
 Signed  forms faxed to [phone].

## 2024-08-02 ENCOUNTER — Ambulatory Visit
Admission: RE | Admit: 2024-08-02 | Discharge: 2024-08-02 | Disposition: A | Discharge status: Home / Self Care | Source: Ambulatory Visit | Attending: Oncology | Admitting: Oncology

## 2024-08-02 DIAGNOSIS — I700 Atherosclerosis of aorta: Secondary | ICD-10-CM | POA: Insufficient documentation

## 2024-08-02 DIAGNOSIS — I728 Aneurysm of other specified arteries: Secondary | ICD-10-CM | POA: Insufficient documentation

## 2024-08-02 DIAGNOSIS — C50919 Malignant neoplasm of unspecified site of unspecified female breast: Secondary | ICD-10-CM | POA: Diagnosis not present

## 2024-08-02 DIAGNOSIS — K760 Fatty (change of) liver, not elsewhere classified: Secondary | ICD-10-CM | POA: Insufficient documentation

## 2024-08-02 LAB — GLUCOSE, CAPILLARY: Glucose-Capillary: 134 mg/dL — ABNORMAL HIGH (ref 70–99)

## 2024-08-02 MED ORDER — FLUDEOXYGLUCOSE F - 18 (FDG) INJECTION
10.7000 | Freq: Once | INTRAVENOUS | Status: AC | PRN
Start: 2024-08-02 — End: 2024-08-02
  Administered 2024-08-02: 11.56 via INTRAVENOUS

## 2024-08-03 ENCOUNTER — Encounter (INDEPENDENT_AMBULATORY_CARE_PROVIDER_SITE_OTHER): Payer: Self-pay

## 2024-08-04 ENCOUNTER — Other Ambulatory Visit: Payer: Self-pay

## 2024-08-04 ENCOUNTER — Other Ambulatory Visit (HOSPITAL_COMMUNITY): Payer: Self-pay

## 2024-08-04 NOTE — Progress Notes (Signed)
 Specialty Pharmacy Refill Coordination Note  MyChart Questionnaire Submission  Cassidy Norris is a 73 y.o. female contacted today regarding refills of specialty medication(s) Kisqali .  Doses on hand: (Patient-Rptd) 6 days   Patient requested: (Patient-Rptd) Delivery   Delivery date: 08/08/24  Verified address: [address] [place] [address]  Medication will be filled on 08/07/24. Next cycle approx 08/17/24.

## 2024-08-07 ENCOUNTER — Other Ambulatory Visit: Payer: Self-pay

## 2024-08-13 ENCOUNTER — Other Ambulatory Visit: Payer: Self-pay | Admitting: Oncology

## 2024-08-14 ENCOUNTER — Encounter: Payer: Self-pay | Admitting: Oncology

## 2024-08-17 ENCOUNTER — Encounter: Payer: Self-pay | Admitting: Oncology

## 2024-08-17 ENCOUNTER — Other Ambulatory Visit: Payer: Self-pay | Admitting: *Deleted

## 2024-08-17 ENCOUNTER — Inpatient Hospital Stay

## 2024-08-17 ENCOUNTER — Inpatient Hospital Stay: Attending: Oncology

## 2024-08-17 ENCOUNTER — Inpatient Hospital Stay (HOSPITAL_BASED_OUTPATIENT_CLINIC_OR_DEPARTMENT_OTHER): Admitting: Oncology

## 2024-08-17 VITALS — BP 109/74 | HR 95 | Temp 97.8°F | Resp 18 | Wt 205.0 lb

## 2024-08-17 DIAGNOSIS — Z8719 Personal history of other diseases of the digestive system: Secondary | ICD-10-CM | POA: Diagnosis not present

## 2024-08-17 DIAGNOSIS — C778 Secondary and unspecified malignant neoplasm of lymph nodes of multiple regions: Secondary | ICD-10-CM | POA: Insufficient documentation

## 2024-08-17 DIAGNOSIS — C50919 Malignant neoplasm of unspecified site of unspecified female breast: Secondary | ICD-10-CM

## 2024-08-17 DIAGNOSIS — Z801 Family history of malignant neoplasm of trachea, bronchus and lung: Secondary | ICD-10-CM | POA: Diagnosis not present

## 2024-08-17 DIAGNOSIS — Z1721 Progesterone receptor positive status: Secondary | ICD-10-CM | POA: Diagnosis not present

## 2024-08-17 DIAGNOSIS — Z825 Family history of asthma and other chronic lower respiratory diseases: Secondary | ICD-10-CM | POA: Diagnosis not present

## 2024-08-17 DIAGNOSIS — Z9049 Acquired absence of other specified parts of digestive tract: Secondary | ICD-10-CM | POA: Diagnosis not present

## 2024-08-17 DIAGNOSIS — K219 Gastro-esophageal reflux disease without esophagitis: Secondary | ICD-10-CM | POA: Diagnosis not present

## 2024-08-17 DIAGNOSIS — Z85828 Personal history of other malignant neoplasm of skin: Secondary | ICD-10-CM | POA: Insufficient documentation

## 2024-08-17 DIAGNOSIS — Z6834 Body mass index (BMI) 34.0-34.9, adult: Secondary | ICD-10-CM | POA: Insufficient documentation

## 2024-08-17 DIAGNOSIS — Z8249 Family history of ischemic heart disease and other diseases of the circulatory system: Secondary | ICD-10-CM | POA: Diagnosis not present

## 2024-08-17 DIAGNOSIS — Z170 Estrogen receptor positive status [ER+]: Secondary | ICD-10-CM | POA: Diagnosis not present

## 2024-08-17 DIAGNOSIS — Z833 Family history of diabetes mellitus: Secondary | ICD-10-CM | POA: Insufficient documentation

## 2024-08-17 DIAGNOSIS — C50911 Malignant neoplasm of unspecified site of right female breast: Secondary | ICD-10-CM | POA: Insufficient documentation

## 2024-08-17 DIAGNOSIS — I129 Hypertensive chronic kidney disease with stage 1 through stage 4 chronic kidney disease, or unspecified chronic kidney disease: Secondary | ICD-10-CM | POA: Diagnosis not present

## 2024-08-17 DIAGNOSIS — Z803 Family history of malignant neoplasm of breast: Secondary | ICD-10-CM | POA: Diagnosis not present

## 2024-08-17 DIAGNOSIS — Z79811 Long term (current) use of aromatase inhibitors: Secondary | ICD-10-CM | POA: Diagnosis not present

## 2024-08-17 DIAGNOSIS — C50912 Malignant neoplasm of unspecified site of left female breast: Secondary | ICD-10-CM | POA: Insufficient documentation

## 2024-08-17 DIAGNOSIS — N184 Chronic kidney disease, stage 4 (severe): Secondary | ICD-10-CM | POA: Insufficient documentation

## 2024-08-17 DIAGNOSIS — Z800 Family history of malignant neoplasm of digestive organs: Secondary | ICD-10-CM | POA: Diagnosis not present

## 2024-08-17 DIAGNOSIS — Z1732 Human epidermal growth factor receptor 2 negative status: Secondary | ICD-10-CM | POA: Insufficient documentation

## 2024-08-17 DIAGNOSIS — Z9013 Acquired absence of bilateral breasts and nipples: Secondary | ICD-10-CM | POA: Insufficient documentation

## 2024-08-17 DIAGNOSIS — Z87891 Personal history of nicotine dependence: Secondary | ICD-10-CM | POA: Insufficient documentation

## 2024-08-17 DIAGNOSIS — Z79899 Other long term (current) drug therapy: Secondary | ICD-10-CM | POA: Insufficient documentation

## 2024-08-17 DIAGNOSIS — E1122 Type 2 diabetes mellitus with diabetic chronic kidney disease: Secondary | ICD-10-CM | POA: Diagnosis not present

## 2024-08-17 DIAGNOSIS — Z5111 Encounter for antineoplastic chemotherapy: Secondary | ICD-10-CM | POA: Diagnosis not present

## 2024-08-17 LAB — CMP (CANCER CENTER ONLY)
ALT: 32 U/L (ref 0–44)
AST: 46 U/L — ABNORMAL HIGH (ref 15–41)
Albumin: 3.9 g/dL (ref 3.5–5.0)
Alkaline Phosphatase: 85 U/L (ref 38–126)
Anion gap: 12 (ref 5–15)
BUN: 20 mg/dL (ref 8–23)
CO2: 24 mmol/L (ref 22–32)
Calcium: 9 mg/dL (ref 8.9–10.3)
Chloride: 103 mmol/L (ref 98–111)
Creatinine: 1.79 mg/dL — ABNORMAL HIGH (ref 0.44–1.00)
GFR, Estimated: 30 mL/min — ABNORMAL LOW (ref >60)
Glucose, Bld: 127 mg/dL — ABNORMAL HIGH (ref 70–99)
Potassium: 4.8 mmol/L (ref 3.5–5.1)
Sodium: 139 mmol/L (ref 135–145)
Total Bilirubin: 0.6 mg/dL (ref 0.0–1.2)
Total Protein: 7.4 g/dL (ref 6.5–8.1)

## 2024-08-17 LAB — CBC WITH DIFFERENTIAL (CANCER CENTER ONLY)
Abs Immature Granulocytes: 0.01 K/uL (ref 0.00–0.07)
Basophils Absolute: 0.1 K/uL (ref 0.0–0.1)
Basophils Relative: 2 %
Eosinophils Absolute: 0.1 K/uL (ref 0.0–0.5)
Eosinophils Relative: 3 %
HCT: 31.8 % — ABNORMAL LOW (ref 36.0–46.0)
Hemoglobin: 10.3 g/dL — ABNORMAL LOW (ref 12.0–15.0)
Immature Granulocytes: 0 %
Lymphocytes Relative: 27 %
Lymphs Abs: 1.2 K/uL (ref 0.7–4.0)
MCH: 35 pg — ABNORMAL HIGH (ref 26.0–34.0)
MCHC: 32.4 g/dL (ref 30.0–36.0)
MCV: 108.2 fL — ABNORMAL HIGH (ref 80.0–100.0)
Monocytes Absolute: 0.7 K/uL (ref 0.1–1.0)
Monocytes Relative: 16 %
Neutro Abs: 2.3 K/uL (ref 1.7–7.7)
Neutrophils Relative %: 52 %
Platelet Count: 194 K/uL (ref 150–400)
RBC: 2.94 MIL/uL — ABNORMAL LOW (ref 3.87–5.11)
RDW: 14.5 % (ref 11.5–15.5)
WBC Count: 4.4 K/uL (ref 4.0–10.5)
nRBC: 0 % (ref 0.0–0.2)

## 2024-08-17 MED ORDER — FULVESTRANT 250 MG/5ML IM SOSY
500.0000 mg | PREFILLED_SYRINGE | Freq: Once | INTRAMUSCULAR | Status: AC
  Administered 2024-08-17: 500 mg via INTRAMUSCULAR
  Filled 2024-08-17: qty 10

## 2024-08-17 NOTE — Progress Notes (Signed)
 [facility]  [phone]  [phone]     Cassidy Norris DOB: [birth year]  MR#: [id number]  RDW#:[id number]  Patient Care Team: Edman Marsa PARAS, DO as PCP - General (Family Medicine) Darron Deatrice LABOR, MD as PCP - Cardiology (Cardiology) Skeet Juliene SAUNDERS, DO as Consulting Physician (Neurology) Jacobo Evalene PARAS, MD as Consulting Physician (Oncology) Maurie Rayfield BIRCH, RN as Oncology Nurse Navigator Mevelyn BIRCH Bathe, OD (Optometry)  CHIEF COMPLAINT: Recurrent, metastatic ER/PR, HER2 negative invasive carcinoma of the breast.    INTERVAL HISTORY: Patient returns to clinic today for further evaluation, discussion of her PET scan results, and continuation of dose reduced Kisqali  and fulvestrant .  She currently feels well and is asymptomatic.  Since dose reducing Kisqali  she no longer complains of any nausea or fatigue.  She denies any pain.  She has no new neurologic complaints.  She denies any recent fevers or illnesses.  She has a fair appetite and denies weight loss.  She has no chest pain, shortness of breath, cough, or hemoptysis.  She denies any nausea, vomiting, constipation, or diarrhea.  She has no urinary complaints.  Patient offers no specific complaints today.  REVIEW OF SYSTEMS:   Review of Systems  Constitutional: Negative.  Negative for fever, malaise/fatigue and weight loss.  Respiratory: Negative.  Negative for cough, hemoptysis and shortness of breath.   Cardiovascular: Negative.  Negative for chest pain and leg swelling.  Gastrointestinal: Negative.  Negative for abdominal pain, nausea and vomiting.  Genitourinary: Negative.  Negative for dysuria and flank pain.  Musculoskeletal:  Negative for joint pain and myalgias.  Skin: Negative.  Negative for rash.  Neurological: Negative.  Negative for dizziness, sensory change, focal weakness, weakness and headaches.  Psychiatric/Behavioral: Negative.  The patient is not nervous/anxious.      As per HPI. Otherwise, a complete review of systems is negative.  ONCOLOGY HISTORY: Oncology History Overview Note  1. Bilateral carcinoma of breast, February, 2015. Right breast status post radical mastectomy. T1 C. N0M0 (isolated tumor cells in one lymph node) multifocal invasive cancer. Stage IC, Left breast, Status post radical mastectomy, T2, N1, M0 tumor. Stage II, RIght breast. Both tumors are estrogen receptor positive.  Progesterone receptor positive.  HER-2 receptor negative by FISH. Negative for BRCA mutation.  2. Started on Adriamycin and Cytoxan on March 01, 2014. Last cycle of Cytoxan and Adriamycin on May 01, 2014. Patient has finished last chemotherapy on September 9. (12 th dose was omitted because of neuropathy) 3.  Taking tamoxifen .  Patient had a poor tolerance to letrozole.(October, 2015) 4.  Patient is taking letrozole since November of 2015 5.  May, 2016 Letrozole has been put on hold because of bony pains 6.  Started on Aromasin  from July of 2016    History of bilateral breast cancer (Resolved)  11/29/2013 Initial Diagnosis   Breast cancer bilateral, multifocal ER/PR pos, Her 2 neg,.     PAST MEDICAL HISTORY: Past Medical History:  Diagnosis Date   Actinic keratosis    Anemia    Arthritis not really diagnosis but have pain   B12 deficiency 07/23/2016   Will check levels to determine if injections are needed again. Await results. Recent CBC at Onc WNL   Blood transfusion without reported diagnosis 1983 had a miscarriage/dnc   Cancer (HCC) 12/30/2013   Multifocal disease: pT1c,m, N0(isolated tumor cells) Er/ PR positive, Her 2 negative.   Cancer (HCC) 01/09/2014   Invasive lobular carcinoma, 2.2 cm, T2,N1 ER/PR positive, HER-2/neu  negative   Cataract 2014?   Centrilobular emphysema (HCC) 10/27/2021   CKD (chronic kidney disease), stage IV (HCC) 07/13/2023   Complication of anesthesia 03/06/2022   Prolonged metabolism of Rocuronium  during case.    Diabetes mellitus without complication (HCC)    Diffuse cystic mastopathy    Erosive esophagitis    Essential hypertension, benign 09/24/2017   Family history of malignant neoplasm of gastrointestinal tract 2012   Fractured elbow 08/28/2014   Gastroesophageal reflux disease    Hx of metabolic acidosis with increased anion gap    Increased heart rate    Multiple sclerosis    Neuromuscular disorder (HCC)    neuropathy in bil feet - left is worse.   Obesity, unspecified    Peripheral neuropathy    Personal history of tobacco use, presenting hazards to health    Restless leg syndrome    Sleep apnea    uses CPAP   Special screening for malignant neoplasms, colon    Squamous cell carcinoma of skin 08/24/2022   SCCIS - scalp, ED&C   Squamous cell carcinoma of skin 07/20/2023   SCC IS right of midline ant scalp post, tx with ED&C   Squamous cell carcinoma of skin 07/20/2023   SCC IS right of midline ant scalp ant, tx with ED&C    PAST SURGICAL HISTORY: Past Surgical History:  Procedure Laterality Date   BREAST BIOPSY Right 1973,2001   BREAST BIOPSY Right 11/07/2013   INVASIVE MAMMARY CARCINOMA , ER/PR positive Her2 negative   BREAST BIOPSY Left 11/27/2013   invasive lobular and DCIS   BREAST SURGERY Bilateral 01/09/2014   mastectomy   cataract surgery Left 08/13/2022   CHOLECYSTECTOMY     COLONOSCOPY  2010   Dr. Dellie   COLONOSCOPY WITH PROPOFOL  N/A 06/11/2015   Procedure: COLONOSCOPY WITH PROPOFOL ;  Surgeon: Louanne KANDICE Dellie, MD;  Location: [facility] ENDOSCOPY;  Service: Endoscopy;  Laterality: N/A;   COLONOSCOPY WITH PROPOFOL  N/A 12/23/2021   Procedure: COLONOSCOPY WITH PROPOFOL ;  Surgeon: Unk Corinn Skiff, MD;  Location: [facility] ENDOSCOPY;  Service: Gastroenterology;  Laterality: N/A;   DILATATION & CURETTAGE/HYSTEROSCOPY WITH MYOSURE N/A 07/12/2018   Procedure: DILATATION & CURETTAGE/HYSTEROSCOPY WITH MYOSURE WITH ENDOMETRIAL POLYPECTOMY;  Surgeon: Leonce Garnette BIRCH, MD;   Location: [facility] ORS;  Service: Gynecology;  Laterality: N/A;   DILATION AND CURETTAGE OF UTERUS  1983   ESOPHAGOGASTRODUODENOSCOPY N/A 12/23/2021   Procedure: ESOPHAGOGASTRODUODENOSCOPY (EGD);  Surgeon: Unk Corinn Skiff, MD;  Location: [facility] ENDOSCOPY;  Service: Gastroenterology;  Laterality: N/A;   JOINT REPLACEMENT Left 11/23/2015   right  2023   KNEE ARTHROPLASTY Right 03/06/2022   Procedure: COMPUTER ASSISTED TOTAL KNEE ARTHROPLASTY;  Surgeon: Mardee Lynwood SQUIBB, MD;  Location: [facility] ORS;  Service: Orthopedics;  Laterality: Right;   LAPAROSCOPIC BILATERAL SALPINGO OOPHERECTOMY N/A 08/04/2023   Procedure: LAPAROSCOPIC BILATERAL SALPINGO OOPHORECTOMY;  Surgeon: Mancil Barter, MD;  Location: [facility] ORS;  Service: Gynecology;  Laterality: N/A;   POLYPECTOMY  1989   PORTA CATH REMOVAL     PORTACATH PLACEMENT  2015   SKIN BIOPSY     on scalp   SPINE SURGERY  09/14/2014   Ruptured disk L4- L5   TOTAL KNEE ARTHROPLASTY Left 11/22/2015   Procedure: TOTAL KNEE ARTHROPLASTY;  Surgeon: Toribio Silos, MD;  Location: [facility] OR;  Service: Orthopedics;  Laterality: Left;   TUBAL LIGATION     WISDOM TOOTH EXTRACTION  2006    FAMILY HISTORY Family History  Problem Relation Age of Onset   Lung cancer Mother 95  COPD Mother    Colon cancer Father 67   Stomach cancer Father 48   Hypertension Sister    Breast cancer Maternal Aunt        dx over 37   Breast cancer Paternal Aunt        dx 39s   Breast cancer Paternal Aunt        dx 85s   Rectal cancer Paternal Uncle        dx 66s   Rectal cancer Paternal Uncle        dx 35s   Diabetes Maternal Grandmother    Pancreatic cancer Paternal Grandfather    Rectal cancer Cousin        dx 21s-60s   Rectal cancer Cousin        dx >50    GYNECOLOGIC HISTORY:  Patient's last menstrual period was 12/22/1999 (approximate).     ADVANCED DIRECTIVES:    HEALTH MAINTENANCE: Social History   Tobacco Use   Smoking status: Former    Current packs/day:  0.00    Average packs/day: 1 pack/day for 30.0 years (30.0 ttl pk-yrs)    Types: Cigarettes    Start date: 12/12/1983    Quit date: 12/11/2013    Years since quitting: 10.6   Smokeless tobacco: Former  Building services engineer status: Never Used  Substance Use Topics   Alcohol use: Not Currently   Drug use: No     Colonoscopy:  PAP:  Bone density:  Mammogram:  No Known Allergies  Current Outpatient Medications  Medication Sig Dispense Refill   acetaminophen  (TYLENOL ) 500 MG tablet Take 1,000 mg by mouth 2 (two) times daily as needed for moderate pain or headache.     baclofen  (LIORESAL ) 10 MG tablet TAKE 1 TABLET BY MOUTH THREE TIMES A DAY AS NEEDED FOR MUSCLE SPASMS 270 tablet 1   calcium  carbonate (OSCAL) 1500 (600 Ca) MG TABS tablet Take by mouth 2 (two) times daily with a meal.     dapagliflozin propanediol (FARXIGA) 10 MG TABS tablet Take 10 mg by mouth daily.     diclofenac  Sodium (VOLTAREN ) 1 % GEL Apply 2 g topically 3 (three) times daily as needed. 100 g 2   ezetimibe  (ZETIA ) 10 MG tablet Take 1 tablet (10 mg total) by mouth daily. 90 tablet 3   fluticasone  (FLONASE ) 50 MCG/ACT nasal spray PLACE 2 SPRAYS INTO BOTH NOSTRILS AT BEDTIME. 48 g 1   fulvestrant  (FASLODEX ) 250 MG/5ML injection Inject 500 mg into the muscle every 30 (thirty) days. One injection each buttock over 1-2 minutes. Warm prior to use.     Krill Oil 1000 MG CAPS Take 1,000 mg by mouth daily.     lisinopril  (ZESTRIL ) 5 MG tablet TAKE 1 TABLET (5 MG TOTAL) BY MOUTH DAILY. 90 tablet 0   loperamide  (IMODIUM ) 2 MG capsule TAKE 2 TABLETS (4 MG TOTAL) BY MOUTH 4 (FOUR) TIMES DAILY AS NEEDED FOR DIARRHEA OR LOOSE STOOLS. 30 capsule 1   metFORMIN  (GLUCOPHAGE ) 500 MG tablet TAKE 1 TABLET BY MOUTH 2 TIMES DAILY WITH A MEAL. 180 tablet 1   metoprolol  tartrate (LOPRESSOR ) 25 MG tablet TAKE 1/2 TABLET TWICE A DAY BY MOUTH 90 tablet 3   omeprazole  (PRILOSEC) 40 MG capsule TAKE 1 CAPSULE BY MOUTH TWICE A DAY 180 capsule 1    ondansetron  (ZOFRAN ) 8 MG tablet Take 1 tablet (8 mg total) by mouth every 8 (eight) hours as needed for nausea or vomiting. 20 tablet 0  prochlorperazine  (COMPAZINE ) 10 MG tablet Take 1 tablet (10 mg total) by mouth every 6 (six) hours as needed for nausea or vomiting. 20 tablet 1   ribociclib  succ (KISQALI  400MG  DAILY DOSE) 200 MG Therapy Pack Take 2 tablets (400 mg total) by mouth daily. Take for 21 days on, 7 days off, repeat every 28 days. 42 tablet 1   sodium chloride  (OCEAN) 0.65 % SOLN nasal spray Place 1 spray into both nostrils as needed for congestion.     traZODone  (DESYREL ) 50 MG tablet TAKE 1 TABLET (50 MG TOTAL) BY MOUTH AT BEDTIME AS NEEDED. FOR SLEEP 90 tablet 1   venlafaxine  XR (EFFEXOR -XR) 75 MG 24 hr capsule Take 1 capsule (75 mg total) by mouth daily with breakfast. 90 capsule 3   vitamin B-12 (CYANOCOBALAMIN ) 1000 MCG tablet Take 2,500 mcg by mouth every Monday, Wednesday, and Friday.     Vitamin D , Cholecalciferol , 50 MCG (2000 UT) CAPS Take 2,000 Units by mouth every Monday, Wednesday, and Friday.     No current facility-administered medications for this visit.    OBJECTIVE: BP 109/74 (BP Location: Right Arm, Patient Position: Sitting, Cuff Size: Large)   Pulse 95   Temp 97.8 F (36.6 C) (Tympanic)   Resp 18   Wt 205 lb (93 kg)   LMP 12/22/1999 (Approximate)   SpO2 97%   BMI 34.11 kg/m    Body mass index is 34.11 kg/m.    ECOG FS:0 - Asymptomatic  General: Well-developed, well-nourished, no acute distress. Eyes: Pink conjunctiva, anicteric sclera. HEENT: Normocephalic, moist mucous membranes. Lungs: No audible wheezing or coughing. Heart: Regular rate and rhythm. Abdomen: Soft, nontender, no obvious distention. Musculoskeletal: No edema, cyanosis, or clubbing. Neuro: Alert, answering all questions appropriately. Cranial nerves grossly intact. Skin: No rashes or petechiae noted. Psych: Normal affect.  LAB RESULTS:  Appointment on 08/17/2024  Component  Date Value Ref Range Status   WBC Count 08/17/2024 4.4  4.0 - 10.5 K/uL Final   RBC 08/17/2024 2.94 (L)  3.87 - 5.11 MIL/uL Final   Hemoglobin 08/17/2024 10.3 (L)  12.0 - 15.0 g/dL Final   HCT 90/74/7974 31.8 (L)  36.0 - 46.0 % Final   MCV 08/17/2024 108.2 (H)  80.0 - 100.0 fL Final   MCH 08/17/2024 35.0 (H)  26.0 - 34.0 pg Final   MCHC 08/17/2024 32.4  30.0 - 36.0 g/dL Final   RDW 90/74/7974 14.5  11.5 - 15.5 % Final   Platelet Count 08/17/2024 194  150 - 400 K/uL Final   nRBC 08/17/2024 0.0  0.0 - 0.2 % Final   Neutrophils Relative % 08/17/2024 52  % Final   Neutro Abs 08/17/2024 2.3  1.7 - 7.7 K/uL Final   Lymphocytes Relative 08/17/2024 27  % Final   Lymphs Abs 08/17/2024 1.2  0.7 - 4.0 K/uL Final   Monocytes Relative 08/17/2024 16  % Final   Monocytes Absolute 08/17/2024 0.7  0.1 - 1.0 K/uL Final   Eosinophils Relative 08/17/2024 3  % Final   Eosinophils Absolute 08/17/2024 0.1  0.0 - 0.5 K/uL Final   Basophils Relative 08/17/2024 2  % Final   Basophils Absolute 08/17/2024 0.1  0.0 - 0.1 K/uL Final   Immature Granulocytes 08/17/2024 0  % Final   Abs Immature Granulocytes 08/17/2024 0.01  0.00 - 0.07 K/uL Final   Performed at [facility], [address] [place], [place] [address]   Sodium 08/17/2024 139  135 - 145 mmol/L Final   Potassium 08/17/2024  4.8  3.5 - 5.1 mmol/L Final   Chloride 08/17/2024 103  98 - 111 mmol/L Final   CO2 08/17/2024 24  22 - 32 mmol/L Final   Glucose, Bld 08/17/2024 127 (H)  70 - 99 mg/dL Final   Glucose reference range applies only to samples taken after fasting for at least 8 hours.   BUN 08/17/2024 20  8 - 23 mg/dL Final   Creatinine 90/74/7974 1.79 (H)  0.44 - 1.00 mg/dL Final   Calcium  08/17/2024 9.0  8.9 - 10.3 mg/dL Final   Total Protein 90/74/7974 7.4  6.5 - 8.1 g/dL Final   Albumin 90/74/7974 3.9  3.5 - 5.0 g/dL Final   AST 90/74/7974 46 (H)  15 - 41 U/L Final   ALT 08/17/2024 32  0 - 44 U/L Final   Alkaline Phosphatase 08/17/2024  85  38 - 126 U/L Final   Total Bilirubin 08/17/2024 0.6  0.0 - 1.2 mg/dL Final   GFR, Estimated 08/17/2024 30 (L)  >60 mL/min Final   Comment: (NOTE) Calculated using the CKD-EPI Creatinine Equation (2021)    Anion gap 08/17/2024 12  5 - 15 Final   Performed at [facility], [address] [place], [place] [address]    STUDIES: No results found.  ONCOLOGY HISTORY:  Bilateral adenocarcinoma of the breast. Patient is status post bilateral mastectomy in February 2015. She also received adjuvant chemotherapy with Adriamycin, Cytoxan, and Taxol completing treatment in approximately October 2015. She then initiated letrozole, but could not tolerate secondary to leg pain and was switched to Aromasin .  Patient then switched to tamoxifen  in October 2019 secondary to cost.  Patient subsequently discontinued tamoxifen  and did not complete the recommended 5 years of treatment.   ASSESSMENT: Recurrent, metastatic ER/PR, HER2 negative invasive carcinoma of the breast.   PLAN:    Recurrent, metastatic ER/PR, HER2 negative invasive carcinoma of the breast: See oncology history as above.  Biopsy confirmed recurrent disease.  PET scan results from December 26, 2021 reviewed independently with hypermetabolic left supraclavicular, subpectoral, and axillary lymphadenopathy consistent with nodal recurrence of patient's history of breast cancer.  No distant metastases were identified.  Her most recent PET scan on August 02, 2024 reviewed independently with no evidence of active malignancy.  Her initial CA 27-29 initially was 260.1.  Now seems to have plateaued between 60.9 and 98.5.  Her most recent result is 64.4.  Today's result is pending.  Of note, patient also had metastatic breast cancer in her surgical specimen from her oophorectomy.  Continue dose reduced Kisqali  400 mg 21 days with 7 days off.  Proceed with fulvestrant  today.  Return to clinic in 4 weeks for continuation of treatment and evaluation of  clinical pharmacy.  Patient will then return to clinic in 8 weeks for further evaluation. 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  Follow-up with gynecology oncology as did. Multiple sclerosis: Chronic and unchanged.  Continue evaluation and treatment per neurology.  Recent MRI of the brain did not reveal any new lesions. Renal insufficiency: Chronic and unchanged.  Patient's GFR is 30 today.  Dose reduce Kisqali  as above.  Monitor if GFR remains persistently less than 30. Anemia: Hemoglobin mildly improved to 10.3. Macrocytosis: Likely secondary to underlying treatments. Reflux: Patient does not complain of this today.  Continue omeprazole .   Nausea and vomiting: Patient does not complain of this today.  Continue ondansetron  as needed.  Patient expressed understanding and was in agreement with this plan.  She also understands that She can call clinic at any time with any questions, concerns, or complaints.   Breast cancer bilateral, multifocal ER/PR pos, Her 2 neg.   Staging form: Breast, AJCC 7th Edition     Clinical: Stage IIB (T2, N1, M0) - Signed by Vester Clayman, MD on 04/22/2015   Evalene JINNY Reusing, MD   08/17/2024 1:34 PM

## 2024-08-17 NOTE — Progress Notes (Signed)
 Patient had a PET scan on 08/02/2024. She is doing pretty good.

## 2024-08-18 DIAGNOSIS — J310 Chronic rhinitis: Secondary | ICD-10-CM | POA: Diagnosis not present

## 2024-08-18 DIAGNOSIS — G4733 Obstructive sleep apnea (adult) (pediatric): Secondary | ICD-10-CM | POA: Diagnosis not present

## 2024-08-24 ENCOUNTER — Other Ambulatory Visit: Payer: Self-pay | Admitting: Family Medicine

## 2024-08-24 DIAGNOSIS — E1169 Type 2 diabetes mellitus with other specified complication: Secondary | ICD-10-CM

## 2024-08-24 DIAGNOSIS — R809 Proteinuria, unspecified: Secondary | ICD-10-CM

## 2024-08-25 NOTE — Telephone Encounter (Signed)
 Requested Prescriptions  Refused Prescriptions Disp Refills   lisinopril  (ZESTRIL ) 5 MG tablet [Pharmacy Med Name: LISINOPRIL  5 MG TABLET] 90 tablet 0    Sig: TAKE 1 TABLET (5 MG TOTAL) BY MOUTH DAILY.     Cardiovascular:  ACE Inhibitors Failed - 08/25/2024  3:09 PM      Failed - Cr in normal range and within 180 days    Creatinine  Date Value Ref Range Status  08/17/2024 1.79 (H) 0.44 - 1.00 mg/dL Final   Creat  Date Value Ref Range Status  10/20/2021 0.85 0.50 - 1.05 mg/dL Final   Creatinine, Urine  Date Value Ref Range Status  06/21/2023 74  Final         Passed - K in normal range and within 180 days    Potassium  Date Value Ref Range Status  08/17/2024 4.8 3.5 - 5.1 mmol/L Final  03/11/2015 3.4 (L) mmol/L Final    Comment:    3.5-5.1 NOTE: New Reference Range  01/29/15          Passed - Patient is not pregnant      Passed - Last BP in normal range    BP Readings from Last 1 Encounters:  08/17/24 109/74         Passed - Valid encounter within last 6 months    Recent Outpatient Visits           3 months ago Type 2 diabetes mellitus with other specified complication, without long-term current use of insulin  [facility])   [place] [facility] Edman Marsa PARAS, DO   7 months ago Centrilobular emphysema [facility])   [place] [facility] Edman Marsa PARAS, DO       Future Appointments             In 1 week Hester Alm BROCKS, MD [place] [facility]

## 2024-08-30 ENCOUNTER — Other Ambulatory Visit: Payer: Self-pay | Admitting: Family Medicine

## 2024-08-30 ENCOUNTER — Other Ambulatory Visit: Payer: Self-pay | Admitting: Neurology

## 2024-08-30 ENCOUNTER — Other Ambulatory Visit: Payer: Self-pay | Admitting: Oncology

## 2024-08-30 DIAGNOSIS — E1169 Type 2 diabetes mellitus with other specified complication: Secondary | ICD-10-CM

## 2024-08-30 DIAGNOSIS — C50919 Malignant neoplasm of unspecified site of unspecified female breast: Secondary | ICD-10-CM

## 2024-08-30 DIAGNOSIS — R809 Proteinuria, unspecified: Secondary | ICD-10-CM

## 2024-09-01 NOTE — Telephone Encounter (Signed)
 Requested Prescriptions  Pending Prescriptions Disp Refills   lisinopril  (ZESTRIL ) 5 MG tablet [Pharmacy Med Name: LISINOPRIL  5 MG TABLET] 90 tablet 0    Sig: TAKE 1 TABLET (5 MG TOTAL) BY MOUTH DAILY.     Cardiovascular:  ACE Inhibitors Failed - 09/01/2024  2:06 PM      Failed - Cr in normal range and within 180 days    Creatinine  Date Value Ref Range Status  08/17/2024 1.79 (H) 0.44 - 1.00 mg/dL Final   Creat  Date Value Ref Range Status  10/20/2021 0.85 0.50 - 1.05 mg/dL Final   Creatinine, Urine  Date Value Ref Range Status  06/21/2023 74  Final         Passed - K in normal range and within 180 days    Potassium  Date Value Ref Range Status  08/17/2024 4.8 3.5 - 5.1 mmol/L Final  03/11/2015 3.4 (L) mmol/L Final    Comment:    3.5-5.1 NOTE: New Reference Range  01/29/15          Passed - Patient is not pregnant      Passed - Last BP in normal range    BP Readings from Last 1 Encounters:  08/17/24 109/74         Passed - Valid encounter within last 6 months    Recent Outpatient Visits           4 months ago Type 2 diabetes mellitus with other specified complication, without long-term current use of insulin  [facility])   [place] [facility] [place], Marsa PARAS, DO   8 months ago Centrilobular emphysema [facility])   [place] Edman Marsa PARAS, DO       Future Appointments             In 4 days Hester Alm BROCKS, MD [facility]            Refused Prescriptions Disp Refills   ezetimibe  (ZETIA ) 10 MG tablet [Pharmacy Med Name: EZETIMIBE  10 MG TABLET] 90 tablet 3    Sig: TAKE 1 TABLET BY MOUTH EVERY DAY     Cardiovascular:  Antilipid - Sterol Transport Inhibitors Failed - 09/01/2024  2:06 PM      Failed - AST in normal range and within 360 days    AST  Date Value Ref Range Status  08/17/2024 46 (H) 15 - 41 U/L Final         Failed - Lipid Panel in normal range within the last  12 months    Cholesterol  Date Value Ref Range Status  10/25/2023 252 (H) <200 mg/dL Final   Cholesterol Piccolo, Waived  Date Value Ref Range Status  01/12/2017 159 <200 mg/dL Final    Comment:                            Desirable                <200                         Borderline High      200- 239                         High                     >  239    LDL Cholesterol (Calc)  Date Value Ref Range Status  10/25/2023  mg/dL (calc) Final    Comment:    . LDL cholesterol not calculated. Triglyceride levels greater than 400 mg/dL invalidate calculated LDL results. . Reference range: <100 . Desirable range <100 mg/dL for primary prevention;   <70 mg/dL for patients with CHD or diabetic patients  with > or = 2 CHD risk factors. [name] LDL-C is now calculated using the Martin-Hopkins  calculation, which is a validated novel method providing  better accuracy than the Friedewald equation in the  estimation of LDL-C.  [name] et al. [name]. 7986;689(80): 2061-2068  (http://education.[employer].com/faq/FAQ164)    Direct LDL  Date Value Ref Range Status  11/29/2023 133 (H) <100 mg/dL Final    Comment:    . Desirable range <100 mg/dL for primary prevention;   <70 mg/dL for patients with CHD or diabetic patients  with > or = 2 CHD risk factors. [name]    HDL  Date Value Ref Range Status  10/25/2023 44 (L) > OR = 50 mg/dL Final  89/93/7982 28 (L) >39 mg/dL Final   Triglycerides  Date Value Ref Range Status  11/29/2023 514 (H) <150 mg/dL Final    Comment:    . If a non-fasting specimen was collected, consider repeat triglyceride testing on a fasting specimen if clinically indicated.  Veatrice et al. J. of Clin. Lipidol. 2015;9:129-169. . . There is increased risk of pancreatitis when the  triglyceride concentration is very high  (> or = 500 mg/dL, especially if > or = 8999 mg/dL).  Veatrice et al. J. of Clin. Lipidol. 2015;9:129-169. .    Triglycerides  Piccolo,Waived  Date Value Ref Range Status  01/12/2017 172 (H) <150 mg/dL Final    Comment:                            Normal                   <150                         Borderline High     150 - 199                         High                200 - 499                         Very High                >499          Passed - ALT in normal range and within 360 days    ALT  Date Value Ref Range Status  08/17/2024 32 0 - 44 U/L Final   SGPT (ALT)  Date Value Ref Range Status  03/11/2015 32 U/L Final    Comment:    14-54 NOTE: New Reference Range  01/29/15          Passed - Patient is not pregnant      Passed - Valid encounter within last 12 months    Recent Outpatient Visits           4 months ago Type 2 diabetes mellitus with other specified complication, without long-term current use of insulin  (HCC)  [facility] Ed[place] Marsa PARAS, DO   8 months ago Centrilobular emphysema [facility])   [place] [facility] Ed[place] Marsa PARAS, DO       Future Appointments             In 4 days Hester Alm BROCKS, MD [place] [facility]

## 2024-09-04 ENCOUNTER — Other Ambulatory Visit: Payer: Self-pay

## 2024-09-04 ENCOUNTER — Other Ambulatory Visit (HOSPITAL_COMMUNITY): Payer: Self-pay

## 2024-09-04 ENCOUNTER — Encounter (INDEPENDENT_AMBULATORY_CARE_PROVIDER_SITE_OTHER): Payer: Self-pay

## 2024-09-04 ENCOUNTER — Other Ambulatory Visit: Payer: Self-pay | Admitting: Oncology

## 2024-09-04 DIAGNOSIS — C50919 Malignant neoplasm of unspecified site of unspecified female breast: Secondary | ICD-10-CM

## 2024-09-04 MED ORDER — RIBOCICLIB SUCC (400 MG DOSE) 200 MG PO TBPK
400.0000 mg | ORAL_TABLET | Freq: Every day | ORAL | 1 refills | Status: DC
  Filled 2024-09-04: qty 42, 21d supply, fill #0
  Filled 2024-09-04: qty 42, 28d supply, fill #0
  Filled 2024-10-02: qty 42, 28d supply, fill #1

## 2024-09-04 NOTE — Progress Notes (Signed)
 Specialty Pharmacy Refill Coordination Note  Cassidy Norris is a 73 y.o. female contacted today regarding refills of specialty medication(s) Ribociclib  Succinate (KISQALI  400mg  Daily Dose)   Patient requested Delivery   Delivery date: 09/06/24   Verified address: [address] [place] [address]   Medication will be filled on 09/05/24.

## 2024-09-05 ENCOUNTER — Ambulatory Visit: Admitting: Dermatology

## 2024-09-05 ENCOUNTER — Encounter: Payer: Self-pay | Admitting: Dermatology

## 2024-09-05 ENCOUNTER — Other Ambulatory Visit: Payer: Self-pay

## 2024-09-05 ENCOUNTER — Other Ambulatory Visit (HOSPITAL_COMMUNITY): Payer: Self-pay

## 2024-09-05 DIAGNOSIS — W908XXA Exposure to other nonionizing radiation, initial encounter: Secondary | ICD-10-CM

## 2024-09-05 DIAGNOSIS — C50411 Malignant neoplasm of upper-outer quadrant of right female breast: Secondary | ICD-10-CM | POA: Diagnosis not present

## 2024-09-05 DIAGNOSIS — L820 Inflamed seborrheic keratosis: Secondary | ICD-10-CM

## 2024-09-05 DIAGNOSIS — L578 Other skin changes due to chronic exposure to nonionizing radiation: Secondary | ICD-10-CM | POA: Diagnosis not present

## 2024-09-05 DIAGNOSIS — C50412 Malignant neoplasm of upper-outer quadrant of left female breast: Secondary | ICD-10-CM

## 2024-09-05 DIAGNOSIS — Z1283 Encounter for screening for malignant neoplasm of skin: Secondary | ICD-10-CM | POA: Diagnosis not present

## 2024-09-05 DIAGNOSIS — D229 Melanocytic nevi, unspecified: Secondary | ICD-10-CM

## 2024-09-05 DIAGNOSIS — Z853 Personal history of malignant neoplasm of breast: Secondary | ICD-10-CM

## 2024-09-05 DIAGNOSIS — L821 Other seborrheic keratosis: Secondary | ICD-10-CM | POA: Diagnosis not present

## 2024-09-05 DIAGNOSIS — Z8589 Personal history of malignant neoplasm of other organs and systems: Secondary | ICD-10-CM

## 2024-09-05 DIAGNOSIS — D1801 Hemangioma of skin and subcutaneous tissue: Secondary | ICD-10-CM

## 2024-09-05 DIAGNOSIS — Z86007 Personal history of in-situ neoplasm of skin: Secondary | ICD-10-CM | POA: Diagnosis not present

## 2024-09-05 DIAGNOSIS — L814 Other melanin hyperpigmentation: Secondary | ICD-10-CM | POA: Diagnosis not present

## 2024-09-05 NOTE — Patient Instructions (Addendum)
 Seborrheic Keratosis  What causes seborrheic keratoses? Seborrheic keratoses are harmless, common skin growths that first appear during adult life.  As time goes by, more growths appear.  Some people may develop a large number of them.  Seborrheic keratoses appear on both covered and uncovered body parts.  They are not caused by sunlight.  The tendency to develop seborrheic keratoses can be inherited.  They vary in color from skin-colored to gray, brown, or even black.  They can be either smooth or have a rough, warty surface.   Seborrheic keratoses are superficial and look as if they were stuck on the skin.  Under the microscope this type of keratosis looks like layers upon layers of skin.  That is why at times the top layer may seem to fall off, but the rest of the growth remains and re-grows.    Treatment Seborrheic keratoses do not need to be treated, but can easily be removed in the office.  Seborrheic keratoses often cause symptoms when they rub on clothing or jewelry.  Lesions can be in the way of shaving.  If they become inflamed, they can cause itching, soreness, or burning.  Removal of a seborrheic keratosis can be accomplished by freezing, burning, or surgery. If any spot bleeds, scabs, or grows rapidly, please return to have it checked, as these can be an indication of a skin cancer.   Cryotherapy Aftercare  Wash gently with soap and water everyday.   Apply Vaseline and Band-Aid daily until healed.     Melanoma ABCDEs  Melanoma is the most dangerous type of skin cancer, and is the leading cause of death from skin disease.  You are more likely to develop melanoma if you: Have light-colored skin, light-colored eyes, or red or blond hair Spend a lot of time in the sun Tan regularly, either outdoors or in a tanning bed Have had blistering sunburns, especially during childhood Have a close family member who has had a melanoma Have atypical moles or large birthmarks  Early detection  of melanoma is key since treatment is typically straightforward and cure rates are extremely high if we catch it early.   The first sign of melanoma is often a change in a mole or a new dark spot.  The ABCDE system is a way of remembering the signs of melanoma.  A for asymmetry:  The two halves do not match. B for border:  The edges of the growth are irregular. C for color:  A mixture of colors are present instead of an even brown color. D for diameter:  Melanomas are usually (but not always) greater than 6mm - the size of a pencil eraser. E for evolution:  The spot keeps changing in size, shape, and color.  Please check your skin once per month between visits. You can use a small mirror in front and a large mirror behind you to keep an eye on the back side or your body.   If you see any new or changing lesions before your next follow-up, please call to schedule a visit.  Please continue daily skin protection including broad spectrum sunscreen SPF 30+ to sun-exposed areas, reapplying every 2 hours as needed when you're outdoors.   Staying in the shade or wearing long sleeves, sun glasses (UVA+UVB protection) and wide brim hats (4-inch brim around the entire circumference of the hat) are also recommended for sun protection.     Due to recent changes in healthcare laws, you may see results of your  pathology and/or laboratory studies on MyChart before the doctors have had a chance to review them. We understand that in some cases there may be results that are confusing or concerning to you. Please understand that not all results are received at the same time and often the doctors may need to interpret multiple results in order to provide you with the best plan of care or course of treatment. Therefore, we ask that you please give us  2 business days to thoroughly review all your results before contacting the office for clarification. Should we see a critical lab result, you will be contacted  sooner.   If You Need Anything After Your Visit  If you have any questions or concerns for your doctor, please call our main line at [phone] and press option 4 to reach your doctor's medical assistant. If no one answers, please leave a voicemail as directed and we will return your call as soon as possible. Messages left after 4 pm will be answered the following business day.   You may also send us  a message via MyChart. We typically respond to MyChart messages within 1-2 business days.  For prescription refills, please ask your pharmacy to contact our office. Our fax number is [phone].  If you have an urgent issue when the clinic is closed that cannot wait until the next business day, you can page your doctor at the number below.    Please note that while we do our best to be available for urgent issues outside of office hours, we are not available 24/7.   If you have an urgent issue and are unable to reach us , you may choose to seek medical care at your doctor's office, retail clinic, urgent care center, or emergency room.  If you have a medical emergency, please immediately call 911 or go to the emergency department.  Pager Numbers  - Dr. Hester: [phone]  - Dr. Jackquline: [phone]  - Dr. Claudene: [phone]   - Dr. Raymund: [phone]  In the event of inclement weather, please call our main line at [phone] for an update on the status of any delays or closures.  Dermatology Medication Tips: Please keep the boxes that topical medications come in in order to help keep track of the instructions about where and how to use these. Pharmacies typically print the medication instructions only on the boxes and not directly on the medication tubes.   If your medication is too expensive, please contact our office at [phone] option 4 or send us  a message through MyChart.   We are unable to tell what your co-pay for medications will be in advance as this is  different depending on your insurance coverage. However, we may be able to find a substitute medication at lower cost or fill out paperwork to get insurance to cover a needed medication.   If a prior authorization is required to get your medication covered by your insurance company, please allow us  1-2 business days to complete this process.  Drug prices often vary depending on where the prescription is filled and some pharmacies may offer cheaper prices.  The website www.goodrx.com contains coupons for medications through different pharmacies. The prices here do not account for what the cost may be with help from insurance (it may be cheaper with your insurance), but the website can give you the price if you did not use any insurance.  - You can print the associated coupon and take it with your prescription to the pharmacy.  -  You may also stop by our office during regular business hours and pick up a GoodRx coupon card.  - If you need your prescription sent electronically to a different pharmacy, notify our office through [facility] or by phone at [phone] option 4.     Si Usted Necesita Algo Despus de Su Visita  Tambin puede enviarnos un mensaje a travs de Clinical cytogeneticist. Por lo general respondemos a los mensajes de MyChart en el transcurso de 1 a 2 das hbiles.  Para renovar recetas, por favor pida a su farmacia que se ponga en contacto con nuestra oficina. Randi lakes de fax es [place] [phone].  Si tiene un asunto urgente cuando la clnica est cerrada y que no puede esperar hasta el siguiente da hbil, puede llamar/localizar a su doctor(a) al nmero que aparece a continuacin.   Por favor, tenga en cuenta que aunque hacemos todo lo posible para estar disponibles para asuntos urgentes fuera del horario de [place], no estamos disponibles las 24 horas del da, los 7 [address]  [address] de la [place].   Si tiene un problema urgente y no puede comunicarse con nosotros, puede optar por buscar  atencin mdica  en el consultorio de su doctor(a), en una clnica privada, en un centro de atencin urgente o en una sala de emergencias.  Si tiene Engineer, drilling, por favor llame inmediatamente al 911 o vaya a la sala de emergencias.  Nmeros de bper  - Dr. Hester: [phone]  - Dra. Jackquline: [phone]  - Dr. Claudene: [phone]  - Dra. Kitts: [phone]  En caso de inclemencias del [place], por favor llame a nuestra lnea principal al [phone] para una actualizacin sobre el estado de cualquier retraso o cierre.  Consejos para la medicacin en dermatologa: Por favor, guarde las cajas en las que vienen los medicamentos de uso tpico para ayudarle a seguir las instrucciones sobre dnde y cmo usarlos. Las farmacias generalmente imprimen las instrucciones del medicamento slo en las cajas y no directamente en los tubos del [place].   Si su medicamento es muy caro, por favor, pngase en contacto con landry rieger llamando al [phone] y presione la opcin 4 o envenos un mensaje a travs de Clinical cytogeneticist.   No podemos decirle cul ser su copago por los medicamentos por adelantado ya que esto es diferente dependiendo de la cobertura de su seguro. Sin embargo, es posible que podamos encontrar un medicamento sustituto a Audiological scientist un formulario para que el seguro cubra el medicamento que se considera necesario.   Si se requiere una autorizacin previa para que su compaa de seguros malta su medicamento, por favor permtanos de 1 a 2 das hbiles para completar este proceso.  Los precios de los medicamentos varan con frecuencia dependiendo del Environmental consultant de dnde se surte la receta y alguna farmacias pueden ofrecer precios ms baratos.  El sitio web www.goodrx.com tiene cupones para medicamentos de Health and safety inspector. Los precios aqu no tienen en cuenta lo que podra costar con la ayuda del seguro (puede ser ms barato con su seguro), pero el sitio web puede  darle el precio si no utiliz Tourist information centre manager.  - Puede imprimir el cupn correspondiente y llevarlo con su receta a la farmacia.  - Tambin puede pasar por nuestra oficina durante el horario de atencin regular y Education officer, museum una tarjeta de cupones de GoodRx.  - Si necesita que su receta se enve electrnicamente a Psychiatrist, informe a nuestra oficina a travs de MyChart de [employer] o por  telfono llamando al [phone] y presione la opcin 4.

## 2024-09-05 NOTE — Progress Notes (Signed)
 Follow-Up Visit   Subjective  Cassidy Norris is a 73 y.o. female who presents for the following: Skin Cancer Screening and Full Body Skin Exam Hx of sccis, hx of aks and isks   Patient reports a spot at chest that dog recently scratched would like checked and some spots on scalp  The patient presents for Total-Body Skin Exam (TBSE) for skin cancer screening and mole check. The patient has spots, moles and lesions to be evaluated, some may be new or changing and the patient may have concern these could be cancer.  The following portions of the chart were reviewed this encounter and updated as appropriate: medications, allergies, medical history  Review of Systems:  No other skin or systemic complaints except as noted in HPI or Assessment and Plan.  Objective  Well appearing patient in no apparent distress; mood and affect are within normal limits.  A full examination was performed including scalp, head, eyes, ears, nose, lips, neck, chest, axillae, abdomen, back, buttocks, bilateral upper extremities, bilateral lower extremities, hands, feet, fingers, toes, fingernails, and toenails. All findings within normal limits unless otherwise noted below.   Relevant physical exam findings are noted in the Assessment and Plan.  scalp, face, ears , left infrascapular x 19 (19) Erythematous stuck-on, waxy papule or plaque  Assessment & Plan   HISTORY OF SQUAMOUS CELL CARCINOMA IN SITU OF THE SKIN - R of midline ant scalp anterior, R of midline ant scalp posterior - clear today - No evidence of recurrence today - Recommend regular full body skin exams - Recommend daily broad spectrum sunscreen SPF 30+ to sun-exposed areas, reapply every 2 hours as needed.  - Call if any new or changing lesions are noted between office visits    SKIN CANCER SCREENING PERFORMED TODAY.  ACTINIC DAMAGE - Chronic condition, secondary to cumulative UV/sun exposure - diffuse scaly erythematous macules with  underlying dyspigmentation - Recommend daily broad spectrum sunscreen SPF 30+ to sun-exposed areas, reapply every 2 hours as needed.  - Staying in the shade or wearing long sleeves, sun glasses (UVA+UVB protection) and wide brim hats (4-inch brim around the entire circumference of the hat) are also recommended for sun protection.  - Call for new or changing lesions.  LENTIGINES, SEBORRHEIC KERATOSES, HEMANGIOMAS - Benign normal skin lesions - Benign-appearing - Call for any changes  MELANOCYTIC NEVI - Tan-brown and/or pink-flesh-colored symmetric macules and papules - Benign appearing on exam today - Observation - Call clinic for new or changing moles - Recommend daily use of broad spectrum spf 30+ sunscreen to sun-exposed areas.   Bilateral malignant neoplasm of breast in female, unspecified estrogen receptor status, unspecified site of breast (HCC) left breast, right breast Recurrent breast cancer  Continue treatment as directed by oncologist Dr. Jacobo  - Recommend regular full body skin exams - Recommend daily broad spectrum sunscreen SPF 30+ to sun-exposed areas, reapply every 2 hours as needed.  INFLAMED SEBORRHEIC KERATOSIS (19) scalp, face, ears , left infrascapular x 19 (19) Symptomatic, irritating, patient would like treated. Destruction of lesion - scalp, face, ears , left infrascapular x 19 (19) Complexity: simple   Destruction method: cryotherapy   Informed consent: discussed and consent obtained   Timeout:  patient name, date of birth, surgical site, and procedure verified Lesion destroyed using liquid nitrogen: Yes   Region frozen until ice ball extended beyond lesion: Yes   Outcome: patient tolerated procedure well with no complications   Post-procedure details: wound care instructions given  Return in about 1 year (around 09/05/2025) for TBSE.  IEleanor Blush, CMA, am acting as scribe for Alm Rhyme, MD.   Documentation: I have reviewed the above  documentation for accuracy and completeness, and I agree with the above.  Alm Rhyme, MD

## 2024-09-13 NOTE — Progress Notes (Unsigned)
 Oral Chemotherapy Clinic [facility]  [phone] [phone]  Patient Care Team: Edman Marsa PARAS, DO as PCP - General (Family Medicine) Darron Deatrice LABOR, MD as PCP - Cardiology (Cardiology) Skeet Juliene SAUNDERS, DO as Consulting Physician (Neurology) Jacobo Evalene PARAS, MD as Consulting Physician (Oncology) Maurie Rayfield BIRCH, RN as Oncology Nurse Navigator Mevelyn BIRCH [place], [place] (Optometry)   Name of the patient: Cassidy Norris  [id number]  September 13, 1951   Date of visit: 09/14/24  HPI: Patient is a 73 y.o. female with recurrent breast cancer, ER/PR positive/HER2 negative. Currently treating with Kisqali  (ribociclib ) and fulvestrant . She started ribociclib  on 01/08/22. Her treatment was held for surgery which was completed on 08/04/23. Patient resumed her ribociclib  on 08/19/23. Patient was dosed reduced to 400mg  dosing on 04/27/24 due to fatigue.  Reason for Consult: Oral chemotherapy follow-up for ribociclib  therapy.   PAST MEDICAL HISTORY: Past Medical History:  Diagnosis Date   Actinic keratosis    Anemia    Arthritis not really diagnosis but have pain   B12 deficiency 07/23/2016   Will check levels to determine if injections are needed again. Await results. Recent CBC at Onc [facility]NL   Blood transfusion without reported diagnosis 1983 had a miscarriage/dnc   Cancer (HCC) 12/30/2013   Multifocal disease: pT1c,m, N0(isolated tumor cells) Er/ PR positive, Her 2 negative.   Cancer (HCC) 01/09/2014   Invasive lobular carcinoma, 2.2 cm, T2,N1 ER/PR positive, HER-2/neu negative   Cataract 2014?   Centrilobular emphysema (HCC) 10/27/2021   CKD (chronic kidney disease), stage IV (HCC) 07/13/2023   Complication of anesthesia 03/06/2022   Prolonged metabolism of Rocuronium  during case.   Diabetes mellitus without complication (HCC)    Diffuse cystic mastopathy    Erosive esophagitis    Essential hypertension, benign 09/24/2017   Family history of  malignant neoplasm of gastrointestinal tract 2012   Fractured elbow 08/28/2014   Gastroesophageal reflux disease    Hx of metabolic acidosis with increased anion gap    Increased heart rate    Multiple sclerosis    Neuromuscular disorder (HCC)    neuropathy in bil feet - left is worse.   Obesity, unspecified    Peripheral neuropathy    Personal history of tobacco use, presenting hazards to health    Restless leg syndrome    Sleep apnea    uses CPAP   Special screening for malignant neoplasms, colon    Squamous cell carcinoma of skin 08/24/2022   SCCIS - scalp, ED&C   Squamous cell carcinoma of skin 07/20/2023   SCC IS right of midline ant scalp post, tx with ED&C   Squamous cell carcinoma of skin 07/20/2023   SCC IS right of midline ant scalp ant, tx with [facility]    HEMATOLOGY/ONCOLOGY HISTORY:  Oncology History Overview Note  1. Bilateral carcinoma of breast, February, 2015. Right breast status post radical mastectomy. T1 C. N0M0 (isolated tumor cells in one lymph node) multifocal invasive cancer. Stage IC, Left breast, Status post radical mastectomy, T2, N1, M0 tumor. Stage II, RIght breast. Both tumors are estrogen receptor positive.  Progesterone receptor positive.  HER-2 receptor negative by FISH. Negative for BRCA mutation.  2. Started on Adriamycin and Cytoxan on March 01, 2014. Last cycle of Cytoxan and Adriamycin on May 01, 2014. Patient has finished last chemotherapy on September 9. (12 th dose was omitted because of neuropathy) 3.  Taking tamoxifen .  Patient had a poor tolerance to letrozole.(October, 2015) 4.  Patient is taking letrozole since  November of 2015 5.  May, 2016 Letrozole has been put on hold because of bony pains 6.  Started on Aromasin  from July of 2016    History of bilateral breast cancer (Resolved)  11/29/2013 Initial Diagnosis   Breast cancer bilateral, multifocal ER/PR pos, Her 2 neg,.     ALLERGIES:  has no known allergies.  MEDICATIONS:   Current Outpatient Medications  Medication Sig Dispense Refill   acetaminophen  (TYLENOL ) 500 MG tablet Take 1,000 mg by mouth 2 (two) times daily as needed for moderate pain or headache.     baclofen  (LIORESAL ) 10 MG tablet TAKE 1 TABLET BY MOUTH THREE TIMES A DAY AS NEEDED FOR MUSCLE SPASM 270 tablet 1   calcium  carbonate (OSCAL) 1500 (600 Ca) MG TABS tablet Take by mouth 2 (two) times daily with a meal.     dapagliflozin propanediol (FARXIGA) 10 MG TABS tablet Take 10 mg by mouth daily.     diclofenac  Sodium (VOLTAREN ) 1 % GEL Apply 2 g topically 3 (three) times daily as needed. 100 g 2   ezetimibe  (ZETIA ) 10 MG tablet Take 1 tablet (10 mg total) by mouth daily. 90 tablet 3   fluticasone  (FLONASE ) 50 MCG/ACT nasal spray PLACE 2 SPRAYS INTO BOTH NOSTRILS AT BEDTIME. 48 g 1   fulvestrant  (FASLODEX ) 250 MG/5ML injection Inject 500 mg into the muscle every 30 (thirty) days. One injection each buttock over 1-2 minutes. Warm prior to use.     Krill Oil 1000 MG CAPS Take 1,000 mg by mouth daily.     lisinopril  (ZESTRIL ) 5 MG tablet TAKE 1 TABLET (5 MG TOTAL) BY MOUTH DAILY. 90 tablet 0   loperamide  (IMODIUM ) 2 MG capsule TAKE 2 TABLETS (4 MG TOTAL) BY MOUTH 4 (FOUR) TIMES DAILY AS NEEDED FOR DIARRHEA OR LOOSE STOOLS. 30 capsule 1   metFORMIN  (GLUCOPHAGE ) 500 MG tablet TAKE 1 TABLET BY MOUTH 2 TIMES DAILY WITH A MEAL. 180 tablet 1   metoprolol  tartrate (LOPRESSOR ) 25 MG tablet TAKE 1/2 TABLET TWICE A DAY BY MOUTH 90 tablet 3   omeprazole  (PRILOSEC) 40 MG capsule TAKE 1 CAPSULE BY MOUTH TWICE A DAY 180 capsule 1   ondansetron  (ZOFRAN ) 8 MG tablet TAKE 1 TABLET BY MOUTH EVERY 8 HOURS AS NEEDED FOR NAUSEA OR VOMITING. 20 tablet 0   prochlorperazine  (COMPAZINE ) 10 MG tablet Take 1 tablet (10 mg total) by mouth every 6 (six) hours as needed for nausea or vomiting. 20 tablet 1   ribociclib  succ (KISQALI  400MG  DAILY DOSE) 200 MG Therapy Pack Take 2 tablets (400 mg total) by mouth daily. Take for 21 days on, 7  days off, repeat every 28 days. 42 tablet 1   sodium chloride  (OCEAN) 0.65 % SOLN nasal spray Place 1 spray into both nostrils as needed for congestion.     traZODone  (DESYREL ) 50 MG tablet TAKE 1 TABLET (50 MG TOTAL) BY MOUTH AT BEDTIME AS NEEDED. FOR SLEEP 90 tablet 1   venlafaxine  XR (EFFEXOR -XR) 75 MG 24 hr capsule Take 1 capsule (75 mg total) by mouth daily with breakfast. 90 capsule 3   vitamin B-12 (CYANOCOBALAMIN ) 1000 MCG tablet Take 2,500 mcg by mouth every Monday, Wednesday, and Friday.     Vitamin D , Cholecalciferol , 50 MCG (2000 UT) CAPS Take 2,000 Units by mouth every Monday, Wednesday, and Friday.     No current facility-administered medications for this visit.    VITAL SIGNS: BP (!) 133/99 (BP Location: Left Arm, Patient Position: Sitting, Cuff Size: Normal)  Pulse 96   LMP 12/22/1999 (Approximate)  There were no vitals filed for this visit.      Estimated body mass index is 34.11 kg/m as calculated from the following:   Height as of 07/20/24: 5' 5 (1.651 m).   Weight as of 08/17/24: 93 kg (205 lb).  LABS: CBC:    Component Value Date/Time   WBC 4.8 09/14/2024 0910   WBC 4.0 03/30/2024 0950   HGB 10.1 (L) 09/14/2024 0910   HGB 15.5 07/13/2017 0937   HCT 31.0 (L) 09/14/2024 0910   HCT 46.7 (H) 07/13/2017 0937   PLT 176 09/14/2024 0910   PLT 211 07/13/2017 0937   MCV 106.5 (H) 09/14/2024 0910   MCV 94 07/13/2017 0937   MCV 93 03/11/2015 1356   NEUTROABS 2.7 09/14/2024 0910   NEUTROABS 5.4 07/13/2017 0937   NEUTROABS 5.6 03/11/2015 1356   LYMPHSABS 1.3 09/14/2024 0910   LYMPHSABS 2.1 07/13/2017 0937   LYMPHSABS 2.3 03/11/2015 1356   MONOABS 0.6 09/14/2024 0910   MONOABS 1.0 (H) 03/11/2015 1356   EOSABS 0.2 09/14/2024 0910   EOSABS 0.2 07/13/2017 0937   EOSABS 0.3 03/11/2015 1356   BASOSABS 0.1 09/14/2024 0910   BASOSABS 0.0 07/13/2017 0937   BASOSABS 0.1 03/11/2015 1356   Comprehensive Metabolic Panel:    Component Value Date/Time   NA 136  09/14/2024 0911   NA 142 07/14/2019 1516   NA 139 03/11/2015 1356   K 4.5 09/14/2024 0911   K 3.4 (L) 03/11/2015 1356   CL 102 09/14/2024 0911   CL 103 03/11/2015 1356   CO2 22 09/14/2024 0911   CO2 28 03/11/2015 1356   BUN 29 (H) 09/14/2024 0911   BUN 12 07/14/2019 1516   BUN 13 03/11/2015 1356   CREATININE 1.68 (H) 09/14/2024 0911   CREATININE 0.85 10/20/2021 0838   GLUCOSE 132 (H) 09/14/2024 0911   GLUCOSE 118 (H) 03/11/2015 1356   CALCIUM  9.3 09/14/2024 0911   CALCIUM  9.2 03/11/2015 1356   AST 46 (H) 09/14/2024 0911   ALT 36 09/14/2024 0911   ALT 32 03/11/2015 1356   ALKPHOS 80 09/14/2024 0911   ALKPHOS 68 03/11/2015 1356   BILITOT 0.6 09/14/2024 0911   PROT 7.3 09/14/2024 0911   PROT 6.9 07/14/2019 1516   PROT 6.9 03/11/2015 1356   ALBUMIN 3.9 09/14/2024 0911   ALBUMIN 4.5 07/14/2019 1516   ALBUMIN 4.2 03/11/2015 1356     Present during today's visit: patient only   Assessment and Plan: CBC/CMP reviewed (note: ca27.29 is still pending), continue ribociclib  400mg  21on/7off, scheduled to begin new cycle today 09/14/24 Patient to receive fulvestrant  today Renal function stable PET on 08/02/24 showed continued treatment response   Patient continues to tolerate therapy well since her dose decrease to 400mg  on 04/27/24 Patient reports feeling gassy recently, she will try OTC Gas-X   Oral Chemotherapy Adherence: No missed doses reported in the last cycle There are no patient barriers to medication adherence identified.   New medications: None reported  Medication Access Issues: No issues, receives Health and safety inspector and fill at [employer] (Specialty).   Patient expressed understanding and was in agreement with this plan. She also understands that She can call clinic at any time with any questions, concerns, or complaints.   Follow-up plan: RTC in 4 weeks.   Thank you for allowing me to participate in the care of this very pleasant patient.   Time Total: 15  minutes  Visit consisted of counseling and  education on dealing with issues of symptom management in the setting of serious and potentially life-threatening illness.Greater than 50%  of this time was spent counseling and coordinating care related to the above assessment and plan.  Signed by: Dan Dissinger N. Loree Shehata, PharmD, NEILA, CPP Hematology/Oncology Clinical Pharmacist Practitioner [place] [phone]  09/14/2024 11:22 AM

## 2024-09-14 ENCOUNTER — Inpatient Hospital Stay: Attending: Oncology

## 2024-09-14 ENCOUNTER — Inpatient Hospital Stay: Admitting: Pharmacist

## 2024-09-14 ENCOUNTER — Inpatient Hospital Stay

## 2024-09-14 VITALS — BP 133/99 | HR 96

## 2024-09-14 DIAGNOSIS — Z170 Estrogen receptor positive status [ER+]: Secondary | ICD-10-CM | POA: Diagnosis not present

## 2024-09-14 DIAGNOSIS — Z1732 Human epidermal growth factor receptor 2 negative status: Secondary | ICD-10-CM | POA: Diagnosis not present

## 2024-09-14 DIAGNOSIS — C778 Secondary and unspecified malignant neoplasm of lymph nodes of multiple regions: Secondary | ICD-10-CM | POA: Insufficient documentation

## 2024-09-14 DIAGNOSIS — C50919 Malignant neoplasm of unspecified site of unspecified female breast: Secondary | ICD-10-CM

## 2024-09-14 DIAGNOSIS — Z1721 Progesterone receptor positive status: Secondary | ICD-10-CM | POA: Diagnosis not present

## 2024-09-14 DIAGNOSIS — C50911 Malignant neoplasm of unspecified site of right female breast: Secondary | ICD-10-CM | POA: Insufficient documentation

## 2024-09-14 DIAGNOSIS — Z5111 Encounter for antineoplastic chemotherapy: Secondary | ICD-10-CM | POA: Insufficient documentation

## 2024-09-14 LAB — CBC WITH DIFFERENTIAL (CANCER CENTER ONLY)
Abs Immature Granulocytes: 0.02 K/uL (ref 0.00–0.07)
Basophils Absolute: 0.1 K/uL (ref 0.0–0.1)
Basophils Relative: 2 %
Eosinophils Absolute: 0.2 K/uL (ref 0.0–0.5)
Eosinophils Relative: 4 %
HCT: 31 % — ABNORMAL LOW (ref 36.0–46.0)
Hemoglobin: 10.1 g/dL — ABNORMAL LOW (ref 12.0–15.0)
Immature Granulocytes: 0 %
Lymphocytes Relative: 26 %
Lymphs Abs: 1.3 K/uL (ref 0.7–4.0)
MCH: 34.7 pg — ABNORMAL HIGH (ref 26.0–34.0)
MCHC: 32.6 g/dL (ref 30.0–36.0)
MCV: 106.5 fL — ABNORMAL HIGH (ref 80.0–100.0)
Monocytes Absolute: 0.6 K/uL (ref 0.1–1.0)
Monocytes Relative: 13 %
Neutro Abs: 2.7 K/uL (ref 1.7–7.7)
Neutrophils Relative %: 55 %
Platelet Count: 176 K/uL (ref 150–400)
RBC: 2.91 MIL/uL — ABNORMAL LOW (ref 3.87–5.11)
RDW: 14.6 % (ref 11.5–15.5)
WBC Count: 4.8 K/uL (ref 4.0–10.5)
nRBC: 0 % (ref 0.0–0.2)

## 2024-09-14 LAB — CMP (CANCER CENTER ONLY)
ALT: 36 U/L (ref 0–44)
AST: 46 U/L — ABNORMAL HIGH (ref 15–41)
Albumin: 3.9 g/dL (ref 3.5–5.0)
Alkaline Phosphatase: 80 U/L (ref 38–126)
Anion gap: 12 (ref 5–15)
BUN: 29 mg/dL — ABNORMAL HIGH (ref 8–23)
CO2: 22 mmol/L (ref 22–32)
Calcium: 9.3 mg/dL (ref 8.9–10.3)
Chloride: 102 mmol/L (ref 98–111)
Creatinine: 1.68 mg/dL — ABNORMAL HIGH (ref 0.44–1.00)
GFR, Estimated: 32 mL/min — ABNORMAL LOW (ref >60)
Glucose, Bld: 132 mg/dL — ABNORMAL HIGH (ref 70–99)
Potassium: 4.5 mmol/L (ref 3.5–5.1)
Sodium: 136 mmol/L (ref 135–145)
Total Bilirubin: 0.6 mg/dL (ref 0.0–1.2)
Total Protein: 7.3 g/dL (ref 6.5–8.1)

## 2024-09-14 MED ORDER — FULVESTRANT 250 MG/5ML IM SOSY
500.0000 mg | PREFILLED_SYRINGE | Freq: Once | INTRAMUSCULAR | Status: AC
  Administered 2024-09-14: 500 mg via INTRAMUSCULAR
  Filled 2024-09-14: qty 10

## 2024-09-15 LAB — CANCER ANTIGEN 27.29: CA 27.29: 68.4 U/mL — ABNORMAL HIGH (ref 0.0–38.6)

## 2024-09-26 ENCOUNTER — Other Ambulatory Visit: Payer: Self-pay

## 2024-09-28 ENCOUNTER — Encounter: Payer: Self-pay | Admitting: Oncology

## 2024-09-28 ENCOUNTER — Other Ambulatory Visit (HOSPITAL_COMMUNITY): Payer: Self-pay

## 2024-09-28 ENCOUNTER — Telehealth: Payer: Self-pay | Admitting: Pharmacy Technician

## 2024-09-28 NOTE — Telephone Encounter (Signed)
 Oral Oncology Patient Advocate Encounter  Was successful in securing patient a $7500 grant from [facility] to provide copayment coverage for Kisqali .  This will keep the out of pocket expense at $0.     [employer] ID: [id number]   The billing information is as follows and has been shared with [facility].    RxBin: [id number] PCN: [id number] Member ID: [id number] Group ID: [id number] Dates of Eligibility: 08/29/2024 through 08/28/2025  Fund:  Breast Cancer - Medicare Access  [place] (Patty) Chet Burnet, CPhT  [facility], Zelda Salmon, [facility]/Oncology - Oral Chemotherapy Patient Advocate Specialist III Phone: [phone]  Fax: [phone]

## 2024-09-29 ENCOUNTER — Other Ambulatory Visit (HOSPITAL_COMMUNITY): Payer: Self-pay

## 2024-10-02 ENCOUNTER — Other Ambulatory Visit: Payer: Self-pay

## 2024-10-02 ENCOUNTER — Encounter (INDEPENDENT_AMBULATORY_CARE_PROVIDER_SITE_OTHER): Payer: Self-pay

## 2024-10-03 ENCOUNTER — Other Ambulatory Visit: Payer: Self-pay

## 2024-10-03 ENCOUNTER — Other Ambulatory Visit (HOSPITAL_COMMUNITY): Payer: Self-pay

## 2024-10-03 NOTE — Progress Notes (Signed)
 Specialty Pharmacy Refill Coordination Note  MyChart Questionnaire Submission  Cassidy Norris is a 73 y.o. female contacted today regarding refills of specialty medication(s) Kisqali .  Doses on hand: (Patient-Rptd) 2   Patient requested: (Patient-Rptd) Delivery   Delivery date: 10/04/24  Verified address: [address] [place] [address]  Medication will be filled on 10/03/24.   Next cycle approx 10/11/24

## 2024-10-10 ENCOUNTER — Other Ambulatory Visit: Payer: Self-pay

## 2024-10-10 NOTE — Progress Notes (Signed)
 Specialty Pharmacy Ongoing Clinical Assessment Note  Cassidy Norris is a 73 y.o. female who is being followed by the specialty pharmacy service for RxSp Oncology   Patient's specialty medication(s) reviewed today: Ribociclib  Succinate (KISQALI  400mg  Daily Dose)   Missed doses in the last 4 weeks: 0   Patient/Caregiver did not have any additional questions or concerns.   Therapeutic benefit summary: Patient is achieving benefit   Adverse events/side effects summary: Experienced adverse events/side effects (occasional diarrhea but has meds on hand to help keep it tolerable)   Patient's therapy is appropriate to: Continue    Goals Addressed             This Visit's Progress    Slow Disease Progression   On track    Patient is on track. Patient will maintain adherence.  CA 27.29 68.4 as of 09/14/24. PET scan in September showed no evidence of active malignancy        Follow up: 3 months  [facility] Orthoptist

## 2024-10-12 ENCOUNTER — Inpatient Hospital Stay: Attending: Oncology

## 2024-10-12 ENCOUNTER — Encounter: Payer: Self-pay | Admitting: Oncology

## 2024-10-12 ENCOUNTER — Inpatient Hospital Stay (HOSPITAL_BASED_OUTPATIENT_CLINIC_OR_DEPARTMENT_OTHER): Admitting: Oncology

## 2024-10-12 ENCOUNTER — Inpatient Hospital Stay

## 2024-10-12 VITALS — BP 122/82 | HR 72 | Temp 97.1°F | Resp 18 | Ht 65.0 in | Wt 199.0 lb

## 2024-10-12 DIAGNOSIS — C50919 Malignant neoplasm of unspecified site of unspecified female breast: Secondary | ICD-10-CM

## 2024-10-12 DIAGNOSIS — G35D Multiple sclerosis, unspecified: Secondary | ICD-10-CM | POA: Diagnosis not present

## 2024-10-12 DIAGNOSIS — Z9013 Acquired absence of bilateral breasts and nipples: Secondary | ICD-10-CM | POA: Diagnosis not present

## 2024-10-12 DIAGNOSIS — Z171 Estrogen receptor negative status [ER-]: Secondary | ICD-10-CM | POA: Diagnosis not present

## 2024-10-12 DIAGNOSIS — Z5112 Encounter for antineoplastic immunotherapy: Secondary | ICD-10-CM | POA: Diagnosis not present

## 2024-10-12 DIAGNOSIS — Z853 Personal history of malignant neoplasm of breast: Secondary | ICD-10-CM | POA: Diagnosis not present

## 2024-10-12 DIAGNOSIS — D649 Anemia, unspecified: Secondary | ICD-10-CM | POA: Diagnosis not present

## 2024-10-12 DIAGNOSIS — Z79899 Other long term (current) drug therapy: Secondary | ICD-10-CM | POA: Diagnosis not present

## 2024-10-12 LAB — CMP (CANCER CENTER ONLY)
ALT: 37 U/L (ref 0–44)
AST: 48 U/L — ABNORMAL HIGH (ref 15–41)
Albumin: 3.9 g/dL (ref 3.5–5.0)
Alkaline Phosphatase: 82 U/L (ref 38–126)
Anion gap: 12 (ref 5–15)
BUN: 21 mg/dL (ref 8–23)
CO2: 20 mmol/L — ABNORMAL LOW (ref 22–32)
Calcium: 9.1 mg/dL (ref 8.9–10.3)
Chloride: 105 mmol/L (ref 98–111)
Creatinine: 1.66 mg/dL — ABNORMAL HIGH (ref 0.44–1.00)
GFR, Estimated: 33 mL/min — ABNORMAL LOW (ref >60)
Glucose, Bld: 127 mg/dL — ABNORMAL HIGH (ref 70–99)
Potassium: 4.3 mmol/L (ref 3.5–5.1)
Sodium: 137 mmol/L (ref 135–145)
Total Bilirubin: 0.5 mg/dL (ref 0.0–1.2)
Total Protein: 7.3 g/dL (ref 6.5–8.1)

## 2024-10-12 LAB — CBC WITH DIFFERENTIAL (CANCER CENTER ONLY)
Abs Immature Granulocytes: 0.02 K/uL (ref 0.00–0.07)
Basophils Absolute: 0.1 K/uL (ref 0.0–0.1)
Basophils Relative: 2 %
Eosinophils Absolute: 0.1 K/uL (ref 0.0–0.5)
Eosinophils Relative: 3 %
HCT: 30.3 % — ABNORMAL LOW (ref 36.0–46.0)
Hemoglobin: 10.1 g/dL — ABNORMAL LOW (ref 12.0–15.0)
Immature Granulocytes: 0 %
Lymphocytes Relative: 25 %
Lymphs Abs: 1.1 K/uL (ref 0.7–4.0)
MCH: 34.9 pg — ABNORMAL HIGH (ref 26.0–34.0)
MCHC: 33.3 g/dL (ref 30.0–36.0)
MCV: 104.8 fL — ABNORMAL HIGH (ref 80.0–100.0)
Monocytes Absolute: 0.6 K/uL (ref 0.1–1.0)
Monocytes Relative: 14 %
Neutro Abs: 2.6 K/uL (ref 1.7–7.7)
Neutrophils Relative %: 56 %
Platelet Count: 205 K/uL (ref 150–400)
RBC: 2.89 MIL/uL — ABNORMAL LOW (ref 3.87–5.11)
RDW: 14.9 % (ref 11.5–15.5)
WBC Count: 4.6 K/uL (ref 4.0–10.5)
nRBC: 0 % (ref 0.0–0.2)

## 2024-10-12 MED ORDER — FULVESTRANT 250 MG/5ML IM SOSY
500.0000 mg | PREFILLED_SYRINGE | Freq: Once | INTRAMUSCULAR | Status: AC
  Administered 2024-10-12: 500 mg via INTRAMUSCULAR
  Filled 2024-10-12: qty 10

## 2024-10-12 NOTE — Progress Notes (Signed)
 [facility]  [phone]) [phone]  [phone]     Cassidy Norris DOB: [birth year]  MR#: [id number]  RDW#:[id number]  Patient Care Team: Edman Marsa PARAS, DO as PCP - General (Family Medicine) Darron Deatrice LABOR, MD as PCP - Cardiology (Cardiology) Skeet Juliene SAUNDERS, DO as Consulting Physician (Neurology) Jacobo Evalene PARAS, MD as Consulting Physician (Oncology) Maurie Rayfield BIRCH, RN as Oncology Nurse Navigator Mevelyn BIRCH Bathe, OD (Optometry)  CHIEF COMPLAINT: Recurrent, metastatic ER/PR, HER2 negative invasive carcinoma of the breast.    INTERVAL HISTORY: Patient returns to clinic today for repeat laboratory, further evaluation, and continuation of dose reduced Kisqali  and fulvestrant .  She continues to feel well and remains asymptomatic.  Since dose reducing Kisqali  she no longer complains of any nausea or fatigue.  She denies any pain.  She has no new neurologic complaints.  She denies any recent fevers or illnesses.  She has a fair appetite and denies weight loss.  She has no chest pain, shortness of breath, cough, or hemoptysis.  She denies any nausea, vomiting, constipation, or diarrhea.  She has no urinary complaints.  She offers no specific complaints today.  REVIEW OF SYSTEMS:   Review of Systems  Constitutional: Negative.  Negative for fever, malaise/fatigue and weight loss.  Respiratory: Negative.  Negative for cough, hemoptysis and shortness of breath.   Cardiovascular: Negative.  Negative for chest pain and leg swelling.  Gastrointestinal: Negative.  Negative for abdominal pain, nausea and vomiting.  Genitourinary: Negative.  Negative for dysuria and flank pain.  Musculoskeletal:  Negative for joint pain and myalgias.  Skin: Negative.  Negative for rash.  Neurological: Negative.  Negative for dizziness, sensory change, focal weakness, weakness and headaches.  Psychiatric/Behavioral: Negative.  The patient is not nervous/anxious.     As per  HPI. Otherwise, a complete review of systems is negative.  ONCOLOGY HISTORY: Oncology History Overview Note  1. Bilateral carcinoma of breast, February, 2015. Right breast status post radical mastectomy. T1 C. N0M0 (isolated tumor cells in one lymph node) multifocal invasive cancer. Stage IC, Left breast, Status post radical mastectomy, T2, N1, M0 tumor. Stage II, RIght breast. Both tumors are estrogen receptor positive.  Progesterone receptor positive.  HER-2 receptor negative by FISH. Negative for BRCA mutation.  2. Started on Adriamycin and Cytoxan on March 01, 2014. Last cycle of Cytoxan and Adriamycin on May 01, 2014. Patient has finished last chemotherapy on September 9. (12 th dose was omitted because of neuropathy) 3.  Taking tamoxifen .  Patient had a poor tolerance to letrozole.(October, 2015) 4.  Patient is taking letrozole since November of 2015 5.  May, 2016 Letrozole has been put on hold because of bony pains 6.  Started on Aromasin  from July of 2016    History of bilateral breast cancer (Resolved)  11/29/2013 Initial Diagnosis   Breast cancer bilateral, multifocal ER/PR pos, Her 2 neg,.     PAST MEDICAL HISTORY: Past Medical History:  Diagnosis Date   Actinic keratosis    Anemia    Arthritis not really diagnosis but have pain   B12 deficiency 07/23/2016   Will check levels to determine if injections are needed again. Await results. Recent CBC at Onc WNL   Blood transfusion without reported diagnosis 1983 had a miscarriage/dnc   Cancer (HCC) 12/30/2013   Multifocal disease: pT1c,m, N0(isolated tumor cells) Er/ PR positive, Her 2 negative.   Cancer (HCC) 01/09/2014   Invasive lobular carcinoma, 2.2 cm, T2,N1 ER/PR positive, HER-2/neu negative  Cataract 2014?   Centrilobular emphysema (HCC) 10/27/2021   CKD (chronic kidney disease), stage IV (HCC) 07/13/2023   Complication of anesthesia 03/06/2022   Prolonged metabolism of Rocuronium  during case.   Diabetes mellitus  without complication (HCC)    Diffuse cystic mastopathy    Erosive esophagitis    Essential hypertension, benign 09/24/2017   Family history of malignant neoplasm of gastrointestinal tract 2012   Fractured elbow 08/28/2014   Gastroesophageal reflux disease    Hx of metabolic acidosis with increased anion gap    Increased heart rate    Multiple sclerosis    Neuromuscular disorder (HCC)    neuropathy in bil feet - left is worse.   Obesity, unspecified    Peripheral neuropathy    Personal history of tobacco use, presenting hazards to health    Restless leg syndrome    Sleep apnea    uses CPAP   Special screening for malignant neoplasms, colon    Squamous cell carcinoma of skin 08/24/2022   SCCIS - scalp, ED&C   Squamous cell carcinoma of skin 07/20/2023   SCC IS right of midline ant scalp post, tx with ED&C   Squamous cell carcinoma of skin 07/20/2023   SCC IS right of midline ant scalp ant, tx with ED&C    PAST SURGICAL HISTORY: Past Surgical History:  Procedure Laterality Date   BREAST BIOPSY Right 1973,2001   BREAST BIOPSY Right 11/07/2013   INVASIVE MAMMARY CARCINOMA , ER/PR positive Her2 negative   BREAST BIOPSY Left 11/27/2013   invasive lobular and DCIS   BREAST SURGERY Bilateral 01/09/2014   mastectomy   cataract surgery Left 08/13/2022   CHOLECYSTECTOMY     COLONOSCOPY  2010   Dr. Dellie   COLONOSCOPY WITH PROPOFOL  N/A 06/11/2015   Procedure: COLONOSCOPY WITH PROPOFOL ;  Surgeon: Louanne KANDICE Dellie, MD;  Location: [facility] ENDOSCOPY;  Service: Endoscopy;  Laterality: N/A;   COLONOSCOPY WITH PROPOFOL  N/A 12/23/2021   Procedure: COLONOSCOPY WITH PROPOFOL ;  Surgeon: Unk Corinn Skiff, MD;  Location: [facility] ENDOSCOPY;  Service: Gastroenterology;  Laterality: N/A;   DILATATION & CURETTAGE/HYSTEROSCOPY WITH MYOSURE N/A 07/12/2018   Procedure: DILATATION & CURETTAGE/HYSTEROSCOPY WITH MYOSURE WITH ENDOMETRIAL POLYPECTOMY;  Surgeon: Leonce Garnette BIRCH, MD;  Location: [facility]  ORS;  Service: Gynecology;  Laterality: N/A;   DILATION AND CURETTAGE OF UTERUS  1983   ESOPHAGOGASTRODUODENOSCOPY N/A 12/23/2021   Procedure: ESOPHAGOGASTRODUODENOSCOPY (EGD);  Surgeon: Unk Corinn Skiff, MD;  Location: [facility] ENDOSCOPY;  Service: Gastroenterology;  Laterality: N/A;   JOINT REPLACEMENT Left 11/23/2015   right  2023   KNEE ARTHROPLASTY Right 03/06/2022   Procedure: COMPUTER ASSISTED TOTAL KNEE ARTHROPLASTY;  Surgeon: Mardee Lynwood SQUIBB, MD;  Location: [facility] ORS;  Service: Orthopedics;  Laterality: Right;   LAPAROSCOPIC BILATERAL SALPINGO OOPHERECTOMY N/A 08/04/2023   Procedure: LAPAROSCOPIC BILATERAL SALPINGO OOPHORECTOMY;  Surgeon: Mancil Barter, MD;  Location: [facility] ORS;  Service: Gynecology;  Laterality: N/A;   POLYPECTOMY  1989   PORTA CATH REMOVAL     PORTACATH PLACEMENT  2015   SKIN BIOPSY     on scalp   SPINE SURGERY  09/14/2014   Ruptured disk L4- L5   TOTAL KNEE ARTHROPLASTY Left 11/22/2015   Procedure: TOTAL KNEE ARTHROPLASTY;  Surgeon: Toribio Silos, MD;  Location: [name] OR;  Service: Orthopedics;  Laterality: Left;   TUBAL LIGATION     WISDOM TOOTH EXTRACTION  2006    FAMILY HISTORY Family History  Problem Relation Age of Onset   Lung cancer Mother 40   COPD  Mother    Colon cancer Father 38   Stomach cancer Father 75   Hypertension Sister    Breast cancer Maternal Aunt        dx over 12   Breast cancer Paternal Aunt        dx 10s   Breast cancer Paternal Aunt        dx 11s   Rectal cancer Paternal Uncle        dx 64s   Rectal cancer Paternal Uncle        dx 35s   Diabetes Maternal Grandmother    Pancreatic cancer Paternal Grandfather    Rectal cancer Cousin        dx 48s-60s   Rectal cancer Cousin        dx >50    GYNECOLOGIC HISTORY:  Patient's last menstrual period was 12/22/1999 (approximate).     ADVANCED DIRECTIVES:    HEALTH MAINTENANCE: Social History   Tobacco Use   Smoking status: Former    Current packs/day: 0.00     Average packs/day: 1 pack/day for 30.0 years (30.0 ttl pk-yrs)    Types: Cigarettes    Start date: 12/12/1983    Quit date: 12/11/2013    Years since quitting: 10.8   Smokeless tobacco: Former  Building Services Engineer status: Never Used  Substance Use Topics   Alcohol use: Not Currently   Drug use: No     Colonoscopy:  PAP:  Bone density:  Mammogram:  No Known Allergies  Current Outpatient Medications  Medication Sig Dispense Refill   acetaminophen  (TYLENOL ) 500 MG tablet Take 1,000 mg by mouth 2 (two) times daily as needed for moderate pain or headache.     baclofen  (LIORESAL ) 10 MG tablet TAKE 1 TABLET BY MOUTH THREE TIMES A DAY AS NEEDED FOR MUSCLE SPASM 270 tablet 1   calcium  carbonate (OSCAL) 1500 (600 Ca) MG TABS tablet Take by mouth 2 (two) times daily with a meal.     dapagliflozin propanediol (FARXIGA) 10 MG TABS tablet Take 10 mg by mouth daily.     diclofenac  Sodium (VOLTAREN ) 1 % GEL Apply 2 g topically 3 (three) times daily as needed. 100 g 2   ezetimibe  (ZETIA ) 10 MG tablet Take 1 tablet (10 mg total) by mouth daily. 90 tablet 3   fluticasone  (FLONASE ) 50 MCG/ACT nasal spray PLACE 2 SPRAYS INTO BOTH NOSTRILS AT BEDTIME. 48 g 1   fulvestrant  (FASLODEX ) 250 MG/5ML injection Inject 500 mg into the muscle every 30 (thirty) days. One injection each buttock over 1-2 minutes. Warm prior to use.     Krill Oil 1000 MG CAPS Take 1,000 mg by mouth daily.     lisinopril  (ZESTRIL ) 5 MG tablet TAKE 1 TABLET (5 MG TOTAL) BY MOUTH DAILY. 90 tablet 0   loperamide  (IMODIUM ) 2 MG capsule TAKE 2 TABLETS (4 MG TOTAL) BY MOUTH 4 (FOUR) TIMES DAILY AS NEEDED FOR DIARRHEA OR LOOSE STOOLS. 30 capsule 1   metFORMIN  (GLUCOPHAGE ) 500 MG tablet TAKE 1 TABLET BY MOUTH 2 TIMES DAILY WITH A MEAL. 180 tablet 1   metoprolol  tartrate (LOPRESSOR ) 25 MG tablet TAKE 1/2 TABLET TWICE A DAY BY MOUTH 90 tablet 3   omeprazole  (PRILOSEC) 40 MG capsule TAKE 1 CAPSULE BY MOUTH TWICE A DAY 180 capsule 1   ondansetron   (ZOFRAN ) 8 MG tablet TAKE 1 TABLET BY MOUTH EVERY 8 HOURS AS NEEDED FOR NAUSEA OR VOMITING. 20 tablet 0   prochlorperazine  (COMPAZINE ) 10 MG tablet  Take 1 tablet (10 mg total) by mouth every 6 (six) hours as needed for nausea or vomiting. 20 tablet 1   ribociclib  succ (KISQALI  400MG  DAILY DOSE) 200 MG Therapy Pack Take 2 tablets (400 mg total) by mouth daily. Take for 21 days on, 7 days off, repeat every 28 days. 42 tablet 1   sodium chloride  (OCEAN) 0.65 % SOLN nasal spray Place 1 spray into both nostrils as needed for congestion.     traZODone  (DESYREL ) 50 MG tablet TAKE 1 TABLET (50 MG TOTAL) BY MOUTH AT BEDTIME AS NEEDED. FOR SLEEP 90 tablet 1   venlafaxine  XR (EFFEXOR -XR) 75 MG 24 hr capsule Take 1 capsule (75 mg total) by mouth daily with breakfast. 90 capsule 3   vitamin B-12 (CYANOCOBALAMIN ) 1000 MCG tablet Take 2,500 mcg by mouth every Monday, Wednesday, and Friday.     Vitamin D , Cholecalciferol , 50 MCG (2000 UT) CAPS Take 2,000 Units by mouth every Monday, Wednesday, and Friday.     No current facility-administered medications for this visit.   Facility-Administered Medications Ordered in Other Visits  Medication Dose Route Frequency Provider Last Rate Last Admin   fulvestrant  (FASLODEX ) injection 500 mg  500 mg Intramuscular Once Jacobo Evalene PARAS, MD        OBJECTIVE: BP 122/82 (BP Location: Left Arm, Patient Position: Sitting, Cuff Size: Normal)   Pulse 72   Temp (!) 97.1 F (36.2 C) (Tympanic)   Resp 18   Ht 5' 5 (1.651 m)   Wt 199 lb (90.3 kg)   LMP 12/22/1999 (Approximate)   SpO2 97%   BMI 33.12 kg/m    Body mass index is 33.12 kg/m.    ECOG FS:0 - Asymptomatic  General: Well-developed, well-nourished, no acute distress. Eyes: Pink conjunctiva, anicteric sclera. HEENT: Normocephalic, moist mucous membranes. Lungs: No audible wheezing or coughing. Heart: Regular rate and rhythm. Abdomen: Soft, nontender, no obvious distention. Musculoskeletal: No edema,  cyanosis, or clubbing. Neuro: Alert, answering all questions appropriately. Cranial nerves grossly intact. Skin: No rashes or petechiae noted. Psych: Normal affect.  LAB RESULTS:  Appointment on 10/12/2024  Component Date Value Ref Range Status   WBC Count 10/12/2024 4.6  4.0 - 10.5 K/uL Final   RBC 10/12/2024 2.89 (L)  3.87 - 5.11 MIL/uL Final   Hemoglobin 10/12/2024 10.1 (L)  12.0 - 15.0 g/dL Final   HCT 88/79/7974 30.3 (L)  36.0 - 46.0 % Final   MCV 10/12/2024 104.8 (H)  80.0 - 100.0 fL Final   MCH 10/12/2024 34.9 (H)  26.0 - 34.0 pg Final   MCHC 10/12/2024 33.3  30.0 - 36.0 g/dL Final   RDW 88/79/7974 14.9  11.5 - 15.5 % Final   Platelet Count 10/12/2024 205  150 - 400 K/uL Final   nRBC 10/12/2024 0.0  0.0 - 0.2 % Final   Neutrophils Relative % 10/12/2024 56  % Final   Neutro Abs 10/12/2024 2.6  1.7 - 7.7 K/uL Final   Lymphocytes Relative 10/12/2024 25  % Final   Lymphs Abs 10/12/2024 1.1  0.7 - 4.0 K/uL Final   Monocytes Relative 10/12/2024 14  % Final   Monocytes Absolute 10/12/2024 0.6  0.1 - 1.0 K/uL Final   Eosinophils Relative 10/12/2024 3  % Final   Eosinophils Absolute 10/12/2024 0.1  0.0 - 0.5 K/uL Final   Basophils Relative 10/12/2024 2  % Final   Basophils Absolute 10/12/2024 0.1  0.0 - 0.1 K/uL Final   Immature Granulocytes 10/12/2024 0  % Final  Abs Immature Granulocytes 10/12/2024 0.02  0.00 - 0.07 K/uL Final   Performed at [facility], [address] [place], [place] [address]   Sodium 10/12/2024 137  135 - 145 mmol/L Final   Potassium 10/12/2024 4.3  3.5 - 5.1 mmol/L Final   Chloride 10/12/2024 105  98 - 111 mmol/L Final   CO2 10/12/2024 20 (L)  22 - 32 mmol/L Final   Glucose, Bld 10/12/2024 127 (H)  70 - 99 mg/dL Final   Glucose reference range applies only to samples taken after fasting for at least 8 hours.   BUN 10/12/2024 21  8 - 23 mg/dL Final   Creatinine 88/79/7974 1.66 (H)  0.44 - 1.00 mg/dL Final   Calcium  10/12/2024 9.1  8.9 - 10.3  mg/dL Final   Total Protein 88/79/7974 7.3  6.5 - 8.1 g/dL Final   Albumin 88/79/7974 3.9  3.5 - 5.0 g/dL Final   AST 88/79/7974 48 (H)  15 - 41 U/L Final   ALT 10/12/2024 37  0 - 44 U/L Final   Alkaline Phosphatase 10/12/2024 82  38 - 126 U/L Final   Total Bilirubin 10/12/2024 0.5  0.0 - 1.2 mg/dL Final   GFR, Estimated 10/12/2024 33 (L)  >60 mL/min Final   Comment: (NOTE) Calculated using the CKD-EPI Creatinine Equation (2021)    Anion gap 10/12/2024 12  5 - 15 Final   Performed at [facility], [address] [place], [place] [address]    STUDIES: No results found.  ONCOLOGY HISTORY:  Bilateral adenocarcinoma of the breast. Patient is status post bilateral mastectomy in February 2015. She also received adjuvant chemotherapy with Adriamycin, Cytoxan, and Taxol completing treatment in approximately October 2015. She then initiated letrozole, but could not tolerate secondary to leg pain and was switched to Aromasin .  Patient then switched to tamoxifen  in October 2019 secondary to cost.  Patient subsequently discontinued tamoxifen  and did not complete the recommended 5 years of treatment.  PET scan results from December 26, 2021 reviewed independently with hypermetabolic left supraclavicular, subpectoral, and axillary lymphadenopathy consistent with nodal recurrence of patient's history of breast cancer.  No distant metastases were identified.  Biopsy confirmed recurrent disease.    ASSESSMENT: Recurrent, metastatic ER/PR, HER2 negative invasive carcinoma of the breast.   PLAN:    Recurrent, metastatic ER/PR, HER2 negative invasive carcinoma of the breast: See oncology history as above. Her most recent PET scan on August 02, 2024 reviewed independently with no evidence of active malignancy.  Her initial CA 27-29 initially was 260.1.  Now seems to have plateaued between 60.9 and 98.5.  Although her most recent result has trended down to 44.7.  Of note, patient also had metastatic  breast cancer in her surgical specimen from her oophorectomy.  Continue dose reduced Kisqali  400 mg 21 days with 7 days off.  Proceed with fulvestrant  today.  Return to clinic in 4 weeks for laboratory work and evaluation by clinical pharmacy.  Patient will then return to clinic in 8 weeks for laboratory work, further evaluation, and continuation of treatment.   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  Follow-up with gynecology oncology as needed. Multiple sclerosis: Chronic and unchanged.  Continue evaluation and treatment per neurology.  Recent MRI of the brain did not reveal any new lesions. Renal insufficiency: Chronic and unchanged.  Patient's GFR is 33 today.  Dose reduce Kisqali  as above.  Monitor if GFR remains persistently less than 30, may need to adjust  Kisqali  dosing. Anemia: Chronic and unchanged.  Patient's hemoglobin is 10.1 today. Macrocytosis: Likely secondary to underlying treatments. Reflux: Patient does not complain of this today.  Continue omeprazole .   Nausea and vomiting: Patient does not complain of this today.  Continue ondansetron  as needed.  Patient expressed understanding and was in agreement with this plan. She also understands that She can call clinic at any time with any questions, concerns, or complaints.   Breast cancer bilateral, multifocal ER/PR pos, Her 2 neg.   Staging form: Breast, AJCC 7th Edition     Clinical: Stage IIB (T2, N1, M0) - Signed by Vester Clayman, MD on 04/22/2015   Evalene JINNY Reusing, MD   10/12/2024 10:08 AM

## 2024-10-12 NOTE — Progress Notes (Signed)
Patient is doing ok.

## 2024-10-13 ENCOUNTER — Encounter: Payer: Self-pay | Admitting: Oncology

## 2024-10-13 LAB — CANCER ANTIGEN 27.29: CA 27.29: 44.7 U/mL — ABNORMAL HIGH (ref 0.0–38.6)

## 2024-10-24 ENCOUNTER — Other Ambulatory Visit (HOSPITAL_COMMUNITY): Payer: Self-pay

## 2024-10-24 ENCOUNTER — Other Ambulatory Visit: Payer: Self-pay | Admitting: Oncology

## 2024-10-24 ENCOUNTER — Other Ambulatory Visit: Payer: Self-pay

## 2024-10-24 DIAGNOSIS — C50919 Malignant neoplasm of unspecified site of unspecified female breast: Secondary | ICD-10-CM

## 2024-10-24 MED ORDER — RIBOCICLIB SUCC (400 MG DOSE) 200 MG PO TBPK
400.0000 mg | ORAL_TABLET | Freq: Every day | ORAL | 1 refills | Status: DC
  Filled 2024-10-24: qty 42, 21d supply, fill #0
  Filled 2024-10-25: qty 42, 28d supply, fill #0
  Filled 2024-11-27: qty 42, 28d supply, fill #1

## 2024-10-25 ENCOUNTER — Other Ambulatory Visit: Payer: Self-pay | Admitting: Pharmacy Technician

## 2024-10-25 ENCOUNTER — Other Ambulatory Visit: Payer: Self-pay

## 2024-10-25 ENCOUNTER — Other Ambulatory Visit (HOSPITAL_COMMUNITY): Payer: Self-pay

## 2024-10-25 NOTE — Progress Notes (Signed)
 Specialty Pharmacy Refill Coordination Note  MARGEART ALLENDER is a 73 y.o. female contacted today regarding refills of specialty medication(s) Ribociclib  Succinate (KISQALI  400mg  Daily Dose)   Patient requested (Patient-Rptd) Delivery   Delivery date: 10/31/2024  Verified address: (Patient-Rptd) [address] [place] [address]   Medication will be filled on: 10/30/2024

## 2024-10-26 ENCOUNTER — Other Ambulatory Visit: Payer: Self-pay | Admitting: Family Medicine

## 2024-10-26 ENCOUNTER — Other Ambulatory Visit: Payer: Self-pay | Admitting: Oncology

## 2024-10-26 DIAGNOSIS — J0110 Acute frontal sinusitis, unspecified: Secondary | ICD-10-CM

## 2024-10-26 DIAGNOSIS — J432 Centrilobular emphysema: Secondary | ICD-10-CM

## 2024-10-26 DIAGNOSIS — E1169 Type 2 diabetes mellitus with other specified complication: Secondary | ICD-10-CM

## 2024-10-26 DIAGNOSIS — C50919 Malignant neoplasm of unspecified site of unspecified female breast: Secondary | ICD-10-CM

## 2024-10-29 ENCOUNTER — Encounter: Payer: Self-pay | Admitting: Family Medicine

## 2024-10-30 ENCOUNTER — Other Ambulatory Visit: Payer: Self-pay

## 2024-10-30 ENCOUNTER — Encounter: Payer: Self-pay | Admitting: Oncology

## 2024-10-30 DIAGNOSIS — E1169 Type 2 diabetes mellitus with other specified complication: Secondary | ICD-10-CM

## 2024-10-30 MED ORDER — METFORMIN HCL 500 MG PO TABS: 500.0000 mg | ORAL_TABLET | Freq: Two times a day (BID) | ORAL | 1 refills | Status: DC

## 2024-10-30 NOTE — Telephone Encounter (Signed)
 Prednisone  and Amoxicillin  discontinued 01/07/24, completed course. Metformin  refilled 10/30/24.  Requested Prescriptions  Pending Prescriptions Disp Refills   amoxicillin -clavulanate (AUGMENTIN ) 875-125 MG tablet [Pharmacy Med Name: AMOXICILLIN -CLAV 875-125MG  TAB] 20 tablet 0    Sig: TAKE 1 TABLET BY MOUTH TWICE A DAY     Off-Protocol Failed - 10/30/2024  8:26 AM      Failed - Medication not assigned to a protocol, review manually.      Passed - Valid encounter within last 12 months    Recent Outpatient Visits           6 months ago Type 2 diabetes mellitus with other specified complication, without long-term current use of insulin  [facility])   [place] [facility] Edman Marsa PARAS, DO   10 months ago Centrilobular emphysema [facility])   [place] [facility] Edman Marsa PARAS, DO       Future Appointments             In 10 months Hester Alm BROCKS, MD [facility]             predniSONE  (DELTASONE ) 20 MG tablet [Pharmacy Med Name: PREDNISONE  20 MG TABLET] 13 tablet 0    Sig: TAKE DAILY WITH FOOD. START WITH 60MG  (3 PILLS) X 2 DAYS, THEN REDUCE TO 40MG  (2 PILLS) X 2 DAYS, THEN 20MG  (1 PILL) X 3 DAYS     Not Delegated - Endocrinology:  Oral Corticosteroids Failed - 10/30/2024  8:26 AM      Failed - This refill cannot be delegated      Failed - Manual Review: Eye exam for IOP if prolonged treatment      Failed - Glucose (serum) in normal range and within 180 days    Glucose  Date Value Ref Range Status  03/11/2015 118 (H) mg/dL Final    Comment:    34-00 NOTE: New Reference Range  01/29/15    Glucose, Bld  Date Value Ref Range Status  10/12/2024 127 (H) 70 - 99 mg/dL Final    Comment:    Glucose reference range applies only to samples taken after fasting for at least 8 hours.   Glucose-Capillary  Date Value Ref Range Status  08/02/2024 134 (H) 70 - 99 mg/dL Final    Comment:    Glucose reference  range applies only to samples taken after fasting for at least 8 hours.         Failed - Bone Mineral Density or Dexa Scan completed in the last 2 years      Passed - K in normal range and within 180 days    Potassium  Date Value Ref Range Status  10/12/2024 4.3 3.5 - 5.1 mmol/L Final  03/11/2015 3.4 (L) mmol/L Final    Comment:    3.5-5.1 NOTE: New Reference Range  01/29/15          Passed - Na in normal range and within 180 days    Sodium  Date Value Ref Range Status  10/12/2024 137 135 - 145 mmol/L Final  07/14/2019 142 134 - 144 mmol/L Final  03/11/2015 139 mmol/L Final    Comment:    135-145 NOTE: New Reference Range  01/29/15          Passed - Last BP in normal range    BP Readings from Last 1 Encounters:  10/12/24 122/82         Passed - Valid encounter within last 6 months  Recent Outpatient Visits           6 months ago Type 2 diabetes mellitus with other specified complication, without long-term current use of insulin  [facility])   [place] [facility] [place], Marsa PARAS, DO   10 months ago Centrilobular emphysema [facility])   [place] [facility] Edman Marsa PARAS, DO       Future Appointments             In 10 months Hester Alm BROCKS, MD [facility]             metFORMIN  (GLUCOPHAGE ) 500 MG tablet [Pharmacy Med Name: METFORMIN  HCL 500 MG TABLET] 180 tablet 1    Sig: TAKE 1 TABLET BY MOUTH TWICE A DAY WITH FOOD     Endocrinology:  Diabetes - Biguanides Failed - 10/30/2024  8:26 AM      Failed - Cr in normal range and within 360 days    Creatinine  Date Value Ref Range Status  10/12/2024 1.66 (H) 0.44 - 1.00 mg/dL Final   Creat  Date Value Ref Range Status  10/20/2021 0.85 0.50 - 1.05 mg/dL Final   Creatinine, Urine  Date Value Ref Range Status  06/21/2023 74  Final         Failed - HBA1C is between 0 and 7.9 and within 180 days    Hemoglobin A1C  Date Value Ref Range  Status  05/02/2024 6.0 (A) 4.0 - 5.6 % Final   HB A1C (BAYER DCA - WAIVED)  Date Value Ref Range Status  07/13/2017 6.5 <7.0 % Final    Comment:                                          Diabetic Adult            <7.0                                       Healthy Adult        4.3 - 5.7                                                           (DCCT/NGSP) American Diabetes Association's Summary of Glycemic Recommendations for Adults with Diabetes: Hemoglobin A1c <7.0%. More stringent glycemic goals (A1c <6.0%) may further reduce complications at the cost of increased risk of hypoglycemia.    Hgb A1c MFr Bld  Date Value Ref Range Status  10/25/2023 6.0 (H) <5.7 % of total Hgb Final    Comment:    For someone without known diabetes, a hemoglobin  A1c value between 5.7% and 6.4% is consistent with prediabetes and should be confirmed with a  follow-up test. . For someone with known diabetes, a value <7% indicates that their diabetes is well controlled. A1c targets should be individualized based on duration of diabetes, age, comorbid conditions, and other considerations. . This assay result is consistent with an increased risk of diabetes. . Currently, no consensus exists regarding use of hemoglobin A1c for diagnosis of diabetes for children. [name]  Failed - eGFR in normal range and within 360 days    GFR, Est African American  Date Value Ref Range Status  10/15/2020 72 > OR = 60 mL/min/1.18m2 Final   GFR, Est Non African American  Date Value Ref Range Status  10/15/2020 62 > OR = 60 mL/min/1.62m2 Final   GFR, Estimated  Date Value Ref Range Status  10/12/2024 33 (L) >60 mL/min Final    Comment:    (NOTE) Calculated using the CKD-EPI Creatinine Equation (2021)    GFR  Date Value Ref Range Status  12/28/2019 62.40 >60.00 mL/min Final   eGFR  Date Value Ref Range Status  10/20/2021 74 > OR = 60 mL/min/1.36m2 Final    Comment:    The eGFR is based on the  CKD-EPI 2021 equation. To calculate  the new eGFR from a previous Creatinine or Cystatin C result, go to https://www.kidney.org/professionals/ kdoqi/gfr%5Fcalculator          Failed - B12 Level in normal range and within 720 days    Vitamin B-12  Date Value Ref Range Status  10/20/2021 >2,000 (H) 200 - 1,100 pg/mL Final         Passed - Valid encounter within last 6 months    Recent Outpatient Visits           6 months ago Type 2 diabetes mellitus with other specified complication, without long-term current use of insulin  [facility])   [place] [facility] Edman Marsa PARAS, DO   10 months ago Centrilobular emphysema [facility])   [place] [facility] Edman Marsa PARAS, DO       Future Appointments             In 10 months Hester Alm BROCKS, MD [facility]            Passed - CBC within normal limits and completed in the last 12 months    WBC  Date Value Ref Range Status  03/30/2024 4.0 4.0 - 10.5 K/uL Final   WBC Count  Date Value Ref Range Status  10/12/2024 4.6 4.0 - 10.5 K/uL Final   RBC  Date Value Ref Range Status  10/12/2024 2.89 (L) 3.87 - 5.11 MIL/uL Final   Hemoglobin  Date Value Ref Range Status  10/12/2024 10.1 (L) 12.0 - 15.0 g/dL Final  91/78/7981 84.4 11.1 - 15.9 g/dL Final   HCT  Date Value Ref Range Status  10/12/2024 30.3 (L) 36.0 - 46.0 % Final   Hematocrit  Date Value Ref Range Status  07/13/2017 46.7 (H) 34.0 - 46.6 % Final   MCHC  Date Value Ref Range Status  10/12/2024 33.3 30.0 - 36.0 g/dL Final   [facility]  Date Value Ref Range Status  10/12/2024 34.9 (H) 26.0 - 34.0 pg Final   MCV  Date Value Ref Range Status  10/12/2024 104.8 (H) 80.0 - 100.0 fL Final  07/13/2017 94 79 - 97 fL Final  03/11/2015 93 80 - 100 fL Final   No results found for: PLTCOUNTKUC, LABPLAT, POCPLA RDW  Date Value Ref Range Status  10/12/2024 14.9 11.5 - 15.5 % Final  07/13/2017 13.1  12.3 - 15.4 % Final  03/11/2015 13.8 11.5 - 14.5 % Final

## 2024-10-31 DIAGNOSIS — N189 Chronic kidney disease, unspecified: Secondary | ICD-10-CM | POA: Diagnosis not present

## 2024-10-31 DIAGNOSIS — I129 Hypertensive chronic kidney disease with stage 1 through stage 4 chronic kidney disease, or unspecified chronic kidney disease: Secondary | ICD-10-CM | POA: Diagnosis not present

## 2024-10-31 DIAGNOSIS — N1832 Chronic kidney disease, stage 3b: Secondary | ICD-10-CM | POA: Diagnosis not present

## 2024-10-31 DIAGNOSIS — R809 Proteinuria, unspecified: Secondary | ICD-10-CM | POA: Diagnosis not present

## 2024-11-01 ENCOUNTER — Other Ambulatory Visit

## 2024-11-01 DIAGNOSIS — J432 Centrilobular emphysema: Secondary | ICD-10-CM

## 2024-11-01 DIAGNOSIS — Z0000 Encounter for general adult medical examination without abnormal findings: Secondary | ICD-10-CM | POA: Diagnosis not present

## 2024-11-01 DIAGNOSIS — E559 Vitamin D deficiency, unspecified: Secondary | ICD-10-CM | POA: Diagnosis not present

## 2024-11-01 DIAGNOSIS — I700 Atherosclerosis of aorta: Secondary | ICD-10-CM | POA: Diagnosis not present

## 2024-11-01 DIAGNOSIS — E1169 Type 2 diabetes mellitus with other specified complication: Secondary | ICD-10-CM | POA: Diagnosis not present

## 2024-11-01 DIAGNOSIS — Z8639 Personal history of other endocrine, nutritional and metabolic disease: Secondary | ICD-10-CM

## 2024-11-01 DIAGNOSIS — E785 Hyperlipidemia, unspecified: Secondary | ICD-10-CM | POA: Diagnosis not present

## 2024-11-02 LAB — COMPREHENSIVE METABOLIC PANEL WITH GFR
AG Ratio: 1.5 (calc) (ref 1.0–2.5)
ALT: 50 U/L — ABNORMAL HIGH (ref 6–29)
AST: 66 U/L — ABNORMAL HIGH (ref 10–35)
Albumin: 4.2 g/dL (ref 3.6–5.1)
Alkaline phosphatase (APISO): 90 U/L (ref 37–153)
BUN/Creatinine Ratio: 9 (calc) (ref 6–22)
BUN: 18 mg/dL (ref 7–25)
CO2: 27 mmol/L (ref 20–32)
Calcium: 9.4 mg/dL (ref 8.6–10.4)
Chloride: 102 mmol/L (ref 98–110)
Creat: 1.96 mg/dL — ABNORMAL HIGH (ref 0.60–1.00)
Globulin: 2.8 g/dL (ref 1.9–3.7)
Glucose, Bld: 122 mg/dL — ABNORMAL HIGH (ref 65–99)
Potassium: 5.3 mmol/L (ref 3.5–5.3)
Sodium: 141 mmol/L (ref 135–146)
Total Bilirubin: 0.4 mg/dL (ref 0.2–1.2)
Total Protein: 7 g/dL (ref 6.1–8.1)
eGFR: 27 mL/min/1.73m2 — ABNORMAL LOW (ref >=60)

## 2024-11-02 LAB — CBC WITH DIFFERENTIAL/PLATELET
Absolute Lymphocytes: 778 {cells}/uL — ABNORMAL LOW (ref 850–3900)
Absolute Monocytes: 230 {cells}/uL (ref 200–950)
Basophils Absolute: 60 {cells}/uL (ref 0–200)
Basophils Relative: 2.4 %
Eosinophils Absolute: 120 {cells}/uL (ref 15–500)
Eosinophils Relative: 4.8 %
HCT: 30.8 % — ABNORMAL LOW (ref 35.9–46.0)
Hemoglobin: 10.1 g/dL — ABNORMAL LOW (ref 11.7–15.5)
MCH: 34.5 pg — ABNORMAL HIGH (ref 27.0–33.0)
MCHC: 32.8 g/dL (ref 31.6–35.4)
MCV: 105.1 fL — ABNORMAL HIGH (ref 81.4–101.7)
MPV: 9.9 fL (ref 7.5–12.5)
Monocytes Relative: 9.2 %
Neutro Abs: 1313 {cells}/uL — ABNORMAL LOW (ref 1500–7800)
Neutrophils Relative %: 52.5 %
Platelets: 194 Thousand/uL (ref 140–400)
RBC: 2.93 Million/uL — ABNORMAL LOW (ref 3.80–5.10)
RDW: 14.7 % (ref 11.0–15.0)
Total Lymphocyte: 31.1 %
WBC: 2.5 Thousand/uL — ABNORMAL LOW (ref 3.8–10.8)

## 2024-11-02 LAB — LIPID PANEL
Cholesterol: 189 mg/dL (ref <200)
HDL: 46 mg/dL — ABNORMAL LOW (ref >=50)
Non-HDL Cholesterol (Calc): 143 mg/dL — ABNORMAL HIGH (ref <130)
Total CHOL/HDL Ratio: 4.1 (calc) (ref <5.0)
Triglycerides: 477 mg/dL — ABNORMAL HIGH (ref <150)

## 2024-11-02 LAB — VITAMIN B12: Vitamin B-12: >2000 pg/mL — ABNORMAL HIGH (ref 200–1100)

## 2024-11-02 LAB — MICROALBUMIN / CREATININE URINE RATIO
Creatinine, Urine: 105 mg/dL (ref 20–275)
Microalb Creat Ratio: 21 mg/g{creat} (ref <30)
Microalb, Ur: 2.2 mg/dL

## 2024-11-02 LAB — HEMOGLOBIN A1C
Hgb A1c MFr Bld: 6.2 % — ABNORMAL HIGH (ref <5.7)
Mean Plasma Glucose: 131 mg/dL
eAG (mmol/L): 7.3 mmol/L

## 2024-11-02 LAB — VITAMIN D 25 HYDROXY (VIT D DEFICIENCY, FRACTURES): Vit D, 25-Hydroxy: 63 ng/mL (ref 30–100)

## 2024-11-02 LAB — TSH: TSH: 0.98 m[IU]/L (ref 0.40–4.50)

## 2024-11-07 ENCOUNTER — Ambulatory Visit: Payer: Self-pay

## 2024-11-07 ENCOUNTER — Ambulatory Visit: Payer: Self-pay | Admitting: Family Medicine

## 2024-11-07 DIAGNOSIS — J0110 Acute frontal sinusitis, unspecified: Secondary | ICD-10-CM

## 2024-11-07 DIAGNOSIS — J432 Centrilobular emphysema: Secondary | ICD-10-CM

## 2024-11-07 NOTE — Telephone Encounter (Signed)
 FYI Only or Action Required?: FYI only for provider: appointment scheduled on 12/18.  Patient was last seen in primary care on 05/02/2024 by Edman Marsa PARAS, DO.  Called Nurse Triage reporting Cough.  Symptoms began several days ago.  Interventions attempted: OTC medications: day/nyquil.  Symptoms are: gradually worsening.  Triage Disposition: See HCP Within 4 Hours (Or PCP Triage)  Patient/caregiver understands and will follow disposition?: Yes  Message from Tiffini S sent at 11/07/2024  8:27 AM EST  Reason for Triage: Patient have a cold that started 11/03/24 afternoon- coughing, congestion- taking DayQuil and Ny Quil. OTC medication haven't helped that much- offered next available appointment for pcp and other providers.  Please call the patient back at [phone]   Reason for Disposition  Wheezing is present  Answer Assessment - Initial Assessment Questions Starting Thurs- cough nasal congestion, runny nose. Denies SOB, CP, Dizziness, or fever. Endorses wheezing when laying down or when first getting hooked up to her CPAP.   Mild- moderate cough worse with talkking and activity  Day and nyquil- mild relief of cough  Cancer MD Thurs. Appt with PCP office after to assess. Understands ED/UC precautions.   1. ONSET: When did the cough begin?      Thursday 2. SEVERITY: How bad is the cough today?      Mild-moderate cough  3. SPUTUM: Describe the color of your sputum (e.g., none, dry cough; clear, white, yellow, green)     Clear white 4. HEMOPTYSIS: Are you coughing up any blood? If Yes, ask: How much? (e.g., flecks, streaks, tablespoons, etc.)     denies 5. DIFFICULTY BREATHING: Are you having difficulty breathing? If Yes, ask: How bad is it? (e.g., mild, moderate, severe)      deniese 6. FEVER: Do you have a fever? If Yes, ask: What is your temperature, how was it measured, and when did it start?     denies 7. CARDIAC HISTORY: Do you have any  history of heart disease? (e.g., heart attack, congestive heart failure)      HTN  8. LUNG HISTORY: Do you have any history of lung disease?  (e.g., pulmonary embolus, asthma, emphysema)     Emphysema and OSA 9. PE RISK FACTORS: Do you have a history of blood clots? (or: recent major surgery, recent prolonged travel, bedridden)     Denies 10. OTHER SYMPTOMS: Do you have any other symptoms? (e.g., runny nose, wheezing, chest pain)       Wheezing when first laying down  12. TRAVEL: Have you traveled out of the country in the last month? (e.g., travel history, exposures)       denies  Protocols used: Cough - Acute Non-Productive-A-AH

## 2024-11-09 ENCOUNTER — Inpatient Hospital Stay: Admitting: Pharmacist

## 2024-11-09 ENCOUNTER — Inpatient Hospital Stay: Attending: Oncology

## 2024-11-09 ENCOUNTER — Inpatient Hospital Stay

## 2024-11-09 ENCOUNTER — Ambulatory Visit

## 2024-11-09 VITALS — BP 130/80 | HR 95 | Ht 65.0 in | Wt 196.6 lb

## 2024-11-09 DIAGNOSIS — C50912 Malignant neoplasm of unspecified site of left female breast: Secondary | ICD-10-CM | POA: Insufficient documentation

## 2024-11-09 DIAGNOSIS — Z170 Estrogen receptor positive status [ER+]: Secondary | ICD-10-CM | POA: Insufficient documentation

## 2024-11-09 DIAGNOSIS — J432 Centrilobular emphysema: Secondary | ICD-10-CM | POA: Diagnosis not present

## 2024-11-09 DIAGNOSIS — R059 Cough, unspecified: Secondary | ICD-10-CM | POA: Insufficient documentation

## 2024-11-09 DIAGNOSIS — Z79899 Other long term (current) drug therapy: Secondary | ICD-10-CM | POA: Diagnosis not present

## 2024-11-09 DIAGNOSIS — C50919 Malignant neoplasm of unspecified site of unspecified female breast: Secondary | ICD-10-CM

## 2024-11-09 DIAGNOSIS — C50911 Malignant neoplasm of unspecified site of right female breast: Secondary | ICD-10-CM | POA: Diagnosis present

## 2024-11-09 DIAGNOSIS — Z5111 Encounter for antineoplastic chemotherapy: Secondary | ICD-10-CM | POA: Diagnosis present

## 2024-11-09 LAB — CMP (CANCER CENTER ONLY)
ALT: 34 U/L (ref 0–44)
AST: 38 U/L (ref 15–41)
Albumin: 4.3 g/dL (ref 3.5–5.0)
Alkaline Phosphatase: 93 U/L (ref 38–126)
Anion gap: 15 (ref 5–15)
BUN: 32 mg/dL — ABNORMAL HIGH (ref 8–23)
CO2: 24 mmol/L (ref 22–32)
Calcium: 9.3 mg/dL (ref 8.9–10.3)
Chloride: 107 mmol/L (ref 98–111)
Creatinine: 1.87 mg/dL — ABNORMAL HIGH (ref 0.44–1.00)
GFR, Estimated: 28 mL/min — ABNORMAL LOW (ref >60)
Glucose, Bld: 147 mg/dL — ABNORMAL HIGH (ref 70–99)
Potassium: 4.4 mmol/L (ref 3.5–5.1)
Sodium: 146 mmol/L — ABNORMAL HIGH (ref 135–145)
Total Bilirubin: <0.2 mg/dL (ref 0.0–1.2)
Total Protein: 7.3 g/dL (ref 6.5–8.1)

## 2024-11-09 LAB — CBC WITH DIFFERENTIAL (CANCER CENTER ONLY)
Abs Immature Granulocytes: 0.02 K/uL (ref 0.00–0.07)
Basophils Absolute: 0.1 K/uL (ref 0.0–0.1)
Basophils Relative: 2 %
Eosinophils Absolute: 0.2 K/uL (ref 0.0–0.5)
Eosinophils Relative: 4 %
HCT: 30.5 % — ABNORMAL LOW (ref 36.0–46.0)
Hemoglobin: 10 g/dL — ABNORMAL LOW (ref 12.0–15.0)
Immature Granulocytes: 1 %
Lymphocytes Relative: 31 %
Lymphs Abs: 1.3 K/uL (ref 0.7–4.0)
MCH: 35.2 pg — ABNORMAL HIGH (ref 26.0–34.0)
MCHC: 32.8 g/dL (ref 30.0–36.0)
MCV: 107.4 fL — ABNORMAL HIGH (ref 80.0–100.0)
Monocytes Absolute: 0.7 K/uL (ref 0.1–1.0)
Monocytes Relative: 15 %
Neutro Abs: 2.2 K/uL (ref 1.7–7.7)
Neutrophils Relative %: 47 %
Platelet Count: 170 K/uL (ref 150–400)
RBC: 2.84 MIL/uL — ABNORMAL LOW (ref 3.87–5.11)
RDW: 14.7 % (ref 11.5–15.5)
WBC Count: 4.4 K/uL (ref 4.0–10.5)
nRBC: 0 % (ref 0.0–0.2)

## 2024-11-09 MED ORDER — FULVESTRANT 250 MG/5ML IM SOSY
500.0000 mg | PREFILLED_SYRINGE | Freq: Once | INTRAMUSCULAR | Status: AC
  Administered 2024-11-09: 10:00:00 500 mg via INTRAMUSCULAR
  Filled 2024-11-09: qty 10

## 2024-11-09 NOTE — Progress Notes (Signed)
 Acute Patient Visit  Physician: Johnathan Heskett A Tiannah Greenly, MD  Patient: Cassidy Norris MRN: [id number] DOB: 04-09-1951 PCP: Edman Marsa PARAS, DO     Subjective:   Chief Complaint  Patient presents with   Cough    Patient states she started coughing Friday.     HPI: The patient is a 73 y.o. female who presents today for:   Discussed the use of AI scribe software for clinical note transcription with the patient, who gave verbal consent to proceed.  History of Present Illness   Cassidy Norris is a 73 year old female with emphysema who presents with a persistent cough.  Cough - Occasional cough present - Nocturnal symptoms absent; cough does not disturb sleep - No associated fever or significant phlegm production - No shortness of breath - Symptom management with over-the-counter medications including DayQuil and NyQuil - Concerned about impact on upcoming holiday activities  Pulmonary history - Emphysema diagnosed previously - Not currently using inhalers - Pulmonary function test performed in the past; interval since last test is prolonged  General well-being - Nutrition and sleep unchanged; able to eat and sleep well - Generally feels well aside from cough  Lifestyle and environmental exposure - Enjoys spending time outdoors with her dog - Limiting outdoor activities due to colder weather and cough - No tobacco use  ROS:   As noted in the HPI    ASSESMENT/PLAN:  Encounter Diagnoses  Name Primary?   Cough, unspecified type Yes   Centrilobular emphysema (HCC)     No orders of the defined types were placed in this encounter.   Assessment and Plan      #1 cough - the cough is generally mild and there are no associated concerning symptoms today on exam.  Discussed with patient cautious use of over-the-counter medication for relief she may use honey as well monitor for shortness of breath fever or any acute increased phlegm production.  Patient  appears well, normal VS.  Suspect cough related to underlying emphysema without exacerbation  2.  Pulmonary emphysema-course has been uncomplicated.  Lungs are clear with expected mild changes  - monitor for shortness of breath with with exertion.  She has a follow-up with her PCP Monday      OBJECTIVE: Vitals:   11/09/24 1304  BP: 130/80  Pulse: 95  SpO2: 98%  Weight: 196 lb 9.6 oz (89.2 kg)  Height: 5' 5 (1.651 m)    Body mass index is 32.72 kg/m.   Physical Exam Vitals reviewed.  Constitutional:      Appearance: Normal appearance. Well-developed with normal weight.  Cardiovascular:     Rate and Rhythm: Normal rate and regular rhythm. Normal heart sounds. Normal peripheral pulses Pulmonary:     Normal breath sounds with normal effort, course breath sounds at base bilat Skin:    General: Skin is warm and dry without noticeable rash. Neurological:     General: No focal deficit present.  Psychiatric:        Mood and Affect: Mood, behavior and cognition normal       Allergies Patient has no known allergies.  Past Medical History Patient  has a past medical history of Actinic keratosis, Anemia, Arthritis (not really diagnosis but have pain), B12 deficiency (07/23/2016), Blood transfusion without reported diagnosis (1983 had a miscarriage/dnc), Cancer (HCC) (12/30/2013), Cancer (HCC) (01/09/2014), Cataract (2014?), Centrilobular emphysema (HCC) (10/27/2021), CKD (chronic kidney disease), stage IV (HCC) (07/13/2023), Complication of anesthesia (03/06/2022), Diabetes mellitus without complication (  HCC), Diffuse cystic mastopathy, Erosive esophagitis, Essential hypertension, benign (09/24/2017), Family history of malignant neoplasm of gastrointestinal tract (2012), Fractured elbow (08/28/2014), Gastroesophageal reflux disease, metabolic acidosis with increased anion gap, Increased heart rate, Multiple sclerosis, Neuromuscular disorder (HCC), Obesity, unspecified, Peripheral  neuropathy, Personal history of tobacco use, presenting hazards to health, Restless leg syndrome, Sleep apnea, Special screening for malignant neoplasms, colon, Squamous cell carcinoma of skin (08/24/2022), Squamous cell carcinoma of skin (07/20/2023), and Squamous cell carcinoma of skin (07/20/2023).  Surgical History Patient  has a past surgical history that includes Wisdom tooth extraction (2006); Polypectomy (1989); Dilation and curettage of uterus (1983); Colonoscopy (2010); Tubal ligation; Portacath placement (2015); Spine surgery (09/14/2014); Breast biopsy (Right, [id number]); Breast biopsy (Right, 11/07/2013); Breast biopsy (Left, 11/27/2013); Breast surgery (Bilateral, 01/09/2014); Colonoscopy with propofol  (N/A, 06/11/2015); Cholecystectomy; Total knee arthroplasty (Left, 11/22/2015); PORTA CATH REMOVAL; Dilatation & curettage/hysteroscopy with myosure (N/A, 07/12/2018); Joint replacement (Left, 11/23/2015); Colonoscopy with propofol  (N/A, 12/23/2021); Esophagogastroduodenoscopy (N/A, 12/23/2021); Knee Arthroplasty (Right, 03/06/2022); cataract surgery (Left, 08/13/2022); Skin biopsy; and Laparoscopic bilateral salpingo oophorectomy (N/A, 08/04/2023).  Family History Pateint's family history includes Breast cancer in her maternal aunt, paternal aunt, and paternal aunt; COPD in her mother; Colon cancer (age of onset: 70) in her father; Diabetes in her maternal grandmother; Hypertension in her sister; Lung cancer (age of onset: 17) in her mother; Pancreatic cancer in her paternal grandfather; Rectal cancer in her cousin, cousin, paternal uncle, and paternal uncle; Stomach cancer (age of onset: 29) in her father.  Social History Patient  reports that she quit smoking about 10 years ago. Her smoking use included cigarettes. She started smoking about 40 years ago. She has a 30 pack-year smoking history. She has quit using smokeless tobacco. She reports that she does not currently use alcohol. She  reports that she does not use drugs.    11/09/2024

## 2024-11-09 NOTE — Progress Notes (Signed)
 Oral Chemotherapy Clinic [facility]  [phone] [phone]  Patient Care Team: Edman Marsa PARAS, DO as PCP - General (Family Medicine) Darron Deatrice LABOR, MD as PCP - Cardiology (Cardiology) Skeet Juliene SAUNDERS, DO as Consulting Physician (Neurology) Jacobo Evalene PARAS, MD as Consulting Physician (Oncology) Maurie Rayfield BIRCH, RN as Oncology Nurse Navigator Mevelyn BIRCH [place], [place] (Optometry)   Name of the patient: Cassidy Norris  [id number]  [birth year]   Date of visit: 11/09/2024  HPI: Patient is a 73 y.o. female with recurrent breast cancer, ER/PR positive/HER2 negative. Currently treating with Kisqali  (ribociclib ) and fulvestrant . She started ribociclib  on 01/08/22. Her treatment was held for surgery which was completed on 08/04/23. Patient resumed her ribociclib  on 08/19/23. Patient was dosed reduced to 400mg  dosing on 04/27/24 due to fatigue.  Reason for Consult: Oral chemotherapy follow-up for ribociclib  therapy.   PAST MEDICAL HISTORY: Past Medical History:  Diagnosis Date   Actinic keratosis    Anemia    Arthritis not really diagnosis but have pain   B12 deficiency 07/23/2016   Will check levels to determine if injections are needed again. Await results. Recent CBC at Onc [facility]   Blood transfusion without reported diagnosis 1983 had a miscarriage/dnc   Cancer (HCC) 12/30/2013   Multifocal disease: pT1c,m, N0(isolated tumor cells) Er/ PR positive, Her 2 negative.   Cancer (HCC) 01/09/2014   Invasive lobular carcinoma, 2.2 cm, T2,N1 ER/PR positive, HER-2/neu negative   Cataract 2014?   Centrilobular emphysema (HCC) 10/27/2021   CKD (chronic kidney disease), stage IV (HCC) 07/13/2023   Complication of anesthesia 03/06/2022   Prolonged metabolism of Rocuronium  during case.   Diabetes mellitus without complication (HCC)    Diffuse cystic mastopathy    Erosive esophagitis    Essential hypertension, benign 09/24/2017   Family history of  malignant neoplasm of gastrointestinal tract 2012   Fractured elbow 08/28/2014   Gastroesophageal reflux disease    Hx of metabolic acidosis with increased anion gap    Increased heart rate    Multiple sclerosis    Neuromuscular disorder (HCC)    neuropathy in bil feet - left is worse.   Obesity, unspecified    Peripheral neuropathy    Personal history of tobacco use, presenting hazards to health    Restless leg syndrome    Sleep apnea    uses CPAP   Special screening for malignant neoplasms, colon    Squamous cell carcinoma of skin 08/24/2022   SCCIS - scalp, ED&C   Squamous cell carcinoma of skin 07/20/2023   SCC IS right of midline ant scalp post, tx with ED&C   Squamous cell carcinoma of skin 07/20/2023   SCC IS right of midline ant scalp ant, tx with [facility]    HEMATOLOGY/ONCOLOGY HISTORY:  Oncology History Overview Note  1. Bilateral carcinoma of breast, February, 2015. Right breast status post radical mastectomy. T1 C. N0M0 (isolated tumor cells in one lymph node) multifocal invasive cancer. Stage IC, Left breast, Status post radical mastectomy, T2, N1, M0 tumor. Stage II, RIght breast. Both tumors are estrogen receptor positive.  Progesterone receptor positive.  HER-2 receptor negative by FISH. Negative for BRCA mutation.  2. Started on Adriamycin and Cytoxan on March 01, 2014. Last cycle of Cytoxan and Adriamycin on May 01, 2014. Patient has finished last chemotherapy on September 9. (12 th dose was omitted because of neuropathy) 3.  Taking tamoxifen .  Patient had a poor tolerance to letrozole.(October, 2015) 4.  Patient is taking letrozole since  November of 2015 5.  May, 2016 Letrozole has been put on hold because of bony pains 6.  Started on Aromasin  from July of 2016    History of bilateral breast cancer (Resolved)  11/29/2013 Initial Diagnosis   Breast cancer bilateral, multifocal ER/PR pos, Her 2 neg,.     ALLERGIES:  has no known allergies.  MEDICATIONS:   Current Outpatient Medications  Medication Sig Dispense Refill   acetaminophen  (TYLENOL ) 500 MG tablet Take 1,000 mg by mouth 2 (two) times daily as needed for moderate pain or headache.     baclofen  (LIORESAL ) 10 MG tablet TAKE 1 TABLET BY MOUTH THREE TIMES A DAY AS NEEDED FOR MUSCLE SPASM 270 tablet 1   calcium  carbonate (OSCAL) 1500 (600 Ca) MG TABS tablet Take by mouth 2 (two) times daily with a meal.     dapagliflozin propanediol (FARXIGA) 10 MG TABS tablet Take 10 mg by mouth daily.     diclofenac  Sodium (VOLTAREN ) 1 % GEL Apply 2 g topically 3 (three) times daily as needed. 100 g 2   ezetimibe  (ZETIA ) 10 MG tablet Take 1 tablet (10 mg total) by mouth daily. 90 tablet 3   fluticasone  (FLONASE ) 50 MCG/ACT nasal spray PLACE 2 SPRAYS INTO BOTH NOSTRILS AT BEDTIME. 48 g 1   fulvestrant  (FASLODEX ) 250 MG/5ML injection Inject 500 mg into the muscle every 30 (thirty) days. One injection each buttock over 1-2 minutes. Warm prior to use.     Krill Oil 1000 MG CAPS Take 1,000 mg by mouth daily.     lisinopril  (ZESTRIL ) 5 MG tablet TAKE 1 TABLET (5 MG TOTAL) BY MOUTH DAILY. 90 tablet 0   loperamide  (IMODIUM ) 2 MG capsule TAKE 2 TABLETS BY MOUTH 4 TIMES A DAY DAILY AS NEEDED FOR DIARRHEA OR LOOSE STOOLS. 30 capsule 1   metFORMIN  (GLUCOPHAGE ) 500 MG tablet Take 1 tablet (500 mg total) by mouth 2 (two) times daily with a meal. 180 tablet 1   metoprolol  tartrate (LOPRESSOR ) 25 MG tablet TAKE 1/2 TABLET TWICE A DAY BY MOUTH 90 tablet 3   omeprazole  (PRILOSEC) 40 MG capsule TAKE 1 CAPSULE BY MOUTH TWICE A DAY 180 capsule 1   ondansetron  (ZOFRAN ) 8 MG tablet TAKE 1 TABLET BY MOUTH EVERY 8 HOURS AS NEEDED FOR NAUSEA AND VOMITING 20 tablet 0   prochlorperazine  (COMPAZINE ) 10 MG tablet Take 1 tablet (10 mg total) by mouth every 6 (six) hours as needed for nausea or vomiting. 20 tablet 1   ribociclib  succ (KISQALI  400MG  DAILY DOSE) 200 MG Therapy Pack Take 2 tablets (400 mg total) by mouth daily. Take for 21  days on, 7 days off, repeat every 28 days. 42 tablet 1   sodium chloride  (OCEAN) 0.65 % SOLN nasal spray Place 1 spray into both nostrils as needed for congestion.     traZODone  (DESYREL ) 50 MG tablet TAKE 1 TABLET (50 MG TOTAL) BY MOUTH AT BEDTIME AS NEEDED. FOR SLEEP 90 tablet 1   venlafaxine  XR (EFFEXOR -XR) 75 MG 24 hr capsule Take 1 capsule (75 mg total) by mouth daily with breakfast. 90 capsule 3   vitamin B-12 (CYANOCOBALAMIN ) 1000 MCG tablet Take 2,500 mcg by mouth every Monday, Wednesday, and Friday.     Vitamin D , Cholecalciferol , 50 MCG (2000 UT) CAPS Take 2,000 Units by mouth every Monday, Wednesday, and Friday.     No current facility-administered medications for this visit.    VITAL SIGNS: LMP 12/22/1999  There were no vitals filed for this visit.  Estimated body mass index is 33.12 kg/m as calculated from the following:   Height as of 10/12/24: 5' 5 (1.651 m).   Weight as of 10/12/24: 90.3 kg (199 lb).  LABS: CBC:    Component Value Date/Time   WBC 4.4 11/09/2024 0915   WBC 2.5 (L) 11/01/2024 0824   HGB 10.0 (L) 11/09/2024 0915   HGB 15.5 07/13/2017 0937   HCT 30.5 (L) 11/09/2024 0915   HCT 46.7 (H) 07/13/2017 0937   PLT 170 11/09/2024 0915   PLT 211 07/13/2017 0937   MCV 107.4 (H) 11/09/2024 0915   MCV 94 07/13/2017 0937   MCV 93 03/11/2015 1356   NEUTROABS 2.2 11/09/2024 0915   NEUTROABS 5.4 07/13/2017 0937   NEUTROABS 5.6 03/11/2015 1356   LYMPHSABS 1.3 11/09/2024 0915   LYMPHSABS 2.1 07/13/2017 0937   LYMPHSABS 2.3 03/11/2015 1356   MONOABS 0.7 11/09/2024 0915   MONOABS 1.0 (H) 03/11/2015 1356   EOSABS 0.2 11/09/2024 0915   EOSABS 0.2 07/13/2017 0937   EOSABS 0.3 03/11/2015 1356   BASOSABS 0.1 11/09/2024 0915   BASOSABS 0.0 07/13/2017 0937   BASOSABS 0.1 03/11/2015 1356   Comprehensive Metabolic Panel:    Component Value Date/Time   NA 146 (H) 11/09/2024 0915   NA 142 07/14/2019 1516   NA 139 03/11/2015 1356   K 4.4 11/09/2024 0915   K  3.4 (L) 03/11/2015 1356   CL 107 11/09/2024 0915   CL 103 03/11/2015 1356   CO2 24 11/09/2024 0915   CO2 28 03/11/2015 1356   BUN 32 (H) 11/09/2024 0915   BUN 12 07/14/2019 1516   BUN 13 03/11/2015 1356   CREATININE 1.87 (H) 11/09/2024 0915   CREATININE 1.96 (H) 11/01/2024 0824   GLUCOSE 147 (H) 11/09/2024 0915   GLUCOSE 118 (H) 03/11/2015 1356   CALCIUM  9.3 11/09/2024 0915   CALCIUM  9.2 03/11/2015 1356   AST 38 11/09/2024 0915   ALT 34 11/09/2024 0915   ALT 32 03/11/2015 1356   ALKPHOS 93 11/09/2024 0915   ALKPHOS 68 03/11/2015 1356   BILITOT <0.2 11/09/2024 0915   PROT 7.3 11/09/2024 0915   PROT 6.9 07/14/2019 1516   PROT 6.9 03/11/2015 1356   ALBUMIN 4.3 11/09/2024 0915   ALBUMIN 4.5 07/14/2019 1516   ALBUMIN 4.2 03/11/2015 1356     Present during today's visit: patient only   Assessment and Plan: CBC/CMP reviewed (note: ca27.29 is still pending), continue ribociclib  400mg  21on/7off, scheduled to begin new cycle today 11/09/2024 Patient to receive fulvestrant  today Renal function within range of normal for patient, patient continues to be borderline need for dose adjustment with eGFR of 28, but she is tolerating 400mg  dose will not adjust dose Overall patient is doing well, and has noticed no significant changes since her last office visit  Oral Chemotherapy Adherence: No missed doses reported in the last cycle There are no patient barriers to medication adherence identified.   New medications: None reported  Medication Access Issues: No issues, receives grant funding and fills at [employer] (Specialty).   Patient expressed understanding and was in agreement with this plan. She also understands that She can call clinic at any time with any questions, concerns, or complaints.   Follow-up plan: RTC in 4 weeks.   Thank you for allowing me to participate in the care of this very pleasant patient.   Time Total: 15 minutes  Visit consisted of counseling and  education on dealing with issues of symptom management in  the setting of serious and potentially life-threatening illness.Greater than 50%  of this time was spent counseling and coordinating care related to the above assessment and plan.  Signed by: Anahit Klumb N. Cadey Bazile, PharmD, NEILA, CPP Hematology/Oncology Clinical Pharmacist Practitioner [facility] [phone]  11/09/2024 10:30 AM

## 2024-11-10 LAB — CANCER ANTIGEN 27.29: CA 27.29: 39.1 U/mL — ABNORMAL HIGH (ref 0.0–38.6)

## 2024-11-13 ENCOUNTER — Encounter: Payer: Self-pay | Admitting: Family Medicine

## 2024-11-13 ENCOUNTER — Ambulatory Visit: Admitting: Family Medicine

## 2024-11-13 VITALS — BP 130/70 | HR 94 | Ht 65.0 in | Wt 197.2 lb

## 2024-11-13 DIAGNOSIS — E785 Hyperlipidemia, unspecified: Secondary | ICD-10-CM | POA: Diagnosis not present

## 2024-11-13 DIAGNOSIS — C50919 Malignant neoplasm of unspecified site of unspecified female breast: Secondary | ICD-10-CM | POA: Diagnosis not present

## 2024-11-13 DIAGNOSIS — G720 Drug-induced myopathy: Secondary | ICD-10-CM | POA: Diagnosis not present

## 2024-11-13 DIAGNOSIS — G620 Drug-induced polyneuropathy: Secondary | ICD-10-CM | POA: Diagnosis not present

## 2024-11-13 DIAGNOSIS — T451X5A Adverse effect of antineoplastic and immunosuppressive drugs, initial encounter: Secondary | ICD-10-CM

## 2024-11-13 DIAGNOSIS — E1122 Type 2 diabetes mellitus with diabetic chronic kidney disease: Secondary | ICD-10-CM | POA: Diagnosis not present

## 2024-11-13 DIAGNOSIS — G35D Multiple sclerosis, unspecified: Secondary | ICD-10-CM

## 2024-11-13 DIAGNOSIS — N184 Chronic kidney disease, stage 4 (severe): Secondary | ICD-10-CM

## 2024-11-13 DIAGNOSIS — M170 Bilateral primary osteoarthritis of knee: Secondary | ICD-10-CM

## 2024-11-13 DIAGNOSIS — G4700 Insomnia, unspecified: Secondary | ICD-10-CM | POA: Diagnosis not present

## 2024-11-13 DIAGNOSIS — J432 Centrilobular emphysema: Secondary | ICD-10-CM

## 2024-11-13 DIAGNOSIS — E1169 Type 2 diabetes mellitus with other specified complication: Secondary | ICD-10-CM | POA: Diagnosis not present

## 2024-11-13 DIAGNOSIS — I10 Essential (primary) hypertension: Secondary | ICD-10-CM

## 2024-11-13 MED ORDER — METFORMIN HCL 500 MG PO TABS: ORAL_TABLET | ORAL | 1 refills | Status: DC

## 2024-11-13 MED ORDER — TRAZODONE HCL 50 MG PO TABS: 50.0000 mg | ORAL_TABLET | Freq: Every evening | ORAL | 1 refills | Status: AC | PRN

## 2024-11-13 MED ORDER — LISINOPRIL 5 MG PO TABS: 5.0000 mg | ORAL_TABLET | Freq: Every day | ORAL | 1 refills | Status: AC

## 2024-11-13 MED ORDER — BENZONATATE 100 MG PO CAPS: 100.0000 mg | ORAL_CAPSULE | Freq: Three times a day (TID) | ORAL | 0 refills | Status: AC | PRN

## 2024-11-13 MED ORDER — AZITHROMYCIN 250 MG PO TABS: ORAL_TABLET | ORAL | 0 refills | Status: DC

## 2024-11-13 NOTE — Progress Notes (Signed)
 "  Subjective:    Patient ID: Cassidy Norris, female    DOB: March 01, 1951, 73 y.o.   MRN: [id number]  [name] is a 73 y.o. female presenting on 11/13/2024 for Annual Exam   HPI  Discussed the use of AI scribe software for clinical note transcription with the patient, who gave verbal consent to proceed.  History of Present Illness   [name] is a 73 year old female with chronic kidney disease who presents for a routine follow-up.  Foot pain and neuropathy - Foot pain localized to the ball of the left foot, occurring when stepping down in the morning and resolving after a few steps - 'Funny' sensation in both feet at night, attributed to neuropathy from prior chemotherapy - Symptoms worsen without shoes - Left foot exhibits more pronounced neuropathic symptoms than the right, onset after knee surgery in 2016  Recent Bronchitis Respiratory symptoms - Persistent cough, worse at night, sometimes severe enough to induce gagging or feeling of impending emesis - Cassidy fever - Cough attributed to changes in temperature and activity - History of emphysema - Occasional nasal congestion     Recurrent, metastatic ER/PR, HER2 negative Breast Cancer Followed by Dr Jacobo, [facility] Oncology PET Scan from Feb 2023, and biopsy proven recurrent disease Initial CA 27-29 260.1, now stable between 86-91 On oral chemotherapy    Anemia, chronic stable unchanged   Hypertension Creatinine CKD-IV Followed by Oncology - on chemotherapy Followed by Nephrology [facility] Dr Dennise - Inadequate fluid intake, currently attempting to increase water consumption Last lab Cr 1.87   CHRONIC DM, Type 2 with complication Diabetic Nephropathy CKD IV A1c up 6.2, previously 5.8 to 6.0 Less water, more convenience foods higher carb - Increase attributed to higher carbohydrate intake and decreased physical activity due to cold weather - Currently taking metformin  500mg  in AM and 1000mg  PM and Farxiga 10mg ,  with Farxiga primarily for kidney protection Reports good compliance. Tolerating well w/o side-effects Currently on ACEi Lifestyle: Denies hypoglycemia, polyuria, visual changes, numbness or tingling.   Cataracts Followed by Eye Doctor   HYPERLIPIDEMIA: Drug Myopathy Elevated TG, cannot result LDL Hypertriglyceridemia - History of elevated triglycerides with fluctuating levels - Recent decrease in triglyceride levels from 700 to 400 - Currently taking Zetia  for cholesterol management Cannot tolerate Statin therapy  Chemotherapy Induced Neuropathy Chronic problem with nerve pain Previous treatment not effective   Insomnia Controlled on Trazodone  50mg  nightly   GERD Omeprazole  40mg  TWICE A DAY         11/13/2024    8:36 AM 07/20/2024    8:22 AM 05/25/2024   10:25 AM  Depression screen PHQ 2/9  Decreased Interest 0 0 0  Down, Depressed, Hopeless 0 0 0  PHQ - 2 Score 0 0 0  Altered sleeping 1    Tired, decreased energy 0    Change in appetite 1    Feeling bad or failure about yourself  0    Trouble concentrating 0    Moving slowly or fidgety/restless 0    Suicidal thoughts 0    PHQ-9 Score 2    Difficult doing work/chores Not difficult at all         11/13/2024    8:36 AM 05/02/2024    8:59 AM 11/02/2023    9:24 AM 09/28/2023    2:30 PM  GAD 7 : Generalized Anxiety Score  Nervous, Anxious, on Edge 0 0 0 0  Control/stop worrying 0 0 0 0  Worry  too much - different things 0 0 0 0  Trouble relaxing 0 0 0 0  Restless 0 0 0 0  Easily annoyed or irritable 0 0 1 1  Afraid - awful might happen 0 0 0 0  Total GAD 7 Score 0 0 1 1     Past Medical History:  Diagnosis Date   Actinic keratosis    Anemia    Arthritis not really diagnosis but have pain   B12 deficiency 07/23/2016   Will check levels to determine if injections are needed again. Await results. Recent CBC at Onc WNL   Blood transfusion without reported diagnosis 1983 had a miscarriage/dnc   Cancer  (HCC) 12/30/2013   Multifocal disease: pT1c,m, N0(isolated tumor cells) Er/ PR positive, Her 2 negative.   Cancer (HCC) 01/09/2014   Invasive lobular carcinoma, 2.2 cm, T2,N1 ER/PR positive, HER-2/neu negative   Cataract 2014?   Centrilobular emphysema (HCC) 10/27/2021   CKD (chronic kidney disease), stage IV (HCC) 07/13/2023   Complication of anesthesia 03/06/2022   Prolonged metabolism of Rocuronium  during case.   Diabetes mellitus without complication (HCC)    Diffuse cystic mastopathy    Erosive esophagitis    Essential hypertension, benign 09/24/2017   Family history of malignant neoplasm of gastrointestinal tract 2012   Fractured elbow 08/28/2014   Gastroesophageal reflux disease    Hx of metabolic acidosis with increased anion gap    Increased heart rate    Multiple sclerosis    Neuromuscular disorder (HCC)    neuropathy in bil feet - left is worse.   Obesity, unspecified    Peripheral neuropathy    Personal history of tobacco use, presenting hazards to health    Restless leg syndrome    Sleep apnea    uses CPAP   Special screening for malignant neoplasms, colon    Squamous cell carcinoma of skin 08/24/2022   SCCIS - scalp, ED&C   Squamous cell carcinoma of skin 07/20/2023   SCC IS right of midline ant scalp post, tx with ED&C   Squamous cell carcinoma of skin 07/20/2023   SCC IS right of midline ant scalp ant, tx with [facility]   Past Surgical History:  Procedure Laterality Date   BREAST BIOPSY Right 1973,2001   BREAST BIOPSY Right 11/07/2013   INVASIVE MAMMARY CARCINOMA , ER/PR positive Her2 negative   BREAST BIOPSY Left 11/27/2013   invasive lobular and DCIS   BREAST SURGERY Bilateral 01/09/2014   mastectomy   cataract surgery Left 08/13/2022   CHOLECYSTECTOMY     COLONOSCOPY  2010   Dr. Dellie   COLONOSCOPY WITH PROPOFOL  N/A 06/11/2015   Procedure: COLONOSCOPY WITH PROPOFOL ;  Surgeon: Louanne KANDICE Dellie, MD;  Location: [facility] ENDOSCOPY;  Service: Endoscopy;   Laterality: N/A;   COLONOSCOPY WITH PROPOFOL  N/A 12/23/2021   Procedure: COLONOSCOPY WITH PROPOFOL ;  Surgeon: Unk Corinn Skiff, MD;  Location: [facility] ENDOSCOPY;  Service: Gastroenterology;  Laterality: N/A;   DILATATION & CURETTAGE/HYSTEROSCOPY WITH MYOSURE N/A 07/12/2018   Procedure: DILATATION & CURETTAGE/HYSTEROSCOPY WITH MYOSURE WITH ENDOMETRIAL POLYPECTOMY;  Surgeon: Leonce Garnette BIRCH, MD;  Location: [facility] ORS;  Service: Gynecology;  Laterality: N/A;   DILATION AND CURETTAGE OF UTERUS  1983   ESOPHAGOGASTRODUODENOSCOPY N/A 12/23/2021   Procedure: ESOPHAGOGASTRODUODENOSCOPY (EGD);  Surgeon: Unk Corinn Skiff, MD;  Location: [facility] ENDOSCOPY;  Service: Gastroenterology;  Laterality: N/A;   JOINT REPLACEMENT Left 11/23/2015   right  2023   KNEE ARTHROPLASTY Right 03/06/2022   Procedure: COMPUTER ASSISTED TOTAL KNEE ARTHROPLASTY;  Surgeon:  Hooten, Lynwood SQUIBB, MD;  Location: [facility] ORS;  Service: Orthopedics;  Laterality: Right;   LAPAROSCOPIC BILATERAL SALPINGO OOPHERECTOMY N/A 08/04/2023   Procedure: LAPAROSCOPIC BILATERAL SALPINGO OOPHORECTOMY;  Surgeon: Mancil Barter, MD;  Location: [facility] ORS;  Service: Gynecology;  Laterality: N/A;   POLYPECTOMY  1989   PORTA CATH REMOVAL     PORTACATH PLACEMENT  2015   SKIN BIOPSY     on scalp   SPINE SURGERY  09/14/2014   Ruptured disk L4- L5   TOTAL KNEE ARTHROPLASTY Left 11/22/2015   Procedure: TOTAL KNEE ARTHROPLASTY;  Surgeon: Toribio Silos, MD;  Location: [facility] OR;  Service: Orthopedics;  Laterality: Left;   TUBAL LIGATION     WISDOM TOOTH EXTRACTION  2006   Social History   Socioeconomic History   Marital status: Widowed    Spouse name: Not on file   Number of children: Not on file   Years of education: some college   Highest education level: 12th grade  Occupational History   Occupation: retired  Tobacco Use   Smoking status: Former    Current packs/day: 0.00    Average packs/day: 1 pack/day for 30.0 years (30.0 ttl pk-yrs)    Types:  Cigarettes    Start date: 12/12/1983    Quit date: 12/11/2013    Years since quitting: 10.9   Smokeless tobacco: Former  Building Services Engineer status: Never Used  Substance and Sexual Activity   Alcohol use: Not Currently   Drug use: Cassidy   Sexual activity: Not Currently    Birth control/protection: Post-menopausal  Other Topics Concern   Not on file  Social History Narrative   Right handed   Drinks caffeine   One story home   Social Drivers of Health   Tobacco Use: Medium Risk (11/13/2024)   Patient History    Smoking Tobacco Use: Former    Smokeless Tobacco Use: Former    Passive Exposure: Not on Actuary Strain: Low Risk (11/08/2024)   Overall Financial Resource Strain (CARDIA)    Difficulty of Paying Living Expenses: Not hard at all  Food Insecurity: Cassidy Food Insecurity (11/08/2024)   Epic    Worried About Radiation Protection Practitioner of Food in the Last Year: Never true    Ran Out of Food in the Last Year: Never true  Transportation Needs: Cassidy Transportation Needs (11/08/2024)   Epic    Lack of Transportation (Medical): Cassidy    Lack of Transportation (Non-Medical): Cassidy  Physical Activity: Inactive (11/08/2024)   Exercise Vital Sign    Days of Exercise per Week: 0 days    Minutes of Exercise per Session: Not on file  Stress: Cassidy Stress Concern Present (07/20/2024)   [employer] of Occupational Health - Occupational Stress Questionnaire    Feeling of Stress: Not at all  Social Connections: Unknown (11/08/2024)   Social Connection and Isolation Panel    Frequency of Communication with Friends and Family: Twice a week    Frequency of Social Gatherings with Friends and Family: Once a week    Attends Religious Services: More than 4 times per year    Active Member of [employer] or Organizations: Not on file    Attends Banker Meetings: Not on file    Marital Status: Widowed  Intimate Partner Violence: Not At Risk (07/20/2024)   Epic    Fear of Current or Ex-Partner:  Cassidy    Emotionally Abused: Cassidy    Physically Abused: Cassidy    Sexually Abused:  Cassidy  Depression (PHQ2-9): Low Risk (11/13/2024)   Depression (PHQ2-9)    PHQ-2 Score: 2  Alcohol Screen: Low Risk (07/20/2024)   Alcohol Screen    Last Alcohol Screening Score (AUDIT): 0  Housing: Low Risk (11/08/2024)   Epic    Unable to Pay for Housing in the Last Year: Cassidy    Number of Times Moved in the Last Year: 0    Homeless in the Last Year: Cassidy  Utilities: Not At Risk (07/20/2024)   Epic    Threatened with loss of utilities: Cassidy  Health Literacy: Adequate Health Literacy (07/20/2024)   B1300 Health Literacy    Frequency of need for help with medical instructions: Never   Family History  Problem Relation Age of Onset   Lung cancer Mother 67   COPD Mother    Colon cancer Father 54   Stomach cancer Father 83   Hypertension Sister    Breast cancer Maternal Aunt        dx over 72   Breast cancer Paternal Aunt        dx 57s   Breast cancer Paternal Aunt        dx 30s   Rectal cancer Paternal Uncle        dx 59s   Rectal cancer Paternal Uncle        dx 57s   Diabetes Maternal Grandmother    Pancreatic cancer Paternal Grandfather    Rectal cancer Cousin        dx 77s-60s   Rectal cancer Cousin        dx >50   Current Outpatient Medications on File Prior to Visit  Medication Sig   acetaminophen  (TYLENOL ) 500 MG tablet Take 1,000 mg by mouth 2 (two) times daily as needed for moderate pain or headache.   baclofen  (LIORESAL ) 10 MG tablet TAKE 1 TABLET BY MOUTH THREE TIMES A DAY AS NEEDED FOR MUSCLE SPASM   calcium  carbonate (OSCAL) 1500 (600 Ca) MG TABS tablet Take by mouth 2 (two) times daily with a meal.   dapagliflozin propanediol (FARXIGA) 10 MG TABS tablet Take 10 mg by mouth daily.   diclofenac  Sodium (VOLTAREN ) 1 % GEL Apply 2 g topically 3 (three) times daily as needed.   ezetimibe  (ZETIA ) 10 MG tablet Take 1 tablet (10 mg total) by mouth daily.   fluticasone  (FLONASE ) 50 MCG/ACT nasal spray  PLACE 2 SPRAYS INTO BOTH NOSTRILS AT BEDTIME.   fulvestrant  (FASLODEX ) 250 MG/5ML injection Inject 500 mg into the muscle every 30 (thirty) days. One injection each buttock over 1-2 minutes. Warm prior to use.   Krill Oil 1000 MG CAPS Take 1,000 mg by mouth daily.   loperamide  (IMODIUM ) 2 MG capsule TAKE 2 TABLETS BY MOUTH 4 TIMES A DAY DAILY AS NEEDED FOR DIARRHEA OR LOOSE STOOLS.   metoprolol  tartrate (LOPRESSOR ) 25 MG tablet TAKE 1/2 TABLET TWICE A DAY BY MOUTH   omeprazole  (PRILOSEC) 40 MG capsule TAKE 1 CAPSULE BY MOUTH TWICE A DAY   ondansetron  (ZOFRAN ) 8 MG tablet TAKE 1 TABLET BY MOUTH EVERY 8 HOURS AS NEEDED FOR NAUSEA AND VOMITING   prochlorperazine  (COMPAZINE ) 10 MG tablet Take 1 tablet (10 mg total) by mouth every 6 (six) hours as needed for nausea or vomiting.   ribociclib  succ (KISQALI  400MG  DAILY DOSE) 200 MG Therapy Pack Take 2 tablets (400 mg total) by mouth daily. Take for 21 days on, 7 days off, repeat every 28 days.   sodium chloride  (OCEAN) 0.65 %  SOLN nasal spray Place 1 spray into both nostrils as needed for congestion.   venlafaxine  XR (EFFEXOR -XR) 75 MG 24 hr capsule Take 1 capsule (75 mg total) by mouth daily with breakfast.   vitamin B-12 (CYANOCOBALAMIN ) 1000 MCG tablet Take 2,500 mcg by mouth every Monday, Wednesday, and Friday.   Vitamin D , Cholecalciferol , 50 MCG (2000 UT) CAPS Take 2,000 Units by mouth every Monday, Wednesday, and Friday.   Cassidy current facility-administered medications on file prior to visit.    Review of Systems  Constitutional:  Negative for activity change, appetite change, chills, diaphoresis, fatigue and fever.  HENT:  Negative for congestion and hearing loss.   Eyes:  Negative for visual disturbance.  Respiratory:  Negative for cough, chest tightness, shortness of breath and wheezing.   Cardiovascular:  Negative for chest pain, palpitations and leg swelling.  Gastrointestinal:  Negative for abdominal pain, constipation, diarrhea, nausea and  vomiting.  Genitourinary:  Negative for dysuria, frequency and hematuria.  Musculoskeletal:  Negative for arthralgias and neck pain.  Skin:  Negative for rash.  Neurological:  Negative for dizziness, weakness, light-headedness, numbness and headaches.  Hematological:  Negative for adenopathy.  Psychiatric/Behavioral:  Negative for behavioral problems, dysphoric mood and sleep disturbance.    Per HPI unless specifically indicated above     Objective:    BP 130/70 (BP Location: Left Arm, Patient Position: Sitting, Cuff Size: Normal)   Pulse 94   Ht 5' 5 (1.651 m)   Wt 197 lb 4 oz (89.5 kg)   LMP 12/22/1999   SpO2 99%   BMI 32.82 kg/m   Wt Readings from Last 3 Encounters:  11/13/24 197 lb 4 oz (89.5 kg)  11/09/24 196 lb 9.6 oz (89.2 kg)  10/12/24 199 lb (90.3 kg)    Physical Exam Vitals and nursing note reviewed.  Constitutional:      General: She is not in acute distress.    Appearance: She is well-developed. She is not diaphoretic.     Comments: Well-appearing, comfortable, cooperative  HENT:     Head: Normocephalic and atraumatic.  Eyes:     General:        Right eye: Cassidy discharge.        Left eye: Cassidy discharge.     Conjunctiva/sclera: Conjunctivae normal.     Pupils: Pupils are equal, round, and reactive to light.  Neck:     Thyroid : Cassidy thyromegaly.  Cardiovascular:     Rate and Rhythm: Normal rate and regular rhythm.     Pulses: Normal pulses.     Heart sounds: Normal heart sounds. Cassidy murmur heard. Pulmonary:     Effort: Pulmonary effort is normal. Cassidy respiratory distress.     Breath sounds: Cassidy wheezing or rales.     Comments: Mild coarse breath sounds and cough Abdominal:     General: Bowel sounds are normal. There is Cassidy distension.     Palpations: Abdomen is soft. There is Cassidy mass.     Tenderness: There is Cassidy abdominal tenderness.  Musculoskeletal:        General: Cassidy tenderness. Normal range of motion.     Cervical back: Normal range of motion and neck  supple.     Comments: Upper / Lower Extremities: - Normal muscle tone, strength bilateral upper extremities 5/5, lower extremities 5/5  Lymphadenopathy:     Cervical: Cassidy cervical adenopathy.  Skin:    General: Skin is warm and dry.     Findings: Cassidy erythema or rash.  Neurological:  Mental Status: She is alert and oriented to person, place, and time.     Comments: Distal sensation intact to light touch all extremities  Psychiatric:        Mood and Affect: Mood normal.        Behavior: Behavior normal.        Thought Content: Thought content normal.     Comments: Well groomed, good eye contact, normal speech and thoughts     Recent Labs    05/02/24 0919 11/01/24 0824  HGBA1C 6.0* 6.2*    Diabetic Foot Exam - Simple   Simple Foot Form Diabetic Foot exam was performed with the following findings: Yes 11/13/2024  9:07 AM  Visual Inspection See comments: Yes Sensation Testing See comments: Yes Pulse Check Posterior Tibialis and Dorsalis pulse intact bilaterally: Yes Comments Bilateral hammer toes LEFT worse than RIGHT, Cassidy other structural abnormality. Cassidy ulceration, Cassidy callus. Intact monofilament but has reduced sensation on Left foot.      Results for orders placed or performed in visit on 11/09/24  Cancer antigen 27.29   Collection Time: 11/09/24  9:15 AM  Result Value Ref Range   CA 27.29 39.1 (H) 0.0 - 38.6 U/mL  CBC with Differential (Cancer Center Only)   Collection Time: 11/09/24  9:15 AM  Result Value Ref Range   WBC Count 4.4 4.0 - 10.5 K/uL   RBC 2.84 (L) 3.87 - 5.11 MIL/uL   Hemoglobin 10.0 (L) 12.0 - 15.0 g/dL   HCT 69.4 (L) 63.9 - 53.9 %   MCV 107.4 (H) 80.0 - 100.0 fL   MCH 35.2 (H) 26.0 - 34.0 pg   MCHC 32.8 30.0 - 36.0 g/dL   RDW 85.2 88.4 - 84.4 %   Platelet Count 170 150 - 400 K/uL   nRBC 0.0 0.0 - 0.2 %   Neutrophils Relative % 47 %   Neutro Abs 2.2 1.7 - 7.7 K/uL   Lymphocytes Relative 31 %   Lymphs Abs 1.3 0.7 - 4.0 K/uL   Monocytes  Relative 15 %   Monocytes Absolute 0.7 0.1 - 1.0 K/uL   Eosinophils Relative 4 %   Eosinophils Absolute 0.2 0.0 - 0.5 K/uL   Basophils Relative 2 %   Basophils Absolute 0.1 0.0 - 0.1 K/uL   Immature Granulocytes 1 %   Abs Immature Granulocytes 0.02 0.00 - 0.07 K/uL  CMP (Cancer Center only)   Collection Time: 11/09/24  9:15 AM  Result Value Ref Range   Sodium 146 (H) 135 - 145 mmol/L   Potassium 4.4 3.5 - 5.1 mmol/L   Chloride 107 98 - 111 mmol/L   CO2 24 22 - 32 mmol/L   Glucose, Bld 147 (H) 70 - 99 mg/dL   BUN 32 (H) 8 - 23 mg/dL   Creatinine 8.12 (H) 9.55 - 1.00 mg/dL   Calcium  9.3 8.9 - 10.3 mg/dL   Total Protein 7.3 6.5 - 8.1 g/dL   Albumin 4.3 3.5 - 5.0 g/dL   AST 38 15 - 41 U/L   ALT 34 0 - 44 U/L   Alkaline Phosphatase 93 38 - 126 U/L   Total Bilirubin <0.2 0.0 - 1.2 mg/dL   GFR, Estimated 28 (L) >60 mL/min   Anion gap 15 5 - 15   *Note: Due to a large number of results and/or encounters for the requested time period, some results have not been displayed. A complete set of results can be found in Results Review.  Assessment & Plan:   Problem List Items Addressed This Visit     Centrilobular emphysema (HCC)   Relevant Medications   benzonatate  (TESSALON ) 100 MG capsule   azithromycin  (ZITHROMAX  Z-PAK) 250 MG tablet   CKD (chronic kidney disease), stage IV (HCC)   Relevant Medications   lisinopril  (ZESTRIL ) 5 MG tablet   Drug-induced myopathy   Relevant Orders   AMB Referral [facility] Care Management   Essential hypertension, benign   Relevant Medications   lisinopril  (ZESTRIL ) 5 MG tablet   Hyperlipidemia associated with type 2 diabetes mellitus (HCC)   Relevant Medications   metFORMIN  (GLUCOPHAGE ) 500 MG tablet   lisinopril  (ZESTRIL ) 5 MG tablet   Other Relevant Orders   AMB Referral [facility] Care Management   Insomnia   Relevant Medications   traZODone  (DESYREL ) 50 MG tablet   Multiple sclerosis   Osteoarthritis of knee   Peripheral neuropathy due to  chemotherapy   Relevant Medications   traZODone  (DESYREL ) 50 MG tablet   Recurrent breast cancer (HCC)   Relevant Medications   azithromycin  (ZITHROMAX  Z-PAK) 250 MG tablet   Type 2 diabetes mellitus with other specified complication (HCC) - Primary   Relevant Medications   metFORMIN  (GLUCOPHAGE ) 500 MG tablet   lisinopril  (ZESTRIL ) 5 MG tablet     Updated Health Maintenance information Reviewed recent lab results with patient Encouraged improvement to lifestyle with diet and exercise Goal of weight loss   Peripheral neuropathy due to chemotherapy Intermittent neuropathic symptoms in the left foot, likely related to previous chemotherapy treatment. - Continue annual foot exams to monitor neuropathy progression.  Multiple Sclerosis Followed by Neurology  Recurrent Breast Cancer Followed by [facility] Oncology, on therapy  Type 2 diabetes mellitus with stage 4 chronic kidney disease A1c increased to 6.2, likely due to increased carbohydrate intake. Current management includes metformin  and Farxiga, with Doreen supporting kidney function. Hydration needs improvement. - Increased metformin  to 500 mg in the morning and 1000 mg in the evening. - Encouraged increased water intake. - Continue Farxiga for kidney protection and diabetes management.  Hyperlipidemia associated with type 2 diabetes mellitus Drug Induced Myopathy Triglycerides remain elevated, likely due to genetic factors. Statins not tolerated, Zetia  used but not very effective. Repatha considered as an alternative. - Coordinate with pharmacist to explore Repatha as an alternative to Zetia . - Plan additional cholesterol testing to assess LDL levels for insurance coverage of Repatha.  Centrilobular emphysema Intermittent cough, particularly at night, with occasional congestion. Previous treatment with Tessalon  Perles was effective. - Prescribed Tessalon  Perles for cough management. - Prescribed Z-Pak as a backup if symptoms  worsen.       Referral to [facility] Pharm[facility] - [facility] CPP Consider Repatha, will need direct LDL Drug Myopathy On Zetia    Orders Placed This Encounter  Procedures   AMB Referral [facility] Care Management    Referral Priority:   Routine    Referral Type:   Consultation    Referral Reason:   Care Coordination    Number of Visits Requested:   1    Meds ordered this encounter  Medications   metFORMIN  (GLUCOPHAGE ) 500 MG tablet    Sig: Take 1 tablet (500 mg total) by mouth with breakfast and take 2 tablet (1000mg ) with supper    Dispense:  270 tablet    Refill:  1   lisinopril  (ZESTRIL ) 5 MG tablet    Sig: Take 1 tablet (5 mg total) by mouth daily.    Dispense:  90 tablet  Refill:  1   traZODone  (DESYREL ) 50 MG tablet    Sig: Take 1 tablet (50 mg total) by mouth at bedtime as needed. for sleep    Dispense:  90 tablet    Refill:  1   benzonatate  (TESSALON ) 100 MG capsule    Sig: Take 1 capsule (100 mg total) by mouth 3 (three) times daily as needed for cough.    Dispense:  30 capsule    Refill:  0   azithromycin  (ZITHROMAX  Z-PAK) 250 MG tablet    Sig: Take 2 tabs (500mg  total) on Day 1. Take 1 tab (250mg ) daily for next 4 days.    Dispense:  6 tablet    Refill:  0     Follow up plan: Return for 6 month DM A1c, CKD IV, updates.  Marsa Officer, DO [facility] 11/13/2024, 9:06 AM  "

## 2024-11-13 NOTE — Patient Instructions (Addendum)
 Thank you for coming to the office today.  Start Tessalon  Perls take 1 capsule up to 3 times a day as needed for cough  If not improving Start Azithromycin  Z pak (antibiotic) 2 tabs day 1, then 1 tab x 4 days, complete entire course even if improved  We will setup with Sharyle our clinical pharmacist to discuss self injection Repatha for cholesterol  You will need another LDL blood test before.  Refilled medications  Keep improving water intake.  Keep up with Dr Dennise  Please schedule a Follow-up Appointment to: Return for 6 month DM A1c, CKD IV, updates.  If you have any other questions or concerns, please feel free to call the office or send a message through MyChart. You may also schedule an earlier appointment if necessary.  Additionally, you may be receiving a survey about your experience at our office within a few days to 1 week by e-mail or mail. We value your feedback.  Marsa Officer, DO [facility], [place]

## 2024-11-17 ENCOUNTER — Telehealth: Payer: Self-pay

## 2024-11-17 MED ORDER — PREDNISONE 10 MG PO TABS: ORAL_TABLET | ORAL | 0 refills | Status: DC

## 2024-11-17 NOTE — Progress Notes (Signed)
 Care Guide Pharmacy Note  11/17/2024 Name: Cassidy Norris MRN: [id number] DOB: [birth year]  Referred By: Edman Marsa PARAS, DO Reason for referral: Complex Care Management (Outreach to schedule with pharm d )   Cassidy Norris is a 72 y.o. year old female who is a primary care patient of Edman, Marsa PARAS, DO.  [name] was referred to the pharmacist for assistance related to: DMII  Successful contact was made with the patient to discuss pharmacy services including being ready for the pharmacist to call at least 5 minutes before the scheduled appointment time and to have medication bottles and any blood pressure readings ready for review. The patient agreed to meet with the pharmacist via telephone visit on (date/time).11/20/2024  Jeoffrey Buffalo , RMA     [place], [facility] Guide  Direct Dial: [phone]  Website: [email or web address]

## 2024-11-20 ENCOUNTER — Other Ambulatory Visit: Payer: Self-pay | Admitting: Family Medicine

## 2024-11-20 ENCOUNTER — Other Ambulatory Visit: Admitting: Pharmacist

## 2024-11-20 ENCOUNTER — Other Ambulatory Visit (HOSPITAL_COMMUNITY): Payer: Self-pay

## 2024-11-20 ENCOUNTER — Encounter: Payer: Self-pay | Admitting: Pharmacist

## 2024-11-20 ENCOUNTER — Telehealth: Payer: Self-pay | Admitting: Pharmacy Technician

## 2024-11-20 ENCOUNTER — Other Ambulatory Visit: Payer: Self-pay | Admitting: Pharmacist

## 2024-11-20 DIAGNOSIS — E1122 Type 2 diabetes mellitus with diabetic chronic kidney disease: Secondary | ICD-10-CM

## 2024-11-20 DIAGNOSIS — E781 Pure hyperglyceridemia: Secondary | ICD-10-CM

## 2024-11-20 DIAGNOSIS — N184 Chronic kidney disease, stage 4 (severe): Secondary | ICD-10-CM

## 2024-11-20 DIAGNOSIS — I700 Atherosclerosis of aorta: Secondary | ICD-10-CM

## 2024-11-20 DIAGNOSIS — E1169 Type 2 diabetes mellitus with other specified complication: Secondary | ICD-10-CM

## 2024-11-20 DIAGNOSIS — Z7984 Long term (current) use of oral hypoglycemic drugs: Secondary | ICD-10-CM

## 2024-11-20 NOTE — Progress Notes (Unsigned)
 "  11/20/2024 Name: Cassidy Norris MRN: [id number] DOB: [birth year]  Chief Complaint  Patient presents with   Medication Management    Cassidy Norris is a 73 y.o. year old female who presented for a telephone visit.   They were referred to the pharmacist by their PCP for assistance in managing diabetes and medication access.    Subjective:  Care Team: Primary Care Provider: Edman Marsa PARAS, Cassidy Norris ; Next Scheduled Visit: 05/15/2025 Nephrologist: Cassidy Capri, Cassidy Norris; Next Scheduled Visit: 05/08/2025 Oncologist: Cassidy Cassidy PARAS, Cassidy Norris; Next Scheduled Visit: 12/08/2024 Neurologist: Cassidy Juliene SAUNDERS, Cassidy Norris; Next Scheduled Visit: 12/28/2024 Cardiologist: Cassidy Cage, Cassidy Norris   Medication Access/Adherence  Current [facility]:  [facility] [phone] [place], [place] - [address] [place] [address] Phone: [phone] Fax: [phone]  Cassidy [facility] - [facility] [address] [place] [address] Phone: [phone] Fax: [phone]   Patient reports affordability concerns with their medications: No  Patient reports access/transportation concerns to their Norris: No  Patient reports adherence concerns with their medications:  No    Reports completed azithromycin  course as prescribed by PCP, but still having cough when lays down to sleep at night - Denies having yet started prednisone  course as prescribed by PCP on 12/26 as wanting to first check with Oncology Pharmacist - has left a message and is awaiting a call back  Reports working on increasing hydration as recommended by her providers  Patient shares that she is enrolled in a grant [facility] the [facility] that takes care of her medication cost annually (meets the maximum out of pocket limit due to cost of oncology medications)   Diabetes:  Current medications:  - Metformin  500 mg - 1 tablet daily with breakfast and 1 tablet daily with supper Note current prescription written for 3  tablets/day, but patient reports that she forgot to make this change - Farxiga 10 mg daily   Denies checking blood sugar at home recently  Reports tries to limit intake of sweets and bread, but reports difficult to limit pastas  Macrovascular and Microvascular Risk Reduction:  Statin? no; patient previously intolerant to statin therapy; ACEi/ARB? yes (lisinopril  5 mg daily) Last urinary albumin/creatinine ratio:  Lab Results  Component Value Date   MICRALBCREAT 21 11/01/2024   MICRALBCREAT 12 06/21/2023   MICRALBCREAT 5 10/29/2022   MICRALBCREAT <30 01/12/2017   Last eye exam:  Lab Results  Component Value Date   HMDIABEYEEXA No Retinopathy 11/26/2023   Last foot exam: 11/13/2024 Tobacco Use:  Tobacco Use: Medium Risk (11/13/2024)   Patient History    Smoking Tobacco Use: Former    Smokeless Tobacco Use: Former    Passive Exposure: Not on file    Hyperlipidemia/ASCVD Risk Reduction  Current lipid lowering medications: ezetimibe  10 mg daily + krill oil Medications tried in the past: atorvastatin  (leg muscle pain)   ASCVD History: Aortic atherosclerosis (noted on imaging in chart)  Current physical activity: Reports limited recently by feeling sick, but typically takes her dog for short walks 2-3 times/day.   Hypertension:  Current medications:  - lisinopril  5 mg daily - metoprolol  25 mg - 1/2 tablet (12.5 mg) twice daily  Patient has an automated, upper arm home BP cuff Denies checking blood sugar at home recently  Current physical activity: Reports limited recently by feeling sick, but typically takes her dog for short walks 2-3 times/day.    Objective:  Lab Results  Component Value Date   HGBA1C 6.2 (  H) 11/01/2024    Lab Results  Component Value Date   CREATININE 1.87 (H) 11/09/2024   BUN 32 (H) 11/09/2024   NA 146 (H) 11/09/2024   K 4.4 11/09/2024   CL 107 11/09/2024   CO2 24 11/09/2024    Lab Results  Component Value Date   CHOL 189  11/01/2024   HDL 46 (L) 11/01/2024   LDLCALC  11/01/2024     Comment:     . LDL cholesterol not calculated. Triglyceride levels greater than 400 mg/dL invalidate calculated LDL results. . Reference range: <100 . Desirable range <100 mg/dL for primary prevention;   <70 mg/dL for patients with CHD or diabetic patients  with > or = 2 CHD risk factors. [name] LDL-C is now calculated using the Martin-Hopkins  calculation, which is a validated novel method providing  better accuracy than the Friedewald equation in the  estimation of LDL-C.  [name] et al. [name]. 7986;689(80): 2061-2068  (http://education.[facility].com/faq/FAQ164)    LDLDIRECT 133 (H) 11/29/2023   TRIG 477 (H) 11/01/2024   CHOLHDL 4.1 11/01/2024   BP Readings from Last 3 Encounters:  11/13/24 130/70  11/09/24 130/80  10/12/24 122/82   Pulse Readings from Last 3 Encounters:  11/13/24 94  11/09/24 95  10/12/24 72     Medications Reviewed Today     Reviewed by Cassidy Norris, Cassidy Norris (Pharmacist) on 11/20/24 at [phone]  Med List Status: <None>   Medication Order Taking? Sig Documenting Provider Last Dose Status Informant  acetaminophen  (TYLENOL ) 500 MG tablet [id number] Yes Take 1,000 mg by mouth 2 (two) times daily as needed for moderate pain or headache. Provider, Historical, Cassidy Norris  Active Self    Discontinued 11/20/24 [phone] (Completed Course)   baclofen  (LIORESAL ) 10 MG tablet [id number] Yes TAKE 1 TABLET BY MOUTH THREE TIMES A DAY AS NEEDED FOR MUSCLE SPASM Cassidy, Adam R, Cassidy Norris  Active   benzonatate  (TESSALON ) 100 MG capsule [id number] Yes Take 1 capsule (100 mg total) by mouth 3 (three) times daily as needed for cough. Karamalegos, Marsa PARAS, Cassidy Norris  Active   calcium  carbonate (OSCAL) 1500 (600 Ca) MG TABS tablet [id number] Yes Take by mouth 2 (two) times daily with a meal. Provider, Historical, Cassidy Norris  Active Self  dapagliflozin propanediol (FARXIGA) 10 MG TABS tablet [id number] Yes Take 10 mg by mouth daily. Provider,  Historical, Cassidy Norris  Active   diclofenac  Sodium (VOLTAREN ) 1 % GEL [id number]  Apply 2 g topically 3 (three) times daily as needed.  Patient not taking: Reported on 11/20/2024   Cassidy Marsa PARAS, Cassidy Norris  Active Self  ezetimibe  (ZETIA ) 10 MG tablet [id number] Yes Take 1 tablet (10 mg total) by mouth daily. Cassidy Marsa PARAS, Cassidy Norris  Active   fluticasone  (FLONASE ) 50 MCG/ACT nasal spray [id number] Yes PLACE 2 SPRAYS INTO BOTH NOSTRILS AT BEDTIME. Cassidy Marsa PARAS, Cassidy Norris  Active Self  fulvestrant  (FASLODEX ) 250 MG/5ML injection [id number] Yes Inject 500 mg into the muscle every 30 (thirty) days. One injection each buttock over 1-2 minutes. Warm prior to use. Provider, Historical, Cassidy Norris  Active Self  Krill Oil 1000 MG CAPS [id number] Yes Take 1,000 mg by mouth daily. Provider, Historical, Cassidy Norris  Active Self  lisinopril  (ZESTRIL ) 5 MG tablet [id number] Yes Take 1 tablet (5 mg total) by mouth daily. Cassidy Marsa PARAS, Cassidy Norris  Active   loperamide  (IMODIUM ) 2 MG capsule [id number] Yes TAKE 2 TABLETS BY MOUTH 4 TIMES A DAY DAILY AS NEEDED FOR DIARRHEA OR LOOSE STOOLS. Cassidy Cassidy PARAS, Cassidy Norris  Active  metFORMIN  (GLUCOPHAGE ) 500 MG tablet [id number] Yes Take 1 tablet (500 mg total) by mouth with breakfast and take 2 tablet (1000mg ) with supper  Patient taking differently: Take 500 mg by mouth 2 (two) times daily with a meal. Take 1 tablet (500 mg total) by mouth with breakfast and take 2 tablet (1000mg ) with supper   Cassidy Marsa PARAS, Cassidy Norris  Active   metoprolol  tartrate (LOPRESSOR ) 25 MG tablet [id number] Yes TAKE 1/2 TABLET TWICE A DAY BY MOUTH Darron Cassidy LABOR, Cassidy Norris  Active   omeprazole  (PRILOSEC) 40 MG capsule [id number] Yes TAKE 1 CAPSULE BY MOUTH TWICE A DAY Cassidy, Cassidy PARAS, Cassidy Norris  Active   ondansetron  (ZOFRAN ) 8 MG tablet [id number] Yes TAKE 1 TABLET BY MOUTH EVERY 8 HOURS AS NEEDED FOR NAUSEA AND VOMITING Cassidy Cassidy PARAS, Cassidy Norris  Active   predniSONE  (DELTASONE ) 10 MG tablet [id number]  Take 6 tabs with  breakfast Day 1, 5 tabs Day 2, 4 tabs Day 3, 3 tabs Day 4, 2 tabs Day 5, 1 tab Day 6. Cassidy Marsa PARAS, Cassidy Norris  Active    Patient not taking:   Discontinued 11/20/24 0943 (Patient Preference)   ribociclib  succ (KISQALI  400MG  DAILY DOSE) 200 MG Therapy Pack [id number] Yes Take 2 tablets (400 mg total) by mouth daily. Take for 21 days on, 7 days off, repeat every 28 days. Finnegan, Timothy J, Cassidy Norris  Active   sodium chloride  (OCEAN) 0.65 % SOLN nasal spray [id number] Yes Place 1 spray into both nostrils as needed for congestion. Provider, Historical, Cassidy Norris  Active   traZODone  (DESYREL ) 50 MG tablet [id number] Yes Take 1 tablet (50 mg total) by mouth at bedtime as needed. for sleep Cassidy Marsa PARAS, Cassidy Norris  Active   venlafaxine  XR (EFFEXOR -XR) 75 MG 24 hr capsule [id number] Yes Take 1 capsule (75 mg total) by mouth daily with breakfast. Cassidy Marsa PARAS, Cassidy Norris  Active   vitamin B-12 (CYANOCOBALAMIN ) 1000 MCG tablet [id number] Yes Take 2,500 mcg by mouth every Monday, Wednesday, and Friday. Provider, Historical, Cassidy Norris  Active Self  Vitamin D , Cholecalciferol , 50 MCG (2000 UT) CAPS [id number] Yes Take 2,000 Units by mouth every Monday, Wednesday, and Friday. Provider, Historical, Cassidy Norris  Active Self  Med List Note Teretha Renaee SAILOR, Cassidy Norris 11/25/23 9064): Kisqali  filled at [facility] (Specialty)              Assessment/Plan:   Comprehensive medication review performed; medication list updated in electronic medical record  - Per metformin  package labeling, for eGFR 30 to 45, maximum dose recommendation: 500 mg twice daily with close monitoring of kidney function Recommended to stop metformin  with eGFR <30 Patient attributes recent reduced renal function to poor hydration on her part. Notes that she is scheduled for upcoming labs, including renal function check with Oncology on 12/08/2024 Will collaborate with PCP  Diabetes: - Currently controlled; goal A1c <7%. Blood pressure is at goal  <130/80.  - Recommend to monitor home blood sugar, keep log of results and have this record to review at upcoming medical appointments. Patient to contact provider office sooner if needed for readings outside of established parameters or symptoms   Hyperlipidemia/ASCVD Risk Reduction: - Currently uncontrolled.  - Reviewed Norris term complications of uncontrolled cholesterol - Recommend switch from OTC krill oil to prescription Vascepa 2 grams twice daily for Cardiovascular risk reduction with hypertriglyceridemia Collaborated with PCP and order for generic Icosapent Ethyl capsules sent to Norris for patient Patient verbalizes understanding of plan to STOP krill oil and instead START  icosapent ethyl 2 grams twice daily with meals - Discuss dietary changes, including reducing intake of carbohydrates, reducing intake of saturated and trans fats, including selecting leaner meats and selecting reduced fat options when consuming dairy products - Schedule appointment for patient to return to office for direct LDL lab tomorrow (was unable to calculate LDL with latest lipid panel due to triglycerides >400 Note, if LDL >goal, patient interested in discussing if appropriate to start Repatha for cholesterol control/ASCVD risk reduction Will send patient information regarding Repatha Sureclick via MyChart as requested    Follow Up Plan: Clinical Pharmacist will follow up with patient by telephone on 11/27/2024 at 8:30 AM   Sharyle Sia, PharmD, JAQUELINE, CPP Clinical Pharmacist [facility] [phone]    "

## 2024-11-20 NOTE — Telephone Encounter (Signed)
 Oral Oncology Patient Advocate Encounter  Called and let the patient know that there are no interactions between prednisone  and kisqali  per the pharmacist. Advised her to call the clinic if she has any new symptoms that develop while taking both drugs.  Patient verbalized understanding.   Lilie Vezina (Patty) Chet Burnet, CPhT  [facility], [facility], [facility] - Oral Chemotherapy Patient Advocate Specialist III Phone: [phone]  Fax: [phone]

## 2024-11-20 NOTE — Patient Instructions (Signed)
 It was a pleasure speaking with you today. We have you scheduled for your lab appointment tomorrow, 11/21/2024 at 9:00 AM to check that direct LDL.   Information Regarding Repatha:   Repatha is used to lower LDL (bad cholesterol)   The device is a single-dose disposable pen. This pen can only be used for 1 single injection, and must be discarded after use   The medicine is injected under your skin and can be given by yourself or someone else (caregiver)   The most common side effects of Repatha include side effects of runny nose, sore throat, symptoms of the common cold, flu or flu-like symptoms, back pain and redness, pain, or bruising at the injection site   Tell your healthcare provider if you have any side effect that bothers you or that does not go away.    Stop taking Repatha and call your healthcare provider or seek emergency help right away if you have any of these symptoms: trouble breathing or swallowing, raised bumps (hives), rash or itching, swelling of the face, lips, tongue, throat or arms   In the meantime please review the following How to Use video and Instruction Handout at the following links below and please let me know what questions you have about the medication:   fuelmenu.no   https://www.pi.amgen.com/-/media/Project/Amgen/Repository/pi-amgen-com/Repatha/repatha_ifu_hcp_pt_english.pdf   I look forward to speaking with you again by telephone on 11/27/2024 at 8:30 AM    Thank you!   Sharyle Sia, PharmD, JAQUELINE, CPP Clinical Pharmacist [facility] [phone]

## 2024-11-21 ENCOUNTER — Other Ambulatory Visit

## 2024-11-22 LAB — LDL CHOLESTEROL, DIRECT: Direct LDL: 88 mg/dL

## 2024-11-22 MED ORDER — ICOSAPENT ETHYL 1 G PO CAPS: 2.0000 g | ORAL_CAPSULE | Freq: Two times a day (BID) | ORAL | 5 refills | Status: AC

## 2024-11-24 ENCOUNTER — Other Ambulatory Visit: Payer: Self-pay | Admitting: Pharmacist

## 2024-11-24 DIAGNOSIS — E1169 Type 2 diabetes mellitus with other specified complication: Secondary | ICD-10-CM

## 2024-11-24 NOTE — Progress Notes (Signed)
" °  Chronic Care Management   Note  11/24/2024 Name: Cassidy Norris MRN: [id number] DOB: Jan 03, 1951   Based on review of latest direct LDL result, LDL> goal.   Lab Results  Component Value Date   CHOL 189 11/01/2024   HDL 46 (L) 11/01/2024   LDLCALC  11/01/2024     Comment:     . LDL cholesterol not calculated. Triglyceride levels greater than 400 mg/dL invalidate calculated LDL results. . Reference range: <100 . Desirable range <100 mg/dL for primary prevention;   <70 mg/dL for patients with CHD or diabetic patients  with > or = 2 CHD risk factors. [name] LDL-C is now calculated using the Martin-Hopkins  calculation, which is a validated novel method providing  better accuracy than the Friedewald equation in the  estimation of LDL-C.  [name] et al. [name]. 7986;689(80): 2061-2068  (http://education.[facility].com/faq/FAQ164)    LDLDIRECT 88 11/21/2024   TRIG 477 (H) 11/01/2024   CHOLHDL 4.1 11/01/2024    Have discussed with PCP and patient plan to start PCSK9 inhibitor therapy if LDL uncontrolled on ezetimibe . Note patient has history of statin intolerance (leg muscle pain with atorvastatin ).    Today find that Praluent, rather than Repatha is preferred PCSK9 inhibitor through patient's [facility] Medicare plan in 2026. PA required.   Submit prior authorization request on behalf of patient for Praluent via CoverMyMeds today.  Sharyle Sia, PharmD, JAQUELINE, CPP Clinical Pharmacist [facility] [phone]  "

## 2024-11-27 ENCOUNTER — Other Ambulatory Visit: Admitting: Pharmacist

## 2024-11-27 ENCOUNTER — Other Ambulatory Visit: Payer: Self-pay

## 2024-11-27 ENCOUNTER — Encounter: Payer: Self-pay | Admitting: Pharmacist

## 2024-11-27 ENCOUNTER — Other Ambulatory Visit (HOSPITAL_COMMUNITY): Payer: Self-pay

## 2024-11-27 DIAGNOSIS — E785 Hyperlipidemia, unspecified: Secondary | ICD-10-CM

## 2024-11-27 DIAGNOSIS — N184 Chronic kidney disease, stage 4 (severe): Secondary | ICD-10-CM

## 2024-11-27 DIAGNOSIS — E1169 Type 2 diabetes mellitus with other specified complication: Secondary | ICD-10-CM

## 2024-11-27 DIAGNOSIS — Z7984 Long term (current) use of oral hypoglycemic drugs: Secondary | ICD-10-CM

## 2024-11-27 MED ORDER — ONETOUCH VERIO FLEX SYSTEM W/DEVICE KIT: PACK | 0 refills | Status: AC

## 2024-11-27 MED ORDER — ONETOUCH DELICA PLUS LANCET30G MISC: 3 refills | Status: AC

## 2024-11-27 MED ORDER — ONETOUCH VERIO VI STRP: ORAL_STRIP | 3 refills | Status: AC

## 2024-11-27 MED ORDER — PRALUENT 75 MG/ML ~~LOC~~ SOAJ: 75.0000 mg | SUBCUTANEOUS | 2 refills | Status: AC

## 2024-11-27 NOTE — Progress Notes (Signed)
 Specialty Pharmacy Refill Coordination Note  Cassidy Norris is a 74 y.o. female contacted today regarding refills of specialty medication(s) Ribociclib  Succinate (KISQALI  400mg  Daily Dose)   Patient requested Delivery   Delivery date: 12/01/24   Verified address: [address], [place] [address]   Medication will be filled on: 11/30/24

## 2024-11-27 NOTE — Patient Instructions (Signed)
 Your [facility] Medicare plan prefers the PCSK9 Inhibitor Praluent , rather than Repatha.   Praluent  is used to lower LDL (bad cholesterol). It is an injection that is given under the skin every 2 weeks.   The device is a single-dose disposable pen. This pen can only be used for 1 single injection, and must be discarded after use   The medicine is injected under your skin and can be given by yourself or someone else (caregiver)   The most common side effects of Praluent  include redness, itching, swelling, or pain/tenderness at the injection site; flu or flu-like symptoms; diarrhea; and muscle pain. Tell your healthcare provider if you have any side effect that bothers you or that does not go away.    Stop taking Praluent  and call your healthcare provider or seek emergency help right away if you have any of these symptoms: trouble breathing or swallowing, raised bumps (hives), rash or itching, swelling of the face, lips, tongue, throat or arms   In the meantime please review the following How to Use video and Instruction Handout at the following links below and please let me know what questions you have about the medication:   How to Use  PRALUENT  (alirocumab ) Injection   Pre-filled-Pen-Quick-Reference-Guide-English.pdf   Sharyle Sia, PharmD, JAQUELINE, CPP Clinical Pharmacist [facility] [phone]

## 2024-11-27 NOTE — Progress Notes (Signed)
 "  11/27/2024 Name: Cassidy Norris MRN: [id number] DOB: [birth year]  Chief Complaint  Patient presents with   Medication Management   Medication Assistance    Cassidy Norris is a 74 y.o. year old female who presented for a telephone visit.   They were referred to the pharmacist by their PCP for assistance in managing diabetes and medication access.      Subjective:   Care Team: Primary Care Provider: Edman Marsa PARAS, DO ; Next Scheduled Visit: 05/15/2025 Nephrologist: Dennise Capri, MD; Next Scheduled Visit: 05/08/2025 Oncologist: Jacobo Evalene PARAS, MD; Next Scheduled Visit: 12/08/2024 Neurologist: Skeet Juliene SAUNDERS, DO; Next Scheduled Visit: 12/28/2024 Cardiologist: Deatrice Cage, MD   Medication Access/Adherence  [facility]acy:  [facility] [phone] [place] [place], [place] - [address] [place] [address] Phone: [phone] Fax: [phone]  DARRYLE LONG - [facility] [address] [place] [address] Phone: [phone] Fax: [phone]   Patient reports affordability concerns with their medications: No  Patient reports access/transportation concerns to their pharmacy: No  Patient reports adherence concerns with their medications:  No      Patient shared that she is enrolled in a grant [facility] the [facility] that takes care of her medication cost annually (meets the maximum out of pocket limit due to cost of oncology medications)     Diabetes:   Current medications:  - Metformin  500 mg - 1 tablet daily with breakfast and 1 tablet daily with supper - Farxiga 10 mg daily     Denies checking blood sugar at home recently. Reports needing new glucometer that is covered through her Medicare      Hyperlipidemia/ASCVD Risk Reduction   Current lipid lowering medications: ezetimibe  10 mg daily + icosapent  Ethyl  2 grams twice daily with meals Medications tried in the past: atorvastatin  (leg muscle pain)   Submitted prior  authorization request on behalf of patient for Praluent  via CoverMyMeds on 11/24/2024 - PA was approved - Follow up with patient today to provide this update    ASCVD History: Aortic atherosclerosis (noted on imaging in chart)   Current physical activity: Typically takes her dog for short walks 2-3 times/day.    Objective:  Lab Results  Component Value Date   HGBA1C 6.2 (H) 11/01/2024    Lab Results  Component Value Date   CREATININE 1.87 (H) 11/09/2024   BUN 32 (H) 11/09/2024   NA 146 (H) 11/09/2024   K 4.4 11/09/2024   CL 107 11/09/2024   CO2 24 11/09/2024    Lab Results  Component Value Date   CHOL 189 11/01/2024   HDL 46 (L) 11/01/2024   LDLCALC  11/01/2024     Comment:     . LDL cholesterol not calculated. Triglyceride levels greater than 400 mg/dL invalidate calculated LDL results. . Reference range: <100 . Desirable range <100 mg/dL for primary prevention;   <70 mg/dL for patients with CHD or diabetic patients  with > or = 2 CHD risk factors. [name] LDL-C is now calculated using the Martin-Hopkins  calculation, which is a validated novel method providing  better accuracy than the Friedewald equation in the  estimation of LDL-C.  Cassidy Norris et al. [name]. 7986;689(80): 2061-2068  (http://education.[facility].com/faq/FAQ164)    LDLDIRECT 88 11/21/2024   TRIG 477 (H) 11/01/2024   CHOLHDL 4.1 11/01/2024    Medications Reviewed Today     Reviewed by Alana Sharyle LABOR, RPH-CPP (Pharmacist) on 11/27/24 at 0901  Med List Status: <  None>   Medication Order Taking? Sig Documenting Provider Last Dose Status Informant  acetaminophen  (TYLENOL ) 500 MG tablet [id number]  Take 1,000 mg by mouth 2 (two) times daily as needed for moderate pain or headache. [provider]  Active Self  baclofen  (LIORESAL ) 10 MG tablet [id number]  TAKE 1 TABLET BY MOUTH THREE TIMES A DAY AS NEEDED FOR MUSCLE SPASM Skeet, Adam R, DO  Active   benzonatate  (TESSALON ) 100 MG capsule  [id number]  Take 1 capsule (100 mg total) by mouth 3 (three) times daily as needed for cough. Edman Marsa PARAS, DO  Active   Blood Glucose Monitoring Suppl (ONETOUCH VERIO FLEX SYSTEM) w/Device KIT [id number] Yes Use to check blood sugar up to twice daily as directed Edman Marsa PARAS, DO  Active   calcium  carbonate (OSCAL) 1500 (600 Ca) MG TABS tablet [id number]  Take by mouth 2 (two) times daily with a meal. [provider]  Active Self  dapagliflozin propanediol (FARXIGA) 10 MG TABS tablet [id number]  Take 10 mg by mouth daily. [provider]  Active   diclofenac  Sodium (VOLTAREN ) 1 % GEL [id number]  Apply 2 g topically 3 (three) times daily as needed.  Patient not taking: Reported on 11/20/2024   Edman Marsa PARAS, DO  Active Self  ezetimibe  (ZETIA ) 10 MG tablet [id number]  Take 1 tablet (10 mg total) by mouth daily. Edman Marsa PARAS, DO  Active   fluticasone  (FLONASE ) 50 MCG/ACT nasal spray [id number]  PLACE 2 SPRAYS INTO BOTH NOSTRILS AT BEDTIME. Edman Marsa PARAS, DO  Active Self  fulvestrant  (FASLODEX ) 250 MG/5ML injection [id number]  Inject 500 mg into the muscle every 30 (thirty) days. One injection each buttock over 1-2 minutes. Warm prior to use. [provider]  Active Self  glucose blood (ONETOUCH VERIO) test strip [id number] Yes Use to check blood sugar up to twice daily Edman Marsa PARAS, DO  Active   icosapent  Ethyl (VASCEPA ) 1 g capsule [id number]  Take 2 capsules (2 g total) by mouth 2 (two) times daily with a meal. Edman, Marsa PARAS, DO  Active   Lancets [facility] DELICA PLUS [place]) MISC [id number] Yes Use to check blood sugar up to twice daily Edman Marsa PARAS, DO  Active   lisinopril  (ZESTRIL ) 5 MG tablet [id number]  Take 1 tablet (5 mg total) by mouth daily. Edman Marsa PARAS, DO  Active   loperamide  (IMODIUM ) 2 MG capsule [id number]  TAKE 2 TABLETS BY MOUTH 4 TIMES A DAY DAILY AS NEEDED FOR  DIARRHEA OR LOOSE STOOLS. Jacobo Evalene PARAS, MD  Active   metFORMIN  (GLUCOPHAGE ) 500 MG tablet [id number]  Take 1 tablet (500 mg total) by mouth 2 (two) times daily with a meal. Renal dosing. Edman Marsa PARAS, DO  Active   metoprolol  tartrate (LOPRESSOR ) 25 MG tablet [id number]  TAKE 1/2 TABLET TWICE A DAY BY MOUTH Darron Deatrice LABOR, MD  Active   omeprazole  (PRILOSEC) 40 MG capsule [id number]  TAKE 1 CAPSULE BY MOUTH TWICE A DAY Finnegan, Evalene PARAS, MD  Active   ondansetron  (ZOFRAN ) 8 MG tablet [id number]  TAKE 1 TABLET BY MOUTH EVERY 8 HOURS AS NEEDED FOR NAUSEA AND VOMITING Finnegan, Timothy J, MD  Active   predniSONE  (DELTASONE ) 10 MG tablet [id number]  Take 6 tabs with breakfast Day 1, 5 tabs Day 2, 4 tabs Day 3, 3 tabs Day 4, 2 tabs Day 5, 1 tab Day 6. Karamalegos, Marsa PARAS, DO  Active   ribociclib  succ (KISQALI  400MG  DAILY DOSE) 200 MG  Therapy Pack [id number]  Take 2 tablets (400 mg total) by mouth daily. Take for 21 days on, 7 days off, repeat every 28 days. Jacobo Evalene PARAS, MD  Active   sodium chloride  (OCEAN) 0.65 % SOLN nasal spray [id number]  Place 1 spray into both nostrils as needed for congestion. [provider]  Active   traZODone  (DESYREL ) 50 MG tablet [id number]  Take 1 tablet (50 mg total) by mouth at bedtime as needed. for sleep Edman Marsa PARAS, DO  Active   venlafaxine  XR (EFFEXOR -XR) 75 MG 24 hr capsule [id number]  Take 1 capsule (75 mg total) by mouth daily with breakfast. Edman Marsa PARAS, DO  Active   vitamin B-12 (CYANOCOBALAMIN ) 1000 MCG tablet [id number]  Take 2,500 mcg by mouth every Monday, Wednesday, and Friday. [provider]  Active Self  Vitamin D , Cholecalciferol , 50 MCG (2000 UT) CAPS [id number]  Take 2,000 Units by mouth every Monday, Wednesday, and Friday. [provider]  Active Self  Med List Note Teretha Renaee SAILOR, RPH-CPP 11/25/23 9064): Kisqali  filled at [facility]               Assessment/Plan:   Diabetes: - Currently controlled; goal A1c <7%. Blood pressure is at goal <130/80.  - [facility] orders for new glucometer [facility] - Recommend to monitor home blood sugar, keep log of results and have this record to review at upcoming medical appointments. Patient to contact provider office sooner if needed for readings outside of established parameters or symptoms     Hyperlipidemia/ASCVD Risk Reduction: - Currently uncontrolled.  - Reviewed long term complications of uncontrolled cholesterol - Discuss dietary changes, including reducing intake of carbohydrates, reducing intake of saturated and trans fats, including selecting leaner meats and selecting reduced fat options when consuming dairy products - Counseled on Praluent , including mechanism of action, side effects, and benefits. Counseled on potential side effects including redness, itching, swelling, or pain/tenderness at the injection site; flu or flu-like symptoms; diarrhea; and muscle pain. Advised to contact our office with if experiences any side effect that are bothersome or do not go away. Advise patient to seek emergency care if experiences any symptoms of allergic reaction, such as trouble breathing or swallowing, raised bumps (hives), rash or itching, swelling of the face, lips, tongue, throat or arms. Patient verbalized understanding. Send patient the link for the How to Use video as well as manufacturer Quick Reference Guide Handout via MyChart - Will collaborate with PCP to recommend starting patient on Praluent  75 mg once every 2 weeks with plan to recheck lipid panel within 1-3 months      Follow Up Plan: Clinical Pharmacist will follow up with patient by telephone on 12/25/2024 at 8:30 AM     Sharyle Sia, PharmD, JAQUELINE, CPP Clinical Pharmacist [facility] [phone]   "

## 2024-11-28 ENCOUNTER — Other Ambulatory Visit: Payer: Self-pay

## 2024-11-28 ENCOUNTER — Other Ambulatory Visit: Payer: Self-pay | Admitting: Family Medicine

## 2024-11-28 ENCOUNTER — Other Ambulatory Visit: Payer: Medicare Other

## 2024-11-28 DIAGNOSIS — E1169 Type 2 diabetes mellitus with other specified complication: Secondary | ICD-10-CM

## 2024-11-28 NOTE — Progress Notes (Signed)
 Clinical Intervention Note  Clinical Intervention Notes: Patient reported using a ZPak, short course of prednisone  (gathered from chart) and Vascepa . No DDIs identified with Kisqali , prednisone , and Vascepa . Per Micromedex, Kisqali  taken with azithromycin  may increase the QT interval. Patient has completed course of antibiotics with no problems.   Clinical Intervention Outcomes: Prevention of an adverse drug event   Advertising Account Planner

## 2024-11-28 NOTE — Telephone Encounter (Signed)
 Copied from CRM [id number]. Topic: Clinical - Medication Refill >> Nov 28, 2024  1:49 PM Wess RAMAN wrote: Medication: ezetimibe  (ZETIA ) 10 MG tablet  Has the patient contacted their pharmacy? Yes (Agent: If no, request that the patient contact the pharmacy for the refill. If patient does not wish to contact the pharmacy document the reason why and proceed with request.) (Agent: If yes, when and what did the pharmacy advise?) Call doctor  This is the patient's preferred pharmacy:  [id number] [place], [place] - [address] [place] [address] Phone: [phone] Fax: [phone]  Is this the correct pharmacy for this prescription? Yes If no, delete pharmacy and type the correct one.   Has the prescription been filled recently? Yes  Is the patient out of the medication? No  Has the patient been seen for an appointment in the last year OR does the patient have an upcoming appointment? Yes  Can we respond through MyChart? Yes  Agent: Please be advised that Rx refills may take up to 3 business days. We ask that you follow-up with your pharmacy.

## 2024-11-30 ENCOUNTER — Other Ambulatory Visit: Payer: Self-pay

## 2024-11-30 MED ORDER — EZETIMIBE 10 MG PO TABS: 10.0000 mg | ORAL_TABLET | Freq: Every day | ORAL | 3 refills | Status: AC

## 2024-11-30 NOTE — Telephone Encounter (Signed)
 Requested Prescriptions  Pending Prescriptions Disp Refills   ezetimibe  (ZETIA ) 10 MG tablet 90 tablet 3    Sig: Take 1 tablet (10 mg total) by mouth daily.     Cardiovascular:  Antilipid - Sterol Transport Inhibitors Failed - 11/30/2024 10:21 AM      Failed - Lipid Panel in normal range within the last 12 months    Cholesterol  Date Value Ref Range Status  11/01/2024 189 <200 mg/dL Final   Cholesterol Piccolo, [place]  Date Value Ref Range Status  01/12/2017 159 <200 mg/dL Final    Comment:                            Desirable                <200                         Borderline High      200- 239                         High                     >239    LDL Cholesterol (Calc)  Date Value Ref Range Status  11/01/2024  mg/dL (calc) Final    Comment:    . LDL cholesterol not calculated. Triglyceride levels greater than 400 mg/dL invalidate calculated LDL results. . Reference range: <100 . Desirable range <100 mg/dL for primary prevention;   <70 mg/dL for patients with CHD or diabetic patients  with > or = 2 CHD risk factors. [name] LDL-C is now calculated using the Martin-Hopkins  calculation, which is a validated novel method providing  better accuracy than the Friedewald equation in the  estimation of LDL-C.  [name] et al. [name]. 7986;689(80): 2061-2068  (http://education.[facility].com/faq/FAQ164)    Direct LDL  Date Value Ref Range Status  11/21/2024 88 <100 mg/dL Final    Comment:    Greatly elevated Triglycerides values (>1200 mg/dL) interfere with the dLDL assay. [name] Desirable range <100 mg/dL for primary prevention;   <70 mg/dL for patients with CHD or diabetic patients  with > or = 2 CHD risk factors. [name]    HDL  Date Value Ref Range Status  11/01/2024 46 (L) > OR = 50 mg/dL Final  89/93/7982 28 (L) >39 mg/dL Final   Triglycerides  Date Value Ref Range Status  11/01/2024 477 (H) <150 mg/dL Final    Comment:    . If a non-fasting specimen was  collected, consider repeat triglyceride testing on a fasting specimen if clinically indicated.  Veatrice et al. J. of Clin. Lipidol. 2015;9:129-169. .    Triglycerides Piccolo,Waived  Date Value Ref Range Status  01/12/2017 172 (H) <150 mg/dL Final    Comment:                            Normal                   <150                         Borderline High     150 - 199  High                200 - 499                         Very High                >499          Passed - AST in normal range and within 360 days    AST  Date Value Ref Range Status  11/09/2024 38 15 - 41 U/L Final         Passed - ALT in normal range and within 360 days    ALT  Date Value Ref Range Status  11/09/2024 34 0 - 44 U/L Final   SGPT (ALT)  Date Value Ref Range Status  03/11/2015 32 U/L Final    Comment:    14-54 NOTE: New Reference Range  01/29/15          Passed - Patient is not pregnant      Passed - Valid encounter within last 12 months    Recent Outpatient Visits           2 weeks ago Type 2 diabetes mellitus with stage 4 chronic kidney disease, without long-term current use of insulin  [facility])   [facility] [place], Marsa PARAS, DO   3 weeks ago Cough, unspecified type   [facility], [place] A, MD   7 months ago Type 2 diabetes mellitus with other specified complication, without long-term current use of insulin  [facility])   [place] [facility] Edman Marsa PARAS, DO   11 months ago Centrilobular emphysema [facility])   [place] [facility] Edman Marsa PARAS, DO       Future Appointments             In 9 months Hester Alm BROCKS, MD [facility]

## 2024-12-06 ENCOUNTER — Ambulatory Visit (INDEPENDENT_AMBULATORY_CARE_PROVIDER_SITE_OTHER): Admitting: Family Medicine

## 2024-12-06 ENCOUNTER — Ambulatory Visit
Admission: RE | Admit: 2024-12-06 | Discharge: 2024-12-06 | Disposition: A | Discharge status: Home / Self Care | Source: Ambulatory Visit | Attending: Family Medicine | Admitting: Family Medicine

## 2024-12-06 ENCOUNTER — Encounter: Payer: Self-pay | Admitting: Family Medicine

## 2024-12-06 ENCOUNTER — Ambulatory Visit: Payer: Self-pay | Admitting: Family Medicine

## 2024-12-06 VITALS — BP 122/80 | HR 87 | Ht 65.0 in | Wt 192.0 lb

## 2024-12-06 DIAGNOSIS — J40 Bronchitis, not specified as acute or chronic: Secondary | ICD-10-CM | POA: Insufficient documentation

## 2024-12-06 DIAGNOSIS — J329 Chronic sinusitis, unspecified: Secondary | ICD-10-CM

## 2024-12-06 DIAGNOSIS — J432 Centrilobular emphysema: Secondary | ICD-10-CM | POA: Diagnosis not present

## 2024-12-06 NOTE — Progress Notes (Signed)
 "  Subjective:    Patient ID: Cassidy Norris, female    DOB: 1951-01-15, 74 y.o.   MRN: [id number]  Cassidy Norris is a 74 y.o. female presenting on 12/06/2024 for Cough  Patient presents for a same day appointment.  HPI  Discussed the use of AI scribe software for clinical note transcription with the patient, who gave verbal consent to proceed.  History of Present Illness   The patient presents with persistent congestion and cough for one month.  Cough and upper respiratory symptoms - Congestion and intermittent cough for one month - Cough varies between dry and wet - Worsening of cough at night - Rattling noise with breathing - No significant fever; temperature elevated once to 98.626F (baseline 97.26F) - No recent testing for COVID, influenza, or RSV  Infectious concerns - Concerned about potential contagiousness, especially around brother-in-law with cardiac disease and elderly individuals at church  Prior treatments and medication use - Previously used Zofran , prednisone , and another unspecified medication without symptom improvement - Current medications include Kisqali  and recently started Praluent  - No prior use of daily inhalers - Reluctant to add additional medications  Medical history relevant to current illness - History of recurrent bronchitis and strep throat in childhood, no recent episodes - Difficulty managing multiple medical appointments for cancer and kidney disease       11/13/2024    8:36 AM 07/20/2024    8:22 AM 05/25/2024   10:25 AM  Depression screen PHQ 2/9  Decreased Interest 0 0 0  Down, Depressed, Hopeless 0 0 0  PHQ - 2 Score 0 0 0  Altered sleeping 1    Tired, decreased energy 0    Change in appetite 1    Feeling bad or failure about yourself  0    Trouble concentrating 0    Moving slowly or fidgety/restless 0    Suicidal thoughts 0    PHQ-9 Score 2    Difficult doing work/chores Not difficult at all         11/13/2024    8:36 AM  05/02/2024    8:59 AM 11/02/2023    9:24 AM 09/28/2023    2:30 PM  GAD 7 : Generalized Anxiety Score  Nervous, Anxious, on Edge 0 0 0 0  Control/stop worrying 0 0 0 0  Worry too much - different things 0 0 0 0  Trouble relaxing 0 0 0 0  Restless 0 0 0 0  Easily annoyed or irritable 0 0 1 1  Afraid - awful might happen 0 0 0 0  Total GAD 7 Score 0 0 1 1    Social History[1]  Review of Systems Per HPI unless specifically indicated above     Objective:    BP 122/80 (BP Location: Right Arm, Patient Position: Sitting, Cuff Size: Normal)   Pulse 87   Ht 5' 5 (1.651 m)   Wt 192 lb (87.1 kg)   LMP 12/22/1999   SpO2 97%   BMI 31.95 kg/m   Wt Readings from Last 3 Encounters:  12/06/24 192 lb (87.1 kg)  11/13/24 197 lb 4 oz (89.5 kg)  11/09/24 196 lb 9.6 oz (89.2 kg)    Physical Exam Vitals and nursing note reviewed.  Constitutional:      General: She is not in acute distress.    Appearance: She is well-developed. She is not diaphoretic.     Comments: Well-appearing, comfortable, cooperative  HENT:     Head: Normocephalic and atraumatic.  Eyes:     General:        Right eye: No discharge.        Left eye: No discharge.     Conjunctiva/sclera: Conjunctivae normal.  Neck:     Thyroid : No thyromegaly.  Cardiovascular:     Rate and Rhythm: Normal rate and regular rhythm.     Heart sounds: Normal heart sounds. No murmur heard. Pulmonary:     Effort: Pulmonary effort is normal. No respiratory distress.     Breath sounds: Normal breath sounds. No wheezing or rales.  Musculoskeletal:        General: Normal range of motion.     Cervical back: Normal range of motion and neck supple.  Lymphadenopathy:     Cervical: No cervical adenopathy.  Skin:    General: Skin is warm and dry.     Findings: No erythema or rash.  Neurological:     Mental Status: She is alert and oriented to person, place, and time.  Psychiatric:        Behavior: Behavior normal.     Comments: Well  groomed, good eye contact, normal speech and thoughts     Results for orders placed or performed in visit on 12/06/24  POC Covid19/Flu A&B Antigen   Collection Time: 12/06/24  8:55 AM  Result Value Ref Range   Influenza A Antigen, POC Negative Negative   Influenza B Antigen, POC Negative Negative   Covid Antigen, POC Negative Negative   *Note: Due to a large number of results and/or encounters for the requested time period, some results have not been displayed. A complete set of results can be found in Results Review.      Assessment & Plan:   Problem List Items Addressed This Visit     Centrilobular emphysema (HCC)   Other Visit Diagnoses       Chronic sinusitis with recurrent bronchitis    -  Primary   Relevant Orders   DG Chest 2 View (Completed)   POC Covid19/Flu A&B Antigen (Completed)        Chronic sinusitis with recurrent bronchitis Persistent congestion and cough for a month. Low likelihood of viral infections like COVID-19, influenza, or RSV. Possible residual effects of past infection or underlying lung issues. Concern about contagion. - Ordered COVID-19 and influenza swabs. - Ordered chest x-ray. - negative for pneumonia - Discussed potential pulmonologist referral if symptoms persist.  Centrilobular emphysema Symptoms may be worsened by emphysema. No significant respiratory distress. Declined daily inhalers due to fatigue with current regimen. - Ordered chest x-ray. - Discussed daily inhalers for COPD management, declined.        Orders Placed This Encounter  Procedures   DG Chest 2 View    Standing Status:   Future    Number of Occurrences:   1    Expiration Date:   12/06/2025    Reason for Exam (SYMPTOM  OR DIAGNOSIS REQUIRED):   recurrent bronchitis for 1 month, history copd    Preferred imaging location?:   [facility]   POC Covid19/Flu A&B Antigen    No orders of the defined types were placed in this encounter.   Follow up plan: Return  if symptoms worsen or fail to improve.  Marsa Officer, DO [facility] 12/06/2024, 8:38 AM     [1]  Social History Tobacco Use   Smoking status: Former    Current packs/day: 0.00    Average packs/day: 1 pack/day for  30.0 years (30.0 ttl pk-yrs)    Types: Cigarettes    Start date: 12/12/1983    Quit date: 12/11/2013    Years since quitting: 10.9   Smokeless tobacco: Former  Building Services Engineer status: Never Used  Substance Use Topics   Alcohol use: Not Currently   Drug use: No   "

## 2024-12-06 NOTE — Patient Instructions (Addendum)
 Thank you for coming to the office today.  Unlikely to still be contagious at this point  COVID Flu swab  Chest X-ray today  Ultimately I do think the answer is COPD Emphysema. So we can refer to [facility] if interested Consider daily inhaler for COPD if you need as well that is the other option for treatment.  Please schedule a Follow-up Appointment to: Return if symptoms worsen or fail to improve.  If you have any other questions or concerns, please feel free to call the office or send a message through MyChart. You may also schedule an earlier appointment if necessary.  Additionally, you may be receiving a survey about your experience at our office within a few days to 1 week by e-mail or mail. We value your feedback.  Marsa Officer, DO [facility], [place]

## 2024-12-07 LAB — POC COVID19/FLU A&B COMBO
Covid Antigen, POC: NEGATIVE
Influenza A Antigen, POC: NEGATIVE
Influenza B Antigen, POC: NEGATIVE

## 2024-12-08 ENCOUNTER — Inpatient Hospital Stay: Admitting: Oncology

## 2024-12-08 ENCOUNTER — Encounter: Payer: Self-pay | Admitting: Oncology

## 2024-12-08 ENCOUNTER — Inpatient Hospital Stay: Attending: Oncology

## 2024-12-08 ENCOUNTER — Inpatient Hospital Stay

## 2024-12-08 VITALS — BP 138/58 | HR 78 | Temp 98.0°F | Resp 18 | Ht 65.0 in | Wt 193.0 lb

## 2024-12-08 DIAGNOSIS — C50919 Malignant neoplasm of unspecified site of unspecified female breast: Secondary | ICD-10-CM

## 2024-12-08 LAB — CBC WITH DIFFERENTIAL (CANCER CENTER ONLY)
Abs Immature Granulocytes: 0.03 K/uL (ref 0.00–0.07)
Basophils Absolute: 0.1 K/uL (ref 0.0–0.1)
Basophils Relative: 1 %
Eosinophils Absolute: 0.1 K/uL (ref 0.0–0.5)
Eosinophils Relative: 2 %
HCT: 30.2 % — ABNORMAL LOW (ref 36.0–46.0)
Hemoglobin: 9.9 g/dL — ABNORMAL LOW (ref 12.0–15.0)
Immature Granulocytes: 1 %
Lymphocytes Relative: 27 %
Lymphs Abs: 1.3 K/uL (ref 0.7–4.0)
MCH: 35 pg — ABNORMAL HIGH (ref 26.0–34.0)
MCHC: 32.8 g/dL (ref 30.0–36.0)
MCV: 106.7 fL — ABNORMAL HIGH (ref 80.0–100.0)
Monocytes Absolute: 0.5 K/uL (ref 0.1–1.0)
Monocytes Relative: 10 %
Neutro Abs: 2.9 K/uL (ref 1.7–7.7)
Neutrophils Relative %: 59 %
Platelet Count: 170 K/uL (ref 150–400)
RBC: 2.83 MIL/uL — ABNORMAL LOW (ref 3.87–5.11)
RDW: 14.3 % (ref 11.5–15.5)
WBC Count: 4.9 K/uL (ref 4.0–10.5)
nRBC: 0 % (ref 0.0–0.2)

## 2024-12-08 LAB — CMP (CANCER CENTER ONLY)
ALT: 37 U/L (ref 0–44)
AST: 38 U/L (ref 15–41)
Albumin: 4.3 g/dL (ref 3.5–5.0)
Alkaline Phosphatase: 84 U/L (ref 38–126)
Anion gap: 13 (ref 5–15)
BUN: 22 mg/dL (ref 8–23)
CO2: 23 mmol/L (ref 22–32)
Calcium: 10.1 mg/dL (ref 8.9–10.3)
Chloride: 107 mmol/L (ref 98–111)
Creatinine: 1.73 mg/dL — ABNORMAL HIGH (ref 0.44–1.00)
GFR, Estimated: 31 mL/min — ABNORMAL LOW
Glucose, Bld: 142 mg/dL — ABNORMAL HIGH (ref 70–99)
Potassium: 4.9 mmol/L (ref 3.5–5.1)
Sodium: 142 mmol/L (ref 135–145)
Total Bilirubin: 0.3 mg/dL (ref 0.0–1.2)
Total Protein: 7.1 g/dL (ref 6.5–8.1)

## 2024-12-08 MED ORDER — FULVESTRANT 250 MG/5ML IM SOSY
500.0000 mg | PREFILLED_SYRINGE | Freq: Once | INTRAMUSCULAR | Status: AC
  Administered 2024-12-08: 500 mg via INTRAMUSCULAR
  Filled 2024-12-08: qty 10

## 2024-12-08 NOTE — Progress Notes (Signed)
 Patient has been having sinus issues, has already taken a Z pack. She is wanting to know if she can get the lung cancer screening test done here instead of [place]. She is also wanting to know if the lung cancer screening and the upcoming pet scan be done at same time?

## 2024-12-08 NOTE — Progress Notes (Signed)
 " [facility]  [phone]  [phone]     Cassidy Norris DOB: 1951/09/06  MR#: [id number]  RDW#:[id number]  Patient Care Team: Edman Marsa PARAS, DO as PCP - General (Family Medicine) Darron Deatrice LABOR, MD as PCP - Cardiology (Cardiology) Skeet Juliene SAUNDERS, DO as Consulting Physician (Neurology) Jacobo Cassidy PARAS, MD as Consulting Physician (Oncology) Maurie Rayfield BIRCH, RN as Oncology Nurse Navigator Mevelyn BIRCH Bathe, OD (Optometry) Delles, Sharyle LABOR, RPH-CPP as Pharmacist  CHIEF COMPLAINT: Recurrent, metastatic ER/PR, HER2 negative invasive carcinoma of the breast.    INTERVAL HISTORY: Patient returns to clinic today for repeat laboratory, further evaluation, and continuation of dose reduced Kisqali  and fulvestrant .  She continues to feel well and remains asymptomatic.  Since dose reducing Kisqali  she no longer complains of any nausea or fatigue.  She denies any pain.  She has no new neurologic complaints.  She denies any recent fevers or illnesses.  She has a fair appetite and denies weight loss.  She has no chest pain, shortness of breath, cough, or hemoptysis.  She denies any nausea, vomiting, constipation, or diarrhea.  She has no urinary complaints.  Patient offers no specific complaints today.  REVIEW OF SYSTEMS:   Review of Systems  Constitutional: Negative.  Negative for fever, malaise/fatigue and weight loss.  Respiratory: Negative.  Negative for cough, hemoptysis and shortness of breath.   Cardiovascular: Negative.  Negative for chest pain and leg swelling.  Gastrointestinal: Negative.  Negative for abdominal pain, nausea and vomiting.  Genitourinary: Negative.  Negative for dysuria and flank pain.  Musculoskeletal:  Negative for joint pain and myalgias.  Skin: Negative.  Negative for rash.  Neurological: Negative.  Negative for dizziness, sensory change, focal weakness, weakness and headaches.  Psychiatric/Behavioral: Negative.  The  patient is not nervous/anxious.     As per HPI. Otherwise, a complete review of systems is negative.  ONCOLOGY HISTORY: Oncology History Overview Note  1. Bilateral carcinoma of breast, February, 2015. Right breast status post radical mastectomy. T1 C. N0M0 (isolated tumor cells in one lymph node) multifocal invasive cancer. Stage IC, Left breast, Status post radical mastectomy, T2, N1, M0 tumor. Stage II, RIght breast. Both tumors are estrogen receptor positive.  Progesterone receptor positive.  HER-2 receptor negative by FISH. Negative for BRCA mutation.  2. Started on Adriamycin and Cytoxan on March 01, 2014. Last cycle of Cytoxan and Adriamycin on May 01, 2014. Patient has finished last chemotherapy on September 9. (12 th dose was omitted because of neuropathy) 3.  Taking tamoxifen .  Patient had a poor tolerance to letrozole.(October, 2015) 4.  Patient is taking letrozole since November of 2015 5.  May, 2016 Letrozole has been put on hold because of bony pains 6.  Started on Aromasin  from July of 2016    History of bilateral breast cancer (Resolved)  11/29/2013 Initial Diagnosis   Breast cancer bilateral, multifocal ER/PR pos, Her 2 neg,.     PAST MEDICAL HISTORY: Past Medical History:  Diagnosis Date   Actinic keratosis    Anemia    Arthritis not really diagnosis but have pain   B12 deficiency 07/23/2016   Will check levels to determine if injections are needed again. Await results. Recent CBC at Onc WNL   Blood transfusion without reported diagnosis 1983 had a miscarriage/dnc   Cancer (HCC) 12/30/2013   Multifocal disease: pT1c,m, N0(isolated tumor cells) Er/ PR positive, Her 2 negative.   Cancer (HCC) 01/09/2014   Invasive lobular carcinoma, 2.2 cm,  T2,N1 ER/PR positive, HER-2/neu negative   Cataract 2014?   Centrilobular emphysema (HCC) 10/27/2021   CKD (chronic kidney disease), stage IV (HCC) 07/13/2023   Complication of anesthesia 03/06/2022   Prolonged metabolism  of Rocuronium  during case.   Diabetes mellitus without complication (HCC)    Diffuse cystic mastopathy    Erosive esophagitis    Essential hypertension, benign 09/24/2017   Family history of malignant neoplasm of gastrointestinal tract 2012   Fractured elbow 08/28/2014   Gastroesophageal reflux disease    Hx of metabolic acidosis with increased anion gap    Increased heart rate    Multiple sclerosis    Neuromuscular disorder (HCC)    neuropathy in bil feet - left is worse.   Obesity, unspecified    Peripheral neuropathy    Personal history of tobacco use, presenting hazards to health    Restless leg syndrome    Sleep apnea    uses CPAP   Special screening for malignant neoplasms, colon    Squamous cell carcinoma of skin 08/24/2022   SCCIS - scalp, ED&C   Squamous cell carcinoma of skin 07/20/2023   SCC IS right of midline ant scalp post, tx with ED&C   Squamous cell carcinoma of skin 07/20/2023   SCC IS right of midline ant scalp ant, tx with ED&C    PAST SURGICAL HISTORY: Past Surgical History:  Procedure Laterality Date   BREAST BIOPSY Right 1973,2001   BREAST BIOPSY Right 11/07/2013   INVASIVE MAMMARY CARCINOMA , ER/PR positive Her2 negative   BREAST BIOPSY Left 11/27/2013   invasive lobular and DCIS   BREAST SURGERY Bilateral 01/09/2014   mastectomy   cataract surgery Left 08/13/2022   CHOLECYSTECTOMY     COLONOSCOPY  2010   Dr. Dellie   COLONOSCOPY WITH PROPOFOL  N/A 06/11/2015   Procedure: COLONOSCOPY WITH PROPOFOL ;  Surgeon: Louanne KANDICE Dellie, MD;  Location: [facility] ENDOSCOPY;  Service: Endoscopy;  Laterality: N/A;   COLONOSCOPY WITH PROPOFOL  N/A 12/23/2021   Procedure: COLONOSCOPY WITH PROPOFOL ;  Surgeon: Unk Corinn Skiff, MD;  Location: [facility] ENDOSCOPY;  Service: Gastroenterology;  Laterality: N/A;   DILATATION & CURETTAGE/HYSTEROSCOPY WITH MYOSURE N/A 07/12/2018   Procedure: DILATATION & CURETTAGE/HYSTEROSCOPY WITH MYOSURE WITH ENDOMETRIAL POLYPECTOMY;   Surgeon: Leonce Garnette BIRCH, MD;  Location: [facility] ORS;  Service: Gynecology;  Laterality: N/A;   DILATION AND CURETTAGE OF UTERUS  1983   ESOPHAGOGASTRODUODENOSCOPY N/A 12/23/2021   Procedure: ESOPHAGOGASTRODUODENOSCOPY (EGD);  Surgeon: Unk Corinn Skiff, MD;  Location: [facility] ENDOSCOPY;  Service: Gastroenterology;  Laterality: N/A;   JOINT REPLACEMENT Left 11/23/2015   right  2023   KNEE ARTHROPLASTY Right 03/06/2022   Procedure: COMPUTER ASSISTED TOTAL KNEE ARTHROPLASTY;  Surgeon: Mardee Lynwood SQUIBB, MD;  Location: [facility] ORS;  Service: Orthopedics;  Laterality: Right;   LAPAROSCOPIC BILATERAL SALPINGO OOPHERECTOMY N/A 08/04/2023   Procedure: LAPAROSCOPIC BILATERAL SALPINGO OOPHORECTOMY;  Surgeon: Mancil Barter, MD;  Location: [facility] ORS;  Service: Gynecology;  Laterality: N/A;   POLYPECTOMY  1989   PORTA CATH REMOVAL     PORTACATH PLACEMENT  2015   SKIN BIOPSY     on scalp   SPINE SURGERY  09/14/2014   Ruptured disk L4- L5   TOTAL KNEE ARTHROPLASTY Left 11/22/2015   Procedure: TOTAL KNEE ARTHROPLASTY;  Surgeon: Toribio Silos, MD;  Location: [facility] OR;  Service: Orthopedics;  Laterality: Left;   TUBAL LIGATION     WISDOM TOOTH EXTRACTION  2006    FAMILY HISTORY Family History  Problem Relation Age of Onset  Lung cancer Mother 23   COPD Mother    Colon cancer Father 43   Stomach cancer Father 36   Hypertension Sister    Breast cancer Maternal Aunt        dx over 65   Breast cancer Paternal Aunt        dx 42s   Breast cancer Paternal Aunt        dx 21s   Rectal cancer Paternal Uncle        dx 45s   Rectal cancer Paternal Uncle        dx 14s   Diabetes Maternal Grandmother    Pancreatic cancer Paternal Grandfather    Rectal cancer Cousin        dx 18s-60s   Rectal cancer Cousin        dx >50    GYNECOLOGIC HISTORY:  Patient's last menstrual period was 12/22/1999.     ADVANCED DIRECTIVES:    HEALTH MAINTENANCE: Social History   Tobacco Use   Smoking status: Former     Current packs/day: 0.00    Average packs/day: 1 pack/day for 30.0 years (30.0 ttl pk-yrs)    Types: Cigarettes    Start date: 12/12/1983    Quit date: 12/11/2013    Years since quitting: 11.0   Smokeless tobacco: Former  Building Services Engineer status: Never Used  Substance Use Topics   Alcohol use: Not Currently   Drug use: No     Colonoscopy:  PAP:  Bone density:  Mammogram:  No Known Allergies  Current Outpatient Medications  Medication Sig Dispense Refill   acetaminophen  (TYLENOL ) 500 MG tablet Take 1,000 mg by mouth 2 (two) times daily as needed for moderate pain or headache.     Alirocumab  (PRALUENT ) 75 MG/ML SOAJ Inject 1 mL (75 mg total) into the skin every 14 (fourteen) days. 2 mL 2   baclofen  (LIORESAL ) 10 MG tablet TAKE 1 TABLET BY MOUTH THREE TIMES A DAY AS NEEDED FOR MUSCLE SPASM 270 tablet 1   Blood Glucose Monitoring Suppl (ONETOUCH VERIO FLEX SYSTEM) w/Device KIT Use to check blood sugar up to twice daily as directed 1 kit 0   calcium  carbonate (OSCAL) 1500 (600 Ca) MG TABS tablet Take by mouth 2 (two) times daily with a meal.     dapagliflozin propanediol (FARXIGA) 10 MG TABS tablet Take 10 mg by mouth daily.     ezetimibe  (ZETIA ) 10 MG tablet Take 1 tablet (10 mg total) by mouth daily. 90 tablet 3   fluticasone  (FLONASE ) 50 MCG/ACT nasal spray PLACE 2 SPRAYS INTO BOTH NOSTRILS AT BEDTIME. 48 g 1   fulvestrant  (FASLODEX ) 250 MG/5ML injection Inject 500 mg into the muscle every 30 (thirty) days. One injection each buttock over 1-2 minutes. Warm prior to use.     glucose blood (ONETOUCH VERIO) test strip Use to check blood sugar up to twice daily 200 each 3   icosapent  Ethyl (VASCEPA ) 1 g capsule Take 2 capsules (2 g total) by mouth 2 (two) times daily with a meal. 120 capsule 5   Lancets (ONETOUCH DELICA PLUS LANCET30G) MISC Use to check blood sugar up to twice daily 200 each 3   lisinopril  (ZESTRIL ) 5 MG tablet Take 1 tablet (5 mg total) by mouth daily. 90 tablet 1    loperamide  (IMODIUM ) 2 MG capsule TAKE 2 TABLETS BY MOUTH 4 TIMES A DAY DAILY AS NEEDED FOR DIARRHEA OR LOOSE STOOLS. 30 capsule 1   metFORMIN  (GLUCOPHAGE ) 500 MG  tablet Take 1 tablet (500 mg total) by mouth 2 (two) times daily with a meal. Renal dosing.     metoprolol  tartrate (LOPRESSOR ) 25 MG tablet TAKE 1/2 TABLET TWICE A DAY BY MOUTH 90 tablet 3   omeprazole  (PRILOSEC) 40 MG capsule TAKE 1 CAPSULE BY MOUTH TWICE A DAY 180 capsule 1   ondansetron  (ZOFRAN ) 8 MG tablet TAKE 1 TABLET BY MOUTH EVERY 8 HOURS AS NEEDED FOR NAUSEA AND VOMITING 20 tablet 0   ribociclib  succ (KISQALI  400MG  DAILY DOSE) 200 MG Therapy Pack Take 2 tablets (400 mg total) by mouth daily. Take for 21 days on, 7 days off, repeat every 28 days. 42 tablet 1   sodium chloride  (OCEAN) 0.65 % SOLN nasal spray Place 1 spray into both nostrils as needed for congestion.     traZODone  (DESYREL ) 50 MG tablet Take 1 tablet (50 mg total) by mouth at bedtime as needed. for sleep 90 tablet 1   venlafaxine  XR (EFFEXOR -XR) 75 MG 24 hr capsule Take 1 capsule (75 mg total) by mouth daily with breakfast. 90 capsule 3   vitamin B-12 (CYANOCOBALAMIN ) 1000 MCG tablet Take 2,500 mcg by mouth every Monday, Wednesday, and Friday.     Vitamin D , Cholecalciferol , 50 MCG (2000 UT) CAPS Take 2,000 Units by mouth every Monday, Wednesday, and Friday.     benzonatate  (TESSALON ) 100 MG capsule Take 1 capsule (100 mg total) by mouth 3 (three) times daily as needed for cough. (Patient not taking: Reported on 12/08/2024) 30 capsule 0   diclofenac  Sodium (VOLTAREN ) 1 % GEL Apply 2 g topically 3 (three) times daily as needed. (Patient not taking: Reported on 12/08/2024) 100 g 2   No current facility-administered medications for this visit.   Facility-Administered Medications Ordered in Other Visits  Medication Dose Route Frequency Provider Last Rate Last Admin   fulvestrant  (FASLODEX ) injection 500 mg  500 mg Intramuscular Once Jacobo Cassidy PARAS, MD         OBJECTIVE: BP (!) 138/58 (BP Location: Left Arm, Patient Position: Sitting, Cuff Size: Normal)   Pulse 78   Temp 98 F (36.7 C)   Resp 18   Ht 5' 5 (1.651 m)   Wt 193 lb (87.5 kg)   LMP 12/22/1999   SpO2 100%   BMI 32.12 kg/m    Body mass index is 32.12 kg/m.    ECOG FS:0 - Asymptomatic  General: Well-developed, well-nourished, no acute distress. Eyes: Pink conjunctiva, anicteric sclera. HEENT: Normocephalic, moist mucous membranes. Lungs: No audible wheezing or coughing. Heart: Regular rate and rhythm. Abdomen: Soft, nontender, no obvious distention. Musculoskeletal: No edema, cyanosis, or clubbing. Neuro: Alert, answering all questions appropriately. Cranial nerves grossly intact. Skin: No rashes or petechiae noted. Psych: Normal affect.  LAB RESULTS:  Appointment on 12/08/2024  Component Date Value Ref Range Status   WBC Count 12/08/2024 4.9  4.0 - 10.5 K/uL Final   RBC 12/08/2024 2.83 (L)  3.87 - 5.11 MIL/uL Final   Hemoglobin 12/08/2024 9.9 (L)  12.0 - 15.0 g/dL Final   HCT 98/83/7973 30.2 (L)  36.0 - 46.0 % Final   MCV 12/08/2024 106.7 (H)  80.0 - 100.0 fL Final   MCH 12/08/2024 35.0 (H)  26.0 - 34.0 pg Final   MCHC 12/08/2024 32.8  30.0 - 36.0 g/dL Final   RDW 98/83/7973 14.3  11.5 - 15.5 % Final   Platelet Count 12/08/2024 170  150 - 400 K/uL Final   nRBC 12/08/2024 0.0  0.0 - 0.2 %  Final   Neutrophils Relative % 12/08/2024 59  % Final   Neutro Abs 12/08/2024 2.9  1.7 - 7.7 K/uL Final   Lymphocytes Relative 12/08/2024 27  % Final   Lymphs Abs 12/08/2024 1.3  0.7 - 4.0 K/uL Final   Monocytes Relative 12/08/2024 10  % Final   Monocytes Absolute 12/08/2024 0.5  0.1 - 1.0 K/uL Final   Eosinophils Relative 12/08/2024 2  % Final   Eosinophils Absolute 12/08/2024 0.1  0.0 - 0.5 K/uL Final   Basophils Relative 12/08/2024 1  % Final   Basophils Absolute 12/08/2024 0.1  0.0 - 0.1 K/uL Final   Immature Granulocytes 12/08/2024 1  % Final   Abs Immature  Granulocytes 12/08/2024 0.03  0.00 - 0.07 K/uL Final   Performed at [facility], [address] [place], [place] [address]  Office Visit on 12/06/2024  Component Date Value Ref Range Status   Influenza A Antigen, POC 12/06/2024 Negative  Negative Final   Influenza B Antigen, POC 12/06/2024 Negative  Negative Final   Covid Antigen, POC 12/06/2024 Negative  Negative Final    STUDIES: DG Chest 2 View Result Date: 12/06/2024 EXAM: 2 VIEW(S) XRAY OF THE CHEST 12/06/2024 09:12:12 AM COMPARISON: 11/12/2015 CLINICAL HISTORY: The patient has had recurrent bronchitis for one month and has a history of COPD. FINDINGS: LUNGS AND PLEURA: No focal pulmonary opacity. No pleural effusion. No pneumothorax. HEART AND MEDIASTINUM: Atherosclerotic calcification of aorta. No acute abnormality of the cardiac and mediastinal silhouettes. BONES AND SOFT TISSUES: Endplate spur formation thoracic spine with mild dextroconvex thoracic scoliosis. No acute osseous abnormality. IMPRESSION: 1. No acute cardiopulmonary abnormality. 2. Atherosclerotic calcification of the aorta. 3. Thoracic spine degenerative endplate spurring with mild dextroconvex thoracic scoliosis. Electronically signed by: Ryan Salvage MD 12/06/2024 09:42 AM EST RP Workstation: [id number]    ONCOLOGY HISTORY:  Bilateral adenocarcinoma of the breast. Patient is status post bilateral mastectomy in February 2015. She also received adjuvant chemotherapy with Adriamycin, Cytoxan, and Taxol completing treatment in approximately October 2015. She then initiated letrozole, but could not tolerate secondary to leg pain and was switched to Aromasin .  Patient then switched to tamoxifen  in October 2019 secondary to cost.  Patient subsequently discontinued tamoxifen  and did not complete the recommended 5 years of treatment.  PET scan results from December 26, 2021 reviewed independently with hypermetabolic left supraclavicular, subpectoral, and axillary  lymphadenopathy consistent with nodal recurrence of patient's history of breast cancer.  No distant metastases were identified.  Biopsy confirmed recurrent disease.    ASSESSMENT: Recurrent, metastatic ER/PR, HER2 negative invasive carcinoma of the breast.   PLAN:    Recurrent, metastatic ER/PR, HER2 negative invasive carcinoma of the breast: See oncology history as above. Her most recent PET scan on August 02, 2024 reviewed independently with no evidence of active malignancy.  Her initial CA 27-29 initially was 260.1.  Her most recent result is 39.1, today's result is pending.  Of note, patient also had metastatic breast cancer in her surgical specimen from her oophorectomy.  Continue dose reduced Kisqali  400 mg 21 days with 7 days off.  Proceed with fulvestrant  today.  Return to clinic in 4 weeks for laboratory work and evaluation by clinical pharmacy.  Patient within return to clinic in 8 weeks for further evaluation and continuation of treatment.   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  Follow-up with gynecology oncology as needed. Multiple sclerosis: Chronic and unchanged.  Continue evaluation and treatment per neurology.  Recent  MRI of the brain did not reveal any new lesions. Renal insufficiency: Chronic and unchanged.  Patient's GFR is 31 today.  Dose reduce Kisqali  as above.  Monitor if GFR remains persistently less than 30, may need to adjust Kisqali  dosing. Anemia: Chronic and unchanged.  Patient's hemoglobin is 9.9 today. Macrocytosis: Likely secondary to underlying treatments. Reflux: Patient does not complain of this today.  Continue omeprazole .   Nausea and vomiting: Patient does not complain of this today.  Continue ondansetron  as needed.  Patient expressed understanding and was in agreement with this plan. She also understands that She can call clinic at any time with any questions, concerns, or complaints.   Breast cancer bilateral, multifocal ER/PR  pos, Her 2 neg.   Staging form: Breast, AJCC 7th Edition     Clinical: Stage IIB (T2, N1, M0) - Signed by Vester Clayman, MD on 04/22/2015   Cassidy JINNY Reusing, MD   12/08/2024 10:11 AM      "

## 2024-12-09 LAB — CANCER ANTIGEN 27.29: CA 27.29: 43.5 U/mL — ABNORMAL HIGH (ref 0.0–38.6)

## 2024-12-21 ENCOUNTER — Other Ambulatory Visit: Payer: Self-pay | Admitting: Oncology

## 2024-12-21 ENCOUNTER — Other Ambulatory Visit: Payer: Self-pay

## 2024-12-21 DIAGNOSIS — C50919 Malignant neoplasm of unspecified site of unspecified female breast: Secondary | ICD-10-CM

## 2024-12-22 ENCOUNTER — Other Ambulatory Visit (HOSPITAL_COMMUNITY): Payer: Self-pay

## 2024-12-22 ENCOUNTER — Encounter (INDEPENDENT_AMBULATORY_CARE_PROVIDER_SITE_OTHER): Payer: Self-pay

## 2024-12-25 ENCOUNTER — Other Ambulatory Visit: Payer: Self-pay

## 2024-12-25 ENCOUNTER — Other Ambulatory Visit: Admitting: Pharmacist

## 2024-12-25 ENCOUNTER — Other Ambulatory Visit: Payer: Self-pay | Admitting: Oncology

## 2024-12-25 ENCOUNTER — Other Ambulatory Visit (HOSPITAL_COMMUNITY): Payer: Self-pay

## 2024-12-25 DIAGNOSIS — Z7984 Long term (current) use of oral hypoglycemic drugs: Secondary | ICD-10-CM

## 2024-12-25 DIAGNOSIS — N184 Chronic kidney disease, stage 4 (severe): Secondary | ICD-10-CM

## 2024-12-25 DIAGNOSIS — C50919 Malignant neoplasm of unspecified site of unspecified female breast: Secondary | ICD-10-CM

## 2024-12-25 DIAGNOSIS — E1169 Type 2 diabetes mellitus with other specified complication: Secondary | ICD-10-CM

## 2024-12-25 DIAGNOSIS — I700 Atherosclerosis of aorta: Secondary | ICD-10-CM

## 2024-12-25 DIAGNOSIS — E1122 Type 2 diabetes mellitus with diabetic chronic kidney disease: Secondary | ICD-10-CM

## 2024-12-25 NOTE — Progress Notes (Unsigned)
 "  Cardiology Clinic Note   Date: 12/25/2024 ID: Cassidy Norris 1951/02/12, MRN [id number]  Primary Cardiologist:  Deatrice Cage, MD  Chief Complaint   Cassidy Norris is a 74 y.o. female who presents to the clinic today for ***  Patient Profile   Cassidy Norris is followed by *** for the history outlined below.       Past medical history significant for: DOE. Echo 11/15/2023: EF 55 to 60%.  No RWMA.  Normal diastolic parameters.  Normal RV size/function.  Mild MR.*** Echo 07/11/2024: EF 60 to 65%.  No RWMA.  Indeterminate diastolic filling due to e-a fusion.  Normal global strain.  Normal RV size/function.  No significant valvular abnormalities. Aortic atherosclerosis. Palpitations/tachycardia. 7-day ZIO 11/05/2023: HR 62 to 245 bpm, average 96 bpm.  Predominantly sinus rhythm.  15 runs of SVT fastest 5 beats max rate 245 bpm, longest 14.1 seconds average rate 107 bpm.  Occasional PACs (4.2% burden). Hypertension. Hyperlipidemia. Lipid panel 11/01/2024: HDL 46, TG 477, total 189. Direct LDL and triglycerides 12/25/2024: LDL***, TG***. GERD. T2DM. COPD.  MS. CKD stage IV. Multiple sclerosis. Breast cancer. S/p bilateral mastectomy followed by chemotherapy completed in September 2015. 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 Former tobacco abuse. Anemia.  In summary, patient was first evaluated by Dr. Cage on 04/19/2015 for evaluation of tachycardia.  Patient reported noting sinus tachycardia in March.  She reported worsening exertional dyspnea with no orthopnea or PND and trace lower extremity edema.  Echo in June 2016 showed EF 55 to 60%, no RWMA, Grade I DD, mild LAE, PA pressure upper limits of normal, no significant valvular abnormalities.  Patient was started on small dose of carvedilol  with overall improvement in heart rate.  Patient reestablish care with Dr. Cage on 10/19/2023 for sinus tachycardia.  She reported  being on carvedilol  for a few years then switched to lisinopril  for renal protection secondary to her diabetes.  She reported a 6 to 9-month history of intermittent sinus tachycardia especially when at the doctor's office.  She reported relatively stable DOE.  She had an echo which showed normal LV/RV function.  7-day ZIO showed 15 runs of SVT longest lasting 14 seconds.  Upon follow-up in February 2025 she reported improvement in symptoms on a small dose of metoprolol  twice daily.  Patient was last seen in the office by me on 06/29/2024 for evaluation of DOE.  She reported a 3 to 100-month history of dyspnea with minimal exertion causing her to stop to catch her breath before resuming activity.  She denied associated chest pain or palpitations.  Noncardiac issues contributing to dyspnea were discussed including anemia and deconditioning.  She underwent echo which demonstrated normal LV/RV function.  It was recommended she begin a seated exercise program.     History of Present Illness    Today, patient ***  DOE Echo August 2025 demonstrated normal LV/RV function, no significant valvular abnormalities. Patient*** -***.    Palpitations/PSVT 7-day ZIO December 2024 showed HR 62 to 245 bpm, average 96 bpm, predominantly sinus rhythm, 15 runs of SVT fastest 5 beats max rate 245 bpm, longest 14.1 seconds average rate 107 bpm, occasional PACs.  Patient*** - Continue metoprolol . Take extra 1/2 tablet of metoprolol  for prolonged palpitations.    Hypertension BP today***. No report of headache or dizziness.  - Continue lisinopril , metoprolol .  Hyperlipidemia Direct LDL***, TG***February 2026,***. - Continue Praluent , Zetia , Vascepa . - Continue to follow with  PCP.  ROS: All other systems reviewed and are otherwise negative except as noted in History of Present Illness.  EKGs/Labs Reviewed        12/08/2024: ALT 37; AST 38; BUN 22; Creatinine 1.73; Potassium 4.9; Sodium 142   12/08/2024: Hemoglobin  9.9; WBC Count 4.9   11/01/2024: TSH 0.98   No results found for requested labs within last 365 days.  ***  Risk Assessment/Calculations    {Does this patient have ATRIAL FIBRILLATION?[phone]} No BP recorded.  {Refresh Note OR Click here to enter BP  :1}***        Physical Exam    VS:  LMP 12/22/1999  , BMI There is no height or weight on file to calculate BMI.  GEN: Well nourished, well developed, in no acute distress. Neck: No JVD or carotid bruits. Cardiac: *** RRR. *** No murmur. No rubs or gallops.   Respiratory:  Respirations regular and unlabored. Clear to auscultation without rales, wheezing or rhonchi. GI: Soft, nontender, nondistended. Extremities: Radials/DP/PT 2+ and equal bilaterally. No clubbing or cyanosis. No edema ***  Skin: Warm and dry, no rash. Neuro: Strength intact.  Assessment & Plan   ***  Disposition: ***     {Are you ordering a CV Procedure (e.g. stress test, cath, DCCV, TEE, etc)?   Press F2        :[id number]}   Signed, Cassidy Norris. Cassidy Radke, DNP, NP-C  "

## 2024-12-25 NOTE — Progress Notes (Signed)
 Specialty Pharmacy Refill Coordination Note  Cassidy Norris is a 74 y.o. female contacted today regarding refills of specialty medication(s) Ribociclib  Succinate (KISQALI  400mg  Daily Dose)   Patient requested Delivery   Delivery date: 12/29/24   Verified address: [address], [place] [address]   Medication will be filled on: 12/28/24  This fill date is pending response to refill request from provider. Patient is aware and if they have not received fill by intended date they must follow up with pharmacy.

## 2024-12-26 ENCOUNTER — Other Ambulatory Visit: Payer: Self-pay

## 2024-12-26 NOTE — Progress Notes (Signed)
 Specialty Pharmacy Ongoing Clinical Assessment Note  Cassidy Norris is a 75 y.o. female who is being followed by the specialty pharmacy service for RxSp Oncology   Patient's specialty medication(s) reviewed today: Ribociclib  Succinate (KISQALI  400mg  Daily Dose)   Missed doses in the last 4 weeks: 1 (dropped and lost 1 pill)   Patient/Caregiver did not have any additional questions or concerns.   Therapeutic benefit summary: Patient is achieving benefit   Adverse events/side effects summary: No adverse events/side effects (diarrhea has resolved)   Patient's therapy is appropriate to: Continue    Goals Addressed             This Visit's Progress    Slow Disease Progression   On track    Patient is on track. Patient will maintain adherence.  CA 27.29 43.5 as of  12/08/24 PET scan in September showed no evidence of active malignancy        Follow up: 3 months  Silvano LOISE [facility] Special[facility] Pharmacist

## 2024-12-27 ENCOUNTER — Other Ambulatory Visit: Payer: Self-pay

## 2024-12-27 MED ORDER — RIBOCICLIB SUCC (400 MG DOSE) 200 MG PO TBPK
400.0000 mg | ORAL_TABLET | Freq: Every day | ORAL | 1 refills | Status: AC
  Filled 2024-12-27: qty 42, 28d supply, fill #0

## 2024-12-28 ENCOUNTER — Ambulatory Visit: Payer: Medicare Other | Admitting: Neurology

## 2024-12-28 ENCOUNTER — Other Ambulatory Visit: Payer: Self-pay

## 2024-12-29 ENCOUNTER — Ambulatory Visit: Admitting: Student

## 2025-01-04 ENCOUNTER — Inpatient Hospital Stay: Admitting: Pharmacist

## 2025-01-04 ENCOUNTER — Inpatient Hospital Stay

## 2025-01-16 ENCOUNTER — Ambulatory Visit: Admitting: Student

## 2025-01-24 ENCOUNTER — Other Ambulatory Visit

## 2025-02-01 ENCOUNTER — Inpatient Hospital Stay

## 2025-02-01 ENCOUNTER — Inpatient Hospital Stay: Admitting: Oncology

## 2025-02-12 ENCOUNTER — Other Ambulatory Visit

## 2025-05-15 ENCOUNTER — Ambulatory Visit: Admitting: Family Medicine

## 2025-06-29 ENCOUNTER — Ambulatory Visit: Admitting: Neurology

## 2025-07-27 ENCOUNTER — Ambulatory Visit

## 2025-08-01 ENCOUNTER — Ambulatory Visit

## 2025-09-05 ENCOUNTER — Ambulatory Visit: Admitting: Dermatology
